# Patient Record
Sex: Male | Born: 1940 | Race: White | Hispanic: No | Marital: Married | Smoking: Former smoker
Health system: Southern US, Community
[De-identification: ages and names within clinical notes are randomized; demographics above are authoritative.]

## PROBLEM LIST (undated history)

## (undated) DIAGNOSIS — C4491 Basal cell carcinoma of skin, unspecified: Secondary | ICD-10-CM

## (undated) DIAGNOSIS — K279 Peptic ulcer, site unspecified, unspecified as acute or chronic, without hemorrhage or perforation: Secondary | ICD-10-CM

## (undated) DIAGNOSIS — F1910 Other psychoactive substance abuse, uncomplicated: Secondary | ICD-10-CM

## (undated) DIAGNOSIS — Z720 Tobacco use: Secondary | ICD-10-CM

## (undated) DIAGNOSIS — I4891 Unspecified atrial fibrillation: Secondary | ICD-10-CM

## (undated) DIAGNOSIS — F419 Anxiety disorder, unspecified: Secondary | ICD-10-CM

## (undated) DIAGNOSIS — I340 Nonrheumatic mitral (valve) insufficiency: Secondary | ICD-10-CM

## (undated) DIAGNOSIS — I10 Essential (primary) hypertension: Secondary | ICD-10-CM

## (undated) DIAGNOSIS — R0602 Shortness of breath: Secondary | ICD-10-CM

## (undated) DIAGNOSIS — I509 Heart failure, unspecified: Secondary | ICD-10-CM

## (undated) DIAGNOSIS — M1990 Unspecified osteoarthritis, unspecified site: Secondary | ICD-10-CM

## (undated) DIAGNOSIS — I428 Other cardiomyopathies: Secondary | ICD-10-CM

## (undated) DIAGNOSIS — K4090 Unilateral inguinal hernia, without obstruction or gangrene, not specified as recurrent: Secondary | ICD-10-CM

## (undated) DIAGNOSIS — I499 Cardiac arrhythmia, unspecified: Secondary | ICD-10-CM

## (undated) DIAGNOSIS — J189 Pneumonia, unspecified organism: Secondary | ICD-10-CM

## (undated) DIAGNOSIS — S42301A Unspecified fracture of shaft of humerus, right arm, initial encounter for closed fracture: Secondary | ICD-10-CM

## (undated) DIAGNOSIS — C8290 Follicular lymphoma, unspecified, unspecified site: Principal | ICD-10-CM

## (undated) DIAGNOSIS — J449 Chronic obstructive pulmonary disease, unspecified: Secondary | ICD-10-CM

## (undated) HISTORY — DX: Anxiety disorder, unspecified: F41.9

## (undated) HISTORY — PX: ROTATOR CUFF REPAIR: SHX139

## (undated) HISTORY — DX: Tobacco use: Z72.0

## (undated) HISTORY — DX: Other cardiomyopathies: I42.8

## (undated) HISTORY — DX: Unspecified atrial fibrillation: I48.91

## (undated) HISTORY — DX: Unspecified osteoarthritis, unspecified site: M19.90

## (undated) HISTORY — DX: Follicular lymphoma, unspecified, unspecified site: C82.90

## (undated) HISTORY — DX: Unspecified fracture of shaft of humerus, right arm, initial encounter for closed fracture: S42.301A

## (undated) HISTORY — DX: Basal cell carcinoma of skin, unspecified: C44.91

## (undated) HISTORY — DX: Essential (primary) hypertension: I10

## (undated) HISTORY — DX: Other psychoactive substance abuse, uncomplicated: F19.10

## (undated) HISTORY — PX: SKIN CANCER EXCISION: SHX779

## (undated) HISTORY — DX: Pneumonia, unspecified organism: J18.9

## (undated) HISTORY — PX: OTHER SURGICAL HISTORY: SHX169

## (undated) HISTORY — DX: Nonrheumatic mitral (valve) insufficiency: I34.0

## (undated) HISTORY — DX: Unilateral inguinal hernia, without obstruction or gangrene, not specified as recurrent: K40.90

## (undated) HISTORY — PX: HEMATOMA EVACUATION: SHX5118

## (undated) HISTORY — DX: Peptic ulcer, site unspecified, unspecified as acute or chronic, without hemorrhage or perforation: K27.9

---

## 1973-03-24 HISTORY — PX: PATELLA FRACTURE SURGERY: SHX735

## 2004-11-12 ENCOUNTER — Ambulatory Visit (HOSPITAL_COMMUNITY): Admission: RE | Admit: 2004-11-12 | Discharge: 2004-11-12 | Payer: Self-pay | Admitting: Family Medicine

## 2005-01-27 ENCOUNTER — Emergency Department (HOSPITAL_COMMUNITY): Admission: EM | Admit: 2005-01-27 | Discharge: 2005-01-27 | Payer: Self-pay | Admitting: Family Medicine

## 2005-02-02 ENCOUNTER — Inpatient Hospital Stay (HOSPITAL_COMMUNITY): Admission: EM | Admit: 2005-02-02 | Discharge: 2005-02-05 | Payer: Self-pay | Admitting: Emergency Medicine

## 2005-02-04 ENCOUNTER — Encounter (INDEPENDENT_AMBULATORY_CARE_PROVIDER_SITE_OTHER): Payer: Self-pay | Admitting: Internal Medicine

## 2005-02-04 ENCOUNTER — Ambulatory Visit: Payer: Self-pay | Admitting: Internal Medicine

## 2005-03-03 ENCOUNTER — Other Ambulatory Visit: Admission: RE | Admit: 2005-03-03 | Discharge: 2005-03-03 | Payer: Self-pay | Admitting: Dermatology

## 2005-03-06 ENCOUNTER — Ambulatory Visit: Payer: Self-pay | Admitting: Internal Medicine

## 2005-04-03 ENCOUNTER — Other Ambulatory Visit: Admission: RE | Admit: 2005-04-03 | Discharge: 2005-04-03 | Payer: Self-pay | Admitting: Dermatology

## 2005-04-09 ENCOUNTER — Ambulatory Visit (HOSPITAL_COMMUNITY): Admission: RE | Admit: 2005-04-09 | Discharge: 2005-04-09 | Payer: Self-pay | Admitting: Internal Medicine

## 2005-04-09 ENCOUNTER — Ambulatory Visit: Payer: Self-pay | Admitting: Internal Medicine

## 2005-06-18 ENCOUNTER — Ambulatory Visit (HOSPITAL_COMMUNITY): Admission: RE | Admit: 2005-06-18 | Discharge: 2005-06-18 | Payer: Self-pay | Admitting: Orthopaedic Surgery

## 2005-12-05 ENCOUNTER — Ambulatory Visit (HOSPITAL_COMMUNITY): Admission: RE | Admit: 2005-12-05 | Discharge: 2005-12-05 | Payer: Self-pay | Admitting: Orthopaedic Surgery

## 2007-03-25 HISTORY — PX: COLONOSCOPY: SHX174

## 2007-08-19 ENCOUNTER — Ambulatory Visit (HOSPITAL_COMMUNITY): Admission: RE | Admit: 2007-08-19 | Discharge: 2007-08-19 | Payer: Self-pay | Admitting: Family Medicine

## 2007-11-12 ENCOUNTER — Ambulatory Visit (HOSPITAL_COMMUNITY): Admission: RE | Admit: 2007-11-12 | Discharge: 2007-11-12 | Payer: Self-pay | Admitting: Orthopaedic Surgery

## 2008-01-18 ENCOUNTER — Ambulatory Visit (HOSPITAL_COMMUNITY): Admission: RE | Admit: 2008-01-18 | Discharge: 2008-01-18 | Payer: Self-pay | Admitting: Orthopaedic Surgery

## 2008-01-25 ENCOUNTER — Inpatient Hospital Stay (HOSPITAL_COMMUNITY): Admission: RE | Admit: 2008-01-25 | Discharge: 2008-01-27 | Payer: Self-pay | Admitting: Orthopedic Surgery

## 2008-03-24 DIAGNOSIS — J189 Pneumonia, unspecified organism: Secondary | ICD-10-CM

## 2008-03-24 HISTORY — DX: Pneumonia, unspecified organism: J18.9

## 2008-05-11 ENCOUNTER — Encounter (INDEPENDENT_AMBULATORY_CARE_PROVIDER_SITE_OTHER): Payer: Self-pay | Admitting: *Deleted

## 2008-05-11 LAB — CONVERTED CEMR LAB
AST: 12 units/L
Alkaline Phosphatase: 81 units/L
CO2: 24 meq/L
Cholesterol: 197 mg/dL
Creatinine, Ser: 0.97 mg/dL
Glucose, Bld: 101 mg/dL
LDL Cholesterol: 115 mg/dL
Triglycerides: 161 mg/dL

## 2008-07-26 ENCOUNTER — Other Ambulatory Visit: Payer: Self-pay | Admitting: Plastic Surgery

## 2008-07-31 ENCOUNTER — Encounter (INDEPENDENT_AMBULATORY_CARE_PROVIDER_SITE_OTHER): Payer: Self-pay | Admitting: Plastic Surgery

## 2008-07-31 ENCOUNTER — Other Ambulatory Visit: Payer: Self-pay | Admitting: Plastic Surgery

## 2008-08-01 ENCOUNTER — Other Ambulatory Visit: Payer: Self-pay | Admitting: Plastic Surgery

## 2008-08-01 ENCOUNTER — Inpatient Hospital Stay (HOSPITAL_COMMUNITY): Admission: AD | Admit: 2008-08-01 | Discharge: 2008-08-02 | Payer: Self-pay | Admitting: Plastic Surgery

## 2008-09-29 ENCOUNTER — Ambulatory Visit (HOSPITAL_COMMUNITY): Admission: RE | Admit: 2008-09-29 | Discharge: 2008-09-29 | Payer: Self-pay | Admitting: Orthopaedic Surgery

## 2008-10-06 ENCOUNTER — Emergency Department (HOSPITAL_COMMUNITY): Admission: EM | Admit: 2008-10-06 | Discharge: 2008-10-06 | Payer: Self-pay | Admitting: Emergency Medicine

## 2008-10-22 DIAGNOSIS — I4891 Unspecified atrial fibrillation: Secondary | ICD-10-CM

## 2008-10-22 HISTORY — DX: Unspecified atrial fibrillation: I48.91

## 2008-10-30 ENCOUNTER — Ambulatory Visit: Payer: Self-pay | Admitting: Cardiology

## 2008-10-30 ENCOUNTER — Inpatient Hospital Stay (HOSPITAL_COMMUNITY): Admission: AD | Admit: 2008-10-30 | Discharge: 2008-11-02 | Payer: Self-pay | Admitting: Family Medicine

## 2008-10-30 LAB — CONVERTED CEMR LAB
ALT: 13 units/L
AST: 27 units/L
Albumin: 3.9 g/dL
Alkaline Phosphatase: 72 units/L
BUN: 13 mg/dL
Bilirubin, Direct: 0.3 mg/dL
CK-MB: 2.8 ng/mL
CO2: 26 meq/L
Calcium: 9.5 mg/dL
Chloride: 105 meq/L
Creatinine, Ser: 0.87 mg/dL
GFR calc non Af Amer: >60 mL/min
Glomerular Filtration Rate, Af Am: >60 mL/min/{1.73_m2}
Glucose, Bld: 170 mg/dL
HCT: 37.5 %
Hemoglobin: 12.9 g/dL
MCV: 85.2 fL
Platelets: 227 10*3/uL
Potassium: 3.4 meq/L
Sodium: 137 meq/L
TSH: 0.311 microintl units/mL
Total CK: 201 units/L
Total Protein: 7.1 g/dL
Troponin I: 0.05 ng/mL
WBC: 7.1 10*3/uL

## 2008-10-31 ENCOUNTER — Encounter: Payer: Self-pay | Admitting: Cardiology

## 2008-10-31 ENCOUNTER — Encounter (INDEPENDENT_AMBULATORY_CARE_PROVIDER_SITE_OTHER): Payer: Self-pay | Admitting: Family Medicine

## 2008-11-01 ENCOUNTER — Encounter (INDEPENDENT_AMBULATORY_CARE_PROVIDER_SITE_OTHER): Payer: Self-pay | Admitting: *Deleted

## 2008-11-01 LAB — CONVERTED CEMR LAB
Free T4: 0.9 ng/dL
Free T4: 0.9 ng/dL
T3, Free: 2.9 pg/mL
TSH: 0.484 microintl units/mL

## 2008-11-02 ENCOUNTER — Encounter: Payer: Self-pay | Admitting: Cardiology

## 2008-11-02 LAB — CONVERTED CEMR LAB
INR: 1.1
Prothrombin Time: 14.3 s

## 2008-11-06 ENCOUNTER — Ambulatory Visit: Payer: Self-pay

## 2008-11-06 LAB — CONVERTED CEMR LAB: POC INR: 1.1

## 2008-11-08 DIAGNOSIS — J309 Allergic rhinitis, unspecified: Secondary | ICD-10-CM | POA: Insufficient documentation

## 2008-11-08 DIAGNOSIS — I10 Essential (primary) hypertension: Secondary | ICD-10-CM | POA: Insufficient documentation

## 2008-11-09 LAB — CONVERTED CEMR LAB: POC INR: 2.4

## 2008-11-10 ENCOUNTER — Ambulatory Visit: Payer: Self-pay

## 2008-11-13 ENCOUNTER — Ambulatory Visit: Payer: Self-pay

## 2008-11-13 LAB — CONVERTED CEMR LAB: POC INR: 3.5

## 2008-11-22 ENCOUNTER — Ambulatory Visit: Payer: Self-pay | Admitting: Cardiology

## 2008-11-22 ENCOUNTER — Encounter: Payer: Self-pay | Admitting: Cardiology

## 2008-11-22 DIAGNOSIS — Z8711 Personal history of peptic ulcer disease: Secondary | ICD-10-CM

## 2008-11-22 DIAGNOSIS — E079 Disorder of thyroid, unspecified: Secondary | ICD-10-CM | POA: Insufficient documentation

## 2008-11-22 DIAGNOSIS — G4700 Insomnia, unspecified: Secondary | ICD-10-CM

## 2008-11-22 DIAGNOSIS — F17200 Nicotine dependence, unspecified, uncomplicated: Secondary | ICD-10-CM | POA: Insufficient documentation

## 2008-11-22 LAB — CONVERTED CEMR LAB: POC INR: 2.7

## 2008-11-23 ENCOUNTER — Encounter: Payer: Self-pay | Admitting: Cardiology

## 2008-11-30 ENCOUNTER — Ambulatory Visit: Payer: Self-pay | Admitting: Cardiovascular Disease

## 2008-11-30 LAB — CONVERTED CEMR LAB: POC INR: 1.6

## 2008-12-02 ENCOUNTER — Emergency Department (HOSPITAL_COMMUNITY): Admission: EM | Admit: 2008-12-02 | Discharge: 2008-12-02 | Payer: Self-pay | Admitting: Emergency Medicine

## 2008-12-04 ENCOUNTER — Telehealth (INDEPENDENT_AMBULATORY_CARE_PROVIDER_SITE_OTHER): Payer: Self-pay | Admitting: *Deleted

## 2008-12-11 ENCOUNTER — Ambulatory Visit: Payer: Self-pay | Admitting: Cardiology

## 2008-12-11 LAB — CONVERTED CEMR LAB: POC INR: 2.1

## 2008-12-25 ENCOUNTER — Ambulatory Visit: Payer: Self-pay | Admitting: Cardiology

## 2008-12-25 LAB — CONVERTED CEMR LAB: POC INR: 2.2

## 2009-01-05 ENCOUNTER — Telehealth (INDEPENDENT_AMBULATORY_CARE_PROVIDER_SITE_OTHER): Payer: Self-pay | Admitting: *Deleted

## 2009-01-15 ENCOUNTER — Ambulatory Visit: Payer: Self-pay | Admitting: Cardiology

## 2009-01-15 LAB — CONVERTED CEMR LAB: POC INR: 2.3

## 2009-02-12 ENCOUNTER — Ambulatory Visit: Payer: Self-pay | Admitting: Cardiology

## 2009-02-12 LAB — CONVERTED CEMR LAB: POC INR: 2.6

## 2009-02-21 DIAGNOSIS — I428 Other cardiomyopathies: Secondary | ICD-10-CM

## 2009-02-21 HISTORY — DX: Other cardiomyopathies: I42.8

## 2009-03-12 ENCOUNTER — Ambulatory Visit: Payer: Self-pay | Admitting: Cardiology

## 2009-03-12 LAB — CONVERTED CEMR LAB: POC INR: 2.3

## 2009-03-15 ENCOUNTER — Inpatient Hospital Stay (HOSPITAL_COMMUNITY): Admission: RE | Admit: 2009-03-15 | Discharge: 2009-03-22 | Payer: Self-pay | Admitting: Family Medicine

## 2009-03-16 ENCOUNTER — Encounter (INDEPENDENT_AMBULATORY_CARE_PROVIDER_SITE_OTHER): Payer: Self-pay | Admitting: Family Medicine

## 2009-03-16 ENCOUNTER — Ambulatory Visit: Payer: Self-pay | Admitting: Cardiology

## 2009-03-16 ENCOUNTER — Other Ambulatory Visit: Payer: Self-pay | Admitting: Cardiology

## 2009-03-17 ENCOUNTER — Other Ambulatory Visit: Payer: Self-pay | Admitting: Cardiology

## 2009-03-18 ENCOUNTER — Other Ambulatory Visit: Payer: Self-pay | Admitting: Cardiology

## 2009-03-19 ENCOUNTER — Other Ambulatory Visit: Payer: Self-pay | Admitting: Cardiology

## 2009-03-19 ENCOUNTER — Encounter: Payer: Self-pay | Admitting: Cardiology

## 2009-03-20 ENCOUNTER — Other Ambulatory Visit: Payer: Self-pay | Admitting: Cardiology

## 2009-03-21 ENCOUNTER — Other Ambulatory Visit: Payer: Self-pay | Admitting: Cardiology

## 2009-03-22 ENCOUNTER — Other Ambulatory Visit: Payer: Self-pay | Admitting: Cardiology

## 2009-03-22 ENCOUNTER — Encounter (INDEPENDENT_AMBULATORY_CARE_PROVIDER_SITE_OTHER): Payer: Self-pay

## 2009-03-22 ENCOUNTER — Other Ambulatory Visit: Payer: Self-pay

## 2009-03-22 LAB — CONVERTED CEMR LAB
BUN: 21 mg/dL
CO2: 30 meq/L
Calcium: 9.4 mg/dL
Chloride: 101 meq/L
Creatinine, Ser: 1.02 mg/dL
GFR calc non Af Amer: >60 mL/min
Glomerular Filtration Rate, Af Am: >60 mL/min/{1.73_m2}
Glucose, Bld: 109 mg/dL
HCT: 38.8 %
Hemoglobin: 12.8 g/dL
INR: 1.33
MCV: 81.9 fL
Platelets: 291 10*3/uL
Potassium: 4.5 meq/L
Prothrombin Time: 16.4 s
Sodium: 137 meq/L
WBC: 8.4 10*3/uL

## 2009-03-28 ENCOUNTER — Encounter: Payer: Self-pay | Admitting: Adult Health

## 2009-03-28 ENCOUNTER — Ambulatory Visit: Payer: Self-pay | Admitting: Cardiology

## 2009-03-28 ENCOUNTER — Encounter (INDEPENDENT_AMBULATORY_CARE_PROVIDER_SITE_OTHER): Payer: Self-pay

## 2009-03-28 LAB — CONVERTED CEMR LAB: POC INR: 2.2

## 2009-04-04 ENCOUNTER — Encounter (INDEPENDENT_AMBULATORY_CARE_PROVIDER_SITE_OTHER): Payer: Self-pay | Admitting: *Deleted

## 2009-04-04 ENCOUNTER — Ambulatory Visit: Payer: Self-pay | Admitting: Cardiology

## 2009-04-09 ENCOUNTER — Telehealth (INDEPENDENT_AMBULATORY_CARE_PROVIDER_SITE_OTHER): Payer: Self-pay | Admitting: *Deleted

## 2009-04-11 ENCOUNTER — Ambulatory Visit: Payer: Self-pay | Admitting: Cardiology

## 2009-04-11 LAB — CONVERTED CEMR LAB: POC INR: 2.4

## 2009-04-26 ENCOUNTER — Encounter (INDEPENDENT_AMBULATORY_CARE_PROVIDER_SITE_OTHER): Payer: Self-pay | Admitting: *Deleted

## 2009-04-26 ENCOUNTER — Encounter: Payer: Self-pay | Admitting: Cardiology

## 2009-04-27 ENCOUNTER — Encounter (INDEPENDENT_AMBULATORY_CARE_PROVIDER_SITE_OTHER): Payer: Self-pay | Admitting: *Deleted

## 2009-04-27 LAB — CONVERTED CEMR LAB
Chloride: 106 meq/L (ref 96–112)
HDL: 34 mg/dL — ABNORMAL LOW (ref >39)
LDL Cholesterol: 113 mg/dL — ABNORMAL HIGH (ref 0–99)
Potassium: 4.5 meq/L (ref 3.5–5.3)
Total CHOL/HDL Ratio: 5.4
Triglycerides: 184 mg/dL — ABNORMAL HIGH (ref <150)
VLDL: 37 mg/dL (ref 0–40)

## 2009-05-08 ENCOUNTER — Ambulatory Visit: Payer: Self-pay | Admitting: Cardiology

## 2009-05-08 ENCOUNTER — Encounter (INDEPENDENT_AMBULATORY_CARE_PROVIDER_SITE_OTHER): Payer: Self-pay | Admitting: *Deleted

## 2009-05-14 ENCOUNTER — Ambulatory Visit: Payer: Self-pay | Admitting: Cardiology

## 2009-05-14 ENCOUNTER — Encounter: Payer: Self-pay | Admitting: *Deleted

## 2009-05-21 ENCOUNTER — Ambulatory Visit: Payer: Self-pay | Admitting: Cardiology

## 2009-05-24 ENCOUNTER — Ambulatory Visit: Payer: Self-pay | Admitting: Cardiology

## 2009-05-25 LAB — CONVERTED CEMR LAB
HDL: 32 mg/dL — ABNORMAL LOW (ref >39)
LDL Cholesterol: 41 mg/dL (ref 0–99)
Total CHOL/HDL Ratio: 3.6
Triglycerides: 216 mg/dL — ABNORMAL HIGH (ref <150)
VLDL: 43 mg/dL — ABNORMAL HIGH (ref 0–40)

## 2009-05-28 ENCOUNTER — Ambulatory Visit: Payer: Self-pay | Admitting: Cardiology

## 2009-05-28 LAB — CONVERTED CEMR LAB: POC INR: 2

## 2009-05-29 ENCOUNTER — Encounter (INDEPENDENT_AMBULATORY_CARE_PROVIDER_SITE_OTHER): Payer: Self-pay | Admitting: *Deleted

## 2009-06-04 ENCOUNTER — Ambulatory Visit: Payer: Self-pay | Admitting: Cardiology

## 2009-06-04 LAB — CONVERTED CEMR LAB: POC INR: 3.4

## 2009-06-07 ENCOUNTER — Ambulatory Visit: Payer: Self-pay | Admitting: Cardiology

## 2009-06-07 ENCOUNTER — Encounter (INDEPENDENT_AMBULATORY_CARE_PROVIDER_SITE_OTHER): Payer: Self-pay | Admitting: *Deleted

## 2009-06-07 LAB — CONVERTED CEMR LAB
ALT: 12 units/L (ref 0–53)
AST: 14 units/L
AST: 14 units/L (ref 0–37)
Albumin: 4.5 g/dL
Albumin: 4.5 g/dL (ref 3.5–5.2)
Alkaline Phosphatase: 80 units/L
Alkaline Phosphatase: 80 units/L (ref 39–117)
Basophils Absolute: 0.1 10*3/uL (ref 0.0–0.1)
Basophils Relative: 1 % (ref 0–1)
CO2: 25 meq/L
Creatinine, Ser: 1 mg/dL
HCT: 42 %
Hemoglobin: 13.1 g/dL
Hemoglobin: 13.1 g/dL (ref 13.0–17.0)
Lymphocytes Relative: 24 % (ref 12–46)
MCHC: 31.2 g/dL (ref 30.0–36.0)
Monocytes Absolute: 0.8 10*3/uL (ref 0.1–1.0)
Neutro Abs: 4.3 10*3/uL (ref 1.7–7.7)
Neutrophils Relative %: 61 % (ref 43–77)
POC INR: 4.4
Platelets: 275 10*3/uL
Platelets: 275 10*3/uL (ref 150–400)
Potassium: 4.9 meq/L (ref 3.5–5.3)
RDW: 18.6 % — ABNORMAL HIGH (ref 11.5–15.5)
Sodium: 140 meq/L
Sodium: 140 meq/L (ref 135–145)
Total Bilirubin: 0.4 mg/dL (ref 0.3–1.2)
Total Protein: 7.7 g/dL
Total Protein: 7.7 g/dL (ref 6.0–8.3)
WBC: 7.1 10*3/uL

## 2009-06-08 ENCOUNTER — Encounter: Payer: Self-pay | Admitting: Cardiology

## 2009-06-11 ENCOUNTER — Ambulatory Visit: Payer: Self-pay | Admitting: Cardiology

## 2009-06-12 ENCOUNTER — Observation Stay (HOSPITAL_COMMUNITY): Admission: AD | Admit: 2009-06-12 | Discharge: 2009-06-12 | Payer: Self-pay | Admitting: Cardiology

## 2009-06-13 ENCOUNTER — Encounter (INDEPENDENT_AMBULATORY_CARE_PROVIDER_SITE_OTHER): Payer: Self-pay | Admitting: *Deleted

## 2009-06-14 ENCOUNTER — Ambulatory Visit: Payer: Self-pay | Admitting: Cardiology

## 2009-06-14 LAB — CONVERTED CEMR LAB: POC INR: 2.5

## 2009-06-21 ENCOUNTER — Ambulatory Visit: Payer: Self-pay | Admitting: Cardiology

## 2009-06-28 ENCOUNTER — Ambulatory Visit: Payer: Self-pay | Admitting: Cardiology

## 2009-06-28 LAB — CONVERTED CEMR LAB: POC INR: 3.2

## 2009-07-04 ENCOUNTER — Encounter (INDEPENDENT_AMBULATORY_CARE_PROVIDER_SITE_OTHER): Payer: Self-pay | Admitting: *Deleted

## 2009-07-05 ENCOUNTER — Ambulatory Visit: Payer: Self-pay | Admitting: Cardiology

## 2009-07-19 ENCOUNTER — Ambulatory Visit: Payer: Self-pay | Admitting: Cardiology

## 2009-07-19 LAB — CONVERTED CEMR LAB: POC INR: 2.3

## 2009-07-20 ENCOUNTER — Encounter: Payer: Self-pay | Admitting: Cardiology

## 2009-07-30 ENCOUNTER — Ambulatory Visit (HOSPITAL_COMMUNITY): Admission: RE | Admit: 2009-07-30 | Discharge: 2009-07-30 | Payer: Self-pay | Admitting: Cardiology

## 2009-07-30 ENCOUNTER — Ambulatory Visit: Payer: Self-pay | Admitting: Cardiology

## 2009-07-30 ENCOUNTER — Encounter: Payer: Self-pay | Admitting: Cardiology

## 2009-08-01 ENCOUNTER — Encounter (INDEPENDENT_AMBULATORY_CARE_PROVIDER_SITE_OTHER): Payer: Self-pay | Admitting: *Deleted

## 2009-08-09 ENCOUNTER — Ambulatory Visit: Payer: Self-pay | Admitting: Cardiology

## 2009-08-09 LAB — CONVERTED CEMR LAB: POC INR: 2.2

## 2009-09-03 ENCOUNTER — Ambulatory Visit (HOSPITAL_COMMUNITY): Admission: RE | Admit: 2009-09-03 | Discharge: 2009-09-03 | Payer: Self-pay | Admitting: Orthopaedic Surgery

## 2009-09-06 ENCOUNTER — Ambulatory Visit: Payer: Self-pay | Admitting: Cardiology

## 2009-09-06 LAB — CONVERTED CEMR LAB: POC INR: 2.2

## 2009-09-27 ENCOUNTER — Encounter (INDEPENDENT_AMBULATORY_CARE_PROVIDER_SITE_OTHER): Payer: Self-pay | Admitting: *Deleted

## 2009-10-03 ENCOUNTER — Ambulatory Visit: Payer: Self-pay | Admitting: Cardiology

## 2009-10-04 ENCOUNTER — Ambulatory Visit: Payer: Self-pay | Admitting: Cardiology

## 2009-10-04 ENCOUNTER — Encounter (INDEPENDENT_AMBULATORY_CARE_PROVIDER_SITE_OTHER): Payer: Self-pay | Admitting: *Deleted

## 2009-10-04 LAB — CONVERTED CEMR LAB
CO2: 25 meq/L
Calcium: 9.4 mg/dL
Chloride: 107 meq/L
Creatinine, Ser: 0.95 mg/dL
Glucose, Bld: 92 mg/dL

## 2009-10-05 ENCOUNTER — Encounter: Payer: Self-pay | Admitting: Cardiology

## 2009-10-05 LAB — CONVERTED CEMR LAB
BUN: 17 mg/dL (ref 6–23)
Chloride: 107 meq/L (ref 96–112)
Potassium: 4.9 meq/L (ref 3.5–5.3)
Sodium: 143 meq/L (ref 135–145)

## 2009-10-24 ENCOUNTER — Ambulatory Visit: Payer: Self-pay | Admitting: Cardiology

## 2009-10-24 LAB — CONVERTED CEMR LAB: POC INR: 2

## 2009-11-02 ENCOUNTER — Ambulatory Visit: Payer: Self-pay | Admitting: Cardiology

## 2009-11-19 ENCOUNTER — Ambulatory Visit: Payer: Self-pay | Admitting: Cardiology

## 2009-11-19 LAB — CONVERTED CEMR LAB: POC INR: 2.4

## 2009-12-17 ENCOUNTER — Ambulatory Visit: Payer: Self-pay | Admitting: Cardiology

## 2009-12-17 LAB — CONVERTED CEMR LAB: POC INR: 2.2

## 2010-01-14 ENCOUNTER — Ambulatory Visit: Payer: Self-pay | Admitting: Cardiology

## 2010-01-14 LAB — CONVERTED CEMR LAB: POC INR: 2.5

## 2010-01-23 ENCOUNTER — Encounter (INDEPENDENT_AMBULATORY_CARE_PROVIDER_SITE_OTHER): Payer: Self-pay | Admitting: *Deleted

## 2010-01-23 ENCOUNTER — Ambulatory Visit: Payer: Self-pay | Admitting: Cardiology

## 2010-01-23 LAB — CONVERTED CEMR LAB: OCCULT 1: NEGATIVE

## 2010-01-25 ENCOUNTER — Encounter (INDEPENDENT_AMBULATORY_CARE_PROVIDER_SITE_OTHER): Payer: Self-pay | Admitting: *Deleted

## 2010-02-05 ENCOUNTER — Telehealth (INDEPENDENT_AMBULATORY_CARE_PROVIDER_SITE_OTHER): Payer: Self-pay | Admitting: *Deleted

## 2010-02-11 ENCOUNTER — Ambulatory Visit: Payer: Self-pay | Admitting: Cardiology

## 2010-03-11 ENCOUNTER — Encounter (INDEPENDENT_AMBULATORY_CARE_PROVIDER_SITE_OTHER): Payer: Self-pay | Admitting: *Deleted

## 2010-03-11 ENCOUNTER — Ambulatory Visit: Payer: Self-pay | Admitting: Cardiology

## 2010-03-12 ENCOUNTER — Encounter (INDEPENDENT_AMBULATORY_CARE_PROVIDER_SITE_OTHER): Payer: Self-pay | Admitting: *Deleted

## 2010-03-12 ENCOUNTER — Ambulatory Visit: Payer: Self-pay | Admitting: Cardiology

## 2010-03-14 ENCOUNTER — Encounter: Payer: Self-pay | Admitting: Cardiology

## 2010-04-01 ENCOUNTER — Ambulatory Visit: Admission: RE | Admit: 2010-04-01 | Discharge: 2010-04-01 | Payer: Self-pay | Source: Home / Self Care

## 2010-04-01 LAB — CONVERTED CEMR LAB: POC INR: 3.1

## 2010-04-02 ENCOUNTER — Telehealth (INDEPENDENT_AMBULATORY_CARE_PROVIDER_SITE_OTHER): Payer: Self-pay | Admitting: *Deleted

## 2010-04-13 ENCOUNTER — Encounter: Payer: Self-pay | Admitting: Family Medicine

## 2010-04-14 ENCOUNTER — Encounter: Payer: Self-pay | Admitting: Orthopaedic Surgery

## 2010-04-14 ENCOUNTER — Encounter: Payer: Self-pay | Admitting: Family Medicine

## 2010-04-15 ENCOUNTER — Encounter: Payer: Self-pay | Admitting: Orthopaedic Surgery

## 2010-04-23 NOTE — Assessment & Plan Note (Signed)
Summary: per matt coumadin visit also at appt/sn   Visit Type:  Follow-up Primary Provider:  Dr.Knowlton  CC:  Post discharge follow-up.  History of Present Illness: [name] is a pleasant 70 male with known history of of mixed CHF, nonischemic CM, with EF of 25%-30% and grade 2 diastolic dysfunction, CAD and chronic Afib.  He was recently hospitalized in Dec 2010 for Afib RVR and CHF.   He was diuresed with IV diuretics and transitioned to by mouth p.o.  His cardiazem was increased from 240mg  daily to 300mg  daily.  Since discharge the patient has gained approx 10lbs.  He has admitted to dietary noncompliance concerning salt intake.  Although not excessive, he has eaten sausage, crackers, [place] food.  He denies =SOB, CP.  Heart palpatations.  He is unaware of his irregular rhythum or when his HR is up.  He continues on coumadin and is followed in the coumadin clinic at the [place] office.   He does not weigh himself daily.  Problems Prior to Update: 1)  Atrial Fibrillation, Paroxysmal  (ICD-427.31) 2)  Hypertension  (ICD-401.9) 3)  Tobacco Abuse  (ICD-305.1) 4)  Allergic Rhinitis  (ICD-477.9) 5)  Insomnia, Chronic  (ICD-307.42) 6)  Depression & Anxiety  (ICD-311) 7)  Carcinoma, Basal Cell  (ICD-173.9) 8)  Thyroid Stimulating Hormone, Abnormal  (ICD-246.9) 9)  Gastric Ulcer, Hx of  (ICD-V12.71)  Current Medications (verified): 1)  Hydrocodone-Acetaminophen 7.5-650 Mg Tabs (Hydrocodone-Acetaminophen) .... Take 1 Tab Four Times A Day 2)  Warfarin Sodium 5 Mg Tabs (Warfarin Sodium) .... Take As Directed 3)  Digoxin 0.25 Mg Tabs (Digoxin) .... Take 1 Tab Daily 4)  Diltiazem Hcl Er Beads 360 Mg Xr24h-Cap (Diltiazem Hcl Er Beads) .... Take 1 Tablet By Mouth Once Daily 5)  Furosemide 40 Mg Tabs (Furosemide) .... Take 1 Tab Daily 6)  Klor-Con 20 Meq Pack (Potassium Chloride) .... Take 1 Tab Daily 7)  Alprazolam 1 Mg Tabs (Alprazolam) .... Take 1 Tab Daily 8)  Omeprazole 20 Mg Cpdr  (Omeprazole) .... Take 1 Tab Two Times A Day 9)  Tramadol Hcl 50 Mg Tabs (Tramadol Hcl) .... Take As Directed 10)  Warfarin Sodium 5 Mg Tabs (Warfarin Sodium) .... Take As Directed  Allergies (verified): 1)  ! Sulfa  Past History:  Past medical, surgical, family and social histories (including risk factors) reviewed, and no changes noted (except as noted below).  Past Medical History: Reviewed history from 11/22/2008 and no changes required. 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 Excision of large basal cell carcinoma-left chest Surgical intervention in right shoulder due to  tenosynovitis  Family History: Reviewed history from 11/08/2008 and no changes required. Father: Mother: Siblings:  Social History: Reviewed history from 11/08/2008 and no changes required. Full Time Married  Tobacco Use - Yes.  Alcohol Use - no Regular Exercise - no Drug Use - yes  Review of Systems       The patient complains of weight gain.         All other systems have been reviewed and are negative unless stated above.   Vital Signs:  Patient profile:   70 year old male Weight:      220 pounds BMI:     34.58 Pulse rate:  70 / minute BP sitting:   146 / 73  (right arm)  Vitals Entered By: Dreama Saa, CNA (March 28, 2009 1:38 PM)  Physical Exam  General:  Well developed, well nourished, in no acute distress. Neck:  Neck supple, no JVD. No masses, thyromegaly or abnormal cervical nodes. Lungs:  Clear bilaterally to auscultation and percussion. Heart:  Tachycardic and irregular.   Abdomen:   Bowel sounds positive; abdomen soft and non-tender without masses, organomegaly, or hernias noted. No hepatosplenomegaly. Msk:  Back normal, normal gait. Muscle strength and tone normal. Extremities:  1+ pretibial, 2+ ankle edema. Neurologic:  Alert and oriented x 3. Psych:  Normal affect.   EKG  Procedure date:  03/28/2009  Findings:      Atrial fibrillation with a uncontrolled ventricular response rate of: 101  Impression & Recommendations:  Problem # 1:  CAD, NATIVE VESSEL (ICD-414.01) Assessment Unchanged He remains pain free and without complaint of chest discomfort. The following medications were removed from the medication list:    Diltiazem Hcl Er Beads 240 Mg Xr24h-cap (Diltiazem hcl er beads) .Marland Kitchen... Take 1 cap daily His updated medication list for this problem includes:    Warfarin Sodium 5 Mg Tabs (Warfarin sodium) .Marland Kitchen... Take as directed    Diltiazem Hcl Er Beads 360 Mg Xr24h-cap (Diltiazem hcl er beads) .Marland Kitchen... Take 1 tablet by mouth once daily  Problem # 2:  ATRIAL FIBRILLATION, PAROXYSMAL (ICD-427.31) Rate is not controlled at present on Dig and Diltiazem 300mg .  I will increase his Dilt to 360mg  daily. for better HR control. His updated medication list for this problem includes:    Warfarin Sodium 5 Mg Tabs (Warfarin sodium) .Marland Kitchen... Take as directed    Digoxin 0.25 Mg Tabs (Digoxin) .Marland Kitchen... Take 1 tab daily  Problem # 3:  CHRONIC SYSTOLIC HEART FAILURE (ICD-428.22) [name] is advised on a low salt diet and provided the DASH diet as a reference.  He is advised to by a digital scale and weigh himself daily.  His last EF was 25%-30% per hospitalization.  I would like him to take one extra dose of Lasix and K-Dur today only.  May need to place him on Coreg 3.125 two times a day on next visit.  Will evaluate weight and compliance at that time. The following medications were removed from the medication list:    Diltiazem Hcl Er Beads 240 Mg Xr24h-cap (Diltiazem hcl er beads)  .Marland Kitchen... Take 1 cap daily His updated medication list for this problem includes:    Warfarin Sodium 5 Mg Tabs (Warfarin sodium) .Marland Kitchen... Take as directed    Digoxin 0.25 Mg Tabs (Digoxin) .Marland Kitchen... Take 1 tab daily    Diltiazem Hcl Er Beads 360 Mg Xr24h-cap (Diltiazem hcl er beads) .Marland Kitchen... Take 1 tablet by mouth once daily    Furosemide 40 Mg Tabs (Furosemide) .Marland Kitchen... Take 1 tab daily  Patient Instructions: 1)  Your physician recommends that you schedule a follow-up appointment in: 1 week 2)  Your physician has recommended you make the following change in your medication: Increase Diltiazem to 360mg  by mouth once daily. 3)  Remember to take extra dose of Lasix tonight with Potassium dose. 4)    Prescriptions: DILTIAZEM HCL ER BEADS 360 MG XR24H-CAP (DILTIAZEM HCL ER BEADS) TAKE 1 TABLET by mouth once daily  #30 x 6   Entered by:   Larita Fife Via LPN   Authorized by:   Joni Reining, NP   Signed by:   Larita Fife Via LPN on  03/28/2009   Method used:   Electronically to        [facility]* (retail)       [address]       [place], [place]  [address]       Ph: [phone] or [phone]       Fax: [phone]   RxID:   [id number]

## 2010-04-23 NOTE — Medication Information (Signed)
Summary: ccr-lr  Anticoagulant Therapy  Managed by: Vashti Hey, RN PCP: Dr.Knowlton Supervising MD: Dietrich Pates MD, Molly Maduro Indication 1: Atrial Fibrillation Lab Used: [facility] [place] Site: [place] INR POC 2.2  Dietary changes: no    Health status changes: no    Bleeding/hemorrhagic complications: no    Recent/future hospitalizations: no    Any changes in medication regimen? no    Recent/future dental: no  Any missed doses?: no       Is patient compliant with meds? yes       Allergies: No Known Drug Allergies  Anticoagulation Management History:      The patient is taking warfarin and comes in today for a routine follow up visit.  Positive risk factors for bleeding include an age of 71 years or older.  The bleeding index is 'intermediate risk'.  Positive CHADS2 values include History of HTN.  Negative CHADS2 values include Age > 49 years old.  His last INR was 1.33.  Anticoagulation responsible provider: Dietrich Pates MD, Molly Maduro.  INR POC: 2.2.  Cuvette Lot#: [id number].  Exp: 10/11.    Anticoagulation Management Assessment/Plan:      The patient's current anticoagulation dose is Warfarin sodium 5 mg tabs: take as directed.  The target INR is 2.0-3.0.  The next INR is due 09/06/2009.  Anticoagulation instructions were given to patient.  Results were reviewed/authorized by Vashti Hey, RN.  He was notified by Vashti Hey RN.         Prior Anticoagulation Instructions: INR 2.3 Continue coumadin 5mg  once daily except 2.5 on Mondays, Wednesdays and Fridays  Current Anticoagulation Instructions: INR 2.2 Continue coumadin 5mg  once daily except 2.5mg  on Mondays, Wednesdays and Fridays

## 2010-04-23 NOTE — Miscellaneous (Signed)
Summary: LABS CBCD,CMP,06/07/2009  Clinical Lists Changes  Observations: Added new observation of CALCIUM: 9.4 mg/dL (81/19/1478 29:56) Added new observation of ALBUMIN: 4.5 g/dL (21/30/8657 84:69) Added new observation of PROTEIN, TOT: 7.7 g/dL (62/95/2841 32:44) Added new observation of SGPT (ALT): 12 units/L (06/07/2009 17:09) Added new observation of SGOT (AST): 14 units/L (06/07/2009 17:09) Added new observation of ALK PHOS: 80 units/L (06/07/2009 17:09) Added new observation of CREATININE: 1.00 mg/dL (03/26/7251 66:44) Added new observation of BUN: 23 mg/dL (03/47/4259 56:38) Added new observation of BG RANDOM: 83 mg/dL (75/64/3329 51:88) Added new observation of CO2 PLSM/SER: 25 meq/L (06/07/2009 17:09) Added new observation of CL SERUM: 104 meq/L (06/07/2009 17:09) Added new observation of K SERUM: 4.9 meq/L (06/07/2009 17:09) Added new observation of NA: 140 meq/L (06/07/2009 17:09) Added new observation of PLATELETK/UL: 275 K/uL (06/07/2009 17:09) Added new observation of MCV: 85.2 fL (06/07/2009 17:09) Added new observation of HCT: 42.0 % (06/07/2009 17:09) Added new observation of HGB: 13.1 g/dL (41/66/0630 16:01) Added new observation of WBC COUNT: 7.1 10*3/microliter (06/07/2009 17:09)

## 2010-04-23 NOTE — Medication Information (Signed)
Summary: ccr-lr  Anticoagulant Therapy  Managed by: Vashti Hey, RN PCP: Dr.Knowlton Supervising MD: Daleen Squibb MD, Maisie Fus Indication 1: Atrial Fibrillation Lab Used: [facility] [place] Site: [place] INR POC 2.0  Dietary changes: no    Health status changes: no    Bleeding/hemorrhagic complications: no    Recent/future hospitalizations: yes       Details: pending DCCV  Any changes in medication regimen? no    Recent/future dental: no  Any missed doses?: no       Is patient compliant with meds? yes       Allergies: No Known Drug Allergies  Anticoagulation Management History:      The patient is taking warfarin and comes in today for a routine follow up visit.  Positive risk factors for bleeding include an age of 71 years or older.  The bleeding index is 'intermediate risk'.  Positive CHADS2 values include History of HTN.  Negative CHADS2 values include Age > 47 years old.  His last INR was 1.33.  Anticoagulation responsible provider: Daleen Squibb MD, Maisie Fus.  INR POC: 2.0.  Cuvette Lot#: [id number].  Exp: 10/11.    Anticoagulation Management Assessment/Plan:      The patient's current anticoagulation dose is Warfarin sodium 5 mg tabs: take as directed.  The target INR is 2.0-3.0.  The next INR is due 06/04/2009.  Anticoagulation instructions were given to patient.  Results were reviewed/authorized by Vashti Hey, RN.  He was notified by Vashti Hey RN.         Prior Anticoagulation Instructions: INR 1.9 Take coumadin 5mg  once daily except 2.5mg  on Sundays, Tuesdays and Fridays  Current Anticoagulation Instructions: INR 2.0 Increase coumadin to 5mg  once daily except 2.5mg  on Sundays and Fridays

## 2010-04-23 NOTE — Letter (Signed)
Summary: [place] Results Engineer, agricultural at [facility]  [address], [place] [address]   Phone: [phone]  Fax: [phone]      May 29, 2009 MRN: [id number]   [facility] [name], [place]  [address]   Dear Mr. Shelp,  Your test ordered by Selena Batten has been reviewed by your physician (or physician assistant) and was found to be normal or stable. Your physician (or physician assistant) felt no changes were needed at this time.  ____ Echocardiogram  ____ Cardiac Stress Test  __X__ Lab Work  ____ Peripheral vascular study of arms, legs or neck  ____ CT scan or X-ray  ____ Lung or Breathing test  ____ Other:  No change in medical treatment at this time, per Dr. Dietrich Pates.  Enclosed is a copy of your labwork for your records.  Thank you, Nayeliz Hipp Allyne Gee RN    [place] Bing, MD, Lenise Arena.C.Gaylord Shih, MD, F.A.C.C Lewayne Bunting, MD, F.A.C.C Nona Dell, MD, F.A.C.C Charlton Haws, MD, Lenise Arena.C.C

## 2010-04-23 NOTE — Assessment & Plan Note (Signed)
Summary: 1 MTH FU PER CHECKOUT ON 05/08/09/TG   Visit Type:  Follow-up Primary Provider:  Dr.Knowlton   History of Present Illness: Charles [name] returns to the office as scheduled for continued assessment and treatment of atrial fibrillation.  I had anticipated that cardioversion would be performed prior to this return visit, but that procedure has been delayed due to variable anticoagulation.  He has increased his activity, and continues to be asymptomatic from a cardiovascular standpoint.  He does experience fatigue after taking his a.m. dose of carvedilol.  Current Medications (verified): 1)  Hydrocodone-Acetaminophen 7.5-650 Mg Tabs (Hydrocodone-Acetaminophen) .... Take 1 Tab Four Times A Day 2)  Warfarin Sodium 5 Mg Tabs (Warfarin Sodium) .... Take As Directed 3)  Digoxin 0.25 Mg Tabs (Digoxin) .... Take 1 Tab Daily 4)  Furosemide 40 Mg Tabs (Furosemide) .... Take 1 Tab Daily 5)  Klor-Con 20 Meq Pack (Potassium Chloride) .... Take 1 Tab Daily 6)  Alprazolam 1 Mg Tabs (Alprazolam) .... Take 1 Tab Daily 7)  Omeprazole 20 Mg Cpdr (Omeprazole) .... Take 1 Tab Two Times A Day 8)  Tramadol Hcl 50 Mg Tabs (Tramadol Hcl) .... Take As Directed 9)  Diltiazem Hcl Er Beads 120 Mg Xr24h-Cap (Diltiazem Hcl Er Beads) .... Take One Capsule By Mouth Daily 10)  Carvedilol 12.5 Mg Tabs (Carvedilol) .... Take One Tablet By Mouth Twice A Day 11)  Lisinopril 10 Mg Tabs (Lisinopril) .... Take 1 Tablet By Mouth Once A Day 12)  Simvastatin 40 Mg Tabs (Simvastatin) .... Take One Tablet By Mouth Daily At Bedtime  Allergies (verified): No Known Drug Allergies  Past History:  PMH, FH, and Social History reviewed and updated.  Review of Systems  The patient denies weight loss, weight gain, hoarseness, chest pain, syncope, dyspnea on exertion, peripheral edema, prolonged cough, headaches, hemoptysis, abdominal pain, melena, and hematochezia.         see HPI.  Vital Signs:  Patient profile:   70  year old male Weight:      221 pounds Pulse rate:   56 / minute BP sitting:   126 / 73  (right arm)  Vitals Entered By: Dreama Saa, CNA (June 07, 2009 1:36 PM)  Physical Exam  General:    proportionate weight and height; no acute distress:   Neck-No JVD; no carotid bruits: Lungs-No tachypnea, no rales; no rhonchi; no wheezes: Cardiovascular-irregular rhythm; normal PMI; normal S1 and S2; modest systolic murmur Abdomen-BS normal; soft and non-tender without masses or organomegaly:  Musculoskeletal-No deformities, no cyanosis or clubbing: Neurologic-Normal cranial nerves; symmetric strength and tone:  Skin-Warm, no significant lesions: Extremities-Nl distal pulses; 1/2+ edema    Impression & Recommendations:  Problem # 1:  ATRIAL FIBRILLATION (ICD-427.31) Since heart rate is on the slow side, and he is experiencing potential adverse effects to beta blocker, his dose of carvedilol will be reduced to 12.5 mg b.i.d.  He probably does not require 3 drugs for control of heart rate.  Ultimately, we may be able to discontinue digoxin, diltiazem or both.  Anticoagulation has been entirely therapeutic for 2 weeks.  Prior to that, there was a single INR value of 1.9.   Cardioversion has been scheduled for one week hence.  INR is 4.4 today, and Coumadin dosage will be adjusted modestly downward.  A CBC and complete metabolic profile will be obtained prior to his procedure.  I will reevaluate him in the office 3 weeks thereafter.  Problem # 2:  HYPERTENSION (ICD-401.9) Blood pressure control is  excellent.  Current medications will be continued.  Problem # 3:  CARDIOMYOPATHY, NONISCHEMIC (ICD-425.4) Coreg will be decreased to 12.5 mg b.i.d. to determine if this improves the patient's fatigue.  Other appropriate medications have been adjusted.  My hope is that his cardiomyopathy is rate related and will resolve following return to normal sinus rhythm.  Other Orders: Cardioversion  (Cardioversion) T-Comprehensive Metabolic Panel [phone]) T-CBC w/Diff [id number])  Patient Instructions: 1)  Your physician recommends that you schedule a follow-up appointment in: 3 weeks after carversion 2)  Your physician recommends that you return for lab work in: today 3)  Your physician has recommended you make the following change in your medication: decrease coreg to 12.5 mg two times a day' 4)  cardioversion 06/12/2009

## 2010-04-23 NOTE — Miscellaneous (Signed)
Summary: simvastatin prescription  Clinical Lists Changes  Medications: Added new medication of SIMVASTATIN 40 MG TABS (SIMVASTATIN) Take one tablet by mouth daily at bedtime - Signed Rx of SIMVASTATIN 40 MG TABS (SIMVASTATIN) Take one tablet by mouth daily at bedtime;  #30 x 6;  Signed;  Entered by: Teressa Lower RN;  Authorized by: Kathlen Brunswick, MD, [facility];  Method used: Electronically to [facility] [address], [place], [place], [place]  [address], Ph: [phone] or [phone], Fax: [phone]    Prescriptions: SIMVASTATIN 40 MG TABS (SIMVASTATIN) Take one tablet by mouth daily at bedtime  #30 x 6   Entered by:   Teressa Lower RN   Authorized by:   Kathlen Brunswick, MD, [facility]   Signed by:   Teressa Lower RN on 04/27/2009   Method used:   Electronically to        [facility] (retail)       [address]       [place], [place]  [address]       Ph: [phone] or [phone]       Fax: [phone]   RxID:   [id number]   Appended Document: Orders Update    Clinical Lists Changes  Orders: Added new Test order of T-Lipid Profile [id number]) - Signed

## 2010-04-23 NOTE — Medication Information (Signed)
Summary: ccr-lr  Anticoagulant Therapy  Managed by: Vashti Hey, RN PCP: Dr.Knowlton Supervising MD: Dietrich Pates MD, Molly Maduro Indication 1: Atrial Fibrillation Lab Used: [facility] [place] Site: [place] INR POC 2.2  Dietary changes: no    Health status changes: no    Bleeding/hemorrhagic complications: no    Recent/future hospitalizations: no    Any changes in medication regimen? no    Recent/future dental: no  Any missed doses?: no       Is patient compliant with meds? yes       Allergies: No Known Drug Allergies  Anticoagulation Management History:      The patient is taking warfarin and comes in today for a routine follow up visit.  Positive risk factors for bleeding include an age of 71 years or older.  The bleeding index is 'intermediate risk'.  Positive CHADS2 values include History of HTN.  Negative CHADS2 values include Age > 10 years old.  His last INR was 1.33.  Anticoagulation responsible provider: Dietrich Pates MD, Molly Maduro.  INR POC: 2.2.  Cuvette Lot#: [id number].  Exp: 10/11.    Anticoagulation Management Assessment/Plan:      The patient's current anticoagulation dose is Warfarin sodium 5 mg tabs: take as directed.  The target INR is 2.0-3.0.  The next INR is due 01/14/2010.  Anticoagulation instructions were given to patient.  Results were reviewed/authorized by Vashti Hey, RN.  He was notified by Vashti Hey RN.         Prior Anticoagulation Instructions: INR 2.4 Continue coumadin 5mg  once daily except 2.5mg  on Mondays and Fridays  Current Anticoagulation Instructions: INR 2.2 Continue coumadin 5mg  once daily except 2.5mg  on Mondays and Fridays

## 2010-04-23 NOTE — Miscellaneous (Signed)
Summary: labs tsh,t3,t48/01/2009  Clinical Lists Changes  Observations: Added new observation of TSH: 0.484 microintl units/mL (11/01/2008 9:47) Added new observation of T4, FREE: 0.90 ng/dL (58/11/9831 8:25) Added new observation of T3 FREE: 2.9 pg/mL (11/01/2008 9:47)

## 2010-04-23 NOTE — Letter (Signed)
Summary: [place] [facility] Work Engineer, agricultural at [employer]  [address] [place] [address]   Phone: [phone]  Fax: [phone]     April 27, 2009 MRN: [id number]   [facility] [address] [place], [place]  [address]      YOUR LAB WORK IS DUE   ______MARCH 4, 2011___________________________________  Please go to [facility], located across the street from [facility] on the second floor.  Hours are Monday - Friday 7am until 7:30pm         Saturday 8am until 12noon    _X_  DO NOT EAT OR DRINK AFTER MIDNIGHT EVENING PRIOR TO LABWORK  __ YOUR LABWORK IS NOT FASTING --YOU MAY EAT PRIOR TO LABWORK

## 2010-04-23 NOTE — Medication Information (Signed)
Summary: ccr-lr  Anticoagulant Therapy  Managed by: Vashti Hey, RN PCP: Dr.Knowlton Supervising MD: Dietrich Pates MD, Molly Maduro Indication 1: Atrial Fibrillation Lab Used: [facility] [place] Site: [place] INR POC 3.4  Dietary changes: no    Health status changes: no    Bleeding/hemorrhagic complications: no    Recent/future hospitalizations: yes       Details: pending DCCV  Any changes in medication regimen? no    Recent/future dental: no  Any missed doses?: no       Is patient compliant with meds? yes       Allergies: No Known Drug Allergies  Anticoagulation Management History:      The patient is taking warfarin and comes in today for a routine follow up visit.  Positive risk factors for bleeding include an age of 71 years or older.  The bleeding index is 'intermediate risk'.  Positive CHADS2 values include History of HTN.  Negative CHADS2 values include Age > 33 years old.  His last INR was 1.33.  Anticoagulation responsible provider: Dietrich Pates MD, Molly Maduro.  INR POC: 3.4.  Cuvette Lot#: [id number].  Exp: 10/11.    Anticoagulation Management Assessment/Plan:      The patient's current anticoagulation dose is Warfarin sodium 5 mg tabs: take as directed.  The target INR is 2.0-3.0.  The next INR is due 06/07/2009.  Anticoagulation instructions were given to patient.  Results were reviewed/authorized by Vashti Hey, RN.  He was notified by Vashti Hey RN.         Prior Anticoagulation Instructions: INR 2.0 Increase coumadin to 5mg  once daily except 2.5mg  on Sundays and Fridays  Current Anticoagulation Instructions: INR 3.4  Take coumadin 1/2 tablet tonight then resume 1 tablet once daily except 1/2 tablet on Sundays and Fridays

## 2010-04-23 NOTE — Medication Information (Signed)
Summary: ccr-lr  Anticoagulant Therapy  Managed by: Vashti Hey, RN PCP: Dr.Knowlton Supervising MD: Dietrich Pates MD, Molly Maduro Indication 1: Atrial Fibrillation Lab Used: [facility] [place] Site: [place] INR POC 2.0  Dietary changes: no    Health status changes: no    Bleeding/hemorrhagic complications: no    Recent/future hospitalizations: yes       Details: scheduled for DCCV 06/12/09  Any changes in medication regimen? no    Recent/future dental: no  Any missed doses?: no       Is patient compliant with meds? yes       Allergies: No Known Drug Allergies  Anticoagulation Management History:      The patient is taking warfarin and comes in today for a routine follow up visit.  Positive risk factors for bleeding include an age of 71 years or older.  The bleeding index is 'intermediate risk'.  Positive CHADS2 values include History of HTN.  Negative CHADS2 values include Age > 62 years old.  His last INR was 1.33.  Anticoagulation responsible provider: Dietrich Pates MD, Molly Maduro.  INR POC: 2.0.  Cuvette Lot#: [id number].  Exp: 10/11.    Anticoagulation Management Assessment/Plan:      The patient's current anticoagulation dose is Warfarin sodium 5 mg tabs: take as directed.  The target INR is 2.0-3.0.  The next INR is due 06/14/2009.  Anticoagulation instructions were given to patient.  Results were reviewed/authorized by Vashti Hey, RN.  He was notified by Vashti Hey RN.        Coagulation management information includes: Pending DCCV 06/12/09.  Prior Anticoagulation Instructions: INR 4.4 Hold coumadin tonight then countinue 5mg  once daily except 2.5mg  on Fridays and Sundays  Current Anticoagulation Instructions: INR 2.0 Countinue coumadin 5mg  once daily except 2.5mg  on Sundays and Fridays

## 2010-04-23 NOTE — Letter (Signed)
Summary: Cardioversion Instructions  [facility] [place]  [address] [place] [address]   Phone: [phone]  Fax: [phone]    CARDIOVERSION INSTRUCTIONS  You are scheduled for a cardioversion on June 12, 2009 with Dr. Dietrich Pates.   Please arrive at the [facility] of [facility] at 8:30 a.m.  on the day of your procedure.  1)   DIET:  A)   Nothing to eat or drink after midnight except your medications with a sip of water.       3)   MAKE SURE YOU TAKE YOUR COUMADIN.  _                                                 B)   YOU MAY TAKE ALL of your remaining medications with a small amount of water.    C)   START NEW medications:   ________________________________________________________       _________________________________________________________  5)   Must have a responsible person to drive you home.  6)   Bring a current list of your medications and current insurance cards.  7)  Diabetics Only:_______________________________________________  * Special Note:  Every effort is made to have your procedure done on time. Occasionally there are emergencies that present themselves at the hospital that may cause delays. Please be patient if a delay does occur.

## 2010-04-23 NOTE — Miscellaneous (Signed)
Summary: Orders Update  Clinical Lists Changes  Orders: Added new Test order of T-Lipid Profile ([id number]) - Signed 

## 2010-04-23 NOTE — Medication Information (Signed)
Summary: 1 WK PROTIME PER CHECKUOT ON 2/15/TG  Anticoagulant Therapy  Managed by: Vashti Hey, RN PCP: Dr.Knowlton Supervising MD: Dietrich Pates MD, Molly Maduro Indication 1: Atrial Fibrillation Lab Used: [facility] [place] Site: [place] INR POC 3.2  Dietary changes: no    Health status changes: no    Bleeding/hemorrhagic complications: no    Recent/future hospitalizations: no    Any changes in medication regimen? yes       Details: was started on simvastatin 40mg  qd   Recent/future dental: no  Any missed doses?: no       Is patient compliant with meds? yes      Comments: INR on 05/08/09 was 5.8  Has been started on Simvastatin  Allergies: No Known Drug Allergies  Anticoagulation Management History:      The patient is taking warfarin and comes in today for a routine follow up visit.  Positive risk factors for bleeding include an age of 71 years or older.  The bleeding index is 'intermediate risk'.  Positive CHADS2 values include History of HTN.  Negative CHADS2 values include Age > 59 years old.  His last INR was 1.33.  Anticoagulation responsible provider: Dietrich Pates MD, Molly Maduro.  INR POC: 3.2.  Cuvette Lot#: [id number].  Exp: 10/11.    Anticoagulation Management Assessment/Plan:      The patient's current anticoagulation dose is Warfarin sodium 5 mg tabs: take as directed.  The target INR is 2.0-3.0.  The next INR is due 05/21/2009.  Anticoagulation instructions were given to patient.  Results were reviewed/authorized by Vashti Hey, RN.  He was notified by Vashti Hey RN.         Prior Anticoagulation Instructions: INR 2.4 Continue coumadin 5mg  once daily   Current Anticoagulation Instructions: INR 3.2 Decrease coumadin to 1 tablet once daily except 1/2 tablet on Mondays

## 2010-04-23 NOTE — Letter (Signed)
Summary: Cardioversion Instructions   [facility] [place]  [address], [place] [address]   Phone: [phone]  Fax: [phone]    CARDIOVERSION INSTRUCTIONS  You are scheduled for a cardioversion on ______ with Dr. _________________.   Please arrive at the [facility] of [facility] at _____________ a.m. / p.m. on the day of your procedure.  1)   DIET:  A)   Nothing to eat or drink after midnight except your medications with a sip of water.  B)   May have clear liquid breakfast, then nothing to eat or drink after____a.m. / p.m.     Clear liquids include:  water, broth, Sprite, Ginger Ale, black coffee, tea (no              sugar), cranberry / grape / apple juice, jello (not red), popsicle from clear                 juices (not  red).  3)   MAKE SURE YOU TAKE YOUR COUMADIN.  4)   A)   DO NOT TAKE these medications before your procedure:  _____________________________________________________________       _____________________________________________________________                                                 B)   YOU MAY TAKE ALL of your remaining medications with a small amount of water.    C)   START NEW medications:   ________________________________________________________       _________________________________________________________  5)   Must have a responsible person to drive you home.  6)   Bring a current list of your medications and current insurance cards.  7)  Diabetics Only:_______________________________________________  * Special Note:  Every effort is made to have your procedure done on time. Occasionally there are emergencies that present themselves at the hospital that may cause delays. Please be patient if a delay does occur.

## 2010-04-23 NOTE — Medication Information (Signed)
Summary: ccr-lr  Anticoagulant Therapy  Managed by: Vashti Hey, RN PCP: Dr.Knowlton Supervising MD: Dietrich Pates MD, Molly Maduro Indication 1: Atrial Fibrillation Lab Used: [facility] [place] Site: [place] INR POC 4.3  Dietary changes: no    Health status changes: no    Bleeding/hemorrhagic complications: no    Recent/future hospitalizations: yes       Details: S/P DCCV  Any changes in medication regimen? no    Recent/future dental: no  Any missed doses?: no       Is patient compliant with meds? yes       Allergies: No Known Drug Allergies  Anticoagulation Management History:      The patient is taking warfarin and comes in today for a routine follow up visit.  Positive risk factors for bleeding include an age of 71 years or older.  The bleeding index is 'intermediate risk'.  Positive CHADS2 values include History of HTN.  Negative CHADS2 values include Age > 3 years old.  His last INR was 1.33.  Anticoagulation responsible provider: Dietrich Pates MD, Molly Maduro.  INR POC: 4.3.  Cuvette Lot#: [id number].  Exp: 10/11.    Anticoagulation Management Assessment/Plan:      The patient's current anticoagulation dose is Warfarin sodium 5 mg tabs: take as directed.  The target INR is 2.0-3.0.  The next INR is due 06/28/2009.  Anticoagulation instructions were given to patient.  Results were reviewed/authorized by Vashti Hey, RN.  He was notified by Vashti Hey RN.        Coagulation management information includes:  DCCV 06/12/09 successful.  Prior Anticoagulation Instructions: INR 2.5 Continue coumadin 5mg  once daily except 2.5mg  on Mondays and Fridays  Current Anticoagulation Instructions: INR 4.3 Hold coumadin tonight then resume 5mg  once daily except 2.5mg  on Mondays and Fridays

## 2010-04-23 NOTE — Medication Information (Signed)
Summary: ccr-lr  Anticoagulant Therapy  Managed by: Vashti Hey, RN PCP: Dr.Knowlton Supervising MD: Diona Browner MD, Remi Deter Indication 1: Atrial Fibrillation Lab Used: [facility] [place] Site: [place] INR POC 2.5  Dietary changes: no    Health status changes: no    Bleeding/hemorrhagic complications: no    Recent/future hospitalizations: yes       Details: Had successful  DCCV 06/13/09  Any changes in medication regimen? no    Recent/future dental: no  Any missed doses?: no       Is patient compliant with meds? yes       Allergies: No Known Drug Allergies  Anticoagulation Management History:      The patient is taking warfarin and comes in today for a routine follow up visit.  Positive risk factors for bleeding include an age of 70 years or older.  The bleeding index is 'intermediate risk'.  Positive CHADS2 values include History of HTN.  Negative CHADS2 values include Age > 16 years old.  His last INR was 1.33.  Anticoagulation responsible provider: Diona Browner MD, Remi Deter.  INR POC: 2.5.  Cuvette Lot#: [id number].  Exp: 10/11.    Anticoagulation Management Assessment/Plan:      The patient's current anticoagulation dose is Warfarin sodium 5 mg tabs: take as directed.  The target INR is 2.0-3.0.  The next INR is due 06/21/2009.  Anticoagulation instructions were given to patient.  Results were reviewed/authorized by Vashti Hey, RN.  He was notified by Vashti Hey RN.         Prior Anticoagulation Instructions: INR 2.0 Countinue coumadin 5mg  once daily except 2.5mg  on Sundays and Fridays  Current Anticoagulation Instructions: INR 2.5 Continue coumadin 5mg  once daily except 2.5mg  on Mondays and Fridays

## 2010-04-23 NOTE — Letter (Signed)
Summary: [place] [employer], agricultural at [facility]  [address], [place] [address]   Phone: [phone]  Fax: [phone]      June 13, 2009 MRN: [id number]   [facility] [address] [place], [place]  [address]   Dear Mr. Muraski,  Your test ordered by Selena Batten has been reviewed by your physician (or physician assistant) and was found to be normal or stable. Your physician (or physician assistant) felt no changes were needed at this time.  ____ Echocardiogram  ____ Cardiac Stress Test  _x___ Lab Work  ____ Peripheral vascular study of arms, legs or neck  ____ CT scan or X-ray  ____ Lung or Breathing test  ____ Other:  No change in medical treatment at this time, per Dr. Dietrich Pates.  Enclosed is a copy of your labwork for your records.  Thank you, Cletus Mehlhoff Allyne Gee RN    [place] Bing, MD, Lenise Arena.C.Gaylord Shih, MD, F.A.C.C Lewayne Bunting, MD, F.A.C.C Nona Dell, MD, F.A.C.C Charlton Haws, MD, Lenise Arena.C.C

## 2010-04-23 NOTE — Letter (Signed)
Summary: Handout Printed  Printed Handout:  - Diet - Low-Fat, Low-Saturated-Fat, Low-Cholesterol Diets 

## 2010-04-23 NOTE — Assessment & Plan Note (Signed)
Summary: 3 MTH F/U PER CHECKOUT ON 07/05/09/TG   Visit Type:  3 month follow up Primary Provider:  Dr.Knowlton   History of Present Illness: [name] returns to the office as scheduled for continued assessment and treatment of atrial fibrillation and cardiomyopathy.  Performance status remains excellent; he has been able to work outside in the heat with no problems.  He perspires profusely and drinks large quantities of water.  He continues to smoke 3 cigarettes per day reporting anxiety if he attempts to discontinue tobacco use entirely.  Patient has a history of peptic ulcer disease, has not apparently had an upper GI bleed, has no GI symptoms at present, and continues to take omeprazole b.i.d. as prescribed by Dr. Karilyn Cota at the time of his diagnosis.   EKG  Procedure date:  10/04/2009  Findings:      Atrial fibrillation Ventricular rate of 95 bpm    Preventive Screening-Counseling & Management  Alcohol-Tobacco     Smoking Status: current     Smoking Cessation Counseling: yes     Smoke Cessation Stage: ready     Packs/Day: 3 cigs  a day  Current Medications (verified): 1)  Hydrocodone-Acetaminophen 7.5-650 Mg Tabs (Hydrocodone-Acetaminophen) .... Take 1 Tab Four Times A Day 2)  Warfarin Sodium 5 Mg Tabs (Warfarin Sodium) .... Take As Directed 3)  Furosemide 40 Mg Tabs (Furosemide) .... Take 1/2 Tablet Daily 4)  Klor-Con 20 Meq Pack (Potassium Chloride) .... Take 1 Tab Daily 5)  Alprazolam 1 Mg Tabs (Alprazolam) .... Take 1 Tab Daily 6)  Omeprazole 20 Mg Cpdr (Omeprazole) .... Take 1 Tablet By Mouth Once A Day 7)  Tramadol Hcl 50 Mg Tabs (Tramadol Hcl) .... Take As Directed 8)  Diltiazem Hcl Er Beads 180 Mg Xr24h-Cap (Diltiazem Hcl Er Beads) .... Take One Capsule By Mouth Daily 9)  Carvedilol 25 Mg Tabs (Carvedilol) .... Take 1 Tab Two Times A Day 10)  Lisinopril 10 Mg Tabs (Lisinopril) .... Take 1 Tablet By Mouth Once A Day 11)  Simvastatin 40 Mg Tabs  (Simvastatin) .... Take One Tablet By Mouth Daily At Bedtime 12)  Naprosyn 500 Mg Tabs (Naproxen) .... Take 1 Tab Two Times A Day  Allergies (verified): No Known Drug Allergies  Past History:  PMH, FH, and Social History reviewed and updated.  Past Medical History: Paroxysmal atrial fibrillation-onset in 10/2008 Nonischemic cardiomyopathy with hospitalization for congestive heart failure in 02/2009; Ejection fraction of             25-30%-previously normal. 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    Vital Signs:  Patient profile:   70 year old male Height:      67 inches Weight:      221 pounds O2 Sat:      99 % on Room air Temp:     97.0 degrees F oral Pulse rate:   54 / minute BP sitting:   138 / 81  (left arm)  Vitals Entered By: Dreama Saa, CNA (October 04, 2009 11:22 AM)  O2 Flow:  Room air  Physical Exam  General:  Proportionate weight and height; no acute distress:   Neck-No JVD; no carotid bruits: Lungs-No tachypnea, no rales; no rhonchi; no wheezes: Cardiovascular-fairly rapid and irregular rhythm; normal PMI; normal S1 and S2; modest systolic murmur Abdomen-BS normal; soft and non-tender without masses or organomegaly:  Skin-Warm, no significant lesions: Extremities-Nl distal pulses; no edema    Impression & Recommendations:  Problem # 1:  CARDIOMYOPATHY, NONISCHEMIC (ICD-425.4) Repeat echocardiogram reveals normalization of left  ventricular systolic function.  While this could be an effect of his medical therapy, I suspect that he had a tachycardia-induced process.  He requires most of his medications for control of hypertension, but can probably do without diuretic.  His dose of furosemide and potassium will be halved.  He will monitor weight and call for any increase of 5 pounds or any recurrent symptoms of congestive heart failure.  Problem # 2:  ATRIAL FIBRILLATION (ICD-427.31) Heart rate control is slightly suboptimal.  His dose of diltiazem will be increased 180 mg q.d. with his next prescription.  Problem # 3:  GASTRIC ULCER, HX OF (ICD-V12.71) He will decrease his omeprazole to 20 mg q.d. and reported GI symptoms if any develop.  I will plan to reassess this nice gentleman in 6 months.  He was once again advised to completely discontinue cigarette smoking.  I advised him that he probably has withdrawn from nicotine and should have an easier time completely stopping smoking.  He has benzodiazepines to treat anxiety if that develops.  Other Orders: T-Basic Metabolic Panel [phone]) Hemoccult Cards (Take Home) (Hemoccult Cards)  Patient Instructions: 1)  Your physician recommends that you schedule a follow-up appointment in: 6 MONTHS 2)  Your physician recommends that you return for lab work in: TODAY 3)  Your physician has recommended you make the following change in your medication: DECREASE OMEPRAZOLE TO 20MG  DAILY,DECREASE LASIX TO 20MG  DAILY, DECREASE POTASSIUM TO 10 mEq DAILY, INCREASE DILTIAZEM TO 180MG  DAILY WITH NEXT REFILL 4)  Your physician has asked that you test your stool for blood. It is necessary to test 3 different stool specimens for accuracy. You will be given 3 hemoccult cards for specimen collection. For each stool specimen, place a small portion of stool sample (from 2 different areas of the stool) into the 2 squares on the card. Close card. Repeat with 2 more stool specimens. Bring the cards  back to the office for testing. 5)  Your physician discussed the hazards of tobacco use.  Tobacco use cessation is recommended and techniques and options to help you quit were discussed. 6)  Your physician recommends that you weigh, daily, at the same time every day, and in the same amount of clothing.  Please record your daily weights on the handout provided and bring it to your next appointment. 7)  CALL FOR INCREASE WEIGHT GAIN OF 5 LBS , DYSPNEA OR EDEMA Prescriptions: DILTIAZEM HCL ER BEADS 180 MG XR24H-CAP (DILTIAZEM HCL ER BEADS) Take one capsule by mouth daily  #30 x 3   Entered by:   Teressa Lower RN   Authorized by:   Kathlen Brunswick, MD, [facility]   Signed by:   Teressa Lower RN on 10/04/2009   Method used:   Electronically to        [facility] (retail)       [address]       [place], [place]  [address]       Ph: [phone] or [phone]  Fax: [phone]   RxID:   [id number] OMEPRAZOLE 20 MG CPDR (OMEPRAZOLE) Take 1 tablet by mouth once a day  #30 x 3   Entered by:   Teressa Lower RN   Authorized by:   Kathlen Brunswick, MD, [facility]   Signed by:   Teressa Lower RN on 10/04/2009   Method used:   Electronically to        [facility] (retail)       [address]       [place], [place]  [address]       Ph: [phone] or [phone]       Fax: [phone]   RxID:   [id number] FUROSEMIDE 40 MG TABS (FUROSEMIDE) take 1/2 tablet daily  #15 x 3   Entered by:   Teressa Lower RN   Authorized by:   Kathlen Brunswick, MD, [facility]   Signed by:   Teressa Lower RN on 10/04/2009   Method used:   Electronically to        [facility] (retail)       [address]       [place], [place]  [address]       Ph: [phone] or [phone]       Fax: [phone]   RxID:   [id number]

## 2010-04-23 NOTE — Assessment & Plan Note (Signed)
Summary: 3 wk f/u per checkout on 06/07/09/tg   Visit Type:  Follow-up Primary Provider:  Dr.Knowlton   History of Present Illness: [name] returns to the office following successful DC cardioversion.  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  He notes no palpitations, no lightheadedness, no dyspnea, no chest pain and no syncope.  Current Medications (verified): 1)  Hydrocodone-Acetaminophen 7.5-650 Mg Tabs (Hydrocodone-Acetaminophen) .... Take 1 Tab Four Times A Day 2)  Warfarin Sodium 5 Mg Tabs (Warfarin Sodium) .... Take As Directed 3)  Furosemide 40 Mg Tabs (Furosemide) .... Take 1 Tab Daily 4)  Klor-Con 20 Meq Pack (Potassium Chloride) .... Take 1 Tab Daily 5)  Alprazolam 1 Mg Tabs (Alprazolam) .... Take 1 Tab Daily 6)  Omeprazole 20 Mg Cpdr (Omeprazole) .... Take 1 Tab Two Times A Day 7)  Tramadol Hcl 50 Mg Tabs (Tramadol Hcl) .... Take As Directed 8)  Diltiazem Hcl Er Beads 120 Mg Xr24h-Cap (Diltiazem Hcl Er Beads) .... Take One Capsule By Mouth Daily 9)  Carvedilol 12.5 Mg Tabs (Carvedilol) .... Take One Tablet By Mouth Twice A Day 10)  Lisinopril 10 Mg Tabs (Lisinopril) .... Take 1 Tablet By Mouth Once A Day 11)  Simvastatin 40 Mg Tabs (Simvastatin) .... Take One Tablet By Mouth Daily At Bedtime  Allergies (verified): No Known Drug Allergies  Past History:  PMH, FH, and Social History reviewed and updated.  Review of Systems       See history of present illness.  Vital Signs:  Patient profile:   70 year old male Weight:      225 pounds Pulse rate:   57 / minute BP sitting:   98 / 55  (right arm)  Vitals Entered By: Dreama Saa, CNA (July 05, 2009 1:19 PM)  Physical Exam  General:    Proportionate weight and height; no acute distress:   Neck-No JVD; no carotid bruits: Lungs-No tachypnea, no rales; no rhonchi; no wheezes: Cardiovascular-irregular rhythm; normal PMI; normal S1 and S2; modest  systolic murmur Abdomen-BS normal; soft and non-tender without masses or organomegaly:  Skin-Warm, no significant lesions: Extremities-Nl distal pulses; 1+ edema    Impression & Recommendations:  Problem # 1:  CARDIOMYOPATHY, NONISCHEMIC (ICD-425.4) He is doing very well symptomatically.  A repeat echocardiogram will be performed to assess whether any of his left ventricular dysfunction represented a rate related cardiomyopathy.  Current therapy appears to be effective.  Problem # 2:  ATRIAL FIBRILLATION (ICD-427.31) Atrial fibrillation has recurred rapidly following cardioversion.  It does not appear that patient is aware whether or not his arrhythmias is present.  Accordingly, a rate control strategy will be pursued.  Since heart rate is on the slow side, digoxin will be discontinued.  Heart rate will be reassessed at an anticoagulation clinic visit in 2 weeks.  Problem # 3:  HYPERTENSION (ICD-401.9) Blood pressure is on the low side today, if anything.  Patient reports typical values of 120/80.  Current antihypertensive drugs will be continued.  Problem # 4:  TOBACCO ABUSE (ICD-305.1) Patient has tapered to 3 cigarettes per day and intends to quit entirely in the near future.  A return office visit is anticipated in 3 months.  Other Orders: 2-D Echocardiogram (2D Echo)  Patient Instructions: 1)  Your physician recommends that you schedule a follow-up appointment in: 3 months 2)  Your physician has requested that you have an echocardiogram.  Echocardiography is a painless test that uses sound  waves to create images of your heart. It provides your doctor with information about the size and shape of your heart and how well your heart's chambers and valves are working.  This procedure takes approximately one hour. There are no restrictions for this procedure.  EKG  Procedure date:  07/05/2009  Findings:      Rhythm Strip  Atrial fibrillation with a slow ventricular response of 60  beats per minute.

## 2010-04-23 NOTE — Medication Information (Signed)
Summary: ccr-lr  Anticoagulant Therapy  Managed by: Vashti Hey, RN PCP: Dr.Knowlton Supervising MD: Dietrich Pates MD, Molly Maduro Indication 1: Atrial Fibrillation Lab Used: [facility] [place] Site: [place] INR POC 2.2  Dietary changes: no    Health status changes: no    Bleeding/hemorrhagic complications: no    Recent/future hospitalizations: no    Any changes in medication regimen? no    Recent/future dental: no  Any missed doses?: no       Is patient compliant with meds? yes       Allergies: No Known Drug Allergies  Anticoagulation Management History:      The patient is taking warfarin and comes in today for a routine follow up visit.  Positive risk factors for bleeding include an age of 71 years or older.  The bleeding index is 'intermediate risk'.  Positive CHADS2 values include History of HTN.  Negative CHADS2 values include Age > 72 years old.  His last INR was 1.33.  Anticoagulation responsible provider: Dietrich Pates MD, Molly Maduro.  INR POC: 2.2.  Cuvette Lot#: [id number].  Exp: 10/11.    Anticoagulation Management Assessment/Plan:      The patient's current anticoagulation dose is Warfarin sodium 5 mg tabs: take as directed.  The target INR is 2.0-3.0.  The next INR is due 10/03/2009.  Anticoagulation instructions were given to patient.  Results were reviewed/authorized by Vashti Hey, RN.  He was notified by Vashti Hey RN.         Prior Anticoagulation Instructions: INR 2.2 Continue coumadin 5mg  once daily except 2.5mg  on Mondays, Wednesdays and Fridays  Current Anticoagulation Instructions: Same as Prior Instructions.

## 2010-04-23 NOTE — Miscellaneous (Signed)
Summary: hemoccult cards 01/23/2010  Clinical Lists Changes  Observations: Added new observation of HEMOCCULT 3: neg (01/23/2010 10:35) Added new observation of HEMOCCULT 2: neg (01/23/2010 10:35) Added new observation of HEMOCCULT 1: neg (01/23/2010 10:35)

## 2010-04-23 NOTE — Letter (Signed)
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 [place] [address]   Phone: [phone]  Fax: [phone]      Aug 01, 2009 MRN: [id number]   [facility] [address] [place], [place]  [address]   Dear Mr. Flanary,  Your test ordered by Selena Batten has been reviewed by your physician (or physician assistant) and was found to be normal or stable. Your physician (or physician assistant) felt no changes were needed at this time.  __x__ Echocardiogram  ____ Cardiac Stress Test  ____ Lab Work  ____ Peripheral vascular study of arms, legs or neck  ____ CT scan or X-ray  ____ Lung or Breathing test  ____ Other: No change in medical treatment at this time, per Dr. Dietrich Pates.  Please keep your follow up appointment October 04, 2009   11:15pm.   Thank you, Teressa Lower RN    [place] Bing, MD, F.A.C.Gaylord Shih, MD, F.A.C.C Lewayne Bunting, MD, F.A.C.C Nona Dell, MD, F.A.C.C Charlton Haws, MD, Lenise Arena.C.C

## 2010-04-23 NOTE — Medication Information (Signed)
Summary: ccr-lr  Anticoagulant Therapy  Managed by: Vashti Hey, RN PCP: Dr.Knowlton Supervising MD: Dietrich Pates MD, Molly Maduro Indication 1: Atrial Fibrillation Lab Used: [facility] [place] Site: [place] INR POC 4.4  Dietary changes: no    Health status changes: no    Bleeding/hemorrhagic complications: no    Recent/future hospitalizations: yes       Details: pending DCCV  Any changes in medication regimen? no    Recent/future dental: no  Any missed doses?: no       Is patient compliant with meds? yes       Allergies: No Known Drug Allergies  Anticoagulation Management History:      The patient is taking warfarin and comes in today for a routine follow up visit.  Positive risk factors for bleeding include an age of 71 years or older.  The bleeding index is 'intermediate risk'.  Positive CHADS2 values include History of HTN.  Negative CHADS2 values include Age > 36 years old.  His last INR was 1.33.  Anticoagulation responsible provider: Dietrich Pates MD, Molly Maduro.  INR POC: 4.4.  Cuvette Lot#: [id number].  Exp: 10/11.    Anticoagulation Management Assessment/Plan:      The patient's current anticoagulation dose is Warfarin sodium 5 mg tabs: take as directed.  The target INR is 2.0-3.0.  The next INR is due 06/11/2009.  Anticoagulation instructions were given to patient.  Results were reviewed/authorized by Vashti Hey, RN.  He was notified by Vashti Hey RN.         Prior Anticoagulation Instructions: INR 3.4  Take coumadin 1/2 tablet tonight then resume 1 tablet once daily except 1/2 tablet on Sundays and Fridays  Current Anticoagulation Instructions: INR 4.4 Hold coumadin tonight then countinue 5mg  once daily except 2.5mg  on Fridays and Sundays

## 2010-04-23 NOTE — Medication Information (Signed)
Summary: ccr-lr  Anticoagulant Therapy  Managed by: Vashti Hey, RN PCP: Dr.Knowlton Supervising MD: Daleen Squibb MD, Maisie Fus Indication 1: Atrial Fibrillation Lab Used: [facility] [place] Site: [place] INR POC 2.5  Dietary changes: no    Health status changes: no    Bleeding/hemorrhagic complications: no    Recent/future hospitalizations: no    Any changes in medication regimen? no    Recent/future dental: no  Any missed doses?: no       Is patient compliant with meds? yes       Allergies: No Known Drug Allergies  Anticoagulation Management History:      The patient is taking warfarin and comes in today for a routine follow up visit.  Positive risk factors for bleeding include an age of 37 years or older.  The bleeding index is 'intermediate risk'.  Positive CHADS2 values include History of HTN.  Negative CHADS2 values include Age > 55 years old.  His last INR was 1.33.  Anticoagulation responsible provider: Daleen Squibb MD, Maisie Fus.  INR POC: 2.5.  Cuvette Lot#: [id number].  Exp: 10/11.    Anticoagulation Management Assessment/Plan:      The patient's current anticoagulation dose is Warfarin sodium 5 mg tabs: take as directed.  The target INR is 2.0-3.0.  The next INR is due 02/11/2010.  Anticoagulation instructions were given to patient.  Results were reviewed/authorized by Vashti Hey, RN.  He was notified by Vashti Hey RN.         Prior Anticoagulation Instructions: INR 2.2 Continue coumadin 5mg  once daily except 2.5mg  on Mondays and Fridays  Current Anticoagulation Instructions: INR 2.5 Continue coumadin 5mg  once daily except 2.5mg  on Mondays and Fridays

## 2010-04-23 NOTE — Medication Information (Signed)
Summary: ccr-lr  Anticoagulant Therapy  Managed by: Vashti Hey, RN PCP: Dr.Knowlton Supervising MD: Diona Browner MD, Remi Deter Indication 1: Atrial Fibrillation Lab Used: [facility] [place] Site: [place]  Dietary changes: no    Health status changes: no    Bleeding/hemorrhagic complications: no    Recent/future hospitalizations: no    Any changes in medication regimen? no    Recent/future dental: no  Any missed doses?: no       Is patient compliant with meds? yes       Allergies: No Known Drug Allergies  Anticoagulation Management History:      The patient is taking warfarin and comes in today for a routine follow up visit.  Positive risk factors for bleeding include an age of 70 years or older.  The bleeding index is 'intermediate risk'.  Positive CHADS2 values include History of HTN.  Negative CHADS2 values include Age > 71 years old.  His last INR was 1.33.  Anticoagulation responsible provider: Diona Browner MD, Remi Deter.  Cuvette Lot#: [id number].  Exp: 10/11.    Anticoagulation Management Assessment/Plan:      The patient's current anticoagulation dose is Warfarin sodium 5 mg tabs: take as directed.  The target INR is 2.0-3.0.  The next INR is due 10/24/2009.  Anticoagulation instructions were given to patient.  Results were reviewed/authorized by Vashti Hey, RN.  He was notified by Vashti Hey RN.         Prior Anticoagulation Instructions: INR 2.2 Continue coumadin 5mg  once daily except 2.5mg  on Mondays, Wednesdays and Fridays  Current Anticoagulation Instructions: INR 1.6 Take coumadin 1 1/2 tablets tonight and tomorrow night then resume 1 tablet once daily except 1/2 tablet on Mondays, Wednesdays and Fridays

## 2010-04-23 NOTE — Progress Notes (Signed)
Summary: side effect to meds has not taken today  Phone Note Call from Patient Call back at Home Phone [phone]   Caller: PT WIFE Charles [name] Reason for Call: Talk to Nurse Summary of Call: Pt had change in meds last week and has been having a hard time staying awake been sleeping almost all day Initial call taken by: Faythe Ghee,  April 09, 2009 10:00 AM  Follow-up for Phone Call        pt and spouse states: pt is sleeping in recliner from about 8:30 until 3:00 daily since change meds at last ov: med changes: decreased diltiazem to 240mg  once daily                    carvedilol 12.5mg  two times a day x2 weeks then increase not done yet                     lisinopril 10mg   1/2 tab once daily I asked pt to change med times around and check bp for a few days then call me back with readings Follow-up by: Teressa Lower RN,  April 09, 2009 11:04 AM

## 2010-04-23 NOTE — Medication Information (Signed)
Summary: ccr-lr  Anticoagulant Therapy  Managed by: Vashti Hey, RN PCP: Dr.Knowlton Supervising MD: Dietrich Pates MD, Molly Maduro Indication 1: Atrial Fibrillation Lab Used: [facility] [place] Site: [place] INR POC 2.3  Dietary changes: no    Health status changes: no    Bleeding/hemorrhagic complications: no    Recent/future hospitalizations: no    Any changes in medication regimen? no    Recent/future dental: no  Any missed doses?: no       Is patient compliant with meds? yes       Allergies: No Known Drug Allergies  Anticoagulation Management History:      The patient is taking warfarin and comes in today for a routine follow up visit.  Positive risk factors for bleeding include an age of 70 years or older.  The bleeding index is 'intermediate risk'.  Positive CHADS2 values include History of HTN.  Negative CHADS2 values include Age > 61 years old.  His last INR was 1.33.  Anticoagulation responsible provider: Dietrich Pates MD, Molly Maduro.  INR POC: 2.3.  Cuvette Lot#: [id number].  Exp: 10/11.    Anticoagulation Management Assessment/Plan:      The patient's current anticoagulation dose is Warfarin sodium 5 mg tabs: take as directed.  The target INR is 2.0-3.0.  The next INR is due 08/09/2009.  Anticoagulation instructions were given to patient.  Results were reviewed/authorized by Vashti Hey, RN.  He was notified by Vashti Hey RN.         Prior Anticoagulation Instructions: INR 3.4 Take coumadin 1/2 tablet tonight then decrease dose to 1 tablet once daily except 1/2 tablet on Mondays, Wednesdays and Fridays  Current Anticoagulation Instructions: INR 2.3 Continue coumadin 5mg  once daily except 2.5 on Mondays, Wednesdays and Fridays

## 2010-04-23 NOTE — Letter (Signed)
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 [place] [address]   Phone: [phone]  Fax: [phone]      April 27, 2009 MRN: [id number]   [facility] [address] [place], [place]  [address]   Dear Mr. Kegg,  Your test ordered by Selena Batten has been reviewed by your physician (or physician assistant) and was found to be normal or stable. Your physician (or physician assistant) felt no changes were needed at this time.  ____ Echocardiogram  ____ Cardiac Stress Test  _x___ Lab Work  ____ Peripheral vascular study of arms, legs or neck  ____ CT scan or X-ray  ____ Lung or Breathing test  ____ Other:  Please start simvastatin 40mg  once daily.  We will send labwork in the mail next month.  Enclosed is a copy of your labwork for your records.  Thank you, Salimata Christenson Allyne Gee RN    [place] Bing, MD, Lenise Arena.C.Gaylord Shih, MD, F.A.C.C Lewayne Bunting, MD, F.A.C.C Nona Dell, MD, F.A.C.C Charlton Haws, MD, Lenise Arena.C.C

## 2010-04-23 NOTE — Medication Information (Signed)
Summary: ccr-lr  Anticoagulant Therapy  Managed by: Vashti Hey, RN PCP: Dr.Knowlton Supervising MD: Diona Browner MD, Remi Deter Indication 1: Atrial Fibrillation Lab Used: [facility] [place] Site: [place] INR POC 2.4  Dietary changes: no    Health status changes: no    Bleeding/hemorrhagic complications: no    Recent/future hospitalizations: no    Any changes in medication regimen? no    Recent/future dental: no  Any missed doses?: no       Is patient compliant with meds? yes       Allergies: 1)  ! Sulfa  Anticoagulation Management History:      The patient is taking warfarin and comes in today for a routine follow up visit.  Positive risk factors for bleeding include an age of 70 years or older.  The bleeding index is 'intermediate risk'.  Positive CHADS2 values include History of HTN.  Negative CHADS2 values include Age > 64 years old.  His last INR was 1.33.  Anticoagulation responsible provider: Diona Browner MD, Remi Deter.  INR POC: 2.4.  Cuvette Lot#: [id number].  Exp: 10/11.    Anticoagulation Management Assessment/Plan:      The patient's current anticoagulation dose is Warfarin sodium 5 mg tabs: take as directed, Warfarin sodium 5 mg tabs: take as directed.  The target INR is 2.0-3.0.  The next INR is due 05/10/2009.  Anticoagulation instructions were given to patient.  Results were reviewed/authorized by Vashti Hey, RN.  He was notified by Vashti Hey RN.         Prior Anticoagulation Instructions: INR 2.2 Continue coumadin 5mg  once daily   Current Anticoagulation Instructions: INR 2.4 Continue coumadin 5mg  once daily

## 2010-04-23 NOTE — Medication Information (Signed)
Summary: ccr-lr  Anticoagulant Therapy  Managed by: Vashti Hey, RN PCP: Dr.Knowlton Supervising MD: Diona Browner MD, Remi Deter Indication 1: Atrial Fibrillation Lab Used: [facility] [place] Site: [place] INR POC 1.9  Dietary changes: no    Health status changes: no    Bleeding/hemorrhagic complications: no    Recent/future hospitalizations: no    Any changes in medication regimen? no    Recent/future dental: no  Any missed doses?: yes     Details: coumadin been on hold due to high INR  Is patient compliant with meds? yes       Allergies: No Known Drug Allergies  Anticoagulation Management History:      The patient is taking warfarin and comes in today for a routine follow up visit.  Positive risk factors for bleeding include an age of 17 years or older.  The bleeding index is 'intermediate risk'.  Positive CHADS2 values include History of HTN.  Negative CHADS2 values include Age > 37 years old.  His last INR was 1.33.  Anticoagulation responsible provider: Diona Browner MD, Remi Deter.  INR POC: 1.9.  Cuvette Lot#: [id number].  Exp: 10/11.    Anticoagulation Management Assessment/Plan:      The patient's current anticoagulation dose is Warfarin sodium 5 mg tabs: take as directed.  The target INR is 2.0-3.0.  The next INR is due 05/28/2009.  Anticoagulation instructions were given to patient.  Results were reviewed/authorized by Vashti Hey, RN.  He was notified by Vashti Hey RN.         Prior Anticoagulation Instructions: INR 6.1 Hold coumadin x 3 days and recheck INR on Thursday 05/24/09  Current Anticoagulation Instructions: INR 1.9 Take coumadin 5mg  once daily except 2.5mg  on Sundays, Tuesdays and Fridays

## 2010-04-23 NOTE — Letter (Signed)
Summary: Handout Printed  Printed Handout:  - Diet - Cholesterol Control (without Cholesterol Chart) 

## 2010-04-23 NOTE — Medication Information (Signed)
Summary: ccr-lr  Anticoagulant Therapy  Managed by: Vashti Hey, RN PCP: Dr.Knowlton Supervising MD: Dietrich Pates MD, Molly Maduro Indication 1: Atrial Fibrillation Lab Used: [facility] [place] Site: [place] INR POC 2.4  Dietary changes: no    Health status changes: no    Bleeding/hemorrhagic complications: no    Recent/future hospitalizations: no    Any changes in medication regimen? no    Recent/future dental: no  Any missed doses?: no       Is patient compliant with meds? yes       Allergies: No Known Drug Allergies  Anticoagulation Management History:      The patient is taking warfarin and comes in today for a routine follow up visit.  Positive risk factors for bleeding include an age of 70 years or older.  The bleeding index is 'intermediate risk'.  Positive CHADS2 values include History of HTN.  Negative CHADS2 values include Age > 59 years old.  His last INR was 1.33.  Anticoagulation responsible provider: Dietrich Pates MD, Molly Maduro.  INR POC: 2.4.  Cuvette Lot#: [id number].  Exp: 10/11.    Anticoagulation Management Assessment/Plan:      The patient's current anticoagulation dose is Warfarin sodium 5 mg tabs: take as directed.  The target INR is 2.0-3.0.  The next INR is due 12/17/2009.  Anticoagulation instructions were given to patient.  Results were reviewed/authorized by Vashti Hey, RN.  He was notified by Vashti Hey RN.         Prior Anticoagulation Instructions: INR 2.0 Increase coumadin to 5mg  once daily except 2.5mg  on Mondays and Fridays  Current Anticoagulation Instructions: INR 2.4 Continue coumadin 5mg  once daily except 2.5mg  on Mondays and Fridays

## 2010-04-23 NOTE — Progress Notes (Signed)
Summary: RX REFILL  Phone Note Call from Patient Call back at Home Phone [phone]   Caller: PT WALK IN Reason for Call: Refill Medication, Talk to Nurse Summary of Call: PT NEEDS CARTIS XT 180MG  #30 CALLED IN TO [facility] [phone] Initial call taken by: Faythe Ghee,  February 05, 2010 11:16 AM    Prescriptions: DILTIAZEM HCL ER BEADS 180 MG XR24H-CAP (DILTIAZEM HCL ER BEADS) Take one capsule by mouth daily  #30 x 3   Entered by:   Teressa Lower RN   Authorized by:   Kathlen Brunswick, MD, [facility]   Signed by:   Teressa Lower RN on 02/05/2010   Method used:   Electronically to        [facility] (retail)       [address]       [place], [place]  [address]       Ph: [phone] or [phone]       Fax: [phone]   RxID:   [id number]

## 2010-04-23 NOTE — Medication Information (Signed)
Summary: ccr-lr  Anticoagulant Therapy  Managed by: Vashti Hey, RN PCP: Dr.Knowlton Supervising MD: Dietrich Pates MD, Molly Maduro Indication 1: Atrial Fibrillation Lab Used: [facility] [place] Site: [place] INR POC 3.2  Dietary changes: no    Health status changes: no    Bleeding/hemorrhagic complications: no    Recent/future hospitalizations: no    Any changes in medication regimen? no    Recent/future dental: no  Any missed doses?: no       Is patient compliant with meds? yes       Allergies: No Known Drug Allergies  Anticoagulation Management History:      The patient is taking warfarin and comes in today for a routine follow up visit.  Positive risk factors for bleeding include an age of 70 years or older.  The bleeding index is 'intermediate risk'.  Positive CHADS2 values include History of HTN.  Negative CHADS2 values include Age > 77 years old.  His last INR was 1.33.  Anticoagulation responsible provider: Dietrich Pates MD, Molly Maduro.  INR POC: 3.2.  Cuvette Lot#: [id number].  Exp: 10/11.    Anticoagulation Management Assessment/Plan:      The patient's current anticoagulation dose is Warfarin sodium 5 mg tabs: take as directed.  The target INR is 2.0-3.0.  The next INR is due 07/05/2009.  Anticoagulation instructions were given to patient.  Results were reviewed/authorized by Vashti Hey, RN.  He was notified by Vashti Hey RN.         Prior Anticoagulation Instructions: INR 4.3 Hold coumadin tonight then resume 5mg  once daily except 2.5mg  on Mondays and Fridays  Current Anticoagulation Instructions: INR 3.2 Take coumadin 1/2 tablet tonight then resume 1 tablet once daily except 1/2 tablet on Mondays and FridaysThe patient is to hold four doses of coumadin.  The dosage to be resumed includes:

## 2010-04-23 NOTE — Miscellaneous (Signed)
Summary: ECHO 07/30/2009  Clinical Lists Changes  Observations: Added new observation of ECHOINTERP:   Study Conclusions    - Left ventricle: The cavity size was normal. Wall thickness was     increased in a pattern of mild LVH. Systolic function was normal.     The estimated ejection fraction was in the range of 50% to 55%.     Wall motion was normal; there were no regional wall motion     abnormalities.   - Aortic valve: Mildly calcified annulus. Trileaflet; mildly     calcified leaflets.   - Mitral valve: Calcified annulus.   - Left atrium: The atrium was moderately dilated.   - Right atrium: The atrium was mildly dilated.   Transthoracic echocardiography. M-mode, complete 2D, spectral   Doppler, and color Doppler. Height: Height: 170.2cm. Height: 67in.   Weight: Weight: 99.3kg. Weight: 218.5lb. Body mass index: BMI:   34.3kg/m^2. Body surface area: BSA: 2.4m^2. Blood pressure: 136/88.   Patient status: Outpatient. Location: Echo laboratory.    --------------------------------------------------------------------  (07/30/2009 16:31)      Echocardiogram  Procedure date:  07/30/2009  Findings:        Study Conclusions    - Left ventricle: The cavity size was normal. Wall thickness was     increased in a pattern of mild LVH. Systolic function was normal.     The estimated ejection fraction was in the range of 50% to 55%.     Wall motion was normal; there were no regional wall motion     abnormalities.   - Aortic valve: Mildly calcified annulus. Trileaflet; mildly     calcified leaflets.   - Mitral valve: Calcified annulus.   - Left atrium: The atrium was moderately dilated.   - Right atrium: The atrium was mildly dilated.   Transthoracic echocardiography. M-mode, complete 2D, spectral   Doppler, and color Doppler. Height: Height: 170.2cm. Height: 67in.   Weight: Weight: 99.3kg. Weight: 218.5lb. Body mass index: BMI:   34.3kg/m^2. Body surface area: BSA: 2.3m^2. Blood  pressure: 136/88.   Patient status: Outpatient. Location: Echo laboratory.    --------------------------------------------------------------------

## 2010-04-23 NOTE — Medication Information (Signed)
Summary: [facility]  Anticoagulant Therapy  Managed by: Vashti Hey, RN PCP: Dr.Knowlton Supervising MD: Diona Browner MD, Remi Deter Indication 1: Atrial Fibrillation Lab Used: [facility] [place] Site: [employer] INR POC 2.0  Dietary changes: no    Health status changes: no    Bleeding/hemorrhagic complications: no    Recent/future hospitalizations: no    Any changes in medication regimen? no    Recent/future dental: no  Any missed doses?: no       Is patient compliant with meds? yes       Allergies: No Known Drug Allergies  Anticoagulation Management History:      The patient is taking warfarin and comes in today for a routine follow up visit.  Positive risk factors for bleeding include an age of 71 years or older.  The bleeding index is 'intermediate risk'.  Positive CHADS2 values include History of HTN.  Negative CHADS2 values include Age > 66 years old.  His last INR was 1.33.  Anticoagulation responsible provider: Diona Browner MD, Remi Deter.  INR POC: 2.0.  Cuvette Lot#: [id number].  Exp: 10/11.    Anticoagulation Management Assessment/Plan:      The patient's current anticoagulation dose is Warfarin sodium 5 mg tabs: take as directed.  The target INR is 2.0-3.0.  The next INR is due 11/14/2009.  Anticoagulation instructions were given to patient.  Results were reviewed/authorized by Vashti Hey, RN.  He was notified by Vashti Hey RN.         Prior Anticoagulation Instructions: INR 1.6 Take coumadin 1 1/2 tablets tonight and tomorrow night then resume 1 tablet once daily except 1/2 tablet on Mondays, Wednesdays and Fridays  Current Anticoagulation Instructions: INR 2.0 Increase coumadin to 5mg  once daily except 2.5mg  on Mondays and Fridays

## 2010-04-23 NOTE — Assessment & Plan Note (Signed)
Summary: 4 mthf/u per checkout on 11/22/08/tg   Visit Type:  Follow-up Primary Provider:  Dr.Knowlton   History of Present Illness: Return visit for this very pleasant 70 year old gentleman with an apparent nonischemic cardiomyopathy and chronic atrial fibrillation.  Since his last visit, he has done well.  He reports no dyspnea, no lightheadedness, no syncope, no chest pain and no palpitations.  His lifestyle is sedentary, but he has had no problems with his usual activities.  [facility] records were obtained and reviewed, specifically the admission note, discharge summary and cardiac catheterization report.  Current Medications (verified): 1)  Hydrocodone-Acetaminophen 7.5-650 Mg Tabs (Hydrocodone-Acetaminophen) .... Take 1 Tab Four Times A Day 2)  Warfarin Sodium 5 Mg Tabs (Warfarin Sodium) .... Take As Directed 3)  Digoxin 0.25 Mg Tabs (Digoxin) .... Take 1 Tab Daily 4)  Furosemide 40 Mg Tabs (Furosemide) .... Take 1 Tab Daily 5)  Klor-Con 20 Meq Pack (Potassium Chloride) .... Take 1 Tab Daily 6)  Alprazolam 1 Mg Tabs (Alprazolam) .... Take 1 Tab Daily 7)  Omeprazole 20 Mg Cpdr (Omeprazole) .... Take 1 Tab Two Times A Day 8)  Tramadol Hcl 50 Mg Tabs (Tramadol Hcl) .... Take As Directed 9)  Warfarin Sodium 5 Mg Tabs (Warfarin Sodium) .... Take As Directed 10)  Diltiazem Hcl Er Beads 240 Mg Xr24h-Cap (Diltiazem Hcl Er Beads) .... Take One Capsule By Mouth Daily 11)  Carvedilol 12.5 Mg Tabs (Carvedilol) .... Take One Half  Tablet By Mouth Twice A Day in 2 Weeks Increase To 1 Tablet Two Times A Day 12)  Lisinopril 10 Mg Tabs (Lisinopril) .... Take One Half  Tablet By Mouth Daily  Allergies (verified): 1)  ! Sulfa  Past History:  PMH, FH, and Social History reviewed and updated.  Past Medical History: Paroxysmal atrial fibrillation-onset in 10/2008 Nonischemic cardiomyopathy with hospitalization for congestive heart failure in 02/2009; Ejection fraction of              25-30%-previously normal. 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  His father died of myocardial infarction at age 52.   Social History: Full Time Married  Tobacco Use - Yes; 50-pack-year history; recent consumption of 1/5 pack per day  Alcohol Use - no Regular Exercise - no Drug Use - yes  Review of Systems       The patient complains of peripheral edema.  The patient denies anorexia, weight loss, weight gain, hoarseness, chest pain, syncope, dyspnea on exertion, prolonged cough, headaches, and abdominal pain.    Vital Signs:  Patient profile:   70 year old male Weight:      218 pounds Pulse rate:   81 / minute BP sitting:   139 / 79  (right arm)  Vitals Entered By: Dreama Saa, CNA (April 04, 2009 2:44 PM)  Physical Exam  General:   General-Well developed; no acute distress:   Neck-No JVD; no carotid bruits: Lungs-No tachypnea, no rales; no rhonchi; no wheezes: Cardiovascular-irregular rhythm; normal PMI; normal S1 and S2; modest systolic murmur Abdomen-BS normal; soft and non-tender without masses or organomegaly:  Musculoskeletal-No deformities, no cyanosis or clubbing: Neurologic-Normal cranial nerves; symmetric strength and tone:  Skin-Warm, no significant lesions: Extremities-Nl distal pulses; 1/2+ edema     Impression & Recommendations:  Problem # 1:  CARDIOMYOPATHY, NONISCHEMIC (ICD-425.4) Congestive heart failure now appears  compensated.  The reason for deterioration in left ventricular systolic function is unclear, as he does not have flow-limiting coronary disease, there is no history for a recent acute illness, and heart rate control has been good.  We will optimize therapy for apparent dilated cardiomyopathy by adding an ACE inhibitor and carvedilol and tapering diltiazem.  He will monitor weight and symptoms at home.  Problem # 2:  ATHEROSCLEROTIC CARDIOVASCULAR DISEASE-NONOBSTRUCTIVE (ICD-429.2) He has minimal coronary disease.  Lipid levels will be assessed and appropriate therapy added as needed.  Problem # 3:  ATRIAL FIBRILLATION (ICD-427.31) Heart rate control is good in atrial fibrillation.  I doubt that cardioversion would be beneficial and will not plan for this procedure at the present time.  Anticoagulation has been stable and therapeutic and will continue in our Coumadin clinic.  Problem # 4:  HYPERTENSION (ICD-401.9) Blood pressure control is good and will probably no longer be a problem now that he has impaired left ventricular systolic function and requires treatment for congestive heart failure.  Problem # 5:  TOBACCO ABUSE (ICD-305.1) He is congratulated on substantially tapering use of tobacco, and is advised to quit cigarette smoking entirely.  I will continue to follow this nice gentleman closely at the present time and will plan to see him again in one month.  Other Orders: Future Orders: T-Basic Metabolic Panel [id number]) ... 04/25/2009 T-BNP  (B Natriuretic Peptide) [phone]) ... 04/25/2009  Patient Instructions: 1)  Your physician recommends that you schedule a follow-up appointment in: 1 month 2)  Your physician recommends that you return for lab work in: 3 weeks 3)  Your physician has recommended you make the following change in your medication: decrease diltiazem to 240mg  once daily 4)  start carvdilol 12.5mg  one half tablet by mouth two times a day 5)  in 2 weeks increase  to one whole tablet two times a day 6)  lisinopril 1/2 tablet by mouth daily Prescriptions: LISINOPRIL 10 MG TABS (LISINOPRIL) Take one HALF  tablet by mouth daily  #30 x 6   Entered by:   Teressa Lower RN   Authorized by:   Kathlen Brunswick, MD, [facility]   Signed by:   Teressa Lower RN on 04/04/2009   Method used:   Electronically to        [facility] (retail)       [address]       [place], [place]  [address]       Ph: [phone] or [phone]       Fax: [phone]   RxID:   [id number] CARVEDILOL 12.5 MG TABS (CARVEDILOL) Take one half  tablet by mouth twice a day in 2 weeks increase to 1 tablet two times a day  #60 x 6   Entered by:   Teressa Lower RN   Authorized by:   Kathlen Brunswick, MD, [facility]   Signed by:   Teressa Lower RN on 04/04/2009   Method used:   Electronically to        [facility] (retail)       [address]       [place], [place]  [address]       Ph: [phone] or [phone]       Fax: [phone]   RxID:   [id number] DILTIAZEM HCL ER BEADS 240 MG XR24H-CAP (DILTIAZEM HCL ER BEADS) Take one capsule by mouth daily  #30 x 6   Entered by:   Teressa Lower RN   Authorized by:   Kathlen Brunswick, MD, [facility]   Signed by:   Teressa Lower RN on 04/04/2009   Method used:   Electronically to        [facility] (retail)       [address]       [place], [place]  [address]       Ph: [phone] or [phone]       Fax: [phone]   RxID:   [id number]

## 2010-04-23 NOTE — Miscellaneous (Signed)
**Note De-Identified  Obfuscation** Summary: CBC, PT, INR and BMP  Clinical Lists Changes  Observations: Added new observation of CALCIUM: 9.4 mg/dL (21/30/8657 84:69) Added new observation of GFR AA: >60 mL/min/1.75m2 (03/22/2009 10:46) Added new observation of GFR: >60 mL/min (03/22/2009 10:46) Added new observation of CREATININE: 1.02 mg/dL (62/95/2841 32:44) Added new observation of BUN: 21 mg/dL (03/26/7251 66:44) Added new observation of BG RANDOM: 109 mg/dL (03/47/4259 56:38) Added new observation of CO2 PLSM/SER: 30 meq/L (03/22/2009 10:46) Added new observation of CL SERUM: 101 meq/L (03/22/2009 10:46) Added new observation of K SERUM: 4.5 meq/L (03/22/2009 10:46) Added new observation of NA: 137 meq/L (03/22/2009 10:46) Added new observation of PLATELETK/UL: 291 K/uL (03/22/2009 10:46) Added new observation of MCV: 81.9 fL (03/22/2009 10:46) Added new observation of HCT: 38.8 % (03/22/2009 10:46) Added new observation of HGB: 12.8 g/dL (75/64/3329 51:88) Added new observation of WBC COUNT: 8.4 10*3/microliter (03/22/2009 10:46) Added new observation of INR: 1.33  (03/22/2009 10:46) Added new observation of PT PATIENT: 16.4 s (03/22/2009 10:46)

## 2010-04-23 NOTE — Miscellaneous (Signed)
Summary: LABS CMP,LIPIDS 05/11/2008 TSH,T3,T4,11/01/2008  Clinical Lists Changes  Observations: Added new observation of TSH: 0.484 microintl units/mL (11/01/2008 10:04) Added new observation of T4, FREE: 0.90 ng/dL (71/08/2692 85:46) Added new observation of T3 FREE: 2.9 pg/mL (11/01/2008 10:04) Added new observation of CALCIUM: 9.7 mg/dL (27/05/5007 38:18) Added new observation of ALBUMIN: 4.4 g/dL (29/93/7169 67:89) Added new observation of PROTEIN, TOT: 7.8 g/dL (38/12/1749 02:58) Added new observation of SGPT (ALT): 9 units/L (05/11/2008 10:04) Added new observation of SGOT (AST): 12 units/L (05/11/2008 10:04) Added new observation of ALK PHOS: 81 units/L (05/11/2008 10:04) Added new observation of CREATININE: 0.97 mg/dL (52/77/8242 35:36) Added new observation of BUN: 20 mg/dL (14/43/1540 08:67) Added new observation of BG RANDOM: 101 mg/dL (61/95/0932 67:12) Added new observation of CO2 PLSM/SER: 24 meq/L (05/11/2008 10:04) Added new observation of CL SERUM: 106 meq/L (05/11/2008 10:04) Added new observation of K SERUM: 4.9 meq/L (05/11/2008 10:04) Added new observation of NA: 142 meq/L (05/11/2008 10:04) Added new observation of LDL: 115 mg/dL (45/80/9983 38:25) Added new observation of HDL: 50 mg/dL (05/39/7673 41:93) Added new observation of TRIGLYC TOT: 161 mg/dL (79/04/4095 35:32) Added new observation of CHOLESTEROL: 197 mg/dL (99/24/2683 41:96)

## 2010-04-23 NOTE — Medication Information (Signed)
Summary: Coumadin Clinic  Anticoagulant Therapy  Managed by: Teressa Lower, RN PCP: Dr.Knowlton Supervising MD: Dietrich Pates MD, Molly Maduro Indication 1: Atrial Fibrillation Lab Used: [facility] [place] Site: [place] INR POC 5.8  Dietary changes: no    Health status changes: no    Bleeding/hemorrhagic complications: no    Recent/future hospitalizations: no    Any changes in medication regimen? yes       Details: started on simvastatin and increased diltiazem  Recent/future dental: no  Any missed doses?: no       Is patient compliant with meds? yes       Current Medications (verified): 1)  Hydrocodone-Acetaminophen 7.5-650 Mg Tabs (Hydrocodone-Acetaminophen) .... Take 1 Tab Four Times A Day 2)  Warfarin Sodium 5 Mg Tabs (Warfarin Sodium) .... Take As Directed 3)  Digoxin 0.25 Mg Tabs (Digoxin) .... Take 1 Tab Daily 4)  Furosemide 40 Mg Tabs (Furosemide) .... Take 1 Tab Daily 5)  Klor-Con 20 Meq Pack (Potassium Chloride) .... Take 1 Tab Daily 6)  Alprazolam 1 Mg Tabs (Alprazolam) .... Take 1 Tab Daily 7)  Omeprazole 20 Mg Cpdr (Omeprazole) .... Take 1 Tab Two Times A Day 8)  Tramadol Hcl 50 Mg Tabs (Tramadol Hcl) .... Take As Directed 9)  Diltiazem Hcl Er Beads 120 Mg Xr24h-Cap (Diltiazem Hcl Er Beads) .... Take One Capsule By Mouth Daily 10)  Carvedilol 25 Mg Tabs (Carvedilol) .... Take One Tablet By Mouth Twice A Day 11)  Lisinopril 10 Mg Tabs (Lisinopril) .... Take 1 Tablet By Mouth Once A Day 12)  Simvastatin 40 Mg Tabs (Simvastatin) .... Take One Tablet By Mouth Daily At Bedtime  Allergies (verified): No Known Drug Allergies  Anticoagulation Management History:      The patient is taking warfarin and comes in today for a routine follow up visit.  Positive risk factors for bleeding include an age of 71 years or older.  The bleeding index is 'intermediate risk'.  Positive CHADS2 values include History of HTN.  Negative CHADS2 values include Age > 71 years  old.  His last INR was 1.33.  Anticoagulation responsible provider: Dietrich Pates MD, Molly Maduro.  INR POC: 5.8.  Exp: 10/11.    Anticoagulation Management Assessment/Plan:      The patient's current anticoagulation dose is Warfarin sodium 5 mg tabs: take as directed.  The target INR is 2.0-3.0.  The next INR is due 05/14/2009.  Anticoagulation instructions were given to patient.  Results were reviewed/authorized by Teressa Lower, RN.  He was notified by Teressa Lower RN.         Prior Anticoagulation Instructions: INR 3.2 Decrease coumadin to 1 tablet once daily except 1/2 tablet on Mondays  Current Anticoagulation Instructions: please to have cardioversion if inr between 2.5-3.5 next 2 weeks INR 5.8 TODAY HOLD TUESDAYA ND WEDNESDAY, RESUME 1 TABLET DAILY AND RECHECK ON mONDAY  Appended Document: Coumadin Clinic This INR was checked on 05/08/09

## 2010-04-23 NOTE — Medication Information (Signed)
Summary: ccr-lr  Anticoagulant Therapy  Managed by: Vashti Hey, RN PCP: Dr.Knowlton Supervising MD: Dietrich Pates MD, Molly Maduro Indication 1: Atrial Fibrillation Lab Used: [facility] [place] Site: [place] INR POC 6.1  Dietary changes: no    Health status changes: yes       Details: increased fatigue  Bleeding/hemorrhagic complications: no    Recent/future hospitalizations: yes       Details: pending DCCV  Any changes in medication regimen? no    Recent/future dental: no  Any missed doses?: no       Is patient compliant with meds? yes       Allergies: No Known Drug Allergies  Anticoagulation Management History:      The patient is taking warfarin and comes in today for a routine follow up visit.  Positive risk factors for bleeding include an age of 24 years or older.  The bleeding index is 'intermediate risk'.  Positive CHADS2 values include History of HTN.  Negative CHADS2 values include Age > 83 years old.  His last INR was 1.33.  Anticoagulation responsible provider: Dietrich Pates MD, Molly Maduro.  INR POC: 6.1.  Cuvette Lot#: [id number].  Exp: 10/11.    Anticoagulation Management Assessment/Plan:      The patient's current anticoagulation dose is Warfarin sodium 5 mg tabs: take as directed.  The target INR is 2.0-3.0.  The next INR is due 05/24/2009.  Anticoagulation instructions were given to patient.  Results were reviewed/authorized by Vashti Hey, RN.  He was notified by Vashti Hey RN.        Coagulation management information includes: Pending DCCV.  Prior Anticoagulation Instructions: please to have cardioversion if inr between 2.5-3.5 next 2 weeks INR 5.8 TODAY HOLD TUESDAYA ND WEDNESDAY, RESUME 1 TABLET DAILY AND RECHECK ON mONDAY  Current Anticoagulation Instructions: INR 6.1 Hold coumadin x 3 days and recheck INR on Thursday 05/24/09

## 2010-04-23 NOTE — Assessment & Plan Note (Signed)
Summary: 1 MTH F/U PER CHECKOUT ON 03/25/09/TG   Visit Type:  Follow-up Primary Provider:  Dr.Knowlton   History of Present Illness: [name] returns to the office as scheduled following cardiac catheterization for continuing assessment and treatment of newly diagnosed atrial fibrillation and an apparent new nonischemic cardiomyopathy.  Patient is doing well symptomatically with no exertional dyspnea and no chest discomfort.  He has had no PND nor orthopnea, but has noted some pedal edema.  Has been limiting his exercise under the mistaken impression that this is what we wanted him to do.  Cardiac catheterization in late December revealed minimal coronary artery disease, moderately impaired left ventricular systolic function and relatively normal intracardiac pressures.  Cardiac Cath  Procedure date:  03/21/2009  Findings:      Coronary angiography-nondominant RCA; worst lesion was a 30--40% narrowing in the proximal left anterior descending; other vessels irregular.  LV-Global hypokinesis with an EF of 35%.  Hemodynamics-pulmonary artery pressure of 38/21; PCW of 12; cardiac output/index of 4.8/2.3 L per minute /m2  EKG  Procedure date:  05/08/2009  Findings:      Rhythm Strip  Atrial fibrillation with controlled ventricular response Mean heart rate is 75 bpm   Current Medications (verified): 1)  Hydrocodone-Acetaminophen 7.5-650 Mg Tabs (Hydrocodone-Acetaminophen) .... Take 1 Tab Four Times A Day 2)  Warfarin Sodium 5 Mg Tabs (Warfarin Sodium) .... Take As Directed 3)  Digoxin 0.25 Mg Tabs (Digoxin) .... Take 1 Tab Daily 4)  Furosemide 40 Mg Tabs (Furosemide) .... Take 1 Tab Daily 5)  Klor-Con 20 Meq Pack (Potassium Chloride) .... Take 1 Tab Daily 6)  Alprazolam 1 Mg Tabs (Alprazolam) .... Take 1 Tab Daily 7)  Omeprazole 20 Mg Cpdr (Omeprazole) .... Take 1 Tab Two Times A Day 8)  Tramadol Hcl 50 Mg Tabs (Tramadol Hcl) .... Take As Directed 9)  Diltiazem Hcl Er  Beads 120 Mg Xr24h-Cap (Diltiazem Hcl Er Beads) .... Take One Capsule By Mouth Daily 10)  Carvedilol 25 Mg Tabs (Carvedilol) .... Take One Tablet By Mouth Twice A Day 11)  Lisinopril 10 Mg Tabs (Lisinopril) .... Take 1 Tablet By Mouth Once A Day 12)  Simvastatin 40 Mg Tabs (Simvastatin) .... Take One Tablet By Mouth Daily At Bedtime  Allergies (verified): No Known Drug Allergies  Past History:  PMH, FH, and Social History reviewed and updated.  Review of Systems       see history of present illness.  Vital Signs:  Patient profile:   70 year old male Weight:      227 pounds Pulse rate:   57 / minute BP sitting:   134 / 80  (right arm)  Vitals Entered By: Dreama Saa, CNA (May 08, 2009 1:17 PM)  Physical Exam  General:     -Well developed; no acute distress:   Neck-No JVD; no carotid bruits: Lungs-No tachypnea, no rales; no rhonchi; no wheezes: Cardiovascular-irregular rhythm; normal PMI; normal S1 and S2; modest systolic murmur Abdomen-BS normal; soft and non-tender without masses or organomegaly:  Musculoskeletal-No deformities, no cyanosis or clubbing: Neurologic-Normal cranial nerves; symmetric strength and tone:  Skin-Warm, no significant lesions: Extremities-Nl distal pulses; 1/2+ edema    New Orders:     1)  Cardioversion (Cardioversion)  Due: 05/08/2009   Impression & Recommendations:  Problem # 1:  CARDIOMYOPATHY, NONISCHEMIC (ICD-425.4) He is well compensated with current medical therapy.  His dose of carvedilol is not ideal and will be increased to 25 mg b.i.d.  In order to  prevent excessive slowing of his rate in atrial fibrillation, diltiazem will be reduced to 120 mg q.d.  His dose of lisinopril will be increased to 10 mg q.d. and subsequently to 20 mg q.d.  Problem # 2:  ATHEROSCLEROTIC CARDIOVASCULAR DISEASE-NONOBSTRUCTIVE (ICD-429.2) Coronary disease is fairly modest as his hyperlipidemia.  Treatment with a statin is reasonable, but not entirely  necessary.  For now, he will continue simvastatin 40 mg q.d.  Problem # 3:  ATRIAL FIBRILLATION (ICD-427.31) The onset of persistent atrial fibrillation was coincident with his development of congestive heart failure.  While I doubt that cardioversion will result in a durable return to sinus rhythm, we will try one such procedure.  This will be performed on an outpatient basis at the patient's earliest convenience.  Anticoagulation has been stable and therapeutic for a number of months.  I will plan to reassess this nice gentleman in one month.  Other Orders: Cardioversion (Cardioversion)  Patient Instructions: 1)  Your physician recommends that you schedule a follow-up appointment in: 1 month 2)  Your physician has recommended that you have a cardioversion (DCCV).  Electrical cardioversion uses a jolt of electricity to your heart either through paddles or wired patches attached to your chest. This is a controlled, usually prescheduled, procedure. Defibrillation is done under light anesthesia in the hospital, and you usually go home the day of the procedure. This is done to get your heart back into a normal rhythm. You are not awake for the procedure. Please see the instruction sheet given to you today. 3)  Your physician discussed the hazards of tobacco use.  Tobacco use cessation is recommended and techniques and options to help you quit were discussed. 4)  Your physician has recommended you make the following change in your medication: increase carvedilol to 25mg  two times a day 5)  increase lisinopril to 10mg  daily 6)  decrease diltiazem to 120mg  daily Prescriptions: DILTIAZEM HCL ER BEADS 120 MG XR24H-CAP (DILTIAZEM HCL ER BEADS) Take one capsule by mouth daily  #30 x 6   Entered by:   Teressa Lower RN   Authorized by:   Kathlen Brunswick, MD, [facility]   Signed by:   Teressa Lower RN on 05/08/2009   Method used:   Electronically to        [facility] (retail)       [address]       [place], [place]  [address]       Ph: [phone] or [phone]       Fax: [phone]   RxID:   [id number] LISINOPRIL 10 MG TABS (LISINOPRIL) Take 1 tablet by mouth once a day  #30 x 6   Entered by:   Teressa Lower RN   Authorized by:   Kathlen Brunswick, MD, [facility]   Signed by:   Teressa Lower RN on 05/08/2009   Method used:   Electronically to        [facility] (retail)       [address]       [place], [place]  [address]       Ph: [phone] or [phone]       Fax: [phone]   RxID:   [id number] CARVEDILOL 25 MG TABS (CARVEDILOL) Take one tablet by mouth twice a day  #60 x 3   Entered by:   Teressa Lower RN   Authorized by:  Kathlen Brunswick, MD, [facility]   Signed by:   Teressa Lower RN on 05/08/2009   Method used:   Electronically to        [facility] (retail)       [address]       [place], [place]  [address]       Ph: [phone] or [phone]       Fax: [phone]   RxID:   [id number]

## 2010-04-23 NOTE — Medication Information (Signed)
Summary: ccr-lr  Anticoagulant Therapy  Managed by: Vashti Hey, RN PCP: Dr.Knowlton Supervising MD: Daleen Squibb MD, Maisie Fus Indication 1: Atrial Fibrillation Lab Used: [facility] [place] Site: [place] INR POC 2.2  Dietary changes: no    Health status changes: no    Bleeding/hemorrhagic complications: no    Recent/future hospitalizations: yes       Details: Was in [facility] week of Christmas for CHF  Any changes in medication regimen? no    Recent/future dental: no  Any missed doses?: no       Is patient compliant with meds? yes       Allergies: 1)  ! Sulfa  Anticoagulation Management History:      The patient is taking warfarin and comes in today for a routine follow up visit.  Positive risk factors for bleeding include an age of 71 years or older.  The bleeding index is 'intermediate risk'.  Positive CHADS2 values include History of HTN.  Negative CHADS2 values include Age > 78 years old.  His last INR was 1.33.  Anticoagulation responsible provider: Daleen Squibb MD, Maisie Fus.  INR POC: 2.2.  Cuvette Lot#: [id number].  Exp: 10/11.    Anticoagulation Management Assessment/Plan:      The patient's current anticoagulation dose is Warfarin sodium 5 mg tabs: take as directed.  The target INR is 2.0-3.0.  The next INR is due 04/11/2009.  Anticoagulation instructions were given to patient.  Results were reviewed/authorized by Vashti Hey, RN.  He was notified by Vashti Hey RN.         Prior Anticoagulation Instructions: INR 2.3 Continue coumadin 5mg  once daily   Current Anticoagulation Instructions: INR 2.2 Continue coumadin 5mg  once daily

## 2010-04-23 NOTE — Medication Information (Signed)
Summary: ccr-lr  Anticoagulant Therapy  Managed by: Vashti Hey, RN PCP: Dr.Knowlton Supervising MD: Dietrich Pates MD, Molly Maduro Indication 1: Atrial Fibrillation Lab Used: [facility] [place] Site: [place] INR POC 3.4  Dietary changes: no    Health status changes: no    Bleeding/hemorrhagic complications: no    Recent/future hospitalizations: no    Any changes in medication regimen? no    Recent/future dental: no  Any missed doses?: no       Is patient compliant with meds? yes       Allergies: No Known Drug Allergies  Anticoagulation Management History:      The patient is taking warfarin and comes in today for a routine follow up visit.  Positive risk factors for bleeding include an age of 71 years or older.  The bleeding index is 'intermediate risk'.  Positive CHADS2 values include History of HTN.  Negative CHADS2 values include Age > 64 years old.  His last INR was 1.33.  Anticoagulation responsible provider: Dietrich Pates MD, Molly Maduro.  INR POC: 3.4.  Cuvette Lot#: [id number].  Exp: 10/11.    Anticoagulation Management Assessment/Plan:      The patient's current anticoagulation dose is Warfarin sodium 5 mg tabs: take as directed.  The target INR is 2.0-3.0.  The next INR is due 07/19/2009.  Anticoagulation instructions were given to patient.  Results were reviewed/authorized by Vashti Hey, RN.  He was notified by Vashti Hey RN.         Prior Anticoagulation Instructions: INR 3.2 Take coumadin 1/2 tablet tonight then resume 1 tablet once daily except 1/2 tablet on Mondays and FridaysThe patient is to hold four doses of coumadin.  The dosage to be resumed includes:   Current Anticoagulation Instructions: INR 3.4 Take coumadin 1/2 tablet tonight then decrease dose to 1 tablet once daily except 1/2 tablet on Mondays, Wednesdays and Fridays

## 2010-04-23 NOTE — Letter (Signed)
Summary: [place] [facility], agricultural at [employer]  [address] [place] [address]   Phone: [phone]  Fax: [phone]     April 04, 2009 MRN: [id number]   [facility] [address] [place], [place]  [address]      YOUR LAB WORK IS DUE   ___________FEBRUARY 2, 2011______________________________  Please go to [facility], located across the street from [facility] on the second floor.  Hours are Monday - Friday 7am until 7:30pm         Saturday 8am until 12noon    __  DO NOT EAT OR DRINK AFTER MIDNIGHT EVENING PRIOR TO LABWORK  _X_ YOUR LABWORK IS NOT FASTING --YOU MAY EAT PRIOR TO LABWORK

## 2010-04-23 NOTE — Medication Information (Signed)
Summary: ccr-lr  Anticoagulant Therapy  Managed by: Vashti Hey, RN PCP: Dr.Knowlton Supervising MD: Dietrich Pates MD, Molly Maduro Indication 1: Atrial Fibrillation Lab Used: [facility] [place] Site:  INR POC 2.8  Dietary changes: no    Health status changes: no    Bleeding/hemorrhagic complications: no    Recent/future hospitalizations: no    Any changes in medication regimen? no    Recent/future dental: no  Any missed doses?: no       Is patient compliant with meds? yes       Allergies: No Known Drug Allergies  Anticoagulation Management History:      The patient is taking warfarin and comes in today for a routine follow up visit.  Positive risk factors for bleeding include an age of 70 years or older.  The bleeding index is 'intermediate risk'.  Positive CHADS2 values include History of HTN.  Negative CHADS2 values include Age > 20 years old.  His last INR was 1.33.  Anticoagulation responsible provider: Dietrich Pates MD, Molly Maduro.  INR POC: 2.8.  Cuvette Lot#: [id number].  Exp: 10/11.    Anticoagulation Management Assessment/Plan:      The patient's current anticoagulation dose is Warfarin sodium 5 mg tabs: take as directed.  The target INR is 2.0-3.0.  The next INR is due 03/11/2010.  Anticoagulation instructions were given to patient.  Results were reviewed/authorized by Vashti Hey, RN.  He was notified by Vashti Hey RN.         Prior Anticoagulation Instructions: INR 2.5 Continue coumadin 5mg  once daily except 2.5mg  on Mondays and Fridays  Current Anticoagulation Instructions: INR 2.8 Continue coumadin 5mg  once daily except 2.5mg  on Mondays and Fridays

## 2010-04-25 NOTE — Medication Information (Signed)
Summary: ccr-lr  Anticoagulant Therapy  Managed by: Vashti Hey, RN PCP: Dr.Knowlton Supervising MD: Dietrich Pates MD, Molly Maduro Indication 1: Atrial Fibrillation Lab Used: [facility] [place] Site: [place] INR POC 1.5  Dietary changes: no    Health status changes: no    Bleeding/hemorrhagic complications: no    Recent/future hospitalizations: no    Any changes in medication regimen? no    Recent/future dental: no  Any missed doses?: no       Is patient compliant with meds? yes       Allergies: No Known Drug Allergies  Anticoagulation Management History:      The patient is taking warfarin and comes in today for a routine follow up visit.  Positive risk factors for bleeding include an age of 45 years or older.  The bleeding index is 'intermediate risk'.  Positive CHADS2 values include History of HTN.  Negative CHADS2 values include Age > 30 years old.  His last INR was 1.33.  Anticoagulation responsible provider: Dietrich Pates MD, Molly Maduro.  INR POC: 1.5.  Exp: 10/11.    Anticoagulation Management Assessment/Plan:      The patient's current anticoagulation dose is Warfarin sodium 5 mg tabs: take as directed.  The target INR is 2.0-3.0.  The next INR is due 04/01/2010.  Anticoagulation instructions were given to patient.  Results were reviewed/authorized by Vashti Hey, RN.  He was notified by Vashti Hey RN.         Prior Anticoagulation Instructions: INR 2.8 Continue coumadin 5mg  once daily except 2.5mg  on Mondays and Fridays  Current Anticoagulation Instructions: INR 1.5 Take coumadin 1 1/2 tablets tonight then resume 1 tablet once daily except 1/2 tablet on Mondays and Fridays

## 2010-04-25 NOTE — Letter (Signed)
Summary: [place] [facility], agricultural at [employer]  [address] [place] [address]   Phone: [phone]  Fax: [phone]     March 12, 2010 MRN: [id number]   [facility] [address] [place], [place]  [address]      YOUR LAB WORK IS DUE   September 11, 2010  Please go to [facility], located across the street from [facility] on the second floor.  Hours are Monday - Friday 7am until 7:30pm         Saturday 8am until 12noon      _X_ YOUR LABWORK IS NOT FASTING --YOU MAY EAT PRIOR TO LABWORK

## 2010-04-25 NOTE — Progress Notes (Signed)
Summary: Coumadin verification  Phone Note From Pharmacy Call back at [phone]   Caller: Mardelle Matte Summary of Call: Andy @ [address].  concerned that pt. taking coumadin improperly.  Please contact to advise. Initial call taken by: Tammi Romine CMA,  April 02, 2010 1:59 PM  Follow-up for Phone Call        verified dose with [facility] Follow-up by: Teressa Lower RN,  April 02, 2010 2:20 PM

## 2010-04-25 NOTE — Medication Information (Signed)
Summary: ccr-lr  Anticoagulant Therapy  Managed by: Vashti Hey, RN PCP: Dr.Knowlton Supervising MD: Dietrich Pates MD, Molly Maduro Indication 1: Atrial Fibrillation Lab Used: [facility] [place] Site: [place] INR POC 3.1  Dietary changes: no    Health status changes: no    Bleeding/hemorrhagic complications: no    Recent/future hospitalizations: no    Any changes in medication regimen? no    Recent/future dental: no  Any missed doses?: no       Is patient compliant with meds? yes       Allergies: No Known Drug Allergies  Anticoagulation Management History:      The patient is taking warfarin and comes in today for a routine follow up visit.  Positive risk factors for bleeding include an age of 71 years or older.  The bleeding index is 'intermediate risk'.  Positive CHADS2 values include History of HTN.  Negative CHADS2 values include Age > 69 years old.  His last INR was 1.33.  Anticoagulation responsible provider: Dietrich Pates MD, Molly Maduro.  INR POC: 3.1.  Cuvette Lot#: [id number].  Exp: 10/11.    Anticoagulation Management Assessment/Plan:      The patient's current anticoagulation dose is Warfarin sodium 5 mg tabs: take as directed.  The target INR is 2.0-3.0.  The next INR is due 04/29/2010.  Anticoagulation instructions were given to patient.  Results were reviewed/authorized by Vashti Hey, RN.  He was notified by Vashti Hey RN.         Prior Anticoagulation Instructions: INR 1.5 Take coumadin 1 1/2 tablets tonight then resume 1 tablet once daily except 1/2 tablet on Mondays and Fridays  Current Anticoagulation Instructions: INR 3.1 Continue coumadin 5mg  once daily except 2.5mg  on Mondays and Fridays

## 2010-04-25 NOTE — Miscellaneous (Signed)
Summary: labs bmp 10/05/2009  Clinical Lists Changes  Observations: Added new observation of CALCIUM: 9.4 mg/dL (14/78/2956 21:30) Added new observation of CREATININE: 0.95 mg/dL (86/57/8469 62:95) Added new observation of BUN: 17 mg/dL (28/41/3244 01:02) Added new observation of BG RANDOM: 92 mg/dL (72/53/6644 03:47) Added new observation of CO2 PLSM/SER: 25 meq/L (10/04/2009 16:32) Added new observation of CL SERUM: 107 meq/L (10/04/2009 16:32) Added new observation of K SERUM: 4.9 meq/L (10/04/2009 16:32) Added new observation of NA: 143 meq/L (10/04/2009 16:32)

## 2010-04-25 NOTE — Assessment & Plan Note (Signed)
Summary: 6 MTH FU/SN   Visit Type:  Follow-up Primary Provider:  Dr.Knowlton   History of Present Illness: [name] returns to the office for continued assessment and treatment of atrial fibrillation.  Since his last visit, he has done extremely well.  He performs some yard work and other household tasks without any cardiopulmonary symptoms.  He has had no palpitations, no lightheadedness and no syncope.  He notes occasional mild pedal edema.  He has not been seen in the emergency department nor required urgent medical care.  EKG  Procedure date:  03/12/2010  Findings:      Rhythm Strip and 6 Minute Walk  Rest-atrial fibrillation with controlled ventricular response Heart rate of 80 bpm Oxygen saturation of 98%  Post-exercise: atrial fibrillation with increase in heart rate to 100 bpm Walking distance of 1000 feet Oxygen saturation of 95%  Impression: Adequate exercise tolerance; excellent heart rate control; normal oxygen saturations.   Current Medications (verified): 1)  Hydrocodone-Acetaminophen 7.5-650 Mg Tabs (Hydrocodone-Acetaminophen) .... Take 1 Tab Four Times A Day 2)  Warfarin Sodium 5 Mg Tabs (Warfarin Sodium) .... Take As Directed 3)  Furosemide 40 Mg Tabs (Furosemide) .... Take 1/2 Tablet Daily 4)  Klor-Con 10 10 Meq Cr-Tabs (Potassium Chloride) .... Take 1 Tablet By Mouth Once Daily 5)  Alprazolam 1 Mg Tabs (Alprazolam) .... Take 1 Tab Daily 6)  Omeprazole 20 Mg Cpdr (Omeprazole) .... Take 1 Tablet By Mouth Once A Day 7)  Tramadol Hcl 50 Mg Tabs (Tramadol Hcl) .... Take As Directed 8)  Diltiazem Hcl Er Beads 180 Mg Xr24h-Cap (Diltiazem Hcl Er Beads) .... Take One Capsule By Mouth Daily 9)  Carvedilol 25 Mg Tabs (Carvedilol) .... Take 1 Tab Two Times A Day 10)  Lisinopril 10 Mg Tabs (Lisinopril) .... Take 1 Tablet By Mouth Once A Day 11)  Simvastatin 40 Mg Tabs (Simvastatin) .... Take One Tablet By Mouth Daily At Bedtime 12)  Naprosyn 500 Mg Tabs  (Naproxen) .... Take 1 Tab Two Times A Day 13)  Zolpidem Tartrate 10 Mg Tabs (Zolpidem Tartrate) .... Take 1 Tab At Bedtime  Allergies (verified): No Known Drug Allergies  Comments:  Nurse/Medical Assistant: patient brought meds and his pharmacy is [facility]  Past History:  PMH, FH, and Social History reviewed and updated.  Review of Systems       See history of present illness.  Vital Signs:  Patient profile:   70 year old male Weight:      222 pounds BMI:     34.90 O2 Sat:      95 % on Room air Pulse rate:   64 / minute BP sitting:   125 / 86  (right arm)  Vitals Entered By: Dreama Saa, CNA (March 12, 2010 2:39 PM)  O2 Flow:  Room air  Physical Exam  General:    Proportionate weight and height; no acute distress:   Neck-No JVD; no carotid bruits: Lungs-No tachypnea, no rales; no rhonchi; no wheezes: Cardiovascular: irregular rhythm; apical rate is 84 bpm; normal PMI; normal S1 and S2; minimal systolic murmur Abdomen-BS normal; soft and non-tender without masses or organomegaly:  Skin-Warm, no significant lesions: Extremities-Nl distal pulses; 1/2+ edema    Impression & Recommendations:  Problem # 1:  ATRIAL FIBRILLATION (ICD-427.31) Heart rate in atrial fibrillation is now well-controlled at rest.  A 6 minute walk test will be performed to verify good control with exercise.  Problem # 2:  ANTICOAGULATION (ICD-V58.61) Anticoagulation had been stable and  therapeutic, but most recent INR was 1.5.  We will continue to adjust warfarin dosage and monitor in [facility]madin Clinic.  Problem # 3:  HYPERTENSION (ICD-401.9) Blood pressure control has been good with current medications, which will be continued.  BP today: 125/86 Prior BP: 138/81 (10/04/2009)  Labs Reviewed: K+: 4.9 (10/05/2009) Creat: : 0.95 (10/05/2009)   Chol: 116 (05/25/2009)   HDL: 32 (05/25/2009)   LDL: 41 (05/25/2009)   TG: 216 (05/25/2009)  Problem # 4:  TOBACCO ABUSE  (ICD-305.1) Patient reports smoking 2 cigarettes per day, one with breakfast and one with dinner, and claims not to even finish these.  He retains hope that he will completely abstain from tobacco use in the near future.  Other Orders: Future Orders: T-CBC w/Diff ([id number]) ... 09/11/2010  Patient Instructions: 1)  Your physician recommends that you schedule a follow-up appointment in: 9 months 2)  Your physician recommends that you return for lab work in:6 months

## 2010-04-29 ENCOUNTER — Encounter: Payer: Self-pay | Admitting: Cardiology

## 2010-04-29 ENCOUNTER — Encounter (INDEPENDENT_AMBULATORY_CARE_PROVIDER_SITE_OTHER): Payer: Medicare Other

## 2010-04-29 DIAGNOSIS — I4891 Unspecified atrial fibrillation: Secondary | ICD-10-CM

## 2010-04-29 DIAGNOSIS — Z7901 Long term (current) use of anticoagulants: Secondary | ICD-10-CM

## 2010-05-09 NOTE — Medication Information (Signed)
Summary: ccr-lr [place]  Anticoagulant Therapy Managed by: Vashti Hey, RN Patient Assessment Part 2:  Have you MISSED ANY DOSES or CHANGED TABLETS?  0  Have you had any BRUISING or BLEEDING ( nose or gum bleeds,blood in urine or stool)?  Have you STARTED or STOPPED any MEDICATIONS, including OTC meds,herbals or supplements?  Have you CHANGED your DIET, especially green vegetables,or ALCOHOL intake?  Have you had any ILLNESSES or HOSPITALIZATIONS?  Have you had any signs of CLOTTING?(chest discomfort,dizziness,shortness of breath,arms tingling,slurred speech,swelling or redness in leg)       Regimen Out:    Total Weekly: 30 mg mg  Next INR Due: 05/29/2010      Allergies: No Known Drug Allergies  Anticoagulant Therapy  Managed by: Vashti Hey, RN PCP: Dr.Knowlton Supervising MD: Dietrich Pates MD, Molly Maduro Indication 1: Atrial Fibrillation Lab Used: [facility] [place] Site: [place] INR POC 2.0  Dietary changes: no    Health status changes: no    Bleeding/hemorrhagic complications: no    Recent/future hospitalizations: no    Any changes in medication regimen? no    Recent/future dental: no  Any missed doses?: no       Is patient compliant with meds? yes         Anticoagulation Management History:      The patient is taking warfarin and comes in today for a routine follow up visit.  Positive risk factors for bleeding include an age of 65 years or older.  The bleeding index is 'intermediate risk'.  Positive CHADS2 values include History of HTN.  Negative CHADS2 values include Age > 66 years old.  His last INR was 1.33.  Anticoagulation responsible provider: Dietrich Pates MD, Molly Maduro.  INR POC: 2.0.  Cuvette Lot#: [id number].  Exp: 10/11.    Anticoagulation Management Assessment/Plan:      The patient's current anticoagulation dose is Warfarin sodium 5 mg tabs: take as directed.  The target INR is 2.0-3.0.  The next INR is due 05/29/2010.  Anticoagulation  instructions were given to patient.  Results were reviewed/authorized by Vashti Hey, RN.  He was notified by Vashti Hey RN.         Prior Anticoagulation Instructions: INR 3.1 Continue coumadin 5mg  once daily except 2.5mg  on Mondays and Fridays  Current Anticoagulation Instructions: INR 2.0 Take coumadin 1 tablet tonight then resume 1 tablet once daily except 1/2 tablet on Mondays and Fridays

## 2010-05-29 ENCOUNTER — Encounter (INDEPENDENT_AMBULATORY_CARE_PROVIDER_SITE_OTHER): Payer: Medicare Other

## 2010-05-29 ENCOUNTER — Encounter: Payer: Self-pay | Admitting: Cardiovascular Disease

## 2010-05-29 DIAGNOSIS — I4891 Unspecified atrial fibrillation: Secondary | ICD-10-CM

## 2010-05-29 DIAGNOSIS — Z7901 Long term (current) use of anticoagulants: Secondary | ICD-10-CM

## 2010-06-04 ENCOUNTER — Other Ambulatory Visit (HOSPITAL_COMMUNITY): Payer: Self-pay | Admitting: Family Medicine

## 2010-06-04 DIAGNOSIS — R221 Localized swelling, mass and lump, neck: Secondary | ICD-10-CM

## 2010-06-04 NOTE — Medication Information (Signed)
Summary: ccr-lr  Anticoagulant Therapy  Managed by: Vashti Hey, RN PCP: Dr.Knowlton Supervising MD: Eden Emms MD, Theron Arista Indication 1: Atrial Fibrillation Lab Used: [facility] [place] Site: [place] INR POC 2.4  Dietary changes: no    Health status changes: no    Bleeding/hemorrhagic complications: no    Recent/future hospitalizations: no    Any changes in medication regimen? no    Recent/future dental: no  Any missed doses?: no       Is patient compliant with meds? yes       Allergies: No Known Drug Allergies  Anticoagulation Management History:      The patient is taking warfarin and comes in today for a routine follow up visit.  Positive risk factors for bleeding include an age of 71 years or older.  The bleeding index is 'intermediate risk'.  Positive CHADS2 values include History of HTN.  Negative CHADS2 values include Age > 71 years old.  His last INR was 1.33.  Anticoagulation responsible provider: Eden Emms MD, Theron Arista.  INR POC: 2.4.  Cuvette Lot#: [id number].  Exp: 10/11.    Anticoagulation Management Assessment/Plan:      The patient's current anticoagulation dose is Warfarin sodium 5 mg tabs: take as directed.  The target INR is 2.0-3.0.  The next INR is due 06/26/2010.  Anticoagulation instructions were given to patient.  Results were reviewed/authorized by Vashti Hey, RN.  He was notified by Vashti Hey RN.         Prior Anticoagulation Instructions: INR 2.0 Take coumadin 1 tablet tonight then resume 1 tablet once daily except 1/2 tablet on Mondays and Fridays  Current Anticoagulation Instructions: INR 2.4 Continue coumadin 5mg  once daily except 2.5mg  on Mondays and Fridays

## 2010-06-06 ENCOUNTER — Ambulatory Visit (HOSPITAL_COMMUNITY)
Admission: RE | Admit: 2010-06-06 | Discharge: 2010-06-06 | Disposition: A | Discharge status: Home / Self Care | Payer: Medicare Other | Source: Ambulatory Visit | Attending: Family Medicine | Admitting: Family Medicine

## 2010-06-06 ENCOUNTER — Encounter (HOSPITAL_COMMUNITY): Payer: Self-pay

## 2010-06-06 DIAGNOSIS — R221 Localized swelling, mass and lump, neck: Secondary | ICD-10-CM

## 2010-06-06 DIAGNOSIS — R599 Enlarged lymph nodes, unspecified: Secondary | ICD-10-CM | POA: Insufficient documentation

## 2010-06-06 DIAGNOSIS — R220 Localized swelling, mass and lump, head: Secondary | ICD-10-CM | POA: Insufficient documentation

## 2010-06-06 MED ORDER — IOHEXOL 300 MG/ML  SOLN
100.0000 mL | Freq: Once | INTRAMUSCULAR | Status: AC | PRN
  Administered 2010-06-06: 100 mL via INTRAVENOUS

## 2010-06-07 ENCOUNTER — Other Ambulatory Visit (HOSPITAL_COMMUNITY): Payer: Self-pay | Admitting: Family Medicine

## 2010-06-07 ENCOUNTER — Ambulatory Visit (HOSPITAL_COMMUNITY)
Admission: RE | Admit: 2010-06-07 | Discharge: 2010-06-07 | Disposition: A | Discharge status: Home / Self Care | Payer: Medicare Other | Source: Ambulatory Visit | Attending: Family Medicine | Admitting: Family Medicine

## 2010-06-07 DIAGNOSIS — F17200 Nicotine dependence, unspecified, uncomplicated: Secondary | ICD-10-CM

## 2010-06-07 DIAGNOSIS — R220 Localized swelling, mass and lump, head: Secondary | ICD-10-CM | POA: Insufficient documentation

## 2010-06-07 DIAGNOSIS — R599 Enlarged lymph nodes, unspecified: Secondary | ICD-10-CM | POA: Insufficient documentation

## 2010-06-07 DIAGNOSIS — R9389 Abnormal findings on diagnostic imaging of other specified body structures: Secondary | ICD-10-CM

## 2010-06-13 ENCOUNTER — Telehealth: Payer: Self-pay | Admitting: *Deleted

## 2010-06-13 NOTE — Telephone Encounter (Signed)
Pt wife calling to let us know Dr. Jodean Lima has took him off coumadin starting today until his surgery on 06/23/09.

## 2010-06-14 NOTE — Telephone Encounter (Signed)
Noted  

## 2010-06-21 ENCOUNTER — Encounter: Payer: Self-pay | Admitting: Cardiology

## 2010-06-21 ENCOUNTER — Telehealth: Payer: Self-pay | Admitting: Cardiology

## 2010-06-21 DIAGNOSIS — Z7901 Long term (current) use of anticoagulants: Secondary | ICD-10-CM

## 2010-06-21 DIAGNOSIS — I4891 Unspecified atrial fibrillation: Secondary | ICD-10-CM

## 2010-06-21 NOTE — Telephone Encounter (Signed)
Faxed Labs, LOV, Echo, cath, CXR, & EKG to Debbie at [facility]. [phone])

## 2010-06-24 HISTORY — PX: NECK LESION BIOPSY: SHX2078

## 2010-06-24 LAB — POCT I-STAT 3, ART BLOOD GAS (G3+)
Acid-Base Excess: 3 mmol/L — ABNORMAL HIGH (ref 0.0–2.0)
Bicarbonate: 28.2 mEq/L — ABNORMAL HIGH (ref 20.0–24.0)
pH, Arterial: 7.395 (ref 7.350–7.450)
pO2, Arterial: 60 mmHg — ABNORMAL LOW (ref 80.0–100.0)

## 2010-06-24 LAB — BASIC METABOLIC PANEL
BUN: 19 mg/dL (ref 6–23)
BUN: 20 mg/dL (ref 6–23)
BUN: 21 mg/dL (ref 6–23)
Chloride: 101 mEq/L (ref 96–112)
Chloride: 102 mEq/L (ref 96–112)
Creatinine, Ser: 1.01 mg/dL (ref 0.4–1.5)
GFR calc Af Amer: >60 mL/min (ref >60)
GFR calc non Af Amer: >60 mL/min (ref >60)
GFR calc non Af Amer: >60 mL/min (ref >60)
GFR calc non Af Amer: >60 mL/min (ref >60)
GFR calc non Af Amer: >60 mL/min (ref >60)
Glucose, Bld: 101 mg/dL — ABNORMAL HIGH (ref 70–99)
Glucose, Bld: 105 mg/dL — ABNORMAL HIGH (ref 70–99)
Potassium: 3.8 mEq/L (ref 3.5–5.1)
Potassium: 4.1 mEq/L (ref 3.5–5.1)
Potassium: 4.5 mEq/L (ref 3.5–5.1)
Sodium: 137 mEq/L (ref 135–145)
Sodium: 141 mEq/L (ref 135–145)

## 2010-06-24 LAB — DIFFERENTIAL
Basophils Relative: 1 % (ref 0–1)
Lymphocytes Relative: 14 % (ref 12–46)
Lymphs Abs: 1 10*3/uL (ref 0.7–4.0)
Monocytes Relative: 7 % (ref 3–12)
Neutro Abs: 5.3 10*3/uL (ref 1.7–7.7)
Neutrophils Relative %: 76 % (ref 43–77)

## 2010-06-24 LAB — CBC
HCT: 32.6 % — ABNORMAL LOW (ref 39.0–52.0)
HCT: 36.3 % — ABNORMAL LOW (ref 39.0–52.0)
HCT: 37.3 % — ABNORMAL LOW (ref 39.0–52.0)
HCT: 38.8 % — ABNORMAL LOW (ref 39.0–52.0)
Hemoglobin: 10.7 g/dL — ABNORMAL LOW (ref 13.0–17.0)
Hemoglobin: 12.2 g/dL — ABNORMAL LOW (ref 13.0–17.0)
Hemoglobin: 12.4 g/dL — ABNORMAL LOW (ref 13.0–17.0)
Hemoglobin: 12.8 g/dL — ABNORMAL LOW (ref 13.0–17.0)
MCHC: 32.9 g/dL (ref 30.0–36.0)
MCV: 80.8 fL (ref 78.0–100.0)
MCV: 81.9 fL (ref 78.0–100.0)
Platelets: 232 10*3/uL (ref 150–400)
RBC: 4.59 MIL/uL (ref 4.22–5.81)
RDW: 16.8 % — ABNORMAL HIGH (ref 11.5–15.5)
WBC: 6.3 10*3/uL (ref 4.0–10.5)
WBC: 6.5 10*3/uL (ref 4.0–10.5)
WBC: 8.4 10*3/uL (ref 4.0–10.5)

## 2010-06-24 LAB — COMPREHENSIVE METABOLIC PANEL
Alkaline Phosphatase: 75 U/L (ref 39–117)
Alkaline Phosphatase: 87 U/L (ref 39–117)
Alkaline Phosphatase: 99 U/L (ref 39–117)
BUN: 16 mg/dL (ref 6–23)
BUN: 17 mg/dL (ref 6–23)
BUN: 16 mg/dL (ref 6–23)
CO2: 28 mEq/L (ref 19–32)
Calcium: 8.7 mg/dL (ref 8.4–10.5)
Chloride: 108 mEq/L (ref 96–112)
Chloride: 101 mEq/L (ref 96–112)
Creatinine, Ser: 0.85 mg/dL (ref 0.4–1.5)
Glucose, Bld: 131 mg/dL — ABNORMAL HIGH (ref 70–99)
Glucose, Bld: 96 mg/dL (ref 70–99)
Glucose, Bld: 106 mg/dL — ABNORMAL HIGH (ref 70–99)
Potassium: 3.7 mEq/L (ref 3.5–5.1)
Potassium: 3.7 mEq/L (ref 3.5–5.1)
Total Bilirubin: 0.7 mg/dL (ref 0.3–1.2)
Total Bilirubin: 0.6 mg/dL (ref 0.3–1.2)
Total Protein: 6.7 g/dL (ref 6.0–8.3)
Total Protein: 6.9 g/dL (ref 6.0–8.3)

## 2010-06-24 LAB — HEPARIN LEVEL (UNFRACTIONATED)
Heparin Unfractionated: 0.27 IU/mL — ABNORMAL LOW (ref 0.30–0.70)
Heparin Unfractionated: 0.4 IU/mL (ref 0.30–0.70)

## 2010-06-24 LAB — POCT I-STAT 3, VENOUS BLOOD GAS (G3P V)
pCO2, Ven: 48.6 mmHg (ref 45.0–50.0)
pH, Ven: 7.36 — ABNORMAL HIGH (ref 7.250–7.300)
pO2, Ven: 31 mmHg (ref 30.0–45.0)

## 2010-06-24 LAB — PROTIME-INR
INR: 2.31 — ABNORMAL HIGH (ref 0.00–1.49)
INR: 2.32 — ABNORMAL HIGH (ref 0.00–1.49)
INR: 1.33 (ref 0.00–1.49)
INR: 1.41 (ref 0.00–1.49)
INR: 1.79 — ABNORMAL HIGH (ref 0.00–1.49)
INR: 1.98 — ABNORMAL HIGH (ref 0.00–1.49)
INR: 2.15 — ABNORMAL HIGH (ref 0.00–1.49)
Prothrombin Time: 25.3 seconds — ABNORMAL HIGH (ref 11.6–15.2)
Prothrombin Time: 17.1 seconds — ABNORMAL HIGH (ref 11.6–15.2)
Prothrombin Time: 23.8 seconds — ABNORMAL HIGH (ref 11.6–15.2)
Prothrombin Time: 24.4 seconds — ABNORMAL HIGH (ref 11.6–15.2)

## 2010-06-24 LAB — CARDIAC PANEL(CRET KIN+CKTOT+MB+TROPI)
Relative Index: 1.6 (ref 0.0–2.5)
Total CK: 116 U/L (ref 7–232)
Troponin I: 0.02 ng/mL (ref 0.00–0.06)

## 2010-06-24 LAB — APTT: aPTT: 42 seconds — ABNORMAL HIGH (ref 24–37)

## 2010-06-26 ENCOUNTER — Encounter: Payer: Medicare Other | Admitting: *Deleted

## 2010-06-28 LAB — DIFFERENTIAL
Basophils Relative: 1 % (ref 0–1)
Eosinophils Absolute: 0.1 10*3/uL (ref 0.0–0.7)
Eosinophils Relative: 1 % (ref 0–5)
Lymphs Abs: 1.4 10*3/uL (ref 0.7–4.0)
Monocytes Relative: 9 % (ref 3–12)
Neutrophils Relative %: 73 % (ref 43–77)

## 2010-06-28 LAB — CBC
HCT: 36.9 % — ABNORMAL LOW (ref 39.0–52.0)
Hemoglobin: 12.6 g/dL — ABNORMAL LOW (ref 13.0–17.0)
RBC: 4.41 MIL/uL (ref 4.22–5.81)
RDW: 17.4 % — ABNORMAL HIGH (ref 11.5–15.5)
WBC: 8.1 10*3/uL (ref 4.0–10.5)

## 2010-06-28 LAB — BASIC METABOLIC PANEL
Calcium: 9.2 mg/dL (ref 8.4–10.5)
GFR calc Af Amer: >60 mL/min (ref >60)
GFR calc non Af Amer: >60 mL/min (ref >60)
Potassium: 3.7 mEq/L (ref 3.5–5.1)
Sodium: 138 mEq/L (ref 135–145)

## 2010-06-28 LAB — PROTIME-INR: INR: 1.8 — ABNORMAL HIGH (ref 0.00–1.49)

## 2010-06-29 LAB — DIFFERENTIAL
Basophils Relative: 0 % (ref 0–1)
Eosinophils Absolute: 0 10*3/uL (ref 0.0–0.7)
Eosinophils Relative: 0 % (ref 0–5)
Lymphs Abs: 1.1 10*3/uL (ref 0.7–4.0)
Neutrophils Relative %: 77 % (ref 43–77)

## 2010-06-29 LAB — COMPREHENSIVE METABOLIC PANEL
ALT: 13 U/L (ref 0–53)
AST: 27 U/L (ref 0–37)
Alkaline Phosphatase: 72 U/L (ref 39–117)
CO2: 26 mEq/L (ref 19–32)
Chloride: 105 mEq/L (ref 96–112)
GFR calc Af Amer: >60 mL/min (ref >60)
GFR calc non Af Amer: >60 mL/min (ref >60)
Glucose, Bld: 170 mg/dL — ABNORMAL HIGH (ref 70–99)
Potassium: 3.4 mEq/L — ABNORMAL LOW (ref 3.5–5.1)
Sodium: 137 mEq/L (ref 135–145)
Total Bilirubin: 0.3 mg/dL (ref 0.3–1.2)

## 2010-06-29 LAB — CBC
Hemoglobin: 12.9 g/dL — ABNORMAL LOW (ref 13.0–17.0)
MCHC: 34.5 g/dL (ref 30.0–36.0)
RBC: 4.4 MIL/uL (ref 4.22–5.81)
WBC: 7.1 10*3/uL (ref 4.0–10.5)

## 2010-06-29 LAB — PROTIME-INR
INR: 0.9 (ref 0.00–1.49)
INR: 1.1 (ref 0.00–1.49)
Prothrombin Time: 12.3 seconds (ref 11.6–15.2)
Prothrombin Time: 12.8 seconds (ref 11.6–15.2)
Prothrombin Time: 14.3 seconds (ref 11.6–15.2)

## 2010-06-29 LAB — CARDIAC PANEL(CRET KIN+CKTOT+MB+TROPI)
CK, MB: 2.8 ng/mL (ref 0.3–4.0)
Relative Index: 1.4 (ref 0.0–2.5)

## 2010-06-30 LAB — BASIC METABOLIC PANEL
BUN: 14 mg/dL (ref 6–23)
Chloride: 102 mEq/L (ref 96–112)
Creatinine, Ser: 0.7 mg/dL (ref 0.4–1.5)
GFR calc Af Amer: >60 mL/min (ref >60)
GFR calc non Af Amer: >60 mL/min (ref >60)
Potassium: 4.2 mEq/L (ref 3.5–5.1)

## 2010-06-30 LAB — CBC
HCT: 36.9 % — ABNORMAL LOW (ref 39.0–52.0)
Platelets: 317 10*3/uL (ref 150–400)
RBC: 4.32 MIL/uL (ref 4.22–5.81)
WBC: 6.7 10*3/uL (ref 4.0–10.5)

## 2010-06-30 LAB — DIFFERENTIAL
Lymphocytes Relative: 19 % (ref 12–46)
Lymphs Abs: 1.3 10*3/uL (ref 0.7–4.0)
Monocytes Relative: 8 % (ref 3–12)
Neutrophils Relative %: 72 % (ref 43–77)

## 2010-07-01 ENCOUNTER — Ambulatory Visit (INDEPENDENT_AMBULATORY_CARE_PROVIDER_SITE_OTHER): Payer: Medicare Other | Admitting: *Deleted

## 2010-07-01 ENCOUNTER — Encounter (HOSPITAL_COMMUNITY): Payer: Medicare Other | Attending: Oncology | Admitting: Oncology

## 2010-07-01 ENCOUNTER — Other Ambulatory Visit (HOSPITAL_COMMUNITY): Payer: Self-pay | Admitting: Oncology

## 2010-07-01 DIAGNOSIS — R591 Generalized enlarged lymph nodes: Secondary | ICD-10-CM

## 2010-07-01 DIAGNOSIS — Z7901 Long term (current) use of anticoagulants: Secondary | ICD-10-CM

## 2010-07-01 DIAGNOSIS — C8589 Other specified types of non-Hodgkin lymphoma, extranodal and solid organ sites: Secondary | ICD-10-CM

## 2010-07-01 DIAGNOSIS — Z79899 Other long term (current) drug therapy: Secondary | ICD-10-CM | POA: Insufficient documentation

## 2010-07-01 DIAGNOSIS — I4891 Unspecified atrial fibrillation: Secondary | ICD-10-CM

## 2010-07-01 DIAGNOSIS — C8291 Follicular lymphoma, unspecified, lymph nodes of head, face, and neck: Secondary | ICD-10-CM | POA: Insufficient documentation

## 2010-07-01 LAB — POCT INR: INR: 1.8

## 2010-07-02 LAB — POCT HEMOGLOBIN-HEMACUE
Hemoglobin: 13.5 g/dL (ref 13.0–17.0)
Hemoglobin: 8.4 g/dL — ABNORMAL LOW (ref 13.0–17.0)
Hemoglobin: 9.3 g/dL — ABNORMAL LOW (ref 13.0–17.0)

## 2010-07-02 LAB — BASIC METABOLIC PANEL
CO2: 27 mEq/L (ref 19–32)
Chloride: 104 mEq/L (ref 96–112)
GFR calc Af Amer: >60 mL/min (ref >60)
Potassium: 4.1 mEq/L (ref 3.5–5.1)

## 2010-07-02 LAB — CBC
Hemoglobin: 9.1 g/dL — ABNORMAL LOW (ref 13.0–17.0)
MCHC: 35 g/dL (ref 30.0–36.0)
Platelets: 186 10*3/uL (ref 150–400)
RDW: 15.8 % — ABNORMAL HIGH (ref 11.5–15.5)

## 2010-07-02 LAB — CROSSMATCH
ABO/RH(D): O POS
Antibody Screen: NEGATIVE

## 2010-07-03 ENCOUNTER — Ambulatory Visit (HOSPITAL_COMMUNITY)
Admission: RE | Admit: 2010-07-03 | Discharge: 2010-07-03 | Disposition: A | Discharge status: Home / Self Care | Payer: Medicare Other | Source: Ambulatory Visit | Attending: Oncology | Admitting: Oncology

## 2010-07-03 DIAGNOSIS — Z9221 Personal history of antineoplastic chemotherapy: Secondary | ICD-10-CM | POA: Insufficient documentation

## 2010-07-03 DIAGNOSIS — I4891 Unspecified atrial fibrillation: Secondary | ICD-10-CM | POA: Insufficient documentation

## 2010-07-03 DIAGNOSIS — I059 Rheumatic mitral valve disease, unspecified: Secondary | ICD-10-CM

## 2010-07-04 ENCOUNTER — Ambulatory Visit (INDEPENDENT_AMBULATORY_CARE_PROVIDER_SITE_OTHER): Payer: Medicare Other | Admitting: Otolaryngology

## 2010-07-06 ENCOUNTER — Telehealth: Payer: Self-pay | Admitting: *Deleted

## 2010-07-06 NOTE — Telephone Encounter (Signed)
Discontinue warfarin 4 days prior to planned procedure. Check INR immediately prior to surgery. Resume warfarin on the evening of catheter insertion.  Parcoal Bing

## 2010-07-08 ENCOUNTER — Encounter: Payer: Self-pay | Admitting: *Deleted

## 2010-07-08 ENCOUNTER — Encounter (HOSPITAL_COMMUNITY)
Admission: RE | Admit: 2010-07-08 | Discharge: 2010-07-08 | Disposition: A | Discharge status: Home / Self Care | Payer: Medicare Other | Source: Ambulatory Visit | Attending: Oncology | Admitting: Oncology

## 2010-07-08 DIAGNOSIS — C8589 Other specified types of non-Hodgkin lymphoma, extranodal and solid organ sites: Secondary | ICD-10-CM | POA: Insufficient documentation

## 2010-07-08 DIAGNOSIS — R591 Generalized enlarged lymph nodes: Secondary | ICD-10-CM

## 2010-07-08 LAB — GLUCOSE, CAPILLARY: Glucose-Capillary: 93 mg/dL (ref 70–99)

## 2010-07-08 NOTE — Telephone Encounter (Signed)
Clearance letter generated and faxed to Dr. Malvin Johns

## 2010-07-09 ENCOUNTER — Other Ambulatory Visit (HOSPITAL_COMMUNITY): Payer: Self-pay | Admitting: Oncology

## 2010-07-09 ENCOUNTER — Other Ambulatory Visit (HOSPITAL_COMMUNITY): Payer: Medicare Other

## 2010-07-09 ENCOUNTER — Encounter (HOSPITAL_COMMUNITY): Payer: Medicare Other | Admitting: Oncology

## 2010-07-09 ENCOUNTER — Other Ambulatory Visit (HOSPITAL_COMMUNITY)
Admission: RE | Admit: 2010-07-09 | Discharge: 2010-07-09 | Disposition: A | Discharge status: Home / Self Care | Payer: Medicare Other | Source: Ambulatory Visit | Attending: Oncology | Admitting: Oncology

## 2010-07-09 DIAGNOSIS — C8589 Other specified types of non-Hodgkin lymphoma, extranodal and solid organ sites: Secondary | ICD-10-CM

## 2010-07-11 ENCOUNTER — Encounter (HOSPITAL_COMMUNITY): Payer: Self-pay

## 2010-07-11 ENCOUNTER — Encounter: Payer: Medicare Other | Admitting: *Deleted

## 2010-07-11 ENCOUNTER — Inpatient Hospital Stay (HOSPITAL_COMMUNITY): Payer: Medicare Other

## 2010-07-11 ENCOUNTER — Encounter (HOSPITAL_COMMUNITY)
Admission: RE | Admit: 2010-07-11 | Discharge: 2010-07-11 | Disposition: A | Discharge status: Home / Self Care | Payer: Medicare Other | Source: Ambulatory Visit | Attending: Oncology | Admitting: Oncology

## 2010-07-11 DIAGNOSIS — M899 Disorder of bone, unspecified: Secondary | ICD-10-CM | POA: Insufficient documentation

## 2010-07-11 DIAGNOSIS — C8589 Other specified types of non-Hodgkin lymphoma, extranodal and solid organ sites: Secondary | ICD-10-CM | POA: Insufficient documentation

## 2010-07-11 LAB — PROTEIN ELECTROPH W RFLX QUANT IMMUNOGLOBULINS
Alpha-2-Globulin: 11.2 % (ref 7.1–11.8)
Gamma Globulin: 15.7 % (ref 11.1–18.8)
M-Spike, %: NOT DETECTED g/dL
Total Protein ELP: 6.8 g/dL (ref 6.0–8.3)

## 2010-07-11 LAB — GLUCOSE, CAPILLARY: Glucose-Capillary: 105 mg/dL — ABNORMAL HIGH (ref 70–99)

## 2010-07-11 MED ORDER — FLUDEOXYGLUCOSE F - 18 (FDG) INJECTION
15.0000 | Freq: Once | INTRAVENOUS | Status: AC | PRN
  Administered 2010-07-11: 15 via INTRAVENOUS

## 2010-07-12 ENCOUNTER — Ambulatory Visit (HOSPITAL_COMMUNITY): Payer: Medicare Other

## 2010-07-12 ENCOUNTER — Ambulatory Visit (HOSPITAL_COMMUNITY)
Admission: RE | Admit: 2010-07-12 | Discharge: 2010-07-12 | Disposition: A | Discharge status: Home / Self Care | Payer: Medicare Other | Source: Ambulatory Visit | Attending: General Surgery | Admitting: General Surgery

## 2010-07-12 DIAGNOSIS — I10 Essential (primary) hypertension: Secondary | ICD-10-CM | POA: Insufficient documentation

## 2010-07-12 DIAGNOSIS — J449 Chronic obstructive pulmonary disease, unspecified: Secondary | ICD-10-CM | POA: Insufficient documentation

## 2010-07-12 DIAGNOSIS — C8589 Other specified types of non-Hodgkin lymphoma, extranodal and solid organ sites: Secondary | ICD-10-CM | POA: Insufficient documentation

## 2010-07-12 DIAGNOSIS — J4489 Other specified chronic obstructive pulmonary disease: Secondary | ICD-10-CM | POA: Insufficient documentation

## 2010-07-12 DIAGNOSIS — Z79899 Other long term (current) drug therapy: Secondary | ICD-10-CM | POA: Insufficient documentation

## 2010-07-12 HISTORY — PX: PORTACATH PLACEMENT: SHX2246

## 2010-07-12 LAB — PROTIME-INR
INR: 1.01 (ref 0.00–1.49)
Prothrombin Time: 13.5 seconds (ref 11.6–15.2)

## 2010-07-12 LAB — SURGICAL PCR SCREEN: MRSA, PCR: NEGATIVE

## 2010-07-15 ENCOUNTER — Ambulatory Visit (INDEPENDENT_AMBULATORY_CARE_PROVIDER_SITE_OTHER): Payer: Medicare Other | Admitting: *Deleted

## 2010-07-15 DIAGNOSIS — Z7901 Long term (current) use of anticoagulants: Secondary | ICD-10-CM

## 2010-07-15 DIAGNOSIS — I4891 Unspecified atrial fibrillation: Secondary | ICD-10-CM

## 2010-07-17 ENCOUNTER — Encounter (HOSPITAL_COMMUNITY): Payer: Medicare Other

## 2010-07-17 ENCOUNTER — Ambulatory Visit (HOSPITAL_COMMUNITY): Payer: Medicare Other | Admitting: Oncology

## 2010-07-17 DIAGNOSIS — C8589 Other specified types of non-Hodgkin lymphoma, extranodal and solid organ sites: Secondary | ICD-10-CM

## 2010-07-17 NOTE — Op Note (Addendum)
NAME:  Charles [name], Charles [name] NO.:  [id number]  MEDICAL RECORD NO.:  [id number]           PATIENT TYPE:  O  LOCATION:  XRAY                         FACILITY[facility]  [facility]  PHYSICIAN:  Barbaraann Barthel, M.D. DATE OF BIRTH:  02-18-1941  DATE OF PROCEDURE:  07/12/2010 DATE OF DISCHARGE:                              OPERATIVE REPORT   SURGEON:  Barbaraann Barthel, MD  PREOPERATIVE DIAGNOSIS:  Non-Hodgkin lymphoma.  POSTOPERATIVE DIAGNOSIS:  Non-Hodgkin lymphoma.  PROCEDURE:  Placement of Port-A-Cath via left subclavian vein.  WOUND CLASSIFICATION:  Clean.  SPECIMEN:  None.  Note, this is a 70 year old white male who was diagnosed with non- Hodgkin lymphoma.  We discussed the placement of a Port-A-Cath for which he was referred from the Oncology Department.  We discussed complications not limited to but including bleeding, infection, pneumothorax, and catheter embolization.  Informed consent was obtained.  Preoperatively, since this patient has atrial fibrillation, he was seen by Dr. Dietrich Pates at the [facility] and perioperative orders were issued by them regarding Coumadin control.  On the day of the surgery, his INR was acceptable at 1.01 with a PT of 13.5.  TECHNIQUE:  The patient was placed in Trendelenburg position and after prepping the left hemithorax with Betadine solution and draping in the usual manner, I anesthetized an area below the left clavicle with 1% Xylocaine without epinephrine and then used an 18 gauge needle and cannulated the subclavian vein.  A guidewire was placed through this under direct fluoroscopy.  Then, we used the vein dilator and then over the guidewire placed the Silastic catheter under fluoroscopic control into the area of the superior vena cava.  We then created a subcutaneous pocket after anesthetizing this with 1% Xylocaine without epinephrine and then connected the infusion device which had been previously flushed with  heparinized saline.  We then flushed this further after aspirating with heparinized saline and then connected the infusion device with the Silastic catheter and placed this in the subcutaneous pocket and then we checked for hemostasis.  There was minimal oozing.  This was controlled with cautery device and then we closed the subcutaneous layer with 3-0 Vicryl and then closed the incision with a subcuticular 5-0 Vicryl suture with Steri-Strips, 2 x 2 with Neosporin ointment and an OpSite dressing applied.  Prior to closure, all sponge, needle, and instrument counts were found to be correct.  Estimated blood loss was minimal.  The patient tolerated the procedure well and there were no complications.  Postoperatively, in the recovery room, we obtained a chest x-ray which showed that the catheter tip was in the superior vena cava and there was no pneumothorax.  We discussed discharge with the patient and his family and we have made followup arrangements to see this patient on Monday.  He is doing very well after the procedure and he has been instructed to resume his Coumadin as instructed by Dr. Dietrich Pates in the [facility] and if he have any questions regarding this he is to follow up with them.  I have made perioperative arrangements for following up his wound.     Barbaraann Barthel, M.D.  WB/MEDQ  D:  07/12/2010  T:  07/12/2010  Job:  [id number]  cc:   Ladona Horns. Mariel Sleet, MD Fax: [phone]  Gerrit Friends. Dietrich Pates, MD, [facility] [address] [place], [place] [address]  Electronically Signed by Barbaraann Barthel M.D. on 08/14/2010 02:59:28 PM

## 2010-07-22 ENCOUNTER — Inpatient Hospital Stay (HOSPITAL_COMMUNITY): Payer: Medicare Other

## 2010-07-23 ENCOUNTER — Inpatient Hospital Stay (HOSPITAL_COMMUNITY): Payer: Medicare Other

## 2010-07-24 ENCOUNTER — Encounter (HOSPITAL_COMMUNITY): Payer: Medicare Other | Attending: Oncology

## 2010-07-24 ENCOUNTER — Other Ambulatory Visit (HOSPITAL_COMMUNITY): Payer: Self-pay | Admitting: Oncology

## 2010-07-24 DIAGNOSIS — C8589 Other specified types of non-Hodgkin lymphoma, extranodal and solid organ sites: Secondary | ICD-10-CM

## 2010-07-24 DIAGNOSIS — Z5112 Encounter for antineoplastic immunotherapy: Secondary | ICD-10-CM

## 2010-07-24 DIAGNOSIS — C8291 Follicular lymphoma, unspecified, lymph nodes of head, face, and neck: Secondary | ICD-10-CM | POA: Insufficient documentation

## 2010-07-24 DIAGNOSIS — Z5111 Encounter for antineoplastic chemotherapy: Secondary | ICD-10-CM

## 2010-07-24 DIAGNOSIS — Z79899 Other long term (current) drug therapy: Secondary | ICD-10-CM | POA: Insufficient documentation

## 2010-07-25 ENCOUNTER — Encounter: Payer: Medicare Other | Admitting: *Deleted

## 2010-07-25 ENCOUNTER — Encounter: Payer: Self-pay | Admitting: *Deleted

## 2010-07-25 ENCOUNTER — Encounter (HOSPITAL_COMMUNITY): Payer: Medicare Other

## 2010-07-25 DIAGNOSIS — Z5111 Encounter for antineoplastic chemotherapy: Secondary | ICD-10-CM

## 2010-07-25 DIAGNOSIS — C8589 Other specified types of non-Hodgkin lymphoma, extranodal and solid organ sites: Secondary | ICD-10-CM

## 2010-07-31 ENCOUNTER — Encounter (HOSPITAL_COMMUNITY): Payer: Medicare Other | Admitting: Oncology

## 2010-07-31 ENCOUNTER — Ambulatory Visit (INDEPENDENT_AMBULATORY_CARE_PROVIDER_SITE_OTHER): Payer: Medicare Other | Admitting: *Deleted

## 2010-07-31 DIAGNOSIS — I4891 Unspecified atrial fibrillation: Secondary | ICD-10-CM

## 2010-07-31 DIAGNOSIS — Z7901 Long term (current) use of anticoagulants: Secondary | ICD-10-CM

## 2010-07-31 DIAGNOSIS — C8299 Follicular lymphoma, unspecified, extranodal and solid organ sites: Secondary | ICD-10-CM

## 2010-07-31 LAB — POCT INR: INR: 4.6

## 2010-08-06 NOTE — Op Note (Signed)
NAME:  Charles [name], Charles [name]             ACCOUNT NO.:  [id number]   MEDICAL RECORD NO.:  [id number]          PATIENT TYPE:  AMB   LOCATION:  DSC                          FACILITY:  [facility]   PHYSICIAN:  Loreta Ave, MD DATE OF BIRTH:  [birth year]   DATE OF PROCEDURE:  07/31/2008  DATE OF DISCHARGE:  07/31/2008                               OPERATIVE REPORT   PREOPERATIVE DIAGNOSIS:  Basal cell carcinoma, left chest.   POSTOPERATIVE DIAGNOSIS:  Basal cell carcinoma, left chest.   SURGEON:  Loreta Ave, MD   PROCEDURE PERFORMED:  Excision of left chest basal cell carcinoma and  local tissue rearrangement.   DRAINS:  Jackson-Pratt x1 on the left chest.   ESTIMATED BLOOD LOSS:  100 mL.   COMPLICATIONS:  None.   IV FLUIDS:  Per Anesthesia.   URINE OUTPUT:  Not recorded.   SPECIMENS:  Left chest lesion.   CLINICAL INDICATIONS:  Charles [name] is a 70 year old Caucasian male  with a recurrent left chest basal cell carcinoma.  He has had 2-3 basal  cell cancers excised from his left chest.  He has also had ongoing  destructive treatment to skin cancers in the same topographical area of  his left chest.  This has left him with a recurrent basal cell carcinoma  and it is located in a field of chronic skin damage from his previous  treatments.  He presents at this time with a recurrent basal cell  carcinoma and excision of the entire area of concern.   After discussion of the risks of surgery, which include but are not  limited to bleeding, infection, stiffness, scarring, positive margins,  failure to cure his recurring cancer, wound healing problems, and the  need for more surgery.  Charles [name] understands these risks and desires to  proceed.   DESCRIPTION OF THE OPERATION:  The patient was brought to the operating  room, placed in the supine position on the operating room table.  After  smooth and routine induction of general anesthesia with pharyngeal mask  airway, the  patient's chest was prepped with ChloraPrep and draped into  a sterile field.  The area of concern superior and medial to his left  nipple was outlined with the surgical marker and a 5-mm rim of normal  tissue was taken and drawn with a surgical pen.  Next, a rhomboid  excision was planned in order to close with a rhomboid flap.  The  rhomboid was 16 x 12 cm with 9 cm limbs.  20 mL of 0.5% Marcaine with  epinephrine were injected under and around the lesion.  After waiting  for the hemostatic effects of epinephrine to take place, the skin was  incised and the lesion was undermined in the superficial fat.  This was  done with Bovie electrocautery.  As the skin lesion was being removed  from the chest, it was marked with a long silk suture lateral and a  short silk suture superiorly and passed off the table labeled left chest  lesion.  Next, the possible rhomboids were outlined and the inferior and  lateral rhomboid to the defect  was chosen.  This included the nipple as  the patient was preoperatively warned.  It was felt that the inferior  and lateral rhomboid gave the best rotation from the relatively relaxed  chest sidewall skin.  The rhomboid was incised with a 10 blade and  elevated with electrocautery in the superficial fat.  Next, the entire  defect and along the margins of the rhomboid flap were dissected with  electrocautery.  This was for approximately 10 cm laterally, 15 cm  inferiorly, 15 cm medially, and 10 cm superiorly.  This was all done  with electrocautery.  The wound was irrigated with normal saline and  hemostasis verified.  A 19-French round Harrison Mons drain was placed via  separate stab incision at the most lateral inferior portion of the  subcutaneous wound.  This was secured to the skin with a silk suture.  Next, the rhomboid was transposed and found to lie across the defect  with minimal tension.  It was inset with interrupted buried 2-0 PDS  sutures in the dermis and  running 3-0 subcuticular Monocryl stitch.  Dermabond and Steri-Strips were applied.  The sponge and needle counts  were reported as correct x2.  The patient was extubated and transferred  to the recovery room in stable condition.      Loreta Ave, MD  Electronically Signed     CF/MEDQ  D:  07/31/2008  T:  08/01/2008  Job:  [id number]

## 2010-08-06 NOTE — Op Note (Signed)
NAME:  [name], [name] NO.:  [id number]   MEDICAL RECORD NO.:  [id number]          PATIENT TYPE:  INP   LOCATION:  5118                         FACILITY:  [facility]   PHYSICIAN:  Loreta Ave, MD DATE OF BIRTH:  [birth year]   DATE OF PROCEDURE:  08/01/2008  DATE OF DISCHARGE:                               OPERATIVE REPORT   PREOPERATIVE DIAGNOSIS:  Left chest hematoma status post skin cancer  excision and local tissue rearrangement yesterday.   POSTOPERATIVE DIAGNOSIS:  Left chest hematoma status post skin cancer  excision and local tissue rearrangement yesterday.   SURGEON:  Loreta Ave, MD   ASSISTANT:  None.   ANESTHESIA:  General endotracheal anesthesia.   ESTIMATED BLOOD LOSS:  100 mL of active bleeding, 900 mL of old hematoma  clot.   DRAINS:  Jackson-Pratt x1.   CLINICAL INDICATIONS:  Charles [name] is a 70 year old Caucasian male  who underwent excision of a large area of his left anterior chest  yesterday secondary to skin cancer.  He tolerated that procedure well  and was discharged home uneventfully the same day of surgery.  Over his  first postoperative night, he had increasing pain in the left chest and  his Jackson-Pratt drain was putting out 40-50 mL of bright red blood per  hour overnight.  His wife called the office concerned about the drain  output and his increasing pain.  She brought him to clinic on  postoperative day #1 where I saw him and felt that he had a  postoperative hematoma that would benefit from exploration, evacuation,  and control of bleeding.   After discussion of the risks of surgery which include but are not  limited to bleeding, infection, damage to the nearby structures, wound  healing problems, and the need for more, emergent surgery, [name] and  his wife understood the risks and desired to proceed.   DESCRIPTION OF OPERATION:  The patient was brought to the operating room  and placed in the supine  position on the operating room table.  After a  smooth and routine induction of general anesthesia, the left chest was  prepped with ChloraPrep after his old Jackson-Pratt drain was removed.  Before opening the skin, it was obvious that he had a large hematoma  under his tissue rearrangement.  The left chest was draped into a  sterile field and the incision was opened.  There was approximately 900  mL of old clot in the wound.  This was evacuated and the wound was  irrigated with normal saline.  Next, there were no discrete bleeding  blood vessels.  However, there was a generalized ooze from most raw  surfaces of the wound.  The wound was irrigated copiously multiple times  with normal saline and all bleeding surfaces were cauterized with Bovie  electrocautery.  Next, the wound was again irrigated once the entire  bleeding surfaces of the wound had been cauterized and found to be  relatively hemostatic.  Thrombin spray, one bottle, was sprayed onto the  surfaces of the wound.  Next, another 19 round Blake drain was placed  through the  old stab incision and secured to the skin with a silk suture.  The wound  was again closed with 2-0 buried PDS sutures in dermis and a running 3-0  subcuticular Monocryl stitch.  Sponge and needle counts were reported as  correct x2.  The patient was extubated and transported to recovery room  in stable condition.      Loreta Ave, MD  Electronically Signed     CF/MEDQ  D:  08/01/2008  T:  08/02/2008  Job:  [id number]

## 2010-08-06 NOTE — Discharge Summary (Signed)
NAME:  Charles [name], Charles [name]             ACCOUNT NO.:  [id number]   MEDICAL RECORD NO.:  [id number]          PATIENT TYPE:  INP   LOCATION:  A303                          FACILITY:  [facility]   PHYSICIAN:  Mila Homer. [name], M.D.DATE OF BIRTH:  1941/02/25   DATE OF ADMISSION:  10/30/2008  DATE OF DISCHARGE:  08/12/2010LH                               DISCHARGE SUMMARY   A 70 year old was admitted to the hospital with palpitations, which  turned out to be new-onset atrial fibrillation.  He had a benign 4-day  hospitalization extending from October 30, 2008 to November 02, 2008.  His  vital signs remained stable.   Admission white cell count 7100, hemoglobin 12.9, and platelet count  227,000.  CMP showed potassium 3.4, glucose 170, BUN 13, creatinine  0.87, and albumin was 3.9.  Cardiac panel was normal.  TSH 0.311,  recheck TSH was 0.484.  Free T4 at 0.90 and T3 was 2.9.  He had 3 INRs  done after starting warfarin and these went from 0.9 of 1.1.   Admission chest x-ray was normal.   He was admitted to the hospital and started on IV normal saline KVO, put  on NicoDerm 21 mg patch daily, and continue losartan 100 mg daily,  alprazolam 1 mg q.i.d. p.r.n. anxiety.   I asked with our cardiologists to see him in consult.  Later that, he  did require Cardizem drip to maintain his heart below 100.  He was then  put on alprazolam 1 mg q.i.d. p.r.n. anxiety and 2 mg nightly p.r.n.  Put on Lorcet Plus daily around-the-clock.   His second day IV diltiazem was discontinued and he was put on diltiazem  60 mg q.i.d. and started on warfarin 5 mg daily.  Palpitations resolved  on this.  IV was discontinued the following days, put on Hep-Lock and  ambulated, and was doing well enough his fourth day to be discharged  home.   He was discharged home on:  1. Hydrocodone APAP 7.5/650 q.i.d.  2. Alprazolam 2 mg nightly.  3. Lexapro 20 mg daily.  4. Nexium 40 mg daily.  5. He is to discontinue his tramadol and  his losartan 100 mg daily (he      is using this after being on the Benicar HCT).  6. He is to start on diltiazem ER 240 mg daily (30 with 11 refills)      and warfarin 5 mg daily (30 with 1 refill).   FINAL DISCHARGE DIAGNOSES:  1. New-onset atrial fibrillation causing palpitations.  2. Chronic pain involving in the right shoulder and the left chest      wall.  3. Chronic use of narcotics.  4. Reactive depression.  5. Tobacco use disorder.  6. Benign essential hypertension.  7. History of duodenal ulcer secondary to nonsteroidal      antiinflammatory drug.  8. Chronic anxiety.  9. Allergic rhinitis.  10.Extensive basal cell carcinoma of the left chest wall.   He will get follow up in our office in 5 days for another INR.  I will  see him at that time.  He is  to keep on his on narcotic medicines q.i.d.  religiously, but again the tramadol is to be discontinued.  I talked to  the patient's wife at length about this before his admission and she  will make sure his meds are being used this way or call me if there is  any problem.  Eventually this year, he will need to be off the narcotics  now that his chest wall is healing.      Mila Homer. [name], M.D.  Electronically Signed     SDK/MEDQ  D:  11/02/2008  T:  11/02/2008  Job:  [id number]

## 2010-08-06 NOTE — Op Note (Signed)
NAME:  Charles [name], Charles [name]             ACCOUNT NO.:  [id number]   MEDICAL RECORD NO.:  [id number]          PATIENT TYPE:  INP   LOCATION:  5013                         FACILITY:  [facility]   PHYSICIAN:  Katy Fitch. Sypher, M.D. 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   POSTOPERATIVE DIAGNOSES:  1. 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  2. Identification of extensive degenerative tendinopathy of the      subscapularis and complete rupture of the supraspinatus,      infraspinatus rotator cuff tendons, and an intact teres minor      tendon.   OPERATIONS:  1. Glenohumeral implant arthroplasty, right shoulder.  2. Extensive debridement of right glenohumeral joint removing      osteophytes, calcified labrum, and degenerative subscapularis      tendon.   OPERATING SURGEON:  Katy Fitch. Sypher, MD   ASSISTANT:  Marveen Reeks. Dasnoit, PA-C.   ANESTHESIA:  General endotracheal supplemented by a right interscalene  block.   SUPERVISING ANESTHESIOLOGIST:  Judie Petit, MD   INDICATIONS:  Charles [name] is a 70 year old gentleman referred  through the courtesy of Dr. Teola Bradley, Orthopedic Surgeon from  [place], [place] for evaluation of a chronically painful  rotator cuff deficient right shoulder.   [name] has had impairment of his rotator cuff for probably 70 years  and progressive pain and weakness of abduction and external rotation.  He has seen Dr. Hilda Lias and has had steroid injections.  He saw Dr.  Rennis Chris in 2007, for an orthopaedic consult and he was advised to have an  implant arthroplasty.  He was able to get by for 2 more years and  ultimately due to other family members and neighbors who are  familiar  with out office, sought an upper extremity orthopaedic consult at  [facility] on December 13, 2007.   At that time, he was noted to have a high-riding humeral head end-stage  glenohumeral arthritis and AC arthropathy.  An MRI of the shoulder was  reviewed documenting rupture of the rotator cuff, extensive glenohumeral  degenerative change and a medial subluxation of the long head biceps,  which was still intact.  There was extensive tendinopathy and  calcification of the subscapularis tendon.   Mr. [name] had reached a point, where he was unable to sleep  comfortably and could not use his arm in a functional manner.  He had  decided that he was ready to proceed with implant arthroplasty.   We had a lengthy informed consent on November 23, 2007, in which we  discussed the potential risks of surgery, which included infection,  anesthetic complication, failure to relieve all his pain, and possible  need to revise components as well as the benefits, which include pain  relief, improved sleep, improved function, and improvement in his  quality of life, and ability to function  in his activities of daily  living.   After a lengthy period of questions and answers, he elects to proceed  with surgery at this time.   Preoperatively, he was interviewed by Dr. Judie Petit, who  recommended a right interscalene block.  This was placed in the holding  area at [facility] without complication on the morning of surgery.   Questions were invited and answered in detail.  History and physical was  completed and a time-out was performed in a standard manner.   PROCEDURE:  [name] was brought to room one of the [facility]  operating room and placed in supine position upon the operating table.   Following an interscalene block in the holding area, anesthesia of the  right upper extremity and forequarter was satisfactory.   Under Dr. Randa Evens direct  supervision, general endotracheal anesthesia  was induced followed by careful positioning in the beach-chair position  with the aid of a torso and head holder designed specifically for  shoulder arthroplasty.   Impervious plastic drapes were applied to protect his head, chest, and  back followed by prep of the right forequarter and upper extremity with  DuraPrep.  Impervious arthroscopy drapes were applied with the arm  draped free with waterproof stockinette and Coban.   The procedure commenced with a 15 cm deltopectoral incision exposing the  interval between the deltoid and pectoralis major.  This was bluntly  developed followed by suture ligation of perforating vessels to the  pectoralis major.  The clavipectoral fascia was released followed by  mobilization of the cephalic vein in the lateral position, protecting  with a Radiographer, therapeutic.  The bursa was quite  hypertrophic and debrided with a rongeur and scissors dissection.  The  short head of the biceps was tenotomized with a cutting cautery and  tagged followed by development of plane deep to the coracoacromial  ligament and acromion with a Key elevator and a Cobb elevator.  After  retractors were carefully placed, the neck of the humerus was identified  and the inferior circumflex humeral vessels identified and suture  ligated with 2-0 silk.  The inferior capsule was released, taking care  to identify and protect the axillary nerve.   The cutting cautery was used to release the subscapularis.  The long  head of the biceps was degenerative and subluxed medially approximately  1 cm.  Care was taken to protect the long head of the biceps throughout  dissection.  After release of the inferior capsule, the arm was  externally rotated to 90 degrees and the head delivered.  Osteophytes  removed followed by resection of an appropriate amount of the humeral  head using a cutting guide.  After the joint was debrided, a  Fukuda  retractor was placed posterior to the glenoid and a footed glenoid  retractor anteriorly.   The glenoid was meticulously prepared for 4-mm inset polyethylene keeled  glenoid component that was ultimately placed with a no-touch technique  and cement compression.  Redundant cement was removed and the glenoid  was held in position for approximately 12 minutes as the cement cured.  Excess cement was removed followed by irrigation of the joint.   The proximal humerus was then carefully delivered and sequentially  reamed to a canal diameter of 12 mm and broached to 12 mm.  With a  broach in place, trial heads were used ultimately choosing a 54 x 24 mm  head.  Due to a slight prominence of  the posterior humeral head and the  cuff deficient status, we are unable to use the CTA head and rather used  a 54 x 24 offset head with the head longus axis placed at 1 o'clock when  viewing the humeral head with the humerus in external rotation.  A  congruous fit was achieved.   The trial component was removed followed by irrigation of the  intermedullary canal of the humerus with sterile saline.  The proper  stem was placed and impacted in position followed by placement of the  offset 54 x 24 mm head.  Both version and head position were corrected  to be anatomic followed by reduction of the joint.   The subscapularis was then repaired through drill holes through bone  with a superior and slightly lateral transfer to augment the suspension  bridge effect with the teres minor.   Range of motion was examined with elevation to 160 degrees, external  rotation 65, and internal rotation across the chest without difficulty.   The wound was then irrigated thoroughly followed by placement of 15-  French Hemovac drain brought through a stab wound laterally.  The  subcutaneous tissues were repaired with simple suture of 0-Vicryl  followed by subcutaneous repair with 2-0 Vicryl and dermal through   Prolene with Steri-Strips.  Our estimated blood loss was 350 mL.  No  replacement was provided.  There were no apparent complications.   Passive compression devices were used throughout the procedure for deep  vein thrombosis prophylaxis.  Mr. Scheer will be placed on his oral  aspirin therapy in the immediate postoperative period.   We intend to mobilize him immediately.      Katy Fitch Sypher, M.D.  Electronically Signed     RVS/MEDQ  D:  01/25/2008  T:  01/26/2008  Job:  [id number]   cc:   Mila Homer. [name], M.D.  Teola Bradley, M.D.

## 2010-08-06 NOTE — Group Therapy Note (Signed)
NAME:  Charles [name], Charles [name]             ACCOUNT NO.:  [id number]   MEDICAL RECORD NO.:  [id number]          PATIENT TYPE:  INP   LOCATION:  A303                          FACILITY:  [facility]   PHYSICIAN:  Mila Homer. [name], M.D.DATE OF BIRTH:  [birth year]   DATE OF PROCEDURE:  DATE OF DISCHARGE:                                 PROGRESS NOTE   SUBJECTIVE:  He feels somewhat better today.  Diltiazem he received last  night slowed his heart rate and he immediately did not note the effect.   OBJECTIVE:  Temperature is 98.2, pulse 86, respiratory rate 16, blood  pressure 135/81.  O2 sats 95% on room air.  He is sitting up in bed, no  acute distress, well developed, well nourished.  His wife is in  attendance.  His lungs are clear throughout, but decreased breath  sounds.  The heart has an irregular irregularity rate of about 80.  The  abdomen is benign, and there is no edema of the ankles.   His TSH was 0.311, which is slightly low.  CBC was normal.  CMP showed a  potassium of 3.4.  Cardiac panel was negative.  Chest x-ray negative.   ASSESSMENT:  1. Atrial fibrillation.  2. Possible hyperthyroidism.   PLAN:  Thyroid panel pending.  Continue the diltiazem by drip.  Cardiology to see him today and a cardiac echo was pending.      Mila Homer. [name], M.D.  Electronically Signed     SDK/MEDQ  D:  10/31/2008  T:  10/31/2008  Job:  [id number]

## 2010-08-06 NOTE — H&P (Signed)
NAME:  Charles [name], Charles [name]             ACCOUNT NO.:  [id number]   MEDICAL RECORD NO.:  [id number]          PATIENT TYPE:  INP   LOCATION:  A303                          FACILITY:  [facility]   PHYSICIAN:  Mila Homer. [name], M.D.DATE OF BIRTH:  12/03/1940   DATE OF ADMISSION:  10/30/2008  DATE OF DISCHARGE:  LH                              HISTORY & PHYSICAL   A 70 year old came to the office today due to generalized jitteriness.  This has been going on for several days.  His wife noted that in 3 days  he had used 40 Lorcet 7.5/650 tablets and also used about #12 Lexapro 20  mg tablets.  He just did not feel well.  Also it was felt he was  somewhat feverish.   He recently had pneumonia and seemed to be recovering from that.   He has had chronic right shoulder pain.  He had surgery on his right  shoulder in November 2009 to take care of a tendinitis, but is kept on  with pain medicine since then because he said he is kept on hurting.  Apparently he may be at times using more medication than he needs.  He  has also been on alprazolam 2 mg nightly and recently he was on Benicar  HCT 40/12.5 daily, but was switched instead the Cozaar 100 mg daily  within the last month.  He has used Ultram 50 mg up to 2 q.i.d., but  says he usually uses 6 a day.  He has been smoking off and on, but most  recently started smoking heavily again up to a pack a day.   He has also had basal cell carcinoma of the left chest.  This was quite  extensive, required a large excision with skin grafting.  Other  diagnoses include benign essential hypertension, tobacco use disorder,  chronic right shoulder pain as above, reactive depression, allergic  rhinitis, and a duodenal ulcer which he had around 2006 probably  secondary to NSAIDs.   ALLERGIES:  SULFA causes vomiting.  There is no other known allergies.   PHYSICAL EXAMINATION:  VITAL SIGNS:  Today the weight is 214, the BP was  144/62, the pulse of 80, but on  auscultation I could hear he had an  irregularity of the heart rate around 110.  CARDIAC:  No murmurs.  LUNGS:  Clear throughout, but decreased breath sounds throughout.  SKIN:  Skin turgor was normal.  Mucous membranes are moist.  NEUROLOGIC:  Speech was normal.  ABDOMEN:  Soft without organomegaly or mass.  CHEST:  He did have a large defect of scar tissue in the left chest from  his basal cell epithelioma removal.   EKG in the office showed atrial flutter with a rate around 150 at most  generally 110.   ADMISSION DIAGNOSES:  1. New onset atrial fibrillation.  2. Chronic pain involving the right shoulder.  3. Chronic use of narcotics with increased use just recently.  4. Reactive depression with increased use of Lexapro.  5. Tobacco use disorder.  6. Benign essential hypertension.  7. History of duodenal ulcer probably secondary to  nonsteroidal anti-      inflammatory drugs.  8. Allergic rhinitis.  9. Anxiety.   PLAN:  Admit him to the hospital on the monitor.  He will be on normal  s[facility] at [facility] and alprazolam 1 mg p.o. q.i.d. p.r.n. anxiety and I am  going to keep him on Lorcet Plus a tablet q.i.d. with a NicoDerm 21 mg  patch daily, losartan 100 mg daily.  Have Cardiology see him in consult.  Cardiac echo and chest x-ray are pending.  CBC, SMA-12, MET-7, and TSH  as well as cardiac enzymes are likewise pending.  Discussed all with the  patient and his wife.      Mila Homer. [name], M.D.  Electronically Signed     SDK/MEDQ  D:  10/30/2008  T:  10/31/2008  Job:  [id number]

## 2010-08-06 NOTE — Group Therapy Note (Signed)
NAME:  Charles [name], ROZA             ACCOUNT NO.:  [id number]   MEDICAL RECORD NO.:  [id number]          PATIENT TYPE:  INP   LOCATION:  A303                          FACILITY:  [facility]   PHYSICIAN:  Mila Homer. [name], M.D.DATE OF BIRTH:  07-08-1940   DATE OF PROCEDURE:  11/01/2008  DATE OF DISCHARGE:                                 PROGRESS NOTE   He feels better, in fact, slept all night last night after 2 mg of  alprazolam.  He is not complaining of palpitations.   OBJECTIVE:  VITAL SIGNS:  Temperature is 97.6, pulse 83, respiratory  rate 20, and blood pressure 128/71.  O2 sats 97% on room air.  GENERAL:  He is supine in bed.  No acute distress, well developed and  well nourished.  LUNGS:  Decreased breath sounds throughout, but are clear.  HEART:  An irregular irregularity, rate of around 80.  ABDOMEN:  Soft without tenderness.  EXTREMITIES:  There is no edema of the ankles.   ASSESSMENT:  1. New-onset atrial fibrillation.  2. Insomnia.  3. Chronic narcotic use.  4. Reactive depression.  5. Tobacco use disorder.  6. Benign essential hypertension.  7. Possible hyperthyroidism.  8. Anticoagulation.   PLAN:  T4, T3 are pending.  His TSH was 0.311, which is just slightly  above the lower limits of normal for TSH, so this actually may be normal  for him.  He is now off the Cardizem drip, on diltiazem 60 mg p.o. q.6  h. and he is now on Coumadin 5 mg daily.  INRs daily.  If his heart rate  is stable, he may be discharged tomorrow for followup with Dr. Dietrich Pates,  his cardiologist, and in the office by me for his pain medication.  Discussed his case with Dr. Dietrich Pates yesterday.  The patient will need  to be off pain meds totally in the near future and particularly since  his right shoulder is repaired now 9 months ago and the chest wall  surgery for extensive basal cell carcinoma that is now months ago.      Mila Homer. [name], M.D.  Electronically Signed     SDK/MEDQ  D:   11/01/2008  T:  11/01/2008  Job:  [id number]

## 2010-08-06 NOTE — Consult Note (Signed)
NAME:  Charles [name], Charles [name]             ACCOUNT NO.:  [id number]   MEDICAL RECORD NO.:  [id number]          PATIENT TYPE:  INP   LOCATION:  A303                          FACILITY:  [facility]   PHYSICIAN:  Gerrit Friends. Dietrich Pates, MD, FACCDATE OF BIRTH:  05-25-1940   DATE OF CONSULTATION:  10/31/2008  DATE OF DISCHARGE:                                 CONSULTATION   PRIMARY CARE PHYSICIAN:  Mila Homer. [name], MD   PRIMARY CARDIOLOGIST:  Should be new.   REASON FOR CONSULTATION:  New-onset atrial fibrillation.   HISTORY OF PRESENT ILLNESS:  A 70 year old, Caucasian male without any  prior history of cardiac disease.  He has been having panic attacks  over the last few months, last weekend very jittery, unable to eat,  sleep, sit still, complaining of flushing and sweating, and with change.   The patient has been on lot of medications at home since having had  basal cell carcinoma removed from chest was supple with postoperative  complications with skin grafts and infection.  The patient's panic  attacks have been worse since the surgery.  He is also narcotic  dependent on Vicodin for pain in his shoulder and over the skin graft  area.  The patient prior to admission took 39 Lorcet over 30 days and 12  Lexapro over 24 hours with symptoms increasing of anxiety and agitation.  The patient has also had recent steroid shot to his knees within the  last couple of weeks.  As a result of this, the patient was brought to  the emergency room where Dr. Juanetta Gosling was on consult and the patient was  admitted and was found to be in atrial fibrillation with RVR fascicular  of 115 beats per minute.  The patient was admitted, placed on a Cardizem  drip, low-molecular-weight heparin, and continued on his home  medications and Vicodin.  The patient currently is sleeping having not  slept for 2 days.  The patient's wife is giving most of the history.   REVIEW OF SYSTEMS:  Positive flushing, shortness of breath,  dyspnea on  exertion, complaints of overall generalized pain, cold intolerance,  jitteriness, anxiety, mood disturbance.  All other systems have been  reviewed and found to be negative other than those listed above.   PAST MEDICAL HISTORY:  1. Recent treatment for pneumonia.  2. Status post shoulder surgery November 2009 secondary to tendonitis      and has become pain medication dependent.  3. Hypertension.  4. Reactive depression.  5. Abdominal ulcers secondary to NSAIDs.  6. Ongoing tobacco abuse.  7. Recent basal cell carcinoma removal in May 2010.   PAST SURGICAL HISTORY:  Right shoulder surgery in November 2009, skin  cancer removed during Jul 24, 2008 with complications of infection and  bleeding postoperatively.   SOCIAL HISTORY:  He lives in [place] with his wife.  He mowes the  yard sometimes.  He is retired.  He is a 50-pack year smoker.  Negative  for EtOH or drug use.   FAMILY HISTORY:  Mother deceased from COPD and emphysema.  Father  deceased from MI.  He has 1 brother, who has a lung cancer and laryngeal  cancer.   CURRENT MEDICATIONS AT HOME:  1. Hydrocodone 7.5 mg q.6 h.  2. Benicar 40/12.5 daily.  3. Alprazolam 2 mg at bedtime.  4. Tramadol 100 mg q.6 h.  5. Lexapro 20 mg daily.  6. Nexium 40 mg daily.   HOSPITAL MEDS:  1. Low-molecular-weight heparin.  2. Vicodin.  3. Losartan 100 mg daily.  4. Nicotine patch 21 mg daily.  5. Cardizem drip at 5.   ALLERGIES:  No known drug allergies.   CURRENT LABORATORY:  Sodium 135, potassium 4.2, chloride 102, CO2 28,  BUN 14, creatinine 0.70, glucose 104, hemoglobin 12.8, hematocrit 36.9,  white blood cell 60.7, platelets 315.  TSH 0.311, troponin 0.05.  Chest  x-ray revealing no acute cardiomegaly and pulmonary process.  EKG  revealing AFib RVR with a rate of 150 beats per minute.   PHYSICAL EXAMINATION:  VITAL SIGNS:  Blood pressure 135/81, pulse 86,  respirations 60, temperature 98.2, O2 sat 95% on room  air.  GENERAL:  He is awake, alert, oriented.  Now that he is awake.  HEENT:  Head is normocephalic and atraumatic.  Eyes, PERRLA.  Mucous  membranes of mouth are pink and moist.  Tongue is midline.  NECK:  Supple.  There is no JVD.  No carotid bruits appreciated.  CARDIOVASCULAR:  Irregular rhythm, tachycardiac without murmurs, rubs,  or gallops.  Pulses are 2+ and equal without bruits.  LUNGS:  Clear to  auscultation with mild inspiratory wheezes noted.  ABDOMEN:  Soft, nontender with 2+ bowel sounds.  EXTREMITIES:  Without clubbing, cyanosis, or edema.  NEURO:  Cranial nerves II through XII are grossly intact.   IMPRESSION:  1. New onset atrial fibrillation.  2. Newly diagnosed hyperthyroidism.  3. Hypertension.  4. Tobacco abuse.  5. Anxiety and panic attacks.   PLAN:  This is a 70 year old Caucasian male with no prior cardiac  history.  He was admitted with jitteriness drug overdose and found to be  in atrial fibrillation.  The patient is currently on a Cardizem drip and  low-molecular-weight heparin.  We will continue Cardizem drip and we  will increase it to 10 mg an hour.  The patient was found to be  hypothyroid with a slight TSH of 0.311.  We will order a T3-T4, also  check echocardiogram for LV function, and continue current medication  regimen, may need beta-blocker if Cardizem does not assist in rate  control.  The patient's hyperthyroidism may be attributing to the atrial  fibrillation.  We will follow making further recommendations through our  hospital.   On behalf of the physicians and providers of [facility], we  would like to thank Dr. [name] for allowing [place] to participate in the  care of this patient.      Bettey Mare. Lyman Bishop, NP      Gerrit Friends. Dietrich Pates, MD, [facility]  Electronically Signed    KML/MEDQ  D:  10/31/2008  T:  11/01/2008  Job:  [id number]   cc:   Mila Homer. [name], M.D.  Fax: [phone]

## 2010-08-09 NOTE — H&P (Signed)
NAME:  PETROS, AHART             ACCOUNT NO.:  [id number]   MEDICAL RECORD NO.:  [id number]          PATIENT TYPE:  INP   LOCATION:  A303                          FACILITY:  [facility]   PHYSICIAN:  Mila Homer. [name], M.D.DATE OF BIRTH:  09-01-1940   DATE OF ADMISSION:  02/02/2005  DATE OF DISCHARGE:  [facility]                                HISTORY & PHYSICAL   This 70 year old man had been seen in the office this week for acute urinary  obstruction and rectal pain.  He had diarrhea and then just had irritation  of his rectum.  He was started on Levaquin 750 mg p.o. daily and had a Foley  catheter placed in the ER.  He had followup in the office a day or two prior  to admission.   CURRENT MEDICATIONS:  1.  Ultram 50 mg 4 times a day for chronic shoulder pain.  2.  Lexapro 10 mg daily for depression.  3.  Hydrocodone/APAP 7.5/650, as many as 3 times a day or 4 times a day for      the shoulder pain.  4.  Levaquin 750 mg daily.   He lives in [place] with his wife.  He has generally been healthy prior  to this.   ADMISSION PHYSICAL EXAMINATION:  GENERAL:  A man in a good deal of rectal  pain.  In fact, it has gotten so bad he could not stand therapies at home  anymore.  VITAL SIGNS: Temperature 98.1, pulse 93, respiratory rate 24, blood pressure  134/90.  At the time of my exam, he is oriented, alert.  His rectum is much better.  He is in no acute distress.  He is well-developed, well-nourished.  LUNGS:  Clear throughout.  HEART:  Regular rhythm without murmur.  ABDOMEN: Soft without hepatosplenomegaly or mass.  ADENOPATHY:  No axillary or supraclavicular adenopathy.  SKIN:  Color was pretty good.  There was no edema in the ankles.  Height was 68 inches, weight 204 pounds.   The pH was 7.4, PCO2 40, bicarb 30.  White cell count 9600 with 73  neutrophils, 8 lymphs, hemoglobin 12, hematocrit 37.  MET-7 shows potassium  of 3.4.   He did have CT of the abdomen which report was not  finally read by radiology  at the time of my exam.   The patient has history of rectal exam.  At this time showed tenderness and  some swelling for which viscous lidocaine helped a lot.   ASSESSMENT:  1.  Possible early perirectal abscess.  2.  Acute urinary obstruction.  3.  Chronic shoulder problems.   PLAN:  1.  He is currently on Cipro 400 mg IV twice daily, Flagyl 5 mg IV twice      daily, Dilaudid 1 mg q.3-4 h. for pain, and lidocaine jelly 2%, 10 mL      for rectal pain q.6 h.  He is on Lexapro 10 mg daily.  2.  Will check the official radiology report on the abdomen in the morning      since he is markedly improved today.  Mila Homer. [name], M.D.  Electronically Signed     SDK/MEDQ  D:  02/02/2005  T:  02/02/2005  Job:  [id number]

## 2010-08-09 NOTE — Consult Note (Signed)
NAME:  Charles [name], Charles [name]             ACCOUNT NO.:  [id number]   MEDICAL RECORD NO.:  [id number]          PATIENT TYPE:  INP   LOCATION:  A303                          FACILITY:  [facility]   PHYSICIAN:  Lionel December, M.D.    DATE OF BIRTH:  1941/01/11   DATE OF CONSULTATION:  02/03/2005  DATE OF DISCHARGE:                                   CONSULTATION   REFERRING PHYSICIAN:  Dr. Renard Matter.   REASON FOR CONSULTATION:  Severe proctalgia.   HISTORY OF PRESENT ILLNESS:  Charles [name] is a 70 year old Caucasian male who  about 10 days ago developed diarrhea. He began to take Imodium for this. He  then became constipated. He complained of irritation to his rectum and  severe proctalgia. He felt like he was having spasms. He did not have a  bowel movement in seven or eight days. He also developed urinary obstruction  and was seen in the emergency room where he had a Foley catheter placed. He  was started on Levaquin 750 milligrams daily; prior to this, he had been  given Septra which caused nausea and vomiting. He had also had one episode  of vomiting prior to this. He denies any hematemesis. He continued to  complain of severe rectal pain where he could not stand or sit. He describes  a stabbing sensation to his anus. Last week, he noticed two episodes of  bright red rectal bleeding in small amounts on a toilet tissue. He has never  had a colonoscopy. He has complaint of some crampy bilateral lower quadrant  abdominal pain as well as significant abdominal bloating and distension. He  takes one Lorcet three to four times a day and one to two Ultram up to four  times a day for chronic shoulder pain. Prior to this, he had been having  daily bowel movements and never had a problem with significant constipation.   His CT from yesterday revealed significant constipation of small and large  bowel with mild intrahepatic ductal dilatation and fecal impaction as well  as presacral soft tissue density. He  has been started on Cipro 400  milligrams b.i.d. as well as Flagyl 500 milligrams b.i.d.   [name] complains of other GI symptoms. Towards the end of the August,  he had developed severe GERD-type symptoms. He was having acid reflux and  heartburn. He had tried Tums as well as Prilosec with minimal relief. He had  an upper GI series on November 12, 2004 which revealed severe duodenitis. He  had several intermittent episodes of nausea and vomiting. He does take Goody  powders on a regular basis. He denies any dysphagia or odynophagia.   PAST MEDICAL HISTORY:  1.  Chronic shoulder pain.  2.  Depression.  3.  Hypertension.  4.  Urinary retention with Foley placement for enlarged prostate.  5.  Left knee surgery.  6.  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   MEDICATIONS:  1.  Ultram 50 milligrams 1-2 q.i.d.  2.  Lexapro 10 milligrams daily.  3.  Lorcet 7.5/650 milligrams t.i.d. or q.i.d.  4.  Levaquin 750 milligrams daily.  5.  Xanax p.r.n.  6.  Saw palmetto once daily.  7.  Benicar/HCTZ 40/12.5 milligrams daily.   ALLERGIES:  SULFA which causes nausea and vomiting.   FAMILY HISTORY:  History of colorectal carcinoma and other chronic GI  problems. Mother deceased at age 84 secondary to COPD. Father deceased at  age 63 secondary to coronary artery disease and MI. He has four siblings who  are relatively healthy. One daughter with Hodgkin's disease, currently in  remission.   SOCIAL HISTORY:  Mr. Lodge lives with his wife of 47 years. He has three  grown healthy children. He is retired Art gallery manager. He reports  a 50 year history of tobacco use, currently smoking about a quarter of a  pack a day. Denies any alcohol or drug use.   REVIEW OF SYSTEMS:  CONSTITUTIONAL:  He did have low grade fever around 100  with some chills. See HPI.  CARDIOVASCULAR:  Denies any chest pain or  palpitations. PULMONARY:  Denies history of cough or hemoptysis. GI:   See  HPI. GU:  He was having some dysuria as well as decreased urinary frequency.  See HPI. He denies any hematuria.   PHYSICAL EXAMINATION:  VITAL SIGNS:  Temperature 98.9, pulse 93,  respirations 25, blood pressure 154/94.  GENERAL:  Charles [name] is a 70 year old Caucasian male who is pleasant,  cooperative and in no acute distress.  HEENT:  Sclerae are clear. Conjunctivae pink. Oropharynx. Moist mucus  membranes.  NECK:  Supple without any masses or thyromegaly.  HEART:  Regular rate and rhythm. Normal S1 and S2 without murmurs, gallops,  or rubs.  LUNGS:  Clear to auscultation bilaterally.  ABDOMEN:  Slightly distended. Positive bowel sounds x4. No bruits  auscultated. Soft, nontender, and nondistended without palpable masses or  hepatosplenomegaly. No rebound tenderness or guarding.  RECTAL:  No external lesions were visualized. Good sphincter tone  anteriorly. He has extremely 2 to 3 cm raised soft tissue lesion easily  palpable. No other lesions identified.  EXTREMITIES:  Without edema bilaterally.  SKIN:  Pink, warm and dry without rashes or jaundice.   LABORATORY DATA:  WBC 9.6, hemoglobin 12.6, hematocrit 37, platelets  392,000. Sodium 136, potassium 3.4, chloride 101, BUN 33, creatinine 1.3,  and glucose 118.   IMPRESSION:  [name] is a 70 year old Caucasian male with a history of  diarrhea followed by constipation and severe proctalgia. He had went about  seven or eight days without a bowel movement. I suspect he had fecal  impaction with spurious diarrhea. CT confirmed fecal impaction and also  small soft tissue density in the presacral space. Ms. Dessert was started on  Cipro b.i.d. and Flagyl b.i.d. He has never had a colonoscopy. He is on  significant narcotic therapy for chronic shoulder pain which I suspect may  be the culprit of his constipation/impaction with subsequent thrombosed hemorrhoid palpated on exam. He has since had resolution of his impaction   and significant decrease in his pain.   Secondly, he is had an abnormal duodenum on recent upper GI and CT. He has  had symptoms of nausea, vomiting, acid reflux, heartburn, anorexia. He takes  multiple Goody powders each week for chronic pain. He is going to need  esophagogastroduodenoscopy plus/minus duodenoscopy to rule out ulcer and  duodenitis.   PLAN:  1.  Add Protonix 40 milligrams daily.  2.  Sitz baths b.i.d.  3.  Peri-Colace b.i.d.  4.  EGD by  Dr. Karilyn Cota tomorrow which has been already been discussed. The      patient has understands the risks and benefits which include but are not      limited to bleeding, infection, perforation, and drug reaction. He      agrees and informed consent will be obtained.  5.  He is going to need colonoscopy in one to two weeks once his proctalgia      has resolved.  6.  Clear liquid diet for breakfast and then n.p.o. for procedure.   We would like to thank Dr. Renard Matter for allowing [place] to participate in the  care of Mr. Murley.      Nicholas Lose, N.P.      Lionel December, M.D.  Electronically Signed    KC/MEDQ  D:  02/03/2005  T:  02/04/2005  Job:  [id number]   cc:   Angus G. Renard Matter, MD  Fax: [phone]

## 2010-08-09 NOTE — Op Note (Signed)
NAME:  LOVELLE, LEMA             ACCOUNT NO.:  [id number]   MEDICAL RECORD NO.:  [id number]          PATIENT TYPE:  INP   LOCATION:  A303                          FACILITY:  [facility]   PHYSICIAN:  Lionel December, M.D.    DATE OF BIRTH:  08-31-1940   DATE OF PROCEDURE:  02/04/2005  DATE OF DISCHARGE:                                 OPERATIVE REPORT   PROCEDURE:  Esophagogastroduodenoscopy.   INDICATIONS:  Johnwilliam is a 70 year old Caucasian male who was admitted with  excruciating anorectal pain felt to secondary to thrombosed internal  hemorrhoids. He also had fecal impaction that has been treated. He had  abdominopelvic CT yesterday which showed dilated segment of duodenum and  loss of tissue planes between duodenum and head of pancreas. Therefore, he  is undergoing this study to evaluate this area. He has been using a lot of  Goody's powders, but he denies epigastric pain, nausea, vomiting, or melena.  Procedure risks were reviewed with the patient and informed consent was  obtained.   PREOPERATIVE MEDICATIONS:  Cetacaine spray for pharyngeal topical  anesthesia. Demerol 50 mg IV, Versed 6 mg IV in divided dose.   FINDINGS:  Procedure performed in endoscopy suite. The patient's vital signs  and O2 saturation were monitored during the procedure and remained stable.  The patient was placed in left lateral position, and Olympus videoscope was  passed via oropharynx without any difficulty into the esophagus.   Esophagus. Mucosa of the esophagus was normal. There was a coating of the  distal esophageal mucosa of some food debris. GE junction was at 40 cm from  the incisors and was wavy.   Stomach. It was empty and distended very well with insufflation. Folds of  proximal stomach were normal. Examination of mucosa revealed multiple antral  erosions. Angularis, fundus and cardia was examined by retroflexing the  scope and were normal.   On entering the bulb, there was bulbar erythema,  edema, and large deep  excavated ulcer along the medial wall distally, measured about 2 x 3 cm.  There is some coffee-ground material, but there was no active bleeding.  Scope was passed to the post bulbar duodenum  where mucosa was erythematous  with a few linear erosions, but no stricture or mass was noted. Biopsies  were taken from ulcer margin. Endoscope was withdrawn. The patient tolerated  the procedure well.   FINAL DIAGNOSES:  1.  Large deep postbulbar ulcer with surrounding mucosal edema and erythema.      The ulcer margin was biopsied for histology. 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  2.  Erosive antral gastritis.   RECOMMENDATIONS:  1.  We will increase her Protonix to 40 mg p.o. b.i.d.  2.  Helicobacter pylori serology. Will also have LFTs today.  3.  The patient advised not to take Goody's Powder or other OTC NSAIDs.  4.  The patient will need followup EGD in 10 weeks or so to document healing      of this rather very large ulcer. He will also undergo colonoscopy at  that time.      Lionel December, M.D.  Electronically Signed     NR/MEDQ  D:  02/04/2005  T:  02/04/2005  Job:  [id number]

## 2010-08-09 NOTE — Op Note (Signed)
NAME:  Charles [name], Charles [name]             ACCOUNT NO.:  [id number]   MEDICAL RECORD NO.:  [id number]          PATIENT TYPE:  AMB   LOCATION:  DAY                           FACILITY:  [facility]   PHYSICIAN:  Lionel December, M.D.    DATE OF BIRTH:  11-Oct-1940   DATE OF PROCEDURE:  04/09/2005  DATE OF DISCHARGE:                                 OPERATIVE REPORT   PROCEDURE:  Esophagogastroduodenoscopy, followed by colonoscopy.   INDICATIONS:  Homero Fellers is a 70 year old Caucasian male who was hospitalized in  November 2006 and found to have a very, large deep bulbar and postbulbar  ulcer.  Histology was negative.  He has been treated with a PPI and he also  was given 10 days of H. pylori therapy.  He is now asymptomatic.  He is  undergoing this exam to document complete healing of this large ulcer.  He  will also undergo screening colonoscopy.  The procedure risks were reviewed  the patient, informed consent was obtained.   MEDICINES FOR CONSCIOUS SEDATION:  Cetacaine spray, pharyngeal topical  anesthesia, Demerol 50 mg IV, Versed 8 mg IV in divided dose.   FINDINGS:  Procedure performed in endoscopy suite.  The patient's vital  signs and O2 saturation were monitored during the procedure and remained  stable.   PROCEDURE #1:  Esophagogastroduodenoscopy.   The patient was placed in the left lateral recumbent position and Olympus  video scope was passed via oropharynx without any difficulty into esophagus.   Esophagus:  Mucosa of the esophagus was normal throughout.  GE junction was  at 43 cm from the incisors.   Stomach:  It was empty and distended very well with insufflation.  Folds of  the proximal stomach were normal.  Examination of mucosa at body, antrum,  pyloric channel as well as angularis, fundus and cardia was normal.   Duodenum:  Examination of bulb revealed some thick scars distally along the  anterior wall.  One folds was edematous but no masses noted.  There was  noncritical  narrowing of this area.  The scope was passed into the  postbulbar duodenum.  There was another small scar but the rest of the  mucosa and folds were normal.  The endoscope was withdrawn and the patient  prepared for procedure #2.   PROCEDURE #2:  Colonoscopy.   A rectal examination performed.  No abnormality noted external or digital  exam.  The Olympus video scope was placed in the rectum and advanced under  vision into sigmoid colon and beyond.  Preparation was excellent.  The scope  was passed to the cecum, which was identified by appendiceal orifice and  ileocecal valve.  As the scope was withdrawn, colonic mucosa was carefully  examined.  There were no polyps and/or masses.  There were a few tiny  diverticula at sigmoid colon.  Rectal mucosa was normal.  The scope was  retroflexed to examine the anorectal junction, which was unremarkable.  The  endoscope was straightened and withdrawn.  The patient tolerated the  procedure well.   FINAL DIAGNOSES:  1.  Bulbar/postbulbar ulcer has completely healed.  Residual duodenitis.  2.  Few small diverticula at sigmoid colon, otherwise normal colonoscopy.   RECOMMENDATIONS:  1.  The patient will drop his Protonix 40 mg p.o. q.a.m.  2.  If he needs to be low-dose ASA I do not see any contraindications, but      he should stay away from other NSAIDs.  3.  High-fiber diet.      Lionel December, M.D.  Electronically Signed     NR/MEDQ  D:  04/09/2005  T:  04/09/2005  Job:  [id number]

## 2010-08-09 NOTE — Discharge Summary (Signed)
NAME:  Charles [name], Charles [name]             ACCOUNT NO.:  [id number]   MEDICAL RECORD NO.:  [id number]          PATIENT TYPE:  INP   LOCATION:  A303                          FACILITY:  [facility]   PHYSICIAN:  Mila Homer. [name], M.D.DATE OF BIRTH:  December 24, 1940   DATE OF ADMISSION:  02/02/2005  DATE OF DISCHARGE:  11/15/2006LH                                 DISCHARGE SUMMARY   This 70 year old man was admitted to the hospital with severe rectal pain.   He had a benign four-day hospitalization. Vital signs remained stable.   His admission CBC showed a white cell count 9,600, H and H 12.0 and 35.8,  MCV of 84. Hepatic function showed albumin of 2.7, creatinine of 1.3. ABG  showed pH of 7.48, pCO2 of 40.   He had a CT of the abdomen and pelvis and was felt to have mild intrahepatic  ductal dilatation with obvious constipation and some thickening in the  duodenal area. Soft tissue density was also noted in the presacral space,  but no abscesses were noted.   He was admitted to the hospital with a question of perirectal infection, put  on a regular diet, vital signs q.i.d., IV normal saline at 100 cc an hour,  and Cipro 400 mg IV b.i.d., Flagyl 500 mg IV b.i.d. with Dilaudid 1 mg every  3 to 4 hours for pain, lidocaine hydrochloride jelly 2% 10 mL p.r.n. rectal  pain q.6h, Lexapro 10 mg daily. He was put on KCL 20 mEq daily due to low  potassium. GI was consulted the second day due to severe and persistent pain  and cramping in the rectum. Dilaudid was increased to 2 mg IV q.3-4h., and  he was given the viscous lidocaine per rectum as before.   Arrangements were made for an EGD his third day after he had his rectal  clean out. A large duodenal ulcer was found at this time. He was put on  Protonix 40 mg p.o. daily, Peri-Colace b.i.d., Citrucel daily, clear liquid  diet and after __________ increase to b.i.d.   Later that afternoon, he was doing much better. His Foley was discontinued.  He was up  in a chair and ambulating, and IV was discontinued, and it was  heplocked.   He was able to urinate adequately after this and passed his bowels, and the  following day, he was walking well, color was good, his energy looked good,  and he was ready for discharge home.   FINAL DIAGNOSES:  1.  Duodenal ulcer.  2.  Severe constipation.  3.  Rectal pain due to passive venous congestion from constipated stool      effects on the mucosa.  4.  Chronic right shoulder pain.  5.  Chronic use of aspirin.  6.  Depression.   I talked to both the patient and his wife. We will discharge him home on  Lorcet Plus q.i.d. for his right shoulder pain (100 with 2 refills). He is  follow up with Dr. Karilyn Cota and Dr. Jena Gauss in about 10 weeks for repeat EGD to  assess healing of his duodenal ulcer and  also getting a TCS at that time. He  is to continue with Lexapro 10 mg daily, Colace 100 mg b.i.d., and start on  Protonix 40 mg b.i.d. from office. Followup will be in my office within a  week.      Mila Homer. [name], M.D.  Electronically Signed     SDK/MEDQ  D:  02/05/2005  T:  02/05/2005  Job:  [id number]

## 2010-08-09 NOTE — Group Therapy Note (Signed)
NAME:  Charles [name], Charles [name]             ACCOUNT NO.:  [id number]   MEDICAL RECORD NO.:  [id number]          PATIENT TYPE:  INP   LOCATION:  A303                          FACILITY:  [facility]   PHYSICIAN:  Mila Homer. [name], M.D.DATE OF BIRTH:  12-Aug-1940   DATE OF PROCEDURE:  02/04/2005  DATE OF DISCHARGE:                                   PROGRESS NOTE   SUBJECTIVE:  The patient is feeling a good deal better and his rectal spasms  have cleared.  Yesterday, he passed an enormous volume of feces.  He did  have his EGD this morning through Dr. Karilyn Cota and based on my personal  communication with him, he had a large duodenal bulbar ulcer felt to be  secondary to aspirin use (biopsy of this was done at any rate).   OBJECTIVE:  VITAL SIGNS:  Temperature 98.7, pulse 97, respirations 16, blood  pressure 122/80.  GENERAL:  He is supine in bed in no acute distress.  Well-developed, well-  nourished, oriented and alert.  LUNGS:  Clear throughout.  HEART:  Regular rate and rhythm with rate of 80.  ABDOMEN:  Soft without hepatosplenomegaly or mass.  EXTREMITIES:  There is no edema of the ankles.   His LFTs were okay, but albumin slightly low at 2.7.   ASSESSMENT:  I believe the patient has been taking aspirin due to his  chronic right shoulder pain and this caused a duodenal bulbar ulcer which in  turn may have caused an ileus causing the constipation that caused  congestion of the rectal mucosa and my have somehow influenced his acute  urinary obstruction as well.   PLAN:  Talked to the patient and his wife at length about staying away from  aspirin.  He will need a TCS in 6-8 weeks.  Long-term, his right shoulder  needs to be repaired and we will plan for this in the next several months to  decrease his need for pain medications.  Meanwhile today, we will  discontinue his Foley catheter and get him up and around.  Also, discontinue  his IV and switch to Hep-lock.  Hopefully, will be discharged  tomorrow.      Mila Homer. [name], M.D.  Electronically Signed     SDK/MEDQ  D:  02/04/2005  T:  02/04/2005  Job:  [id number]

## 2010-08-09 NOTE — H&P (Signed)
NAME:  MORRISON, MCBRYAR             ACCOUNT NO.:  [id number]   MEDICAL RECORD NO.:  [id number]          PATIENT TYPE:  AMB   LOCATION:                                FACILITY:  [facility]   PHYSICIAN:  Lionel December, M.D.    DATE OF BIRTH:  13-Jan-1941   DATE OF ADMISSION:  DATE OF DISCHARGE:  LH                                HISTORY & PHYSICAL   CHIEF COMPLAINT:  Follow up of hospitalization.   HISTORY OF PRESENT ILLNESS:  Mr. Goodpasture is a 70 year old Caucasian  gentleman who was seen during hospitalization in November 2006. At that  time, he presented with diarrhea followed by constipation and severe  proctalgia. He was felt to have fecal impaction with spurious diarrhea. He  also had a thrombosed hemorrhoid. A CT revealed abnormal duodenum. He  subsequently had an EGD on February 04, 2005. He had a large deep postbulbar  ulcer with surrounding mucosal edema and erythema. Biopsies were negative  for malignancy. He had erosive antral gastritis. Helicobacter pylori  serologies were positive. He has not been treated. He has been doing very  well. Bowel movements are now very regular. He is having a bowel movement  every day. Denies any heartburn, nausea, vomiting, abdominal pain, melena or  rectal bleeding.   CURRENT MEDICATIONS:  1.  Ultram 50 mg 1 to 2 four times a day.  2.  Lexapro 10 mg daily.  3.  Lorcet 7.5/650 mg q.i.d.  4.  Xanax 0.5 mg p.r.n.  5.  Saw palmetto 1 daily.  6.  Benicar/hydrochlorothiazide 40/12.5 mg daily.  7.  Protonix 40 mg b.i.d.   ALLERGIES:  LEVAQUIN caused nausea and vomiting. Previously, he reported  SEPTRA caused nausea and vomiting as well.   PAST MEDICAL HISTORY:  1.  Chronic shoulder pain.  2.  Depression.  3.  Hypertension.  4.  Urinary retention due to enlarged prostate requiring Foley placement      previously.  5.  Left knee surgery.  6.  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   FAMILY HISTORY:  Mother deceased at age  40 due to COPD. Father died at age  63 secondary to coronary artery disease and MI. One daughter had Hodgkin's  disease, currently in remission.   SOCIAL HISTORY:  Lives with his wife of 47 years. They have three grown  healthy children. He is a retired Art gallery manager. Reports a 50-  year history of tobacco use. Currently smoking about a fourth of a pack of  cigarettes daily. Denies any alcohol or drug use.   REVIEW OF SYSTEMS:  GASTROINTESTINAL:  See HPI for GI. CONSTITUTIONAL:  No  weight loss. CARDIOPULMONARY:  No chest pain or shortness of breath.   PHYSICAL EXAMINATION:  VITAL SIGNS:  Weight 211 pounds, height 5 foot 8,  temperature 97.7, blood pressure 142/80, pulse 96.  GENERAL:  Pleasant, well-developed, well-nourished, Caucasian male in no  acute distress.  SKIN:  Warm and dry. No jaundice.  HEENT:  Conjunctivae are pink. Sclerae are nonicteric. Oropharyngeal mucosa  moist and pink. No lesions, erythema  or exudate. No lymphadenopathy or  thyromegaly.  CHEST:  Lungs are clear to auscultation.  CARDIAC:  Reveals regular rate and rhythm. Normal S1 and S2. No murmurs,  rubs, or gallops.  ABDOMEN:  Positive bowel sounds, protuberant but symmetric, soft, nontender.  No organomegaly or masses. No rebound tenderness or guarding.  EXTREMITIES:  No edema.   IMPRESSION:  Mr. Redmon is a 70 year old Caucasian gentleman who recently  was found to have large post bulbar ulcer, antral gastritis, positive  Helicobacter pylori during recent hospitalization. He has also had some  constipation with hematochezia and has never had a colonoscopy. He will be  due for a followup EGD to verify healing of this very large, deep, post  bulbar ulcer at the end of January. He is interested in proceeding with  colonoscopy at that time as well. He has not been treated for Helicobacter  pylori. He will undergo treatment now.   PLAN:  1.  EGD and colonoscopy end of January 2007.  2.   Continue Protonix 40 mg b.i.d. #60 with 3 refills given.  3.  Begin Biaxin 500 mg p.o. b.i.d. for 10 days and amoxicillin 1 g p.o.      b.i.d. for 10 days, prescriptions provided with no refills.  4.  Continue to avoid NSAIDs and aspirin.      Tana Coast, P.A.      Lionel December, M.D.  Electronically Signed    LL/MEDQ  D:  03/06/2005  T:  03/06/2005  Job:  [id number]   cc:   Mila Homer. [name], M.D.  Fax: [phone]

## 2010-08-09 NOTE — Group Therapy Note (Signed)
NAME:  Charles [name], Charles [name]             ACCOUNT NO.:  [id number]   MEDICAL RECORD NO.:  [id number]          PATIENT TYPE:  INP   LOCATION:  A303                          FACILITY:  [facility]   PHYSICIAN:  Mila Homer. [name], M.D.DATE OF BIRTH:  11-27-1940   DATE OF PROCEDURE:  02/03/2005  DATE OF DISCHARGE:                                   PROGRESS NOTE   SUBJECTIVE:  The patient is still having a lot of rectal pain and spasm.   OBJECTIVE:  VITAL SIGNS:  Temperature 98.9, pulse 93, respirations 35, but  was 20, BP 154/94.  GENERAL:  Color is good.  He is well-developed, well-nourished in severe  distress due to rectal pain.  LUNGS:  Clear throughout.  HEART:  Regular rate and rhythm of 90.  ABDOMEN:  Soft without hepatosplenomegaly or mass.  He does have lower  abdominal tenderness.  EXTREMITIES:  No edema of the ankles.   ASSESSMENT:  Perirectal pain, question from abscess.   PLAN:  Will review CT with radiologist this morning.  Presently, it has not  been available in the electronic record.  Continue with pain medications and  viscus Xylocaine if helpful.  Discuss briefly with GI in case we need their  help.      Mila Homer. [name], M.D.  Electronically Signed     SDK/MEDQ  D:  02/03/2005  T:  02/03/2005  Job:  [id number]

## 2010-08-12 ENCOUNTER — Ambulatory Visit (INDEPENDENT_AMBULATORY_CARE_PROVIDER_SITE_OTHER): Payer: Medicare Other | Admitting: *Deleted

## 2010-08-12 ENCOUNTER — Other Ambulatory Visit (HOSPITAL_COMMUNITY): Payer: Medicare Other

## 2010-08-12 DIAGNOSIS — Z7901 Long term (current) use of anticoagulants: Secondary | ICD-10-CM

## 2010-08-12 DIAGNOSIS — I4891 Unspecified atrial fibrillation: Secondary | ICD-10-CM

## 2010-08-12 LAB — POCT INR: INR: 2.9

## 2010-08-13 ENCOUNTER — Other Ambulatory Visit (HOSPITAL_COMMUNITY): Payer: Self-pay | Admitting: Oncology

## 2010-08-13 ENCOUNTER — Encounter (HOSPITAL_COMMUNITY): Payer: Medicare Other

## 2010-08-13 DIAGNOSIS — C8589 Other specified types of non-Hodgkin lymphoma, extranodal and solid organ sites: Secondary | ICD-10-CM

## 2010-08-13 DIAGNOSIS — Z5112 Encounter for antineoplastic immunotherapy: Secondary | ICD-10-CM

## 2010-08-13 DIAGNOSIS — Z5111 Encounter for antineoplastic chemotherapy: Secondary | ICD-10-CM

## 2010-08-13 LAB — CBC
HCT: 36.2 % — ABNORMAL LOW (ref 39.0–52.0)
Hemoglobin: 11.9 g/dL — ABNORMAL LOW (ref 13.0–17.0)
MCV: 89.2 fL (ref 78.0–100.0)
RBC: 4.06 MIL/uL — ABNORMAL LOW (ref 4.22–5.81)
WBC: 3.5 10*3/uL — ABNORMAL LOW (ref 4.0–10.5)

## 2010-08-13 LAB — COMPREHENSIVE METABOLIC PANEL
ALT: 15 U/L (ref 0–53)
Alkaline Phosphatase: 104 U/L (ref 39–117)
BUN: 18 mg/dL (ref 6–23)
CO2: 25 mEq/L (ref 19–32)
Chloride: 105 mEq/L (ref 96–112)
Glucose, Bld: 93 mg/dL (ref 70–99)
Potassium: 4.3 mEq/L (ref 3.5–5.1)
Sodium: 139 mEq/L (ref 135–145)
Total Bilirubin: 0.2 mg/dL — ABNORMAL LOW (ref 0.3–1.2)
Total Protein: 6.6 g/dL (ref 6.0–8.3)

## 2010-08-13 LAB — DIFFERENTIAL
Basophils Absolute: 0.1 10*3/uL (ref 0.0–0.1)
Eosinophils Relative: 7 % — ABNORMAL HIGH (ref 0–5)
Lymphocytes Relative: 11 % — ABNORMAL LOW (ref 12–46)
Lymphs Abs: 0.4 10*3/uL — ABNORMAL LOW (ref 0.7–4.0)
Neutro Abs: 2.2 10*3/uL (ref 1.7–7.7)
Neutrophils Relative %: 63 % (ref 43–77)

## 2010-08-13 LAB — LACTATE DEHYDROGENASE: LDH: 181 U/L (ref 94–250)

## 2010-08-14 ENCOUNTER — Encounter (HOSPITAL_COMMUNITY): Payer: Medicare Other

## 2010-08-14 DIAGNOSIS — C8589 Other specified types of non-Hodgkin lymphoma, extranodal and solid organ sites: Secondary | ICD-10-CM

## 2010-08-14 DIAGNOSIS — Z5111 Encounter for antineoplastic chemotherapy: Secondary | ICD-10-CM

## 2010-08-16 ENCOUNTER — Ambulatory Visit (HOSPITAL_COMMUNITY): Payer: Medicare Other | Admitting: Oncology

## 2010-08-16 ENCOUNTER — Encounter (HOSPITAL_COMMUNITY): Payer: Medicare Other | Admitting: Oncology

## 2010-08-16 ENCOUNTER — Other Ambulatory Visit (HOSPITAL_COMMUNITY): Payer: Self-pay | Admitting: Oncology

## 2010-08-16 DIAGNOSIS — C8290 Follicular lymphoma, unspecified, unspecified site: Secondary | ICD-10-CM

## 2010-08-16 DIAGNOSIS — C8589 Other specified types of non-Hodgkin lymphoma, extranodal and solid organ sites: Secondary | ICD-10-CM

## 2010-08-21 ENCOUNTER — Ambulatory Visit (INDEPENDENT_AMBULATORY_CARE_PROVIDER_SITE_OTHER): Payer: Medicare Other | Admitting: *Deleted

## 2010-08-21 DIAGNOSIS — I4891 Unspecified atrial fibrillation: Secondary | ICD-10-CM

## 2010-08-21 DIAGNOSIS — Z7901 Long term (current) use of anticoagulants: Secondary | ICD-10-CM

## 2010-08-21 LAB — POCT INR: INR: 2.7

## 2010-09-02 ENCOUNTER — Other Ambulatory Visit (HOSPITAL_COMMUNITY): Payer: Self-pay | Admitting: Oncology

## 2010-09-02 ENCOUNTER — Ambulatory Visit (INDEPENDENT_AMBULATORY_CARE_PROVIDER_SITE_OTHER): Payer: Medicare Other | Admitting: *Deleted

## 2010-09-02 ENCOUNTER — Encounter (HOSPITAL_COMMUNITY): Payer: Medicare Other | Attending: Oncology

## 2010-09-02 DIAGNOSIS — Z79899 Other long term (current) drug therapy: Secondary | ICD-10-CM | POA: Insufficient documentation

## 2010-09-02 DIAGNOSIS — C8589 Other specified types of non-Hodgkin lymphoma, extranodal and solid organ sites: Secondary | ICD-10-CM

## 2010-09-02 DIAGNOSIS — Z7901 Long term (current) use of anticoagulants: Secondary | ICD-10-CM

## 2010-09-02 DIAGNOSIS — C8291 Follicular lymphoma, unspecified, lymph nodes of head, face, and neck: Secondary | ICD-10-CM | POA: Insufficient documentation

## 2010-09-02 DIAGNOSIS — I4891 Unspecified atrial fibrillation: Secondary | ICD-10-CM

## 2010-09-02 LAB — COMPREHENSIVE METABOLIC PANEL
Albumin: 3.5 g/dL (ref 3.5–5.2)
BUN: 13 mg/dL (ref 6–23)
Calcium: 9.6 mg/dL (ref 8.4–10.5)
GFR calc Af Amer: >60 mL/min (ref >60)
Glucose, Bld: 176 mg/dL — ABNORMAL HIGH (ref 70–99)
Total Protein: 6.7 g/dL (ref 6.0–8.3)

## 2010-09-02 LAB — CBC
MCH: 30.3 pg (ref 26.0–34.0)
MCHC: 34 g/dL (ref 30.0–36.0)
MCV: 89.1 fL (ref 78.0–100.0)
Platelets: 143 10*3/uL — ABNORMAL LOW (ref 150–400)
RBC: 4.32 MIL/uL (ref 4.22–5.81)

## 2010-09-02 LAB — DIFFERENTIAL
Basophils Relative: 2 % — ABNORMAL HIGH (ref 0–1)
Eosinophils Absolute: 0.4 10*3/uL (ref 0.0–0.7)
Eosinophils Relative: 9 % — ABNORMAL HIGH (ref 0–5)
Lymphs Abs: 0.5 10*3/uL — ABNORMAL LOW (ref 0.7–4.0)
Monocytes Relative: 8 % (ref 3–12)
Neutrophils Relative %: 71 % (ref 43–77)

## 2010-09-03 ENCOUNTER — Encounter (HOSPITAL_COMMUNITY): Payer: Medicare Other

## 2010-09-03 DIAGNOSIS — C969 Malignant neoplasm of lymphoid, hematopoietic and related tissue, unspecified: Secondary | ICD-10-CM

## 2010-09-03 DIAGNOSIS — Z5111 Encounter for antineoplastic chemotherapy: Secondary | ICD-10-CM

## 2010-09-04 ENCOUNTER — Encounter (HOSPITAL_COMMUNITY): Payer: Medicare Other

## 2010-09-04 DIAGNOSIS — Z5111 Encounter for antineoplastic chemotherapy: Secondary | ICD-10-CM

## 2010-09-04 DIAGNOSIS — C8589 Other specified types of non-Hodgkin lymphoma, extranodal and solid organ sites: Secondary | ICD-10-CM

## 2010-09-09 ENCOUNTER — Encounter (HOSPITAL_COMMUNITY): Payer: Medicare Other | Admitting: Oncology

## 2010-09-09 DIAGNOSIS — C8589 Other specified types of non-Hodgkin lymphoma, extranodal and solid organ sites: Secondary | ICD-10-CM

## 2010-09-11 ENCOUNTER — Ambulatory Visit (INDEPENDENT_AMBULATORY_CARE_PROVIDER_SITE_OTHER): Payer: Medicare Other | Admitting: *Deleted

## 2010-09-11 DIAGNOSIS — I4891 Unspecified atrial fibrillation: Secondary | ICD-10-CM

## 2010-09-11 DIAGNOSIS — Z7901 Long term (current) use of anticoagulants: Secondary | ICD-10-CM

## 2010-09-12 ENCOUNTER — Encounter (HOSPITAL_COMMUNITY): Payer: Self-pay

## 2010-09-12 ENCOUNTER — Encounter (HOSPITAL_COMMUNITY)
Admission: RE | Admit: 2010-09-12 | Discharge: 2010-09-12 | Disposition: A | Discharge status: Home / Self Care | Payer: Medicare Other | Source: Ambulatory Visit | Attending: Oncology | Admitting: Oncology

## 2010-09-12 DIAGNOSIS — C8290 Follicular lymphoma, unspecified, unspecified site: Secondary | ICD-10-CM

## 2010-09-12 DIAGNOSIS — C8299 Follicular lymphoma, unspecified, extranodal and solid organ sites: Secondary | ICD-10-CM | POA: Insufficient documentation

## 2010-09-12 MED ORDER — FLUDEOXYGLUCOSE F - 18 (FDG) INJECTION
16.8000 | Freq: Once | INTRAVENOUS | Status: AC | PRN
  Administered 2010-09-12: 16.8 via INTRAVENOUS

## 2010-09-23 ENCOUNTER — Other Ambulatory Visit (HOSPITAL_COMMUNITY): Payer: Self-pay | Admitting: Oncology

## 2010-09-23 ENCOUNTER — Encounter (HOSPITAL_COMMUNITY): Payer: Medicare Other | Attending: Oncology

## 2010-09-23 DIAGNOSIS — C969 Malignant neoplasm of lymphoid, hematopoietic and related tissue, unspecified: Secondary | ICD-10-CM

## 2010-09-23 DIAGNOSIS — Z5111 Encounter for antineoplastic chemotherapy: Secondary | ICD-10-CM

## 2010-09-23 DIAGNOSIS — C8299 Follicular lymphoma, unspecified, extranodal and solid organ sites: Secondary | ICD-10-CM | POA: Insufficient documentation

## 2010-09-23 LAB — COMPREHENSIVE METABOLIC PANEL
ALT: 12 U/L (ref 0–53)
Alkaline Phosphatase: 111 U/L (ref 39–117)
BUN: 19 mg/dL (ref 6–23)
CO2: 26 mEq/L (ref 19–32)
Chloride: 105 mEq/L (ref 96–112)
GFR calc Af Amer: >60 mL/min (ref >60)
Glucose, Bld: 105 mg/dL — ABNORMAL HIGH (ref 70–99)
Potassium: 4.1 mEq/L (ref 3.5–5.1)
Sodium: 139 mEq/L (ref 135–145)
Total Bilirubin: 0.3 mg/dL (ref 0.3–1.2)
Total Protein: 6.9 g/dL (ref 6.0–8.3)

## 2010-09-23 LAB — DIFFERENTIAL
Eosinophils Absolute: 0.3 10*3/uL (ref 0.0–0.7)
Lymphocytes Relative: 10 % — ABNORMAL LOW (ref 12–46)
Lymphs Abs: 0.5 10*3/uL — ABNORMAL LOW (ref 0.7–4.0)
Monocytes Relative: 10 % (ref 3–12)
Neutro Abs: 3.2 10*3/uL (ref 1.7–7.7)
Neutrophils Relative %: 70 % (ref 43–77)

## 2010-09-23 LAB — CBC
HCT: 33.2 % — ABNORMAL LOW (ref 39.0–52.0)
MCH: 30.4 pg (ref 26.0–34.0)
MCHC: 33.7 g/dL (ref 30.0–36.0)
MCV: 90.2 fL (ref 78.0–100.0)
RDW: 15.9 % — ABNORMAL HIGH (ref 11.5–15.5)

## 2010-09-24 ENCOUNTER — Inpatient Hospital Stay (HOSPITAL_COMMUNITY): Payer: Medicare Other

## 2010-09-24 ENCOUNTER — Encounter (HOSPITAL_COMMUNITY): Payer: Medicare Other

## 2010-09-24 DIAGNOSIS — Z5111 Encounter for antineoplastic chemotherapy: Secondary | ICD-10-CM

## 2010-09-24 DIAGNOSIS — R112 Nausea with vomiting, unspecified: Secondary | ICD-10-CM

## 2010-09-24 DIAGNOSIS — R066 Hiccough: Secondary | ICD-10-CM

## 2010-09-24 DIAGNOSIS — C8589 Other specified types of non-Hodgkin lymphoma, extranodal and solid organ sites: Secondary | ICD-10-CM

## 2010-09-26 ENCOUNTER — Encounter: Payer: Medicare Other | Admitting: *Deleted

## 2010-09-28 ENCOUNTER — Encounter (HOSPITAL_COMMUNITY): Payer: Self-pay | Admitting: *Deleted

## 2010-09-28 ENCOUNTER — Other Ambulatory Visit (HOSPITAL_COMMUNITY): Payer: Self-pay | Admitting: Oncology

## 2010-09-28 ENCOUNTER — Encounter (HOSPITAL_COMMUNITY): Payer: Self-pay | Admitting: Oncology

## 2010-09-28 DIAGNOSIS — C8290 Follicular lymphoma, unspecified, unspecified site: Secondary | ICD-10-CM

## 2010-09-28 HISTORY — DX: Follicular lymphoma, unspecified, unspecified site: C82.90

## 2010-09-30 ENCOUNTER — Ambulatory Visit (INDEPENDENT_AMBULATORY_CARE_PROVIDER_SITE_OTHER): Payer: Medicare Other | Admitting: *Deleted

## 2010-09-30 ENCOUNTER — Encounter (HOSPITAL_BASED_OUTPATIENT_CLINIC_OR_DEPARTMENT_OTHER): Payer: Medicare Other | Admitting: Oncology

## 2010-09-30 VITALS — BP 104/67 | Temp 97.2°F | Wt 225.0 lb

## 2010-09-30 DIAGNOSIS — I4891 Unspecified atrial fibrillation: Secondary | ICD-10-CM

## 2010-09-30 DIAGNOSIS — C8299 Follicular lymphoma, unspecified, extranodal and solid organ sites: Secondary | ICD-10-CM

## 2010-09-30 DIAGNOSIS — Z7901 Long term (current) use of anticoagulants: Secondary | ICD-10-CM

## 2010-09-30 DIAGNOSIS — C8290 Follicular lymphoma, unspecified, unspecified site: Secondary | ICD-10-CM

## 2010-09-30 LAB — POCT INR: INR: 3.4

## 2010-09-30 NOTE — Progress Notes (Signed)
This office note has been dictated.

## 2010-09-30 NOTE — Patient Instructions (Signed)
[facility]  Discharge Instructions  RECOMMENDATIONS MADE BY THE CONSULTANT AND ANY TEST RESULTS WILL BE SENT TO YOUR REFERRING DOCTOR.   EXAM FINDINGS BY MD TODAY AND SIGNS AND SYMPTOMS TO REPORT TO CLINIC OR PRIMARY MD: Pet scan reviewed-good results. We will do 2 more cycles then give you a break. Will do another PET after last cycle    MEDICATIONS PRESCRIBED: none Follow label directions  INSTRUCTIONS GIVEN AND DISCUSSED: Other   SPECIAL INSTRUCTIONS/FOLLOW-UP: Other (Referral/Appointments)   I acknowledge that I have been informed and understand all the instructions given to me and received a copy. I do not have any more questions at this time, but understand that I may call the Specialty Clinic at [facility] at [phone] during business hours should I have any further questions or need assistance in obtaining follow-up care.    __________________________________________  _____________  __________ Signature of Patient or Authorized Representative            Date                   Time    __________________________________________ Nurse's Signature

## 2010-10-08 ENCOUNTER — Other Ambulatory Visit (HOSPITAL_COMMUNITY): Payer: Medicare Other

## 2010-10-14 ENCOUNTER — Encounter: Payer: Medicare Other | Admitting: *Deleted

## 2010-10-14 ENCOUNTER — Encounter (HOSPITAL_BASED_OUTPATIENT_CLINIC_OR_DEPARTMENT_OTHER): Payer: Medicare Other

## 2010-10-14 ENCOUNTER — Telehealth (HOSPITAL_COMMUNITY): Payer: Self-pay

## 2010-10-14 VITALS — BP 109/69 | HR 93 | Temp 97.0°F | Ht 67.0 in | Wt 218.4 lb

## 2010-10-14 DIAGNOSIS — Z5111 Encounter for antineoplastic chemotherapy: Secondary | ICD-10-CM

## 2010-10-14 DIAGNOSIS — C8299 Follicular lymphoma, unspecified, extranodal and solid organ sites: Secondary | ICD-10-CM

## 2010-10-14 DIAGNOSIS — C8290 Follicular lymphoma, unspecified, unspecified site: Secondary | ICD-10-CM

## 2010-10-14 LAB — DIFFERENTIAL
Basophils Absolute: 0 10*3/uL (ref 0.0–0.1)
Eosinophils Relative: 4 % (ref 0–5)
Lymphocytes Relative: 23 % (ref 12–46)
Lymphs Abs: 0.8 10*3/uL (ref 0.7–4.0)
Monocytes Absolute: 0.4 10*3/uL (ref 0.1–1.0)
Neutro Abs: 2.1 10*3/uL (ref 1.7–7.7)

## 2010-10-14 LAB — COMPREHENSIVE METABOLIC PANEL
ALT: 11 U/L (ref 0–53)
AST: 15 U/L (ref 0–37)
Calcium: 8.9 mg/dL (ref 8.4–10.5)
Creatinine, Ser: 0.75 mg/dL (ref 0.50–1.35)
GFR calc Af Amer: >60 mL/min (ref >60)
Sodium: 141 mEq/L (ref 135–145)
Total Protein: 6.3 g/dL (ref 6.0–8.3)

## 2010-10-14 LAB — CBC
MCH: 31.6 pg (ref 26.0–34.0)
MCHC: 34.3 g/dL (ref 30.0–36.0)
MCV: 92.2 fL (ref 78.0–100.0)
Platelets: 159 10*3/uL (ref 150–400)

## 2010-10-14 LAB — LACTATE DEHYDROGENASE: LDH: 242 U/L (ref 94–250)

## 2010-10-14 MED ORDER — HEPARIN SOD (PORK) LOCK FLUSH 100 UNIT/ML IV SOLN
500.0000 [IU] | Freq: Once | INTRAVENOUS | Status: AC | PRN
  Administered 2010-10-14: 500 [IU]

## 2010-10-14 MED ORDER — ONDANSETRON 8 MG/50ML IVPB (CHCC): 24.0000 mg | Freq: Once | INTRAVENOUS | Status: DC

## 2010-10-14 MED ORDER — SODIUM CHLORIDE 0.9 % IV SOLN
Freq: Once | INTRAVENOUS | Status: AC
  Administered 2010-10-14: 10:00:00 via INTRAVENOUS

## 2010-10-14 MED ORDER — SODIUM CHLORIDE 0.9 % IJ SOLN
10.0000 mL | INTRAMUSCULAR | Status: DC | PRN
  Administered 2010-10-14: 10 mL

## 2010-10-14 MED ORDER — HEPARIN SOD (PORK) LOCK FLUSH 100 UNIT/ML IV SOLN
INTRAVENOUS | Status: AC
  Administered 2010-10-14: 500 [IU]
  Filled 2010-10-14: qty 5

## 2010-10-14 MED ORDER — SODIUM CHLORIDE 0.9 % IV SOLN
120.0000 mg/m2 | Freq: Once | INTRAVENOUS | Status: AC
  Administered 2010-10-14: 260 mg via INTRAVENOUS
  Filled 2010-10-14: qty 52

## 2010-10-14 MED ORDER — SODIUM CHLORIDE 0.9 % IV SOLN
375.0000 mg/m2 | Freq: Once | INTRAVENOUS | Status: AC
  Administered 2010-10-14: 800 mg via INTRAVENOUS
  Filled 2010-10-14: qty 80

## 2010-10-14 MED ORDER — SODIUM CHLORIDE 0.9 % IV SOLN
Freq: Once | INTRAVENOUS | Status: AC
  Administered 2010-10-14: 13:00:00 via INTRAVENOUS
  Filled 2010-10-14: qty 12

## 2010-10-14 MED ORDER — LORAZEPAM 2 MG/ML IJ SOLN
1.0000 mg | Freq: Once | INTRAMUSCULAR | Status: AC
  Administered 2010-10-14: 1 mg via INTRAVENOUS

## 2010-10-14 MED ORDER — LORAZEPAM 2 MG/ML IJ SOLN
INTRAMUSCULAR | Status: AC
  Filled 2010-10-14: qty 1

## 2010-10-14 MED ORDER — DEXAMETHASONE SODIUM PHOSPHATE 10 MG/ML IJ SOLN: 20.0000 mg | Freq: Once | INTRAMUSCULAR | Status: DC

## 2010-10-14 NOTE — Progress Notes (Signed)
Charles [name] presented for labwork. Labs per MD order drawn via Peripheral Line 23 gauge needle inserted in rt ac   Procedure without incident.   Patient tolerated procedure well.  1455 rituxan started at 40cc./hr x tolerated without side effects 1520 rituxan increased to 80cc/hr x 30 min tolerated without side effects 1555 rituxan increased to 120cc/hr x39min tolerated without side effects 1620 rituxan increased to 160cc/hr until completed @ 1800.  Tolerated infusion without side effects. 1915 IV converted to heparin lock d/c home with neighbor without any complaints of discomfort.  Tolerated Rx without any complaints

## 2010-10-15 ENCOUNTER — Encounter (HOSPITAL_BASED_OUTPATIENT_CLINIC_OR_DEPARTMENT_OTHER): Payer: Medicare Other

## 2010-10-15 VITALS — BP 107/67 | HR 91 | Temp 98.3°F | Wt 222.0 lb

## 2010-10-15 DIAGNOSIS — C8299 Follicular lymphoma, unspecified, extranodal and solid organ sites: Secondary | ICD-10-CM

## 2010-10-15 DIAGNOSIS — C8290 Follicular lymphoma, unspecified, unspecified site: Secondary | ICD-10-CM

## 2010-10-15 DIAGNOSIS — Z5111 Encounter for antineoplastic chemotherapy: Secondary | ICD-10-CM

## 2010-10-15 MED ORDER — ONDANSETRON 8 MG/50ML IVPB (CHCC): 24.0000 mg | Freq: Once | INTRAVENOUS | Status: DC

## 2010-10-15 MED ORDER — HEPARIN SOD (PORK) LOCK FLUSH 100 UNIT/ML IV SOLN
INTRAVENOUS | Status: AC
  Administered 2010-10-15: 500 [IU]
  Filled 2010-10-15: qty 5

## 2010-10-15 MED ORDER — SODIUM CHLORIDE 0.9 % IV SOLN
120.0000 mg/m2 | Freq: Once | INTRAVENOUS | Status: AC
  Administered 2010-10-15: 260 mg via INTRAVENOUS
  Filled 2010-10-15: qty 52

## 2010-10-15 MED ORDER — SODIUM CHLORIDE 0.9 % IV SOLN
Freq: Once | INTRAVENOUS | Status: AC
  Administered 2010-10-15: 24 mg via INTRAVENOUS
  Filled 2010-10-15: qty 12

## 2010-10-15 MED ORDER — LORAZEPAM 2 MG/ML IJ SOLN
1.0000 mg | Freq: Once | INTRAMUSCULAR | Status: AC
  Administered 2010-10-15: 1 mg via INTRAVENOUS

## 2010-10-15 MED ORDER — DEXAMETHASONE SODIUM PHOSPHATE 10 MG/ML IJ SOLN: 20.0000 mg | Freq: Once | INTRAMUSCULAR | Status: DC

## 2010-10-15 MED ORDER — SODIUM CHLORIDE 0.9 % IJ SOLN
INTRAMUSCULAR | Status: AC
  Administered 2010-10-15: 10 mL
  Filled 2010-10-15: qty 10

## 2010-10-15 MED ORDER — SODIUM CHLORIDE 0.9 % IV SOLN
Freq: Once | INTRAVENOUS | Status: AC
  Administered 2010-10-15: 10:00:00 via INTRAVENOUS

## 2010-10-15 MED ORDER — HEPARIN SOD (PORK) LOCK FLUSH 100 UNIT/ML IV SOLN
500.0000 [IU] | Freq: Once | INTRAVENOUS | Status: AC | PRN
  Administered 2010-10-15: 500 [IU]

## 2010-10-15 MED ORDER — SODIUM CHLORIDE 0.9 % IJ SOLN
10.0000 mL | INTRAMUSCULAR | Status: DC | PRN
  Administered 2010-10-15 (×2): 10 mL

## 2010-10-15 MED ORDER — LORAZEPAM 2 MG/ML IJ SOLN
INTRAMUSCULAR | Status: AC
  Administered 2010-10-15: 1 mg via INTRAVENOUS
  Filled 2010-10-15: qty 1

## 2010-10-15 NOTE — Progress Notes (Deleted)
[facility] Discharge Instructions for Patients Receiving Chemotherapy  Today you received the following chemotherapy agents bendamustine  To help prevent nausea and vomiting after your treatment, we encourage you to take your nausea medication Ativan 1mg  every 3 to 4 hours as needed for nausea or vomiting Begin taking it at when needed and take it as often as prescribed for the next 48 hours.   If you develop nausea and vomiting that is not controlled by your nausea medication, call the clinic. If it is after clinic hours your family physician or the after hours number for the clinic or go to the Emergency Department.   BELOW ARE SYMPTOMS THAT SHOULD BE REPORTED IMMEDIATELY:  *FEVER GREATER THAN 101.0 F  *CHILLS WITH OR WITHOUT FEVER  NAUSEA AND VOMITING THAT IS NOT CONTROLLED WITH YOUR NAUSEA MEDICATION  *UNUSUAL SHORTNESS OF BREATH  *UNUSUAL BRUISING OR BLEEDING  TENDERNESS IN MOUTH AND THROAT WITH OR WITHOUT PRESENCE OF ULCERS  *URINARY PROBLEMS  *BOWEL PROBLEMS  UNUSUAL RASH Items with * indicate a potential emergency and should be followed up as soon as possible.  One of the nurses will contact you 24 hours after your treatment. Please let the nurse know about any problems that you may have experienced. Feel free to call the clinic you have any questions or concerns. The clinic phone number is [phone].   I have been informed and understand all the instructions given to me. I know to contact the clinic, my physician, or go to the Emergency Department if any problems should occur. I do not have any questions at this time, but understand that I may call the clinic during office hours or the Patient Navigator at [phone] should I have any questions or need assistance in obtaining follow up care.    __________________________________________  _____________  __________ Signature of Patient or Authorized Representative            Date                    Time    __________________________________________ Nurse's Signature

## 2010-10-15 NOTE — Patient Instructions (Signed)
[facility] Discharge Instructions for Patients Receiving Chemotherapy  Today you received the following chemotherapy agents bendamustine  To help prevent nausea and vomiting after your treatment, we encourage you to take your nausea medication Ativan 1mg  every 3 to 4 hours as needed for nausea or vomiting Begin taking it at as needed and take it as often as prescribed for the next 48 hours.   If you develop nausea and vomiting that is not controlled by your nausea medication, call the clinic. If it is after clinic hours your family physician or the after hours number for the clinic or go to the Emergency Department.   BELOW ARE SYMPTOMS THAT SHOULD BE REPORTED IMMEDIATELY:  *FEVER GREATER THAN 101.0 F  *CHILLS WITH OR WITHOUT FEVER  NAUSEA AND VOMITING THAT IS NOT CONTROLLED WITH YOUR NAUSEA MEDICATION  *UNUSUAL SHORTNESS OF BREATH  *UNUSUAL BRUISING OR BLEEDING  TENDERNESS IN MOUTH AND THROAT WITH OR WITHOUT PRESENCE OF ULCERS  *URINARY PROBLEMS  *BOWEL PROBLEMS  UNUSUAL RASH Items with * indicate a potential emergency and should be followed up as soon as possible.  One of the nurses will contact you 24 hours after your treatment. Please let the nurse know about any problems that you may have experienced. Feel free to call the clinic you have any questions or concerns. The clinic phone number is [phone].   I have been informed and understand all the instructions given to me. I know to contact the clinic, my physician, or go to the Emergency Department if any problems should occur. I do not have any questions at this time, but understand that I may call the clinic during office hours or the Patient Navigator at [phone] should I have any questions or need assistance in obtaining follow up care.    __________________________________________  _____________  __________ Signature of Patient or Authorized Representative            Date                    Time    __________________________________________ Nurse's Signature

## 2010-10-16 ENCOUNTER — Ambulatory Visit (INDEPENDENT_AMBULATORY_CARE_PROVIDER_SITE_OTHER): Payer: Medicare Other | Admitting: *Deleted

## 2010-10-16 DIAGNOSIS — I4891 Unspecified atrial fibrillation: Secondary | ICD-10-CM

## 2010-10-16 DIAGNOSIS — Z7901 Long term (current) use of anticoagulants: Secondary | ICD-10-CM

## 2010-10-16 NOTE — Telephone Encounter (Signed)
ENCOUNTER IS AN ERROR

## 2010-10-28 ENCOUNTER — Encounter (HOSPITAL_COMMUNITY): Payer: Medicare Other | Attending: Oncology | Admitting: Oncology

## 2010-10-28 VITALS — BP 125/81 | HR 99 | Temp 98.3°F | Wt 216.0 lb

## 2010-10-28 DIAGNOSIS — G4700 Insomnia, unspecified: Secondary | ICD-10-CM

## 2010-10-28 DIAGNOSIS — C8299 Follicular lymphoma, unspecified, extranodal and solid organ sites: Secondary | ICD-10-CM | POA: Insufficient documentation

## 2010-10-28 DIAGNOSIS — C8290 Follicular lymphoma, unspecified, unspecified site: Secondary | ICD-10-CM

## 2010-10-28 MED ORDER — TEMAZEPAM 15 MG PO CAPS: 15.0000 mg | ORAL_CAPSULE | Freq: Every evening | ORAL | Status: AC | PRN

## 2010-10-28 NOTE — Patient Instructions (Signed)
[facility]  Discharge Instructions  RECOMMENDATIONS MADE BY THE CONSULTANT AND ANY TEST RESULTS WILL BE SENT TO YOUR REFERRING DOCTOR.   EXAM FINDINGS BY MD TODAY AND SIGNS AND SYMPTOMS TO REPORT TO CLINIC OR PRIMARY MD: Elijah Birk says you are looking good.  MEDICATIONS PRESCRIBED: Restoril 15mg  at bedtime and may increase to 30mg  at bedtime if needed. Do not take the Ambien with the Restoril. You may continue to take the Xanax at bedtime with the Restoril.    INSTRUCTIONS GIVEN AND DISCUSSED: You will need a PET SCAN 2 weeks following your last chemo treatment. Then Dr. Mariel Sleet will see you after the PET scan to review the results.    I acknowledge that I have been informed and understand all the instructions given to me and received a copy. I do not have any more questions at this time, but understand that I may call the Specialty Clinic at [facility] at [phone] during business hours should I have any further questions or need assistance in obtaining follow-up care.    __________________________________________  _____________  __________ Signature of Patient or Authorized Representative            Date                   Time    __________________________________________ Nurse's Signature

## 2010-10-28 NOTE — Progress Notes (Signed)
Charles Obey, MD [address] [place] [address]  1. Follicular lymphoma  CBC, Differential, Comprehensive metabolic panel, Lactate dehydrogenase, NM PET Image Initial (PI) Skull Base To Thigh    CURRENT THERAPY: S/P 5 cycles of Bendamustine and Rituxan.  He is due for cycle 6 next week.  This therapy began on 07/17/10.  INTERVAL HISTORY: Charles [name] 70 y.o. 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  He denies any complaints today.  He reports that he was having B/L knee pain.  As a result, he went to Dr. Sanjuan Dame office and received B/L cortisone knee injections.  He reports that his knees feel much better.  He denies any fevers, chills night sweats.    He biggest problem is sleeping.  He utilizes a Xanax to help calm his mind, and then utilizes 10 mg of Ambien.  He reports that he falls asleep but then is awake at 1:30 am.  He would like to try another medication to help him sleep.  As a result, I have asked him to hold the Ambien and we will try Resoril.    The patient asks about "sex".  He denies having any erections that he is aware of.  He denies any morning erections.  He asks about Viagra which I told him he may take.  He reports that he does have some at home.  He will take that.  I spent some time with the patient going over patient education regarding this.  I explained that there is no guarantee that this medication will solve the issue.  I explained there may be some stress and anxiety associated with this issue.  I hope that this resolves at the completion of chemotherapy.  Past Medical History  Diagnosis Date  . Adenopathy, cervical   . Cancer   . Follicular lymphoma 09/28/2010    has CARCINOMA, BASAL CELL; THYROID STIMULATING HORMONE, ABNORMAL; TOBACCO ABUSE; INSOMNIA, CHRONIC; HYPERTENSION; ALLERGIC RHINITIS; GASTRIC ULCER, HX OF; Atrial fibrillation; Encounter for long-term  (current) use of anticoagulants; and Follicular lymphoma on his problem list.      has no known allergies.  [name] had no medications administered during this visit.  No past surgical history on file.  Denies any headaches, dizziness, double vision, fevers, chills, night sweats, nausea, vomiting, diarrhea, constipation, chest pain, heart palpitations, shortness of breath, blood in stool, black tarry stool, urinary pain, urinary burning, urinary frequency, hematuria.   PHYSICAL EXAMINATION  ECOG PERFORMANCE STATUS: 1 - Symptomatic but completely ambulatory  Filed Vitals:   10/28/10 0930  BP: 125/81  Pulse: 99  Temp: 98.3 F (36.8 C)    GENERAL:no distress, well nourished, well developed, comfortable, cooperative and smiling SKIN: skin color, texture, turgor are normal, no rashes or significant lesions HEAD: Normocephalic, No masses, lesions, tenderness or abnormalities EYES: normal, PERRLA, EOMI EARS: External ears normal OROPHARYNX:mucous membranes are moist  NECK: supple, no adenopathy, no bruits, no JVD, thyroid normal size, non-tender, without nodularity, no stridor, non-tender, trachea midline LYMPH:  no palpable lymphadenopathy, no hepatosplenomegaly BREAST:not examined LUNGS: clear to auscultation and percussion HEART: no murmurs, no gallops, irregularly irregular, S1 normal and S2 normal ABDOMEN:abdomen soft, non-tender, obese and normal bowel sounds BACK: Back symmetric, no curvature. EXTREMITIES:less then 2 second capillary refill, no joint deformities, effusion, or inflammation, no skin discoloration, no clubbing, no cyanosis, positive findings:  edema trace LE edema B/L L>R.  NEURO:  alert & oriented x 3 with fluent speech, no focal motor/sensory deficits, gait normal    LABORATORY DATA: Lab Results  Component Value Date   WBC 3.5* 10/14/2010   HGB 10.9* 10/14/2010   HCT 31.8* 10/14/2010   MCV 92.2 10/14/2010   PLT 159 10/14/2010     Chemistry        Component Value Date/Time   NA 141 10/14/2010 1008   K 3.9 10/14/2010 1008   CL 109 10/14/2010 1008   CO2 23 10/14/2010 1008   BUN 12 10/14/2010 1008   CREATININE 0.75 10/14/2010 1008      Component Value Date/Time   CALCIUM 8.9 10/14/2010 1008   ALKPHOS 93 10/14/2010 1008   AST 15 10/14/2010 1008   ALT 11 10/14/2010 1008   BILITOT 0.3 10/14/2010 1008      ASSESSMENT:  1. Insomnia 2. Follicular lymphoma, S/P 5 cycles. Stage IVa 3. B/L knee discomfort, resolved, s/p cortisone injections by Dr. Hilda Lias.   PLAN:  1. Rx for Restoril 15 mg given #45.  He will take one capsule tonight.  If this does not work for him, he may take 2 capsules tomorrow night.  He may continue taking his Xanax for anxiety.  He will place the Ambien on hold. 2. Continue with chemotherapy as anticipated next week for cycle 6. 3. PET scan 10-14 days following cycle 6 of chemotherapy 4. Return 2-3 days following PET scan for follow-up. 5. I spent some time with the patient going over patient education regarding erectile dysfunction.   6. He make take Viagra PRN.   All questions were answered. The patient knows to call the clinic with any problems, questions or concerns. We can certainly see the patient much sooner if necessary.  I spent 25 minutes counseling the patient face to face. The total time spent in the appointment was 40 minutes.  Charles [name]

## 2010-11-01 ENCOUNTER — Encounter (HOSPITAL_BASED_OUTPATIENT_CLINIC_OR_DEPARTMENT_OTHER): Payer: Medicare Other

## 2010-11-01 DIAGNOSIS — C8299 Follicular lymphoma, unspecified, extranodal and solid organ sites: Secondary | ICD-10-CM

## 2010-11-01 DIAGNOSIS — C8290 Follicular lymphoma, unspecified, unspecified site: Secondary | ICD-10-CM

## 2010-11-01 LAB — CBC
HCT: 33.9 % — ABNORMAL LOW (ref 39.0–52.0)
Hemoglobin: 11.5 g/dL — ABNORMAL LOW (ref 13.0–17.0)
MCH: 32.2 pg (ref 26.0–34.0)
MCHC: 33.9 g/dL (ref 30.0–36.0)
RDW: 16.8 % — ABNORMAL HIGH (ref 11.5–15.5)

## 2010-11-01 LAB — DIFFERENTIAL
Lymphocytes Relative: 24 % (ref 12–46)
Lymphs Abs: 1 10*3/uL (ref 0.7–4.0)
Neutro Abs: 2.5 10*3/uL (ref 1.7–7.7)
Neutrophils Relative %: 58 % (ref 43–77)

## 2010-11-01 LAB — LACTATE DEHYDROGENASE: LDH: 209 U/L (ref 94–250)

## 2010-11-01 LAB — COMPREHENSIVE METABOLIC PANEL
BUN: 19 mg/dL (ref 6–23)
Calcium: 9.6 mg/dL (ref 8.4–10.5)
GFR calc Af Amer: >60 mL/min (ref >60)
Glucose, Bld: 96 mg/dL (ref 70–99)
Total Protein: 6.7 g/dL (ref 6.0–8.3)

## 2010-11-01 NOTE — Progress Notes (Signed)
Labs drawn today for cbc/diff,cmp,ldh 

## 2010-11-04 ENCOUNTER — Other Ambulatory Visit: Payer: Self-pay | Admitting: Cardiology

## 2010-11-04 ENCOUNTER — Encounter (HOSPITAL_BASED_OUTPATIENT_CLINIC_OR_DEPARTMENT_OTHER): Payer: Medicare Other

## 2010-11-04 VITALS — BP 116/78 | HR 74 | Temp 97.6°F | Wt 211.4 lb

## 2010-11-04 DIAGNOSIS — C8290 Follicular lymphoma, unspecified, unspecified site: Secondary | ICD-10-CM

## 2010-11-04 DIAGNOSIS — Z5111 Encounter for antineoplastic chemotherapy: Secondary | ICD-10-CM

## 2010-11-04 DIAGNOSIS — Z5112 Encounter for antineoplastic immunotherapy: Secondary | ICD-10-CM

## 2010-11-04 DIAGNOSIS — C8299 Follicular lymphoma, unspecified, extranodal and solid organ sites: Secondary | ICD-10-CM

## 2010-11-04 MED ORDER — EPINEPHRINE HCL 0.1 MG/ML IJ SOLN: 0.2500 mg | Freq: Once | INTRAMUSCULAR | Status: DC | PRN

## 2010-11-04 MED ORDER — SODIUM CHLORIDE 0.9 % IJ SOLN: 10.0000 mL | INTRAMUSCULAR | Status: DC | PRN

## 2010-11-04 MED ORDER — METHYLPREDNISOLONE SODIUM SUCC 125 MG IJ SOLR: 125.0000 mg | Freq: Once | INTRAMUSCULAR | Status: DC | PRN

## 2010-11-04 MED ORDER — SODIUM CHLORIDE 0.9 % IV SOLN
Freq: Once | INTRAVENOUS | Status: AC
  Administered 2010-11-04: 09:00:00 via INTRAVENOUS
  Filled 2010-11-04: qty 12

## 2010-11-04 MED ORDER — DIPHENHYDRAMINE HCL 50 MG/ML IJ SOLN: 25.0000 mg | Freq: Once | INTRAMUSCULAR | Status: DC | PRN

## 2010-11-04 MED ORDER — SODIUM CHLORIDE 0.9 % IV SOLN
120.0000 mg/m2 | Freq: Once | INTRAVENOUS | Status: AC
  Administered 2010-11-04: 260 mg via INTRAVENOUS
  Filled 2010-11-04: qty 52

## 2010-11-04 MED ORDER — ONDANSETRON 8 MG/50ML IVPB (CHCC): 24.0000 mg | Freq: Once | INTRAVENOUS | Status: DC

## 2010-11-04 MED ORDER — SODIUM CHLORIDE 0.9 % IV SOLN: Freq: Once | INTRAVENOUS | Status: DC | PRN

## 2010-11-04 MED ORDER — DIPHENHYDRAMINE HCL 50 MG/ML IJ SOLN: 50.0000 mg | Freq: Once | INTRAMUSCULAR | Status: DC | PRN

## 2010-11-04 MED ORDER — HEPARIN SOD (PORK) LOCK FLUSH 100 UNIT/ML IV SOLN
500.0000 [IU] | Freq: Once | INTRAVENOUS | Status: AC | PRN
  Administered 2010-11-04: 500 [IU]

## 2010-11-04 MED ORDER — SODIUM CHLORIDE 0.9 % IV SOLN
Freq: Once | INTRAVENOUS | Status: AC
  Administered 2010-11-04: 09:00:00 via INTRAVENOUS

## 2010-11-04 MED ORDER — SODIUM CHLORIDE 0.9 % IJ SOLN: 3.0000 mL | INTRAMUSCULAR | Status: DC | PRN

## 2010-11-04 MED ORDER — ALTEPLASE 2 MG IJ SOLR: 2.0000 mg | Freq: Once | INTRAMUSCULAR | Status: DC | PRN

## 2010-11-04 MED ORDER — SODIUM CHLORIDE 0.9 % IV SOLN
375.0000 mg/m2 | Freq: Once | INTRAVENOUS | Status: AC
  Administered 2010-11-04: 800 mg via INTRAVENOUS
  Filled 2010-11-04: qty 80

## 2010-11-04 MED ORDER — LORAZEPAM 2 MG/ML IJ SOLN
INTRAMUSCULAR | Status: AC
  Filled 2010-11-04: qty 1

## 2010-11-04 MED ORDER — HEPARIN SOD (PORK) LOCK FLUSH 100 UNIT/ML IV SOLN: 250.0000 [IU] | Freq: Once | INTRAVENOUS | Status: DC | PRN

## 2010-11-04 MED ORDER — LORAZEPAM 2 MG/ML IJ SOLN
1.0000 mg | Freq: Once | INTRAMUSCULAR | Status: AC
  Administered 2010-11-04: 1 mg via INTRAVENOUS

## 2010-11-04 MED ORDER — DEXAMETHASONE SODIUM PHOSPHATE 10 MG/ML IJ SOLN: 20.0000 mg | Freq: Once | INTRAMUSCULAR | Status: DC

## 2010-11-05 ENCOUNTER — Encounter (HOSPITAL_BASED_OUTPATIENT_CLINIC_OR_DEPARTMENT_OTHER): Payer: Medicare Other

## 2010-11-05 VITALS — BP 112/69 | HR 81 | Temp 97.7°F | Wt 216.2 lb

## 2010-11-05 DIAGNOSIS — C8290 Follicular lymphoma, unspecified, unspecified site: Secondary | ICD-10-CM

## 2010-11-05 DIAGNOSIS — C8299 Follicular lymphoma, unspecified, extranodal and solid organ sites: Secondary | ICD-10-CM

## 2010-11-05 DIAGNOSIS — Z5111 Encounter for antineoplastic chemotherapy: Secondary | ICD-10-CM

## 2010-11-05 MED ORDER — LORAZEPAM 2 MG/ML IJ SOLN
1.0000 mg | Freq: Once | INTRAMUSCULAR | Status: AC
  Administered 2010-11-05: 1 mg via INTRAVENOUS

## 2010-11-05 MED ORDER — BENDAMUSTINE HCL (LYOPHILIZED PWD) CHEMO INJECTION 100MG
120.0000 mg/m2 | Freq: Once | INTRAVENOUS | Status: AC
  Administered 2010-11-05: 260 mg via INTRAVENOUS
  Filled 2010-11-05: qty 52

## 2010-11-05 MED ORDER — HEPARIN SOD (PORK) LOCK FLUSH 100 UNIT/ML IV SOLN
INTRAVENOUS | Status: AC
  Filled 2010-11-05: qty 5

## 2010-11-05 MED ORDER — LORAZEPAM 2 MG/ML IJ SOLN
INTRAMUSCULAR | Status: AC
  Filled 2010-11-05: qty 1

## 2010-11-05 MED ORDER — DEXAMETHASONE SODIUM PHOSPHATE 10 MG/ML IJ SOLN: 20.0000 mg | Freq: Once | INTRAMUSCULAR | Status: DC

## 2010-11-05 MED ORDER — SODIUM CHLORIDE 0.9 % IJ SOLN: 3.0000 mL | INTRAMUSCULAR | Status: DC | PRN

## 2010-11-05 MED ORDER — SODIUM CHLORIDE 0.9 % IV SOLN
Freq: Once | INTRAVENOUS | Status: AC
  Administered 2010-11-05: 09:00:00 via INTRAVENOUS

## 2010-11-05 MED ORDER — SODIUM CHLORIDE 0.9 % IJ SOLN: 10.0000 mL | INTRAMUSCULAR | Status: DC | PRN

## 2010-11-05 MED ORDER — SODIUM CHLORIDE 0.9 % IV SOLN
Freq: Once | INTRAVENOUS | Status: AC
  Administered 2010-11-05: 10:00:00 via INTRAVENOUS
  Filled 2010-11-05: qty 2

## 2010-11-05 MED ORDER — HEPARIN SOD (PORK) LOCK FLUSH 100 UNIT/ML IV SOLN: 250.0000 [IU] | Freq: Once | INTRAVENOUS | Status: DC | PRN

## 2010-11-05 MED ORDER — ONDANSETRON 8 MG/50ML IVPB (CHCC): 24.0000 mg | Freq: Once | INTRAVENOUS | Status: DC

## 2010-11-05 MED ORDER — HEPARIN SOD (PORK) LOCK FLUSH 100 UNIT/ML IV SOLN
500.0000 [IU] | Freq: Once | INTRAVENOUS | Status: AC | PRN
  Administered 2010-11-05: 500 [IU]

## 2010-11-05 MED ORDER — ALTEPLASE 2 MG IJ SOLR: 2.0000 mg | Freq: Once | INTRAMUSCULAR | Status: DC | PRN

## 2010-11-06 ENCOUNTER — Ambulatory Visit (INDEPENDENT_AMBULATORY_CARE_PROVIDER_SITE_OTHER): Payer: Medicare Other | Admitting: *Deleted

## 2010-11-06 ENCOUNTER — Telehealth (HOSPITAL_COMMUNITY): Payer: Self-pay

## 2010-11-06 DIAGNOSIS — Z7901 Long term (current) use of anticoagulants: Secondary | ICD-10-CM

## 2010-11-06 DIAGNOSIS — I4891 Unspecified atrial fibrillation: Secondary | ICD-10-CM

## 2010-11-06 LAB — POCT INR: INR: 2.5

## 2010-11-06 NOTE — Telephone Encounter (Signed)
Did great after chemo. Slept a lot.

## 2010-11-13 ENCOUNTER — Other Ambulatory Visit (HOSPITAL_COMMUNITY): Payer: Medicare Other

## 2010-11-19 ENCOUNTER — Encounter (HOSPITAL_COMMUNITY)
Admission: RE | Admit: 2010-11-19 | Discharge: 2010-11-19 | Disposition: A | Discharge status: Home / Self Care | Payer: Medicare Other | Source: Ambulatory Visit | Attending: Oncology | Admitting: Oncology

## 2010-11-19 DIAGNOSIS — N281 Cyst of kidney, acquired: Secondary | ICD-10-CM | POA: Insufficient documentation

## 2010-11-19 DIAGNOSIS — Z79899 Other long term (current) drug therapy: Secondary | ICD-10-CM | POA: Insufficient documentation

## 2010-11-19 DIAGNOSIS — I517 Cardiomegaly: Secondary | ICD-10-CM | POA: Insufficient documentation

## 2010-11-19 DIAGNOSIS — C8299 Follicular lymphoma, unspecified, extranodal and solid organ sites: Secondary | ICD-10-CM | POA: Insufficient documentation

## 2010-11-19 DIAGNOSIS — I2510 Atherosclerotic heart disease of native coronary artery without angina pectoris: Secondary | ICD-10-CM | POA: Insufficient documentation

## 2010-11-19 DIAGNOSIS — K4020 Bilateral inguinal hernia, without obstruction or gangrene, not specified as recurrent: Secondary | ICD-10-CM | POA: Insufficient documentation

## 2010-11-19 DIAGNOSIS — C8290 Follicular lymphoma, unspecified, unspecified site: Secondary | ICD-10-CM

## 2010-11-19 MED ORDER — FLUDEOXYGLUCOSE F - 18 (FDG) INJECTION
16.4000 | Freq: Once | INTRAVENOUS | Status: AC | PRN
  Administered 2010-11-19: 16.4 via INTRAVENOUS

## 2010-11-20 ENCOUNTER — Telehealth (HOSPITAL_COMMUNITY): Payer: Self-pay | Admitting: *Deleted

## 2010-11-20 NOTE — Telephone Encounter (Signed)
Message copied by Dennie Maizes on Wed Nov 20, 2010  4:30 PM ------      Message from: Mariel Sleet, ERIC S      Created: Wed Nov 20, 2010  1:15 PM       Call him-PET negative!

## 2010-11-20 NOTE — Telephone Encounter (Signed)
Spoke with Annia Belt pleased with PET results.

## 2010-11-21 ENCOUNTER — Telehealth: Payer: Self-pay | Admitting: Cardiology

## 2010-11-21 NOTE — Telephone Encounter (Signed)
LINSINOPRIL NEEDS CALLED/TMJ

## 2010-11-22 ENCOUNTER — Other Ambulatory Visit: Payer: Self-pay

## 2010-11-22 MED ORDER — LISINOPRIL 10 MG PO TABS: 10.0000 mg | ORAL_TABLET | Freq: Every day | ORAL | Status: DC

## 2010-11-26 ENCOUNTER — Ambulatory Visit (HOSPITAL_COMMUNITY): Payer: Medicare Other | Admitting: Oncology

## 2010-11-27 ENCOUNTER — Ambulatory Visit (INDEPENDENT_AMBULATORY_CARE_PROVIDER_SITE_OTHER): Payer: Medicare Other | Admitting: *Deleted

## 2010-11-27 DIAGNOSIS — I4891 Unspecified atrial fibrillation: Secondary | ICD-10-CM

## 2010-11-27 DIAGNOSIS — Z7901 Long term (current) use of anticoagulants: Secondary | ICD-10-CM

## 2010-12-11 ENCOUNTER — Encounter (HOSPITAL_COMMUNITY): Payer: Self-pay | Admitting: Oncology

## 2010-12-11 ENCOUNTER — Encounter (HOSPITAL_COMMUNITY): Payer: Medicare Other | Attending: Oncology | Admitting: Oncology

## 2010-12-11 VITALS — BP 99/66 | HR 83 | Temp 97.4°F | Ht 67.0 in | Wt 213.6 lb

## 2010-12-11 DIAGNOSIS — C8291 Follicular lymphoma, unspecified, lymph nodes of head, face, and neck: Secondary | ICD-10-CM

## 2010-12-11 DIAGNOSIS — C8299 Follicular lymphoma, unspecified, extranodal and solid organ sites: Secondary | ICD-10-CM | POA: Insufficient documentation

## 2010-12-11 DIAGNOSIS — C8290 Follicular lymphoma, unspecified, unspecified site: Secondary | ICD-10-CM

## 2010-12-11 NOTE — Patient Instructions (Signed)
[facility]  Discharge Instructions  RECOMMENDATIONS MADE BY THE CONSULTANT AND ANY TEST RESULTS WILL BE SENT TO YOUR REFERRING DOCTOR.   EXAM FINDINGS BY MD TODAY AND SIGNS AND SYMPTOMS TO REPORT TO CLINIC OR PRIMARY MD:  Doing well  MEDICATIONS PRESCRIBED: Stop allopurinol   INSTRUCTIONS GIVEN AND DISCUSSED: Other  We will do rituxan in November.  SPECIAL INSTRUCTIONS/FOLLOW-UP: Other (Referral/Appointments)  Appt with Dr Kathrynn Running in next 2 weeks   I acknowledge that I have been informed and understand all the instructions given to me and received a copy. I do not have any more questions at this time, but understand that I may call the Specialty Clinic at [facility] at [phone] during business hours should I have any further questions or need assistance in obtaining follow-up care.    __________________________________________  _____________  __________ Signature of Patient or Authorized Representative            Date                   Time    __________________________________________ Nurse's Signature

## 2010-12-11 NOTE — Progress Notes (Signed)
This office note has been dictated.

## 2010-12-16 ENCOUNTER — Ambulatory Visit (INDEPENDENT_AMBULATORY_CARE_PROVIDER_SITE_OTHER): Payer: Medicare Other | Admitting: *Deleted

## 2010-12-16 DIAGNOSIS — I4891 Unspecified atrial fibrillation: Secondary | ICD-10-CM

## 2010-12-16 DIAGNOSIS — Z7901 Long term (current) use of anticoagulants: Secondary | ICD-10-CM

## 2010-12-20 ENCOUNTER — Other Ambulatory Visit (HOSPITAL_COMMUNITY): Payer: Self-pay | Admitting: Oncology

## 2010-12-20 NOTE — Progress Notes (Signed)
This office note has been dictated.

## 2010-12-23 ENCOUNTER — Encounter: Payer: Self-pay | Admitting: Cardiology

## 2010-12-24 LAB — CBC
HCT: 29.5 — ABNORMAL LOW
HCT: 32.4 — ABNORMAL LOW
MCHC: 34.2
MCV: 91
Platelets: 270
Platelets: 209
Platelets: 242
RDW: 13.2
RDW: 13.3
RDW: 13.4

## 2010-12-24 LAB — BASIC METABOLIC PANEL
BUN: 8
Calcium: 9.3
Chloride: 104
Creatinine, Ser: 0.76
GFR calc Af Amer: >60
GFR calc non Af Amer: >60
Glucose, Bld: 130 — ABNORMAL HIGH
Potassium: 3.4 — ABNORMAL LOW
Sodium: 139

## 2010-12-26 ENCOUNTER — Ambulatory Visit (INDEPENDENT_AMBULATORY_CARE_PROVIDER_SITE_OTHER): Payer: Medicare Other | Admitting: Cardiology

## 2010-12-26 ENCOUNTER — Encounter: Payer: Self-pay | Admitting: Cardiology

## 2010-12-26 ENCOUNTER — Other Ambulatory Visit: Payer: Self-pay

## 2010-12-26 VITALS — BP 90/58 | HR 82 | Ht 67.0 in | Wt 218.0 lb

## 2010-12-26 DIAGNOSIS — I10 Essential (primary) hypertension: Secondary | ICD-10-CM

## 2010-12-26 DIAGNOSIS — C8290 Follicular lymphoma, unspecified, unspecified site: Secondary | ICD-10-CM

## 2010-12-26 DIAGNOSIS — E782 Mixed hyperlipidemia: Secondary | ICD-10-CM

## 2010-12-26 DIAGNOSIS — Z7901 Long term (current) use of anticoagulants: Secondary | ICD-10-CM

## 2010-12-26 DIAGNOSIS — I4891 Unspecified atrial fibrillation: Secondary | ICD-10-CM

## 2010-12-26 DIAGNOSIS — F17200 Nicotine dependence, unspecified, uncomplicated: Secondary | ICD-10-CM

## 2010-12-26 DIAGNOSIS — C8299 Follicular lymphoma, unspecified, extranodal and solid organ sites: Secondary | ICD-10-CM

## 2010-12-26 MED ORDER — DILTIAZEM HCL ER COATED BEADS 240 MG PO CP24: 240.0000 mg | ORAL_CAPSULE | Freq: Every day | ORAL | Status: DC

## 2010-12-26 MED ORDER — PRAVASTATIN SODIUM 80 MG PO TABS: 80.0000 mg | ORAL_TABLET | Freq: Every evening | ORAL | Status: DC

## 2010-12-26 NOTE — Assessment & Plan Note (Signed)
Blood pressure is on the low side, although not causing symptoms.  We will cut back on patient's use of diltiazem as described above.

## 2010-12-26 NOTE — Assessment & Plan Note (Signed)
He has done very well during treatment with warfarin.  Colonoscopy 3 years ago was reportedly negative.  We will continue to collect annual stool Hemoccults and CBCs.

## 2010-12-26 NOTE — Assessment & Plan Note (Signed)
Patient appears to be doing well with therapy directed by Dr. Mariel Sleet.

## 2010-12-26 NOTE — Assessment & Plan Note (Addendum)
Control of heart rate is good despite the patient taking twice as much diltiazem as was prescribed.  He'll complete his current prescription as originally written at a dose of 180 mg per day.  Following that, his dose will be increased to 240 mg per day.  He will return in 2 months for a rhythm strip and blood pressure check.

## 2010-12-26 NOTE — Progress Notes (Signed)
HPI : Mr. Corning returns to the office as scheduled for continued assessment and treatment of atrial fibrillation.  Since his last visit, he has done quite well from a cardiac standpoint.  Unfortunately, he was diagnosed with follicular lymphoma presenting as a right neck mass and has required chemotherapy, which apparently has been quite successful.  He has tolerated therapy well and remains active.  He has noted no palpitations, lightheadedness, syncope, dyspnea or chest discomfort.  He has been told that blood pressures have been on the low side when assessed by health professionals.  Reconciliation of his medications revealed both Tiazac and Cardizem on his list with a notation that both were forms of diltiazem.  As a result, he was taking diltiazem 180 mg twice a day instead of once a day as prescribed.  Unfortunately, he continues to smoke cigarettes reporting consumption of one pack every 4 days.  Current Outpatient Prescriptions on File Prior to Visit  Medication Sig Dispense Refill  . ALPRAZolam (XANAX) 1 MG tablet Take 0.5 mg by mouth 4 (four) times daily.       . carvedilol (COREG) 25 MG tablet Take 25 mg by mouth 2 (two) times daily with a meal.        . furosemide (LASIX) 40 MG tablet Take 20 mg by mouth every other day.        . Hydrocodone-APAP-Dietary Prod 7.5-750 MG MISC Take 7.5 mg by mouth 4 (four) times daily as needed. 1/2 tablet qid      . lisinopril (PRINIVIL,ZESTRIL) 10 MG tablet Take 1 tablet (10 mg total) by mouth daily.  30 tablet  3  . LORazepam (ATIVAN) 1 MG tablet Take 1 mg by mouth as needed.        Marland Kitchen losartan (COZAAR) 100 MG tablet Take 100 mg by mouth daily.        . Nutritional Supplements (ENSURE PO) Take by mouth.        Marland Kitchen omeprazole (PRILOSEC) 20 MG capsule TAKE ONE (1) CAPSULE EACH DAY  30 capsule  6  . potassium chloride (KLOR-CON) 10 MEQ CR tablet Take 10 mEq by mouth daily.        . temazepam (RESTORIL) 15 MG capsule Take 15 mg by mouth at bedtime as needed.         . traMADol (ULTRAM) 50 MG tablet Take 100 mg by mouth 4 (four) times daily.        Marland Kitchen warfarin (COUMADIN) 5 MG tablet TAKE ONE TABLET ONCE DAILY EXCEPT ON    MONDAY AND FRIDAYS TAKE 1/2 TABLET  60 tablet  6  . DISCONTD: diltiazem (CARDIZEM CD) 180 MG 24 hr capsule Take 180 mg by mouth daily.        . pravastatin (PRAVACHOL) 80 MG tablet Take 1 tablet (80 mg total) by mouth every evening.  30 tablet  11   Current Facility-Administered Medications on File Prior to Visit  Medication Dose Route Frequency Provider Last Rate Last Dose  . sodium chloride 0.9 % injection 10 mL  10 mL Intracatheter PRN Randall An, MD   10 mL at 10/15/10 1230     No Known Allergies    Past medical history, social history, and family history reviewed and updated.  ROS: See history of present illness.  PHYSICAL EXAM: BP 90/58  Pulse 82  Ht 5\' 7"  (1.702 m)  Wt 98.884 kg (218 lb)  BMI 34.14 kg/m2  SpO2 97%  General-Well developed; no acute distress Body habitus-proportionate weight  and height Neck-No JVD; no carotid bruits; no significant adenopathy; barely perceptible surgical scar at the base of the right neck Lungs-clear lung fields; resonant to percussion Cardiovascular-normal PMI; normal S1 and S2; irregular rhythm Abdomen-normal bowel sounds; soft and non-tender without masses or organomegaly; aortic pulsation not palpable Musculoskeletal-No deformities, no cyanosis or clubbing Neurologic-Normal cranial nerves; symmetric strength and tone Skin-Warm, no significant lesions; very mild stasis changes in the skin around the ankles. Extremities-distal pulses intact; 1-2+ ankle edema  ASSESSMENT AND PLAN:

## 2010-12-26 NOTE — Patient Instructions (Signed)
**Note De-Identified  Obfuscation** Your physician has recommended you make the following change in your medication: when you finish current bottle of Diltiazem 180 mg increase to 240 mg daily (your pharmacy is aware of increase) and stop taking simvastatin and start taking Pravastatin 80 mg at bedtime  Your physician recommends that you complete 3 hemoccult cards, please follow instructions included in envelope. When cards are complete please return them to office.   Your physician recommends that you return for lab work in: 1 month  Your physician recommends that you schedule a follow-up appointment in: 2 months for a blood pressure check and in 1 year with Dr. Dietrich Pates

## 2010-12-26 NOTE — Assessment & Plan Note (Signed)
Cigarette consumption is reduced, but he continues to use what is probably a harmful quantity of tobacco.  He does not appear motivated to completely discontinue smoking at the present time.

## 2011-01-03 ENCOUNTER — Encounter (INDEPENDENT_AMBULATORY_CARE_PROVIDER_SITE_OTHER): Payer: Medicare Other

## 2011-01-03 DIAGNOSIS — Z7901 Long term (current) use of anticoagulants: Secondary | ICD-10-CM

## 2011-01-03 NOTE — Progress Notes (Signed)
CC:   Dr. Jeannett Senior __________ Charles [name], M.D. Dr. Kathrynn Running, Charles [name]  DIAGNOSES: 1. Stage IVA, grade 3 follicular lymphoma, CD20 positive with a huge     mass in the right neck at presentation but multiple bones involved     on PET scan.  He is now status post 6 cycles of bendamustine and     Rituxan and he is in a complete remission with a PET scan that was     totally negative as of 11/19/2010. 2. Left-sided basal cell carcinoma, surgery on the left chest wall     complicated by bleeding requiring 2 units of blood in the past. 3. History of congestive heart failure in December 2010 when he     presented with atrial fibrillation. 4. Atrial fibrillation. 5. Left kneecap removal in 1976. 6. Chronic anxiety on low-dose Xanax 4 times a day. 7. Right shoulder replacement surgery 2010 by Dr. Josephine Igo.  Charles [name] is here with his wife today to go over the results of his labs, of course, which are excellent and his PET scan which is excellent.  He is therefore in a CR but I do want to get Dr. Broadus John input about post chemotherapy radiation therapy for consolidation where this huge mass was in his right neck.  We will set that consultation up shortly.  We also discussed with him and his wife maintenance Rituxan.  He, I think, could benefit from that from a quality of life standpoint and disease-free survival standpoint.  He understands as does his wife that it may not at all impact on overall survival, but right now his quality of life is excellent.  He is doing everything he wants.  He is asymptomatic.  He has no B symptoms.  His appetite is excellent.  Bowel function is fine.  No shortness of breath. No chest pain, etc.  He was treated about 10 days ago for cellulitis of the right leg from trauma where he ran into a nail.  Urgent Care treated him here in [place] and this area on the leg looks totally normal now.  PHYSICAL EXAMINATION:  Vital signs:  Today  he has vital signs which show a weight of 213 pounds, 5 feet 7 inches tall, body surface area 2.14 sq m, BMI 33.5, blood pressure 99/66 today, pulse 80 and regular, respirations 16-18 and unlabored at rest.  He is afebrile.  Skin:  Warm and dry to the touch.  Lymph nodes:  Negative throughout.  I cannot feel a node in the cervical, supraclavicular, infraclavicular, axillary or inguinal areas.  He has no peripheral edema.  Arms are negative.  Ports intact.  Lungs:  Clear.  Heart:  Shows a regular rhythm and rate without murmur, rub or gallop.  The left chest wall changes from surgery with mild disfiguration are unchanged.  He has mild gynecomastia on the right.  Abdomen:  His abdomen is soft and nontender without organomegaly.  Bowel sounds are very active.  So he looks great.  We spent quite a bit of time talking about the future therapy plan.  They are willing to proceed with that.  We went over, of course, potential for side effects which there are very few. He will take 2 Tylenol PM before Rituxan starting in November.  We will get a followup appointment, of course, with Dr. Kathrynn Running in the very near future.  He is just out basically about 4 weeks since completion of chemotherapy with bendamustine and Rituxan.  We will see him in 3 months either way.  We will monitor his labs, of course, every 3 months.  We will decide on a PET scan in the future, perhaps in a year from now.  I think right now we will just follow him with the physical exam, lab work, etc.    ______________________________ Ladona Horns. Mariel Sleet, MD ESN/MEDQ  D:  12/11/2010  T:  12/11/2010  Job:  [id number]

## 2011-01-06 ENCOUNTER — Ambulatory Visit (INDEPENDENT_AMBULATORY_CARE_PROVIDER_SITE_OTHER): Payer: Medicare Other | Admitting: *Deleted

## 2011-01-06 DIAGNOSIS — I4891 Unspecified atrial fibrillation: Secondary | ICD-10-CM

## 2011-01-06 DIAGNOSIS — Z7901 Long term (current) use of anticoagulants: Secondary | ICD-10-CM

## 2011-01-07 ENCOUNTER — Encounter: Payer: Self-pay | Admitting: *Deleted

## 2011-01-07 ENCOUNTER — Telehealth: Payer: Self-pay | Admitting: Cardiology

## 2011-01-07 NOTE — Telephone Encounter (Signed)
Transferred call to rn

## 2011-01-10 ENCOUNTER — Other Ambulatory Visit: Payer: Self-pay | Admitting: Cardiology

## 2011-01-27 ENCOUNTER — Other Ambulatory Visit: Payer: Self-pay | Admitting: Cardiology

## 2011-01-28 LAB — CBC WITH DIFFERENTIAL/PLATELET
Basophils Absolute: 0 10*3/uL (ref 0.0–0.1)
Basophils Relative: 1 % (ref 0–1)
Eosinophils Absolute: 0.2 10*3/uL (ref 0.0–0.7)
Lymphs Abs: 0.3 10*3/uL — ABNORMAL LOW (ref 0.7–4.0)
MCH: 30.4 pg (ref 26.0–34.0)
Neutrophils Relative %: 35 % — ABNORMAL LOW (ref 43–77)
Platelets: 116 10*3/uL — ABNORMAL LOW (ref 150–400)
RBC: 3.68 MIL/uL — ABNORMAL LOW (ref 4.22–5.81)
RDW: 14.7 % (ref 11.5–15.5)

## 2011-01-28 LAB — LIPID PANEL: HDL: 43 mg/dL (ref >39)

## 2011-01-28 LAB — COMPREHENSIVE METABOLIC PANEL
ALT: 11 U/L (ref 0–53)
Albumin: 4 g/dL (ref 3.5–5.2)
CO2: 25 mEq/L (ref 19–32)
Calcium: 9.1 mg/dL (ref 8.4–10.5)
Chloride: 105 mEq/L (ref 96–112)
Glucose, Bld: 96 mg/dL (ref 70–99)
Potassium: 5.4 mEq/L — ABNORMAL HIGH (ref 3.5–5.3)
Sodium: 139 mEq/L (ref 135–145)
Total Protein: 6.5 g/dL (ref 6.0–8.3)

## 2011-01-28 LAB — PATHOLOGIST SMEAR REVIEW

## 2011-01-29 ENCOUNTER — Other Ambulatory Visit: Payer: Self-pay | Admitting: *Deleted

## 2011-01-29 ENCOUNTER — Other Ambulatory Visit (INDEPENDENT_AMBULATORY_CARE_PROVIDER_SITE_OTHER): Payer: Medicare Other | Admitting: *Deleted

## 2011-01-29 ENCOUNTER — Encounter: Payer: Self-pay | Admitting: *Deleted

## 2011-01-29 DIAGNOSIS — N289 Disorder of kidney and ureter, unspecified: Secondary | ICD-10-CM

## 2011-01-29 DIAGNOSIS — Z7901 Long term (current) use of anticoagulants: Secondary | ICD-10-CM

## 2011-01-29 DIAGNOSIS — I4891 Unspecified atrial fibrillation: Secondary | ICD-10-CM

## 2011-02-03 ENCOUNTER — Other Ambulatory Visit (HOSPITAL_COMMUNITY): Payer: Self-pay

## 2011-02-03 ENCOUNTER — Ambulatory Visit (INDEPENDENT_AMBULATORY_CARE_PROVIDER_SITE_OTHER): Payer: Medicare Other | Admitting: *Deleted

## 2011-02-03 ENCOUNTER — Encounter (HOSPITAL_COMMUNITY): Payer: Medicare Other | Attending: Oncology

## 2011-02-03 DIAGNOSIS — C8290 Follicular lymphoma, unspecified, unspecified site: Secondary | ICD-10-CM

## 2011-02-03 DIAGNOSIS — C8291 Follicular lymphoma, unspecified, lymph nodes of head, face, and neck: Secondary | ICD-10-CM

## 2011-02-03 DIAGNOSIS — I4891 Unspecified atrial fibrillation: Secondary | ICD-10-CM

## 2011-02-03 DIAGNOSIS — C8299 Follicular lymphoma, unspecified, extranodal and solid organ sites: Secondary | ICD-10-CM | POA: Insufficient documentation

## 2011-02-03 DIAGNOSIS — Z7901 Long term (current) use of anticoagulants: Secondary | ICD-10-CM

## 2011-02-03 LAB — CBC
MCHC: 33.8 g/dL (ref 30.0–36.0)
Platelets: 137 10*3/uL — ABNORMAL LOW (ref 150–400)
RDW: 14.6 % (ref 11.5–15.5)
WBC: 1.4 10*3/uL — CL (ref 4.0–10.5)

## 2011-02-03 LAB — COMPREHENSIVE METABOLIC PANEL
ALT: 14 U/L (ref 0–53)
AST: 16 U/L (ref 0–37)
Albumin: 3.4 g/dL — ABNORMAL LOW (ref 3.5–5.2)
CO2: 29 mEq/L (ref 19–32)
Calcium: 9.4 mg/dL (ref 8.4–10.5)
Chloride: 107 mEq/L (ref 96–112)
Creatinine, Ser: 1 mg/dL (ref 0.50–1.35)
GFR calc non Af Amer: 74 mL/min — ABNORMAL LOW (ref >90)
Sodium: 142 mEq/L (ref 135–145)
Total Bilirubin: 0.2 mg/dL — ABNORMAL LOW (ref 0.3–1.2)

## 2011-02-03 LAB — DIFFERENTIAL
Basophils Relative: 1 % (ref 0–1)
Eosinophils Relative: 23 % — ABNORMAL HIGH (ref 0–5)
Lymphs Abs: 0.3 10*3/uL — ABNORMAL LOW (ref 0.7–4.0)
Monocytes Absolute: 0.3 10*3/uL (ref 0.1–1.0)

## 2011-02-03 LAB — POCT INR: INR: 5.8

## 2011-02-03 NOTE — Progress Notes (Signed)
Labs drawn today for cbc/diff,cmp,ldh 

## 2011-02-04 ENCOUNTER — Encounter (HOSPITAL_BASED_OUTPATIENT_CLINIC_OR_DEPARTMENT_OTHER): Payer: Medicare Other

## 2011-02-04 ENCOUNTER — Inpatient Hospital Stay (HOSPITAL_COMMUNITY): Payer: Medicare Other

## 2011-02-04 DIAGNOSIS — C8291 Follicular lymphoma, unspecified, lymph nodes of head, face, and neck: Secondary | ICD-10-CM

## 2011-02-04 DIAGNOSIS — C8290 Follicular lymphoma, unspecified, unspecified site: Secondary | ICD-10-CM

## 2011-02-04 LAB — FOLATE: Folate: >20 ng/mL

## 2011-02-04 LAB — VITAMIN B12: Vitamin B-12: 296 pg/mL (ref 211–911)

## 2011-02-04 NOTE — Progress Notes (Signed)
Labs drawn today for b12,folate

## 2011-02-06 ENCOUNTER — Ambulatory Visit (INDEPENDENT_AMBULATORY_CARE_PROVIDER_SITE_OTHER): Payer: Medicare Other | Admitting: *Deleted

## 2011-02-06 DIAGNOSIS — I4891 Unspecified atrial fibrillation: Secondary | ICD-10-CM

## 2011-02-06 DIAGNOSIS — Z7901 Long term (current) use of anticoagulants: Secondary | ICD-10-CM

## 2011-02-17 ENCOUNTER — Encounter (HOSPITAL_BASED_OUTPATIENT_CLINIC_OR_DEPARTMENT_OTHER): Payer: Medicare Other

## 2011-02-17 ENCOUNTER — Ambulatory Visit (INDEPENDENT_AMBULATORY_CARE_PROVIDER_SITE_OTHER): Payer: Medicare Other | Admitting: *Deleted

## 2011-02-17 ENCOUNTER — Other Ambulatory Visit (HOSPITAL_COMMUNITY): Payer: Self-pay | Admitting: *Deleted

## 2011-02-17 VITALS — BP 103/66 | HR 80 | Temp 97.5°F | Wt 210.4 lb

## 2011-02-17 DIAGNOSIS — Z7901 Long term (current) use of anticoagulants: Secondary | ICD-10-CM

## 2011-02-17 DIAGNOSIS — Z5112 Encounter for antineoplastic immunotherapy: Secondary | ICD-10-CM

## 2011-02-17 DIAGNOSIS — C8290 Follicular lymphoma, unspecified, unspecified site: Secondary | ICD-10-CM

## 2011-02-17 DIAGNOSIS — I4891 Unspecified atrial fibrillation: Secondary | ICD-10-CM

## 2011-02-17 DIAGNOSIS — C8291 Follicular lymphoma, unspecified, lymph nodes of head, face, and neck: Secondary | ICD-10-CM

## 2011-02-17 LAB — DIFFERENTIAL
Lymphocytes Relative: 30 % (ref 12–46)
Lymphs Abs: 0.5 10*3/uL — ABNORMAL LOW (ref 0.7–4.0)
Monocytes Relative: 27 % — ABNORMAL HIGH (ref 3–12)
Neutro Abs: 0.5 10*3/uL — ABNORMAL LOW (ref 1.7–7.7)
Neutrophils Relative %: 29 % — ABNORMAL LOW (ref 43–77)

## 2011-02-17 LAB — CBC
Hemoglobin: 10.9 g/dL — ABNORMAL LOW (ref 13.0–17.0)
MCH: 31.2 pg (ref 26.0–34.0)
Platelets: 149 10*3/uL — ABNORMAL LOW (ref 150–400)
RBC: 3.49 MIL/uL — ABNORMAL LOW (ref 4.22–5.81)
WBC: 1.7 10*3/uL — ABNORMAL LOW (ref 4.0–10.5)

## 2011-02-17 LAB — POCT INR: INR: 2.5

## 2011-02-17 MED ORDER — SODIUM CHLORIDE 0.9 % IV SOLN
Freq: Once | INTRAVENOUS | Status: AC
  Administered 2011-02-17: 11:00:00 via INTRAVENOUS

## 2011-02-17 MED ORDER — HEPARIN SOD (PORK) LOCK FLUSH 100 UNIT/ML IV SOLN
INTRAVENOUS | Status: AC
  Filled 2011-02-17: qty 5

## 2011-02-17 MED ORDER — SODIUM CHLORIDE 0.9 % IV SOLN
375.0000 mg/m2 | Freq: Once | INTRAVENOUS | Status: AC
  Administered 2011-02-17: 800 mg via INTRAVENOUS
  Filled 2011-02-17: qty 80

## 2011-02-17 MED ORDER — HEPARIN SOD (PORK) LOCK FLUSH 100 UNIT/ML IV SOLN
500.0000 [IU] | Freq: Once | INTRAVENOUS | Status: AC | PRN
  Administered 2011-02-17: 500 [IU]
  Filled 2011-02-17: qty 5

## 2011-02-17 NOTE — Progress Notes (Signed)
Rituxan 800mg  subsequent infusion per protocol. Infusion started at 50cc/hr x 30 mins, increased to 100cc/hr x 30 mins, increased to 150cc/hr x 30 mins then 200cc/hr until infusion completed.

## 2011-02-17 NOTE — Progress Notes (Signed)
Labs drawn today for cbc/difff

## 2011-02-21 ENCOUNTER — Other Ambulatory Visit: Payer: Self-pay | Admitting: Cardiology

## 2011-02-25 ENCOUNTER — Ambulatory Visit: Payer: Medicare Other

## 2011-02-25 VITALS — BP 103/66 | HR 56 | Ht 67.0 in | Wt 215.0 lb

## 2011-02-25 DIAGNOSIS — I10 Essential (primary) hypertension: Secondary | ICD-10-CM

## 2011-02-25 NOTE — Progress Notes (Signed)
S: Pt. arrives in office for a 2 month BP f/u. B: On last OV with Dr. Dietrich Pates on 12-26-10 pt. was advised to decreased Diltiazem to 240 mg daily due to low BP, stop taking Simvastatin and start taking Pravastatin 80 mg QHS. A: Pt. has no complaints at this time. His BP this morning is 103/66 and on last OV BP was 90/58. R: Pt. advised to continue on current medical treatment and that we will contact him with Dr. Marvel Plan recommendations. ___________________________  Patient had been prescribed 180 mg per day of diltiazem, but was inadvertently taking 360 mg resulting in relative hypotension.  With a decrease to 240 mg, blood pressure is increased as has his sense of well-being.  Continue current medication with followup as previously scheduled.  Rockwell Bing, M.D.

## 2011-02-27 ENCOUNTER — Other Ambulatory Visit: Payer: Self-pay | Admitting: Cardiology

## 2011-02-28 LAB — BASIC METABOLIC PANEL
CO2: 29 mEq/L (ref 19–32)
Chloride: 105 mEq/L (ref 96–112)
Creat: 0.95 mg/dL (ref 0.50–1.35)
Glucose, Bld: 104 mg/dL — ABNORMAL HIGH (ref 70–99)

## 2011-03-03 ENCOUNTER — Ambulatory Visit (INDEPENDENT_AMBULATORY_CARE_PROVIDER_SITE_OTHER): Payer: Medicare Other | Admitting: *Deleted

## 2011-03-03 ENCOUNTER — Encounter: Payer: Medicare Other | Admitting: *Deleted

## 2011-03-03 DIAGNOSIS — Z7901 Long term (current) use of anticoagulants: Secondary | ICD-10-CM

## 2011-03-03 DIAGNOSIS — I4891 Unspecified atrial fibrillation: Secondary | ICD-10-CM

## 2011-03-12 ENCOUNTER — Encounter (HOSPITAL_COMMUNITY): Payer: Medicare Other | Attending: Oncology | Admitting: Oncology

## 2011-03-12 VITALS — BP 98/60 | HR 93 | Temp 97.4°F | Wt 214.4 lb

## 2011-03-12 DIAGNOSIS — C8291 Follicular lymphoma, unspecified, lymph nodes of head, face, and neck: Secondary | ICD-10-CM

## 2011-03-12 DIAGNOSIS — F411 Generalized anxiety disorder: Secondary | ICD-10-CM

## 2011-03-12 DIAGNOSIS — C8299 Follicular lymphoma, unspecified, extranodal and solid organ sites: Secondary | ICD-10-CM | POA: Insufficient documentation

## 2011-03-12 DIAGNOSIS — C8290 Follicular lymphoma, unspecified, unspecified site: Secondary | ICD-10-CM

## 2011-03-12 DIAGNOSIS — C44519 Basal cell carcinoma of skin of other part of trunk: Secondary | ICD-10-CM

## 2011-03-12 NOTE — Progress Notes (Signed)
This office note has been dictated.

## 2011-03-12 NOTE — Patient Instructions (Signed)
[facility]  Discharge Instructions  RECOMMENDATIONS MADE BY THE CONSULTANT AND ANY TEST RESULTS WILL BE SENT TO YOUR REFERRING DOCTOR.   EXAM FINDINGS BY MD TODAY AND SIGNS AND SYMPTOMS TO REPORT TO CLINIC OR PRIMARY MD:   Exam good.   Return to Dr. Mariel Sleet on May 26, 2011 @ 11:00.    I acknowledge that I have been informed and understand all the instructions given to me and received a copy. I do not have any more questions at this time, but understand that I may call the Specialty Clinic at [facility] at [phone] during business hours should I have any further questions or need assistance in obtaining follow-up care.    __________________________________________  _____________  __________ Signature of Patient or Authorized Representative            Date                   Time    __________________________________________ Nurse's Signature

## 2011-03-12 NOTE — Progress Notes (Signed)
CC:   Charles [name]. Charles [name], M.D. Charles [name], M.D.  DIAGNOSES: 1. Non-Hodgkin lymphoma, B-cell type, CD20 positive, stage IVA with     grade 3 follicular lymphoma with a huge mass in the right neck at     presentation.  Multiple bones were involved on PET scan and he took     6 cycles of bendamustine and Rituxan and established to complete     remission by PET scan criteria as of 11/11/2010. 2. Left-sided basal cell carcinoma surgery on left chest wall     complicated by bleeding, requiring 2 units of blood in the past. 3. History of congestive heart failure in December 2010 when he     presented with atrial fibrillation. 4. Left kneecap removal in 1976. 5. Chronic anxiety on low-dose Xanax 4 times a day. 6. Right shoulder replacement surgery in 2010 by Dr. Laroy [name]. Charles [name] is here today with his wife and I did not examine him because I have just gotten over the flu myself and may still be contagious, but he is doing well, asymptomatic.  He tolerated the 1st dose of the maintenance Rituxan extremely well without side effects.  He is not aware of anything new or different since then, but they did talk to me about their daughter, who at age 17 (this was 20+ years ago) had Hodgkin disease and now she has possibly metastatic breast cancer and they just wanted to talk about her and try to be reassured if possible.  So we spent a lot of time just in counseling, but he is doing well.  His vital signs are all great and he is not due for another PET scan until probably late August or September of next year, so we are going to continue the maintenance Rituxan.  He had labs that were excellent in November before the Rituxan on the 30th.  White count was still slightly low at 1700 total white cells.  Differential was not that remarkable.  We will just have to see what happens with his white count, but he has been infection free, feeling quite good.  The next problem that he just told  me about, however, was statin-induced aching in his joints from head to toe.  He has been off it for 2 days and feels already tremendously better.  He states that Dr. Sudie [name] diagnosed him with that on Monday, so that is really nice that he is feeling better.  Otherwise we will see him in a few months, sooner if need be, but the plan is to continue therapy with maintenance Rituxan.    ______________________________ Charles [name]. Charles Sleet, MD Charles [name]/MEDQ  D:  03/12/2011  T:  03/12/2011  Job:  [id number]

## 2011-03-16 ENCOUNTER — Encounter (HOSPITAL_COMMUNITY): Payer: Self-pay | Admitting: *Deleted

## 2011-03-16 ENCOUNTER — Emergency Department (HOSPITAL_COMMUNITY)
Admission: EM | Admit: 2011-03-16 | Discharge: 2011-03-16 | Disposition: A | Discharge status: Home / Self Care | Payer: Medicare Other | Attending: Emergency Medicine | Admitting: Emergency Medicine

## 2011-03-16 ENCOUNTER — Emergency Department (HOSPITAL_COMMUNITY): Payer: Medicare Other

## 2011-03-16 DIAGNOSIS — G319 Degenerative disease of nervous system, unspecified: Secondary | ICD-10-CM | POA: Insufficient documentation

## 2011-03-16 DIAGNOSIS — F17200 Nicotine dependence, unspecified, uncomplicated: Secondary | ICD-10-CM | POA: Insufficient documentation

## 2011-03-16 DIAGNOSIS — Y92009 Unspecified place in unspecified non-institutional (private) residence as the place of occurrence of the external cause: Secondary | ICD-10-CM | POA: Insufficient documentation

## 2011-03-16 DIAGNOSIS — K279 Peptic ulcer, site unspecified, unspecified as acute or chronic, without hemorrhage or perforation: Secondary | ICD-10-CM | POA: Insufficient documentation

## 2011-03-16 DIAGNOSIS — Z7901 Long term (current) use of anticoagulants: Secondary | ICD-10-CM | POA: Insufficient documentation

## 2011-03-16 DIAGNOSIS — IMO0002 Reserved for concepts with insufficient information to code with codable children: Secondary | ICD-10-CM

## 2011-03-16 DIAGNOSIS — M159 Polyosteoarthritis, unspecified: Secondary | ICD-10-CM | POA: Insufficient documentation

## 2011-03-16 DIAGNOSIS — Z87898 Personal history of other specified conditions: Secondary | ICD-10-CM | POA: Insufficient documentation

## 2011-03-16 DIAGNOSIS — I428 Other cardiomyopathies: Secondary | ICD-10-CM | POA: Insufficient documentation

## 2011-03-16 DIAGNOSIS — W19XXXA Unspecified fall, initial encounter: Secondary | ICD-10-CM

## 2011-03-16 DIAGNOSIS — Z8701 Personal history of pneumonia (recurrent): Secondary | ICD-10-CM | POA: Insufficient documentation

## 2011-03-16 DIAGNOSIS — I10 Essential (primary) hypertension: Secondary | ICD-10-CM | POA: Insufficient documentation

## 2011-03-16 DIAGNOSIS — S1093XA Contusion of unspecified part of neck, initial encounter: Secondary | ICD-10-CM | POA: Insufficient documentation

## 2011-03-16 DIAGNOSIS — I4891 Unspecified atrial fibrillation: Secondary | ICD-10-CM | POA: Insufficient documentation

## 2011-03-16 DIAGNOSIS — S0003XA Contusion of scalp, initial encounter: Secondary | ICD-10-CM | POA: Insufficient documentation

## 2011-03-16 DIAGNOSIS — Z85828 Personal history of other malignant neoplasm of skin: Secondary | ICD-10-CM | POA: Insufficient documentation

## 2011-03-16 DIAGNOSIS — I499 Cardiac arrhythmia, unspecified: Secondary | ICD-10-CM | POA: Insufficient documentation

## 2011-03-16 DIAGNOSIS — S0180XA Unspecified open wound of other part of head, initial encounter: Secondary | ICD-10-CM | POA: Insufficient documentation

## 2011-03-16 DIAGNOSIS — W010XXA Fall on same level from slipping, tripping and stumbling without subsequent striking against object, initial encounter: Secondary | ICD-10-CM | POA: Insufficient documentation

## 2011-03-16 LAB — PROTIME-INR: INR: 2.11 — ABNORMAL HIGH (ref 0.00–1.49)

## 2011-03-16 NOTE — ED Notes (Signed)
Pt returned from CT. NAD.

## 2011-03-16 NOTE — ED Notes (Signed)
Pt to CT

## 2011-03-16 NOTE — ED Notes (Signed)
Pt tripped over rug in house and fell and hit his head on the hardwood floor. Pt has lac over right eye. Pt c/o pain above eye but no other pain. Pt denies LOC.

## 2011-03-16 NOTE — ED Provider Notes (Signed)
History     CSN: [id number]  Arrival date & time 03/16/11  1191   First MD Initiated Contact with Patient 03/16/11 0547      Chief Complaint  Patient presents with  . Fall  . Facial Laceration    (Consider location/radiation/quality/duration/timing/severity/associated sxs/prior treatment) HPI Comments: Charles [name] is a 70 y.o. male who presents to the Emergency Department complaining of fall at home last night resulting in a laceration to his right eye brow. He tripped over a rug hitting his head/face on the floor. No LOC. Denies vision changes, difficulty swallowing, difficulty or pain with opening his mouth, nasal discomfort, neck pain,    Patient is a 70 y.o. male presenting with fall.  Fall    Past Medical History  Diagnosis Date  . 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 Hypertension   . Atrial fibrillation 10/2008  . Nonischemic cardiomyopathy 02/2009    Presented with CHF; EF of 25-30%; Nonobstructive ASCVD-worst lesion 40% on coronary angiography  . Peptic ulcer disease   . Basal cell carcinoma   . Degenerative joint disease     Knees and shoulders  . Pneumonia 2010  . Low TSH level     Borderline  . Allergic rhinitis   . Anxiety   . Drug abuse   . Tobacco abuse     50 pack years    Past Surgical History  Procedure Date  . Neck lesion biopsy June 24, 2010    Follicular lymphoma  . Skin cancer excision     under left breast; variously described as melanoma and basal cell  . Rotator cuff repair     right  . Patella fracture surgery 1975  . Hematoma evacuation     after melanoma removal  . Portacath placement 07/12/2010  . Colonoscopy 2009    Also underwent an EGD; patient reports no significant findings    Family History  Problem Relation Age of Onset  . Heart attack Father   . Coronary artery disease Other     History  Substance Use Topics  . Smoking status: Current Everyday Smoker -- 1.0 packs/day for 50 years    Types: Cigarettes  .  Smokeless tobacco: Not on file   Comment: Recent consumption of 1/5 pack a day  . Alcohol Use: No      Review of Systems  Allergies  Review of patient's allergies indicates no known allergies.  Home Medications   Current Outpatient Rx  Name Route Sig Dispense Refill  . ALPRAZOLAM 1 MG PO TABS Oral Take 0.5 mg by mouth 4 (four) times daily.     Marland Kitchen CARVEDILOL 25 MG PO TABS  TAKE ONE TABLET TWICE DAILY 60 tablet 10  . DILTIAZEM HCL ER COATED BEADS 240 MG PO CP24 Oral Take 1 capsule (240 mg total) by mouth daily. 30 capsule 11    Dose increase  . FUROSEMIDE 40 MG PO TABS Oral Take 20 mg by mouth every other day.      Marland Kitchen HYDROCODONE-APAP-DIETARY PROD 7.5-750 MG PO MISC Oral Take 7.5 mg by mouth 4 (four) times daily as needed. 1/2 tablet qid    . LISINOPRIL 10 MG PO TABS Oral Take 1 tablet (10 mg total) by mouth daily. 30 tablet 3  . LORAZEPAM 1 MG PO TABS Oral Take 1 mg by mouth as needed.      . ENSURE PO Oral Take by mouth.      . OMEPRAZOLE 20 MG PO CPDR  TAKE ONE (1) CAPSULE  EACH DAY 30 capsule 6  . POTASSIUM CHLORIDE CR 10 MEQ PO TBCR  TAKE ONE (1) TABLET EACH DAY 30 tablet 12  . TRAMADOL HCL 50 MG PO TABS Oral Take 100 mg by mouth 4 (four) times daily.      . WARFARIN SODIUM 5 MG PO TABS  TAKE ONE TABLET ONCE DAILY EXCEPT ON    MONDAY AND FRIDAYS TAKE 1/2 TABLET 60 tablet 6  . ZOLPIDEM TARTRATE 10 MG PO TABS Oral Take 10 mg by mouth at bedtime as needed.        BP 124/75  Pulse 80  Resp 20  Ht 5\' 11"  (1.803 m)  Wt 209 lb (94.802 kg)  BMI 29.15 kg/m2  SpO2 99%  Physical Exam  Nursing note and vitals reviewed. Constitutional: He is oriented to person, place, and time. He appears well-developed and well-nourished.  HENT:  Head: Normocephalic.       Small laceration 1 cm to right eyebrow. Bruising to right forehead and right cheek bone.  Eyes: Conjunctivae and EOM are normal. Pupils are equal, round, and reactive to light.  Neck: Normal range of motion. Neck supple.    Cardiovascular: Normal heart sounds and intact distal pulses.        Irregular heart beat  Pulmonary/Chest: Effort normal and breath sounds normal.  Abdominal: Soft.  Musculoskeletal: Normal range of motion.  Neurological: He is alert and oriented to person, place, and time. He has normal reflexes.  Skin: Skin is warm and dry.    ED Course  Procedures (including critical care time) Results for orders placed during the hospital encounter of 03/16/11  PROTIME-INR      Component Value Range   Prothrombin Time 24.0 (*) 11.6 - 15.2 (seconds)   INR 2.11 (*) 0.00 - 1.49       MDM  Patient fell at home sustaining a laceration to the right eyebrow. On chronic coumadin for atrial fibrillation. Coumadin is therapeutic. CT negative for acute injury.Pt stable in ED with no significant deterioration in condition.The patient appears reasonably screened and/or stabilized for discharge and I doubt any other medical condition or other [facility] requiring further screening, evaluation, or treatment in the ED at this time prior to discharge.  MDM Reviewed: nursing note and vitals Interpretation: labs and CT scan             Nicoletta Dress. Colon Branch, MD 03/16/11 [phone]

## 2011-03-16 NOTE — ED Notes (Signed)
Face cleaned prior to d/c.

## 2011-03-19 ENCOUNTER — Ambulatory Visit (INDEPENDENT_AMBULATORY_CARE_PROVIDER_SITE_OTHER): Payer: Medicare Other | Admitting: *Deleted

## 2011-03-19 DIAGNOSIS — Z7901 Long term (current) use of anticoagulants: Secondary | ICD-10-CM

## 2011-03-19 DIAGNOSIS — I4891 Unspecified atrial fibrillation: Secondary | ICD-10-CM

## 2011-03-19 LAB — POCT INR: INR: 2.2

## 2011-03-28 ENCOUNTER — Encounter (HOSPITAL_COMMUNITY): Payer: Medicare Other | Attending: Oncology

## 2011-03-28 DIAGNOSIS — C8299 Follicular lymphoma, unspecified, extranodal and solid organ sites: Secondary | ICD-10-CM | POA: Insufficient documentation

## 2011-03-28 DIAGNOSIS — C8291 Follicular lymphoma, unspecified, lymph nodes of head, face, and neck: Secondary | ICD-10-CM

## 2011-03-28 DIAGNOSIS — C8290 Follicular lymphoma, unspecified, unspecified site: Secondary | ICD-10-CM

## 2011-03-28 LAB — CBC
MCH: 30.5 pg (ref 26.0–34.0)
MCHC: 33.8 g/dL (ref 30.0–36.0)
MCV: 90.3 fL (ref 78.0–100.0)
Platelets: 174 10*3/uL (ref 150–400)
RDW: 14 % (ref 11.5–15.5)

## 2011-03-28 LAB — DIFFERENTIAL
Eosinophils Relative: 20 % — ABNORMAL HIGH (ref 0–5)
Neutro Abs: 0.5 10*3/uL — ABNORMAL LOW (ref 1.7–7.7)

## 2011-03-28 MED ORDER — SODIUM CHLORIDE 0.9 % IJ SOLN
10.0000 mL | INTRAMUSCULAR | Status: DC | PRN
  Administered 2011-03-28: 10 mL via INTRAVENOUS
  Filled 2011-03-28: qty 10

## 2011-03-28 MED ORDER — SODIUM CHLORIDE 0.9 % IJ SOLN
INTRAMUSCULAR | Status: AC
  Administered 2011-03-28: 10 mL via INTRAVENOUS
  Filled 2011-03-28: qty 10

## 2011-03-28 MED ORDER — HEPARIN SOD (PORK) LOCK FLUSH 100 UNIT/ML IV SOLN
500.0000 [IU] | Freq: Once | INTRAVENOUS | Status: AC
  Administered 2011-03-28: 500 [IU] via INTRAVENOUS
  Filled 2011-03-28: qty 5

## 2011-03-28 MED ORDER — HEPARIN SOD (PORK) LOCK FLUSH 100 UNIT/ML IV SOLN
INTRAVENOUS | Status: AC
  Administered 2011-03-28: 500 [IU] via INTRAVENOUS
  Filled 2011-03-28: qty 5

## 2011-03-28 NOTE — Progress Notes (Signed)
Tolerated flush well. 

## 2011-04-07 ENCOUNTER — Ambulatory Visit (INDEPENDENT_AMBULATORY_CARE_PROVIDER_SITE_OTHER): Payer: Medicare Other | Admitting: *Deleted

## 2011-04-07 DIAGNOSIS — I4891 Unspecified atrial fibrillation: Secondary | ICD-10-CM

## 2011-04-07 DIAGNOSIS — Z7901 Long term (current) use of anticoagulants: Secondary | ICD-10-CM

## 2011-04-07 LAB — POCT INR: INR: 1.9

## 2011-04-17 ENCOUNTER — Ambulatory Visit (INDEPENDENT_AMBULATORY_CARE_PROVIDER_SITE_OTHER): Payer: Medicare Other | Admitting: Adult Health

## 2011-04-17 ENCOUNTER — Encounter: Payer: Self-pay | Admitting: Adult Health

## 2011-04-17 ENCOUNTER — Ambulatory Visit: Payer: Medicare Other | Admitting: Cardiology

## 2011-04-17 DIAGNOSIS — F17200 Nicotine dependence, unspecified, uncomplicated: Secondary | ICD-10-CM

## 2011-04-17 DIAGNOSIS — E782 Mixed hyperlipidemia: Secondary | ICD-10-CM

## 2011-04-17 DIAGNOSIS — E785 Hyperlipidemia, unspecified: Secondary | ICD-10-CM | POA: Insufficient documentation

## 2011-04-17 DIAGNOSIS — I10 Essential (primary) hypertension: Secondary | ICD-10-CM

## 2011-04-17 DIAGNOSIS — I4891 Unspecified atrial fibrillation: Secondary | ICD-10-CM

## 2011-04-17 MED ORDER — NIACIN ER (ANTIHYPERLIPIDEMIC) 500 MG PO TBCR: 500.0000 mg | EXTENDED_RELEASE_TABLET | Freq: Every day | ORAL | Status: DC

## 2011-04-17 NOTE — Patient Instructions (Signed)
Your physician has recommended you make the following change in your medication: start taking Fish oil 1500 mg daily and Niaspan 500 mg at bedtime.  Your physician recommends that you return for lab work in: 3 months  Your physician recommends that you schedule a follow-up appointment in: 6 months

## 2011-04-17 NOTE — Assessment & Plan Note (Signed)
He is intolerant to statin therapy secondary to severe myalgias. Will start him on Fish Oil 1500 mg daily and Niaspan 500 mg at HS. He is given warning of possible flushing with niaspan. He will have follow-up lipid study in 3 months. He is advised to watch his cholesterol intake with reduced fat diet.

## 2011-04-17 NOTE — Assessment & Plan Note (Signed)
He has cut down for 1 1/2 packs to 3 cigarettes a day. He is encouraged to quit completely.

## 2011-04-17 NOTE — Progress Notes (Signed)
HPI: Charles [name] is a 71 y/o patient of Dr.Rothbart we are seeing or ongoing assessment and treatment of permanent atrial fib, hypertension,nonobstructive CAD, hypercholesterolemia, with history of follicular lymphoma and right neck mass, s/p chemo. He has had complaints of severe myalgia's and was taken off of pravastatin by Dr. [name] since being seen last. He feels "100% better" off of the statin. He has cut down to 3 cigarettes a day. He denies chest pain, DOE, or palpitations.   No Known Allergies  Current Outpatient Prescriptions  Medication Sig Dispense Refill  . ALPRAZolam (XANAX) 1 MG tablet Take 0.5 mg by mouth 4 (four) times daily.       . carvedilol (COREG) 25 MG tablet TAKE ONE TABLET TWICE DAILY  60 tablet  10  . diltiazem (CARDIZEM CD) 240 MG 24 hr capsule Take 1 capsule (240 mg total) by mouth daily.  30 capsule  11  . furosemide (LASIX) 40 MG tablet Take 20 mg by mouth every other day.        . Hydrocodone-APAP-Dietary Prod 7.5-750 MG MISC Take 7.5 mg by mouth 4 (four) times daily as needed. 1/2 tablet qid      . lisinopril (PRINIVIL,ZESTRIL) 10 MG tablet Take 1 tablet (10 mg total) by mouth daily.  30 tablet  3  . LORazepam (ATIVAN) 1 MG tablet Take 1 mg by mouth as needed.        . Nutritional Supplements (ENSURE PO) Take by mouth.        Marland Kitchen omeprazole (PRILOSEC) 20 MG capsule TAKE ONE (1) CAPSULE EACH DAY  30 capsule  6  . traMADol (ULTRAM) 50 MG tablet Take 100 mg by mouth 4 (four) times daily.        Marland Kitchen warfarin (COUMADIN) 5 MG tablet TAKE ONE TABLET ONCE DAILY EXCEPT ON    MONDAY AND FRIDAYS TAKE 1/2 TABLET  60 tablet  6  . zolpidem (AMBIEN) 10 MG tablet Take 10 mg by mouth at bedtime as needed.         No current facility-administered medications for this visit.   Facility-Administered Medications Ordered in Other Visits  Medication Dose Route Frequency Provider Last Rate Last Dose  . sodium chloride 0.9 % injection 10 mL  10 mL Intracatheter PRN Randall An,  MD   10 mL at 10/15/10 1230    Past Medical History  Diagnosis Date  . 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 Hypertension   . Atrial fibrillation 10/2008  . Nonischemic cardiomyopathy 02/2009    Presented with CHF; EF of 25-30%; Nonobstructive ASCVD-worst lesion 40% on coronary angiography  . Peptic ulcer disease   . Basal cell carcinoma   . Degenerative joint disease     Knees and shoulders  . Pneumonia 2010  . Low TSH level     Borderline  . Allergic rhinitis   . Anxiety   . Drug abuse   . Tobacco abuse     50 pack years    Past Surgical History  Procedure Date  . Neck lesion biopsy June 24, 2010    Follicular lymphoma  . Skin cancer excision     under left breast; variously described as melanoma and basal cell  . Rotator cuff repair     right  . Patella fracture surgery 1975  . Hematoma evacuation     after melanoma removal  . Portacath placement 07/12/2010  . Colonoscopy 2009    Also underwent an EGD; patient reports no significant findings  OZH:YQMVHQ of systems complete and found to be negative unless listed above PHYSICAL EXAM BP 122/77  Pulse 63  Resp 16  Ht 5\' 7"  (1.702 m)  Wt 214 lb (97.07 kg)  BMI 33.52 kg/m2  General: Well developed, well nourished, in no acute distress Head: Eyes PERRLA, No xanthomas.   Normal cephalic and atramatic  Lungs: Clear bilaterally to auscultation and percussion with some basilar crackles and prolonged expiratory phase.Marland Kitchen Heart: HRIR without MRG.  Pulses are 2+ & equal.            No carotid bruit. No JVD.  No abdominal bruits. No femoral bruits. Abdomen: Bowel sounds are positive, abdomen soft and non-tender without masses or                  Hernia's noted. Msk:  Back normal, normal gait. Normal strength and tone for age. Extremities: No clubbing, cyanosis or edema.  DP +1 Neuro: Alert and oriented X 3. Psych:  Good affect, responds appropriately  EKG: Atrial fibrillation/coarse rate of 75 bpm.  ASSESSMENT AND PLAN

## 2011-04-17 NOTE — Assessment & Plan Note (Signed)
He is doing well and is without complaint. Tolerating coumadin without bleeding issues. He is complaint with his medications. No changes at this time. Heart rate is well controlled.

## 2011-04-21 ENCOUNTER — Ambulatory Visit (INDEPENDENT_AMBULATORY_CARE_PROVIDER_SITE_OTHER): Payer: Medicare Other | Admitting: *Deleted

## 2011-04-21 DIAGNOSIS — Z7901 Long term (current) use of anticoagulants: Secondary | ICD-10-CM

## 2011-04-21 DIAGNOSIS — I4891 Unspecified atrial fibrillation: Secondary | ICD-10-CM

## 2011-05-12 ENCOUNTER — Encounter: Payer: Medicare Other | Admitting: *Deleted

## 2011-05-14 ENCOUNTER — Ambulatory Visit (INDEPENDENT_AMBULATORY_CARE_PROVIDER_SITE_OTHER): Payer: Medicare Other | Admitting: *Deleted

## 2011-05-14 DIAGNOSIS — Z7901 Long term (current) use of anticoagulants: Secondary | ICD-10-CM

## 2011-05-14 DIAGNOSIS — I4891 Unspecified atrial fibrillation: Secondary | ICD-10-CM

## 2011-05-19 ENCOUNTER — Ambulatory Visit (HOSPITAL_COMMUNITY)
Admission: RE | Admit: 2011-05-19 | Discharge: 2011-05-19 | Disposition: A | Discharge status: Home / Self Care | Payer: Medicare Other | Source: Ambulatory Visit | Attending: Oncology | Admitting: Oncology

## 2011-05-19 ENCOUNTER — Encounter (HOSPITAL_COMMUNITY): Payer: Medicare Other | Attending: Oncology

## 2011-05-19 ENCOUNTER — Encounter (HOSPITAL_COMMUNITY): Payer: Medicare Other

## 2011-05-19 VITALS — BP 90/53 | HR 97 | Temp 99.1°F

## 2011-05-19 DIAGNOSIS — C8291 Follicular lymphoma, unspecified, lymph nodes of head, face, and neck: Secondary | ICD-10-CM

## 2011-05-19 DIAGNOSIS — R05 Cough: Secondary | ICD-10-CM | POA: Insufficient documentation

## 2011-05-19 DIAGNOSIS — R059 Cough, unspecified: Secondary | ICD-10-CM | POA: Insufficient documentation

## 2011-05-19 DIAGNOSIS — C8290 Follicular lymphoma, unspecified, unspecified site: Secondary | ICD-10-CM

## 2011-05-19 DIAGNOSIS — C8589 Other specified types of non-Hodgkin lymphoma, extranodal and solid organ sites: Secondary | ICD-10-CM | POA: Insufficient documentation

## 2011-05-19 DIAGNOSIS — C8299 Follicular lymphoma, unspecified, extranodal and solid organ sites: Secondary | ICD-10-CM | POA: Insufficient documentation

## 2011-05-19 LAB — CBC
HCT: 29.3 % — ABNORMAL LOW (ref 39.0–52.0)
Hemoglobin: 10.1 g/dL — ABNORMAL LOW (ref 13.0–17.0)
MCH: 30.2 pg (ref 26.0–34.0)
MCHC: 34.5 g/dL (ref 30.0–36.0)
MCV: 87.7 fL (ref 78.0–100.0)
RBC: 3.34 MIL/uL — ABNORMAL LOW (ref 4.22–5.81)

## 2011-05-19 LAB — COMPREHENSIVE METABOLIC PANEL
ALT: 10 U/L (ref 0–53)
AST: 13 U/L (ref 0–37)
Alkaline Phosphatase: 94 U/L (ref 39–117)
GFR calc Af Amer: 84 mL/min — ABNORMAL LOW (ref >90)
Glucose, Bld: 130 mg/dL — ABNORMAL HIGH (ref 70–99)
Potassium: 3.9 mEq/L (ref 3.5–5.1)
Sodium: 135 mEq/L (ref 135–145)
Total Protein: 6.5 g/dL (ref 6.0–8.3)

## 2011-05-19 LAB — DIFFERENTIAL
Basophils Relative: 2 % — ABNORMAL HIGH (ref 0–1)
Eosinophils Absolute: 0.3 10*3/uL (ref 0.0–0.7)
Lymphs Abs: 0.4 10*3/uL — ABNORMAL LOW (ref 0.7–4.0)
Monocytes Absolute: 0.5 10*3/uL (ref 0.1–1.0)
Monocytes Relative: 18 % — ABNORMAL HIGH (ref 3–12)

## 2011-05-19 NOTE — Progress Notes (Deleted)
Labs drawn today for cbc/diff 

## 2011-05-19 NOTE — Progress Notes (Signed)
Pt c/o cold and congestion. Coughing up "brownish" phlegm and temp 99.1. Talked with Dr. Mariel Sleet and  Orders received to hold rituxan one week and get chest x-ray and extra labs.

## 2011-05-19 NOTE — Progress Notes (Signed)
Labs drawn today for cbc/diff 

## 2011-05-20 ENCOUNTER — Other Ambulatory Visit: Payer: Self-pay

## 2011-05-20 ENCOUNTER — Telehealth (HOSPITAL_COMMUNITY): Payer: Self-pay | Admitting: *Deleted

## 2011-05-20 LAB — IGG, IGA, IGM: IgG (Immunoglobin G), Serum: 837 mg/dL (ref 650–1600)

## 2011-05-20 MED ORDER — WARFARIN SODIUM 5 MG PO TABS: 5.0000 mg | ORAL_TABLET | ORAL | Status: DC

## 2011-05-20 NOTE — Telephone Encounter (Signed)
Pt's wife called to report that Charles [name] is no better. Been in bed all day today. Feels warm but she has not taken his temp. She feels he needs antibiotic. He had chest xray and labs yesterday.. Please advise.

## 2011-05-26 ENCOUNTER — Encounter (HOSPITAL_COMMUNITY): Payer: Medicare Other | Attending: Oncology | Admitting: Oncology

## 2011-05-26 VITALS — BP 113/76 | HR 114 | Temp 96.6°F | Wt 212.3 lb

## 2011-05-26 DIAGNOSIS — N400 Enlarged prostate without lower urinary tract symptoms: Secondary | ICD-10-CM | POA: Insufficient documentation

## 2011-05-26 DIAGNOSIS — F411 Generalized anxiety disorder: Secondary | ICD-10-CM

## 2011-05-26 DIAGNOSIS — C8291 Follicular lymphoma, unspecified, lymph nodes of head, face, and neck: Secondary | ICD-10-CM

## 2011-05-26 DIAGNOSIS — C8290 Follicular lymphoma, unspecified, unspecified site: Secondary | ICD-10-CM

## 2011-05-26 DIAGNOSIS — C8299 Follicular lymphoma, unspecified, extranodal and solid organ sites: Secondary | ICD-10-CM | POA: Insufficient documentation

## 2011-05-26 DIAGNOSIS — D649 Anemia, unspecified: Secondary | ICD-10-CM

## 2011-05-26 LAB — BASIC METABOLIC PANEL
BUN: 16 mg/dL (ref 6–23)
CO2: 27 mEq/L (ref 19–32)
Chloride: 104 mEq/L (ref 96–112)
Creatinine, Ser: 1.07 mg/dL (ref 0.50–1.35)

## 2011-05-26 LAB — DIFFERENTIAL
Eosinophils Relative: 11 % — ABNORMAL HIGH (ref 0–5)
Lymphocytes Relative: 11 % — ABNORMAL LOW (ref 12–46)
Monocytes Absolute: 0.4 10*3/uL (ref 0.1–1.0)
Monocytes Relative: 17 % — ABNORMAL HIGH (ref 3–12)
Neutro Abs: 1.4 10*3/uL — ABNORMAL LOW (ref 1.7–7.7)

## 2011-05-26 LAB — CBC
HCT: 29 % — ABNORMAL LOW (ref 39.0–52.0)
Hemoglobin: 9.8 g/dL — ABNORMAL LOW (ref 13.0–17.0)
MCHC: 33.8 g/dL (ref 30.0–36.0)
MCV: 87.9 fL (ref 78.0–100.0)
WBC: 2.4 10*3/uL — ABNORMAL LOW (ref 4.0–10.5)

## 2011-05-26 MED ORDER — SODIUM CHLORIDE 0.9 % IV SOLN
Freq: Once | INTRAVENOUS | Status: AC
  Administered 2011-05-26: 1000 mL via INTRAVENOUS

## 2011-05-26 MED ORDER — TAMSULOSIN HCL 0.4 MG PO CAPS
0.4000 mg | ORAL_CAPSULE | Freq: Two times a day (BID) | ORAL | Status: DC
  Filled 2011-05-26 (×2): qty 1

## 2011-05-26 MED ORDER — HEPARIN SOD (PORK) LOCK FLUSH 100 UNIT/ML IV SOLN
INTRAVENOUS | Status: AC
  Filled 2011-05-26: qty 5

## 2011-05-26 NOTE — Progress Notes (Signed)
This office note has been dictated.

## 2011-05-26 NOTE — Patient Instructions (Addendum)
[facility]  Discharge Instructions  RECOMMENDATIONS MADE BY THE CONSULTANT AND ANY TEST RESULTS WILL BE SENT TO YOUR REFERRING DOCTOR.   EXAM FINDINGS BY MD TODAY AND SIGNS AND SYMPTOMS TO REPORT TO CLINIC OR PRIMARY MD: Dr. Mariel Sleet thinks you are dehydrated and that may be most of the problem. We will also get a pet scan to make sure there is nothing else going on.  We will hold rituxan until after scans.  MEDICATIONS PRESCRIBED: tamsulosin 0.4 mg one daily called to [facility].   INSTRUCTIONS GIVEN AND DISCUSSED: 1000 cc nsaline IV over 3 hrs.  SPECIAL INSTRUCTIONS/FOLLOW-UP: Xray Studies Needed --Pet scan then back to see the Dr.   I acknowledge that I have been informed and understand all the instructions given to me and received a copy. I do not have any more questions at this time, but understand that I may call the Specialty Clinic at [facility] at [phone] during business hours should I have any further questions or need assistance in obtaining follow-up care.    __________________________________________  _____________  __________ Signature of Patient or Authorized Representative            Date                   Time    __________________________________________ Nurse's Signature

## 2011-05-26 NOTE — Progress Notes (Signed)
CC:   Charles [name]. [name], M.D.  DIAGNOSES: 1. Non-Hodgkin lymphoma, B-cell type, CD20 positive, stage IVA, grade     3 follicular lymphoma that presented with a huge mass in the right     neck at presentation.  Multiple bones were involved on his PET     scan.  Status post 6 cycles of bendamustine and Rituxan with a     complete remission by PET scan criteria as of 11/11/2010. 2. Possible bronchitis, now on Levaquin since last Thursday. 3. Probable dehydration today with weakness and fatigue. 4. Arthritis of the knees. 5. History of congestive heart failure in December 2010 when he     presented with atrial fibrillation. 6. Chronic anxiety, on low-dose Xanax 4 times a day. 7. Left kneecap removal in 1976. 8. Right shoulder replacement surgery in 2010 by Dr. Cleone Slim Cypher. Verdis called [place] last week.  He had been sick for about a week with what sounded like a virus but we got a chest x-ray, which suggested either atelectasis or possible bronchitis, but when I called him on Wednesday he stated he was much improved.  I had also checked his immunoglobulins and they were intact.  He was still mildly anemic. White count was still mildly low.  He had some other blood work on the 25th of February showing a sugar of 130 but normal liver enzymes, slightly low albumin, though, of 3 g.  His BUN and creatinine, etc., were normal.  We checked him again today thinking he was dehydrated because his mouth was extremely dry.  His BUN/creatinine ratio is only 16, but he certainly was a very dry mouth. He was very weak, was not disoriented, but his wife states that he had been talking a little bit "funny at times."  He is not having headaches, does not have any trouble with bowels or bladder function.  He does not have any trouble walking except for in his knees, he states.  I could not find any adenopathy on exam.  He was afebrile, in no acute distress, just very weak and chronically ill-appearing.   Lungs:  Clear to auscultation except for few rales at the right base which basically cleared with coughing.  Heart:  Showed a regular rhythm without S3 gallop.  Abdomen:  Obese, nontender, without organomegaly.  So we will see him back in a few weeks, but I do want to do a PET scan on him especially since these labs were nonspecific and not very helpful and he has not been feeling well.  I do not think this is just a recent virus potentially.    ______________________________ Ladona Horns. Mariel Sleet, MD ESN/MEDQ  D:  05/26/2011  T:  05/26/2011  Job:  [id number]

## 2011-05-27 ENCOUNTER — Inpatient Hospital Stay (HOSPITAL_COMMUNITY): Payer: Medicare Other

## 2011-06-06 ENCOUNTER — Telehealth (HOSPITAL_COMMUNITY): Payer: Self-pay | Admitting: *Deleted

## 2011-06-06 ENCOUNTER — Encounter (HOSPITAL_COMMUNITY): Payer: Self-pay

## 2011-06-06 ENCOUNTER — Encounter (HOSPITAL_COMMUNITY)
Admission: RE | Admit: 2011-06-06 | Discharge: 2011-06-06 | Disposition: A | Discharge status: Home / Self Care | Payer: Medicare Other | Source: Ambulatory Visit | Attending: Oncology | Admitting: Oncology

## 2011-06-06 DIAGNOSIS — J984 Other disorders of lung: Secondary | ICD-10-CM | POA: Insufficient documentation

## 2011-06-06 DIAGNOSIS — N281 Cyst of kidney, acquired: Secondary | ICD-10-CM | POA: Insufficient documentation

## 2011-06-06 DIAGNOSIS — C8589 Other specified types of non-Hodgkin lymphoma, extranodal and solid organ sites: Secondary | ICD-10-CM | POA: Insufficient documentation

## 2011-06-06 DIAGNOSIS — C8290 Follicular lymphoma, unspecified, unspecified site: Secondary | ICD-10-CM

## 2011-06-06 LAB — GLUCOSE, CAPILLARY: Glucose-Capillary: 98 mg/dL (ref 70–99)

## 2011-06-06 MED ORDER — FLUDEOXYGLUCOSE F - 18 (FDG) INJECTION
17.1000 | Freq: Once | INTRAVENOUS | Status: AC | PRN
  Administered 2011-06-06: 17.1 via INTRAVENOUS

## 2011-06-06 NOTE — Telephone Encounter (Signed)
Message copied by Dennie Maizes on Fri Jun 06, 2011  2:43 PM ------      Message from: Mariel Sleet, ERIC S      Created: Fri Jun 06, 2011  1:49 PM       Call his wife-no evidence for tumor.

## 2011-06-06 NOTE — Telephone Encounter (Signed)
Spoke with pt's wife Carney Bern as below. Verbalized understanding.

## 2011-06-10 ENCOUNTER — Ambulatory Visit (HOSPITAL_COMMUNITY): Payer: Medicare Other | Admitting: Oncology

## 2011-06-11 ENCOUNTER — Ambulatory Visit (INDEPENDENT_AMBULATORY_CARE_PROVIDER_SITE_OTHER): Payer: Medicare Other | Admitting: *Deleted

## 2011-06-11 DIAGNOSIS — Z7901 Long term (current) use of anticoagulants: Secondary | ICD-10-CM

## 2011-06-11 DIAGNOSIS — I4891 Unspecified atrial fibrillation: Secondary | ICD-10-CM

## 2011-06-11 LAB — POCT INR: INR: 2.6

## 2011-06-13 ENCOUNTER — Encounter (HOSPITAL_BASED_OUTPATIENT_CLINIC_OR_DEPARTMENT_OTHER): Payer: Medicare Other | Admitting: Oncology

## 2011-06-13 VITALS — BP 119/78 | HR 89 | Temp 97.6°F | Wt 210.7 lb

## 2011-06-13 DIAGNOSIS — R911 Solitary pulmonary nodule: Secondary | ICD-10-CM

## 2011-06-13 DIAGNOSIS — C8291 Follicular lymphoma, unspecified, lymph nodes of head, face, and neck: Secondary | ICD-10-CM

## 2011-06-13 DIAGNOSIS — J449 Chronic obstructive pulmonary disease, unspecified: Secondary | ICD-10-CM

## 2011-06-13 DIAGNOSIS — C8290 Follicular lymphoma, unspecified, unspecified site: Secondary | ICD-10-CM

## 2011-06-13 NOTE — Progress Notes (Signed)
This office note has been dictated.

## 2011-06-13 NOTE — Patient Instructions (Signed)
Charles [name]  [id number] [birth year]   [facility]  Discharge Instructions  RECOMMENDATIONS MADE BY THE CONSULTANT AND ANY TEST RESULTS WILL BE SENT TO YOUR REFERRING DOCTOR.   EXAM FINDINGS BY MD TODAY AND SIGNS AND SYMPTOMS TO REPORT TO CLINIC OR PRIMARY MD: you are doing ok.  Need to get you back on schedule for your rituxan.    MEDICATIONS PRESCRIBED: none   INSTRUCTIONS GIVEN AND DISCUSSED: Other :  Report any new lumps, shortness of breath, night sweats, etc.  SPECIAL INSTRUCTIONS/FOLLOW-UP: Return to Clinic:  See schedule.   I acknowledge that I have been informed and understand all the instructions given to me and received a copy. I do not have any more questions at this time, but understand that I may call the Specialty Clinic at [facility] at [phone] during business hours should I have any further questions or need assistance in obtaining follow-up care.    __________________________________________  _____________  __________ Signature of Patient or Authorized Representative            Date                   Time    __________________________________________ Nurse's Signature

## 2011-06-13 NOTE — Progress Notes (Signed)
CC:   Mila Homer. [name], M.D.  DIAGNOSIS: 1. Non-Hodgkin's lymphoma, B-cell type, CD20 positive, stage IVA, grade     3, follicular lymphoma.  Presented with a huge mass in the right     neck at presentation and multiple bones involved on his PET scan.     Status post 6 cycles of bendamustine and Rituxan with a CR by PET     scan criteria, once again confirmed both on 11/11/2010 and most     recently on 06/06/2011. 2. Chronic obstructive pulmonary disease, still smoking approximately     4-5 cigarettes a day, though he smoked 2 packs of cigarettes a day     in the past. 3. Lung nodularity of the right lung base, possibly from infection,     though we need to repeat his CT scan in 3 months to rule out     cancer. 4. Recent bronchitis treated with Levaquin and hydration. 5. Recent dehydration. 6. Degenerative joint disease of the knees. 7. History of congestive heart failure in December 2010, presenting     with atrial fibrillation at that time. 8. Chronic anxiety on low-dose Xanax 4 times a day. 9. Right shoulder replacement surgery in 2010 by Dr. Laroy Apple. 10.Left kneecap removal in 1976.  Eunice's lab work the other day and PET scan really are very reassuring.  He is still in remission, but we did find these nodularity lesions at the right lung base, so we will have to repeat a CT scan in June.  He is feeling great.  We will resume the Rituxan next week.  He will continue followup.  We will get him back here in June, sooner if need be, but I have asked him to quit smoking.  I doubt that he will do that, but we will see what happens.    ______________________________ Ladona Horns. Mariel Sleet, MD ESN/MEDQ  D:  06/13/2011  T:  06/13/2011  Job:  [id number]

## 2011-06-16 ENCOUNTER — Encounter (HOSPITAL_BASED_OUTPATIENT_CLINIC_OR_DEPARTMENT_OTHER): Payer: Medicare Other

## 2011-06-16 ENCOUNTER — Other Ambulatory Visit (HOSPITAL_COMMUNITY): Payer: Self-pay | Admitting: Oncology

## 2011-06-16 VITALS — BP 108/70 | HR 98 | Temp 97.2°F | Wt 207.6 lb

## 2011-06-16 DIAGNOSIS — Z5112 Encounter for antineoplastic immunotherapy: Secondary | ICD-10-CM

## 2011-06-16 DIAGNOSIS — C8290 Follicular lymphoma, unspecified, unspecified site: Secondary | ICD-10-CM

## 2011-06-16 DIAGNOSIS — C8291 Follicular lymphoma, unspecified, lymph nodes of head, face, and neck: Secondary | ICD-10-CM

## 2011-06-16 LAB — COMPREHENSIVE METABOLIC PANEL
ALT: 9 U/L (ref 0–53)
AST: 14 U/L (ref 0–37)
Albumin: 3.5 g/dL (ref 3.5–5.2)
Alkaline Phosphatase: 89 U/L (ref 39–117)
BUN: 13 mg/dL (ref 6–23)
Potassium: 3.8 mEq/L (ref 3.5–5.1)
Sodium: 139 mEq/L (ref 135–145)
Total Protein: 6.8 g/dL (ref 6.0–8.3)

## 2011-06-16 LAB — DIFFERENTIAL
Basophils Absolute: 0 10*3/uL (ref 0.0–0.1)
Basophils Relative: 1 % (ref 0–1)
Eosinophils Absolute: 0.3 10*3/uL (ref 0.0–0.7)
Neutrophils Relative %: 24 % — ABNORMAL LOW (ref 43–77)

## 2011-06-16 LAB — CBC
MCH: 30.2 pg (ref 26.0–34.0)
MCHC: 34.1 g/dL (ref 30.0–36.0)
Platelets: 137 10*3/uL — ABNORMAL LOW (ref 150–400)

## 2011-06-16 LAB — LACTATE DEHYDROGENASE: LDH: 162 U/L (ref 94–250)

## 2011-06-16 MED ORDER — DIPHENHYDRAMINE HCL 25 MG PO CAPS: 50.0000 mg | ORAL_CAPSULE | Freq: Once | ORAL | Status: DC

## 2011-06-16 MED ORDER — HEPARIN SOD (PORK) LOCK FLUSH 100 UNIT/ML IV SOLN
INTRAVENOUS | Status: AC
  Filled 2011-06-16: qty 5

## 2011-06-16 MED ORDER — SODIUM CHLORIDE 0.9 % IJ SOLN
INTRAMUSCULAR | Status: AC
  Filled 2011-06-16: qty 10

## 2011-06-16 MED ORDER — SODIUM CHLORIDE 0.9 % IV SOLN
Freq: Once | INTRAVENOUS | Status: AC
  Administered 2011-06-16: 12:00:00 via INTRAVENOUS

## 2011-06-16 MED ORDER — HEPARIN SOD (PORK) LOCK FLUSH 100 UNIT/ML IV SOLN
500.0000 [IU] | Freq: Once | INTRAVENOUS | Status: AC | PRN
  Administered 2011-06-16: 500 [IU]
  Filled 2011-06-16: qty 5

## 2011-06-16 MED ORDER — ACETAMINOPHEN 325 MG PO TABS: 650.0000 mg | ORAL_TABLET | Freq: Once | ORAL | Status: DC

## 2011-06-16 MED ORDER — SODIUM CHLORIDE 0.9 % IJ SOLN
10.0000 mL | INTRAMUSCULAR | Status: DC | PRN
  Filled 2011-06-16: qty 10

## 2011-06-16 MED ORDER — RITUXIMAB CHEMO INJECTION 10 MG/ML
375.0000 mg/m2 | Freq: Once | INTRAVENOUS | Status: AC
  Administered 2011-06-16: 800 mg via INTRAVENOUS
  Filled 2011-06-16: qty 80

## 2011-06-16 NOTE — Progress Notes (Signed)
C/o sensitivity to cold. Unable to leave home over weekend.. C/o nausea and vomiting after eating at a [employer].

## 2011-07-07 ENCOUNTER — Ambulatory Visit (HOSPITAL_COMMUNITY)
Admission: RE | Admit: 2011-07-07 | Discharge: 2011-07-07 | Disposition: A | Discharge status: Home / Self Care | Payer: Medicare Other | Source: Ambulatory Visit | Attending: Family Medicine | Admitting: Family Medicine

## 2011-07-07 DIAGNOSIS — Z9181 History of falling: Secondary | ICD-10-CM | POA: Insufficient documentation

## 2011-07-07 DIAGNOSIS — R269 Unspecified abnormalities of gait and mobility: Secondary | ICD-10-CM | POA: Insufficient documentation

## 2011-07-07 DIAGNOSIS — IMO0001 Reserved for inherently not codable concepts without codable children: Secondary | ICD-10-CM | POA: Insufficient documentation

## 2011-07-07 DIAGNOSIS — M6281 Muscle weakness (generalized): Secondary | ICD-10-CM | POA: Insufficient documentation

## 2011-07-07 NOTE — Evaluation (Signed)
Physical Therapy Evaluation  Patient Details  Name: Charles [name] MRN: [id number] Date of Birth: 1940/09/03  Today's Date: 07/07/2011 Time: 4098-1191 Time Calculation (min): 40 min  Visit#: 1  of 1   Re-eval:   Assessment Diagnosis: personal history of falling Surgical Date: 07/04/10 Prior Therapy: none  Past Medical History:  Past Medical History  Diagnosis Date  . 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 Hypertension   . Atrial fibrillation 10/2008  . Nonischemic cardiomyopathy 02/2009    Presented with CHF; EF of 25-30%; Nonobstructive ASCVD-worst lesion 40% on coronary angiography  . Peptic ulcer disease   . Basal cell carcinoma   . Degenerative joint disease     Knees and shoulders  . Pneumonia 2010  . Low TSH level     Borderline  . Allergic rhinitis   . Anxiety   . Drug abuse   . Tobacco abuse     50 pack years   Past Surgical History:  Past Surgical History  Procedure Date  . Neck lesion biopsy June 24, 2010    Follicular lymphoma  . Skin cancer excision     under left breast; variously described as melanoma and basal cell  . Rotator cuff repair     right  . Patella fracture surgery 1975  . Hematoma evacuation     after melanoma removal  . Portacath placement 07/12/2010  . Colonoscopy 2009    Also underwent an EGD; patient reports no significant findings    Subjective Symptoms/Limitations Symptoms: Mr. Lamp states that he has fallen at home after his Chemo.  He has fallen outside but he feels this had a lot to do with the chemo and being inactive during the winter.   How long can you sit comfortably?: no problem How long can you walk comfortably?: The patient is able to walk for 15-20 minutes. Pain Assessment Currently in Pain?: No/denies  Precautions/Restrictions  Precautions Precautions: Fall  Prior Functioning  Prior Function Able to Take Stairs?: Yes Driving: Yes Vocation: Retired Gaffer: mow yard Leisure: Hobbies-yes  (Comment) Comments: yard work   Assessment RLE Strength Right Hip Flexion: 5/5 Right Hip Extension: 4/5 Right Hip ABduction: 5/5 Right Hip ADduction: 5/5 Right Knee Flexion: 5/5 Right Knee Extension: 5/5 Right Ankle Dorsiflexion: 4/5 Right Ankle Plantar Flexion: 5/5 LLE Strength Left Hip Flexion: 5/5 Left Hip Extension: 3/5 Left Hip ABduction: 5/5 Left Hip ADduction: 5/5 Left Knee Flexion: 5/5 Left Knee Extension: 3+/5 (Pt L knee cap removed 25 years ago so has residual weakness) Left Ankle Dorsiflexion: 4/5 Left Ankle Plantar Flexion: 5/5  Exercise/Treatments Mobility/Balance  Berg Balance Test Sit to Stand: Able to stand without using hands and stabilize independently Standing Unsupported: Able to stand safely 2 minutes Sitting with Back Unsupported but Feet Supported on Floor or Stool: Able to sit safely and securely 2 minutes Stand to Sit: Sits safely with minimal use of hands Transfers: Able to transfer safely, definite need of hands Standing Unsupported with Eyes Closed: Able to stand 10 seconds safely Standing Ubsupported with Feet Together: Able to place feet together independently and stand 1 minute safely From Standing, Reach Forward with Outstretched Arm: Can reach confidently >25 cm (10") From Standing Position, Pick up Object from Floor: Able to pick up shoe safely and easily From Standing Position, Turn to Look Behind Over each Shoulder: Looks behind one side only/other side shows less weight shift Turn 360 Degrees: Able to turn 360 degrees safely in 4 seconds or less Standing Unsupported, Alternately Place  Feet on Step/Stool: Able to stand independently and safely and complete 8 steps in 20 seconds Standing Unsupported, One Foot in Front: Able to take small step independently and hold 30 seconds Standing on One Leg: Able to lift leg independently and hold 5-10 seconds Total Score: 51  of 54    Standing Other Standing Knee Exercises: SLS x3    Supine Bridges: 10 reps Sidelying   Prone  Hip Extension: Both;10 reps   Physical Therapy Assessment and Plan    HEP  Goals Home Exercise Program Pt will Perform Home Exercise Program: Independently  Problem List Patient Active Problem List  Diagnoses  . THYROID STIMULATING HORMONE, ABNORMAL  . TOBACCO ABUSE  . INSOMNIA, CHRONIC  . HYPERTENSION  . GASTRIC ULCER, HX OF  . Atrial fibrillation  . Chronic anticoagulation  . Follicular lymphoma  . Mixed hyperlipidemia    PT - End of Session Activity Tolerance: Patient tolerated treatment well General Behavior During Session: [facility] for tasks performed Cognition: [facility] for tasks performed  GP  Functional Reporting Modifier  Current Status  G8978 - Mobility: Walking & Moving Around [facility] - 0 percent impaired, limited or restricted  Goal Status  G8979 - Mobility: [place] & Moving Around [facility] - 0 percent impaired, limited or restricted   Margeret Stachnik,CINDY 07/07/2011, 3:25 PM  Physician Documentation Your signature is required to indicate approval of the treatment plan as stated above.  Please sign and either send electronically or make a copy of this report for your files and return this physician signed original.   Please mark one 1.__approve of plan  2. ___approve of plan with the following conditions.   ______________________________                                                          _____________________ Physician Signature                                                                                                             Date

## 2011-07-09 ENCOUNTER — Ambulatory Visit (INDEPENDENT_AMBULATORY_CARE_PROVIDER_SITE_OTHER): Payer: Medicare Other | Admitting: *Deleted

## 2011-07-09 DIAGNOSIS — I4891 Unspecified atrial fibrillation: Secondary | ICD-10-CM

## 2011-07-09 DIAGNOSIS — Z7901 Long term (current) use of anticoagulants: Secondary | ICD-10-CM

## 2011-07-09 LAB — POCT INR: INR: 3.6

## 2011-07-15 ENCOUNTER — Other Ambulatory Visit: Payer: Self-pay | Admitting: *Deleted

## 2011-07-15 DIAGNOSIS — E782 Mixed hyperlipidemia: Secondary | ICD-10-CM

## 2011-07-16 LAB — LIPID PANEL
HDL: 41 mg/dL (ref >39)
LDL Cholesterol: 91 mg/dL (ref 0–99)
Total CHOL/HDL Ratio: 3.7 Ratio
Triglycerides: 102 mg/dL (ref <150)

## 2011-07-28 ENCOUNTER — Encounter (HOSPITAL_COMMUNITY): Payer: Medicare Other | Attending: Oncology

## 2011-07-28 DIAGNOSIS — C8290 Follicular lymphoma, unspecified, unspecified site: Secondary | ICD-10-CM

## 2011-07-28 DIAGNOSIS — C8299 Follicular lymphoma, unspecified, extranodal and solid organ sites: Secondary | ICD-10-CM | POA: Insufficient documentation

## 2011-07-28 DIAGNOSIS — Z452 Encounter for adjustment and management of vascular access device: Secondary | ICD-10-CM

## 2011-07-28 MED ORDER — SODIUM CHLORIDE 0.9 % IJ SOLN
INTRAMUSCULAR | Status: AC
  Filled 2011-07-28: qty 10

## 2011-07-28 MED ORDER — HEPARIN SOD (PORK) LOCK FLUSH 100 UNIT/ML IV SOLN
500.0000 [IU] | Freq: Once | INTRAVENOUS | Status: AC
  Administered 2011-07-28: 500 [IU] via INTRAVENOUS
  Filled 2011-07-28: qty 5

## 2011-07-28 MED ORDER — HEPARIN SOD (PORK) LOCK FLUSH 100 UNIT/ML IV SOLN
INTRAVENOUS | Status: AC
  Filled 2011-07-28: qty 5

## 2011-07-28 MED ORDER — SODIUM CHLORIDE 0.9 % IJ SOLN
10.0000 mL | INTRAMUSCULAR | Status: DC | PRN
  Administered 2011-07-28: 10 mL via INTRAVENOUS
  Filled 2011-07-28: qty 10

## 2011-07-28 NOTE — Progress Notes (Signed)
[name] presented for Portacath access and flush. Proper placement of portacath confirmed by CXR. Portacath located left chest wall accessed with  H 20 needle. Good blood return present. Portacath flushed with 20ml NS and 500U/64ml Heparin and needle removed intact. Procedure without incident. Patient tolerated procedure well.

## 2011-07-30 ENCOUNTER — Ambulatory Visit (INDEPENDENT_AMBULATORY_CARE_PROVIDER_SITE_OTHER): Payer: Medicare Other | Admitting: *Deleted

## 2011-07-30 DIAGNOSIS — Z7901 Long term (current) use of anticoagulants: Secondary | ICD-10-CM

## 2011-07-30 DIAGNOSIS — I4891 Unspecified atrial fibrillation: Secondary | ICD-10-CM

## 2011-08-13 ENCOUNTER — Ambulatory Visit (INDEPENDENT_AMBULATORY_CARE_PROVIDER_SITE_OTHER): Payer: Medicare Other | Admitting: *Deleted

## 2011-08-13 DIAGNOSIS — Z7901 Long term (current) use of anticoagulants: Secondary | ICD-10-CM

## 2011-08-13 DIAGNOSIS — I4891 Unspecified atrial fibrillation: Secondary | ICD-10-CM

## 2011-09-01 ENCOUNTER — Encounter (HOSPITAL_COMMUNITY): Payer: Medicare Other | Attending: Oncology

## 2011-09-01 DIAGNOSIS — C8290 Follicular lymphoma, unspecified, unspecified site: Secondary | ICD-10-CM

## 2011-09-01 DIAGNOSIS — C8299 Follicular lymphoma, unspecified, extranodal and solid organ sites: Secondary | ICD-10-CM | POA: Insufficient documentation

## 2011-09-01 LAB — DIFFERENTIAL
Eosinophils Absolute: 0.4 10*3/uL (ref 0.0–0.7)
Eosinophils Relative: 24 % — ABNORMAL HIGH (ref 0–5)
Lymphs Abs: 0.5 10*3/uL — ABNORMAL LOW (ref 0.7–4.0)
Monocytes Relative: 23 % — ABNORMAL HIGH (ref 3–12)

## 2011-09-01 LAB — CBC
MCH: 30 pg (ref 26.0–34.0)
MCV: 90.8 fL (ref 78.0–100.0)
Platelets: 162 10*3/uL (ref 150–400)
RBC: 3.9 MIL/uL — ABNORMAL LOW (ref 4.22–5.81)

## 2011-09-01 LAB — COMPREHENSIVE METABOLIC PANEL
BUN: 15 mg/dL (ref 6–23)
Calcium: 9.6 mg/dL (ref 8.4–10.5)
GFR calc Af Amer: >90 mL/min (ref >90)
Glucose, Bld: 100 mg/dL — ABNORMAL HIGH (ref 70–99)
Sodium: 143 mEq/L (ref 135–145)
Total Protein: 6.9 g/dL (ref 6.0–8.3)

## 2011-09-01 LAB — LACTATE DEHYDROGENASE: LDH: 148 U/L (ref 94–250)

## 2011-09-03 ENCOUNTER — Ambulatory Visit (INDEPENDENT_AMBULATORY_CARE_PROVIDER_SITE_OTHER): Payer: Medicare Other | Admitting: *Deleted

## 2011-09-03 DIAGNOSIS — Z7901 Long term (current) use of anticoagulants: Secondary | ICD-10-CM

## 2011-09-03 DIAGNOSIS — I4891 Unspecified atrial fibrillation: Secondary | ICD-10-CM

## 2011-09-03 LAB — POCT INR: INR: 1.4

## 2011-09-08 ENCOUNTER — Other Ambulatory Visit (HOSPITAL_COMMUNITY): Payer: Medicare Other

## 2011-09-08 ENCOUNTER — Ambulatory Visit (HOSPITAL_COMMUNITY)
Admission: RE | Admit: 2011-09-08 | Discharge: 2011-09-08 | Disposition: A | Discharge status: Home / Self Care | Payer: Medicare Other | Source: Ambulatory Visit | Attending: Oncology | Admitting: Oncology

## 2011-09-08 ENCOUNTER — Ambulatory Visit (HOSPITAL_COMMUNITY): Payer: Medicare Other

## 2011-09-08 DIAGNOSIS — R911 Solitary pulmonary nodule: Secondary | ICD-10-CM | POA: Insufficient documentation

## 2011-09-08 DIAGNOSIS — Z87898 Personal history of other specified conditions: Secondary | ICD-10-CM | POA: Insufficient documentation

## 2011-09-08 DIAGNOSIS — C8290 Follicular lymphoma, unspecified, unspecified site: Secondary | ICD-10-CM

## 2011-09-08 DIAGNOSIS — F17200 Nicotine dependence, unspecified, uncomplicated: Secondary | ICD-10-CM | POA: Insufficient documentation

## 2011-09-09 ENCOUNTER — Encounter (HOSPITAL_BASED_OUTPATIENT_CLINIC_OR_DEPARTMENT_OTHER): Payer: Medicare Other

## 2011-09-09 ENCOUNTER — Encounter (HOSPITAL_BASED_OUTPATIENT_CLINIC_OR_DEPARTMENT_OTHER): Payer: Medicare Other | Admitting: Oncology

## 2011-09-09 VITALS — BP 109/56 | HR 74 | Temp 97.7°F | Wt 211.8 lb

## 2011-09-09 DIAGNOSIS — C8299 Follicular lymphoma, unspecified, extranodal and solid organ sites: Secondary | ICD-10-CM

## 2011-09-09 DIAGNOSIS — C8290 Follicular lymphoma, unspecified, unspecified site: Secondary | ICD-10-CM

## 2011-09-09 DIAGNOSIS — J449 Chronic obstructive pulmonary disease, unspecified: Secondary | ICD-10-CM

## 2011-09-09 LAB — COMPREHENSIVE METABOLIC PANEL
ALT: 11 U/L (ref 0–53)
AST: 17 U/L (ref 0–37)
Alkaline Phosphatase: 95 U/L (ref 39–117)
CO2: 25 mEq/L (ref 19–32)
GFR calc Af Amer: >90 mL/min (ref >90)
Glucose, Bld: 96 mg/dL (ref 70–99)
Potassium: 4.3 mEq/L (ref 3.5–5.1)
Sodium: 140 mEq/L (ref 135–145)
Total Protein: 6.5 g/dL (ref 6.0–8.3)

## 2011-09-09 LAB — DIFFERENTIAL
Eosinophils Absolute: 0.4 10*3/uL (ref 0.0–0.7)
Lymphocytes Relative: 29 % (ref 12–46)
Lymphs Abs: 0.4 10*3/uL — ABNORMAL LOW (ref 0.7–4.0)
Neutrophils Relative %: 20 % — ABNORMAL LOW (ref 43–77)

## 2011-09-09 LAB — CBC
Platelets: 133 10*3/uL — ABNORMAL LOW (ref 150–400)
RBC: 3.58 MIL/uL — ABNORMAL LOW (ref 4.22–5.81)
WBC: 1.4 10*3/uL — CL (ref 4.0–10.5)

## 2011-09-09 MED ORDER — SODIUM CHLORIDE 0.9 % IV SOLN
375.0000 mg/m2 | Freq: Once | INTRAVENOUS | Status: AC
  Administered 2011-09-09: 800 mg via INTRAVENOUS
  Filled 2011-09-09: qty 80

## 2011-09-09 MED ORDER — HEPARIN SOD (PORK) LOCK FLUSH 100 UNIT/ML IV SOLN
INTRAVENOUS | Status: AC
  Filled 2011-09-09: qty 5

## 2011-09-09 MED ORDER — SODIUM CHLORIDE 0.9 % IV SOLN
Freq: Once | INTRAVENOUS | Status: AC
  Administered 2011-09-09: 10:00:00 via INTRAVENOUS

## 2011-09-09 MED ORDER — HEPARIN SOD (PORK) LOCK FLUSH 100 UNIT/ML IV SOLN
500.0000 [IU] | Freq: Once | INTRAVENOUS | Status: AC | PRN
  Administered 2011-09-09: 500 [IU]
  Filled 2011-09-09: qty 5

## 2011-09-09 NOTE — Progress Notes (Signed)
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 He is status post 6 cycles of bendamustine and Rituxan with a PET scan showing a CR after 3 cycles of chemotherapy.  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 His CT scan also did not show any enlargement of lymph nodes at the base of the neck axilla or in the chest.  Problem #3 history of CHF in December 2010 presented with atrial fibrillation at that time.  Problem #4 chronic anxiety and low-dose Xanax 4 times a day  Charles [name] is here today for maintenance Rituxan and he is doing very very well at this time. He does not have any B. symptoms and again his CT scan of his chest showed resolution of these inflammatory nodules but also no adenopathy or enlargement of lymph nodes in the multiple areas that it would also scan. He has labs which show mild anemia but also leukopenia most likely secondary to his chemotherapy. He has had no severe infection so we will continue with this maintenance regimen in the hopes we can keep him in remission. I will see him in August. His wife is with him today and we went over a number of questions that they had. He is going to the beach this coming weekend and should do well there.

## 2011-09-09 NOTE — Progress Notes (Signed)
Pt took 2 tylenol pm this am at 0730 Tolerated well

## 2011-09-10 ENCOUNTER — Ambulatory Visit (HOSPITAL_COMMUNITY): Payer: Medicare Other | Admitting: Oncology

## 2011-09-24 ENCOUNTER — Ambulatory Visit (INDEPENDENT_AMBULATORY_CARE_PROVIDER_SITE_OTHER): Payer: Medicare Other | Admitting: *Deleted

## 2011-09-24 DIAGNOSIS — Z7901 Long term (current) use of anticoagulants: Secondary | ICD-10-CM

## 2011-09-24 DIAGNOSIS — I4891 Unspecified atrial fibrillation: Secondary | ICD-10-CM

## 2011-09-24 LAB — POCT INR: INR: 2.9

## 2011-10-14 ENCOUNTER — Ambulatory Visit: Payer: Medicare Other | Admitting: Cardiology

## 2011-10-15 ENCOUNTER — Ambulatory Visit (INDEPENDENT_AMBULATORY_CARE_PROVIDER_SITE_OTHER): Payer: Medicare Other | Admitting: Adult Health

## 2011-10-15 ENCOUNTER — Encounter: Payer: Self-pay | Admitting: Adult Health

## 2011-10-15 ENCOUNTER — Ambulatory Visit (INDEPENDENT_AMBULATORY_CARE_PROVIDER_SITE_OTHER): Payer: Medicare Other | Admitting: *Deleted

## 2011-10-15 VITALS — BP 120/70 | HR 68 | Ht 67.0 in | Wt 211.0 lb

## 2011-10-15 DIAGNOSIS — I4891 Unspecified atrial fibrillation: Secondary | ICD-10-CM

## 2011-10-15 DIAGNOSIS — I10 Essential (primary) hypertension: Secondary | ICD-10-CM

## 2011-10-15 DIAGNOSIS — Z7901 Long term (current) use of anticoagulants: Secondary | ICD-10-CM

## 2011-10-15 LAB — POCT INR: INR: 2.9

## 2011-10-15 NOTE — Patient Instructions (Addendum)
Your physician recommends that you continue on your current medications as directed. Please refer to the Current Medication list given to you today.  Your physician wants you to follow-up in: 6 months. You will receive a reminder letter in the mail two months in advance. If you don't receive a letter, please call our office to schedule the follow-up appointment.  

## 2011-10-15 NOTE — Assessment & Plan Note (Signed)
Rate is well controlled. He is medically complaint. INR was checked today while he was in the exam room. He will continue current medications and follow-up coumadin clinic appointments. He is encouraged to continue swimming and exercising as this is making him feel better and will help with weight loss.

## 2011-10-15 NOTE — Progress Notes (Signed)
HPI: Charles [name] is a pleasant and talkative 71 y/o patient of Dr. Dietrich Pates we are seeing for ongoing assessment and treatment of permanent atrial fib on coumadin (followed in our [place] office), hypertension, nonobstructive CAD, hypercholesterolemia, with history of follicular lymphoma and right neck mass s/p chemo. He comes today without any complaints. In fact is states that he is feeling better than he has in months. He has started swimming daily and now has a membership at [employer] do continue this. He has also recently adopted a black cat which he states "changed my life!" He states that the cat has calmed him and given him something to take care of and spend time with.   Allergies  Allergen Reactions  . Statins     Intolerant secondary to severe myalgias    Current Outpatient Prescriptions  Medication Sig Dispense Refill  . ALPRAZolam (XANAX) 1 MG tablet Take 0.5 mg by mouth 4 (four) times daily.       . carvedilol (COREG) 25 MG tablet TAKE ONE TABLET TWICE DAILY  60 tablet  10  . diltiazem (CARDIZEM CD) 240 MG 24 hr capsule Take 1 capsule (240 mg total) by mouth daily.  30 capsule  11  . fish oil-omega-3 fatty acids 1000 MG capsule Take 1,500 mg by mouth daily.      . furosemide (LASIX) 40 MG tablet Take 20 mg by mouth every other day.        . Hydrocodone-APAP-Dietary Prod 7.5-750 MG MISC Take 7.5 mg by mouth 4 (four) times daily as needed. 1/2 tablet qid      . HYDROcodone-homatropine (HYCODAN) 5-1.5 MG/5ML syrup Take 5 mLs by mouth every 4 (four) hours as needed.      Marland Kitchen lisinopril (PRINIVIL,ZESTRIL) 10 MG tablet Take 1 tablet (10 mg total) by mouth daily.  30 tablet  3  . lovastatin (MEVACOR) 10 MG tablet Take 10 mg by mouth at bedtime.      . niacin (NIASPAN) 500 MG CR tablet Take 1 tablet (500 mg total) by mouth at bedtime.  30 tablet  6  . Nutritional Supplements (ENSURE PO) Take by mouth.        Marland Kitchen omeprazole (PRILOSEC) 20 MG capsule TAKE ONE (1) CAPSULE EACH DAY  30  capsule  6  . traMADol (ULTRAM) 50 MG tablet Take 100 mg by mouth 4 (four) times daily.        Marland Kitchen warfarin (COUMADIN) 5 MG tablet Take 1 tablet (5 mg total) by mouth as directed.  60 tablet  2  . zolpidem (AMBIEN) 10 MG tablet Take 10 mg by mouth at bedtime as needed.        Marland Kitchen levofloxacin (LEVAQUIN) 500 MG tablet Take 500 mg by mouth daily.      Marland Kitchen LORazepam (ATIVAN) 1 MG tablet Take 1 mg by mouth as needed.         No current facility-administered medications for this visit.   Facility-Administered Medications Ordered in Other Visits  Medication Dose Route Frequency Provider Last Rate Last Dose  . sodium chloride 0.9 % injection 10 mL  10 mL Intracatheter PRN Randall An, MD   10 mL at 10/15/10 1230    Past Medical History  Diagnosis Date  . 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 Hypertension   . Atrial fibrillation 10/2008  . Nonischemic cardiomyopathy 02/2009    Presented with CHF; EF of 25-30%; Nonobstructive ASCVD-worst lesion 40% on coronary angiography  . Peptic ulcer disease   .  Basal cell carcinoma   . Degenerative joint disease     Knees and shoulders  . Pneumonia 2010  . Low TSH level     Borderline  . Allergic rhinitis   . Anxiety   . Drug abuse   . Tobacco abuse     50 pack years    Past Surgical History  Procedure Date  . Neck lesion biopsy June 24, 2010    Follicular lymphoma  . Skin cancer excision     under left breast; variously described as melanoma and basal cell  . Rotator cuff repair     right  . Patella fracture surgery 1975  . Hematoma evacuation     after melanoma removal  . Portacath placement 07/12/2010  . Colonoscopy 2009    Also underwent an EGD; patient reports no significant findings    BMW:UXLKGM of systems complete and found to be negative unless listed above  PHYSICAL EXAM BP 120/70  Pulse 68  Ht 5\' 7"  (1.702 m)  Wt 211 lb (95.709 kg)  BMI 33.05 kg/m2  General: Well developed, well nourished, in no acute distress Head: Eyes  PERRLA, No xanthomas.   Normal cephalic and atramatic  Lungs: Clear bilaterally to auscultation and percussion. Heart: HRIR , without MRG.  Pulses are 2+ & equal.            No carotid bruit. No JVD.  No abdominal bruits. No femoral bruits. Abdomen: Bowel sounds are positive, abdomen soft and non-tender without masses or                  Hernia's noted. Msk:  Back normal, normal gait. Normal strength and tone for age. Extremities: No clubbing, cyanosis or edema.  DP +1 Neuro: Alert and oriented X 3. Psych:  Good affect, responds appropriately  EKG: Atrial fibrillation rate of 68 bpm.  ASSESSMENT AND PLAN

## 2011-10-15 NOTE — Assessment & Plan Note (Signed)
Excellent control of BP. No changes at this time.  

## 2011-10-21 ENCOUNTER — Encounter (HOSPITAL_COMMUNITY): Payer: Medicare Other | Attending: Oncology

## 2011-10-21 DIAGNOSIS — C8299 Follicular lymphoma, unspecified, extranodal and solid organ sites: Secondary | ICD-10-CM

## 2011-10-21 DIAGNOSIS — Z95828 Presence of other vascular implants and grafts: Secondary | ICD-10-CM

## 2011-10-21 DIAGNOSIS — Z452 Encounter for adjustment and management of vascular access device: Secondary | ICD-10-CM

## 2011-10-21 DIAGNOSIS — Z9889 Other specified postprocedural states: Secondary | ICD-10-CM | POA: Insufficient documentation

## 2011-10-21 MED ORDER — HEPARIN SOD (PORK) LOCK FLUSH 100 UNIT/ML IV SOLN
500.0000 [IU] | Freq: Once | INTRAVENOUS | Status: AC
  Administered 2011-10-21: 500 [IU] via INTRAVENOUS
  Filled 2011-10-21: qty 5

## 2011-10-21 MED ORDER — HEPARIN SOD (PORK) LOCK FLUSH 100 UNIT/ML IV SOLN
INTRAVENOUS | Status: AC
  Filled 2011-10-21: qty 5

## 2011-10-21 MED ORDER — SODIUM CHLORIDE 0.9 % IJ SOLN
10.0000 mL | INTRAMUSCULAR | Status: DC | PRN
  Administered 2011-10-21: 10 mL via INTRAVENOUS
  Filled 2011-10-21: qty 10

## 2011-10-21 NOTE — Progress Notes (Signed)
[name] presented for Portacath access and flush. Proper placement of portacath confirmed by CXR. Portacath located lt chest wall accessed with  H 20 needle. Portacath flushed with 20ml NS and 500U/99ml Heparin and needle removed intact. Procedure without incident. Patient tolerated procedure well.

## 2011-11-12 ENCOUNTER — Ambulatory Visit (INDEPENDENT_AMBULATORY_CARE_PROVIDER_SITE_OTHER): Payer: Medicare Other | Admitting: *Deleted

## 2011-11-12 DIAGNOSIS — Z7901 Long term (current) use of anticoagulants: Secondary | ICD-10-CM

## 2011-11-12 DIAGNOSIS — I4891 Unspecified atrial fibrillation: Secondary | ICD-10-CM

## 2011-11-12 LAB — POCT INR: INR: 2.5

## 2011-11-13 ENCOUNTER — Encounter (HOSPITAL_COMMUNITY): Payer: Medicare Other | Attending: Oncology

## 2011-11-13 DIAGNOSIS — C8290 Follicular lymphoma, unspecified, unspecified site: Secondary | ICD-10-CM

## 2011-11-13 DIAGNOSIS — C8299 Follicular lymphoma, unspecified, extranodal and solid organ sites: Secondary | ICD-10-CM

## 2011-11-13 LAB — DIFFERENTIAL
Basophils Relative: 2 % — ABNORMAL HIGH (ref 0–1)
Lymphocytes Relative: 33 % (ref 12–46)
Lymphs Abs: 0.6 10*3/uL — ABNORMAL LOW (ref 0.7–4.0)
Monocytes Absolute: 0.4 10*3/uL (ref 0.1–1.0)
Monocytes Relative: 22 % — ABNORMAL HIGH (ref 3–12)
Neutro Abs: 0.2 10*3/uL — ABNORMAL LOW (ref 1.7–7.7)
Neutrophils Relative %: 14 % — ABNORMAL LOW (ref 43–77)

## 2011-11-13 LAB — COMPREHENSIVE METABOLIC PANEL
Albumin: 3.9 g/dL (ref 3.5–5.2)
Alkaline Phosphatase: 107 U/L (ref 39–117)
BUN: 18 mg/dL (ref 6–23)
CO2: 28 mEq/L (ref 19–32)
Chloride: 103 mEq/L (ref 96–112)
GFR calc non Af Amer: 70 mL/min — ABNORMAL LOW (ref >90)
Glucose, Bld: 113 mg/dL — ABNORMAL HIGH (ref 70–99)
Potassium: 4.1 mEq/L (ref 3.5–5.1)
Total Bilirubin: 0.3 mg/dL (ref 0.3–1.2)

## 2011-11-13 LAB — CBC
HCT: 36.4 % — ABNORMAL LOW (ref 39.0–52.0)
Hemoglobin: 12.2 g/dL — ABNORMAL LOW (ref 13.0–17.0)
RBC: 4.05 MIL/uL — ABNORMAL LOW (ref 4.22–5.81)
WBC: 1.7 10*3/uL — ABNORMAL LOW (ref 4.0–10.5)

## 2011-11-13 LAB — LACTATE DEHYDROGENASE: LDH: 164 U/L (ref 94–250)

## 2011-11-13 NOTE — Progress Notes (Signed)
Labs drawn today for cbc/diff,cmp,ldh 

## 2011-11-14 ENCOUNTER — Encounter (HOSPITAL_BASED_OUTPATIENT_CLINIC_OR_DEPARTMENT_OTHER): Payer: Medicare Other | Admitting: Oncology

## 2011-11-14 VITALS — BP 123/79 | HR 97 | Temp 97.8°F | Resp 18 | Wt 211.0 lb

## 2011-11-14 DIAGNOSIS — J449 Chronic obstructive pulmonary disease, unspecified: Secondary | ICD-10-CM

## 2011-11-14 DIAGNOSIS — C8291 Follicular lymphoma, unspecified, lymph nodes of head, face, and neck: Secondary | ICD-10-CM

## 2011-11-14 DIAGNOSIS — F411 Generalized anxiety disorder: Secondary | ICD-10-CM

## 2011-11-14 DIAGNOSIS — C8290 Follicular lymphoma, unspecified, unspecified site: Secondary | ICD-10-CM

## 2011-11-14 DIAGNOSIS — I509 Heart failure, unspecified: Secondary | ICD-10-CM

## 2011-11-14 NOTE — Patient Instructions (Addendum)
MARQUETTE BLODGETT  DOB Mar 05, 1941 CSN [id number]  MRN [id number] Dr. Glenford Peers   [facility]  Discharge Instructions  RECOMMENDATIONS MADE BY THE CONSULTANT AND ANY TEST RESULTS WILL BE SENT TO YOUR REFERRING DOCTOR.   EXAM FINDINGS BY MD TODAY AND SIGNS AND SYMPTOMS TO REPORT TO CLINIC OR PRIMARY MD: Exam and discussion per MD.  You are doing well.  No evidence of recurrence by exam.  MEDICATIONS PRESCRIBED: none  INSTRUCTIONS GIVEN AND DISCUSSED: Other :  Report any recurring infections, fevers, night sweats, any new lumps or shortness of breath.  SPECIAL INSTRUCTIONS/FOLLOW-UP: Lab work Needed every 3 months with chemotherapy and Return to Clinic in 3 months to see PA.   I acknowledge that I have been informed and understand all the instructions given to me and received a copy. I do not have any more questions at this time, but understand that I may call the Specialty Clinic at [facility] at [phone] during business hours should I have any further questions or need assistance in obtaining follow-up care.    __________________________________________  _____________  __________ Signature of Patient or Authorized Representative            Date                   Time    __________________________________________ Nurse's Signature

## 2011-11-14 NOTE — Progress Notes (Signed)
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 He is without B. symptomatology. Presently he feels very well. He was treated with 6 cycles of bendamustine and Rituxan with a CR on PET scan after 3 cycles. Unfortunately he still smokes 3-4 cigarettes a day due to his high anxiety level. He still uses the Xanax 4 times a day for this.  He is on maintenance Rituxan with no complications of infections or need for antibiotics. I do not have him on acyclovir  or Bactrim and I think we will continue to just watch him.  Problem #2 COPD still smoking Problem #3 CHF in December 2010 presented with atrial fibrillation at that time Problem #4 chronic anxiety on Xanax 4 times a day  He looks great vital signs are stable he is in no acute distress he does have a small red area about 4-5 mm across the top of his head where he hit his head but I think it is just that a traumatic lesion. He has a seborrheic keratosis left temporal area as well which occasionally itches he states. His abdomen is soft liver is palpable 5-7 cm below the right costal margin but there is no splenomegaly and he has no lymphadenopathy in any location including cervical, supraclavicular, infraclavicular, axillary, epitrochlear areas or inguinal areas. He has no arm or leg edema. Port-A-Cath is intact in left upper chest wall. Heart right now shows a slightly irregular rhythm but regular rate no distinct S3 gallop is felt.  We will finish maintenance Rituxan. He has had 3 cycles of a planned 8. His next cycle is due in September. His labs are stable.

## 2011-11-30 ENCOUNTER — Encounter (HOSPITAL_COMMUNITY): Payer: Self-pay

## 2011-11-30 ENCOUNTER — Emergency Department (HOSPITAL_COMMUNITY)
Admission: EM | Admit: 2011-11-30 | Discharge: 2011-11-30 | Disposition: A | Discharge status: Home / Self Care | Payer: Medicare Other | Attending: Emergency Medicine | Admitting: Emergency Medicine

## 2011-11-30 DIAGNOSIS — L299 Pruritus, unspecified: Secondary | ICD-10-CM | POA: Insufficient documentation

## 2011-11-30 DIAGNOSIS — F17200 Nicotine dependence, unspecified, uncomplicated: Secondary | ICD-10-CM | POA: Insufficient documentation

## 2011-11-30 DIAGNOSIS — Z7901 Long term (current) use of anticoagulants: Secondary | ICD-10-CM | POA: Insufficient documentation

## 2011-11-30 DIAGNOSIS — Z79899 Other long term (current) drug therapy: Secondary | ICD-10-CM | POA: Insufficient documentation

## 2011-11-30 DIAGNOSIS — M1990 Unspecified osteoarthritis, unspecified site: Secondary | ICD-10-CM | POA: Insufficient documentation

## 2011-11-30 DIAGNOSIS — I4891 Unspecified atrial fibrillation: Secondary | ICD-10-CM | POA: Insufficient documentation

## 2011-11-30 DIAGNOSIS — I10 Essential (primary) hypertension: Secondary | ICD-10-CM | POA: Insufficient documentation

## 2011-11-30 MED ORDER — FENTANYL CITRATE 0.05 MG/ML IJ SOLN
75.0000 ug | Freq: Once | INTRAMUSCULAR | Status: AC
  Administered 2011-11-30: 75 ug via INTRAMUSCULAR
  Filled 2011-11-30: qty 2

## 2011-11-30 MED ORDER — OXYCODONE-ACETAMINOPHEN 5-325 MG PO TABS: 1.0000 | ORAL_TABLET | Freq: Four times a day (QID) | ORAL | Status: AC | PRN

## 2011-11-30 MED ORDER — HYDROXYZINE HCL 25 MG PO TABS: 25.0000 mg | ORAL_TABLET | Freq: Four times a day (QID) | ORAL | Status: AC | PRN

## 2011-11-30 MED ORDER — DIPHENHYDRAMINE HCL 25 MG PO CAPS
50.0000 mg | ORAL_CAPSULE | Freq: Once | ORAL | Status: AC
  Administered 2011-11-30: 50 mg via ORAL
  Filled 2011-11-30: qty 2

## 2011-11-30 NOTE — ED Notes (Signed)
Pt reports that his pain medication was changed a month ago.  Pt reports itching all over his body when he took the new medication.  Pt now reports rash and itching all over his body.

## 2011-11-30 NOTE — ED Provider Notes (Signed)
History    71 year old male with pruritus. Onset about a month ago. Only new exposure is changed the preparation of his pain medication. Itching is relatively constant. Patient also reports a rash with the last several days. No nausea or vomiting. No wheezing. No diarrhea. No chest pain or shortness of breath. No contacts with similar symptoms. CSN: [id number]  Arrival date & time 11/30/11  1526   First MD Initiated Contact with Patient 11/30/11 1555      Chief Complaint  Patient presents with  . Rash  . Pruritis    (Consider location/radiation/quality/duration/timing/severity/associated sxs/prior treatment) HPI  Past Medical History  Diagnosis Date  . 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 Hypertension   . Atrial fibrillation 10/2008  . Nonischemic cardiomyopathy 02/2009    Presented with CHF; EF of 25-30%; Nonobstructive ASCVD-worst lesion 40% on coronary angiography  . Peptic ulcer disease   . Basal cell carcinoma   . Degenerative joint disease     Knees and shoulders  . Pneumonia 2010  . Low TSH level     Borderline  . Allergic rhinitis   . Anxiety   . Drug abuse   . Tobacco abuse     50 pack years    Past Surgical History  Procedure Date  . Neck lesion biopsy June 24, 2010    Follicular lymphoma  . Skin cancer excision     under left breast; variously described as melanoma and basal cell  . Rotator cuff repair     right  . Patella fracture surgery 1975  . Hematoma evacuation     after melanoma removal  . Portacath placement 07/12/2010  . Colonoscopy 2009    Also underwent an EGD; patient reports no significant findings    Family History  Problem Relation Age of Onset  . Heart attack Father   . Coronary artery disease Other     History  Substance Use Topics  . Smoking status: Current Everyday Smoker -- 1.0 packs/day for 50 years    Types: Cigarettes  . Smokeless tobacco: Not on file   Comment: Recent consumption of 1/5 pack a day  . Alcohol Use: No       Review of Systems   Review of symptoms negative unless otherwise noted in HPI.   Allergies  Statins  Home Medications   Current Outpatient Rx  Name Route Sig Dispense Refill  . ALPRAZOLAM 1 MG PO TABS Oral Take 0.5 mg by mouth 4 (four) times daily.     Marland Kitchen CARVEDILOL 25 MG PO TABS  TAKE ONE TABLET TWICE DAILY 60 tablet 10  . DILTIAZEM HCL ER COATED BEADS 240 MG PO CP24 Oral Take 1 capsule (240 mg total) by mouth daily. 30 capsule 11    Dose increase  . OMEGA-3 FATTY ACIDS 1000 MG PO CAPS Oral Take 1,500 mg by mouth daily.    . FUROSEMIDE 40 MG PO TABS Oral Take 20 mg by mouth every other day.      Marland Kitchen HYDROCODONE-ACETAMINOPHEN 7.5-650 MG PO TABS Oral Take 0.5 tablets by mouth every 8 (eight) hours as needed.     Marland Kitchen LISINOPRIL 10 MG PO TABS Oral Take 1 tablet (10 mg total) by mouth daily. 30 tablet 3  . LORAZEPAM 1 MG PO TABS Oral Take 1 mg by mouth as needed.      Marland Kitchen LOVASTATIN 10 MG PO TABS Oral Take 10 mg by mouth at bedtime.    Marland Kitchen NIACIN ER (ANTIHYPERLIPIDEMIC) 500 MG PO TBCR Oral Take  1 tablet (500 mg total) by mouth at bedtime. 30 tablet 6  . ENSURE PO Oral Take by mouth.      . OMEPRAZOLE 20 MG PO CPDR  TAKE ONE (1) CAPSULE EACH DAY 30 capsule 6  . TRAMADOL HCL 50 MG PO TABS Oral Take 100 mg by mouth 4 (four) times daily.      . WARFARIN SODIUM 5 MG PO TABS Oral Take 5 mg by mouth as directed. 5mg  daily except 2.5 mg 1 day out of 7    . ZOLPIDEM TARTRATE 10 MG PO TABS Oral Take 10 mg by mouth at bedtime as needed.        BP 121/73  Pulse 91  Temp 97.7 F (36.5 C) (Oral)  Resp 18  Ht 5\' 7"  (1.702 m)  Wt 208 lb (94.348 kg)  BMI 32.58 kg/m2  SpO2 100%  Physical Exam  Nursing note and vitals reviewed. Constitutional: He appears well-developed and well-nourished. No distress.  HENT:  Head: Normocephalic and atraumatic.  Eyes: Conjunctivae normal are normal. Right eye exhibits no discharge. Left eye exhibits no discharge.  Neck: Neck supple.  Cardiovascular: Normal  rate, regular rhythm and normal heart sounds.  Exam reveals no gallop and no friction rub.   No murmur heard. Pulmonary/Chest: Effort normal and breath sounds normal. No stridor. No respiratory distress. He has no wheezes.  Abdominal: Soft. He exhibits no distension. There is no tenderness.  Musculoskeletal: He exhibits no edema and no tenderness.  Neurological: He is alert.  Skin: Skin is warm and dry. No rash noted.       2 small areas of excoriation to anterior chest but no rash appreciated  Psychiatric: He has a normal mood and affect. His behavior is normal. Thought content normal.    ED Course  Procedures (including critical care time)  Labs Reviewed - No data to display No results found.   1. Pruritus       MDM  71yM with generalized pruritis. Only new exposure pt is aware of is changing preparations of hydrocodone. Possibly getting larger histamine release with this preparation? Pt says he cannot go without taking so prescription for alternative prescribed. Taking niacin as well but says has been taking for awhile with no prior issues. No evidence of respiratory distress. Atarax for symptomatic tx. Return precautions discussed. Outpt fu otherwise.        Raeford Razor, MD 12/04/11 [phone]

## 2011-12-09 ENCOUNTER — Encounter (HOSPITAL_COMMUNITY): Payer: Medicare Other | Attending: Oncology

## 2011-12-09 ENCOUNTER — Telehealth (HOSPITAL_COMMUNITY): Payer: Self-pay

## 2011-12-09 VITALS — BP 114/73 | HR 91 | Temp 97.5°F | Resp 18 | Wt 212.0 lb

## 2011-12-09 DIAGNOSIS — Z5111 Encounter for antineoplastic chemotherapy: Secondary | ICD-10-CM

## 2011-12-09 DIAGNOSIS — C8299 Follicular lymphoma, unspecified, extranodal and solid organ sites: Secondary | ICD-10-CM | POA: Insufficient documentation

## 2011-12-09 DIAGNOSIS — C8290 Follicular lymphoma, unspecified, unspecified site: Secondary | ICD-10-CM

## 2011-12-09 LAB — CBC WITH DIFFERENTIAL/PLATELET
Basophils Absolute: 0 10*3/uL (ref 0.0–0.1)
Basophils Relative: 3 % — ABNORMAL HIGH (ref 0–1)
Eosinophils Absolute: 0.4 10*3/uL (ref 0.0–0.7)
Hemoglobin: 12.1 g/dL — ABNORMAL LOW (ref 13.0–17.0)
MCH: 30.8 pg (ref 26.0–34.0)
MCHC: 34.5 g/dL (ref 30.0–36.0)
Neutro Abs: 0.3 10*3/uL — ABNORMAL LOW (ref 1.7–7.7)
Neutrophils Relative %: 21 % — ABNORMAL LOW (ref 43–77)
Platelets: 139 10*3/uL — ABNORMAL LOW (ref 150–400)
RBC: 3.93 MIL/uL — ABNORMAL LOW (ref 4.22–5.81)

## 2011-12-09 LAB — COMPREHENSIVE METABOLIC PANEL
ALT: 14 U/L (ref 0–53)
AST: 17 U/L (ref 0–37)
Albumin: 3.4 g/dL — ABNORMAL LOW (ref 3.5–5.2)
Alkaline Phosphatase: 110 U/L (ref 39–117)
Chloride: 106 mEq/L (ref 96–112)
Potassium: 3.9 mEq/L (ref 3.5–5.1)
Sodium: 138 mEq/L (ref 135–145)
Total Bilirubin: 0.3 mg/dL (ref 0.3–1.2)
Total Protein: 6.4 g/dL (ref 6.0–8.3)

## 2011-12-09 LAB — LACTATE DEHYDROGENASE: LDH: 170 U/L (ref 94–250)

## 2011-12-09 MED ORDER — SODIUM CHLORIDE 0.9 % IV SOLN
Freq: Once | INTRAVENOUS | Status: AC
  Administered 2011-12-09: 10:00:00 via INTRAVENOUS

## 2011-12-09 MED ORDER — HEPARIN SOD (PORK) LOCK FLUSH 100 UNIT/ML IV SOLN
INTRAVENOUS | Status: AC
  Filled 2011-12-09: qty 5

## 2011-12-09 MED ORDER — DIPHENHYDRAMINE HCL 25 MG PO CAPS
50.0000 mg | ORAL_CAPSULE | Freq: Once | ORAL | Status: AC
  Administered 2011-12-09: 50 mg via ORAL

## 2011-12-09 MED ORDER — DIPHENHYDRAMINE HCL 25 MG PO CAPS
ORAL_CAPSULE | ORAL | Status: AC
  Filled 2011-12-09: qty 2

## 2011-12-09 MED ORDER — HEPARIN SOD (PORK) LOCK FLUSH 100 UNIT/ML IV SOLN
500.0000 [IU] | Freq: Once | INTRAVENOUS | Status: AC | PRN
  Administered 2011-12-09: 500 [IU]
  Filled 2011-12-09: qty 5

## 2011-12-09 MED ORDER — RITUXIMAB CHEMO INJECTION 10 MG/ML
375.0000 mg/m2 | Freq: Once | INTRAVENOUS | Status: AC
  Administered 2011-12-09: 800 mg via INTRAVENOUS
  Filled 2011-12-09: qty 80

## 2011-12-09 MED ORDER — ACETAMINOPHEN 325 MG PO TABS
ORAL_TABLET | ORAL | Status: AC
  Filled 2011-12-09: qty 2

## 2011-12-09 MED ORDER — ACETAMINOPHEN 325 MG PO TABS
650.0000 mg | ORAL_TABLET | Freq: Once | ORAL | Status: AC
  Administered 2011-12-09: 650 mg via ORAL

## 2011-12-09 NOTE — Telephone Encounter (Signed)
CRITICAL VALUE ALERT Critical value received:  WBC 1.4 Date of notification:  12/09/11 Time of notification: 11:05 Critical value read back:  yes Nurse who received alert:  Tobie Lords, RN MD notified (1st page):  Dr. Mariel Sleet @ 11:15am

## 2011-12-10 ENCOUNTER — Ambulatory Visit (INDEPENDENT_AMBULATORY_CARE_PROVIDER_SITE_OTHER): Payer: Medicare Other | Admitting: *Deleted

## 2011-12-10 DIAGNOSIS — Z7901 Long term (current) use of anticoagulants: Secondary | ICD-10-CM

## 2011-12-10 DIAGNOSIS — I4891 Unspecified atrial fibrillation: Secondary | ICD-10-CM

## 2011-12-31 ENCOUNTER — Other Ambulatory Visit: Payer: Self-pay | Admitting: Cardiology

## 2012-01-07 ENCOUNTER — Ambulatory Visit (INDEPENDENT_AMBULATORY_CARE_PROVIDER_SITE_OTHER): Payer: Medicare Other | Admitting: *Deleted

## 2012-01-07 DIAGNOSIS — I4891 Unspecified atrial fibrillation: Secondary | ICD-10-CM

## 2012-01-07 DIAGNOSIS — Z7901 Long term (current) use of anticoagulants: Secondary | ICD-10-CM

## 2012-01-07 LAB — POCT INR: INR: 1.9

## 2012-01-21 ENCOUNTER — Encounter (HOSPITAL_COMMUNITY): Payer: Medicare Other | Attending: Oncology

## 2012-01-21 DIAGNOSIS — C8299 Follicular lymphoma, unspecified, extranodal and solid organ sites: Secondary | ICD-10-CM | POA: Insufficient documentation

## 2012-01-21 DIAGNOSIS — Z452 Encounter for adjustment and management of vascular access device: Secondary | ICD-10-CM

## 2012-01-21 MED ORDER — HEPARIN SOD (PORK) LOCK FLUSH 100 UNIT/ML IV SOLN
500.0000 [IU] | Freq: Once | INTRAVENOUS | Status: AC
  Administered 2012-01-21: 500 [IU] via INTRAVENOUS
  Filled 2012-01-21: qty 5

## 2012-01-21 MED ORDER — HEPARIN SOD (PORK) LOCK FLUSH 100 UNIT/ML IV SOLN
INTRAVENOUS | Status: AC
  Filled 2012-01-21: qty 5

## 2012-01-21 MED ORDER — SODIUM CHLORIDE 0.9 % IJ SOLN
10.0000 mL | INTRAMUSCULAR | Status: DC | PRN
  Administered 2012-01-21: 10 mL via INTRAVENOUS
  Filled 2012-01-21: qty 10

## 2012-01-21 NOTE — Progress Notes (Signed)
Charles [name] presented for Portacath access and flush.  Proper placement of portacath confirmed by CXR.  Portacath located left chest wall accessed with  H 20 needle.  Portacath flushed with 20ml NS and 500U/42ml Heparin and needle removed intact.  Procedure without incident.  Patient tolerated procedure well.

## 2012-01-28 ENCOUNTER — Ambulatory Visit (INDEPENDENT_AMBULATORY_CARE_PROVIDER_SITE_OTHER): Payer: Medicare Other | Admitting: *Deleted

## 2012-01-28 DIAGNOSIS — I4891 Unspecified atrial fibrillation: Secondary | ICD-10-CM

## 2012-01-28 DIAGNOSIS — Z7901 Long term (current) use of anticoagulants: Secondary | ICD-10-CM

## 2012-01-28 LAB — POCT INR: INR: 4.9

## 2012-01-30 ENCOUNTER — Emergency Department (HOSPITAL_COMMUNITY): Payer: Medicare Other

## 2012-01-30 ENCOUNTER — Telehealth: Payer: Self-pay | Admitting: Cardiology

## 2012-01-30 ENCOUNTER — Inpatient Hospital Stay (HOSPITAL_COMMUNITY)
Admission: EM | Admit: 2012-01-30 | Discharge: 2012-02-04 | DRG: 871 | Disposition: A | Discharge status: Home Health | Payer: Medicare Other | Attending: Internal Medicine | Admitting: Internal Medicine

## 2012-01-30 ENCOUNTER — Encounter (HOSPITAL_COMMUNITY): Payer: Self-pay | Admitting: *Deleted

## 2012-01-30 DIAGNOSIS — Z79899 Other long term (current) drug therapy: Secondary | ICD-10-CM

## 2012-01-30 DIAGNOSIS — J189 Pneumonia, unspecified organism: Secondary | ICD-10-CM

## 2012-01-30 DIAGNOSIS — I509 Heart failure, unspecified: Secondary | ICD-10-CM | POA: Diagnosis present

## 2012-01-30 DIAGNOSIS — A4152 Sepsis due to Pseudomonas: Principal | ICD-10-CM | POA: Diagnosis present

## 2012-01-30 DIAGNOSIS — Z7901 Long term (current) use of anticoagulants: Secondary | ICD-10-CM

## 2012-01-30 DIAGNOSIS — E782 Mixed hyperlipidemia: Secondary | ICD-10-CM

## 2012-01-30 DIAGNOSIS — A419 Sepsis, unspecified organism: Secondary | ICD-10-CM

## 2012-01-30 DIAGNOSIS — C8589 Other specified types of non-Hodgkin lymphoma, extranodal and solid organ sites: Secondary | ICD-10-CM | POA: Diagnosis present

## 2012-01-30 DIAGNOSIS — I4891 Unspecified atrial fibrillation: Secondary | ICD-10-CM

## 2012-01-30 DIAGNOSIS — G4700 Insomnia, unspecified: Secondary | ICD-10-CM

## 2012-01-30 DIAGNOSIS — Z8711 Personal history of peptic ulcer disease: Secondary | ICD-10-CM

## 2012-01-30 DIAGNOSIS — E872 Acidosis, unspecified: Secondary | ICD-10-CM

## 2012-01-30 DIAGNOSIS — R7881 Bacteremia: Secondary | ICD-10-CM

## 2012-01-30 DIAGNOSIS — F411 Generalized anxiety disorder: Secondary | ICD-10-CM | POA: Diagnosis present

## 2012-01-30 DIAGNOSIS — J151 Pneumonia due to Pseudomonas: Secondary | ICD-10-CM | POA: Diagnosis present

## 2012-01-30 DIAGNOSIS — F17200 Nicotine dependence, unspecified, uncomplicated: Secondary | ICD-10-CM

## 2012-01-30 DIAGNOSIS — D630 Anemia in neoplastic disease: Secondary | ICD-10-CM | POA: Diagnosis present

## 2012-01-30 DIAGNOSIS — I428 Other cardiomyopathies: Secondary | ICD-10-CM | POA: Diagnosis present

## 2012-01-30 DIAGNOSIS — N179 Acute kidney failure, unspecified: Secondary | ICD-10-CM

## 2012-01-30 DIAGNOSIS — R0902 Hypoxemia: Secondary | ICD-10-CM | POA: Diagnosis present

## 2012-01-30 DIAGNOSIS — B965 Pseudomonas (aeruginosa) (mallei) (pseudomallei) as the cause of diseases classified elsewhere: Secondary | ICD-10-CM

## 2012-01-30 DIAGNOSIS — M19019 Primary osteoarthritis, unspecified shoulder: Secondary | ICD-10-CM | POA: Diagnosis present

## 2012-01-30 DIAGNOSIS — D72819 Decreased white blood cell count, unspecified: Secondary | ICD-10-CM

## 2012-01-30 DIAGNOSIS — C8290 Follicular lymphoma, unspecified, unspecified site: Secondary | ICD-10-CM

## 2012-01-30 DIAGNOSIS — M1710 Unilateral primary osteoarthritis, unspecified knee: Secondary | ICD-10-CM | POA: Diagnosis present

## 2012-01-30 DIAGNOSIS — E079 Disorder of thyroid, unspecified: Secondary | ICD-10-CM

## 2012-01-30 DIAGNOSIS — I2510 Atherosclerotic heart disease of native coronary artery without angina pectoris: Secondary | ICD-10-CM | POA: Diagnosis present

## 2012-01-30 DIAGNOSIS — IMO0002 Reserved for concepts with insufficient information to code with codable children: Secondary | ICD-10-CM

## 2012-01-30 DIAGNOSIS — I10 Essential (primary) hypertension: Secondary | ICD-10-CM

## 2012-01-30 DIAGNOSIS — Z888 Allergy status to other drugs, medicaments and biological substances status: Secondary | ICD-10-CM

## 2012-01-30 DIAGNOSIS — R6521 Severe sepsis with septic shock: Secondary | ICD-10-CM

## 2012-01-30 DIAGNOSIS — Z8249 Family history of ischemic heart disease and other diseases of the circulatory system: Secondary | ICD-10-CM

## 2012-01-30 LAB — COMPREHENSIVE METABOLIC PANEL
ALT: 12 U/L (ref 0–53)
Alkaline Phosphatase: 96 U/L (ref 39–117)
CO2: 20 mEq/L (ref 19–32)
Calcium: 9.6 mg/dL (ref 8.4–10.5)
GFR calc Af Amer: 26 mL/min — ABNORMAL LOW (ref >90)
GFR calc non Af Amer: 22 mL/min — ABNORMAL LOW (ref >90)
Glucose, Bld: 112 mg/dL — ABNORMAL HIGH (ref 70–99)
Sodium: 133 mEq/L — ABNORMAL LOW (ref 135–145)

## 2012-01-30 LAB — CBC WITH DIFFERENTIAL/PLATELET
Basophils Relative: 1 % (ref 0–1)
Eosinophils Relative: 0 % (ref 0–5)
HCT: 33.3 % — ABNORMAL LOW (ref 39.0–52.0)
Hemoglobin: 11.3 g/dL — ABNORMAL LOW (ref 13.0–17.0)
Lymphs Abs: 0.1 10*3/uL — ABNORMAL LOW (ref 0.7–4.0)
MCH: 29.8 pg (ref 26.0–34.0)
MCV: 87.9 fL (ref 78.0–100.0)
Monocytes Absolute: 0.2 10*3/uL (ref 0.1–1.0)
Neutrophils Relative %: 73 % (ref 43–77)
RBC: 3.79 MIL/uL — ABNORMAL LOW (ref 4.22–5.81)

## 2012-01-30 LAB — URINALYSIS, ROUTINE W REFLEX MICROSCOPIC
Hgb urine dipstick: NEGATIVE
Protein, ur: NEGATIVE mg/dL
Urobilinogen, UA: 0.2 mg/dL (ref 0.0–1.0)

## 2012-01-30 LAB — APTT: aPTT: 54 seconds — ABNORMAL HIGH (ref 24–37)

## 2012-01-30 LAB — PROTIME-INR
INR: 1.85 — ABNORMAL HIGH (ref 0.00–1.49)
Prothrombin Time: 20.7 seconds — ABNORMAL HIGH (ref 11.6–15.2)

## 2012-01-30 MED ORDER — ONDANSETRON HCL 4 MG/2ML IJ SOLN
INTRAMUSCULAR | Status: AC
  Administered 2012-01-30: 4 mg
  Filled 2012-01-30: qty 2

## 2012-01-30 MED ORDER — NOREPINEPHRINE BITARTRATE 1 MG/ML IJ SOLN
2.0000 ug/min | INTRAVENOUS | Status: DC
  Administered 2012-01-30: 2 ug/min via INTRAVENOUS
  Filled 2012-01-30 (×2): qty 4

## 2012-01-30 MED ORDER — NOREPINEPHRINE BITARTRATE 1 MG/ML IJ SOLN
2.0000 ug/min | INTRAMUSCULAR | Status: DC
  Administered 2012-01-31: 34.987 ug/min via INTRAVENOUS
  Administered 2012-02-01: 9 ug/min via INTRAVENOUS
  Filled 2012-01-30 (×4): qty 16

## 2012-01-30 MED ORDER — SODIUM CHLORIDE 0.9 % IV SOLN
1000.0000 mL | Freq: Once | INTRAVENOUS | Status: AC
  Administered 2012-01-30: 1000 mL via INTRAVENOUS

## 2012-01-30 MED ORDER — PIPERACILLIN-TAZOBACTAM 3.375 G IVPB
3.3750 g | Freq: Once | INTRAVENOUS | Status: AC
  Administered 2012-01-30: 3.375 g via INTRAVENOUS
  Filled 2012-01-30: qty 50

## 2012-01-30 MED ORDER — SODIUM CHLORIDE 0.9 % IV SOLN
1000.0000 mL | INTRAVENOUS | Status: DC
  Administered 2012-01-30: 1000 mL via INTRAVENOUS

## 2012-01-30 MED ORDER — NOREPINEPHRINE BITARTRATE 1 MG/ML IJ SOLN
INTRAMUSCULAR | Status: AC
  Filled 2012-01-30: qty 4

## 2012-01-30 MED ORDER — VANCOMYCIN HCL IN DEXTROSE 1-5 GM/200ML-% IV SOLN
1000.0000 mg | Freq: Once | INTRAVENOUS | Status: AC
  Administered 2012-01-30: 1000 mg via INTRAVENOUS
  Filled 2012-01-30: qty 200

## 2012-01-30 NOTE — ED Notes (Signed)
CRITICAL VALUE ALERT  Critical value received:  WBC 1.3  Date of notification:  01/30/2012  Time of notification:  1810  Critical value read back:yes  Nurse who received alert:  Wilkie Aye Zeah Germano,rn  MD notified (1st page):  Dr. Bernette Mayers  Time of first page:  1810  MD notified (2nd page):  Time of second page:  Responding MD:  Dr Bernette Mayers  Time MD responded:  [id number]

## 2012-01-30 NOTE — Telephone Encounter (Signed)
Spoke with wife.  She has discussed symptoms with Dr Michelle Nasuti office and they recommended she take pt to [facility] ED for evaluation.

## 2012-01-30 NOTE — Telephone Encounter (Signed)
Patient's wife states that patient is having chills and "can't get warm".  Wants to know if this has anything to do with his "blood being thin". / tg

## 2012-01-30 NOTE — ED Notes (Signed)
Pt on blood thinners, holding med due to blood too thin, last night with chills, then diaphoresis while bed last night, was instructed to come here for blood work and evaluation

## 2012-01-30 NOTE — ED Notes (Signed)
Pt vomited large amount bilious green/brown emesis. EDP notified.

## 2012-01-30 NOTE — ED Notes (Signed)
AC called for levophed. To be mixed and delivered.

## 2012-01-30 NOTE — ED Provider Notes (Signed)
History   This chart was scribed for Charles [name] B. Bernette Mayers, MD by Toya Smothers, ED Scribe. The patient was seen in room APA17/APA17. Patient's care was started at 1638.  CSN: [id number]  Arrival date & time 01/30/12  1638   First MD Initiated Contact with Patient 01/30/12 1650      Chief Complaint  Patient presents with  . Chills   HPI  DAUNDRE BIEL is a 71 y.o. male who presents to the Emergency Department complaining of 24 hours of new, sudden onset, constant, moderate chills with moderate generalized weakness. Typically healthy at baseline, Pt reports sudden chills last night, with fever (Tmax unknown) and appx 40 min of associated tremors. Despite the use of heated blankets, pt reports no relief. This morning Pt woke in a diaphoretic sweat, reporting continuous generalized weakness and difficulty urinating. Symptoms have not been treated PTA. No blood in stool, blackened stool, abdominal pain, congestion, rhinorrhea, chest pain, SOB, or n/v/d. He denotes receiving Dx of Asthmatic Bronchitis 1 week ago, with Rx of Levaquin which has provided moderate relief. Medical Hx includes Basal cell carcinoma, Lymphoma Pneumonia (2010), HTN, and atrial fibulation (2010). Pt is currently undergoing chemotherapy, last treated 2 months ago. Recently had elevated INR and has held his coumadin dose the last few days.     Past Medical History  Diagnosis Date  . 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 Hypertension   . Atrial fibrillation 10/2008  . Nonischemic cardiomyopathy 02/2009    Presented with CHF; EF of 25-30%; Nonobstructive ASCVD-worst lesion 40% on coronary angiography  . Peptic ulcer disease   . Basal cell carcinoma   . Degenerative joint disease     Knees and shoulders  . Pneumonia 2010  . Low TSH level     Borderline  . Allergic rhinitis   . Anxiety   . Drug abuse   . Tobacco abuse     50 pack years    Past Surgical History  Procedure Date  . Neck lesion biopsy June 24, 2010   Follicular lymphoma  . Skin cancer excision     under left breast; variously described as melanoma and basal cell  . Rotator cuff repair     right  . Patella fracture surgery 1975  . Hematoma evacuation     after melanoma removal  . Portacath placement 07/12/2010  . Colonoscopy 2009    Also underwent an EGD; patient reports no significant findings    Family History  Problem Relation Age of Onset  . Heart attack Father   . Coronary artery disease Other     History  Substance Use Topics  . Smoking status: Current Every Day Smoker -- 1.0 packs/day for 50 years    Types: Cigarettes  . Smokeless tobacco: Not on file     Comment: Recent consumption of 1/5 pack a day  . Alcohol Use: No   Review of Systems  Constitutional: Positive for fever, chills and diaphoresis.  HENT: Negative for congestion and rhinorrhea.   Respiratory: Positive for cough. Negative for chest tightness and shortness of breath.   Cardiovascular: Negative for leg swelling.  Gastrointestinal: Positive for nausea. Negative for abdominal pain, diarrhea, blood in stool and anal bleeding.  Skin: Negative for pallor.  Neurological: Positive for tremors. Negative for seizures, syncope and light-headedness.  Psychiatric/Behavioral: Negative for confusion.  All other systems reviewed and are negative.   Allergies  Statins  Home Medications   Current Outpatient Rx  Name  Route  Sig  Dispense  Refill  . ALPRAZOLAM 1 MG PO TABS   Oral   Take 0.5 mg by mouth 4 (four) times daily.          Marland Kitchen CARVEDILOL 25 MG PO TABS      TAKE ONE TABLET TWICE DAILY   60 tablet   10   . DILTIAZEM HCL ER COATED BEADS 240 MG PO CP24      TAKE ONE CAPSULE BY MOUTH DAILY   30 capsule   3   . OMEGA-3 FATTY ACIDS 1000 MG PO CAPS   Oral   Take 1,500 mg by mouth daily.         . FUROSEMIDE 40 MG PO TABS   Oral   Take 20 mg by mouth every other day.           Marland Kitchen LISINOPRIL 10 MG PO TABS   Oral   Take 1 tablet (10 mg  total) by mouth daily.   30 tablet   3   . LORAZEPAM 1 MG PO TABS   Oral   Take 1 mg by mouth daily as needed. nerves         . LOVASTATIN 10 MG PO TABS   Oral   Take 10 mg by mouth at bedtime.         Marland Kitchen NIACIN ER (ANTIHYPERLIPIDEMIC) 500 MG PO TBCR   Oral   Take 1 tablet (500 mg total) by mouth at bedtime.   30 tablet   6   . ENSURE PO   Oral   Take 1 Bottle by mouth daily.          Marland Kitchen OMEPRAZOLE 20 MG PO CPDR      TAKE ONE (1) CAPSULE EACH DAY   30 capsule   6   . TRAMADOL HCL 50 MG PO TABS   Oral   Take 100 mg by mouth 4 (four) times daily.           . WARFARIN SODIUM 5 MG PO TABS   Oral   Take 2.5-5 mg by mouth as directed. Patient takes 1 tablet(5mg ) every day except on Friday patient takes 1/2 tablet(2.5mg )           BP 73/43  Pulse 82  Temp 100.8 F (38.2 C) (Rectal)  Resp 18  Ht 5\' 7"  (1.702 m)  Wt 208 lb (94.348 kg)  BMI 32.58 kg/m2  SpO2 97%  Physical Exam  Nursing note and vitals reviewed. Constitutional: He is oriented to person, place, and time. He appears well-developed and well-nourished.  HENT:  Head: Normocephalic and atraumatic.  Eyes: EOM are normal. Pupils are equal, round, and reactive to light.  Neck: Normal range of motion. Neck supple.  Cardiovascular: Normal rate, normal heart sounds and intact distal pulses.   Pulmonary/Chest: Effort normal and breath sounds normal. No respiratory distress. He has no wheezes. He has no rales.  Abdominal: Soft. Bowel sounds are normal. He exhibits no distension. There is no tenderness.  Musculoskeletal: Normal range of motion. He exhibits no edema and no tenderness.  Neurological: He is alert and oriented to person, place, and time. He has normal strength. No cranial nerve deficit or sensory deficit.  Skin: Skin is warm and dry. No rash noted. There is pallor.  Psychiatric: He has a normal mood and affect.    ED Course  Procedures DIAGNOSTIC STUDIES: Oxygen Saturation is 96% on room  air, adequate by my interpretation.    COORDINATION OF CARE: 16:52- Evaluated Pt. Pt  is awake, alert, and without distress. He is hypotensive and febrile, will evaluate for source of possible sepsis.  16:59- Ordered CBC WITH DIFFERENTIAL, Comprehensive metabolic panel, Urinalysis, Routine w reflex microscopic, Urine culture, and ATTP. 17:00- Ordered DG Chest Port 1 View 1 time imaging. 17:06- Ordered ED EKG. 18:10- Rechecked Pt.    Labs Reviewed  CBC WITH DIFFERENTIAL - Abnormal; Notable for the following:    WBC 1.3 (*)     RBC 3.79 (*)     Hemoglobin 11.3 (*)     HCT 33.3 (*)     Lymphocytes Relative 10 (*)     Monocytes Relative 16 (*)     Neutro Abs 1.0 (*)     Lymphs Abs 0.1 (*)     All other components within normal limits  COMPREHENSIVE METABOLIC PANEL - Abnormal; Notable for the following:    Sodium 133 (*)     Glucose, Bld 112 (*)     BUN 36 (*)     Creatinine, Ser 2.69 (*)     GFR calc non Af Amer 22 (*)     GFR calc Af Amer 26 (*)     All other components within normal limits  LACTIC ACID, PLASMA - Abnormal; Notable for the following:    Lactic Acid, Venous 3.6 (*)     All other components within normal limits  APTT - Abnormal; Notable for the following:    aPTT 54 (*)     All other components within normal limits  PROTIME-INR - Abnormal; Notable for the following:    Prothrombin Time 20.7 (*)     INR 1.85 (*)     All other components within normal limits  TYPE AND SCREEN  TROPONIN I  OCCULT BLOOD, POC DEVICE  CULTURE, BLOOD (ROUTINE X 2)  CULTURE, BLOOD (ROUTINE X 2)  URINALYSIS, ROUTINE W REFLEX MICROSCOPIC  URINE CULTURE   Dg Chest Port 1 View  01/30/2012  *RADIOLOGY REPORT*  Clinical Data: Fever, cough.  Bronchitis.  History of asthma. History of lymphoma and melanoma.  Weakness, chills.  PORTABLE CHEST - 1 VIEW  Comparison: 01/12/2012  Findings: The patient has a left-sided power port, tip to the superior vena cava.  Heart size is accentuated by the  portable position. There is minimal density in the medial right lung base, raising the question of early infiltrate.  No evidence for edema. The patient has had prior right shoulder arthroplasty.  IMPRESSION: Question of early right lower lobe infiltrate.   Original Report Authenticated By: Norva Pavlov, M.D.      1. Septic shock   2. Leukopenia   3. HCAP (healthcare-associated pneumonia)   4. Acute renal failure   5. Lactic acidosis       MDM   Date: 01/30/2012  Rate: 69  Rhythm: atrial fibrillation  QRS Axis: normal  Intervals: normal  ST/T Wave abnormalities: normal  Conduction Disutrbances:none  Narrative Interpretation:   Old EKG Reviewed: changes noted, afib has returned   Pt with minimal improvement in BP with 2500cc NS, will start levophed in [place]. Given Vanc/Zosyn for neutropenia and septic shock, unclear source. CXR concerning for early PNA but doubt this would cause his degree of symptoms. Discussed with PCCM at [facility] who will accept for transfer.   CRITICAL CARE Performed by: Pollyann Savoy   Total critical care time: 60  Critical care time was exclusive of separately billable procedures and treating other patients.  Critical care was necessary to treat or prevent imminent or  life-threatening deterioration.  Critical care was time spent personally by me on the following activities: development of treatment plan with patient and/or surrogate as well as nursing, discussions with consultants, evaluation of patient's response to treatment, examination of patient, obtaining history from patient or surrogate, ordering and performing treatments and interventions, ordering and review of laboratory studies, ordering and review of radiographic studies, pulse oximetry and re-evaluation of patient's condition.    I personally performed the services described in this documentation, which was scribed in my presence. The recorded information has been reviewed and is  accurate.       Relena Ivancic B. Bernette Mayers, MD 01/30/12 2255

## 2012-01-30 NOTE — ED Notes (Signed)
Pt presents with c/o weakness and fatigue worsening over past week. Pt is bone cancer pt receiving chemotherapy. Skin color is pallor, skin warm and dry to touch. Pt is resting at this time, arouses easily. Side rails up x 2 per safety.  Pt's SAO2 91 while at rest. [place] placed on pt at 2 LPM 100%.

## 2012-01-30 NOTE — H&P (Signed)
Name: Charles [name] MRN: [id number] DOB: 04-Dec-1940    LOS: 0   PULMONARY / CRITICAL CARE MEDICINE H&P HPI: 71 y.o. male with lymphoma treated last 2 months ago with maintenance Rituximab. Transferred for hypotension and fevers.  He presented to [facility] ED with 24 hours of fevers chills and rigors.  Contacted MD and advised to come to the ED. He has generalized weakness.  He has had one episode of nausea and emesis today.  1 week ago he was diagnosed with bronchitis with cough and sputum production.  He was treated with a course of levaquin which he completed a few days ago.  He still has a cough but less sputum production.  In ED he was given 3500 cc NS with persistent hypotension.  He was started on norepinephrine.  He was given vancomycin and zosyn.   Past Medical History  Diagnosis Date  . 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 Hypertension   . Atrial fibrillation 10/2008  . Nonischemic cardiomyopathy 02/2009    Presented with CHF; EF of 25-30%; Nonobstructive ASCVD-worst lesion 40% on coronary angiography  . Peptic ulcer disease   . Basal cell carcinoma   . Degenerative joint disease     Knees and shoulders  . Pneumonia 2010  . Low TSH level     Borderline  . Allergic rhinitis   . Anxiety   . Drug abuse   . Tobacco abuse     50 pack years   Past Surgical History  Procedure Date  . Neck lesion biopsy June 24, 2010    Follicular lymphoma  . Skin cancer excision     under left breast; variously described as melanoma and basal cell  . Rotator cuff repair     right  . Patella fracture surgery 1975  . Hematoma evacuation     after melanoma removal  . Portacath placement 07/12/2010  . Colonoscopy 2009    Also underwent an EGD; patient reports no significant findings   Prior to Admission medications   Medication Sig Start Date End Date Taking? Authorizing Provider  ALPRAZolam Prudy Feeler) 1 MG tablet Take 0.5 mg by mouth 4 (four) times daily.    Yes Historical Provider, MD    carvedilol (COREG) 25 MG tablet Take 25 mg by mouth 2 (two) times daily.   Yes Historical Provider, MD  diltiazem (CARDIZEM CD) 240 MG 24 hr capsule Take 240 mg by mouth daily.   Yes Historical Provider, MD  furosemide (LASIX) 40 MG tablet Take 20 mg by mouth every other day.     Yes Historical Provider, MD  HYDROcodone-homatropine (HYCODAN) 5-1.5 MG/5ML syrup Take 5 mLs by mouth every 4 (four) hours as needed. For cough   Yes Historical Provider, MD  lisinopril (PRINIVIL,ZESTRIL) 10 MG tablet Take 1 tablet (10 mg total) by mouth daily. 11/22/10  Yes Jodelle Gross, NP  LORazepam (ATIVAN) 1 MG tablet Take 1 mg by mouth at bedtime as needed. nerves   Yes Historical Provider, MD  lovastatin (MEVACOR) 10 MG tablet Take 10 mg by mouth every morning.    Yes Historical Provider, MD  Nutritional Supplements (ENSURE PO) Take 1 Bottle by mouth daily.    Yes Historical Provider, MD  omeprazole (PRILOSEC) 20 MG capsule Take 20 mg by mouth daily.   Yes Historical Provider, MD  oxyCODONE-acetaminophen (PERCOCET/ROXICET) 5-325 MG per tablet Take 0.5-1 tablets by mouth 4 (four) times daily as needed. For pain 01/23/12  Yes Historical Provider, MD  potassium chloride (K-DUR) 10  MEQ tablet Take 10 mEq by mouth daily.   Yes Historical Provider, MD  PROAIR HFA 108 (90 BASE) MCG/ACT inhaler Inhale 1 puff into the lungs every 6 (six) hours as needed. For shortness of breath 01/15/12  Yes Historical Provider, MD  traMADol (ULTRAM) 50 MG tablet Take 50-100 mg by mouth 4 (four) times daily as needed. For pain   Yes Historical Provider, MD  warfarin (COUMADIN) 5 MG tablet Take 2.5-5 mg by mouth as directed. Patient takes 1 tablet(5mg ) every day except on Friday patient takes 1/2 tablet(2.5mg ) 05/20/11  Yes Jodelle Gross, NP   Allergies Allergies  Allergen Reactions  . Statins     Intolerant secondary to severe myalgias    Family History Family History  Problem Relation Age of Onset  . Heart attack Father    . Coronary artery disease Other    Social History  reports that he has been smoking Cigarettes.  He has a 50 pack-year smoking history. He does not have any smokeless tobacco history on file. He reports that he does not drink alcohol or use illicit drugs.  Review Of Systems:  Review of 12 systems negative except as listed in HPI  Brief patient description:  71 yo M with NHL, leukopenia and sepsis.  Events Since Admission:   Current Status:  Vital Signs: Temp:  [100.8 F (38.2 C)] 100.8 F (38.2 C) (11/08 1707) Pulse Rate:  [66-82] 80  (11/08 2230) Resp:  [18-28] 28  (11/08 2230) BP: (66-90)/(36-54) 72/36 mmHg (11/08 2230) SpO2:  [92 %-98 %] 95 % (11/08 2230) FiO2 (%):  [100 %] 100 % (11/08 2230) Weight:  [94.348 kg (208 lb)] 94.348 kg (208 lb) (11/08 1641)  Physical Examination: General:  Lying comfortably in bed Neuro:  Alert, oriented x 3. Moves all 4 extremities HEENT: PERRL, EOMI, sclera clear, MMM Neck:  Supple, no cervical or supraclav adenopathy, no thyromegaly Cardiovascular:  Irreg, no m/r/g Lungs:  Nl WOB, R>L crackles at the bases Abdomen:  Soft, NT, ND, nl BS Musculoskeletal:  Joints wnl, no spine tenderness Skin:  No rash  Active Problems:  * No active hospital problems. *    ASSESSMENT AND PLAN  PULMONARY No results found for this basename: PHART:5,PCO2:5,PCO2ART:5,PO2ART:5,HCO3:5,O2SAT:5 in the last 168 hours Ventilator Settings: Vent Mode:  [-]  FiO2 (%):  [100 %] 100 % CXR:  Possible early RLL PNA  A:  Hypoxia, Possible PNA as etiology of sepsis P:   - Cover with antibiotics for PNA as below - O2 as needed - Watch for worsening hypoxia with fluid resescutation - Repeat CXR to see if RLL infiltrate evolving with IVF  CARDIOVASCULAR  Lab 01/30/12 1728  TROPONINI <0.30  LATICACIDVEN 3.6*  PROBNP --   ECG:  Afib, rate 69 Lines:  Left port from home RIJ 01/31/12>>>  A: Hypotension from sepsis possible 2/2 PNA, h/o HTN, NICM, and  afib P:  - RIJ placed for CVP, EGDT - Start on sepsis protocol - Continue NE, assess CVP for continued fluid resuscitation - Hold home rate control and antihypertensive medications  RENAL  Lab 01/30/12 1728  NA 133*  K 4.4  CL 97  CO2 20  BUN 36*  CREATININE 2.69*  CALCIUM 9.6  MG --  PHOS --   Intake/Output    None    Foley:  none  A:  AKI, likely 2/2 hypovolemia and/or sepsis P:   - Monitor UOP - Repeat electrolytes with fluid resuscitation, further w/u of AKI if not  improving - Monitor UOP  GASTROINTESTINAL  Lab 01/30/12 1728  AST 14  ALT 12  ALKPHOS 96  BILITOT 0.8  PROT 7.3  ALBUMIN 3.5    A:  No issues P:   N/A  HEMATOLOGIC  Lab 01/30/12 1728 01/28/12 1537  HGB 11.3* --  HCT 33.3* --  PLT 191 --  INR 1.85* 4.9  APTT 54* --   A:  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, Anticoagulation for afib P:  - INR sub therapeutic, Hold coumadin for now  INFECTIOUS  Lab 01/30/12 1728  WBC 1.3*  PROCALCITON --   Cultures: Blood X2 11/8 >>> Blood x2 11/9>>> Urine 11/8>>> Antibiotics: Cefepime 11/9 >>> Vancomycin 11/8>>> Levaquin 11/9>>>  A:  Sepsis, probable PNA P:   - Cover with Cefepime, vancomycin and Levaquin given immunosuppression - F/u culture - Collect sputum if able - legionella and strep pneumo urine Ag  NEUROLOGIC  A:  Anxiety at home P:   -Continue home Xanax prn  BEST PRACTICE / DISPOSITION Level of Care:  ICU Primary Service: PCCM Consultants:  None Code Status:  Full Diet:  Regular DVT Px:  Heparin SQ GI Px:  Protonix for home PPI Skin Integrity:  No issues Social / Family:  Present at bedside  Critical care time 65 min  Mareon Robinette, M.D. Pulmonary and Critical Care Medicine [facility] Pager: [phone]  01/30/2012, 11:30 PM

## 2012-01-30 NOTE — ED Notes (Signed)
EDP notified of pt status. No new orders received.

## 2012-01-31 ENCOUNTER — Inpatient Hospital Stay (HOSPITAL_COMMUNITY): Payer: Medicare Other

## 2012-01-31 DIAGNOSIS — A419 Sepsis, unspecified organism: Secondary | ICD-10-CM | POA: Diagnosis present

## 2012-01-31 DIAGNOSIS — J189 Pneumonia, unspecified organism: Secondary | ICD-10-CM | POA: Diagnosis present

## 2012-01-31 LAB — CBC WITH DIFFERENTIAL/PLATELET
Band Neutrophils: 0 % (ref 0–10)
Basophils Relative: 0 % (ref 0–1)
Blasts: 0 %
Eosinophils Relative: 0 % (ref 0–5)
HCT: 29.9 % — ABNORMAL LOW (ref 39.0–52.0)
Hemoglobin: 10.1 g/dL — ABNORMAL LOW (ref 13.0–17.0)
Lymphocytes Relative: 18 % (ref 12–46)
Lymphs Abs: 0.4 10*3/uL — ABNORMAL LOW (ref 0.7–4.0)
Monocytes Absolute: 0.2 10*3/uL (ref 0.1–1.0)
Monocytes Relative: 10 % (ref 3–12)
RBC: 3.49 MIL/uL — ABNORMAL LOW (ref 4.22–5.81)
RDW: 14.3 % (ref 11.5–15.5)
WBC: 2.4 10*3/uL — ABNORMAL LOW (ref 4.0–10.5)

## 2012-01-31 LAB — COMPREHENSIVE METABOLIC PANEL
ALT: 9 U/L (ref 0–53)
AST: 14 U/L (ref 0–37)
Albumin: 2.7 g/dL — ABNORMAL LOW (ref 3.5–5.2)
Alkaline Phosphatase: 75 U/L (ref 39–117)
CO2: 20 mEq/L (ref 19–32)
Chloride: 101 mEq/L (ref 96–112)
Potassium: 3.9 mEq/L (ref 3.5–5.1)
Total Bilirubin: 0.6 mg/dL (ref 0.3–1.2)

## 2012-01-31 LAB — CARBOXYHEMOGLOBIN
Carboxyhemoglobin: 1.1 % (ref 0.5–1.5)
Methemoglobin: 1.3 % (ref 0.0–1.5)
O2 Saturation: 71 %

## 2012-01-31 LAB — TYPE AND SCREEN
ABO/RH(D): O POS
Antibody Screen: NEGATIVE

## 2012-01-31 LAB — LACTIC ACID, PLASMA: Lactic Acid, Venous: 1.8 mmol/L (ref 0.5–2.2)

## 2012-01-31 LAB — CREATININE, URINE, RANDOM: Creatinine, Urine: 145.72 mg/dL

## 2012-01-31 LAB — SODIUM, URINE, RANDOM: Sodium, Ur: 16 mEq/L

## 2012-01-31 MED ORDER — ASPIRIN 81 MG PO CHEW
324.0000 mg | CHEWABLE_TABLET | ORAL | Status: AC
  Administered 2012-01-31: 324 mg via ORAL
  Filled 2012-01-31: qty 4

## 2012-01-31 MED ORDER — HYDROCORTISONE SOD SUCCINATE 100 MG IJ SOLR
50.0000 mg | Freq: Four times a day (QID) | INTRAMUSCULAR | Status: DC
  Administered 2012-01-31 – 2012-02-02 (×8): 50 mg via INTRAVENOUS
  Filled 2012-01-31 (×12): qty 1

## 2012-01-31 MED ORDER — ALPRAZOLAM 0.5 MG PO TABS
0.5000 mg | ORAL_TABLET | Freq: Three times a day (TID) | ORAL | Status: DC | PRN
  Administered 2012-01-31 – 2012-02-04 (×6): 0.5 mg via ORAL
  Filled 2012-01-31 (×6): qty 1

## 2012-01-31 MED ORDER — TRAMADOL HCL 50 MG PO TABS
100.0000 mg | ORAL_TABLET | Freq: Two times a day (BID) | ORAL | Status: DC | PRN
  Administered 2012-02-02: 100 mg via ORAL
  Filled 2012-01-31 (×2): qty 2

## 2012-01-31 MED ORDER — SODIUM CHLORIDE 0.9 % IV BOLUS (SEPSIS)
1000.0000 mL | Freq: Once | INTRAVENOUS | Status: AC
  Administered 2012-01-31: 1000 mL via INTRAVENOUS

## 2012-01-31 MED ORDER — VANCOMYCIN HCL IN DEXTROSE 1-5 GM/200ML-% IV SOLN
1000.0000 mg | INTRAVENOUS | Status: DC
  Administered 2012-01-31: 1000 mg via INTRAVENOUS
  Filled 2012-01-31 (×2): qty 200

## 2012-01-31 MED ORDER — VASOPRESSIN 20 UNIT/ML IJ SOLN
0.0300 [IU]/min | INTRAVENOUS | Status: DC
  Administered 2012-01-31: 0.03 [IU]/min via INTRAVENOUS
  Filled 2012-01-31 (×3): qty 2.5

## 2012-01-31 MED ORDER — LEVOFLOXACIN IN D5W 500 MG/100ML IV SOLN: 500.0000 mg | INTRAVENOUS | Status: DC

## 2012-01-31 MED ORDER — ASPIRIN 300 MG RE SUPP
300.0000 mg | RECTAL | Status: AC
  Filled 2012-01-31: qty 1

## 2012-01-31 MED ORDER — DEXTROSE 5 % IV SOLN
1.0000 g | INTRAVENOUS | Status: DC
  Administered 2012-01-31 – 2012-02-01 (×2): 1 g via INTRAVENOUS
  Filled 2012-01-31 (×2): qty 1

## 2012-01-31 MED ORDER — DEXTROSE 5 % IV SOLN
2.0000 ug/min | INTRAVENOUS | Status: DC | PRN
  Filled 2012-01-31: qty 4

## 2012-01-31 MED ORDER — PANTOPRAZOLE SODIUM 40 MG PO TBEC
40.0000 mg | DELAYED_RELEASE_TABLET | Freq: Every day | ORAL | Status: DC
  Administered 2012-01-31 – 2012-02-04 (×5): 40 mg via ORAL
  Filled 2012-01-31 (×5): qty 1

## 2012-01-31 MED ORDER — ALBUTEROL SULFATE HFA 108 (90 BASE) MCG/ACT IN AERS
2.0000 | INHALATION_SPRAY | RESPIRATORY_TRACT | Status: DC | PRN
  Filled 2012-01-31: qty 6.7

## 2012-01-31 MED ORDER — LEVOFLOXACIN IN D5W 750 MG/150ML IV SOLN
750.0000 mg | Freq: Once | INTRAVENOUS | Status: AC
  Administered 2012-01-31: 750 mg via INTRAVENOUS
  Filled 2012-01-31: qty 150

## 2012-01-31 MED ORDER — HEPARIN SODIUM (PORCINE) 5000 UNIT/ML IJ SOLN
5000.0000 [IU] | Freq: Three times a day (TID) | INTRAMUSCULAR | Status: DC
  Administered 2012-01-31 – 2012-02-02 (×7): 5000 [IU] via SUBCUTANEOUS
  Filled 2012-01-31 (×10): qty 1

## 2012-01-31 MED ORDER — SODIUM CHLORIDE 0.9 % IV BOLUS (SEPSIS): 500.0000 mL | Freq: Once | INTRAVENOUS | Status: DC

## 2012-01-31 MED ORDER — TRAMADOL HCL 50 MG PO TABS: 100.0000 mg | ORAL_TABLET | Freq: Four times a day (QID) | ORAL | Status: DC | PRN

## 2012-01-31 MED ORDER — SODIUM CHLORIDE 0.9 % IV BOLUS (SEPSIS): 500.0000 mL | INTRAVENOUS | Status: DC | PRN

## 2012-01-31 MED ORDER — HYDROCODONE-ACETAMINOPHEN 5-325 MG PO TABS
1.0000 | ORAL_TABLET | Freq: Four times a day (QID) | ORAL | Status: DC | PRN
  Administered 2012-01-31 – 2012-02-02 (×8): 1 via ORAL
  Filled 2012-01-31 (×9): qty 1

## 2012-01-31 MED ORDER — DOBUTAMINE IN D5W 4-5 MG/ML-% IV SOLN
2.5000 ug/kg/min | INTRAVENOUS | Status: DC | PRN
  Filled 2012-01-31: qty 250

## 2012-01-31 MED ORDER — SODIUM CHLORIDE 0.9 % IV SOLN: 250.0000 mL | INTRAVENOUS | Status: DC | PRN

## 2012-01-31 NOTE — Progress Notes (Signed)
ANTIBIOTIC CONSULT NOTE - INITIAL  Pharmacy Consult for Vancocin/Maxipime/Levaquin Indication: rule out pneumonia and rule out sepsis  Allergies  Allergen Reactions  . Statins     Intolerant secondary to severe myalgias    Patient Measurements: Height: 5\' 7"  (170.2 cm) Weight: 206 lb 2.1 oz (93.5 kg) IBW/kg (Calculated) : 66.1   Vital Signs: Temp: 99.9 F (37.7 C) (11/08 2345) Temp src: Oral (11/08 2345) BP: 83/55 mmHg (11/08 2345) Pulse Rate: 89  (11/08 2345)  Labs:  Basename 01/30/12 1728  WBC 1.3*  HGB 11.3*  PLT 191  LABCREA --  CREATININE 2.69*   Estimated Creatinine Clearance: 27.1 ml/min (by C-G formula based on Cr of 2.69).  Microbiology: No results found for this or any previous visit (from the past 720 hour(s)).  Medical History: Past Medical History  Diagnosis Date  . 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 Hypertension   . Atrial fibrillation 10/2008  . Nonischemic cardiomyopathy 02/2009    Presented with CHF; EF of 25-30%; Nonobstructive ASCVD-worst lesion 40% on coronary angiography  . Peptic ulcer disease   . Basal cell carcinoma   . Degenerative joint disease     Knees and shoulders  . Pneumonia 2010  . Low TSH level     Borderline  . Allergic rhinitis   . Anxiety   . Drug abuse   . Tobacco abuse     50 pack years    Medications:  Prescriptions prior to admission  Medication Sig Dispense Refill  . ALPRAZolam (XANAX) 1 MG tablet Take 0.5 mg by mouth 4 (four) times daily.       . carvedilol (COREG) 25 MG tablet Take 25 mg by mouth 2 (two) times daily.      Marland Kitchen diltiazem (CARDIZEM CD) 240 MG 24 hr capsule Take 240 mg by mouth daily.      . furosemide (LASIX) 40 MG tablet Take 20 mg by mouth every other day.        Marland Kitchen HYDROcodone-homatropine (HYCODAN) 5-1.5 MG/5ML syrup Take 5 mLs by mouth every 4 (four) hours as needed. For cough      . lisinopril (PRINIVIL,ZESTRIL) 10 MG tablet Take 1 tablet (10 mg total) by mouth daily.  30 tablet  3  . LORazepam  (ATIVAN) 1 MG tablet Take 1 mg by mouth at bedtime as needed. nerves      . lovastatin (MEVACOR) 10 MG tablet Take 10 mg by mouth every morning.       . Nutritional Supplements (ENSURE PO) Take 1 Bottle by mouth daily.       Marland Kitchen omeprazole (PRILOSEC) 20 MG capsule Take 20 mg by mouth daily.      Marland Kitchen oxyCODONE-acetaminophen (PERCOCET/ROXICET) 5-325 MG per tablet Take 0.5-1 tablets by mouth 4 (four) times daily as needed. For pain      . potassium chloride (K-DUR) 10 MEQ tablet Take 10 mEq by mouth daily.      Marland Kitchen PROAIR HFA 108 (90 BASE) MCG/ACT inhaler Inhale 1 puff into the lungs every 6 (six) hours as needed. For shortness of breath      . traMADol (ULTRAM) 50 MG tablet Take 50-100 mg by mouth 4 (four) times daily as needed. For pain      . warfarin (COUMADIN) 5 MG tablet Take 2.5-5 mg by mouth as directed. Patient takes 1 tablet(5mg ) every day except on Friday patient takes 1/2 tablet(2.5mg )       Scheduled:    . [COMPLETED] sodium chloride  1,000 mL Intravenous Once  Followed by  . [COMPLETED] sodium chloride  1,000 mL Intravenous Once  . aspirin  324 mg Oral NOW   Or  . aspirin  300 mg Rectal NOW  . ceFEPime (MAXIPIME) IV  1 g Intravenous Q24H  . heparin  5,000 Units Subcutaneous Q8H  . levofloxacin (LEVAQUIN) IV  500 mg Intravenous Q48H  . levofloxacin (LEVAQUIN) IV  750 mg Intravenous Once  . [COMPLETED] ondansetron      . pantoprazole  40 mg Oral Daily  . [COMPLETED] piperacillin-tazobactam (ZOSYN)  IV  3.375 g Intravenous Once  . sodium chloride  500 mL Intravenous Once  . [COMPLETED] vancomycin  1,000 mg Intravenous Once  . vancomycin  1,000 mg Intravenous Q24H   Infusions:    . norepinephrine (LEVOPHED) Adult infusion 20 mcg/min (01/31/12 0003)  . [DISCONTINUED] sodium chloride 1,000 mL (01/30/12 2139)  . [DISCONTINUED] norepinephrine (LEVOPHED) Adult infusion 24 mcg/min (01/30/12 2234)    Assessment: 71yo male c/o sudden chills, fever, and weakness, found at [facility] to be  hypotensive, CXR concerning for possible early PNA, tx'd to [facility] for management of sepsis, to begin IV ABX.  Goal of Therapy:  Vancomycin trough level 15-20 mcg/ml  Plan:  Rec'd vanc 1g and Zosyn 3.375g IV in ED; will continue with vancomycin 1000mg  IV Q24H and add Levaquin 750mg  IV x1 followed by 500mg  IV Q48H as well as Maxipime 1g IV Q24H and monitor CBC, Cx, levels prn.  Colleen Can PharmD BCPS 01/31/2012,12:53 AM

## 2012-01-31 NOTE — Progress Notes (Signed)
Attempted to place art line per md order, under sterile technique, unsuccessfully.  Good blood return, but unable to thread catheter.  Patient tolerated well, no adverse effects, minimal bleeding.  Current BP 92/49.  Dr. Detterding informed, OK to hold on ABG.  RN at bedside.

## 2012-01-31 NOTE — Progress Notes (Signed)
Pt has not voided since tx to 2100. Bladder scanned the pt (max ). Pt does not feel the urge to urinate. Gave pt fluids and will continue to monitor.

## 2012-01-31 NOTE — Progress Notes (Signed)
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 Transferred for hypotension and fevers.  He presented to [facility] ED 11/8 with 24 hours of fevers chills and rigors.  Contacted MD and advised to come to the ED. He has generalized weakness.  He has had one episode of nausea and emesis today.  1 week ago he was diagnosed with bronchitis with cough and sputum production.  He was treated with a course of levaquin which he completed a few days ago.  He still has a cough but less sputum production.  In ED he was given 3500 cc NS with persistent hypotension.  He was started on norepinephrine.  He was given vancomycin and zosyn.  PMH- CHF, EF 35%, AFibn,  tobacco abuse x 50y    Brief patient description:  71 yo M with NHL, leukopenia and sepsis.  Events Since Admission: 11/9 on 35 mcg levo gtt   Current Status: Lethargic, did not get sleep last night  Vital Signs: Temp:  [99.2 F (37.3 C)-100.8 F (38.2 C)] 99.2 F (37.3 C) (11/09 0752) Pulse Rate:  [66-133] 107  (11/09 0800) Resp:  [18-30] 29  (11/09 0800) BP: (66-118)/(36-72) 93/56 mmHg (11/09 0800) SpO2:  [92 %-100 %] 94 % (11/09 0800) FiO2 (%):  [100 %] 100 % (11/08 2230) Weight:  [93.5 kg (206 lb 2.1 oz)-94.348 kg (208 lb)] 93.5 kg (206 lb 2.1 oz) (11/08 2345)  Physical Examination: General:  Lying comfortably in bed Neuro:  Alert, oriented x 3. Moves all 4 extremities HEENT: PERRL, EOMI, sclera clear, MMM Neck:  Supple, no cervical or supraclav adenopathy, no thyromegaly Cardiovascular:  Irreg, no m/r/g Lungs:  Nl WOB, R>L crackles at the bases Abdomen:  Soft, NT, ND, nl BS Musculoskeletal:  Joints wnl, no spine tenderness Skin:  No rash  Active Problems:  * No active hospital problems. *    ASSESSMENT AND PLAN  PULMONARY  Lab 01/31/12 0345  PHART --  PCO2ART --  PO2ART --    HCO3 --  O2SAT 71.0   Ventilator Settings: Vent Mode:  [-]  FiO2 (%):  [100 %] 100 % CXR:  Better defined RLL PNA  A:  Hypoxia, Possible PNA as etiology of sepsis P:   - Cover with antibiotics for PNA as below - O2 as needed - Watch for worsening hypoxia with fluid resescutation   CARDIOVASCULAR  Lab 01/31/12 0100 01/30/12 1728  TROPONINI -- <0.30  LATICACIDVEN 1.8 3.6*  PROBNP -- --   ECG:  Afib, rate 69 Lines:  Left port from home RIJ 01/31/12>>>  A: Septic shock possible 2/2 PNA, h/o HTN, NICM, and afib P:  - Continue NE, add vaso -CVP 9- NS bolus to lower pressors - Hold home rate control and antihypertensive medications  RENAL  Lab 01/31/12 0100 01/30/12 1728  NA 133* 133*  K 3.9 4.4  CL 101 97  CO2 20 20  BUN 37* 36*  CREATININE 2.28* 2.69*  CALCIUM 8.1* 9.6  MG 1.7 --  PHOS 3.0 --   Intake/Output      11/08 0701 - 11/09 0700 11/09 0701 - 11/10 0700   I.V. 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, Anticoagulation for afib P:  - INR sub therapeutic, Hold coumadin for now  INFECTIOUS  Lab 01/31/12 0100 01/30/12 1728  WBC 2.4* 1.3*  PROCALCITON -- --   Cultures: Blood X2 11/8 >>> Blood x2 11/9>>> Urine 11/8>>> Antibiotics: Cefepime 11/9 >>> Vancomycin 11/8>>> Levaquin 11/9>>>  A:  Sepsis, probable PNA P:   - Cover with  Cefepime, vancomycin and Levaquin given immunosuppression - F/u culture - Collect sputum if able - legionella and strep pneumo urine Ag  NEUROLOGIC  A:  Anxiety at home P:   -Continue home Xanax prn  BEST PRACTICE / DISPOSITION Level of Care:  ICU Primary Service: PCCM Consultants:  None Code Status:  Full DVT Px:  Heparin SQ GI Px:  Protonix for home PPI Skin Integrity:  No issues Social / Family:  Present at bedside  Critical care time 35 min  Oretha Milch., M.D. Pulmonary and Critical Care Medicine [facility] Pager: [phone]  01/31/2012, 9:26 AM

## 2012-01-31 NOTE — Procedures (Signed)
Central Venous Catheter Insertion Procedure Note RANELL ALYEA [id number] 1941/02/11  Procedure: Insertion of Central Venous Catheter Indications: Assessment of intravascular volume, Drug and/or fluid administration and Frequent blood sampling  Procedure Details Consent: Risks of procedure as well as the alternatives and risks of each were explained to the (patient/caregiver).  Consent for procedure obtained. Time Out: Verified patient identification, verified procedure, site/side was marked, verified correct patient position, special equipment/implants available, medications/allergies/relevent history reviewed, required imaging and test results available.  Performed  Maximum sterile technique was used including antiseptics, cap, gloves, gown, hand hygiene, mask and sheet. Skin prep: Chlorhexidine; local anesthetic administered A antimicrobial bonded/coated triple lumen catheter was placed in the right internal jugular vein using the Seldinger technique.  Evaluation Blood flow good Complications: No apparent complications Patient did tolerate procedure well. Chest X-ray ordered to verify placement.  CXR: pending.  Johnathan Heskett 01/31/2012, 1:08 AM

## 2012-01-31 NOTE — Progress Notes (Signed)
UR completed. Soma Lizak RN BSN  

## 2012-02-01 ENCOUNTER — Inpatient Hospital Stay (HOSPITAL_COMMUNITY): Payer: Medicare Other

## 2012-02-01 LAB — BASIC METABOLIC PANEL
BUN: 30 mg/dL — ABNORMAL HIGH (ref 6–23)
Creatinine, Ser: 1.02 mg/dL (ref 0.50–1.35)
GFR calc Af Amer: 83 mL/min — ABNORMAL LOW (ref >90)
GFR calc non Af Amer: 71 mL/min — ABNORMAL LOW (ref >90)

## 2012-02-01 LAB — URINE CULTURE: Culture: NO GROWTH

## 2012-02-01 LAB — LEGIONELLA ANTIGEN, URINE: Legionella Antigen, Urine: NEGATIVE

## 2012-02-01 LAB — CBC
MCHC: 33.8 g/dL (ref 30.0–36.0)
RDW: 14.2 % (ref 11.5–15.5)

## 2012-02-01 LAB — EXPECTORATED SPUTUM ASSESSMENT W GRAM STAIN, RFLX TO RESP C

## 2012-02-01 MED ORDER — VANCOMYCIN HCL 1000 MG IV SOLR
1250.0000 mg | Freq: Two times a day (BID) | INTRAVENOUS | Status: DC
  Administered 2012-02-01 – 2012-02-02 (×2): 1250 mg via INTRAVENOUS
  Filled 2012-02-01 (×2): qty 1250

## 2012-02-01 MED ORDER — ONDANSETRON HCL 4 MG/2ML IJ SOLN
4.0000 mg | Freq: Four times a day (QID) | INTRAMUSCULAR | Status: DC | PRN
  Administered 2012-02-01: 4 mg via INTRAVENOUS
  Filled 2012-02-01: qty 2

## 2012-02-01 MED ORDER — LEVOFLOXACIN IN D5W 750 MG/150ML IV SOLN
750.0000 mg | INTRAVENOUS | Status: DC
  Administered 2012-02-01 – 2012-02-03 (×3): 750 mg via INTRAVENOUS
  Filled 2012-02-01 (×4): qty 150

## 2012-02-01 MED ORDER — DEXTROSE 5 % IV SOLN
2.0000 g | Freq: Three times a day (TID) | INTRAVENOUS | Status: DC
  Administered 2012-02-01 – 2012-02-03 (×6): 2 g via INTRAVENOUS
  Filled 2012-02-01 (×9): qty 2

## 2012-02-01 NOTE — Progress Notes (Signed)
ANTIBIOTIC CONSULT NOTE - FOLLOW UP  Pharmacy Consult for vancomcyin/cefepime/levaquin Indication: septic shock with pseudomonal bacteremia  Patient Measurements: Height: 5\' 7"  (170.2 cm) Weight: 206 lb 2.1 oz (93.5 kg) IBW/kg (Calculated) : 66.1   Vital Signs: Temp: 98.2 F (36.8 C) (11/10 0424) Temp src: Oral (11/10 0424) BP: 97/68 mmHg (11/10 1100) Pulse Rate: 96  (11/10 1100) Intake/Output from previous day: 11/09 0701 - 11/10 0700 In: 2078.3 [P.O.:780; I.V.:1048.3; IV Piggyback:250] Out: 1805 [Urine:1805] Intake/Output from this shift: Total I/O In: 76 [I.V.:76] Out: 375 [Urine:375]  Labs:  [facility] 02/01/12 0500 01/31/12 1020 01/31/12 0100 01/30/12 1728  WBC 3.2* -- 2.4* 1.3*  HGB 9.9* -- 10.1* 11.3*  PLT 131* -- 146* 191  LABCREA -- 145.72 -- --  CREATININE 1.02 -- 2.28* 2.69*   Estimated Creatinine Clearance: 71.4 ml/min (by C-G formula based on Cr of 1.02). No results found for this basename: VANCOTROUGH:2,VANCOPEAK:2,VANCORANDOM:2,GENTTROUGH:2,GENTPEAK:2,GENTRANDOM:2,TOBRATROUGH:2,TOBRAPEAK:2,TOBRARND:2,AMIKACINPEAK:2,AMIKACINTROU:2,AMIKACIN:2, in the last 72 hours   Microbiology: Recent Results (from the past 720 hour(s))  CULTURE, BLOOD (ROUTINE X 2)     Status: Normal (Preliminary result)   Collection Time   01/30/12  5:27 PM      Component Value Range Status Comment   Specimen Description BLOOD RIGHT ANTECUBITAL   Final    Special Requests BOTTLES DRAWN AEROBIC AND ANAEROBIC 14CC   Final    Culture  Setup Time 01/31/2012 21:46   Final    Culture     Final    Value: PSEUDOMONAS AERUGINOSA     Note: CRITICAL RESULT CALLED TO, READ BACK BY AND VERIFIED WITH: JAMES DUNLOP 9:35AM 02/01/12 BY RUSCA.   Report Status PENDING   Incomplete   CULTURE, BLOOD (ROUTINE X 2)     Status: Normal (Preliminary result)   Collection Time   01/30/12  6:05 PM      Component Value Range Status Comment   Specimen Description BLOOD LEFT WRIST   Final    Special Requests  BOTTLES DRAWN AEROBIC AND ANAEROBIC [facility]   Final    Culture  Setup Time 01/31/2012 21:46   Final    Culture     Final    Value: PSEUDOMONAS AERUGINOSA     Note: CRITICAL RESULT CALLED TO, READ BACK BY AND VERIFIED WITH: JAMES DUNLOP 02/01/12 @ 9:35AM BY RUSCA.   Report Status PENDING   Incomplete   MRSA PCR SCREENING     Status: Normal   Collection Time   01/30/12 11:41 PM      Component Value Range Status Comment   MRSA by PCR NEGATIVE  NEGATIVE Final   CULTURE, EXPECTORATED SPUTUM-ASSESSMENT     Status: Normal   Collection Time   02/01/12  6:02 AM      Component Value Range Status Comment   Specimen Description SPUTUM   Final    Special Requests NONE   Final    Sputum evaluation     Final    Value: MICROSCOPIC FINDINGS SUGGEST THAT THIS SPECIMEN IS NOT REPRESENTATIVE OF LOWER RESPIRATORY SECRETIONS. PLEASE RECOLLECT.     CALLED TO Marcene Brawn RN AT [phone] 02/01/12 MITCHELL,L   Report Status 02/01/2012 FINAL   Final    Assessment: 71 year old male being treated for lymphoma with rituximab, now admitted with septic shock and cx positive pseudomonal bacteremia. Patient was started on broad triple antibiotic therapy with vancomycin/levaquin/cefepime. He presented with AKI which has now resolved, will aggressively titrate his abx given immunocompromised state.  Goal of Therapy:  Vancomycin trough level 15-20  mcg/ml  Plan:  Increase vancomycin to 1250mg  IV q12h Increase Levaquin to 750mg  q24h Increase cefepime to 2g q8 hours Measure antibiotic drug levels at steady state Follow up culture results  Charles [name] 02/01/2012,11:40 AM

## 2012-02-01 NOTE — Progress Notes (Addendum)
Name: Charles [name] MRN: [id number] DOB: January 21, 1941    LOS: 2   PULMONARY / CRITICAL CARE MEDICINE  HPI: 71 y.o. male with lymphoma presented to [facility] ED 11/8 with 24 hours of fevers chills and rigors.  Admitted with leukopenia and sepsis r/t HCAP.   Events Since Admission: 11/9 on 35 mcg levo gtt  Current Status:  Feels "like a new man" this am.  Weaning pressors to off.  Pseudomonas in 2/2 blood cultures.   Vital Signs: Temp:  [97.4 F (36.3 C)-98.9 F (37.2 C)] 98.2 F (36.8 C) (11/10 0424) Pulse Rate:  [46-102] 95  (11/10 0800) Resp:  [19-33] 25  (11/10 0800) BP: (105-138)/(60-101) 134/77 mmHg (11/10 0800) SpO2:  [85 %-98 %] 98 % (11/10 0800)  Physical Examination: General:  NAD sitting OOB in chair  Neuro:  Alert, oriented x 3. Moves all 4 extremities HEENT: PERRL, EOMI, sclera clear, MMM Neck:  Supple, no cervical or supraclav adenopathy, no thyromegaly Cardiovascular:  Irreg, no m/r/g Lungs:  resps even non labored on [place],  R>L crackles at the bases Abdomen:  Soft, NT, ND, nl BS Musculoskeletal:  Joints wnl, no spine tenderness Skin:  No rash  Active Problems:  Septic shock(785.52)  Pneumonia   ASSESSMENT AND PLAN  PULMONARY  Lab 01/31/12 0345  PHART --  PCO2ART --  PO2ART --  HCO3 --  O2SAT 71.0   Ventilator Settings:   CXR:  11/10>> RLL PNA   A:  Hypoxia, Possible PNA as etiology of sepsis P:   - Cover with antibiotics for PNA as below - O2 as needed - pulm hygiene   CARDIOVASCULAR  Lab 01/31/12 0100 01/30/12 1728  TROPONINI -- <0.30  LATICACIDVEN 1.8 3.6*  PROBNP -- --   ECG:  Afib, rate 69 Lines:  Left port from home RIJ 01/31/12>>>  A: Septic shock possible 2/2 PNA, h/o HTN, NICM, and afib P:  - Continue NE/ vasopressin, wean as tol  - Hold home rate control and antihypertensive medications -taper stress dose steroids once off pressors, cortisol 35  RENAL  Lab 02/01/12 0500 01/31/12 0100 01/30/12 1728  NA 133* 133*  133*  K 4.0 3.9 --  CL 102 101 97  CO2 21 20 20   BUN 30* 37* 36*  CREATININE 1.02 2.28* 2.69*  CALCIUM 8.5 8.1* 9.6  MG -- 1.7 --  PHOS -- 3.0 --   Intake/Output      11/09 0701 - 11/10 0700 11/10 0701 - 11/11 0700   P.O. 780    I.V. (mL/kg) 1048.3 (11.2)    IV Piggyback 250    Total Intake(mL/kg) 2078.3 (22.2)    Urine (mL/kg/hr) 1805 (0.8)    Total Output 1805    Net +273.3          Foley:  none  A:  AKI, likely 2/2 hypovolemia and/or sepsis - much improved 11/10 P:   - Monitor UOP,, can dc foley - f/u chem   GASTROINTESTINAL  Lab 01/31/12 0100 01/30/12 1728  AST 14 14  ALT 9 12  ALKPHOS 75 96  BILITOT 0.6 0.8  PROT 5.9* 7.3  ALBUMIN 2.7* 3.5    A:  No issues P:   Advance diet  HEMATOLOGIC  Lab 02/01/12 0500 01/31/12 0100 01/30/12 1728 01/28/12 1537  HGB 9.9* 10.1* 11.3* --  HCT 29.3* 29.9* 33.3* --  PLT 131* 146* 191 --  INR -- 1.93* 1.85* 4.9  APTT -- 50* 54* --   A:  Chronic Leukopenia  2/2 lymphoma and chemotherapy,  Anticoagulation for afib P:  - INR sub therapeutic, Hold coumadin for now - f/u coags in am, consider restart coumadin  - SQ heparin   INFECTIOUS  Lab 02/01/12 0500 01/31/12 0100 01/30/12 1728  WBC 3.2* 2.4* 1.3*  PROCALCITON -- -- --   Cultures: Blood X2 11/8 >>>pseudomonas>>> Blood x2 11/9>>>GNR>>> Urine 11/8>>>  Antibiotics: Cefepime 11/9 >>> Vancomycin 11/8>>> Levaquin 11/9>>>  A:  Sepsis, probable PNA Pseudomonas bacteremia  P:   - Cont Cefepime, and Levaquin given immunosuppression - consider d/c vanc once final cx back - F/u repeat blood culture - await sensitivities , do we have to worry about bowel translocation here?  NEUROLOGIC  A:  Anxiety at home P:   -Continue home Xanax prn  BEST PRACTICE / DISPOSITION Level of Care:  ICU Primary Service: PCCM Consultants:  None Code Status:  Full DVT Px:  Heparin SQ GI Px:  Protonix for home PPI Skin Integrity:  No issues Social / Family:  Present at  bedside  [facility], NP 02/01/2012  9:32 AM Pager: [phone] or [phone]  *Care during the described time interval was provided by me and/or other providers on the critical care team. I have reviewed this patient's available data, including medical history, events of note, physical examination and test results as part of my evaluation. cct ime x 31 m  ALVA,RAKESH V.

## 2012-02-02 DIAGNOSIS — R6521 Severe sepsis with septic shock: Secondary | ICD-10-CM

## 2012-02-02 DIAGNOSIS — A419 Sepsis, unspecified organism: Secondary | ICD-10-CM

## 2012-02-02 DIAGNOSIS — J189 Pneumonia, unspecified organism: Secondary | ICD-10-CM

## 2012-02-02 DIAGNOSIS — N179 Acute kidney failure, unspecified: Secondary | ICD-10-CM

## 2012-02-02 DIAGNOSIS — C8299 Follicular lymphoma, unspecified, extranodal and solid organ sites: Secondary | ICD-10-CM

## 2012-02-02 DIAGNOSIS — I4891 Unspecified atrial fibrillation: Secondary | ICD-10-CM

## 2012-02-02 DIAGNOSIS — Z7901 Long term (current) use of anticoagulants: Secondary | ICD-10-CM

## 2012-02-02 DIAGNOSIS — R6520 Severe sepsis without septic shock: Secondary | ICD-10-CM

## 2012-02-02 LAB — CBC
Hemoglobin: 10.3 g/dL — ABNORMAL LOW (ref 13.0–17.0)
MCH: 29.1 pg (ref 26.0–34.0)
MCHC: 35.2 g/dL (ref 30.0–36.0)
MCV: 82.8 fL (ref 78.0–100.0)
RBC: 3.54 MIL/uL — ABNORMAL LOW (ref 4.22–5.81)

## 2012-02-02 LAB — PROTIME-INR: Prothrombin Time: 15.1 seconds (ref 11.6–15.2)

## 2012-02-02 LAB — BASIC METABOLIC PANEL
CO2: 23 mEq/L (ref 19–32)
Calcium: 8.8 mg/dL (ref 8.4–10.5)
Creatinine, Ser: 0.89 mg/dL (ref 0.50–1.35)
GFR calc non Af Amer: 83 mL/min — ABNORMAL LOW (ref >90)
Glucose, Bld: 105 mg/dL — ABNORMAL HIGH (ref 70–99)
Sodium: 137 mEq/L (ref 135–145)

## 2012-02-02 LAB — CULTURE, BLOOD (ROUTINE X 2)

## 2012-02-02 MED ORDER — WARFARIN SODIUM 7.5 MG PO TABS
7.5000 mg | ORAL_TABLET | Freq: Once | ORAL | Status: AC
  Administered 2012-02-02: 7.5 mg via ORAL
  Filled 2012-02-02: qty 1

## 2012-02-02 MED ORDER — METOPROLOL TARTRATE 25 MG PO TABS
25.0000 mg | ORAL_TABLET | Freq: Two times a day (BID) | ORAL | Status: DC
  Administered 2012-02-02 – 2012-02-04 (×5): 25 mg via ORAL
  Filled 2012-02-02 (×7): qty 1

## 2012-02-02 MED ORDER — HYDROCORTISONE SOD SUCCINATE 100 MG IJ SOLR
25.0000 mg | Freq: Four times a day (QID) | INTRAMUSCULAR | Status: DC
  Administered 2012-02-02 – 2012-02-03 (×4): 25 mg via INTRAVENOUS
  Filled 2012-02-02 (×8): qty 0.5

## 2012-02-02 MED ORDER — HEPARIN (PORCINE) IN NACL 100-0.45 UNIT/ML-% IJ SOLN
1250.0000 [IU]/h | INTRAMUSCULAR | Status: DC
  Administered 2012-02-02 – 2012-02-03 (×2): 1250 [IU]/h via INTRAVENOUS
  Filled 2012-02-02 (×3): qty 250

## 2012-02-02 MED ORDER — WARFARIN - PHARMACIST DOSING INPATIENT: Freq: Every day | Status: DC

## 2012-02-02 NOTE — Progress Notes (Signed)
Name: Charles [name] MRN: [id number] DOB: [birth year]    LOS: 3   PULMONARY / CRITICAL CARE MEDICINE  HPI: 71 y.o. male with lymphoma presented to [facility] ED 11/8 with 24 hours of fevers chills and rigors.  Admitted with leukopenia and sepsis r/t HCAP.   Events Since Admission: 11/9 on 35 mcg levo gtt 11/11- no pressors  Current Status:  remsins off pressors, in chair this am , no distress  Vital Signs: Temp:  [97.8 F (36.6 C)-98.3 F (36.8 C)] 98 F (36.7 C) (11/11 0454) Pulse Rate:  [68-115] 109  (11/11 0600) Resp:  [22-31] 26  (11/11 0600) BP: (97-134)/(59-85) 127/83 mmHg (11/11 0600) SpO2:  [90 %-100 %] 99 % (11/11 0600)  Physical Examination: General:  NAD sitting OOB in chair no distress Neuro:  Alert, oriented x 3. Moves all 4 extremities HEENT: PERRL 3mm Neck:  Supple Cardiovascular:  Irreg, no m/r/g (known fib) Lungs:  Coughing, clear anterior Abdomen:  Soft, NT, ND, nl BS Musculoskeletal:  Joints wnl, no spine tenderness Skin:  No rash  Active Problems:  Septic shock(785.52)  Pneumonia   ASSESSMENT AND PLAN  PULMONARY  Lab 01/31/12 0345  PHART --  PCO2ART --  PO2ART --  HCO3 --  O2SAT 71.0   Ventilator Settings:   CXR:  11/10>> RLL PNA more alvelar  A:  PNA rt base P:   - rt base more delinated but clinically resolving this -pcxr follow up in am  -no distress -O2 limited and low, reduce if able then to off then ambulating  CARDIOVASCULAR  Lab 01/31/12 0100 01/30/12 1728  TROPONINI -- <0.30  LATICACIDVEN 1.8 3.6*  PROBNP -- --   ECG:  Afib, rate 69 Lines:  Left port from home RIJ 01/31/12>>>goal out 11/11  A: Septic shock possible 2/2 PNA, h/o HTN, NICM, and afib, controlled overall P:  - dc pressors from [facility] -taper stress dose steroids this am , slow Add metoprolol, short acting vs his home coreg for improved rate control -dc line  RENAL  Lab 02/02/12 0500 02/01/12 0500 01/31/12 0100 01/30/12 1728  NA 137 133* 133*  133*  K 3.7 4.0 -- --  CL 105 102 101 97  CO2 23 21 20 20   BUN 23 30* 37* 36*  CREATININE 0.89 1.02 2.28* 2.69*  CALCIUM 8.8 8.5 8.1* 9.6  MG -- -- 1.7 --  PHOS -- -- 3.0 --   Intake/Output      11/10 0701 - 11/11 0700   P.O. 960   I.V. (mL/kg) 674 (7.2)   IV Piggyback 450   Total Intake(mL/kg) 2084 (22.3)   Urine (mL/kg/hr) 2875 (1.3)   Total Output 2875   Net -791        Foley:  none  A:  AKI, resolving 11/11 P:   - kvo -dc foley - f/u chem   GASTROINTESTINAL  Lab 01/31/12 0100 01/30/12 1728  AST 14 14  ALT 9 12  ALKPHOS 75 96  BILITOT 0.6 0.8  PROT 5.9* 7.3  ALBUMIN 2.7* 3.5    A:  Reg diet P:   Advance diet tolerated Ppi, may be able to dc  HEMATOLOGIC  Lab 02/02/12 0500 02/01/12 0500 01/31/12 0100 01/30/12 1728 01/28/12 1537  HGB 10.3* 9.9* 10.1* 11.3* --  HCT 29.3* 29.3* 29.9* 33.3* --  PLT 128* 131* 146* 191 --  INR 1.21 -- 1.93* 1.85* 4.9  APTT -- -- 50* 54* --   A:  Chronic Leukopenia 2/2 lymphoma and  chemotherapy,  Anticoagulation for afib P:  - add heparin drip to coumadin, per pharmacy - SQ heparin  dc  INFECTIOUS  Lab 02/02/12 0500 02/01/12 0500 01/31/12 0100 01/30/12 1728  WBC 3.6* 3.2* 2.4* 1.3*  PROCALCITON -- -- -- --   Cultures: Blood X2 11/8 >>>pseudomonas>>> Blood x2 11/9>>>GNR>>> Urine 11/8>>>  Antibiotics: Cefepime 11/9 >>> Vancomycin 11/8>>>11/11 Levaquin 11/9>>>  A:  Sepsis, probable PNA Pseudomonas bacteremia  P:   - Cont Cefepime, and Levaquin given immunosuppression until sens known - consider d/c vanc today - F/u repeat blood culture sent, but has made clinical improveemnt  NEUROLOGIC  A:  Anxiety at home P:   -Continue home Xanax prn PT  BEST PRACTICE / DISPOSITION Level of Care:  ICU to floor Primary Service: PCCM to traid Consultants:  None Code Status:  Full DVT Px:  Heparin SQ to IV  And cou 11/11 GI Px:  Protonix for home PPI Skin Integrity:  No issues Social / Family:  Present at  bedside  Mcarthur Rossetti. Tyson Alias, MD, FACP Pgr: [phone] [facility]

## 2012-02-02 NOTE — Progress Notes (Signed)
ANTICOAGULATION CONSULT NOTE - Initial Consult  Pharmacy Consult for heparin and Coumadin Indication: atrial fibrillation  Allergies  Allergen Reactions  . Statins     Intolerant secondary to severe myalgias    Patient Measurements: Height: 5\' 7"  (170.2 cm) Weight: 206 lb 2.1 oz (93.5 kg) IBW/kg (Calculated) : 66.1  Heparin Dosing Weight: 87kg  Vital Signs: Temp: 98 F (36.7 C) (11/11 0454) Temp src: Oral (11/11 0454) BP: 127/83 mmHg (11/11 0600) Pulse Rate: 109  (11/11 0600)  Labs:  Basename 02/02/12 0500 02/01/12 0500 01/31/12 0100 01/30/12 1728  HGB 10.3* 9.9* -- --  HCT 29.3* 29.3* 29.9* --  PLT 128* 131* 146* --  APTT -- -- 50* 54*  LABPROT 15.1 -- 21.3* 20.7*  INR 1.21 -- 1.93* 1.85*  HEPARINUNFRC -- -- -- --  CREATININE 0.89 1.02 2.28* --  CKTOTAL -- -- -- --  CKMB -- -- -- --  TROPONINI -- -- -- <0.30    Estimated Creatinine Clearance: 81.8 ml/min (by C-G formula based on Cr of 0.89).   Medical History: Past Medical History  Diagnosis Date  . 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 Hypertension   . Atrial fibrillation 10/2008  . Nonischemic cardiomyopathy 02/2009    Presented with CHF; EF of 25-30%; Nonobstructive ASCVD-worst lesion 40% on coronary angiography  . Peptic ulcer disease   . Basal cell carcinoma   . Degenerative joint disease     Knees and shoulders  . Pneumonia 2010  . Low TSH level     Borderline  . Allergic rhinitis   . Anxiety   . Drug abuse   . Tobacco abuse     50 pack years    Medications:  Prescriptions prior to admission  Medication Sig Dispense Refill  . ALPRAZolam (XANAX) 1 MG tablet Take 0.5 mg by mouth 4 (four) times daily.       . carvedilol (COREG) 25 MG tablet Take 25 mg by mouth 2 (two) times daily.      Marland Kitchen diltiazem (CARDIZEM CD) 240 MG 24 hr capsule Take 240 mg by mouth daily.      . furosemide (LASIX) 40 MG tablet Take 20 mg by mouth every other day.        Marland Kitchen HYDROcodone-homatropine (HYCODAN) 5-1.5 MG/5ML syrup  Take 5 mLs by mouth every 4 (four) hours as needed. For cough      . lisinopril (PRINIVIL,ZESTRIL) 10 MG tablet Take 1 tablet (10 mg total) by mouth daily.  30 tablet  3  . LORazepam (ATIVAN) 1 MG tablet Take 1 mg by mouth at bedtime as needed. nerves      . lovastatin (MEVACOR) 10 MG tablet Take 10 mg by mouth every morning.       . Nutritional Supplements (ENSURE PO) Take 1 Bottle by mouth daily.       Marland Kitchen omeprazole (PRILOSEC) 20 MG capsule Take 20 mg by mouth daily.      Marland Kitchen oxyCODONE-acetaminophen (PERCOCET/ROXICET) 5-325 MG per tablet Take 0.5-1 tablets by mouth 4 (four) times daily as needed. For pain      . potassium chloride (K-DUR) 10 MEQ tablet Take 10 mEq by mouth daily.      Marland Kitchen PROAIR HFA 108 (90 BASE) MCG/ACT inhaler Inhale 1 puff into the lungs every 6 (six) hours as needed. For shortness of breath      . traMADol (ULTRAM) 50 MG tablet Take 50-100 mg by mouth 4 (four) times daily as needed. For pain      .  warfarin (COUMADIN) 5 MG tablet Take 2.5-5 mg by mouth as directed. Patient takes 1 tablet(5mg ) every day except on Friday patient takes 1/2 tablet(2.5mg )       Scheduled:    . ceFEPime (MAXIPIME) IV  2 g Intravenous Q8H  . heparin  5,000 Units Subcutaneous Q8H  . hydrocortisone sod succinate (SOLU-CORTEF) injection  25 mg Intravenous Q6H  . levofloxacin (LEVAQUIN) IV  750 mg Intravenous Q24H  . metoprolol tartrate  25 mg Oral BID  . pantoprazole  40 mg Oral Daily  . [DISCONTINUED] ceFEPime (MAXIPIME) IV  1 g Intravenous Q24H  . [DISCONTINUED] hydrocortisone sod succinate (SOLU-CORTEF) injection  50 mg Intravenous Q6H  . [DISCONTINUED] levofloxacin (LEVAQUIN) IV  500 mg Intravenous Q48H  . [DISCONTINUED] vancomycin  1,250 mg Intravenous Q12H  . [DISCONTINUED] vancomycin  1,000 mg Intravenous Q24H   Infusions:    . [DISCONTINUED] norepinephrine (LEVOPHED) Adult infusion 5.013 mcg/min (02/01/12 1100)  . [DISCONTINUED] vasopressin (PITRESSIN) infusion - *FOR SHOCK* 0.03  Units/min (01/31/12 1025)    Assessment: 71yo male admitted for leukopenia and sepsis/HCAP, now off pressors and ABX narrowed, to resume Coumadin for Afib with heparin bridge.  Goal of Therapy:  INR 2-3 Heparin level 0.3-0.7 units/ml Monitor platelets by anticoagulation protocol: Yes   Plan:  Rec'd heparin SQ ~1.5hr ago; will begin heparin gtt at 1250 units/hr and monitor heparin levels and CBC; will give boosted dose of Coumadin this evening of 7.5mg  and monitor INR for adjustments.  Colleen Can PharmD BCPS 02/02/2012,6:55 AM

## 2012-02-02 NOTE — Progress Notes (Signed)
ANTICOAGULATION CONSULT NOTE - Follow Up Consult  Pharmacy Consult for heparin and Coumadin Indication: atrial fibrillation  Patient Measurements: Height: 5\' 7"  (170.2 cm) Weight: 206 lb 2.1 oz (93.5 kg) IBW/kg (Calculated) : 66.1  Heparin Dosing Weight: 87kg  Vital Signs: Temp: 98 F (36.7 C) (11/11 1222) Temp src: Oral (11/11 1222) BP: 120/80 mmHg (11/11 1222) Pulse Rate: 83  (11/11 1222)  Labs:  Basename 02/02/12 1516 02/02/12 0500 02/01/12 0500 01/31/12 0100 01/30/12 1728  HGB -- 10.3* 9.9* -- --  HCT -- 29.3* 29.3* 29.9* --  PLT -- 128* 131* 146* --  APTT -- -- -- 50* 54*  LABPROT -- 15.1 -- 21.3* 20.7*  INR -- 1.21 -- 1.93* 1.85*  HEPARINUNFRC 0.35 -- -- -- --  CREATININE -- 0.89 1.02 2.28* --  CKTOTAL -- -- -- -- --  CKMB -- -- -- -- --  TROPONINI -- -- -- -- <0.30    Estimated Creatinine Clearance: 81.8 ml/min (by C-G formula based on Cr of 0.89). Assessment: 71yo male admitted for leukopenia and sepsis/HCAP, now off pressors and ABX narrowed, to resume Coumadin for Afib with heparin bridge. Initial heparin level at goal. No bleeding issues noted. CBC has been stable.  Goal of Therapy:  INR 2-3 Heparin level 0.3-0.7 units/ml Monitor platelets by anticoagulation protocol: Yes   Plan:  Continue heparin gtt at 1250 units/hr Monitor heparin levels and CBC and INR daily Coumadin 7.5 this evening    [name] PharmD BCPS 02/02/2012,5:13 PM

## 2012-02-03 ENCOUNTER — Inpatient Hospital Stay (HOSPITAL_COMMUNITY): Payer: Medicare Other

## 2012-02-03 DIAGNOSIS — R7881 Bacteremia: Secondary | ICD-10-CM | POA: Diagnosis not present

## 2012-02-03 LAB — CBC WITH DIFFERENTIAL/PLATELET
Basophils Absolute: 0 10*3/uL (ref 0.0–0.1)
Eosinophils Absolute: 0 10*3/uL (ref 0.0–0.7)
HCT: 28 % — ABNORMAL LOW (ref 39.0–52.0)
Lymphs Abs: 0.3 10*3/uL — ABNORMAL LOW (ref 0.7–4.0)
MCH: 28.6 pg (ref 26.0–34.0)
MCHC: 34.6 g/dL (ref 30.0–36.0)
MCV: 82.6 fL (ref 78.0–100.0)
Monocytes Absolute: 1 10*3/uL (ref 0.1–1.0)
Neutro Abs: 4.3 10*3/uL (ref 1.7–7.7)
Platelets: 118 10*3/uL — ABNORMAL LOW (ref 150–400)
RDW: 14.7 % (ref 11.5–15.5)
WBC: 5.6 10*3/uL (ref 4.0–10.5)

## 2012-02-03 LAB — COMPREHENSIVE METABOLIC PANEL
AST: 25 U/L (ref 0–37)
Albumin: 2.3 g/dL — ABNORMAL LOW (ref 3.5–5.2)
BUN: 19 mg/dL (ref 6–23)
Calcium: 8.6 mg/dL (ref 8.4–10.5)
Chloride: 106 mEq/L (ref 96–112)
Creatinine, Ser: 0.86 mg/dL (ref 0.50–1.35)
Total Bilirubin: 0.4 mg/dL (ref 0.3–1.2)

## 2012-02-03 LAB — HEPARIN LEVEL (UNFRACTIONATED): Heparin Unfractionated: 0.37 IU/mL (ref 0.30–0.70)

## 2012-02-03 LAB — PROTIME-INR
INR: 1.52 — ABNORMAL HIGH (ref 0.00–1.49)
Prothrombin Time: 17.9 seconds — ABNORMAL HIGH (ref 11.6–15.2)

## 2012-02-03 MED ORDER — PREDNISONE 20 MG PO TABS
40.0000 mg | ORAL_TABLET | Freq: Every day | ORAL | Status: AC
  Administered 2012-02-03: 40 mg via ORAL
  Filled 2012-02-03: qty 2

## 2012-02-03 MED ORDER — WARFARIN SODIUM 7.5 MG PO TABS
7.5000 mg | ORAL_TABLET | Freq: Once | ORAL | Status: AC
  Administered 2012-02-03: 7.5 mg via ORAL
  Filled 2012-02-03: qty 1

## 2012-02-03 MED ORDER — PREDNISONE 20 MG PO TABS: 40.0000 mg | ORAL_TABLET | Freq: Every day | ORAL | Status: DC

## 2012-02-03 MED ORDER — HYDROCODONE-ACETAMINOPHEN 5-325 MG PO TABS
0.5000 | ORAL_TABLET | ORAL | Status: DC | PRN
  Administered 2012-02-03 – 2012-02-04 (×8): 0.5 via ORAL
  Filled 2012-02-03 (×5): qty 1

## 2012-02-03 MED ORDER — PREDNISONE 20 MG PO TABS
30.0000 mg | ORAL_TABLET | Freq: Every day | ORAL | Status: AC
  Administered 2012-02-04: 30 mg via ORAL
  Filled 2012-02-03: qty 1

## 2012-02-03 MED ORDER — PREDNISONE 10 MG PO TABS: 10.0000 mg | ORAL_TABLET | Freq: Every day | ORAL | Status: DC

## 2012-02-03 MED ORDER — PREDNISONE 20 MG PO TABS: 20.0000 mg | ORAL_TABLET | Freq: Every day | ORAL | Status: DC

## 2012-02-03 MED ORDER — DILTIAZEM HCL ER COATED BEADS 240 MG PO CP24
240.0000 mg | ORAL_CAPSULE | Freq: Every day | ORAL | Status: DC
  Administered 2012-02-03 – 2012-02-04 (×2): 240 mg via ORAL
  Filled 2012-02-03 (×2): qty 1

## 2012-02-03 MED ORDER — ENOXAPARIN SODIUM 40 MG/0.4ML ~~LOC~~ SOLN
40.0000 mg | Freq: Every day | SUBCUTANEOUS | Status: DC
  Administered 2012-02-03: 40 mg via SUBCUTANEOUS
  Filled 2012-02-03 (×2): qty 0.4

## 2012-02-03 NOTE — Progress Notes (Signed)
Occupational Therapy Evaluation Patient Details Name: Charles [name] MRN: [id number] DOB: February 16, 1941 Today's Date: 02/03/2012 Time: 9629-5284 OT Time Calculation (min): 29 min  OT Assessment / Plan / Recommendation Clinical Impression  71 yo with septic shock/pneumonia with hx of non hogkins lynphoma. PTA, pt independent with all ADL and functional moiblity for ADL, including driving. Pt will benefit from skilled OT serivces to max independence with ADL and fucntional mobility for ADL to facilitate D/C home with intermittent S of family/friends.     OT Assessment  Patient needs continued OT Services    Follow Up Recommendations  Home health OT;Other (comment) (pending progress)    Barriers to Discharge None    Equipment Recommendations  Tub/shower bench;Rolling walker with 5" wheels;3 in 1 bedside comode;Other (comment) (RW pending ambulation tomorrow)    Recommendations for Other Services  none  Frequency  Min 2X/week    Precautions / Restrictions Precautions Precautions: None   Pertinent Vitals/Pain Pain in R shoulder from previous surgery O2 sats 97 RA. HR 98    ADL  Eating/Feeding: Independent Grooming: Modified independent Where Assessed - Grooming: Unsupported standing Upper Body Bathing: Supervision/safety;Set up Where Assessed - Upper Body Bathing: Unsupported sitting Lower Body Bathing: Supervision/safety;Set up Where Assessed - Lower Body Bathing: Unsupported sit to stand Upper Body Dressing: Supervision/safety;Set up Where Assessed - Upper Body Dressing: Unsupported sitting Lower Body Dressing: Supervision/safety;Set up Where Assessed - Lower Body Dressing: Unsupported sit to stand Toilet Transfer: Minimal assistance Toilet Transfer Method: Sit to stand Toilet Transfer Equipment: Regular height toilet Toileting - Clothing Manipulation and Hygiene: Modified independent Where Assessed - Toileting Clothing Manipulation and Hygiene: Sit to stand from 3-in-1 or  toilet Tub/Shower Transfer: Moderate assistance;Other (comment) (pt "climbs out of shower at home) Equipment Used: Gait belt Transfers/Ambulation Related to ADLs: minguard ADL Comments: did not ambulate this pm due to fatigue    OT Diagnosis: Generalized weakness  OT Problem List: Decreased strength;Decreased activity tolerance;Decreased knowledge of use of DME or AE;Cardiopulmonary status limiting activity OT Treatment Interventions: Self-care/ADL training;Energy conservation;DME and/or AE instruction;Therapeutic activities;Patient/family education   OT Goals Acute Rehab OT Goals OT Goal Formulation: With patient Time For Goal Achievement: 02/10/12 Potential to Achieve Goals: Good ADL Goals Pt Will Perform Lower Body Bathing: with modified independence;Unsupported;Sit to stand from chair ADL Goal: Lower Body Bathing - Progress: Goal set today Pt Will Perform Lower Body Dressing: with modified independence;Sit to stand from chair;Unsupported ADL Goal: Lower Body Dressing - Progress: Goal set today Pt Will Transfer to Toilet: with modified independence;with DME;Ambulation;3-in-1 ADL Goal: Toilet Transfer - Progress: Goal set today Pt Will Perform Tub/Shower Transfer: with supervision;Ambulation;Transfer tub bench ADL Goal: Tub/Shower Transfer - Progress: Goal set today Additional ADL Goal #1: Pt will verbalize 2 E consevation techniques to use during ADL. ADL Goal: Additional Goal #1 - Progress: Goal set today  Visit Information  Last OT Received On: 02/03/12 Assistance Needed: +1    Subjective Data      Prior Functioning     Home Living Lives With: Spouse Available Help at Discharge: Friend(s);Available 24 hours/day Type of Home: House Home Access: Stairs to enter [employer] of Steps: 3 Entrance Stairs-Rails: Left Home Layout: One level Bathroom Shower/Tub: Forensic scientist: Standard Bathroom Accessibility: Yes How Accessible:  Accessible via walker (sideways) Home Adaptive Equipment: Straight cane;Walker - standard Prior Function Level of Independence: Independent;Independent with assistive device(s) (used Faxon if going out) Able to Take Stairs?: Yes Driving: Yes Vocation: Retired Musician: No difficulties  Dominant Hand: Right         Vision/Perception  WFL   Cognition  Overall Cognitive Status: Appears within functional limits for tasks assessed/performed Arousal/Alertness: Awake/alert Orientation Level: Appears intact for tasks assessed Behavior During Session: [facility] for tasks performed    Extremity/Trunk Assessment Right Upper Extremity Assessment RUE ROM/Strength/Tone: Deficits;Due to pain (due to previous rotator cuff surgery) RUE ROM/Strength/Tone Deficits: RTC weakness RUE Sensation: WFL - Light Touch;WFL - Proprioception RUE Coordination: WFL - gross/fine motor Left Upper Extremity Assessment LUE ROM/Strength/Tone: WFL for tasks assessed LUE Sensation: WFL - Light Touch;WFL - Proprioception LUE Coordination: WFL - gross/fine motor Trunk Assessment Trunk Assessment: Normal     Mobility Transfers Transfers: Sit to Stand;Stand to Sit Sit to Stand: 5: Supervision;With upper extremity assist;From chair/3-in-1 Stand to Sit: 5: Supervision;With upper extremity assist;To chair/3-in-1 Details for Transfer Assistance: good safety awareness               Balance  WFL   End of Session OT - End of Session Equipment Utilized During Treatment: Gait belt Activity Tolerance: Patient tolerated treatment well Patient left: in chair;with call bell/phone within reach Nurse Communication: Mobility status  GO     Joelle [place],HILLARY 02/03/2012, 5:29 PM [facility], OTR/L  [phone] 02/03/2012

## 2012-02-03 NOTE — Progress Notes (Signed)
PT Cancellation Note  Patient Details Name: Charles [name] MRN: [id number] DOB: 06-26-1940   Cancelled Treatment:    Reason Eval/Treat Not Completed: Fatigue/lethargy limiting ability to participate. Pt reports he is worn out and was up earlier.   Nalin Mazzocco 02/03/2012, 2:51 PM

## 2012-02-03 NOTE — Progress Notes (Signed)
ANTICOAGULATION CONSULT NOTE - Follow Up Consult  Pharmacy Consult  Coumadin Indication: atrial fibrillation  Patient Measurements: Height: 5\' 7"  (170.2 cm) Weight: 206 lb 2.1 oz (93.5 kg) IBW/kg (Calculated) : 66.1  Heparin Dosing Weight: 87kg  Vital Signs: Temp: 97.7 F (36.5 C) (11/12 0935) Temp src: Oral (11/12 0935) BP: 134/86 mmHg (11/12 0935) Pulse Rate: 110  (11/12 0935)  Labs:  Basename 02/03/12 0540 02/02/12 1516 02/02/12 0500 02/01/12 0500  HGB 9.7* -- 10.3* --  HCT 28.0* -- 29.3* 29.3*  PLT 118* -- 128* 131*  APTT -- -- -- --  LABPROT 17.9* -- 15.1 --  INR 1.52* -- 1.21 --  HEPARINUNFRC 0.37 0.35 -- --  CREATININE 0.86 -- 0.89 1.02  CKTOTAL -- -- -- --  CKMB -- -- -- --  TROPONINI -- -- -- --    Estimated Creatinine Clearance: 84.7 ml/min (by C-G formula based on Cr of 0.86). Assessment: 71yo male admitted for leukopenia and sepsis/HCAP. Coumadin resumed with heparin bridge for Afib on 11/11. Today heparin was stopped. To start LWMH 40 qday until INR > = 2. INR = 1.52 after 1 dose of 7.5 mg yesterday. Home coumadin dose is 5mg  daily x 2.5 mg on Fridays. He goes to the [facility] coumadin clinic. His INR was in the 4s last week and dose was held x 2 days.  Goal of Therapy:  INR 2-3   Plan:  1. Heparin dc'd by MD  2. LMWH 40 qday until INR > = 2 3. Repeat coumadin 7.5 mg x1 4. Daily INR Herby Abraham, Pharm.D. [phone] 02/03/2012 9:52 AM

## 2012-02-03 NOTE — Progress Notes (Addendum)
[facility]             Progress Note PCCM Transfer 11/12 Brief narrative Mr.Schutter is a 71/M with lymphoma presented to [facility] ED 11/8 with septic shock, RLL pneumonia, pseudomonas bacteremia required pressor support and improved then transferred [facility] 11/12   Assessment/Plan: 1. Septic shock/ Pseudomonas pneumonia Required pressor support for 48hours Pseudomonas bacteremia Improved, was on Vanc/Cefepime/Levaquin Day4  I discussed with ID Dr.Hatcher who recommended 10days of IV levaquin OR Cefepime monotherapy,  followed by 1 week of PO levaquin Transition to IV levaquin monotherapy today Repeat blood Cx drawn 11/10 negative so far Will need surveillance blood cultures after completing abx course Will cut down stress dose steroids and start Prednisone taper Will need PICC line prior to DC  2. Pseudomonas pneumonia/bacteremia, most likely aspiration pneumonia As noted above  3. P.afib: rate Up, resume home Diltiazem , keep on Tele, continue couamdin DC IV heparin, does not need full briding for P.Afib lovenox 40mg  SQ daily till INR >2  4. Acute renal failure: resolved  5. 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 Non hodgkins lymphoma: on maintenance rituxan, Fu with Dr.Neijstrom  DVT proph: on coumadin/lovenox FULL CODE DIspo: inpatient  Pt/Ot ambulate   Time spent coordinating care:   LOS: 4 days   Yaniah Thiemann [facility] Pager: [phone] 02/03/2012, 10:33 AM    Subjective: Doing well, breathing ok, c/o chronic arthirtis pain in his knees  Objective: Vital signs in last 24 hours: Temp:  [97.7 F (36.5 C)-98.2 F (36.8 C)] 97.7 F (36.5 C) (11/12 0935) Pulse Rate:  [83-110] 110  (11/12 0935) Resp:  [20-22] 20  (11/12 0935) BP: (120-142)/(67-86) 134/86 mmHg (11/12 0935) SpO2:  [96 %-100 %] 96 % (11/12 0935) Weight change:  Last BM Date: 02/02/12  Intake/Output from previous day: 11/11  0701 - 11/12 0700 In: 300 [P.O.:240; I.V.:60] Out: 1150 [Urine:1150] Total I/O In: 240 [P.O.:240] Out: 650 [Urine:650]   Physical Exam: General: Alert, awake, oriented x3, in no acute distress. HEENT: No bruits, no goiter. Heart: Regular rate and rhythm, without murmurs, rubs, gallops. Lungs: ronchi at R base Abdomen: Soft, nontender, nondistended, positive bowel sounds. Extremities: No clubbing cyanosis or edema with positive pedal pulses. Neuro: Grossly intact, nonfocal.    Lab Results: Basic Metabolic Panel:  Basename 02/03/12 0540 02/02/12 0500  NA 139 137  K 3.4* 3.7  CL 106 105  CO2 24 23  GLUCOSE 113* 105*  BUN 19 23  CREATININE 0.86 0.89  CALCIUM 8.6 8.8  MG -- --  PHOS -- --   Liver Function Tests:  Basename 02/03/12 0540  AST 25  ALT 18  ALKPHOS 80  BILITOT 0.4  PROT 5.7*  ALBUMIN 2.3*   No results found for this basename: LIPASE:2,AMYLASE:2 in the last 72 hours No results found for this basename: AMMONIA:2 in the last 72 hours CBC:  Basename 02/03/12 0540 02/02/12 0500  WBC 5.6 3.6*  NEUTROABS 4.3 --  HGB 9.7* 10.3*  HCT 28.0* 29.3*  MCV 82.6 82.8  PLT 118* 128*   Cardiac Enzymes: No results found for this basename: CKTOTAL:3,CKMB:3,CKMBINDEX:3,TROPONINI:3 in the last 72 hours BNP: No results found for this basename: PROBNP:3 in the last 72 hours D-Dimer: No results found for this basename: DDIMER:2 in the last 72 hours CBG:  Basename 02/02/12 2049  GLUCAP 96   Hemoglobin A1C: No results found for this basename: HGBA1C in the last 72 hours Fasting Lipid Panel: No results found  for this basename: CHOL,HDL,LDLCALC,TRIG,CHOLHDL,LDLDIRECT in the last 72 hours Thyroid Function Tests: No results found for this basename: TSH,T4TOTAL,FREET4,T3FREE,THYROIDAB in the last 72 hours Anemia Panel: No results found for this basename: VITAMINB12,FOLATE,FERRITIN,TIBC,IRON,RETICCTPCT in the last 72 hours Coagulation:  Basename 02/03/12 0540  02/02/12 0500  LABPROT 17.9* 15.1  INR 1.52* 1.21   Urine Drug Screen: Drugs of Abuse  No results found for this basename: labopia, cocainscrnur, labbenz, amphetmu, thcu, labbarb    Alcohol Level: No results found for this basename: ETH:2 in the last 72 hours Urinalysis: No results found for this basename: COLORURINE:2,APPERANCEUR:2,LABSPEC:2,PHURINE:2,GLUCOSEU:2,HGBUR:2,BILIRUBINUR:2,KETONESUR:2,PROTEINUR:2,UROBILINOGEN:2,NITRITE:2,LEUKOCYTESUR:2 in the last 72 hours  Recent Results (from the past 240 hour(s))  CULTURE, BLOOD (ROUTINE X 2)     Status: Normal   Collection Time   01/30/12  5:27 PM      Component Value Range Status Comment   Specimen Description BLOOD RIGHT ANTECUBITAL   Final    Special Requests BOTTLES DRAWN AEROBIC AND ANAEROBIC 14CC   Final    Culture  Setup Time 01/31/2012 21:46   Final    Culture     Final    Value: PSEUDOMONAS AERUGINOSA     Note: CRITICAL RESULT CALLED TO, READ BACK BY AND VERIFIED WITH: JAMES DUNLOP 9:35AM 02/01/12 BY RUSCA.   Report Status 02/02/2012 FINAL   Final    Organism ID, Bacteria PSEUDOMONAS AERUGINOSA   Final   CULTURE, BLOOD (ROUTINE X 2)     Status: Normal   Collection Time   01/30/12  6:05 PM      Component Value Range Status Comment   Specimen Description BLOOD LEFT WRIST   Final    Special Requests BOTTLES DRAWN AEROBIC AND ANAEROBIC [facility]   Final    Culture  Setup Time 01/31/2012 21:46   Final    Culture     Final    Value: PSEUDOMONAS AERUGINOSA     Note: SUSCEPTIBILITIES PERFORMED ON PREVIOUS CULTURE WITHIN THE LAST 5 DAYS.     Note: CRITICAL RESULT CALLED TO, READ BACK BY AND VERIFIED WITH: JAMES DUNLOP 02/01/12 @ 9:35AM BY RUSCA.   Report Status 02/02/2012 FINAL   Final   URINE CULTURE     Status: Normal   Collection Time   01/30/12  7:29 PM      Component Value Range Status Comment   Specimen Description URINE, CATHETERIZED   Final    Special Requests NONE   Final    Culture  Setup Time 01/31/2012 21:57   Final     Colony Count NO GROWTH   Final    Culture NO GROWTH   Final    Report Status 02/01/2012 FINAL   Final   MRSA PCR SCREENING     Status: Normal   Collection Time   01/30/12 11:41 PM      Component Value Range Status Comment   MRSA by PCR NEGATIVE  NEGATIVE Final   CULTURE, BLOOD (ROUTINE X 2)     Status: Normal (Preliminary result)   Collection Time   01/31/12  1:00 AM      Component Value Range Status Comment   Specimen Description BLOOD   Final    Special Requests     Final    Value: RIGHT IJ BOTTLES DRAWN AEROBIC AND ANAEROBIC 10CC EACH   Culture  Setup Time 01/31/2012 04:02   Final    Culture     Final    Value:        BLOOD CULTURE RECEIVED NO GROWTH TO DATE  CULTURE WILL BE HELD FOR 5 DAYS BEFORE ISSUING A FINAL NEGATIVE REPORT   Report Status PENDING   Incomplete   CULTURE, BLOOD (ROUTINE X 2)     Status: Normal (Preliminary result)   Collection Time   01/31/12  1:00 AM      Component Value Range Status Comment   Specimen Description BLOOD LEFT ARM   Final    Special Requests BOTTLES DRAWN AEROBIC ONLY 10CC   Final    Culture  Setup Time 01/31/2012 04:02   Final    Culture     Final    Value:        BLOOD CULTURE RECEIVED NO GROWTH TO DATE CULTURE WILL BE HELD FOR 5 DAYS BEFORE ISSUING A FINAL NEGATIVE REPORT   Report Status PENDING   Incomplete   CULTURE, EXPECTORATED SPUTUM-ASSESSMENT     Status: Normal   Collection Time   02/01/12  6:02 AM      Component Value Range Status Comment   Specimen Description SPUTUM   Final    Special Requests NONE   Final    Sputum evaluation     Final    Value: MICROSCOPIC FINDINGS SUGGEST THAT THIS SPECIMEN IS NOT REPRESENTATIVE OF LOWER RESPIRATORY SECRETIONS. PLEASE RECOLLECT.     CALLED TO Marcene Brawn RN AT [phone] 02/01/12 MITCHELL,L   Report Status 02/01/2012 FINAL   Final     Studies/Results: Dg Chest Port 1 View  02/03/2012  *RADIOLOGY REPORT*  Clinical Data: Evaluate the right base infiltrate.  PORTABLE CHEST - 1 VIEW  Comparison:  02/01/2012.  Findings: Right central line has been removed.  Left Mediport catheter remains in place with the tip projecting at the level of the distal left bracheocephalic vein.  No gross pneumothorax.  Persistent consolidation of right mid to lower lung zone may represent an infectious infiltrate in the proper clinical setting. Atelectasis may contribute to this appearance.  Recommend follow-up until clearance.  Appearance unchanged.  Cardiomegaly.  Pulmonary vascular congestion.  Slightly tortuous aorta.  IMPRESSION: Right central line has been removed.  Persistent consolidation right mid to lower lung.  Cardiomegaly and pulmonary vascular congestion.   Original Report Authenticated By: Lacy Duverney, M.D.     Medications: Scheduled Meds:   . ceFEPime (MAXIPIME) IV  2 g Intravenous Q8H  . diltiazem  240 mg Oral Daily  . enoxaparin (LOVENOX) injection  40 mg Subcutaneous Daily  . levofloxacin (LEVAQUIN) IV  750 mg Intravenous Q24H  . metoprolol tartrate  25 mg Oral BID  . pantoprazole  40 mg Oral Daily  . predniSONE  40 mg Oral Q lunch   Followed by  . predniSONE  30 mg Oral Q lunch   Followed by  . predniSONE  20 mg Oral Q lunch   Followed by  . predniSONE  10 mg Oral Q lunch  . [COMPLETED] warfarin  7.5 mg Oral ONCE-1800  . warfarin  7.5 mg Oral ONCE-1800  . Warfarin - Pharmacist Dosing Inpatient   Does not apply q1800  . [DISCONTINUED] hydrocortisone sod succinate (SOLU-CORTEF) injection  25 mg Intravenous Q6H  . [DISCONTINUED] predniSONE  40 mg Oral Daily   Continuous Infusions:   . [DISCONTINUED] heparin Stopped (02/03/12 0902)   PRN Meds:.sodium chloride, albuterol, ALPRAZolam, HYDROcodone-acetaminophen, ondansetron (ZOFRAN) IV, traMADol, [DISCONTINUED] HYDROcodone-acetaminophen

## 2012-02-04 DIAGNOSIS — B965 Pseudomonas (aeruginosa) (mallei) (pseudomallei) as the cause of diseases classified elsewhere: Secondary | ICD-10-CM

## 2012-02-04 DIAGNOSIS — R7881 Bacteremia: Secondary | ICD-10-CM

## 2012-02-04 LAB — CBC
HCT: 27.4 % — ABNORMAL LOW (ref 39.0–52.0)
Hemoglobin: 9.6 g/dL — ABNORMAL LOW (ref 13.0–17.0)
MCV: 82.5 fL (ref 78.0–100.0)
RBC: 3.32 MIL/uL — ABNORMAL LOW (ref 4.22–5.81)
RDW: 14.8 % (ref 11.5–15.5)
WBC: 6.7 10*3/uL (ref 4.0–10.5)

## 2012-02-04 LAB — BASIC METABOLIC PANEL
BUN: 15 mg/dL (ref 6–23)
CO2: 27 mEq/L (ref 19–32)
Chloride: 107 mEq/L (ref 96–112)
Creatinine, Ser: 0.75 mg/dL (ref 0.50–1.35)
Glucose, Bld: 116 mg/dL — ABNORMAL HIGH (ref 70–99)

## 2012-02-04 MED ORDER — POTASSIUM CHLORIDE 20 MEQ/15ML (10%) PO LIQD
40.0000 meq | Freq: Once | ORAL | Status: AC
  Administered 2012-02-04: 40 meq via ORAL
  Filled 2012-02-04: qty 30

## 2012-02-04 MED ORDER — GUAIFENESIN-DM 100-10 MG/5ML PO SYRP
10.0000 mL | ORAL_SOLUTION | ORAL | Status: DC | PRN
  Administered 2012-02-04: 10 mL via ORAL
  Filled 2012-02-04: qty 10

## 2012-02-04 MED ORDER — PREDNISONE 10 MG PO TABS: ORAL_TABLET | ORAL | Status: DC

## 2012-02-04 MED ORDER — CIPROFLOXACIN HCL 750 MG PO TABS: 750.0000 mg | ORAL_TABLET | Freq: Two times a day (BID) | ORAL | Status: DC

## 2012-02-04 MED ORDER — LEVOFLOXACIN 750 MG PO TABS
750.0000 mg | ORAL_TABLET | Freq: Every day | ORAL | Status: DC
  Administered 2012-02-04: 750 mg via ORAL
  Filled 2012-02-04: qty 1

## 2012-02-04 MED ORDER — WARFARIN SODIUM 5 MG PO TABS
5.0000 mg | ORAL_TABLET | Freq: Once | ORAL | Status: AC
  Administered 2012-02-04: 5 mg via ORAL
  Filled 2012-02-04: qty 1

## 2012-02-04 MED ORDER — GUAIFENESIN-DM 100-10 MG/5ML PO SYRP: ORAL_SOLUTION | ORAL | Status: DC

## 2012-02-04 NOTE — Progress Notes (Signed)
Physical Therapy Evaluation Patient Details Name: Charles [name] MRN: [id number] DOB: 10-11-1940 Today's Date: 02/04/2012 Time: 7829-5621 PT Time Calculation (min): 24 min  PT Assessment / Plan / Recommendation Clinical Impression  Pt is 71 yo male with lymphoma and sepsis who is mobilizing safely but becomes very fatigued with exertion, especially stairs with HR up to 199 bpm at which point, pt very fatigued and no longer safe with mobility.  Recommend acute PT to help increase activity tolerance and independent mobility as well as HHPT at d/c.    PT Assessment  Patient needs continued PT services    Follow Up Recommendations  Home health PT    Does the patient have the potential to tolerate intense rehabilitation      Barriers to Discharge None      Equipment Recommendations  Tub/shower bench;Rolling walker with 5" wheels;3 in 1 bedside comode;Other (comment)    Recommendations for Other Services     Frequency Min 3X/week    Precautions / Restrictions Precautions Precautions: Other (comment) (watch HR) Restrictions Weight Bearing Restrictions: No   Pertinent Vitals/Pain HR up to 199 bpm with stairs, decreased with rest to 157 and continuing to descend      Mobility  Bed Mobility Bed Mobility: Not assessed (pt up in the bathroom, able to get there on his own) Transfers Transfers: Sit to Stand;Stand to Sit Sit to Stand: From toilet;5: Supervision Stand to Sit: 5: Supervision;To chair/3-in-1 Details for Transfer Assistance: pt transfers safely to and from toilet and chair Ambulation/Gait Ambulation/Gait Assistance: 5: Supervision Ambulation Distance (Feet): 200 Feet Assistive device: Other (Comment) (used IV pole since he did not have his cane) Ambulation/Gait Assistance Details: antalgic pattern noted and pt reported fatigue in legs with increased distance ambulating Gait Pattern: Step-through pattern;Antalgic Stairs: Yes Stairs Assistance: 4: Min guard Stair  Management Technique: One rail Left;Forwards Number of Stairs: 3  Wheelchair Mobility Wheelchair Mobility: No    Shoulder Instructions     Exercises     PT Diagnosis: Generalized weakness;Difficulty walking  PT Problem List: Decreased activity tolerance;Decreased mobility;Decreased strength PT Treatment Interventions: DME instruction;Gait training;Stair training;Functional mobility training;Therapeutic activities;Therapeutic exercise;Patient/family education   PT Goals Acute Rehab PT Goals PT Goal Formulation: With patient Time For Goal Achievement: 02/18/12 Potential to Achieve Goals: Good Pt will Ambulate: >150 feet;with modified independence;with least restrictive assistive device;Other (comment) (with HR <130) PT Goal: Ambulate - Progress: Goal set today Pt will Go Up / Down Stairs: 3-5 stairs;with rail(s);with modified independence PT Goal: Up/Down Stairs - Progress: Goal set today Pt will Perform Home Exercise Program: Independently PT Goal: Perform Home Exercise Program - Progress: Goal set today  Visit Information  Last PT Received On: 02/04/12 Assistance Needed: +1    Subjective Data  Subjective: I need to go home today Patient Stated Goal: return home ASAP   Prior Functioning  Home Living Lives With: Spouse Available Help at Discharge: Family;Friend(s);Available 24 hours/day Type of Home: House Home Access: Stairs to enter [employer] of Steps: 3 Entrance Stairs-Rails: Left Home Layout: One level Bathroom Shower/Tub: Forensic scientist: Standard Bathroom Accessibility: Yes How Accessible: Accessible via walker Home Adaptive Equipment: Straight cane;Walker - standard Additional Comments: pt uses his cane as needed due to LE arthritis Prior Function Level of Independence: Independent;Independent with assistive device(s) Able to Take Stairs?: Yes Driving: Yes Vocation: Retired Musician: No  difficulties Dominant Hand: Right    Cognition  Overall Cognitive Status: Appears within functional limits for tasks assessed/performed Arousal/Alertness: Awake/alert  Orientation Level: Appears intact for tasks assessed Behavior During Session: [facility] for tasks performed    Extremity/Trunk Assessment Right Lower Extremity Assessment RLE ROM/Strength/Tone: Deficits RLE ROM/Strength/Tone Deficits: 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 with 4/5 strength throughout and antalgia noted with gait RLE Sensation: WFL - Light Touch;WFL - Proprioception RLE Coordination: WFL - gross motor Left Lower Extremity Assessment LLE ROM/Strength/Tone: Deficits LLE ROM/Strength/Tone Deficits: see RLE note LLE Sensation: WFL - Light Touch;WFL - Proprioception LLE Coordination: WFL - gross motor Trunk Assessment Trunk Assessment: Normal   Balance Balance Balance Assessed: Yes Dynamic Standing Balance Dynamic Standing - Balance Support: No upper extremity supported;During functional activity Dynamic Standing - Level of Assistance: 6: Modified independent (Device/Increase time)  End of Session PT - End of Session Equipment Utilized During Treatment: Gait belt Activity Tolerance: Patient limited by fatigue;Other (comment) (limited by increased HR) Patient left: in chair;with call bell/phone within reach Nurse Communication: Mobility status;Other (comment) (discussed HR elevation)  GP   Lyanne Co, PT  Acute Rehab Services  [phone]   Lyanne Co 02/04/2012, 12:58 PM

## 2012-02-04 NOTE — Progress Notes (Signed)
Occupational Therapy Treatment Patient Details Name: Charles [name] MRN: [id number] DOB: 07-23-1940 Today's Date: 02/04/2012 Time: 3086-5784 OT Time Calculation (min): 10 min  OT Assessment / Plan / Recommendation Comments on Treatment Session Pt made excellent progress. Rec HHOT as pt will be home at times during the day while wife works. Pt/wife verbalize understanding regarding DME recommendations.    Follow Up Recommendations  Home health OT;Other (comment)    Barriers to Discharge       Equipment Recommendations  Tub/shower bench;Rolling walker with 5" wheels;3 in 1 bedside comode;Other (comment)    Recommendations for Other Services    Frequency Min 2X/week   Plan Discharge plan remains appropriate    Precautions / Restrictions Precautions Precautions: None Restrictions Weight Bearing Restrictions: No   Pertinent Vitals/Pain No c/o pain   ADL  Transfers/Ambulation Related to ADLs: 's ADL Comments: Educated pt/wife on recommendations for tub bench and grab bars in shower to increase safety and independence. Educated on E conservation  tehniques    OT Diagnosis:    OT Problem List:   OT Treatment Interventions:     OT Goals Acute Rehab OT Goals OT Goal Formulation: With patient Time For Goal Achievement: 02/10/12 Potential to Achieve Goals: Good ADL Goals Pt Will Perform Lower Body Bathing: with modified independence;Unsupported;Sit to stand from chair ADL Goal: Lower Body Bathing - Progress: Progressing toward goals Pt Will Perform Lower Body Dressing: with modified independence;Sit to stand from chair;Unsupported ADL Goal: Lower Body Dressing - Progress: Progressing toward goals Pt Will Transfer to Toilet: with modified independence;with DME;Ambulation;3-in-1 ADL Goal: Toilet Transfer - Progress: Progressing toward goals Pt Will Perform Tub/Shower Transfer: with supervision;Ambulation;Transfer tub bench ADL Goal: Tub/Shower Transfer - Progress: Progressing  toward goals Additional ADL Goal #1: Pt will verbalize 2 E consevation techniques to use during ADL. ADL Goal: Additional Goal #1 - Progress: Progressing toward goals  Visit Information  Last OT Received On: 02/04/12 Assistance Needed: +1    Subjective Data      Prior Functioning  Home Living Lives With: Spouse Available Help at Discharge: Family;Friend(s);Available 24 hours/day Type of Home: House Home Access: Stairs to enter [employer] of Steps: 3 Entrance Stairs-Rails: Left Home Layout: One level Bathroom Shower/Tub: Forensic scientist: Standard Bathroom Accessibility: Yes How Accessible: Accessible via walker Home Adaptive Equipment: Straight cane;Walker - standard Additional Comments: pt uses his cane as needed due to LE arthritis Prior Function Level of Independence: Independent;Independent with assistive device(s) Able to Take Stairs?: Yes Driving: Yes Vocation: Retired Musician: No difficulties Dominant Hand: Right    Cognition  Overall Cognitive Status: Appears within functional limits for tasks assessed/performed Arousal/Alertness: Awake/alert Orientation Level: Appears intact for tasks assessed Behavior During Session: [facility] for tasks performed    Mobility  Shoulder Instructions Bed Mobility Bed Mobility: Supine to Sit Supine to Sit: 6: Modified independent (Device/Increase time) Transfers Transfers: Sit to Stand;Stand to Sit Sit to Stand: 5: Supervision Stand to Sit: 5: Supervision Details for Transfer Assistance: pt transfers safely to and from toilet and chair       Exercises      Balance Balance Balance Assessed: Yes Dynamic Standing Balance Dynamic Standing - Balance Support: No upper extremity supported;During functional activity Dynamic Standing - Level of Assistance: 6: Modified independent (Device/Increase time)   End of Session OT - End of Session Activity Tolerance: Patient  tolerated treatment well Patient left: in bed;with call bell/phone within reach;with family/visitor present;with nursing in room Nurse Communication: Mobility status  GO     Juanjose Mojica,HILLARY 02/04/2012, 4:24 PM [facility], OTR/L  [phone] 02/04/2012

## 2012-02-04 NOTE — Progress Notes (Addendum)
ANTICOAGULATION CONSULT NOTE - Follow Up Consult  Pharmacy Consult  Coumadin/levaquin Indication: atrial fibrillation/bacteremia  Patient Measurements: Height: 5\' 7"  (170.2 cm) Weight: 214 lb 8 oz (97.297 kg) IBW/kg (Calculated) : 66.1  Heparin Dosing Weight: 87kg  Vital Signs: Temp: 97.9 F (36.6 C) (11/13 0514) Temp src: Oral (11/13 0514) BP: 124/69 mmHg (11/13 0514) Pulse Rate: 101  (11/13 0514)  Labs:  Basename 02/04/12 0605 02/03/12 0540 02/02/12 1516 02/02/12 0500  HGB 9.6* 9.7* -- --  HCT 27.4* 28.0* -- 29.3*  PLT 119* 118* -- 128*  APTT -- -- -- --  LABPROT 22.1* 17.9* -- 15.1  INR 2.03* 1.52* -- 1.21  HEPARINUNFRC -- 0.37 0.35 --  CREATININE 0.75 0.86 -- 0.89  CKTOTAL -- -- -- --  CKMB -- -- -- --  TROPONINI -- -- -- --    Estimated Creatinine Clearance: 92.8 ml/min (by C-G formula based on Cr of 0.75). Assessment: 71yo male admitted for leukopenia and sepsis/HCAP. Coumadin resumed with heparin bridge for Afib on 11/11.  INR = 2 today after 2 doses of coumadin 7.5mg . On D5 abx for pseudomonas bacteremia.   Goal of Therapy:  INR 2-3   Plan:  1. Coumadin 5mg  PO x1 2. Levaquin 750mg  IV q24 3. Dc lovenox (If home today use coumadin 5mg  qday except 2.5mg  TTS while on abx)

## 2012-02-04 NOTE — Progress Notes (Signed)
Pt's heart rate increased to 178 sustaining and 199 non sustaining while ambulating up the stairs with the physical therapist. Pt c/o some sob but no decrease in o2 sats. Pt sat down to rest and heart rate returned to normal rate. No severe distress noted. MD notified no new orders received. Will cont to monitor.

## 2012-02-04 NOTE — Progress Notes (Signed)
Pt discharged to home after visit summary reviewed and pt capable of re verbalizing medications and follow up appointments. Pt remains stable. No signs and symptoms of distress. Educated to return to ER in the event of SOB, dizziness, chest pain, or fainting. Kona Yusuf, RN   

## 2012-02-04 NOTE — Progress Notes (Signed)
   CARE MANAGEMENT NOTE 02/04/2012  Patient:  Charles [name], Charles [name]   Account Number:  [id number]  Date Initiated:  02/04/2012  Documentation initiated by:  Darlyne Russian  Subjective/Objective Assessment:   Patient admitted with septic shock, HCAP     Action/Plan:   Progression of care and discharge planning   Anticipated DC Date:  02/04/2012   Anticipated DC Plan:  HOME W HOME HEALTH SERVICES      DC Planning Services  CM consult      [facility] Choice  HOME HEALTH   Choice offered to / List presented to:  C-1 Patient        HH arranged  HH-3 OT  HH-2 PT      HH agency  [facility]   Status of service:  Completed, signed off Medicare Important Message given?   (If response is "NO", the following Medicare IM given date fields will be blank) Date Medicare IM given:   Date Additional Medicare IM given:    Discharge Disposition:  HOME W HOME HEALTH SERVICES  Per UR Regulation:    If discussed at Long Length of Stay Meetings, dates discussed:    Comments:  02/04/2012  1200  Darlyne Russian RN, [place]  [phone] CM referral:  PT, OT.  RW, tub transfer bench, 3:1  Met with patient to discuss discharge planning and home health referral for PT/OT and DME.  He selected [facility] for home health PT/OT and states he has at home RW, tub transfer bench and 3:1, does not need.  [facility] Corrie Dandy called with referral for home PT/OT.

## 2012-02-04 NOTE — Discharge Summary (Signed)
Physician Discharge Summary  Charles [name] HYQ:[id number] DOB: 09-07-1940 DOA: 01/30/2012  PCP: [name], MD  Admit date: 01/30/2012 Discharge date: 02/04/2012  Recommendations for Outpatient Follow-up:  1. Pt will need to follow up with PCP in 1 week post discharge 2. Go to [facility] for INR check on 02/06/12  Discharge Diagnoses:  1. Septic shock/ Pseudomonas pneumonia and bacteremia Required pressor support for 48hours  Pseudomonas bacteremia  Improved, was on Vanc/Cefepime/Levaquin Day4  -Continue ciprofloxacin 750 mg twice a day x12 days; this has 100% bioavailability and the patient is able to take oral medications without difficulty Repeat blood Cx drawn 11/09 negative so far  Will need surveillance blood cultures after completing abx course  Will cut down stress dose steroids and start Prednisone taper  2. Pseudomonas pneumonia/bacteremia  As noted above  3. A.afib:  -Restart Cardizem home dose as well as carvedilol. - continue Coumadin. Followup Coumadin clinic at [facility] 02/06/2012 4. Acute renal failure: resolved  5. 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 Non hodgkins lymphoma: on maintenance rituxan, Fu with Dr.Neijstrom   Discharge Condition: stable  Disposition: d/c home  Diet:cardiac Wt Readings from Last 3 Encounters:  02/03/12 97.297 kg (214 lb 8 oz)  12/09/11 96.163 kg (212 lb)  11/30/11 94.348 kg (208 lb)    History of present illness:   71 y.o. male with lymphoma treated last 2 months ago with maintenance Rituximab. Transferred for hypotension and fevers. He presented to [facility] ED with 24 hours of fevers chills and rigors. Contacted MD and advised to come to the ED. He has generalized weakness. He has had one episode of nausea and emesis on the day of admission. 1 week ago he was diagnosed with bronchitis with cough and sputum production. He was treated with a course of levaquin which he  completed a few days ago. He still has a cough but less sputum production.  Hospital Course:  The patient was found to be hypotensive in the emergency department. 3.5 L of normal saline was given. Ultimately, the patient was started on norepinephrine and transferred to the intensive care unit. The patient was started on empiric vancomycin and Zosyn. The vancomycin was discontinued on 02/02/2012. The patient was continued on Levaquin and cefepime. The patient was found to have Pseudomonas bacteria. In addition, he had a right lower lobe infiltrate on his chest x-ray suggestive of pneumonia. Repeat blood cultures on 01/31/2012 are negative on the date of discharge. Unfortunately, sputum cultures were not obtained due to the poor specimen. The patient will be started on ciprofloxacin 750 mg twice a day at home for 12 additional days. This will mark 15 days of antibiotic therapy. Ciprofloxacin has 100% bioavailability, and the patient is able to take oral medications without any problems. In the intensive care unit, a central line was placed. His home antihypertensive medications were held. The patient was continued on intravenous fluids. The patient was found to be in acute renal failure. His serum creatinine peaked at 2.69. On the day of discharge, his creatinine was 0.75. Once the patient was stabilized and his blood pressure improved, the patient was moved to the regular medical floor on 02/02/2012. Central line was discontinued. The patient's atrial fibrillation was rate controlled. The patient was started on short-acting metoprolol. The patient's blood pressure continued to improve. Cardizem CD 240 mg was restarted. The patient was instructed to restart his carvedilol at home. The patient was started on intravenous stress steroids.  This was weaned to oral steroids. He will be discharged home with prednisone 20 mg on November 14, 10 mg on November 15. The patient participated with physical therapy and  occupational therapy. These will be set up for home. The patient was restarted on his warfarin. On the day of discharge, his INR was 2.03. The patient was instructed to followup at the Coumadin clinic at any time on Friday, 02/06/2012. In addition, the patient was instructed to not restart his lisinopril, Lasix, and potassium until he follows up with his primary care doctor.  Consultants: Critical care  Discharge Exam: Filed Vitals:   02/04/12 1032  BP: 150/83  Pulse: 117  Temp: 97.7 F (36.5 C)  Resp: 20   Filed Vitals:   02/03/12 1400 02/03/12 2119 02/04/12 0514 02/04/12 1032  BP: 126/78 131/85 124/69 150/83  Pulse: 106 98 101 117  Temp: 98 F (36.7 C) 98.3 F (36.8 C) 97.9 F (36.6 C) 97.7 F (36.5 C)  TempSrc: Oral Oral Oral Oral  Resp: 20 20 18 20   Height:      Weight:  97.297 kg (214 lb 8 oz)    SpO2: 96% 97% 95% 98%   General: A&O, NAD, pleasant, cooperative Cardiovascular: RRR, no rub, no gallop, no S3 Respiratory: Basilar crackles, right greater than left. no wheezes or rhonchi. Good air movement Abdomen:soft, nontender, nondistended, positive bowel sounds;  Extremities: trace edema, No lymphangitis, no petechiae  Discharge Instructions  Discharge Orders    Future Appointments: Provider: Department: Dept Phone: Center:   02/11/2012 10:10 AM [facility] [phone] None   02/13/2012 10:00 AM Ellouise Newer, PA [facility] [phone] None   03/02/2012 8:45 AM [facility] [phone] None   03/03/2012 11:30 AM [facility] [phone] None     Future Orders Please Complete By Expires   Diet - low sodium heart healthy      Increase activity slowly      Discharge instructions      Comments:   Take same coumadin dose prior to admission.  Go to [facility] to get INR/Coumadin checked on Friday 02/06/12.  They will advise on further coumadin  dosing. Take Ciprofloxacin 750mg  twice a day x 12 more days Do NOT take lisinopril, potassium or lasix until you follow up with your primary care doctor. Prednisone 2 tablets (20mg ) on 02/05/12, prednisone 1 tablet (10mg ) on 02/06/12.       Medication List     As of 02/04/2012 12:23 PM    STOP taking these medications         furosemide 40 MG tablet   Commonly known as: LASIX      lisinopril 10 MG tablet   Commonly known as: PRINIVIL,ZESTRIL      potassium chloride 10 MEQ tablet   Commonly known as: K-DUR      TAKE these medications         ALPRAZolam 1 MG tablet   Commonly known as: XANAX   Take 0.5 mg by mouth 4 (four) times daily.      carvedilol 25 MG tablet   Commonly known as: COREG   Take 25 mg by mouth 2 (two) times daily.      ciprofloxacin 750 MG tablet   Commonly known as: CIPRO   Take 1 tablet (750 mg total) by mouth 2 (two) times daily.      diltiazem 240 MG 24  hr capsule   Commonly known as: CARDIZEM CD   Take 240 mg by mouth daily.      ENSURE PO   Take 1 Bottle by mouth daily.      HYDROcodone-homatropine 5-1.5 MG/5ML syrup   Commonly known as: HYCODAN   Take 5 mLs by mouth every 4 (four) hours as needed. For cough      LORazepam 1 MG tablet   Commonly known as: ATIVAN   Take 1 mg by mouth at bedtime as needed. nerves      lovastatin 10 MG tablet   Commonly known as: MEVACOR   Take 10 mg by mouth every morning.      omeprazole 20 MG capsule   Commonly known as: PRILOSEC   Take 20 mg by mouth daily.      oxyCODONE-acetaminophen 5-325 MG per tablet   Commonly known as: PERCOCET/ROXICET   Take 0.5-1 tablets by mouth 4 (four) times daily as needed. For pain      predniSONE 10 MG tablet   Commonly known as: DELTASONE   Take 2 tablets (20mg ) on 02/05/12.  Take one tablet (10mg ) on 02/06/12.      PROAIR HFA 108 (90 BASE) MCG/ACT inhaler   Generic drug: albuterol   Inhale 1 puff into the lungs every 6 (six) hours as needed. For shortness  of breath      traMADol 50 MG tablet   Commonly known as: ULTRAM   Take 50-100 mg by mouth 4 (four) times daily as needed. For pain      warfarin 5 MG tablet   Commonly known as: COUMADIN   Take 2.5-5 mg by mouth as directed. Patient takes 1 tablet(5mg ) every day except on Friday patient takes 1/2 tablet(2.5mg )         The results of significant diagnostics from this hospitalization (including imaging, microbiology, ancillary and laboratory) are listed below for reference.    Significant Diagnostic Studies: Dg Chest Port 1 View  02/03/2012  *RADIOLOGY REPORT*  Clinical Data: Evaluate the right base infiltrate.  PORTABLE CHEST - 1 VIEW  Comparison: 02/01/2012.  Findings: Right central line has been removed.  Left Mediport catheter remains in place with the tip projecting at the level of the distal left bracheocephalic vein.  No gross pneumothorax.  Persistent consolidation of right mid to lower lung zone may represent an infectious infiltrate in the proper clinical setting. Atelectasis may contribute to this appearance.  Recommend follow-up until clearance.  Appearance unchanged.  Cardiomegaly.  Pulmonary vascular congestion.  Slightly tortuous aorta.  IMPRESSION: Right central line has been removed.  Persistent consolidation right mid to lower lung.  Cardiomegaly and pulmonary vascular congestion.   Original Report Authenticated By: Lacy Duverney, M.D.    Portable Chest Xray In Am  02/01/2012  *RADIOLOGY REPORT*  Clinical Data: Hypoxia and fever.  PORTABLE CHEST - 1 VIEW  Comparison: 01/31/2012  Findings: Right IJ central line tip overlies the level of the superior vena cava.  Left power port tip overlies the superior vena cava.  Heart is enlarged.  There is patchy infiltrate at the right lung base, more confluent and dense since prior studies.  No evidence for pulmonary edema. The patient has had a previous right shoulder arthroplasty.  IMPRESSION: Persistent right lower lobe infiltrate.    Original Report Authenticated By: Norva Pavlov, M.D.    Dg Chest Port 1 View  01/31/2012  *RADIOLOGY REPORT*  Clinical Data: Central line placement.  PORTABLE CHEST - 1 VIEW  Comparison:  Chest radiograph performed 01/30/2012  Findings: The right IJ line is noted ending about the mid to distal SVC.  There is new prominent patchy airspace opacity at the right lung base, compatible with pneumonia.  Mild left basilar opacity may reflect atelectasis or pneumonia.  No definite pleural effusion or pneumothorax is seen.  The cardiomediastinal silhouette is borderline normal in size.  No acute osseous abnormalities are identified.  A right shoulder hemiarthroplasty is noted.  IMPRESSION:  1.  Right IJ line noted ending about the mid to distal SVC. 2.  Significantly worsened patchy airspace opacity at the right lung base, compatible with pneumonia.  Possible additional pneumonia at the left lung base.  These results were called by telephone on 01/31/2012 at 01:33 a.m. to [facility] on [id number], who verbally acknowledged these results.   Original Report Authenticated By: Tonia Ghent, M.D.    Dg Chest Port 1 View  01/30/2012  *RADIOLOGY REPORT*  Clinical Data: Fever, cough.  Bronchitis.  History of asthma. History of lymphoma and melanoma.  Weakness, chills.  PORTABLE CHEST - 1 VIEW  Comparison: 01/12/2012  Findings: The patient has a left-sided power port, tip to the superior vena cava.  Heart size is accentuated by the portable position. There is minimal density in the medial right lung base, raising the question of early infiltrate.  No evidence for edema. The patient has had prior right shoulder arthroplasty.  IMPRESSION: Question of early right lower lobe infiltrate.   Original Report Authenticated By: Norva Pavlov, M.D.      Microbiology: Recent Results (from the past 240 hour(s))  CULTURE, BLOOD (ROUTINE X 2)     Status: Normal   Collection Time   01/30/12  5:27 PM      Component Value Range Status  Comment   Specimen Description BLOOD RIGHT ANTECUBITAL   Final    Special Requests BOTTLES DRAWN AEROBIC AND ANAEROBIC 14CC   Final    Culture  Setup Time 01/31/2012 21:46   Final    Culture     Final    Value: PSEUDOMONAS AERUGINOSA     Note: CRITICAL RESULT CALLED TO, READ BACK BY AND VERIFIED WITH: JAMES DUNLOP 9:35AM 02/01/12 BY RUSCA.   Report Status 02/02/2012 FINAL   Final    Organism ID, Bacteria PSEUDOMONAS AERUGINOSA   Final   CULTURE, BLOOD (ROUTINE X 2)     Status: Normal   Collection Time   01/30/12  6:05 PM      Component Value Range Status Comment   Specimen Description BLOOD LEFT WRIST   Final    Special Requests BOTTLES DRAWN AEROBIC AND ANAEROBIC [facility]   Final    Culture  Setup Time 01/31/2012 21:46   Final    Culture     Final    Value: PSEUDOMONAS AERUGINOSA     Note: SUSCEPTIBILITIES PERFORMED ON PREVIOUS CULTURE WITHIN THE LAST 5 DAYS.     Note: CRITICAL RESULT CALLED TO, READ BACK BY AND VERIFIED WITH: JAMES DUNLOP 02/01/12 @ 9:35AM BY RUSCA.   Report Status 02/02/2012 FINAL   Final   URINE CULTURE     Status: Normal   Collection Time   01/30/12  7:29 PM      Component Value Range Status Comment   Specimen Description URINE, CATHETERIZED   Final    Special Requests NONE   Final    Culture  Setup Time 01/31/2012 21:57   Final    Colony Count NO GROWTH   Final  Culture NO GROWTH   Final    Report Status 02/01/2012 FINAL   Final   MRSA PCR SCREENING     Status: Normal   Collection Time   01/30/12 11:41 PM      Component Value Range Status Comment   MRSA by PCR NEGATIVE  NEGATIVE Final   CULTURE, BLOOD (ROUTINE X 2)     Status: Normal (Preliminary result)   Collection Time   01/31/12  1:00 AM      Component Value Range Status Comment   Specimen Description BLOOD   Final    Special Requests     Final    Value: RIGHT IJ BOTTLES DRAWN AEROBIC AND ANAEROBIC 10CC EACH   Culture  Setup Time 01/31/2012 04:02   Final    Culture     Final    Value:        BLOOD  CULTURE RECEIVED NO GROWTH TO DATE CULTURE WILL BE HELD FOR 5 DAYS BEFORE ISSUING A FINAL NEGATIVE REPORT   Report Status PENDING   Incomplete   CULTURE, BLOOD (ROUTINE X 2)     Status: Normal (Preliminary result)   Collection Time   01/31/12  1:00 AM      Component Value Range Status Comment   Specimen Description BLOOD LEFT ARM   Final    Special Requests BOTTLES DRAWN AEROBIC ONLY 10CC   Final    Culture  Setup Time 01/31/2012 04:02   Final    Culture     Final    Value:        BLOOD CULTURE RECEIVED NO GROWTH TO DATE CULTURE WILL BE HELD FOR 5 DAYS BEFORE ISSUING A FINAL NEGATIVE REPORT   Report Status PENDING   Incomplete   CULTURE, EXPECTORATED SPUTUM-ASSESSMENT     Status: Normal   Collection Time   02/01/12  6:02 AM      Component Value Range Status Comment   Specimen Description SPUTUM   Final    Special Requests NONE   Final    Sputum evaluation     Final    Value: MICROSCOPIC FINDINGS SUGGEST THAT THIS SPECIMEN IS NOT REPRESENTATIVE OF LOWER RESPIRATORY SECRETIONS. PLEASE RECOLLECT.     CALLED TO Marcene Brawn RN AT 1610 02/01/12 MITCHELL,L   Report Status 02/01/2012 FINAL   Final      Labs: Basic Metabolic Panel:  Lab 02/04/12 9604 02/03/12 0540 02/02/12 0500 02/01/12 0500 01/31/12 0100  NA 143 139 137 133* 133*  K 3.2* 3.4* -- -- --  CL 107 106 105 102 101  CO2 27 24 23 21 20   GLUCOSE 116* 113* 105* 139* 127*  BUN 15 19 23  30* 37*  CREATININE 0.75 0.86 0.89 1.02 2.28*  CALCIUM 8.6 8.6 8.8 8.5 8.1*  MG -- -- -- -- 1.7  PHOS -- -- -- -- 3.0   Liver Function Tests:  Lab 02/03/12 0540 01/31/12 0100 01/30/12 1728  AST 25 14 14   ALT 18 9 12   ALKPHOS 80 75 96  BILITOT 0.4 0.6 0.8  PROT 5.7* 5.9* 7.3  ALBUMIN 2.3* 2.7* 3.5    Lab 01/31/12 0100  LIPASE 7*  AMYLASE 31   No results found for this basename: AMMONIA:5 in the last 168 hours CBC:  Lab 02/04/12 0605 02/03/12 0540 02/02/12 0500 02/01/12 0500 01/31/12 0100 01/30/12 1728  WBC 6.7 5.6 3.6* 3.2* 2.4* --   NEUTROABS -- 4.3 -- -- 1.8 1.0*  HGB 9.6* 9.7* 10.3* 9.9* 10.1* --  HCT  27.4* 28.0* 29.3* 29.3* 29.9* --  MCV 82.5 82.6 82.8 84.0 85.7 --  PLT 119* 118* 128* 131* 146* --   Cardiac Enzymes:  Lab 01/30/12 1728  CKTOTAL --  CKMB --  CKMBINDEX --  TROPONINI <0.30   BNP: No components found with this basename: POCBNP:5 CBG:  Lab 02/02/12 2049 01/30/12 2338  GLUCAP 96 136*    Time coordinating discharge:  Greater than 30 minutes  Signed:  Chaney Maclaren, DO [facility] Pager: [phone] 02/04/2012, 12:23 PM

## 2012-02-05 ENCOUNTER — Ambulatory Visit (INDEPENDENT_AMBULATORY_CARE_PROVIDER_SITE_OTHER): Payer: Medicare Other | Admitting: *Deleted

## 2012-02-05 DIAGNOSIS — Z7901 Long term (current) use of anticoagulants: Secondary | ICD-10-CM

## 2012-02-05 DIAGNOSIS — I4891 Unspecified atrial fibrillation: Secondary | ICD-10-CM

## 2012-02-06 LAB — CULTURE, BLOOD (ROUTINE X 2): Culture: NO GROWTH

## 2012-02-09 ENCOUNTER — Ambulatory Visit (INDEPENDENT_AMBULATORY_CARE_PROVIDER_SITE_OTHER): Payer: Medicare Other | Admitting: *Deleted

## 2012-02-09 DIAGNOSIS — I4891 Unspecified atrial fibrillation: Secondary | ICD-10-CM

## 2012-02-09 DIAGNOSIS — Z7901 Long term (current) use of anticoagulants: Secondary | ICD-10-CM

## 2012-02-11 ENCOUNTER — Other Ambulatory Visit (HOSPITAL_COMMUNITY): Payer: Medicare Other

## 2012-02-13 ENCOUNTER — Other Ambulatory Visit (HOSPITAL_COMMUNITY): Payer: Medicare Other

## 2012-02-13 ENCOUNTER — Encounter (HOSPITAL_COMMUNITY): Payer: Self-pay | Admitting: Oncology

## 2012-02-13 ENCOUNTER — Encounter (HOSPITAL_COMMUNITY): Payer: Medicare Other | Attending: Oncology | Admitting: Oncology

## 2012-02-13 VITALS — BP 104/85 | HR 139 | Temp 99.6°F | Resp 22 | Wt 218.0 lb

## 2012-02-13 DIAGNOSIS — D649 Anemia, unspecified: Secondary | ICD-10-CM

## 2012-02-13 DIAGNOSIS — C8290 Follicular lymphoma, unspecified, unspecified site: Secondary | ICD-10-CM

## 2012-02-13 DIAGNOSIS — C8291 Follicular lymphoma, unspecified, lymph nodes of head, face, and neck: Secondary | ICD-10-CM

## 2012-02-13 DIAGNOSIS — R609 Edema, unspecified: Secondary | ICD-10-CM

## 2012-02-13 DIAGNOSIS — C8299 Follicular lymphoma, unspecified, extranodal and solid organ sites: Secondary | ICD-10-CM | POA: Insufficient documentation

## 2012-02-13 LAB — BASIC METABOLIC PANEL
BUN: 14 mg/dL (ref 6–23)
CO2: 26 mEq/L (ref 19–32)
Calcium: 8.1 mg/dL — ABNORMAL LOW (ref 8.4–10.5)
Chloride: 97 mEq/L (ref 96–112)
Creatinine, Ser: 0.95 mg/dL (ref 0.50–1.35)
GFR calc Af Amer: >90 mL/min (ref >90)
GFR calc non Af Amer: 81 mL/min — ABNORMAL LOW (ref >90)
Glucose, Bld: 105 mg/dL — ABNORMAL HIGH (ref 70–99)
Potassium: 3.6 mEq/L (ref 3.5–5.1)
Sodium: 133 mEq/L — ABNORMAL LOW (ref 135–145)

## 2012-02-13 LAB — LACTATE DEHYDROGENASE: LDH: 218 U/L (ref 94–250)

## 2012-02-13 LAB — CBC
HCT: 24.4 % — ABNORMAL LOW (ref 39.0–52.0)
Hemoglobin: 8.4 g/dL — ABNORMAL LOW (ref 13.0–17.0)
MCH: 29.4 pg (ref 26.0–34.0)
MCHC: 34.4 g/dL (ref 30.0–36.0)
MCV: 85.3 fL (ref 78.0–100.0)
Platelets: 192 10*3/uL (ref 150–400)
RBC: 2.86 MIL/uL — ABNORMAL LOW (ref 4.22–5.81)
RDW: 14.8 % (ref 11.5–15.5)
WBC: 1.6 10*3/uL — ABNORMAL LOW (ref 4.0–10.5)

## 2012-02-13 LAB — SEDIMENTATION RATE: Sed Rate: 60 mm/hr — ABNORMAL HIGH (ref 0–16)

## 2012-02-13 MED ORDER — HYDROCODONE-ACETAMINOPHEN 5-325 MG PO TABS: 1.0000 | ORAL_TABLET | Freq: Four times a day (QID) | ORAL | Status: DC | PRN

## 2012-02-13 MED ORDER — POTASSIUM CHLORIDE CRYS ER 20 MEQ PO TBCR: 20.0000 meq | EXTENDED_RELEASE_TABLET | Freq: Every day | ORAL | Status: DC

## 2012-02-13 NOTE — Patient Instructions (Addendum)
[facility]  Discharge Instructions  RECOMMENDATIONS MADE BY THE CONSULTANT AND ANY TEST RESULTS WILL BE SENT TO YOUR REFERRING DOCTOR.   EXAM FINDINGS BY MD TODAY AND SIGNS AND SYMPTOMS TO REPORT TO CLINIC OR PRIMARY MD: Exam and discussion by PA.  We will stop the Rituxan.   MEDICATIONS PRESCRIBED:  You can restart your Lasix at 1/2 tablet daily.  Potassium was e-scribed and you need to take 1 daily.   Hydrocodone-acetaminophen 5-325 mg take 1 - 2 tablets every 6 hours as needed for pain.  Follow label directions  INSTRUCTIONS GIVEN AND DISCUSSED: Other :  Follow-up with Dr. [name] after Thanksgiving about your blood pressure medications  SPECIAL INSTRUCTIONS/FOLLOW-UP: Return to Clinic for port flushes every 6 weeks and to see Dr. Mariel Sleet in 2 months.   I acknowledge that I have been informed and understand all the instructions given to me and received a copy. I do not have any more questions at this time, but understand that I may call the Specialty Clinic at [facility] at [phone] during business hours should I have any further questions or need assistance in obtaining follow-up care.    __________________________________________  _____________  __________ Signature of Patient or Authorized Representative            Date                   Time    __________________________________________ Nurse's Signature

## 2012-02-13 NOTE — Progress Notes (Signed)
[name], MD [address] [place] [address]  1. Follicular lymphoma     CURRENT THERAPY: Observation  INTERVAL HISTORY: SCHUYLER MCLAWHORN 71 y.o. 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 He is without B. symptomatology. Presently he feels very well. He was treated with 6 cycles of bendamustine and Rituxan with a CR on PET scan after 3 cycles.  Unfortunately Mr. Scipio was admitted at [facility] for Septic Shock/Pseudomonas pneumonia and bacteremia requiring pressor support for 48 hours.  He was admitted to ICU. He was noted to be in atrial fibrillation and was started on Coumadin.  He was also noted to have acute renal failure which improved and resolved over his hospital stay.  He was admitted from 01/30/2012 through 02/04/2012.  He is presently finishing up his antibiotic regimen.  Mr. Dawdy had quite the scare mentioned above.  He has not yet seen his PCP since discharge from the hospital as recommended at time of discharge.  I recommended he follow-up with his PCP after Thanksgiving.   The patient has significant 3+ edema of his LE.  His skin is beginning to crack and they are tender.  His Lasix has been on hold since his hospitalization discharge.  He has 40 mg tablets at home.  I have asked him to take 1/2 tablet (20 mg) daily.  Since he will be on Lasix, I will also start him on K-dur 20 mEq daily.    His wife would like to transition his pain medication from oxycodone to hydrocodone which is reasonable at this time.  I will provide him an Rx for Hydrocodone 5/325 mg take 1-2 tablets every 6 hours for pain.    I have requested the patient get subsequent Rx refills for the aforementioned medications from his PCP.  I have encouraged a moisturizing lotion to his LE and particularly to his ankles and  feet where he has significantly dry skin.   Homero Fellers is not 100% back to his baseline.  He is accompanied by his wife.  He is seen in a wheelchair.  He is also not 100% clear minded and says incorrect comments with regards to his hospitalization which his wife corrects.   We had a discussion today with regards to his Rituxan chemotherapy maintenance program.  We will discontinue this plan since he had a life-threatening illness since Rituxan can decrease blood counts.  The patient's antibody plan in [facility] has therefore been discontinued.   Past Medical History  Diagnosis Date  . 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 Hypertension   . Atrial fibrillation 10/2008  . Nonischemic cardiomyopathy 02/2009    Presented with CHF; EF of 25-30%; Nonobstructive ASCVD-worst lesion 40% on coronary angiography  . Peptic ulcer disease   . Basal cell carcinoma   . Degenerative joint disease     Knees and shoulders  . Pneumonia 2010  . Low TSH level     Borderline  . Allergic rhinitis   . Anxiety   . Drug abuse   . Tobacco abuse     50 pack years    has THYROID STIMULATING HORMONE, ABNORMAL; TOBACCO ABUSE; INSOMNIA, CHRONIC; HYPERTENSION; GASTRIC ULCER, HX OF; Atrial fibrillation; Chronic anticoagulation; Follicular lymphoma; Mixed hyperlipidemia; Septic shock(785.52); Pneumonia; and Bacteremia due to Pseudomonas on his problem list.  is allergic to statins.  Mr. Bannan had no medications administered during this visit.  Past Surgical History  Procedure Date  . Neck lesion biopsy June 24, 2010    Follicular lymphoma  . Skin cancer excision     under left breast; variously described as melanoma and basal cell  . Rotator cuff repair     right  . Patella fracture surgery 1975  . Hematoma evacuation     after melanoma removal  . Portacath placement 07/12/2010  . Colonoscopy 2009    Also underwent an EGD; patient reports no significant findings    Denies any headaches, dizziness, double vision,  fevers, chills, night sweats, nausea, vomiting, diarrhea, constipation, chest pain, heart palpitations, shortness of breath, blood in stool, black tarry stool, urinary pain, urinary burning, urinary frequency, hematuria.   PHYSICAL EXAMINATION  ECOG PERFORMANCE STATUS: 3 - Symptomatic, >50% confined to bed  Filed Vitals:   02/13/12 1034  BP:   Pulse: 139  Temp:   Resp:     GENERAL:alert, no distress, well nourished, well developed, comfortable, cooperative and smiling, sallow SKIN: skin color, texture, turgor are normal, no rashes or significant lesions HEAD: Normocephalic, No masses, lesions, tenderness or abnormalities EYES: normal, Conjunctiva are pink and non-injected EARS: External ears normal OROPHARYNX:mucous membranes are moist  NECK: supple, no adenopathy, thyroid normal size, non-tender, without nodularity, no stridor, non-tender, trachea midline LYMPH:  no palpable lymphadenopathy BREAST:not examined LUNGS: crackles in the right middle lobe, otherwise clear to auscultation. HEART: no murmurs, no gallops, irregularly irregular, S1 normal and S2 normal ABDOMEN:abdomen soft, non-tender and normal bowel sounds BACK: Back symmetric, no curvature. EXTREMITIES:less then 2 second capillary refill, no joint deformities, effusion, or inflammation, no skin discoloration, no clubbing, no cyanosis, positive findings:  edema 3+ B/L LE edema  NEURO: alert & oriented x 3 with fluent speech, no focal motor/sensory deficits, in a wheelchair    LABORATORY DATA: CBC    Component Value Date/Time   WBC 6.7 02/04/2012 0605   RBC 3.32* 02/04/2012 0605   HGB 9.6* 02/04/2012 0605   HCT 27.4* 02/04/2012 0605   PLT 119* 02/04/2012 0605   MCV 82.5 02/04/2012 0605   MCH 28.9 02/04/2012 0605   MCHC 35.0 02/04/2012 0605   RDW 14.8 02/04/2012 0605   LYMPHSABS 0.3* 02/03/2012 0540   MONOABS 1.0 02/03/2012 0540   EOSABS 0.0 02/03/2012 0540   BASOSABS 0.0 02/03/2012 0540      Chemistry       Component Value Date/Time   NA 143 02/04/2012 0605   K 3.2* 02/04/2012 0605   CL 107 02/04/2012 0605   CO2 27 02/04/2012 0605   BUN 15 02/04/2012 0605   CREATININE 0.75 02/04/2012 0605   CREATININE 0.95 02/27/2011 0850      Component Value Date/Time   CALCIUM 8.6 02/04/2012 0605   ALKPHOS 80 02/03/2012 0540   AST 25 02/03/2012 0540   ALT 18 02/03/2012 0540   BILITOT 0.4 02/03/2012 0540        ASSESSMENT:  1. 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 He is without B. symptomatology. Presently he feels very well. He was treated with 6 cycles of bendamustine and Rituxan with a CR on PET scan after 3 cycles. Unfortunately he still smokes 3-4 cigarettes a day due to his high anxiety level. He still uses the Xanax 4 times a day for this.  He is on maintenance Rituxan with no complications of infections or need for antibiotics.  He is not on acyclovir or Bactrim. 2. COPD still smoking  3. CHF in December 2010 presented with atrial fibrillation at that time  4. Chronic anxiety on Xanax 4 times a day 5. Septic Shock/Pseudomonas pneumonia and bacteremia requiring ICU admission at [facility]   PLAN:  1. I personally reviewed and went over laboratory results with the patient. 2. I personally reviewed and went over radiographic studies with the patient. 3. Discussion regarding Rituxan maintenance.  We will discontinue 4. Antibody plan discontinued 5. Patient encouraged to take 20 mg Lasix daily for LE edema.  Subsequent refills should be from PCP 6. 20 mEq Kdur escribed to pharmacy 7. D/C Oxycodone 8. Rx for Hydrocodone 5/325 mg #60.  Take 1-2 tablets every 6 hours PRN pain.  Subsequent refills should be from PCP. 9. Labs today: CBC diff, BMET, LDH, ESR 10. Recommend following up with PCP after Thanksgiving for follow-up after discharge from hospital.  11. After reviewing lab work  from today, will transfuse 2 units irradiated PRBC for Hgb of 8.4 g/dL. 12. Return for follow-up in mid-January.    All questions were answered. The patient knows to call the clinic with any problems, questions or concerns. We can certainly see the patient much sooner if necessary.  The patient and plan discussed with Glenford Peers, MD and he is in agreement with the aforementioned.  KEFALAS,THOMAS

## 2012-02-16 ENCOUNTER — Encounter (HOSPITAL_BASED_OUTPATIENT_CLINIC_OR_DEPARTMENT_OTHER): Payer: Medicare Other

## 2012-02-16 ENCOUNTER — Ambulatory Visit (INDEPENDENT_AMBULATORY_CARE_PROVIDER_SITE_OTHER): Payer: Medicare Other | Admitting: *Deleted

## 2012-02-16 DIAGNOSIS — D649 Anemia, unspecified: Secondary | ICD-10-CM

## 2012-02-16 DIAGNOSIS — I4891 Unspecified atrial fibrillation: Secondary | ICD-10-CM

## 2012-02-16 DIAGNOSIS — Z7901 Long term (current) use of anticoagulants: Secondary | ICD-10-CM

## 2012-02-16 DIAGNOSIS — C8291 Follicular lymphoma, unspecified, lymph nodes of head, face, and neck: Secondary | ICD-10-CM

## 2012-02-16 LAB — PREPARE RBC (CROSSMATCH)

## 2012-02-16 NOTE — Progress Notes (Signed)
Labs drawn today for type and screen 

## 2012-02-17 ENCOUNTER — Encounter (HOSPITAL_BASED_OUTPATIENT_CLINIC_OR_DEPARTMENT_OTHER): Payer: Medicare Other

## 2012-02-17 VITALS — BP 102/59 | HR 70 | Temp 97.8°F | Resp 16

## 2012-02-17 DIAGNOSIS — D649 Anemia, unspecified: Secondary | ICD-10-CM

## 2012-02-17 MED ORDER — FUROSEMIDE 10 MG/ML IJ SOLN
20.0000 mg | Freq: Once | INTRAMUSCULAR | Status: AC
  Administered 2012-02-17: 20 mg via INTRAVENOUS

## 2012-02-17 MED ORDER — ACETAMINOPHEN 325 MG PO TABS
ORAL_TABLET | ORAL | Status: AC
Start: 2012-02-17 — End: 2012-02-17
  Filled 2012-02-17: qty 2

## 2012-02-17 MED ORDER — FUROSEMIDE 10 MG/ML IJ SOLN
INTRAMUSCULAR | Status: AC
  Filled 2012-02-17: qty 2

## 2012-02-17 MED ORDER — HEPARIN SOD (PORK) LOCK FLUSH 100 UNIT/ML IV SOLN
INTRAVENOUS | Status: AC
  Filled 2012-02-17: qty 5

## 2012-02-17 MED ORDER — DIPHENHYDRAMINE HCL 25 MG PO CAPS
ORAL_CAPSULE | ORAL | Status: AC
  Filled 2012-02-17: qty 1

## 2012-02-17 MED ORDER — HEPARIN SOD (PORK) LOCK FLUSH 100 UNIT/ML IV SOLN
500.0000 [IU] | Freq: Every day | INTRAVENOUS | Status: AC | PRN
  Administered 2012-02-17: 500 [IU]
  Filled 2012-02-17: qty 5

## 2012-02-17 MED ORDER — ACETAMINOPHEN 325 MG PO TABS
650.0000 mg | ORAL_TABLET | Freq: Once | ORAL | Status: AC
  Administered 2012-02-17: 650 mg via ORAL

## 2012-02-17 MED ORDER — DIPHENHYDRAMINE HCL 25 MG PO CAPS
25.0000 mg | ORAL_CAPSULE | Freq: Once | ORAL | Status: AC
  Administered 2012-02-17: 25 mg via ORAL

## 2012-02-17 MED ORDER — SODIUM CHLORIDE 0.9 % IV SOLN
250.0000 mL | Freq: Once | INTRAVENOUS | Status: AC
  Administered 2012-02-17: 250 mL via INTRAVENOUS

## 2012-02-18 ENCOUNTER — Ambulatory Visit (INDEPENDENT_AMBULATORY_CARE_PROVIDER_SITE_OTHER): Payer: Medicare Other | Admitting: *Deleted

## 2012-02-18 DIAGNOSIS — Z7901 Long term (current) use of anticoagulants: Secondary | ICD-10-CM

## 2012-02-18 DIAGNOSIS — I4891 Unspecified atrial fibrillation: Secondary | ICD-10-CM

## 2012-02-18 LAB — TYPE AND SCREEN
ABO/RH(D): O POS
Antibody Screen: NEGATIVE
Unit division: 0

## 2012-02-18 LAB — POCT INR: INR: 3.4

## 2012-02-23 ENCOUNTER — Ambulatory Visit (INDEPENDENT_AMBULATORY_CARE_PROVIDER_SITE_OTHER): Payer: Medicare Other | Admitting: *Deleted

## 2012-02-23 DIAGNOSIS — I4891 Unspecified atrial fibrillation: Secondary | ICD-10-CM

## 2012-02-23 DIAGNOSIS — Z7901 Long term (current) use of anticoagulants: Secondary | ICD-10-CM

## 2012-02-23 LAB — POCT INR: INR: 4.5

## 2012-02-26 ENCOUNTER — Ambulatory Visit (INDEPENDENT_AMBULATORY_CARE_PROVIDER_SITE_OTHER): Payer: Medicare Other | Admitting: *Deleted

## 2012-02-26 DIAGNOSIS — I4891 Unspecified atrial fibrillation: Secondary | ICD-10-CM

## 2012-02-26 DIAGNOSIS — Z7901 Long term (current) use of anticoagulants: Secondary | ICD-10-CM

## 2012-03-01 ENCOUNTER — Ambulatory Visit (INDEPENDENT_AMBULATORY_CARE_PROVIDER_SITE_OTHER): Payer: Medicare Other | Admitting: *Deleted

## 2012-03-01 DIAGNOSIS — I4891 Unspecified atrial fibrillation: Secondary | ICD-10-CM

## 2012-03-01 DIAGNOSIS — Z7901 Long term (current) use of anticoagulants: Secondary | ICD-10-CM

## 2012-03-01 LAB — POCT INR: INR: 2.1

## 2012-03-02 ENCOUNTER — Inpatient Hospital Stay (HOSPITAL_COMMUNITY): Payer: Medicare Other

## 2012-03-03 ENCOUNTER — Encounter (HOSPITAL_COMMUNITY): Payer: Medicare Other

## 2012-03-08 ENCOUNTER — Ambulatory Visit (INDEPENDENT_AMBULATORY_CARE_PROVIDER_SITE_OTHER): Payer: Medicare Other | Admitting: *Deleted

## 2012-03-08 DIAGNOSIS — Z7901 Long term (current) use of anticoagulants: Secondary | ICD-10-CM

## 2012-03-08 DIAGNOSIS — I4891 Unspecified atrial fibrillation: Secondary | ICD-10-CM

## 2012-03-08 LAB — POCT INR: INR: 2.5

## 2012-03-12 ENCOUNTER — Other Ambulatory Visit: Payer: Self-pay | Admitting: *Deleted

## 2012-03-12 MED ORDER — CARVEDILOL 25 MG PO TABS: 25.0000 mg | ORAL_TABLET | Freq: Two times a day (BID) | ORAL | Status: DC

## 2012-03-15 ENCOUNTER — Ambulatory Visit (INDEPENDENT_AMBULATORY_CARE_PROVIDER_SITE_OTHER): Payer: Medicare Other | Admitting: *Deleted

## 2012-03-15 ENCOUNTER — Encounter (HOSPITAL_COMMUNITY): Payer: Medicare Other | Attending: Oncology

## 2012-03-15 DIAGNOSIS — C8299 Follicular lymphoma, unspecified, extranodal and solid organ sites: Secondary | ICD-10-CM

## 2012-03-15 DIAGNOSIS — Z7901 Long term (current) use of anticoagulants: Secondary | ICD-10-CM

## 2012-03-15 DIAGNOSIS — D649 Anemia, unspecified: Secondary | ICD-10-CM | POA: Insufficient documentation

## 2012-03-15 DIAGNOSIS — Z452 Encounter for adjustment and management of vascular access device: Secondary | ICD-10-CM

## 2012-03-15 DIAGNOSIS — I4891 Unspecified atrial fibrillation: Secondary | ICD-10-CM

## 2012-03-15 MED ORDER — HEPARIN SOD (PORK) LOCK FLUSH 100 UNIT/ML IV SOLN
500.0000 [IU] | Freq: Once | INTRAVENOUS | Status: AC
  Administered 2012-03-15: 500 [IU] via INTRAVENOUS
  Filled 2012-03-15: qty 5

## 2012-03-15 MED ORDER — HEPARIN SOD (PORK) LOCK FLUSH 100 UNIT/ML IV SOLN
INTRAVENOUS | Status: AC
  Filled 2012-03-15: qty 5

## 2012-03-15 MED ORDER — SODIUM CHLORIDE 0.9 % IJ SOLN
10.0000 mL | INTRAMUSCULAR | Status: DC | PRN
  Administered 2012-03-15: 10 mL via INTRAVENOUS
  Filled 2012-03-15: qty 10

## 2012-03-15 NOTE — Progress Notes (Signed)
Charles [name] presented for Portacath access and flush.  Proper placement of portacath confirmed by CXR.  Portacath located left chest wall accessed with  H 20 needle.  No blood return and Portacath flushed with 10ml NS and 500U/43ml Heparin and needle removed intact.  Procedure without incident.  Patient tolerated procedure well.

## 2012-03-19 ENCOUNTER — Encounter (HOSPITAL_COMMUNITY): Payer: Medicare Other

## 2012-03-29 ENCOUNTER — Ambulatory Visit (INDEPENDENT_AMBULATORY_CARE_PROVIDER_SITE_OTHER): Payer: Medicare Other | Admitting: *Deleted

## 2012-03-29 DIAGNOSIS — Z7901 Long term (current) use of anticoagulants: Secondary | ICD-10-CM

## 2012-03-29 DIAGNOSIS — I4891 Unspecified atrial fibrillation: Secondary | ICD-10-CM

## 2012-04-05 ENCOUNTER — Ambulatory Visit (INDEPENDENT_AMBULATORY_CARE_PROVIDER_SITE_OTHER): Payer: Medicare Other | Admitting: *Deleted

## 2012-04-05 DIAGNOSIS — Z7901 Long term (current) use of anticoagulants: Secondary | ICD-10-CM

## 2012-04-05 DIAGNOSIS — I4891 Unspecified atrial fibrillation: Secondary | ICD-10-CM

## 2012-04-12 ENCOUNTER — Encounter (HOSPITAL_COMMUNITY): Payer: Medicare Other | Attending: Oncology | Admitting: Oncology

## 2012-04-12 VITALS — BP 84/49 | HR 54 | Temp 97.8°F | Resp 18 | Wt 194.8 lb

## 2012-04-12 DIAGNOSIS — C8290 Follicular lymphoma, unspecified, unspecified site: Secondary | ICD-10-CM

## 2012-04-12 DIAGNOSIS — D649 Anemia, unspecified: Secondary | ICD-10-CM | POA: Insufficient documentation

## 2012-04-12 DIAGNOSIS — C8299 Follicular lymphoma, unspecified, extranodal and solid organ sites: Secondary | ICD-10-CM

## 2012-04-12 DIAGNOSIS — J189 Pneumonia, unspecified organism: Secondary | ICD-10-CM | POA: Insufficient documentation

## 2012-04-12 LAB — CBC WITH DIFFERENTIAL/PLATELET
Eosinophils Relative: 18 % — ABNORMAL HIGH (ref 0–5)
HCT: 30.4 % — ABNORMAL LOW (ref 39.0–52.0)
Hemoglobin: 9.8 g/dL — ABNORMAL LOW (ref 13.0–17.0)
Lymphocytes Relative: 29 % (ref 12–46)
Lymphs Abs: 0.7 10*3/uL (ref 0.7–4.0)
MCH: 27.5 pg (ref 26.0–34.0)
MCV: 85.4 fL (ref 78.0–100.0)
Monocytes Absolute: 0.7 10*3/uL (ref 0.1–1.0)
Monocytes Relative: 32 % — ABNORMAL HIGH (ref 3–12)
RBC: 3.56 MIL/uL — ABNORMAL LOW (ref 4.22–5.81)
WBC: 2.2 10*3/uL — ABNORMAL LOW (ref 4.0–10.5)

## 2012-04-12 LAB — COMPREHENSIVE METABOLIC PANEL
AST: 11 U/L (ref 0–37)
CO2: 27 mEq/L (ref 19–32)
Calcium: 9.5 mg/dL (ref 8.4–10.5)
Creatinine, Ser: 1.02 mg/dL (ref 0.50–1.35)
GFR calc Af Amer: 83 mL/min — ABNORMAL LOW (ref >90)
GFR calc non Af Amer: 71 mL/min — ABNORMAL LOW (ref >90)
Glucose, Bld: 105 mg/dL — ABNORMAL HIGH (ref 70–99)

## 2012-04-12 NOTE — Progress Notes (Signed)
Brave O Weatherford presented for labwork. Labs per MD order drawn via Peripheral Line 23 gauge needle inserted in right AC  Good blood return present. Procedure without incident.  Needle removed intact. Patient tolerated procedure well.   

## 2012-04-12 NOTE — Progress Notes (Signed)
Problem number 1 stage IV a, grade 3, CD20 positive follicular lymphoma presenting with asymptomatic large right neck mass and PET scan showing multiple bones involved. Presently he has no B. symptoms. He was treated with 6 cycles of bendamustine and Rituxan with a CR by PET scan criteria after 3 cycles. Problem #2 COPD Problem #3 pneumonia November 2013 [facility]. He has been on 2 antibiotics since then presently he is on doxycycline. Problem #4 degenerative joint disease of the shoulders and knees Problem #5 nonischemic cardiomyopathy with a reduced ejection fraction Problem #6 peptic ulcer disease Problem #7 atrial fibrillation He is here today with his wife. He tells me that he has had coughing some shortness of breath, lots of postnasal drip with coughing episodes at night. Coughing up discolored phlegm and is now on a new antibiotic namely doxycycline.  Sides the pain in both shoulders which is quite severe he states he has pain in both knees which have crepitations that are significant on both sides. His left kneecap Is gone on the left side. His rotator cuff was damaged severely on the right in the past. He appears in no acute distress but he does look chronically ill. Vital signs show no fever but his blood pressures only 84/49. Pulses run around 60. He has an irregularly irregular rhythm. Heart is not reveal an S3 gallop. There is a questionable grade 1/6 systolic ejection murmur. Lungs show rales half way up the chest on the left and just at the base of the right knee do not clear with coughing. Abdomen is soft and nontender without hepatosplenomegaly but I can't feel his liver at 1-2 cm below the costal margin on deep inspiration. I cannot feel a spleen. He has no lymphadenopathy in any location. I would like to do a CAT scan to make sure he does not have some obstructive of adenopathy there. I think he needs to absent quit smoking as 4-6 cigarettes per day. He needs to  continue his antibiotic. I need to make sure his immunoglobulins are not low after his diagnosis of non-Hodgkin's lymphoma. Tentatively we will see him back in 3 months

## 2012-04-12 NOTE — Patient Instructions (Addendum)
[facility] Discharge Instructions  RECOMMENDATIONS MADE BY THE CONSULTANT AND ANY TEST RESULTS WILL BE SENT TO YOUR REFERRING PHYSICIAN.  EXAM FINDINGS BY THE PHYSICIAN TODAY AND SIGNS OR SYMPTOMS TO REPORT TO CLINIC OR PRIMARY PHYSICIAN: exam and discussion by MD.  Need to do CT of your chest and check some blood work.    MEDICATIONS PRESCRIBED:  Take a whole pill for pain  INSTRUCTIONS GIVEN AND DISCUSSED: Report uncontrolled pain, fevers  SPECIAL INSTRUCTIONS/FOLLOW-UP: Follow-up in 3 months.  Thank you for choosing [facility] to provide your oncology and hematology care.  To afford each patient quality time with our providers, please arrive at least 15 minutes before your scheduled appointment time.  With your help, our goal is to use those 15 minutes to complete the necessary work-up to ensure our physicians have the information they need to help with your evaluation and healthcare recommendations.    Effective January 1st, 2014, we ask that you re-schedule your appointment with our physicians should you arrive 10 or more minutes late for your appointment.  We strive to give you quality time with our providers, and arriving late affects you and other patients whose appointments are after yours.    Again, thank you for choosing [facility]  Our hope is that these requests will decrease the amount of time that you wait before being seen by our physicians.       _____________________________________________________________  Should you have questions after your visit to [facility], please contact our office at [phone] between the hours of 8:30 a.m. and 5:00 p.m.  Voicemails left after 4:30 p.m. will not be returned until the following business day.  For prescription refill requests, have your pharmacy contact our office with your prescription refill request.

## 2012-04-13 ENCOUNTER — Encounter (HOSPITAL_COMMUNITY): Payer: Self-pay

## 2012-04-13 ENCOUNTER — Telehealth (HOSPITAL_COMMUNITY): Payer: Self-pay

## 2012-04-13 ENCOUNTER — Ambulatory Visit (HOSPITAL_COMMUNITY)
Admission: RE | Admit: 2012-04-13 | Discharge: 2012-04-13 | Disposition: A | Discharge status: Home / Self Care | Payer: Medicare Other | Source: Ambulatory Visit | Attending: Oncology | Admitting: Oncology

## 2012-04-13 ENCOUNTER — Other Ambulatory Visit (HOSPITAL_COMMUNITY): Payer: Self-pay | Admitting: Oncology

## 2012-04-13 DIAGNOSIS — Z87898 Personal history of other specified conditions: Secondary | ICD-10-CM | POA: Insufficient documentation

## 2012-04-13 DIAGNOSIS — C8290 Follicular lymphoma, unspecified, unspecified site: Secondary | ICD-10-CM

## 2012-04-13 DIAGNOSIS — R918 Other nonspecific abnormal finding of lung field: Secondary | ICD-10-CM | POA: Insufficient documentation

## 2012-04-13 LAB — IGG, IGA, IGM
IgA: 344 mg/dL (ref 68–379)
IgG (Immunoglobin G), Serum: 1060 mg/dL (ref 650–1600)

## 2012-04-13 MED ORDER — IOHEXOL 300 MG/ML  SOLN
80.0000 mL | Freq: Once | INTRAMUSCULAR | Status: AC | PRN
  Administered 2012-04-13: 80 mL via INTRAVENOUS

## 2012-04-13 NOTE — Telephone Encounter (Signed)
Patient notified per request of Dr. Mariel Sleet that he has pneumonia and wants him to get Neupogen daily for 7 days and to return to see Dr. Mariel Sleet in 2 weeks. Verbalized understanding of instructions.

## 2012-04-14 ENCOUNTER — Encounter (HOSPITAL_BASED_OUTPATIENT_CLINIC_OR_DEPARTMENT_OTHER): Payer: Medicare Other

## 2012-04-14 VITALS — BP 80/53 | HR 66

## 2012-04-14 DIAGNOSIS — C8299 Follicular lymphoma, unspecified, extranodal and solid organ sites: Secondary | ICD-10-CM

## 2012-04-14 DIAGNOSIS — J189 Pneumonia, unspecified organism: Secondary | ICD-10-CM

## 2012-04-14 DIAGNOSIS — D702 Other drug-induced agranulocytosis: Secondary | ICD-10-CM

## 2012-04-14 MED ORDER — FILGRASTIM 480 MCG/1.6ML IJ SOLN
480.0000 ug | Freq: Once | INTRAMUSCULAR | Status: AC
  Administered 2012-04-14: 480 ug via SUBCUTANEOUS
  Filled 2012-04-14: qty 1.6

## 2012-04-14 MED ORDER — FILGRASTIM 300 MCG/0.5ML IJ SOLN: 480.0000 ug | Freq: Once | INTRAMUSCULAR | Status: DC

## 2012-04-14 NOTE — Progress Notes (Signed)
Neupogen 480 mcg given sub-q to lower right abd.  Tolerated well.

## 2012-04-15 ENCOUNTER — Other Ambulatory Visit: Payer: Self-pay | Admitting: *Deleted

## 2012-04-15 ENCOUNTER — Encounter (HOSPITAL_BASED_OUTPATIENT_CLINIC_OR_DEPARTMENT_OTHER): Payer: Medicare Other

## 2012-04-15 ENCOUNTER — Other Ambulatory Visit: Payer: Self-pay | Admitting: Adult Health

## 2012-04-15 VITALS — BP 95/58 | HR 43 | Temp 98.3°F | Resp 20

## 2012-04-15 DIAGNOSIS — D702 Other drug-induced agranulocytosis: Secondary | ICD-10-CM

## 2012-04-15 DIAGNOSIS — J189 Pneumonia, unspecified organism: Secondary | ICD-10-CM

## 2012-04-15 DIAGNOSIS — C8299 Follicular lymphoma, unspecified, extranodal and solid organ sites: Secondary | ICD-10-CM

## 2012-04-15 MED ORDER — FILGRASTIM 300 MCG/0.5ML IJ SOLN
480.0000 ug | Freq: Once | INTRAMUSCULAR | Status: DC
  Filled 2012-04-15: qty 0.8

## 2012-04-15 MED ORDER — FILGRASTIM 480 MCG/1.6ML IJ SOLN
480.0000 ug | Freq: Once | INTRAMUSCULAR | Status: AC
  Administered 2012-04-15: 480 ug via SUBCUTANEOUS

## 2012-04-15 MED ORDER — FILGRASTIM 480 MCG/1.6ML IJ SOLN
480.0000 ug | Freq: Once | INTRAMUSCULAR | Status: DC
  Filled 2012-04-15 (×8): qty 1.6

## 2012-04-15 NOTE — Progress Notes (Signed)
Charles [name] presents today for injection per MD orders. Neupogen 480 mcg administered SQ in left Abdomen. Administration without incident. Patient tolerated well.

## 2012-04-16 ENCOUNTER — Encounter (HOSPITAL_BASED_OUTPATIENT_CLINIC_OR_DEPARTMENT_OTHER): Payer: Medicare Other

## 2012-04-16 VITALS — BP 114/75 | HR 78 | Temp 97.5°F | Resp 22

## 2012-04-16 DIAGNOSIS — C8299 Follicular lymphoma, unspecified, extranodal and solid organ sites: Secondary | ICD-10-CM

## 2012-04-16 DIAGNOSIS — J189 Pneumonia, unspecified organism: Secondary | ICD-10-CM

## 2012-04-16 DIAGNOSIS — D702 Other drug-induced agranulocytosis: Secondary | ICD-10-CM

## 2012-04-16 MED ORDER — FILGRASTIM 480 MCG/1.6ML IJ SOLN
480.0000 ug | Freq: Once | INTRAMUSCULAR | Status: AC
  Administered 2012-04-16: 480 ug via SUBCUTANEOUS
  Filled 2012-04-16: qty 1.6

## 2012-04-16 NOTE — Progress Notes (Signed)
Charles [name] presents today for injection per MD orders. Neupogen 480 mcg administered SQ in left Abdomen. Administration without incident. Patient tolerated well.  V.O. Tussionex 120 cc (no refills)  / 5ml q 12 hrs called to [facility] per Dr.Neijstrom.

## 2012-04-17 ENCOUNTER — Encounter (HOSPITAL_BASED_OUTPATIENT_CLINIC_OR_DEPARTMENT_OTHER): Payer: Medicare Other

## 2012-04-17 DIAGNOSIS — C8299 Follicular lymphoma, unspecified, extranodal and solid organ sites: Secondary | ICD-10-CM

## 2012-04-17 DIAGNOSIS — D702 Other drug-induced agranulocytosis: Secondary | ICD-10-CM

## 2012-04-17 MED ORDER — FILGRASTIM 480 MCG/1.6ML IJ SOLN
480.0000 ug | Freq: Once | INTRAMUSCULAR | Status: AC
  Administered 2012-04-17: 480 ug via SUBCUTANEOUS
  Filled 2012-04-17: qty 1.6

## 2012-04-17 NOTE — Progress Notes (Signed)
Charles [name] presents today for injection per MD orders. Neupogen 480 mcg administered SQ in left Abdomen. Administration without incident. Patient tolerated well.  

## 2012-04-18 ENCOUNTER — Encounter (HOSPITAL_BASED_OUTPATIENT_CLINIC_OR_DEPARTMENT_OTHER): Payer: Medicare Other

## 2012-04-18 DIAGNOSIS — C8299 Follicular lymphoma, unspecified, extranodal and solid organ sites: Secondary | ICD-10-CM

## 2012-04-18 DIAGNOSIS — D702 Other drug-induced agranulocytosis: Secondary | ICD-10-CM

## 2012-04-18 MED ORDER — FILGRASTIM 480 MCG/1.6ML IJ SOLN
480.0000 ug | Freq: Once | INTRAMUSCULAR | Status: AC
  Administered 2012-04-18: 480 ug via SUBCUTANEOUS
  Filled 2012-04-18: qty 1.6

## 2012-04-18 NOTE — Progress Notes (Signed)
Charles [name] presents today for injection per MD orders. Neupogen 480 mcg administered SQ in right Abdomen. Administration without incident. Patient tolerated well.

## 2012-04-19 ENCOUNTER — Ambulatory Visit (INDEPENDENT_AMBULATORY_CARE_PROVIDER_SITE_OTHER): Payer: Medicare Other | Admitting: Cardiology

## 2012-04-19 ENCOUNTER — Ambulatory Visit (INDEPENDENT_AMBULATORY_CARE_PROVIDER_SITE_OTHER): Payer: Medicare Other | Admitting: *Deleted

## 2012-04-19 ENCOUNTER — Encounter: Payer: Self-pay | Admitting: Cardiology

## 2012-04-19 ENCOUNTER — Encounter (HOSPITAL_BASED_OUTPATIENT_CLINIC_OR_DEPARTMENT_OTHER): Payer: Medicare Other

## 2012-04-19 VITALS — BP 95/60 | HR 85 | Ht 67.0 in | Wt 194.0 lb

## 2012-04-19 VITALS — BP 95/57 | HR 94 | Temp 97.5°F | Resp 20

## 2012-04-19 DIAGNOSIS — C8290 Follicular lymphoma, unspecified, unspecified site: Secondary | ICD-10-CM

## 2012-04-19 DIAGNOSIS — J189 Pneumonia, unspecified organism: Secondary | ICD-10-CM

## 2012-04-19 DIAGNOSIS — D649 Anemia, unspecified: Secondary | ICD-10-CM | POA: Insufficient documentation

## 2012-04-19 DIAGNOSIS — G4700 Insomnia, unspecified: Secondary | ICD-10-CM

## 2012-04-19 DIAGNOSIS — Z8711 Personal history of peptic ulcer disease: Secondary | ICD-10-CM

## 2012-04-19 DIAGNOSIS — I4891 Unspecified atrial fibrillation: Secondary | ICD-10-CM

## 2012-04-19 DIAGNOSIS — Z7901 Long term (current) use of anticoagulants: Secondary | ICD-10-CM

## 2012-04-19 DIAGNOSIS — E785 Hyperlipidemia, unspecified: Secondary | ICD-10-CM

## 2012-04-19 DIAGNOSIS — I10 Essential (primary) hypertension: Secondary | ICD-10-CM

## 2012-04-19 DIAGNOSIS — D702 Other drug-induced agranulocytosis: Secondary | ICD-10-CM

## 2012-04-19 DIAGNOSIS — C8589 Other specified types of non-Hodgkin lymphoma, extranodal and solid organ sites: Secondary | ICD-10-CM

## 2012-04-19 DIAGNOSIS — E079 Disorder of thyroid, unspecified: Secondary | ICD-10-CM

## 2012-04-19 DIAGNOSIS — F17200 Nicotine dependence, unspecified, uncomplicated: Secondary | ICD-10-CM

## 2012-04-19 DIAGNOSIS — C8299 Follicular lymphoma, unspecified, extranodal and solid organ sites: Secondary | ICD-10-CM

## 2012-04-19 MED ORDER — FILGRASTIM 480 MCG/1.6ML IJ SOLN
480.0000 ug | Freq: Once | INTRAMUSCULAR | Status: AC
  Administered 2012-04-19: 480 ug via SUBCUTANEOUS
  Filled 2012-04-19: qty 1.6

## 2012-04-19 MED ORDER — DILTIAZEM HCL ER COATED BEADS 300 MG PO CP24: 300.0000 mg | ORAL_CAPSULE | Freq: Every day | ORAL | Status: DC

## 2012-04-19 NOTE — Assessment & Plan Note (Signed)
Ventricular rate in atrial fibrillation a slightly elevated; diltiazem dosage will be increased to 300 mg per day to further increase AV nodal block

## 2012-04-19 NOTE — Assessment & Plan Note (Signed)
Patient is anemic, but has multiple potential causes other than blood loss. Dr. Mariel Sleet is monitoring and treating.

## 2012-04-19 NOTE — Progress Notes (Deleted)
Name: Charles [name]    DOB: Jun 26, 1940  Age: 72 y.o.  MR#: [id number]       PCP:  [name], MD      Insurance: @PAYORNAME @   Medication List  CC:    Chief Complaint  Patient presents with  . Follow-up    Patient states that he has been feeling fatigue alot.    VS BP 95/60  Pulse 85  Ht 5\' 7"  (1.702 m)  Wt 194 lb 0.6 oz (88.016 kg)  BMI 30.39 kg/m2  SpO2 94%  Weights Current Weight  04/19/12 194 lb 0.6 oz (88.016 kg)  04/12/12 194 lb 12.8 oz (88.361 kg)  02/13/12 218 lb (98.884 kg)    Blood Pressure  BP Readings from Last 3 Encounters:  04/19/12 95/60  04/18/12 104/67  04/17/12 82/43     Admit date:  (Not on file) Last encounter with RMR:  01/30/2012   Allergy Allergies  Allergen Reactions  . Statins     Intolerant secondary to severe myalgias    Current Outpatient Prescriptions  Medication Sig Dispense Refill  . ALPRAZolam (XANAX) 1 MG tablet Take 0.5 mg by mouth 4 (four) times daily.       . carvedilol (COREG) 25 MG tablet Take 25 mg by mouth 2 (two) times daily. 1/2 tablet if BP is below 10/70       . diltiazem (CARDIZEM CD) 240 MG 24 hr capsule TAKE ONE CAPSULE BY MOUTH DAILY  30 capsule  6  . doxycycline (DORYX) 100 MG EC tablet Take 100 mg by mouth 2 (two) times daily.      . furosemide (LASIX) 40 MG tablet Take 40 mg by mouth as needed. 1/2 tablet as needed      . HYDROcodone-acetaminophen (NORCO/VICODIN) 5-325 MG per tablet Take 1 tablet by mouth every 6 (six) hours as needed.      Marland Kitchen lisinopril (PRINIVIL,ZESTRIL) 10 MG tablet Take 10 mg by mouth daily.      Marland Kitchen lovastatin (MEVACOR) 10 MG tablet Take 10 mg by mouth daily.       Marland Kitchen omeprazole (PRILOSEC) 20 MG capsule Take 20 mg by mouth daily.      . potassium chloride SA (K-DUR,KLOR-CON) 20 MEQ tablet Take 20 mEq by mouth as needed.      Marland Kitchen PROAIR HFA 108 (90 BASE) MCG/ACT inhaler Inhale 1 puff into the lungs every 6 (six) hours as needed. For shortness of breath      . traMADol (ULTRAM) 50 MG  tablet Take 50-100 mg by mouth 4 (four) times daily as needed. For pain      . warfarin (COUMADIN) 5 MG tablet Take 2.5-5 mg by mouth as directed. patient takes 1/2 tablet(2.5mg )       No current facility-administered medications for this visit.   Facility-Administered Medications Ordered in Other Visits  Medication Dose Route Frequency Provider Last Rate Last Dose  . filgrastim (NEUPOGEN) injection 480 mcg  480 mcg Subcutaneous Once Randall An, MD      . sodium chloride 0.9 % injection 10 mL  10 mL Intracatheter PRN Randall An, MD   10 mL at 10/15/10 1230    Discontinued Meds:    Medications Discontinued During This Encounter  Medication Reason  . HYDROcodone-homatropine (HYCODAN) 5-1.5 MG/5ML syrup Error  . LORazepam (ATIVAN) 1 MG tablet Error  . Nutritional Supplements (ENSURE PO) Error  . carvedilol (COREG) 25 MG tablet   . potassium chloride SA (K-DUR,KLOR-CON) 20 MEQ tablet   .  HYDROcodone-acetaminophen (NORCO/VICODIN) 5-325 MG per tablet     Patient Active Problem List  Diagnosis  . THYROID STIMULATING HORMONE, ABNORMAL  . TOBACCO ABUSE  . INSOMNIA, CHRONIC  . HYPERTENSION  . GASTRIC ULCER, HX OF  . Atrial fibrillation  . Chronic anticoagulation  . Follicular lymphoma  . Mixed hyperlipidemia  . Septic shock(785.52)  . Pneumonia  . Bacteremia due to Pseudomonas    LABS Office Visit on 04/12/2012  Component Date Value  . WBC 04/12/2012 2.2*  . RBC 04/12/2012 3.56*  . Hemoglobin 04/12/2012 9.8*  . HCT 04/12/2012 30.4*  . MCV 04/12/2012 85.4   . [facility] 04/12/2012 27.5   . MCHC 04/12/2012 32.2   . RDW 04/12/2012 18.2*  . Platelets 04/12/2012 254   . Neutrophils Relative 04/12/2012 18*  . Neutro Abs 04/12/2012 0.4*  . Lymphocytes Relative 04/12/2012 29   . Lymphs Abs 04/12/2012 0.7   . Monocytes Relative 04/12/2012 32*  . Monocytes Absolute 04/12/2012 0.7   . Eosinophils Relative 04/12/2012 18*  . Eosinophils Absolute 04/12/2012 0.4   . Basophils  Relative 04/12/2012 3*  . Basophils Absolute 04/12/2012 0.1   . Sodium 04/12/2012 137   . Potassium 04/12/2012 4.5   . Chloride 04/12/2012 101   . CO2 04/12/2012 27   . Glucose, Bld 04/12/2012 105*  . BUN 04/12/2012 21   . Creatinine, Ser 04/12/2012 1.02   . Calcium 04/12/2012 9.5   . Total Protein 04/12/2012 7.1   . Albumin 04/12/2012 3.3*  . AST 04/12/2012 11   . ALT 04/12/2012 8   . Alkaline Phosphatase 04/12/2012 104   . Total Bilirubin 04/12/2012 0.4   . GFR calc non Af Amer 04/12/2012 71*  . GFR calc Af Amer 04/12/2012 83*  . LDH 04/12/2012 126   . IgG (Immunoglobin G), Se* 04/12/2012 1060   . IgA 04/12/2012 344   . IgM, Serum 04/12/2012 76   Anti-coag visit on 03/29/2012  Component Date Value  . INR 03/29/2012 2.5   . INR 04/05/2012 2.0   Anti-coag visit on 03/15/2012  Component Date Value  . INR 03/15/2012 3.5   Anti-coag visit on 03/08/2012  Component Date Value  . INR 03/08/2012 2.5   Anti-coag visit on 03/01/2012  Component Date Value  . INR 03/01/2012 2.1   Anti-coag visit on 02/26/2012  Component Date Value  . INR 02/26/2012 2.7   Anti-coag visit on 02/23/2012  Component Date Value  . INR 02/23/2012 4.5   Anti-coag visit on 02/18/2012  Component Date Value  . INR 02/18/2012 3.4   Anti-coag visit on 02/16/2012  Component Date Value  . INR 02/16/2012 6.2   Infusion on 02/16/2012  Component Date Value  . ABO/RH(D) 02/16/2012 O POS   . Antibody Screen 02/16/2012 NEG   . Sample Expiration 02/16/2012 02/19/2012   . Unit Number 02/16/2012 [id number]   . Blood Component Type 02/16/2012 RED CELLS,LR   . Unit division 02/16/2012 00   . Status of Unit 02/16/2012 ISSUED,FINAL   . Transfusion Status 02/16/2012 OK TO TRANSFUSE   . Crossmatch Result 02/16/2012 Compatible   . Unit Number 02/16/2012 [id number]   . Blood Component Type 02/16/2012 RED CELLS,LR   . Unit division 02/16/2012 00   . Status of Unit 02/16/2012 ISSUED,FINAL   . Transfusion  Status 02/16/2012 OK TO TRANSFUSE   . Crossmatch Result 02/16/2012 Compatible   . Order Confirmation 02/16/2012 ORDER PROCESSED BY BLOOD BANK   There may be more visits with results that are not included.  Results for this Opt Visit:     Results for orders placed in visit on 04/12/12  CBC WITH DIFFERENTIAL      Component Value Range   WBC 2.2 (*) 4.0 - 10.5 K/uL   RBC 3.56 (*) 4.22 - 5.81 MIL/uL   Hemoglobin 9.8 (*) 13.0 - 17.0 g/dL   HCT 16.1 (*) 09.6 - 04.5 %   MCV 85.4  78.0 - 100.0 fL   MCH 27.5  26.0 - 34.0 pg   MCHC 32.2  30.0 - 36.0 g/dL   RDW 40.9 (*) 81.1 - 91.4 %   Platelets 254  150 - 400 K/uL   Neutrophils Relative 18 (*) 43 - 77 %   Neutro Abs 0.4 (*) 1.7 - 7.7 K/uL   Lymphocytes Relative 29  12 - 46 %   Lymphs Abs 0.7  0.7 - 4.0 K/uL   Monocytes Relative 32 (*) 3 - 12 %   Monocytes Absolute 0.7  0.1 - 1.0 K/uL   Eosinophils Relative 18 (*) 0 - 5 %   Eosinophils Absolute 0.4  0.0 - 0.7 K/uL   Basophils Relative 3 (*) 0 - 1 %   Basophils Absolute 0.1  0.0 - 0.1 K/uL  COMPREHENSIVE METABOLIC PANEL      Component Value Range   Sodium 137  135 - 145 mEq/L   Potassium 4.5  3.5 - 5.1 mEq/L   Chloride 101  96 - 112 mEq/L   CO2 27  19 - 32 mEq/L   Glucose, Bld 105 (*) 70 - 99 mg/dL   BUN 21  6 - 23 mg/dL   Creatinine, Ser 7.82  0.50 - 1.35 mg/dL   Calcium 9.5  8.4 - 95.6 mg/dL   Total Protein 7.1  6.0 - 8.3 g/dL   Albumin 3.3 (*) 3.5 - 5.2 g/dL   AST 11  0 - 37 U/L   ALT 8  0 - 53 U/L   Alkaline Phosphatase 104  39 - 117 U/L   Total Bilirubin 0.4  0.3 - 1.2 mg/dL   GFR calc non Af Amer 71 (*) >90 mL/min   GFR calc Af Amer 83 (*) >90 mL/min  LACTATE DEHYDROGENASE      Component Value Range   LDH 126  94 - 250 U/L  IGG, IGA, IGM      Component Value Range   IgG (Immunoglobin G), Serum 1060  650 - 1600 mg/dL   IgA 213  68 - 086 mg/dL   IgM, Serum 76  41 - 578 mg/dL    EKG Orders placed during the hospital encounter of 01/30/12  . ED EKG  . ED EKG  . EKG  12-LEAD  . EKG 12-LEAD  . EKG     Prior Assessment and Plan Problem List as of 04/19/2012            Cardiology Problems   Mixed hyperlipidemia   Last Assessment & Plan Note   04/17/2011 Office Visit Signed 04/17/2011  3:26 PM by Jodelle Gross, NP    He is intolerant to statin therapy secondary to severe myalgias. Will start him on Fish Oil 1500 mg daily and Niaspan 500 mg at HS. He is given warning of possible flushing with niaspan. He will have follow-up lipid study in 3 months. He is advised to watch his cholesterol intake with reduced fat diet.    HYPERTENSION   Last Assessment & Plan Note   10/15/2011 Office Visit Signed 10/15/2011  5:57 PM  by Jodelle Gross, NP    Excellent control of BP.  No changes at this time.    Atrial fibrillation   Last Assessment & Plan Note   10/15/2011 Office Visit Signed 10/15/2011  5:56 PM by Jodelle Gross, NP    Rate is well controlled. He is medically complaint. INR was checked today while he was in the exam room. He will continue current medications and follow-up coumadin clinic appointments. He is encouraged to continue swimming and exercising as this is making him feel better and will help with weight loss.    Septic shock(785.52)     Other   THYROID STIMULATING HORMONE, ABNORMAL   TOBACCO ABUSE   Last Assessment & Plan Note   04/17/2011 Office Visit Signed 04/17/2011  3:27 PM by Jodelle Gross, NP    He has cut down for 1 1/2 packs to 3 cigarettes a day. He is encouraged to quit completely.    INSOMNIA, CHRONIC   GASTRIC ULCER, HX OF   Chronic anticoagulation   Last Assessment & Plan Note   12/26/2010 Office Visit Signed 12/26/2010 12:17 PM by Kathlen Brunswick, MD    He has done very well during treatment with warfarin.  Colonoscopy 3 years ago was reportedly negative.  We will continue to collect annual stool Hemoccults and CBCs.    Follicular lymphoma   Last Assessment & Plan Note   12/26/2010 Office Visit Signed 12/26/2010  12:17 PM by Kathlen Brunswick, MD    Patient appears to be doing well with therapy directed by Dr. Mariel Sleet.    Pneumonia   Bacteremia due to Pseudomonas       Imaging: Ct Chest W Contrast  04/13/2012  *RADIOLOGY REPORT*  Clinical Data: Persistent lung infection and history of lymphoma. Question obstruction versus lymphoma.  History of "bone cancer."  CT CHEST WITH CONTRAST  Technique:  Multidetector CT imaging of the chest was performed following the standard protocol during bolus administration of intravenous contrast.  Contrast: 80mL OMNIPAQUE IOHEXOL 300 MG/ML  SOLN  Comparison: Plain film 02/03/2012.  Most recent CT 09/08/2011.  P E T of 06/06/2011.  Findings: Lungs/pleura: Beam hardening artifact superiorly from right proximal humerus fixation.  Patent endobronchial tree, including to the lung bases.  There is mild nonspecific bronchial wall thickening at the lung bases.  Mild right upper lobe reticular nodular opacities on image 26/series 3. Minimal right middle lobe volume loss posteriorly, favored to represent atelectasis.  Image 36/series 3. Minimal nonspecific interstitial prominence at the right lung base.  Confluent airspace and reticulonodular opacity at the left lung base.  Trace right-sided pleural fluid or thickening which is new since 09/08/2011.  Heart/Mediastinum: A left-sided Port-A-Cath which terminates at the left brachiocephalic l vein, similar.  Tortuous descending thoracic aorta.  Mild cardiomegaly with left atrial enlargement. Multivessel coronary artery atherosclerosis.  No mediastinal or hilar adenopathy.  Upper abdomen: Probable cyst adjacent the falciform ligament within the left lobe of the liver.  Tiny perfusion anomaly in the left hepatic dome.  Mild right adrenal nodularity which is chronic. Left renal cysts.  Bones/Musculoskeletal:  No acute osseous abnormality.  IMPRESSION:  1.  Left greater than right airspace and reticulonodular opacities. Most consistent with  infection or less likely aspiration.  No evidence of endobronchial obstruction, adenopathy, or other findings to suggest recurrent lymphoma within the chest. 2.  Trace right-sided pleural fluid or thickening. 3. Multivessel coronary artery atherosclerosis.   Original Report Authenticated By: Jeronimo Greaves, M.D.  FRS Calculation: Score not calculated. Missing: Total Cholesterol

## 2012-04-19 NOTE — Assessment & Plan Note (Addendum)
Adequate control of hyperlipidemia when last assessed 8 months ago; although current dose of lovastatin is low, pharmacologic response is adequate, and the patient has had myalgias with higher dose statins in the past.  Current therapy will be continued.Marland Kitchen

## 2012-04-19 NOTE — Assessment & Plan Note (Signed)
Blood pressure control is good; in fact, systolic blood pressures on the low side. To permit an increase in diltiazem for better control of heart rate, lisinopril will be discontinued.

## 2012-04-19 NOTE — Patient Instructions (Addendum)
Your physician recommends that you schedule a follow-up appointment in: 4 months  Check on insurance coverage regarding Xarelto or Pradaxa  Your physician recommends that you schedule a follow-up appointment in:  1 - STOP Lisinopril 2 - INCREASE Diltiazem to 300 mg daily

## 2012-04-19 NOTE — Assessment & Plan Note (Signed)
Case discussed with Dr. Mariel Sleet. Patient is doing well with no evidence for active disease and a negative PET scan in the recent past

## 2012-04-19 NOTE — Assessment & Plan Note (Addendum)
Patient congratulated on decreasing tobacco consumption, but advised of the importance of quitting entirely.  He reports that he is in the process of doing so.

## 2012-04-19 NOTE — Progress Notes (Signed)
Charles [name] presents today for injection per MD orders. Neupogen 480 mcg administered SQ in right Abdomen. Administration without incident. Patient tolerated well.  

## 2012-04-19 NOTE — Progress Notes (Signed)
Patient ID: Charles [name], male   DOB: 10-07-1940, 72 y.o.   MRN: [id number]  HPI: Scheduled return visit for this very nice gentleman with chronic atrial fibrillation requiring anticoagulation, hypertension and hyperlipidemia. Since his last visit, he has suffered 2 episodes of pneumonia, has been treated with multiple courses of antibiotics and is currently receiving leukocyte stimulating factor. The latter therapy has resulted in decreased cough and sputum production and an increased sense of well-being. He denies fever or rigors.  He reports being unaware of his chronic hematologic abnormalities.  Prior to Admission medications   Medication Sig Start Date End Date Taking? Authorizing Provider  ALPRAZolam Prudy Feeler) 1 MG tablet Take 0.5 mg by mouth 4 (four) times daily.    Yes Historical Provider, MD  carvedilol (COREG) 25 MG tablet Take 25 mg by mouth 2 (two) times daily. 1/2 tablet if BP is below 10/70  03/12/12  Yes Kathlen Brunswick, MD  diltiazem (CARDIZEM CD) 300 MG 24 hr capsule Take 1 capsule (300 mg total) by mouth daily. 04/19/12  Yes Kathlen Brunswick, MD  doxycycline (DORYX) 100 MG EC tablet Take 100 mg by mouth 2 (two) times daily.   Yes Historical Provider, MD  furosemide (LASIX) 40 MG tablet Take 40 mg by mouth as needed. 1/2 tablet as needed   Yes Historical Provider, MD  HYDROcodone-acetaminophen (NORCO/VICODIN) 5-325 MG per tablet Take 1 tablet by mouth every 6 (six) hours as needed. 02/13/12  Yes Ellouise Newer, PA  lovastatin (MEVACOR) 10 MG tablet Take 10 mg by mouth daily.    Yes Historical Provider, MD  omeprazole (PRILOSEC) 20 MG capsule Take 20 mg by mouth daily.   Yes Historical Provider, MD  potassium chloride SA (K-DUR,KLOR-CON) 20 MEQ tablet Take 20 mEq by mouth as needed. 02/13/12  Yes Thomas S Kefalas, PA  PROAIR HFA 108 (90 BASE) MCG/ACT inhaler Inhale 1 puff into the lungs every 6 (six) hours as needed. For shortness of breath 01/15/12  Yes Historical Provider, MD    traMADol (ULTRAM) 50 MG tablet Take 50-100 mg by mouth 4 (four) times daily as needed. For pain   Yes Historical Provider, MD  warfarin (COUMADIN) 5 MG tablet Take 2.5-5 mg by mouth as directed. patient takes 1/2 tablet(2.5mg ) 05/20/11  Yes Jodelle Gross, NP   Allergies  Allergen Reactions  . Statins     Intolerant secondary to severe myalgias  Past medical history, social history, and family history reviewed and updated.  ROS: Denies chest pain, orthopnea, PND, palpitations, lightheadedness or syncope. He notes pain in his knees and feet but no symptoms suggestive of claudication.  He has had intermittent pedal edema that is currently improved.  PHYSICAL EXAM: BP 95/60  Pulse 85  Ht 5\' 7"  (1.702 m)  Wt 88.016 kg (194 lb 0.6 oz)  BMI 30.39 kg/m2  SpO2 94%  General-Well developed; no acute distress; pallor; appears chronically ill Body habitus-proportionate weight and height Neck-No JVD; no carotid bruits Lungs-clear lung fields; resonant to percussion Cardiovascular-normal PMI; normal S1 and S2; irregular; mild tachycardia Abdomen-normal bowel sounds; soft and non-tender without masses or organomegaly Musculoskeletal-No deformities, no cyanosis or clubbing Neurologic-Normal cranial nerves; symmetric strength and tone Skin-Warm, no significant lesions Extremities-distal pulses intact; 1+ ankle edema bilaterally  Rhythm Strip: atrial fibrillation with a slightly rapid ventricular response of 103 bpm; PVCs versus aberrancy  ASSESSMENT AND PLAN:  [place] Bing, MD 04/19/2012 12:29 PM

## 2012-04-20 ENCOUNTER — Encounter (HOSPITAL_BASED_OUTPATIENT_CLINIC_OR_DEPARTMENT_OTHER): Payer: Medicare Other

## 2012-04-20 VITALS — BP 118/66 | HR 102

## 2012-04-20 DIAGNOSIS — D702 Other drug-induced agranulocytosis: Secondary | ICD-10-CM

## 2012-04-20 DIAGNOSIS — J189 Pneumonia, unspecified organism: Secondary | ICD-10-CM

## 2012-04-20 DIAGNOSIS — D649 Anemia, unspecified: Secondary | ICD-10-CM

## 2012-04-20 DIAGNOSIS — C8589 Other specified types of non-Hodgkin lymphoma, extranodal and solid organ sites: Secondary | ICD-10-CM

## 2012-04-20 MED ORDER — FILGRASTIM 480 MCG/1.6ML IJ SOLN
480.0000 ug | Freq: Once | INTRAMUSCULAR | Status: AC
  Administered 2012-04-20: 480 ug via SUBCUTANEOUS
  Filled 2012-04-20: qty 1.6

## 2012-04-20 NOTE — Progress Notes (Signed)
Charles [name] presents today for injection per MD orders. Neupogen 480 mcg administered SQ in left Abdomen. Administration without incident. Patient tolerated well.  

## 2012-04-27 ENCOUNTER — Encounter (HOSPITAL_COMMUNITY): Payer: Medicare Other | Attending: Oncology | Admitting: Oncology

## 2012-04-27 ENCOUNTER — Encounter (HOSPITAL_COMMUNITY): Payer: Self-pay | Admitting: Oncology

## 2012-04-27 VITALS — BP 112/69 | HR 98 | Temp 98.2°F | Resp 20 | Wt 198.8 lb

## 2012-04-27 DIAGNOSIS — J189 Pneumonia, unspecified organism: Secondary | ICD-10-CM | POA: Insufficient documentation

## 2012-04-27 DIAGNOSIS — C8299 Follicular lymphoma, unspecified, extranodal and solid organ sites: Secondary | ICD-10-CM | POA: Insufficient documentation

## 2012-04-27 DIAGNOSIS — D709 Neutropenia, unspecified: Secondary | ICD-10-CM | POA: Insufficient documentation

## 2012-04-27 NOTE — Patient Instructions (Addendum)
[facility] Discharge Instructions  RECOMMENDATIONS MADE BY THE CONSULTANT AND ANY TEST RESULTS WILL BE SENT TO YOUR REFERRING PHYSICIAN.  EXAM FINDINGS BY THE PHYSICIAN TODAY AND SIGNS OR SYMPTOMS TO REPORT TO CLINIC OR PRIMARY PHYSICIAN: Exam and discussion by MD.  Bonita Quin can take the iron pill on Mondays, Wednesdays and Fridays.  Call us after you have the chest xray.  MEDICATIONS PRESCRIBED:  none  INSTRUCTIONS GIVEN AND DISCUSSED: Chest xray in 2 weeks - 05/11/12  SPECIAL INSTRUCTIONS/FOLLOW-UP: Keep your scheduled appointments.  Thank you for choosing [facility] to provide your oncology and hematology care.  To afford each patient quality time with our providers, please arrive at least 15 minutes before your scheduled appointment time.  With your help, our goal is to use those 15 minutes to complete the necessary work-up to ensure our physicians have the information they need to help with your evaluation and healthcare recommendations.    Effective January 1st, 2014, we ask that you re-schedule your appointment with our physicians should you arrive 10 or more minutes late for your appointment.  We strive to give you quality time with our providers, and arriving late affects you and other patients whose appointments are after yours.    Again, thank you for choosing [facility].  Our hope is that these requests will decrease the amount of time that you wait before being seen by our physicians.       _____________________________________________________________  Should you have questions after your visit to [facility], please contact our office at [phone] between the hours of 8:30 a.m. and 5:00 p.m.  Voicemails left after 4:30 p.m. will not be returned until the following business day.  For prescription refill requests, have your pharmacy contact our office with your prescription refill request.

## 2012-04-27 NOTE — Progress Notes (Signed)
Problem number 1 recent pneumonia. He initially had pneumonia on the right in November but now has disease of the left. He was on antibiotics which he finished. His sputum is much clearer he states. It is Still slightly discolored occasionally. He has no fever or chills and his breathing clearly much better both he and his wife state.  We will try to substantiate his improvement via chest x-ray in 2 weeks. He has quit smoking altogether which he needs to continue. His wife will check with [place] after the chest x-ray in 2 weeks. His lungs today still show rhonchi bilaterally with a few rales at the left base. He is not febrile and clearly looks better and did not cough the entire time during the exam. I believe the Neupogen clearly helped his white count by office infection. We had checked his immunoglobulins which were not low and therefore we could not justify IVIG. He clearly however had leukopenia/neutropenia.

## 2012-04-29 ENCOUNTER — Ambulatory Visit (INDEPENDENT_AMBULATORY_CARE_PROVIDER_SITE_OTHER): Payer: Medicare Other | Admitting: *Deleted

## 2012-04-29 DIAGNOSIS — Z7901 Long term (current) use of anticoagulants: Secondary | ICD-10-CM

## 2012-04-29 DIAGNOSIS — I4891 Unspecified atrial fibrillation: Secondary | ICD-10-CM

## 2012-05-03 ENCOUNTER — Ambulatory Visit (INDEPENDENT_AMBULATORY_CARE_PROVIDER_SITE_OTHER): Payer: Medicare Other | Admitting: *Deleted

## 2012-05-03 DIAGNOSIS — I4891 Unspecified atrial fibrillation: Secondary | ICD-10-CM

## 2012-05-03 DIAGNOSIS — Z7901 Long term (current) use of anticoagulants: Secondary | ICD-10-CM

## 2012-05-07 ENCOUNTER — Encounter (HOSPITAL_COMMUNITY): Payer: Self-pay

## 2012-05-07 ENCOUNTER — Emergency Department (HOSPITAL_COMMUNITY): Payer: Medicare Other

## 2012-05-07 ENCOUNTER — Inpatient Hospital Stay (HOSPITAL_COMMUNITY)
Admission: EM | Admit: 2012-05-07 | Discharge: 2012-05-11 | DRG: 194 | Disposition: A | Discharge status: Home / Self Care | Payer: Medicare Other | Attending: Internal Medicine | Admitting: Internal Medicine

## 2012-05-07 DIAGNOSIS — I10 Essential (primary) hypertension: Secondary | ICD-10-CM | POA: Diagnosis present

## 2012-05-07 DIAGNOSIS — Z87891 Personal history of nicotine dependence: Secondary | ICD-10-CM

## 2012-05-07 DIAGNOSIS — M19019 Primary osteoarthritis, unspecified shoulder: Secondary | ICD-10-CM | POA: Diagnosis present

## 2012-05-07 DIAGNOSIS — Z888 Allergy status to other drugs, medicaments and biological substances status: Secondary | ICD-10-CM

## 2012-05-07 DIAGNOSIS — Z8249 Family history of ischemic heart disease and other diseases of the circulatory system: Secondary | ICD-10-CM

## 2012-05-07 DIAGNOSIS — G4700 Insomnia, unspecified: Secondary | ICD-10-CM

## 2012-05-07 DIAGNOSIS — F17200 Nicotine dependence, unspecified, uncomplicated: Secondary | ICD-10-CM | POA: Diagnosis present

## 2012-05-07 DIAGNOSIS — E079 Disorder of thyroid, unspecified: Secondary | ICD-10-CM

## 2012-05-07 DIAGNOSIS — I428 Other cardiomyopathies: Secondary | ICD-10-CM | POA: Diagnosis present

## 2012-05-07 DIAGNOSIS — D649 Anemia, unspecified: Secondary | ICD-10-CM | POA: Diagnosis present

## 2012-05-07 DIAGNOSIS — I4891 Unspecified atrial fibrillation: Secondary | ICD-10-CM

## 2012-05-07 DIAGNOSIS — C8290 Follicular lymphoma, unspecified, unspecified site: Secondary | ICD-10-CM

## 2012-05-07 DIAGNOSIS — Z8711 Personal history of peptic ulcer disease: Secondary | ICD-10-CM

## 2012-05-07 DIAGNOSIS — Z79899 Other long term (current) drug therapy: Secondary | ICD-10-CM

## 2012-05-07 DIAGNOSIS — F411 Generalized anxiety disorder: Secondary | ICD-10-CM | POA: Diagnosis present

## 2012-05-07 DIAGNOSIS — C8299 Follicular lymphoma, unspecified, extranodal and solid organ sites: Secondary | ICD-10-CM | POA: Diagnosis present

## 2012-05-07 DIAGNOSIS — M1710 Unilateral primary osteoarthritis, unspecified knee: Secondary | ICD-10-CM | POA: Diagnosis present

## 2012-05-07 DIAGNOSIS — J189 Pneumonia, unspecified organism: Principal | ICD-10-CM

## 2012-05-07 DIAGNOSIS — Z9221 Personal history of antineoplastic chemotherapy: Secondary | ICD-10-CM

## 2012-05-07 DIAGNOSIS — Z7901 Long term (current) use of anticoagulants: Secondary | ICD-10-CM

## 2012-05-07 DIAGNOSIS — D709 Neutropenia, unspecified: Secondary | ICD-10-CM

## 2012-05-07 DIAGNOSIS — E785 Hyperlipidemia, unspecified: Secondary | ICD-10-CM

## 2012-05-07 LAB — URINALYSIS, ROUTINE W REFLEX MICROSCOPIC
Bilirubin Urine: NEGATIVE
Glucose, UA: NEGATIVE mg/dL
Ketones, ur: NEGATIVE mg/dL
Leukocytes, UA: NEGATIVE
Nitrite: NEGATIVE
Protein, ur: NEGATIVE mg/dL
Specific Gravity, Urine: 1.015 (ref 1.005–1.030)
Urobilinogen, UA: 0.2 mg/dL (ref 0.0–1.0)
pH: 6.5 (ref 5.0–8.0)

## 2012-05-07 LAB — CBC WITH DIFFERENTIAL/PLATELET
Band Neutrophils: 0 % (ref 0–10)
Basophils Absolute: 0 K/uL (ref 0.0–0.1)
Basophils Relative: 0 % (ref 0–1)
Blasts: 0 %
Eosinophils Absolute: 0.2 K/uL (ref 0.0–0.7)
Eosinophils Relative: 18 % — ABNORMAL HIGH (ref 0–5)
HCT: 35.1 % — ABNORMAL LOW (ref 39.0–52.0)
Hemoglobin: 11.4 g/dL — ABNORMAL LOW (ref 13.0–17.0)
Lymphocytes Relative: 32 % (ref 12–46)
Lymphs Abs: 0.3 10*3/uL — ABNORMAL LOW (ref 0.7–4.0)
MCH: 27.9 pg (ref 26.0–34.0)
MCHC: 32.5 g/dL (ref 30.0–36.0)
MCV: 86 fL (ref 78.0–100.0)
Metamyelocytes Relative: 0 %
Monocytes Absolute: 0 K/uL — ABNORMAL LOW (ref 0.1–1.0)
Monocytes Relative: 4 % (ref 3–12)
Myelocytes: 0 %
Neutro Abs: 0.4 10*3/uL — ABNORMAL LOW (ref 1.7–7.7)
Neutrophils Relative %: 46 % (ref 43–77)
Platelets: 210 10*3/uL (ref 150–400)
Promyelocytes Absolute: 0 %
RBC: 4.08 MIL/uL — ABNORMAL LOW (ref 4.22–5.81)
RDW: 18.1 % — ABNORMAL HIGH (ref 11.5–15.5)
WBC: 0.9 K/uL — CL (ref 4.0–10.5)
nRBC: 0 /100 WBC

## 2012-05-07 LAB — BASIC METABOLIC PANEL WITH GFR
BUN: 13 mg/dL (ref 6–23)
Chloride: 101 meq/L (ref 96–112)
GFR calc Af Amer: >90 mL/min (ref >90)
GFR calc non Af Amer: 84 mL/min — ABNORMAL LOW (ref >90)
Potassium: 4.2 meq/L (ref 3.5–5.1)
Sodium: 138 meq/L (ref 135–145)

## 2012-05-07 LAB — URINE MICROSCOPIC-ADD ON

## 2012-05-07 LAB — BASIC METABOLIC PANEL
CO2: 26 mEq/L (ref 19–32)
Calcium: 9.2 mg/dL (ref 8.4–10.5)
Creatinine, Ser: 0.88 mg/dL (ref 0.50–1.35)
Glucose, Bld: 127 mg/dL — ABNORMAL HIGH (ref 70–99)

## 2012-05-07 LAB — CG4 I-STAT (LACTIC ACID): Lactic Acid, Venous: 2.52 mmol/L — ABNORMAL HIGH (ref 0.5–2.2)

## 2012-05-07 MED ORDER — DILTIAZEM HCL 100 MG IV SOLR
5.0000 mg/h | Freq: Once | INTRAVENOUS | Status: AC
  Administered 2012-05-08: 5 mg/h via INTRAVENOUS
  Filled 2012-05-07: qty 100

## 2012-05-07 MED ORDER — VANCOMYCIN HCL 10 G IV SOLR
1250.0000 mg | Freq: Two times a day (BID) | INTRAVENOUS | Status: AC
  Administered 2012-05-08 – 2012-05-10 (×5): 1250 mg via INTRAVENOUS
  Filled 2012-05-07 (×7): qty 1250

## 2012-05-07 MED ORDER — DEXTROSE 5 % IV SOLN
1.0000 g | Freq: Three times a day (TID) | INTRAVENOUS | Status: DC
  Filled 2012-05-07 (×3): qty 1

## 2012-05-07 MED ORDER — LEVOFLOXACIN IN D5W 750 MG/150ML IV SOLN
750.0000 mg | INTRAVENOUS | Status: DC
  Administered 2012-05-08: 750 mg via INTRAVENOUS
  Filled 2012-05-07: qty 150

## 2012-05-07 MED ORDER — CEFEPIME HCL 1 G IJ SOLR: 1.0000 g | Freq: Three times a day (TID) | INTRAMUSCULAR | Status: DC

## 2012-05-07 MED ORDER — MORPHINE SULFATE 4 MG/ML IJ SOLN
4.0000 mg | Freq: Once | INTRAMUSCULAR | Status: AC
  Administered 2012-05-07: 4 mg via INTRAVENOUS
  Filled 2012-05-07: qty 1

## 2012-05-07 MED ORDER — SODIUM CHLORIDE 0.9 % IV BOLUS (SEPSIS)
1000.0000 mL | Freq: Once | INTRAVENOUS | Status: AC
  Administered 2012-05-07: 1000 mL via INTRAVENOUS

## 2012-05-07 MED ORDER — ACETAMINOPHEN 325 MG PO TABS
650.0000 mg | ORAL_TABLET | Freq: Once | ORAL | Status: AC
  Administered 2012-05-07: 650 mg via ORAL
  Filled 2012-05-07: qty 2

## 2012-05-07 MED ORDER — VANCOMYCIN HCL 10 G IV SOLR
1250.0000 mg | Freq: Once | INTRAVENOUS | Status: AC
  Administered 2012-05-08: 1250 mg via INTRAVENOUS
  Filled 2012-05-07 (×2): qty 1250

## 2012-05-07 NOTE — ED Provider Notes (Addendum)
History     CSN: [id number]  Arrival date & time 05/07/12  2118   First MD Initiated Contact with Patient 05/07/12 2218      Chief Complaint  Patient presents with  . Chest Pain  . Atrial Fibrillation    (Consider location/radiation/quality/duration/timing/severity/associated sxs/prior treatment) HPI Comments: Patient with a history of chronic atrial fibrillation, leukemia currently receiving treatment, mild COPD and is on chronic Coumadin reports generalized fatigue, malaise and overall weakness for 1 day. He has had problems with pneumonia a few month ago. Patient reports that he has had a intermittent cough that recently has become more productive it is a brown in coloration. Patient reports she is feeling run down yesterday but this morning his appetite and energy level seems fine. He did go out and do some errands with his family. However he had a sudden decrease in his energy level and began having chills later this afternoon and presented to the emergency department. Patient denies vomiting or diarrhea. Recently his appetite has been good. He denies headache, sinus pressure, stiff neck, skin rash.  Patient is a 72 y.o. male presenting with chest pain and atrial fibrillation. The history is provided by the patient, the spouse, a relative and medical records.  Chest Pain Associated symptoms: cough, fatigue, fever, shortness of breath and weakness   Associated symptoms: no nausea and not vomiting   Atrial Fibrillation Associated symptoms include chest pain and shortness of breath.    Past Medical History  Diagnosis Date  . Hypertension   . Atrial fibrillation 10/2008  . Nonischemic cardiomyopathy 02/2009    Presented with CHF; EF of 25-30%; Nonobstructive ASCVD-worst lesion 40% on coronary angiography  . Peptic ulcer disease   . Degenerative joint disease     Knees and shoulders  . Pneumonia 2010  . Low TSH level     Borderline  . Allergic rhinitis   . Anxiety   . Drug  abuse   . Tobacco abuse     50 pack years  . 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 Basal cell carcinoma     Past Surgical History  Procedure Laterality Date  . Neck lesion biopsy  June 24, 2010    Follicular lymphoma  . Skin cancer excision      under left breast; variously described as melanoma and basal cell  . Rotator cuff repair      right  . Patella fracture surgery  1975  . Hematoma evacuation      after melanoma removal  . Portacath placement  07/12/2010  . Colonoscopy  2009    Also underwent an EGD; patient reports no significant findings    Family History  Problem Relation Age of Onset  . Heart attack Father   . Coronary artery disease Other     History  Substance Use Topics  . Smoking status: Former Smoker -- 1.00 packs/day for 50 years    Types: Cigarettes    Quit date: 04/12/2012  . Smokeless tobacco: Not on file  . Alcohol Use: No      Review of Systems  Constitutional: Positive for fever, chills and fatigue.  HENT: Negative for neck stiffness.   Respiratory: Positive for cough and shortness of breath.   Cardiovascular: Positive for chest pain.  Gastrointestinal: Negative for nausea and vomiting.  Skin: Negative for rash.  Neurological: Positive for weakness.  All other systems reviewed and are negative.    Allergies  Statins  Home Medications   Current Outpatient Rx  Name  Route  Sig  Dispense  Refill  . ALPRAZolam (XANAX) 1 MG tablet   Oral   Take 0.5 mg by mouth 4 (four) times daily.          . carvedilol (COREG) 25 MG tablet   Oral   Take 25 mg by mouth 2 (two) times daily. 1/2 tablet if BP is below 10/70          . diltiazem (CARDIZEM CD) 300 MG 24 hr capsule   Oral   Take 1 capsule (300 mg total) by mouth daily.   30 capsule   6   . doxycycline (DORYX) 100 MG EC tablet   Oral   Take 100 mg by mouth 2 (two) times daily.         . furosemide (LASIX) 40 MG tablet   Oral   Take 20-40 mg by mouth daily as needed (for fluid  retention). Pt can take a half to 1 tab as needed for fluid retention         . HYDROcodone-acetaminophen (NORCO/VICODIN) 5-325 MG per tablet   Oral   Take 1 tablet by mouth every 6 (six) hours as needed for pain.          Marland Kitchen lovastatin (MEVACOR) 10 MG tablet   Oral   Take 10 mg by mouth daily.          . Multiple Vitamins-Iron (ONE-TABLET-DAILY/IRON PO)   Oral   Take 1 tablet by mouth daily.         Marland Kitchen omeprazole (PRILOSEC) 20 MG capsule   Oral   Take 20 mg by mouth daily.         . potassium chloride SA (K-DUR,KLOR-CON) 20 MEQ tablet   Oral   Take 20 mEq by mouth daily as needed (takes along with lasix).          Marland Kitchen PROAIR HFA 108 (90 BASE) MCG/ACT inhaler   Inhalation   Inhale 1 puff into the lungs every 6 (six) hours as needed. For shortness of breath         . traMADol (ULTRAM) 50 MG tablet   Oral   Take 50-100 mg by mouth 4 (four) times daily as needed. For pain         . warfarin (COUMADIN) 5 MG tablet   Oral   Take 2.5-5 mg by mouth daily. Pt takes 2.5mg  on  sun, tue, thu , sat and 5 mg all other days           BP 130/68  Pulse 71  Temp(Src) 101.9 F (38.8 C) (Oral)  Resp 23  Ht 5\' 7"  (1.702 m)  Wt 198 lb 13.7 oz (90.2 kg)  BMI 31.14 kg/m2  SpO2 99%  Physical Exam  Vitals reviewed. Constitutional: He is oriented to person, place, and time. He appears well-developed and well-nourished.  HENT:  Head: Normocephalic and atraumatic.  Eyes: No scleral icterus.  Neck: Normal range of motion. Neck supple. No JVD present.  Cardiovascular: Normal pulses.  An irregularly irregular rhythm present. Tachycardia present.   No murmur heard. Pulmonary/Chest: Effort normal. No respiratory distress.  Abdominal: Soft.  Musculoskeletal: He exhibits no edema.  Neurological: He is alert and oriented to person, place, and time. No cranial nerve deficit. He exhibits normal muscle tone. Coordination normal.  Skin: Skin is warm. No rash noted.    ED Course   Procedures (including critical care time)   CRITICAL CARE Performed by: Lear Ng.   Total critical care  time: 40 min  Critical care time was exclusive of separately billable procedures and treating other patients.  Critical care was necessary to treat or prevent imminent or life-threatening deterioration.  Critical care was time spent personally by me on the following activities: development of treatment plan with patient and/or surrogate as well as nursing, discussions with consultants, evaluation of patient's response to treatment, examination of patient, obtaining history from patient or surrogate, ordering and performing treatments and interventions, ordering and review of laboratory studies, ordering and review of radiographic studies, pulse oximetry and re-evaluation of patient's condition.   Labs Reviewed  BASIC METABOLIC PANEL - Abnormal; Notable for the following:    Glucose, Bld 127 (*)    GFR calc non Af Amer 84 (*)    All other components within normal limits  URINALYSIS, ROUTINE W REFLEX MICROSCOPIC - Abnormal; Notable for the following:    Hgb urine dipstick TRACE (*)    All other components within normal limits  CBC WITH DIFFERENTIAL - Abnormal; Notable for the following:    WBC 0.9 (*)    RBC 4.08 (*)    Hemoglobin 11.4 (*)    HCT 35.1 (*)    RDW 18.1 (*)    Eosinophils Relative 18 (*)    Neutro Abs 0.4 (*)    Lymphs Abs 0.3 (*)    Monocytes Absolute 0.0 (*)    All other components within normal limits  PROTIME-INR - Abnormal; Notable for the following:    Prothrombin Time 28.3 (*)    INR 2.83 (*)    All other components within normal limits  CG4 I-STAT (LACTIC ACID) - Abnormal; Notable for the following:    Lactic Acid, Venous 2.52 (*)    All other components within normal limits  URINE CULTURE  CULTURE, BLOOD (ROUTINE X 2)  CULTURE, BLOOD (ROUTINE X 2)  URINE MICROSCOPIC-ADD ON   Dg Chest Port 1 View  05/07/2012  *RADIOLOGY REPORT*  Clinical  Data: Fever and cough.  History of smoking.  PORTABLE CHEST - 1 VIEW  Comparison: Chest radiograph performed 02/03/2012, and CT of the chest performed 04/13/2012  Findings: The lungs are well-aerated.  Mild bibasilar airspace opacities raise concern for pneumonia, given the patient's symptoms.  There is no evidence of pleural effusion or pneumothorax.  The patient's left-sided chest port is noted ending about the proximal SVC.  The cardiomediastinal silhouette is borderline normal in size.  No acute osseous abnormalities are seen.  A right humeral head prosthesis is grossly unremarkable in appearance. Mild underlying degenerative change is seen.  IMPRESSION: Mild bibasilar airspace opacities raise concern for pneumonia, given clinical concern.   Original Report Authenticated By: Tonia Ghent, M.D.    I reviewed the above portable chest x-ray myself.  1. Healthcare-associated pneumonia   2. Atrial fibrillation with RVR   3. Neutropenia, febrile     Oxygenation on nasal cannula oxygen is 98% which I interpret to be adequate.  12:35 AM Due to atrial fib with RVR, BP is adequate, diltiazem drip ordered.  Will need more titration as HR has decreased minimally into the 120's.  Spoke to [employer], Dr. Lovell Sheehan who agrees on admission.    EKG performed at time 21:37, shows a 2 fibrillation with rapid ventricular response at a rate of 141 with multiple PVCs. Right bundle branch block is present. Compared to EKG from 01/30/2012, right bundle branch block is new. Rate is significantly increased. Impression is abnormal ECG with serial change.  MDM   Patient  here is febrile and tachycardic with atrial fibrillation with RVR. Patient has a chronic history return for relation. Patient is not hypotensive, normal mentation. Lactic acid is mildly elevated at 2.5 the does not meet overt sepsis criteria. Patient does have a history of leukemia currently is neutropenic and will need IV antibiotics and admission.  Blood cultures and urine cultures have been drawn. Antibiotics have been ordered and plan is to consult with [facility] hospitalist for admission.        Gavin Pound. Oletta Lamas, MD 05/08/12 1610  Gavin Pound. Oletta Lamas, MD 05/08/12 [id number]

## 2012-05-07 NOTE — ED Notes (Signed)
Laboratory staff member Elayne Guerin called for critical value WBC=0.9 K/uL; will call back to verify value

## 2012-05-07 NOTE — ED Notes (Signed)
Pt has been on Levaquin back in November by family doctor/placed on Levaquin in hospital; placed on Levaquin again in January but didn't finish prescribed treatment

## 2012-05-07 NOTE — Progress Notes (Signed)
ANTIBIOTIC CONSULT NOTE - INITIAL  Pharmacy Consult for vancomycin Indication: rule out pneumonia  Allergies  Allergen Reactions  . Statins     Intolerant secondary to severe myalgias    Patient Measurements: Height: 5\' 7"  (170.2 cm) Weight: 198 lb 13.7 oz (90.2 kg) IBW/kg (Calculated) : 66.1  Vital Signs: Temp: 101.9 F (38.8 C) (02/14 2254) Temp src: Oral (02/14 2254) BP: 134/72 mmHg (02/14 2300) Pulse Rate: 127 (02/14 2300)  Labs:  Recent Labs  05/07/12 2222 05/07/12 2230  WBC  --  0.9*  HGB  --  11.4*  PLT  --  210  CREATININE 0.88  --    Estimated Creatinine Clearance: 81.2 ml/min (by C-G formula based on Cr of 0.88).  Microbiology: No results found for this or any previous visit (from the past 720 hour(s)).  Medical History: Past Medical History  Diagnosis Date  . Hypertension   . Atrial fibrillation 10/2008  . Nonischemic cardiomyopathy 02/2009    Presented with CHF; EF of 25-30%; Nonobstructive ASCVD-worst lesion 40% on coronary angiography  . Peptic ulcer disease   . Degenerative joint disease     Knees and shoulders  . Pneumonia 2010  . Low TSH level     Borderline  . Allergic rhinitis   . Anxiety   . Drug abuse   . Tobacco abuse     50 pack years  . 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 Basal cell carcinoma     Assessment: 71yo male c/o CP, has recently been on two courses of Levaquin for CAP though pt did not complete last course, CXR concerning for PNA, to begin IV ABX.  Goal of Therapy:  Vancomycin trough level 15-20 mcg/ml  Plan:  Will begin vancomycin 1250mg  IV Q12H for 8 days and monitor CBC, Cx, levels prn.  Colleen Can PharmD BCPS 05/07/2012,11:56 PM

## 2012-05-07 NOTE — ED Notes (Signed)
Per ems- pt c/o chest pain this evening, generalized pressure. Currently denies pain, but states "I know where my heart is." Pt noted to be in afib on monitor hr-103-137. Frequent pvcs noted. Hx of afib. Pt took 324 asa, given 2 nitro, no change. 20g IV right ac. BP-162/84 O2-91% on RA, hx of pneumonia last week.

## 2012-05-08 ENCOUNTER — Encounter (HOSPITAL_COMMUNITY): Payer: Self-pay

## 2012-05-08 DIAGNOSIS — I4891 Unspecified atrial fibrillation: Secondary | ICD-10-CM | POA: Diagnosis present

## 2012-05-08 DIAGNOSIS — D649 Anemia, unspecified: Secondary | ICD-10-CM

## 2012-05-08 DIAGNOSIS — Z7901 Long term (current) use of anticoagulants: Secondary | ICD-10-CM

## 2012-05-08 DIAGNOSIS — J189 Pneumonia, unspecified organism: Secondary | ICD-10-CM

## 2012-05-08 DIAGNOSIS — R5081 Fever presenting with conditions classified elsewhere: Secondary | ICD-10-CM

## 2012-05-08 DIAGNOSIS — D709 Neutropenia, unspecified: Secondary | ICD-10-CM | POA: Diagnosis present

## 2012-05-08 DIAGNOSIS — I10 Essential (primary) hypertension: Secondary | ICD-10-CM

## 2012-05-08 DIAGNOSIS — F17200 Nicotine dependence, unspecified, uncomplicated: Secondary | ICD-10-CM

## 2012-05-08 LAB — CBC
Hemoglobin: 10.3 g/dL — ABNORMAL LOW (ref 13.0–17.0)
MCH: 27.6 pg (ref 26.0–34.0)
RBC: 3.73 MIL/uL — ABNORMAL LOW (ref 4.22–5.81)

## 2012-05-08 LAB — PROTIME-INR
INR: 2.83 — ABNORMAL HIGH (ref 0.00–1.49)
Prothrombin Time: 28.3 s — ABNORMAL HIGH (ref 11.6–15.2)

## 2012-05-08 LAB — BASIC METABOLIC PANEL
CO2: 22 mEq/L (ref 19–32)
GFR calc non Af Amer: 83 mL/min — ABNORMAL LOW (ref >90)
Glucose, Bld: 103 mg/dL — ABNORMAL HIGH (ref 70–99)
Potassium: 3.8 mEq/L (ref 3.5–5.1)
Sodium: 134 mEq/L — ABNORMAL LOW (ref 135–145)

## 2012-05-08 MED ORDER — WARFARIN - PHARMACIST DOSING INPATIENT
Freq: Every day | Status: DC
Start: 2012-05-08 — End: 2012-05-11

## 2012-05-08 MED ORDER — HYDROCODONE-ACETAMINOPHEN 5-325 MG PO TABS
1.0000 | ORAL_TABLET | ORAL | Status: DC | PRN
  Administered 2012-05-08 – 2012-05-11 (×9): 1 via ORAL
  Filled 2012-05-08 (×9): qty 1

## 2012-05-08 MED ORDER — SODIUM CHLORIDE 0.9 % IV SOLN
INTRAVENOUS | Status: DC
  Administered 2012-05-09: 06:00:00 via INTRAVENOUS

## 2012-05-08 MED ORDER — WARFARIN SODIUM 2.5 MG PO TABS
2.5000 mg | ORAL_TABLET | ORAL | Status: DC
  Administered 2012-05-09: 2.5 mg via ORAL
  Filled 2012-05-08 (×3): qty 1

## 2012-05-08 MED ORDER — HYDROMORPHONE HCL PF 1 MG/ML IJ SOLN: 0.5000 mg | INTRAMUSCULAR | Status: DC | PRN

## 2012-05-08 MED ORDER — LEVOFLOXACIN IN D5W 750 MG/150ML IV SOLN
750.0000 mg | INTRAVENOUS | Status: DC
  Administered 2012-05-08 – 2012-05-10 (×3): 750 mg via INTRAVENOUS
  Filled 2012-05-08 (×5): qty 150

## 2012-05-08 MED ORDER — DEXTROSE 5 % IV SOLN
1.0000 g | Freq: Two times a day (BID) | INTRAVENOUS | Status: AC
  Administered 2012-05-08 – 2012-05-10 (×6): 1 g via INTRAVENOUS
  Filled 2012-05-08 (×7): qty 1

## 2012-05-08 MED ORDER — OXYCODONE HCL 5 MG PO TABS
5.0000 mg | ORAL_TABLET | ORAL | Status: DC | PRN
  Administered 2012-05-08 – 2012-05-10 (×3): 5 mg via ORAL
  Filled 2012-05-08 (×3): qty 1

## 2012-05-08 MED ORDER — WARFARIN SODIUM 5 MG PO TABS: 5.0000 mg | ORAL_TABLET | ORAL | Status: DC

## 2012-05-08 MED ORDER — SODIUM CHLORIDE 0.9 % IV SOLN
Freq: Once | INTRAVENOUS | Status: AC
  Administered 2012-05-08: 01:00:00 via INTRAVENOUS

## 2012-05-08 MED ORDER — ALPRAZOLAM 0.5 MG PO TABS
0.5000 mg | ORAL_TABLET | Freq: Four times a day (QID) | ORAL | Status: DC
  Administered 2012-05-08 – 2012-05-11 (×10): 0.5 mg via ORAL
  Filled 2012-05-08 (×11): qty 1

## 2012-05-08 MED ORDER — PANTOPRAZOLE SODIUM 40 MG PO TBEC
40.0000 mg | DELAYED_RELEASE_TABLET | Freq: Every day | ORAL | Status: DC
  Administered 2012-05-08 – 2012-05-11 (×4): 40 mg via ORAL
  Filled 2012-05-08 (×5): qty 1

## 2012-05-08 MED ORDER — WARFARIN SODIUM 5 MG PO TABS
5.0000 mg | ORAL_TABLET | ORAL | Status: DC
  Administered 2012-05-10: 5 mg via ORAL
  Filled 2012-05-08: qty 1

## 2012-05-08 MED ORDER — SODIUM CHLORIDE 0.9 % IV BOLUS (SEPSIS)
500.0000 mL | Freq: Once | INTRAVENOUS | Status: AC
  Administered 2012-05-08: 22:00:00 via INTRAVENOUS

## 2012-05-08 MED ORDER — HYDROCODONE-ACETAMINOPHEN 5-325 MG PO TABS
1.0000 | ORAL_TABLET | Freq: Once | ORAL | Status: AC
Start: 2012-05-08 — End: 2012-05-08
  Administered 2012-05-08: 1 via ORAL
  Filled 2012-05-08: qty 1

## 2012-05-08 MED ORDER — ONDANSETRON HCL 4 MG PO TABS: 4.0000 mg | ORAL_TABLET | Freq: Four times a day (QID) | ORAL | Status: DC | PRN

## 2012-05-08 MED ORDER — POTASSIUM CHLORIDE CRYS ER 20 MEQ PO TBCR
20.0000 meq | EXTENDED_RELEASE_TABLET | Freq: Every day | ORAL | Status: DC | PRN
  Filled 2012-05-08: qty 1
  Filled 2012-05-08: qty 2

## 2012-05-08 MED ORDER — ZOLPIDEM TARTRATE 5 MG PO TABS
5.0000 mg | ORAL_TABLET | Freq: Every evening | ORAL | Status: DC | PRN
  Administered 2012-05-09: 5 mg via ORAL
  Filled 2012-05-08: qty 1

## 2012-05-08 MED ORDER — ACETAMINOPHEN 650 MG RE SUPP: 650.0000 mg | Freq: Four times a day (QID) | RECTAL | Status: DC | PRN

## 2012-05-08 MED ORDER — ACETAMINOPHEN 325 MG PO TABS
650.0000 mg | ORAL_TABLET | Freq: Four times a day (QID) | ORAL | Status: DC | PRN
  Administered 2012-05-08: 325 mg via ORAL
  Filled 2012-05-08: qty 1

## 2012-05-08 MED ORDER — DILTIAZEM HCL 100 MG IV SOLR
5.0000 mg/h | INTRAVENOUS | Status: DC
  Filled 2012-05-08: qty 100

## 2012-05-08 MED ORDER — CARVEDILOL 25 MG PO TABS
25.0000 mg | ORAL_TABLET | Freq: Two times a day (BID) | ORAL | Status: DC
  Administered 2012-05-08: 25 mg via ORAL
  Filled 2012-05-08 (×3): qty 1

## 2012-05-08 MED ORDER — ALUM & MAG HYDROXIDE-SIMETH 200-200-20 MG/5ML PO SUSP: 30.0000 mL | Freq: Four times a day (QID) | ORAL | Status: DC | PRN

## 2012-05-08 MED ORDER — ALBUTEROL SULFATE (5 MG/ML) 0.5% IN NEBU
2.5000 mg | INHALATION_SOLUTION | Freq: Four times a day (QID) | RESPIRATORY_TRACT | Status: DC
  Administered 2012-05-08 – 2012-05-10 (×10): 2.5 mg via RESPIRATORY_TRACT
  Filled 2012-05-08 (×10): qty 0.5

## 2012-05-08 MED ORDER — DILTIAZEM HCL ER COATED BEADS 300 MG PO CP24
300.0000 mg | ORAL_CAPSULE | Freq: Every day | ORAL | Status: DC
  Administered 2012-05-08 – 2012-05-11 (×4): 300 mg via ORAL
  Filled 2012-05-08 (×5): qty 1

## 2012-05-08 MED ORDER — ALBUTEROL SULFATE (5 MG/ML) 0.5% IN NEBU: 2.5000 mg | INHALATION_SOLUTION | RESPIRATORY_TRACT | Status: DC | PRN

## 2012-05-08 MED ORDER — TRAMADOL HCL 50 MG PO TABS
50.0000 mg | ORAL_TABLET | Freq: Four times a day (QID) | ORAL | Status: DC | PRN
  Administered 2012-05-08 – 2012-05-09 (×2): 50 mg via ORAL
  Administered 2012-05-10 – 2012-05-11 (×5): 100 mg via ORAL
  Filled 2012-05-08 (×7): qty 2

## 2012-05-08 MED ORDER — SODIUM CHLORIDE 0.9 % IV BOLUS (SEPSIS): 500.0000 mL | Freq: Once | INTRAVENOUS | Status: DC

## 2012-05-08 MED ORDER — ONDANSETRON HCL 4 MG/2ML IJ SOLN: 4.0000 mg | Freq: Four times a day (QID) | INTRAMUSCULAR | Status: DC | PRN

## 2012-05-08 MED ORDER — WARFARIN SODIUM 2.5 MG PO TABS
2.5000 mg | ORAL_TABLET | ORAL | Status: DC
  Administered 2012-05-08: 2.5 mg via ORAL
  Filled 2012-05-08 (×2): qty 1

## 2012-05-08 NOTE — Progress Notes (Signed)
UR completed 

## 2012-05-08 NOTE — Progress Notes (Signed)
[facility] Progress Note [place] TEAM 1 - Stepdown/ICU TEAM   Charles [name] WUJ:[id number] DOB: 04/19/1940 DOA: 05/07/2012 PCP: [name], MD  Brief narrative: Charles [name] is a 72 y.o. male with a history of follicular Lymphoma diagnosed in 06/2010 whose last chemotherapy was in 10/2011-  He presents with complaints of sudden chills and shakes in the afternoon, along with worsening cough. His cough has been productive of yellow sputum. He also reports having fatigue and weakness and decreased appetite. He was found to have a fever in the ED to 101.9. He has had recurring bouts of pneumonia since 01/2012. His oncologist placed him on a series of injections to boost his immunity and he reports completing the therapy 1 week ago. In the ED, he was evaluated and found to have bibasilar pneumonia and was placed on antibiotic therapy to cover HCAP with IV Vancomycin, Cefepime, and Levaquin. His neutrophil count was found to be 0.9 and 0.4%.   Assessment/Plan: Principal Problem:   CAP in in Neutropenic patient Continue vancomycin, cefepime and Levaquin Obtain influenza swab  Active Problems:  Hypotension Blood pressure noted to drop acutely- have called the nurse and have had them take repeat blood pressures which continue to be in the low 80s and high 70s systolic this is likely due to by mouth Cardizem that was given this morning-patient is awake and alert and sitting up in bed-have asked RN to give a 500 cc bolus  Atrial fibrillation Rate controlled with by mouth Cardizem on Coumadin-INR therapeutic  Neutropenia If no improvement by tomorrow, will discuss with hematology Apparently received a series of 7 injections, one daily, at oncology office last week    Follicular lymphoma Last chemotherapy on 8/13   Code Status: Full code  Family Communication: None  Disposition Plan: Follow in step  down  Consultants: None  Procedures: None  Antibiotics: Vancomycin 2/14 Zosyn 2/14  DVT prophylaxis: Therapeutic on Coumadin  HPI/Subjective: Patient alert, sitting up in bed. Complaints of fevers mild cough and shortness of breath. No complaints of dysuria, no new rash.  He is unable to tell me the names of the injections that he received at the oncology office.    Objective: Blood pressure 80/40, pulse 90, temperature 98.9 F (37.2 C), temperature source Oral, resp. rate 16, height 5\' 7"  (1.702 m), weight 88.1 kg (194 lb 3.6 oz), SpO2 97.00%.  Intake/Output Summary (Last 24 hours) at 05/08/12 1753 Last data filed at 05/08/12 1700  Gross per 24 hour  Intake 3067.33 ml  Output   1150 ml  Net 1917.33 ml     Exam: General: No acute respiratory distress Lungs: Clear to auscultation bilaterally without wheezes or crackles Cardiovascular: Regular rate and rhythm without murmur gallop or rub normal S1 and S2 Abdomen: Nontender, nondistended, soft, bowel sounds positive, no rebound, no ascites, no appreciable mass Extremities: No significant cyanosis, clubbing, or edema bilateral lower extremities  Data Reviewed: Basic Metabolic Panel:  Recent Labs Lab 05/07/12 2222 05/08/12 0640  NA 138 134*  K 4.2 3.8  CL 101 101  CO2 26 22  GLUCOSE 127* 103*  BUN 13 13  CREATININE 0.88 0.90  CALCIUM 9.2 8.8   Liver Function Tests: No results found for this basename: AST, ALT, ALKPHOS, BILITOT, PROT, ALBUMIN,  in the last 168 hours No results found for this basename: LIPASE, AMYLASE,  in the last 168 hours No results found for this basename: AMMONIA,  in the last 168 hours CBC:  Recent  Labs Lab 05/07/12 2230 05/08/12 0640  WBC 0.9* 1.5*  NEUTROABS 0.4*  --   HGB 11.4* 10.3*  HCT 35.1* 31.4*  MCV 86.0 84.2  PLT 210 190   Cardiac Enzymes: No results found for this basename: CKTOTAL, CKMB, CKMBINDEX, TROPONINI,  in the last 168 hours BNP (last 3 results) No results  found for this basename: PROBNP,  in the last 8760 hours CBG: No results found for this basename: GLUCAP,  in the last 168 hours  Recent Results (from the past 240 hour(s))  MRSA PCR SCREENING     Status: None   Collection Time    05/08/12  3:01 AM      Result Value Range Status   MRSA by PCR NEGATIVE  NEGATIVE Final   Comment:            The GeneXpert MRSA Assay (FDA     approved for NASAL specimens     only), is one component of a     comprehensive MRSA colonization     surveillance program. It is not     intended to diagnose MRSA     infection nor to guide or     monitor treatment for     MRSA infections.     Studies:  Recent x-ray studies have been reviewed in detail by the Attending Physician  Scheduled Meds:  Reviewed in detail by the Attending Physician   [facility]  If 7PM-7AM, please contact night-coverage [email or web address] Password [facility] 05/08/2012, 5:53 PM   LOS: 1 day

## 2012-05-08 NOTE — Progress Notes (Signed)
Spoke with Benedetto Coons about HR 110-120.  Will address fever before we change meds.  New order for Vicodin received due to family and patient request for specific med.

## 2012-05-08 NOTE — Progress Notes (Signed)
Spoke with Benedetto Coons about admission orders. Asked to re-page Dr Lovell Sheehan for orders.

## 2012-05-08 NOTE — H&P (Signed)
[facility] History and Physical  Charles [name] WUJ:[id number] DOB: [birth year] DOA: 05/07/2012  Referring physician:  PCP: [name], MD  Specialists:   Chief Complaint: Chills and Worsening Cough  HPI: Charles [name] is a 72 y.o. male with a history of follicular Lymphoma diagnosed in 06/2010 whose last chemotherapy was in 10/2011 who presents with complaints of sudden chills and shakes in the afternoon, along with worsening cough.  His cough has been productive of yellow sputum.   He also reports having fatigue and weakness and decreased appetite.  He was found to have a fever in the ED to 101.9.  He has had recurring bouts of pneumonia since 01/2012.  His oncologist placed him on a series of injections to boost his immunity and he reports completing the therapy 1 week ago.   In the ED, he was evaluated and found to have bibasilar pneumonia and was placed on antibiotic therapy to cover HCAP with IV Vancomycin, Cefepime, and Levaquin.  His neutrophil count was found to be 0.9 and 0.4%.     Review of Systems: The patient denies anorexia, weight loss, vision loss, decreased hearing, hoarseness, chest pain, syncope, dyspnea on exertion, peripheral edema, balance deficits, hemoptysis, abdominal pain,  Nausea, vomiting, diarrhea, constipation, hematemesis, melena, hematochezia, severe indigestion/heartburn, hematuria, incontinence, suspicious skin lesions, transient blindness, difficulty walking, depression, unusual weight change, abnormal bleeding, enlarged lymph nodes, angioedema, and breast masses.    Past Medical History  Diagnosis Date  . Hypertension   . Atrial fibrillation 10/2008  . Nonischemic cardiomyopathy 02/2009    Presented with CHF; EF of 25-30%; Nonobstructive ASCVD-worst lesion 40% on coronary angiography  . Peptic ulcer disease   . Degenerative joint disease     Knees and shoulders  . Pneumonia 2010  . Low TSH level     Borderline  . Allergic rhinitis   .  Anxiety   . Drug abuse   . Tobacco abuse     50 pack years  . 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 Basal cell carcinoma    Past Surgical History  Procedure Laterality Date  . Neck lesion biopsy  June 24, 2010    Follicular lymphoma  . Skin cancer excision      under left breast; variously described as melanoma and basal cell  . Rotator cuff repair      right  . Patella fracture surgery  1975  . Hematoma evacuation      after melanoma removal  . Portacath placement  07/12/2010  . Colonoscopy  2009    Also underwent an EGD; patient reports no significant findings     Medications:  HOME MEDS: Prior to Admission medications   Medication Sig Start Date End Date Taking? Authorizing Provider  ALPRAZolam Prudy Feeler) 1 MG tablet Take 0.5 mg by mouth 4 (four) times daily.    Yes Historical Provider, MD  carvedilol (COREG) 25 MG tablet Take 25 mg by mouth 2 (two) times daily. 1/2 tablet if BP is below 10/70  03/12/12  Yes Kathlen Brunswick, MD  diltiazem (CARDIZEM CD) 300 MG 24 hr capsule Take 1 capsule (300 mg total) by mouth daily. 04/19/12  Yes Kathlen Brunswick, MD  doxycycline (DORYX) 100 MG EC tablet Take 100 mg by mouth 2 (two) times daily.   Yes Historical Provider, MD  furosemide (LASIX) 40 MG tablet Take 20-40 mg by mouth daily as needed (for fluid retention). Pt can take a half to 1 tab as needed for fluid retention  Yes Historical Provider, MD  HYDROcodone-acetaminophen (NORCO/VICODIN) 5-325 MG per tablet Take 1 tablet by mouth every 6 (six) hours as needed for pain.  02/13/12  Yes Ellouise Newer, PA  lovastatin (MEVACOR) 10 MG tablet Take 10 mg by mouth daily.    Yes Historical Provider, MD  Multiple Vitamins-Iron (ONE-TABLET-DAILY/IRON PO) Take 1 tablet by mouth daily.   Yes Historical Provider, MD  omeprazole (PRILOSEC) 20 MG capsule Take 20 mg by mouth daily.   Yes Historical Provider, MD  potassium chloride SA (K-DUR,KLOR-CON) 20 MEQ tablet Take 20 mEq by mouth daily as needed  (takes along with lasix).  02/13/12  Yes Thomas S Kefalas, PA  PROAIR HFA 108 (90 BASE) MCG/ACT inhaler Inhale 1 puff into the lungs every 6 (six) hours as needed. For shortness of breath 01/15/12  Yes Historical Provider, MD  traMADol (ULTRAM) 50 MG tablet Take 50-100 mg by mouth 4 (four) times daily as needed. For pain   Yes Historical Provider, MD  warfarin (COUMADIN) 5 MG tablet Take 2.5-5 mg by mouth daily. Pt takes 2.5mg  on  sun, tue, thu , sat and 5 mg all other days 05/20/11  Yes Jodelle Gross, NP    Allergies:  Allergies  Allergen Reactions  . Statins     Intolerant secondary to severe myalgias    Social History:   reports that he quit smoking about 3 weeks ago. His smoking use included Cigarettes. He has a 50 pack-year smoking history. He does not have any smokeless tobacco history on file. He reports that he does not drink alcohol or use illicit drugs.  Family History: Family History  Problem Relation Age of Onset  . Heart attack Father   . Coronary artery disease Other   Physical Exam:  GEN:  Pleasant examined  and in no acute distress; cooperative with exam Filed Vitals:   05/08/12 0000 05/08/12 0015 05/08/12 0030 05/08/12 0105  BP: 130/68 131/68 116/67 126/81  Pulse: 71 68 130 135  Temp:      TempSrc:      Resp: 23 25 24 16   Height:      Weight:      SpO2: 99% 99% 98% 98%   Blood pressure 126/81, pulse 135, temperature 101.9 F (38.8 C), temperature source Oral, resp. rate 16, height 5\' 7"  (1.702 m), weight 90.2 kg (198 lb 13.7 oz), SpO2 98.00%. PSYCH: He is alert and oriented x4; does not appear anxious does not appear depressed; affect is normal HEENT: Normocephalic and Atraumatic, Mucous membranes pink; PERRLA; EOM intact; Fundi:  Benign;  No scleral icterus, Nares: Patent, Oropharynx: Clear, Edentulous or Fair Dentition, Neck:  FROM, no cervical lymphadenopathy nor thyromegaly or carotid bruit; no JVD; Breasts:: Not examined CHEST WALL: No  tenderness CHEST: Normal respiration, clear to auscultation bilaterally HEART: Regular rate and rhythm; no murmurs rubs or gallops BACK: No kyphosis or scoliosis; no CVA tenderness ABDOMEN: Positive Bowel Sounds, Scaphoid, Obese, soft non-tender; no masses, no organomegaly, no pannus; no intertriginous candida. Rectal Exam: Not done EXTREMITIES: No bone or joint deformity; age-appropriate arthropathy of the hands and knees; no cyanosis, clubbing or edema; no ulcerations. Genitalia: not examined PULSES: 2+ and symmetric SKIN: Normal hydration no rash or ulceration CNS: Cranial nerves 2-12 grossly intact no focal neurologic deficit   Labs on Admission:  Basic Metabolic Panel:  Recent Labs Lab 05/07/12 2222  NA 138  K 4.2  CL 101  CO2 26  GLUCOSE 127*  BUN 13  CREATININE 0.88  CALCIUM 9.2   Liver Function Tests: No results found for this basename: AST, ALT, ALKPHOS, BILITOT, PROT, ALBUMIN,  in the last 168 hours No results found for this basename: LIPASE, AMYLASE,  in the last 168 hours No results found for this basename: AMMONIA,  in the last 168 hours CBC:  Recent Labs Lab 05/07/12 2230  WBC 0.9*  NEUTROABS 0.4*  HGB 11.4*  HCT 35.1*  MCV 86.0  PLT 210   Cardiac Enzymes: No results found for this basename: CKTOTAL, CKMB, CKMBINDEX, TROPONINI,  in the last 168 hours  BNP (last 3 results) No results found for this basename: PROBNP,  in the last 8760 hours CBG: No results found for this basename: GLUCAP,  in the last 168 hours  Radiological Exams on Admission: Dg Chest Port 1 View  05/07/2012  *RADIOLOGY REPORT*  Clinical Data: Fever and cough.  History of smoking.  PORTABLE CHEST - 1 VIEW  Comparison: Chest radiograph performed 02/03/2012, and CT of the chest performed 04/13/2012  Findings: The lungs are well-aerated.  Mild bibasilar airspace opacities raise concern for pneumonia, given the patient's symptoms.  There is no evidence of pleural effusion or  pneumothorax.  The patient's left-sided chest port is noted ending about the proximal SVC.  The cardiomediastinal silhouette is borderline normal in size.  No acute osseous abnormalities are seen.  A right humeral head prosthesis is grossly unremarkable in appearance. Mild underlying degenerative change is seen.  IMPRESSION: Mild bibasilar airspace opacities raise concern for pneumonia, given clinical concern.   Original Report Authenticated By: Tonia Ghent, M.D.     EKG:  Atrial fibrillation with RVR at 141.    Assessment/Plan Principal Problem:   HCAP (healthcare-associated pneumonia) Active Problems:   Atrial fibrillation with rapid ventricular response   Neutropenia   TOBACCO ABUSE   HYPERTENSION   Chronic anticoagulation   Follicular lymphoma   Anemia, normocytic normochromic   1.   HCAP-placed on IV Vancomycin, Cefepime,and Levaquin.  Nebs, O2 PRN.  Placed in Isolation due to Pneumonia and his Neutropenia.    2.   Atrial Fibrillation with RVR-  IV diltiazem drip to control rate,  Gentle Hydration due to fever and pneumonia.     3.   Neutropenia-  In Isolation, on antibiotics, monitor WBC count.    4.   HTN-   Stable on meds.    5.   Chronic Anticoagulation-   Continue coumadin, adjust PRN.  PT/INR q day.    6.    Anemia- Stable at 11.1.  Monitor.     7.    Follicular Lymphoma-  His Oncologist is Dr Laurie Panda in [place], [place].    8.  Tobacco Abuse-  Nicotine patch while inpatient.    9.  Other- Home medications reconciled.       Code Status:     FULL CODE Family Communication:    Daughter at Bedside Disposition Plan:    Return to Home on discharge     Time spent:  26 Minutes  Ron Parker [facility] Pager [phone]  If 7PM-7AM, please contact night-coverage [email or web address] Password [facility] 05/08/2012, 1:39 AM

## 2012-05-08 NOTE — Progress Notes (Signed)
ANTICOAGULATION CONSULT NOTE - Initial Consult  Pharmacy Consult for Coumadin Indication: atrial fibrillation  Allergies  Allergen Reactions  . Statins     Intolerant secondary to severe myalgias    Patient Measurements: Height: 5\' 7"  (170.2 cm) Weight: 198 lb 13.7 oz (90.2 kg) IBW/kg (Calculated) : 66.1  Vital Signs: Temp: 101.9 F (38.8 C) (02/14 2254) Temp src: Oral (02/14 2254) BP: 126/81 mmHg (02/15 0105) Pulse Rate: 135 (02/15 0105)  Labs:  Recent Labs  05/07/12 2222 05/07/12 2230 05/08/12 0001  HGB  --  11.4*  --   HCT  --  35.1*  --   PLT  --  210  --   LABPROT  --   --  28.3*  INR  --   --  2.83*  CREATININE 0.88  --   --     Estimated Creatinine Clearance: 81.2 ml/min (by C-G formula based on Cr of 0.88).   Medical History: Past Medical History  Diagnosis Date  . Hypertension   . Atrial fibrillation 10/2008  . Nonischemic cardiomyopathy 02/2009    Presented with CHF; EF of 25-30%; Nonobstructive ASCVD-worst lesion 40% on coronary angiography  . Peptic ulcer disease   . Degenerative joint disease     Knees and shoulders  . Pneumonia 2010  . Low TSH level     Borderline  . Allergic rhinitis   . Anxiety   . Drug abuse   . Tobacco abuse     50 pack years  . 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 Basal cell carcinoma      Assessment: 72yo male to continue Coumadin for Afib during admission for possible PNA; admitted with therapeutic INR.  Goal of Therapy:  INR 2-3   Plan:  Will continue home Coumadin dose of 2.5mg  TTSS and 5mg  MWF and monitor INR for dose adjustments.  Colleen Can PharmD BCPS 05/08/2012,1:42 AM

## 2012-05-09 LAB — URINE CULTURE
Colony Count: NO GROWTH
Culture: NO GROWTH

## 2012-05-09 LAB — BASIC METABOLIC PANEL
GFR calc Af Amer: >90 mL/min (ref >90)
GFR calc non Af Amer: 85 mL/min — ABNORMAL LOW (ref >90)
Potassium: 4.3 mEq/L (ref 3.5–5.1)
Sodium: 135 mEq/L (ref 135–145)

## 2012-05-09 LAB — PROTIME-INR
INR: 2.59 — ABNORMAL HIGH (ref 0.00–1.49)
Prothrombin Time: 26.5 seconds — ABNORMAL HIGH (ref 11.6–15.2)

## 2012-05-09 LAB — CBC
MCHC: 33.2 g/dL (ref 30.0–36.0)
Platelets: 170 10*3/uL (ref 150–400)
RDW: 18.1 % — ABNORMAL HIGH (ref 11.5–15.5)

## 2012-05-09 LAB — INFLUENZA PANEL BY PCR (TYPE A & B)
Influenza A By PCR: NEGATIVE
Influenza B By PCR: NEGATIVE

## 2012-05-09 MED ORDER — ALTEPLASE 2 MG IJ SOLR
2.0000 mg | Freq: Once | INTRAMUSCULAR | Status: AC
  Administered 2012-05-09: 2 mg
  Filled 2012-05-09: qty 2

## 2012-05-09 MED ORDER — SODIUM CHLORIDE 0.9 % IJ SOLN
10.0000 mL | INTRAMUSCULAR | Status: DC | PRN
  Administered 2012-05-11: 10 mL

## 2012-05-09 NOTE — Progress Notes (Signed)
ANTICOAGULATION CONSULT NOTE - Follow Up Consult  Pharmacy Consult for Coumadin Indication: atrial fibrillation  Allergies  Allergen Reactions  . Statins     Intolerant secondary to severe myalgias    Patient Measurements: Height: 5\' 7"  (170.2 cm) Weight: 194 lb 3.6 oz (88.1 kg) IBW/kg (Calculated) : 66.1  Vital Signs: Temp: 99 F (37.2 C) (02/16 0743) Temp src: Oral (02/16 0743) BP: 107/61 mmHg (02/16 0600) Pulse Rate: 99 (02/16 0600)  Labs:  Recent Labs  05/07/12 2222  05/07/12 2230 05/08/12 0001 05/08/12 0640 05/09/12 0600  HGB  --   < > 11.4*  --  10.3* 8.9*  HCT  --   --  35.1*  --  31.4* 26.8*  PLT  --   --  210  --  190 170  LABPROT  --   --   --  28.3* 27.3* 26.5*  INR  --   --   --  2.83* 2.69* 2.59*  CREATININE 0.88  --   --   --  0.90 0.85  < > = values in this interval not displayed.  Estimated Creatinine Clearance: 83.2 ml/min (by C-G formula based on Cr of 0.85).   Medications:  Scheduled:  . albuterol  2.5 mg Nebulization Q6H  . ALPRAZolam  0.5 mg Oral QID  . ceFEPime (MAXIPIME) IV  1 g Intravenous Q12H  . diltiazem  300 mg Oral Daily  . levofloxacin (LEVAQUIN) IV  750 mg Intravenous Q24H  . pantoprazole  40 mg Oral Daily  . sodium chloride  500 mL Intravenous Once  . [COMPLETED] sodium chloride  500 mL Intravenous Once  . vancomycin  1,250 mg Intravenous Q12H  . warfarin  2.5 mg Oral Q T,Th,S,Su-1800  . [START ON 05/10/2012] warfarin  5 mg Oral Q M,W,F-1800  . Warfarin - Pharmacist Dosing Inpatient   Does not apply q1800  . [DISCONTINUED] carvedilol  25 mg Oral BID  . [DISCONTINUED] warfarin  2.5 mg Oral Custom  . [DISCONTINUED] warfarin  5 mg Oral Custom    Assessment: 71yo male to continue Coumadin for Afib during admission for possible PNA; admitted with therapeutic INR. INR today remains therapeutic at 2.59.  Goal of Therapy:  INR 2-3 Monitor platelets by anticoagulation protocol: Yes   Plan:  Will continue home Coumadin dose of  2.5mg  TTSS and 5mg  MWF and monitor INR for dose adjustments.  Lillia Pauls, PharmD Clinical Pharmacist Pager: [phone] Phone: [phone] 05/09/2012 9:25 AM

## 2012-05-09 NOTE — Progress Notes (Addendum)
[facility] Progress Note [place] TEAM 1 - Stepdown/ICU TEAM   [name]:[id number] DOB: [birth year] DOA: 05/07/2012 PCP: [name], MD  Brief narrative: Charles [name] is a 72 y.o. male with a history of follicular Lymphoma diagnosed in 06/2010 whose last chemotherapy was in 10/2011-  He presents with complaints of sudden chills and shakes in the afternoon, along with worsening cough. His cough has been productive of yellow sputum. He also reports having fatigue and weakness and decreased appetite. He was found to have a fever in the ED to 101.9. He has had recurring bouts of pneumonia since 01/2012. His oncologist placed him on a series of injections to boost his immunity and he reports completing the therapy 1 week ago. In the ED, he was evaluated and found to have bibasilar pneumonia and was placed on antibiotic therapy to cover HCAP with IV Vancomycin, Cefepime, and Levaquin. His neutrophil count was found to be 0.9 and 0.4%.   Assessment/Plan: Principal Problem:   CAP in in Neutropenic patient -Improving on current antibiotic -Continue vancomycin, cefepime and Levaquin  Active Problems:  Hypotension -Blood pressure noted to drop acutely yesterday late afternoon suspected to be secondary to by mouth Cardizem and Coreg that was given in the morning -Fluid boluses did not improve blood pressure however even with these low pressures, patient remained awake and alert even when sitting up.  Atrial fibrillation -on Coumadin-INR therapeutic -We did hold his Coreg yesterday due to hypotension with mean blood pressure in the low 50s -has BP continues to be in the low 100s I will continue to hold his Coreg -Heart rate remains controlled in the 80s on current dose of Cardizem  Neutropenia -Improvement is being noted despite the fact that his blood is quite diluted to the boluses giving yesterday -Apparently received a series of 7 injections, one daily, at  oncology office last week  Anemia Baseline hemoglobin appears to be 10-11 however counts are down to 8.9 today due to IV fluids    Follicular lymphoma -Last chemotherapy on 8/13   Code Status: Full code  Family Communication: None  Disposition Plan: Follow in step down due to periodically uncontrolled A. fib   Consultants: None  Procedures: None  Antibiotics: Vancomycin 2/14 Zosyn 2/14  DVT prophylaxis: Therapeutic on Coumadin  HPI/Subjective: Patient alert- States he is doing much better today. Noted to be washing up at the bedside without any dyspnea.    Objective: Blood pressure 117/63, pulse 114, temperature 97.9 F (36.6 C), temperature source Oral, resp. rate 24, height 5\' 7"  (1.702 m), weight 88.1 kg (194 lb 3.6 oz), SpO2 96.00%.  Intake/Output Summary (Last 24 hours) at 05/09/12 1435 Last data filed at 05/09/12 1100  Gross per 24 hour  Intake   2470 ml  Output   1900 ml  Net    570 ml     Exam: General: No acute respiratory distress Lungs: Breath sounds at bilateral bases Cardiovascular: Regular rate and rhythm without murmur gallop or rub normal S1 and S2 Abdomen: Nontender, nondistended, soft, bowel sounds positive, no rebound, no ascites, no appreciable mass Extremities: No significant cyanosis, clubbing, or edema bilateral lower extremities  Data Reviewed: Basic Metabolic Panel:  Recent Labs Lab 05/07/12 2222 05/08/12 0640 05/09/12 0600  NA 138 134* 135  K 4.2 3.8 4.3  CL 101 101 106  CO2 26 22 18*  GLUCOSE 127* 103* 86  BUN 13 13 15   CREATININE 0.88 0.90 0.85  CALCIUM 9.2 8.8 8.7  Liver Function Tests: No results found for this basename: AST, ALT, ALKPHOS, BILITOT, PROT, ALBUMIN,  in the last 168 hours No results found for this basename: LIPASE, AMYLASE,  in the last 168 hours No results found for this basename: AMMONIA,  in the last 168 hours CBC:  Recent Labs Lab 05/07/12 2230 05/08/12 0640 05/09/12 0600  WBC 0.9* 1.5* 1.6*   NEUTROABS 0.4*  --   --   HGB 11.4* 10.3* 8.9*  HCT 35.1* 31.4* 26.8*  MCV 86.0 84.2 85.4  PLT 210 190 170   Cardiac Enzymes: No results found for this basename: CKTOTAL, CKMB, CKMBINDEX, TROPONINI,  in the last 168 hours BNP (last 3 results) No results found for this basename: PROBNP,  in the last 8760 hours CBG: No results found for this basename: GLUCAP,  in the last 168 hours  Recent Results (from the past 240 hour(s))  URINE CULTURE     Status: None   Collection Time    05/07/12 10:06 PM      Result Value Range Status   Specimen Description URINE, CLEAN CATCH   Final   Special Requests Immunocompromised   Final   Culture  Setup Time 05/08/2012 05:38   Final   Colony Count NO GROWTH   Final   Culture NO GROWTH   Final   Report Status 05/09/2012 FINAL   Final  CULTURE, BLOOD (ROUTINE X 2)     Status: None   Collection Time    05/07/12 10:15 PM      Result Value Range Status   Specimen Description BLOOD LEFT ARM   Final   Special Requests BOTTLES DRAWN AEROBIC AND ANAEROBIC 10CC EA   Final   Culture  Setup Time 05/08/2012 04:47   Final   Culture     Final   Value:        BLOOD CULTURE RECEIVED NO GROWTH TO DATE CULTURE WILL BE HELD FOR 5 DAYS BEFORE ISSUING A FINAL NEGATIVE REPORT   Report Status PENDING   Incomplete  CULTURE, BLOOD (ROUTINE X 2)     Status: None   Collection Time    05/07/12 10:30 PM      Result Value Range Status   Specimen Description BLOOD LEFT HAND   Final   Special Requests BOTTLES DRAWN AEROBIC ONLY 10CC   Final   Culture  Setup Time 05/08/2012 04:47   Final   Culture     Final   Value:        BLOOD CULTURE RECEIVED NO GROWTH TO DATE CULTURE WILL BE HELD FOR 5 DAYS BEFORE ISSUING A FINAL NEGATIVE REPORT   Report Status PENDING   Incomplete  MRSA PCR SCREENING     Status: None   Collection Time    05/08/12  3:01 AM      Result Value Range Status   MRSA by PCR NEGATIVE  NEGATIVE Final   Comment:            The GeneXpert MRSA Assay (FDA      approved for NASAL specimens     only), is one component of a     comprehensive MRSA colonization     surveillance program. It is not     intended to diagnose MRSA     infection nor to guide or     monitor treatment for     MRSA infections.     Studies:  Recent x-ray studies have been reviewed in detail by the Attending Physician  Scheduled Meds:  Reviewed in detail by the Attending Physician   [facility]  If 7PM-7AM, please contact night-coverage [email or web address] Password [email or web address] 05/09/2012, 2:35 PM   LOS: 2 days

## 2012-05-10 ENCOUNTER — Encounter (HOSPITAL_COMMUNITY): Payer: Self-pay | Admitting: Oncology

## 2012-05-10 ENCOUNTER — Other Ambulatory Visit (HOSPITAL_COMMUNITY): Payer: Self-pay | Admitting: Oncology

## 2012-05-10 DIAGNOSIS — I4891 Unspecified atrial fibrillation: Secondary | ICD-10-CM

## 2012-05-10 DIAGNOSIS — J189 Pneumonia, unspecified organism: Principal | ICD-10-CM

## 2012-05-10 DIAGNOSIS — D709 Neutropenia, unspecified: Secondary | ICD-10-CM

## 2012-05-10 DIAGNOSIS — C8299 Follicular lymphoma, unspecified, extranodal and solid organ sites: Secondary | ICD-10-CM

## 2012-05-10 LAB — BASIC METABOLIC PANEL
Chloride: 108 mEq/L (ref 96–112)
Creatinine, Ser: 0.7 mg/dL (ref 0.50–1.35)
GFR calc Af Amer: >90 mL/min (ref >90)

## 2012-05-10 LAB — PROTIME-INR: INR: 2.08 — ABNORMAL HIGH (ref 0.00–1.49)

## 2012-05-10 LAB — CBC WITH DIFFERENTIAL/PLATELET
Basophils Relative: 4 % — ABNORMAL HIGH (ref 0–1)
Eosinophils Relative: 17 % — ABNORMAL HIGH (ref 0–5)
HCT: 30.6 % — ABNORMAL LOW (ref 39.0–52.0)
Lymphs Abs: 0.5 10*3/uL — ABNORMAL LOW (ref 0.7–4.0)
MCV: 85.2 fL (ref 78.0–100.0)
Monocytes Relative: 21 % — ABNORMAL HIGH (ref 3–12)
Neutro Abs: 0.5 10*3/uL — ABNORMAL LOW (ref 1.7–7.7)
RBC: 3.59 MIL/uL — ABNORMAL LOW (ref 4.22–5.81)
WBC: 1.8 10*3/uL — ABNORMAL LOW (ref 4.0–10.5)

## 2012-05-10 LAB — CBC
MCV: 84.1 fL (ref 78.0–100.0)
Platelets: 192 10*3/uL (ref 150–400)
RDW: 17.6 % — ABNORMAL HIGH (ref 11.5–15.5)
WBC: 1.5 10*3/uL — ABNORMAL LOW (ref 4.0–10.5)

## 2012-05-10 LAB — PATHOLOGIST SMEAR REVIEW

## 2012-05-10 MED ORDER — POTASSIUM CHLORIDE CRYS ER 20 MEQ PO TBCR
40.0000 meq | EXTENDED_RELEASE_TABLET | Freq: Every day | ORAL | Status: DC
  Administered 2012-05-10 – 2012-05-11 (×2): 40 meq via ORAL
  Filled 2012-05-10 (×2): qty 2

## 2012-05-10 MED ORDER — FILGRASTIM 480 MCG/1.6ML IJ SOLN
480.0000 ug | Freq: Every day | INTRAMUSCULAR | Status: DC
  Administered 2012-05-10 – 2012-05-11 (×2): 480 ug via SUBCUTANEOUS
  Filled 2012-05-10 (×2): qty 1.6

## 2012-05-10 NOTE — Progress Notes (Signed)
ANTICOAGULATION  CONSULT NOTE - Follow Up Consult  Pharmacy Consult for Coumadin Indication: atrial fibrillation  Allergies  Allergen Reactions  . Statins     Intolerant secondary to severe myalgias   Patient Measurements: Height: 5\' 7"  (170.2 cm) Weight: 194 lb 3.6 oz (88.1 kg) IBW/kg (Calculated) : 66.1 Vital Signs: Temp: 97.3 F (36.3 C) (02/17 1350) Temp src: Oral (02/17 1350) BP: 139/77 mmHg (02/17 1350) Pulse Rate: 106 (02/17 1350) Labs:  Recent Labs  05/08/12 0640 05/09/12 0600 05/10/12 0604 05/10/12 0919  HGB 10.3* 8.9* 9.7* 10.0*  HCT 31.4* 26.8* 29.2* 30.6*  PLT 190 170 192 187  LABPROT 27.3* 26.5* 22.5*  --   INR 2.69* 2.59* 2.08*  --   CREATININE 0.90 0.85 0.70  --    Estimated Creatinine Clearance: 88.4 ml/min (by C-G formula based on Cr of 0.7).  Medications:  Scheduled:  . ALPRAZolam  0.5 mg Oral QID  . [COMPLETED] alteplase  2 mg Intracatheter Once  . [COMPLETED] ceFEPime (MAXIPIME) IV  1 g Intravenous Q12H  . diltiazem  300 mg Oral Daily  . filgrastim (NEUPOGEN)  SQ  480 mcg Subcutaneous Q1200  . levofloxacin (LEVAQUIN) IV  750 mg Intravenous Q24H  . pantoprazole  40 mg Oral Daily  . potassium chloride  40 mEq Oral Daily  . sodium chloride  500 mL Intravenous Once  . vancomycin  1,250 mg Intravenous Q12H  . warfarin  2.5 mg Oral Q T,Th,S,Su-1800  . warfarin  5 mg Oral Q M,W,F-1800  . Warfarin - Pharmacist Dosing Inpatient   Does not apply q1800  . [DISCONTINUED] albuterol  2.5 mg Nebulization Q6H    Assessment: 71yo male to continue Coumadin for Afib per pharmacy dosing during admission for possible PNA.  INR therapeutic on admit and remains therapeutic today. H/H low but stable. Plts ok. No bleeding reported. Note patient on Levaquin which may increase INR.   Goal of Therapy:  INR 2-3 Monitor platelets by anticoagulation protocol: Yes   Plan:  1. Continue home Coumadin dose of 2.5mg  TTSS and 5mg  MWF and monitor INR for dose  adjustments. 2. Monitor for interaction with Levaquin.   Link Snuffer, PharmD, BCPS Clinical Pharmacist [phone] 05/10/2012 2:17 PM

## 2012-05-10 NOTE — Progress Notes (Signed)
Per Dr. Mariel Sleet, patient is to receive Neupogen SQ daily x 12 days once released from the hospital and starting on 05/11/12.  Schedule made; Supportive Therapy Plan built; called patient's home and left message, since he is currently at [facility].  Will re-attempt contact Wednesday.  Appointment to see T. Kefalas, PA-C in 1 week as well.

## 2012-05-10 NOTE — Progress Notes (Signed)
[facility] PROGRESS NOTE  Charles [name] ZOX:[id number] DOB: 1940-04-23 DOA: 05/07/2012 PCP: [name], MD  Assessment/Plan: Principal Problem:   HCAP (healthcare-associated pneumonia) Active Problems:   TOBACCO ABUSE   HYPERTENSION   Chronic anticoagulation   Follicular lymphoma   Anemia, normocytic normochromic   Neutropenia   Atrial fibrillation with rapid ventricular response   1. CAP in in Neutropenic patient: Patient with known history of follicular lymphoma, who completed chemotherapy in 10/2011, and has been plagued with recurrent pneumonias presenting with a cough, productive of yellow phlegm, a temperature of 101.9. CXR revealed mild bibasilar airspace opacities raising concern for pneumonia. Managing with iv Vancomycin, Cefepime and Levaquin, now day# 4, and patient feels considerably better. He has defervesced, and plegm is now clear. He has no chest pan or SOB. Blood cultures have remained negative so far. Influenza PCR was negative. Will transition to Levaquin monotherapy from 05/11/12. 2. HTN: Patient has known history of HTN, and was continued on pre-admission anti-hypertensives at presentation. On 05/08/12, he became acutely hypotensive, with SBP in the low 80s and high 70s systolic, although he did not appear to be in acute distress. He was bolused 500 ml of NS and antihypertensives discontinued, with resolution of hypotension. He has since remained normotensive.  3. Atrial fibrillation: Patient has chronic atrial fibrillation. He has remained rate-controlled, and is on Coumadin-INR therapeutic.  4. Follicular lymphoma/Neutropenia: At presentation, patient was found to have a wcc of 0.9, with ANC of 0.4. He has a known history of Follicular Lymphoma, diagnosed in 06/2010, and has been under the care of Dr Glenford Peers. Chemotherapy was completed in 10/2011. Per EMR, he has been chronically leukopenic, and wcc was 2.2 on 04/12/12. Currently on neutropenic  precautions and ANC is only 0.5 today. Will consult hematologist today. 5. Cardiomyopathy: Patient has a known history of non-ischemic cardiomyopathy, EF 25-30%. Clinically, he appears well compensated from this view point.    Code Status: Full Code.  Family Communication:  Disposition Plan: To be determined.    Brief narrative: 72 y.o. male with a history of HTN, Atrial fibrillation on chronic anticoagulation, non-ischemic cardiomyopathy, EF 25-30%, PUD, DJD, allergic rhinitis, anxiety, follicular Lymphoma diagnosed in 06/2010. Last chemotherapy was in 10/2011. He presents with complaints of sudden chills and shakes in the afternoon, along with worsening cough. His cough has been productive of yellow sputum. He also reported having fatigue and weakness and decreased appetite. He was found to have a fever in the ED to 101.9. He has had recurring bouts of pneumonia since 01/2012. His oncologist had placed him on a series of injections to boost his immunity and he reports completing the therapy 1 week ago. In the ED, he was evaluated and found to have bibasilar pneumonia and was placed on antibiotic therapy to cover HCAP with IV Vancomycin, Cefepime, and Levaquin. His neutrophil count was found to be 0.9 and 0.4%. Admitted for further management.    Consultants:  N/A.   Procedures:  CXR  Antibiotics:  Cefepime 05/07/12>>>  Levaquin 05/07/12>>>  Vancomycin 05/07/12>>>  HPI/Subjective: Feels much better. Ambulant. Only mild cough with clear phlegm.   Objective: Vital signs in last 24 hours: Temp:  [97.9 F (36.6 C)-98.2 F (36.8 C)] 98 F (36.7 C) (02/17 0442) Pulse Rate:  [77-117] 102 (02/17 0442) Resp:  [18-24] 18 (02/17 0442) BP: (106-134)/(51-95) 134/64 mmHg (02/17 0442) SpO2:  [92 %-97 %] 97 % (02/17 0442) Weight change:  Last BM Date: 05/09/12  Intake/Output from previous day: 02/16  0701 - 02/17 0700 In: 250 [I.V.:200; IV Piggyback:50] Out: 300 [Urine:300]      Physical Exam: General: Comfortable, alert, communicative, fully oriented, not short of breath at rest.  HEENT:  Mild clinical pallor, no jaundice, no conjunctival injection or discharge. NECK:  Supple, JVP not seen, no carotid bruits, no palpable lymphadenopathy, no palpable goiter. CHEST:  Left basal crackles. HEART:  Sounds 1 and 2 heard, normal, irregular, no murmurs. ABDOMEN:  Full, non-tender, no palpable organomegaly, no palpable masses, normal bowel sounds. GENITALIA:  Not examined. LOWER EXTREMITIES:  No pitting edema, palpable peripheral pulses. MUSCULOSKELETAL SYSTEM:  Generalized osteoarthritic changes, otherwise, normal. CENTRAL NERVOUS SYSTEM:  No focal neurologic deficit on gross examination.  Lab Results:  Recent Labs  05/09/12 0600 05/10/12 0604  WBC 1.6* 1.5*  HGB 8.9* 9.7*  HCT 26.8* 29.2*  PLT 170 192    Recent Labs  05/09/12 0600 05/10/12 0604  NA 135 140  K 4.3 3.0*  CL 106 108  CO2 18* 21  GLUCOSE 86 95  BUN 15 9  CREATININE 0.85 0.70  CALCIUM 8.7 9.0   Recent Results (from the past 240 hour(s))  URINE CULTURE     Status: None   Collection Time    05/07/12 10:06 PM      Result Value Range Status   Specimen Description URINE, CLEAN CATCH   Final   Special Requests Immunocompromised   Final   Culture  Setup Time 05/08/2012 05:38   Final   Colony Count NO GROWTH   Final   Culture NO GROWTH   Final   Report Status 05/09/2012 FINAL   Final  CULTURE, BLOOD (ROUTINE X 2)     Status: None   Collection Time    05/07/12 10:15 PM      Result Value Range Status   Specimen Description BLOOD LEFT ARM   Final   Special Requests BOTTLES DRAWN AEROBIC AND ANAEROBIC 10CC EA   Final   Culture  Setup Time 05/08/2012 04:47   Final   Culture     Final   Value:        BLOOD CULTURE RECEIVED NO GROWTH TO DATE CULTURE WILL BE HELD FOR 5 DAYS BEFORE ISSUING A FINAL NEGATIVE REPORT   Report Status PENDING   Incomplete  CULTURE, BLOOD (ROUTINE X 2)      Status: None   Collection Time    05/07/12 10:30 PM      Result Value Range Status   Specimen Description BLOOD LEFT HAND   Final   Special Requests BOTTLES DRAWN AEROBIC ONLY 10CC   Final   Culture  Setup Time 05/08/2012 04:47   Final   Culture     Final   Value:        BLOOD CULTURE RECEIVED NO GROWTH TO DATE CULTURE WILL BE HELD FOR 5 DAYS BEFORE ISSUING A FINAL NEGATIVE REPORT   Report Status PENDING   Incomplete  MRSA PCR SCREENING     Status: None   Collection Time    05/08/12  3:01 AM      Result Value Range Status   MRSA by PCR NEGATIVE  NEGATIVE Final   Comment:            The GeneXpert MRSA Assay (FDA     approved for NASAL specimens     only), is one component of a     comprehensive MRSA colonization     surveillance program. It is not  intended to diagnose MRSA     infection nor to guide or     monitor treatment for     MRSA infections.     Studies/Results: No results found.  Medications: Scheduled Meds: . albuterol  2.5 mg Nebulization Q6H  . ALPRAZolam  0.5 mg Oral QID  . ceFEPime (MAXIPIME) IV  1 g Intravenous Q12H  . diltiazem  300 mg Oral Daily  . levofloxacin (LEVAQUIN) IV  750 mg Intravenous Q24H  . pantoprazole  40 mg Oral Daily  . sodium chloride  500 mL Intravenous Once  . vancomycin  1,250 mg Intravenous Q12H  . warfarin  2.5 mg Oral Q T,Th,S,Su-1800  . warfarin  5 mg Oral Q M,W,F-1800  . Warfarin - Pharmacist Dosing Inpatient   Does not apply q1800   Continuous Infusions: . sodium chloride 100 mL/hr at 05/09/12 0900   PRN Meds:.acetaminophen, acetaminophen, albuterol, alum & mag hydroxide-simeth, HYDROcodone-acetaminophen, HYDROmorphone (DILAUDID) injection, ondansetron (ZOFRAN) IV, ondansetron, oxyCODONE, potassium chloride SA, sodium chloride, traMADol, zolpidem    LOS: 3 days   Hayli Milligan,CHRISTOPHER  [facility] Pager [phone]. If 8PM-8AM, please contact night-coverage at [email or web address], password [facility] 05/10/2012, 8:46 AM  LOS: 3 days

## 2012-05-10 NOTE — Discharge Summary (Signed)
Physician Discharge Summary  BURRIS MATHERNE ZOX:[id number] DOB: 06-12-1940 DOA: 05/07/2012  PCP: [name], MD  Admit date: 05/07/2012 Discharge date: 05/11/2012  Time spent: 40 minutes  Recommendations for Outpatient Follow-up:  1. Follow up with PMD. 2. Follow up with Dr Glenford Peers, hematologist-oncologist. Appointment scheduled for 05/12/12.   Discharge Diagnoses:  Principal Problem:   HCAP (healthcare-associated pneumonia) Active Problems:   TOBACCO ABUSE   HYPERTENSION   Chronic anticoagulation   Follicular lymphoma   Anemia, normocytic normochromic   Neutropenia   Atrial fibrillation with rapid ventricular response   Discharge Condition: Satisfactory.   Diet recommendation: Heart-Healthy.   Filed Weights   05/07/12 2324 05/08/12 0334  Weight: 90.2 kg (198 lb 13.7 oz) 88.1 kg (194 lb 3.6 oz)    History of present illness:  72 y.o. male with a history of HTN, Atrial fibrillation on chronic anticoagulation, non-ischemic cardiomyopathy, EF 25-30%, PUD, DJD, allergic rhinitis, anxiety, follicular Lymphoma diagnosed in 06/2010. Last chemotherapy was in 10/2011. He presents with complaints of sudden chills and shakes in the afternoon, along with worsening cough. His cough has been productive of yellow sputum. He also reported having fatigue and weakness and decreased appetite. He was found to have a fever in the ED to 101.9. He has had recurring bouts of pneumonia since 01/2012. His oncologist had placed him on a series of injections to boost his immunity and he reports completing the therapy 1 week ago. In the ED, he was evaluated and found to have bibasilar pneumonia and was placed on antibiotic therapy to cover HCAP with IV Vancomycin, Cefepime, and Levaquin. His neutrophil count was found to be 0.9 and 0.4%. Admitted for further management.    Hospital Course:  1. CAP in in Neutropenic patient: Patient with known history of follicular lymphoma, who completed  chemotherapy in 10/2011, and has been plagued with recurrent pneumonias, presenting with a cough, productive of yellow phlegm, a temperature of 101.9. CXR revealed mild bibasilar airspace ,raising concern for pneumonia. Managed with iv Vancomycin, Cefepime and Levaquin, with satisfactory clinical improvement. As of 05/10/12, patient felt considerably better, had defervesced, and plegm is now clear. He has no chest pain or SOB during his hospitalization and blood cultures have remained negative. Influenza PCR was negative. Transitioned to oral Levaquin monotherapy from 05/11/12, to complete a further 7 days of antibiotics on 05/17/12.  2. HTN: Patient has known history of HTN, and was continued on pre-admission anti-hypertensives at presentation. On 05/08/12, he became acutely hypotensive, with SBP in the low 80s and high 70s, although he did not appear to be in acute distress. He was bolused 500 ml of NS and antihypertensives discontinued, with resolution of hypotension. He has since remained normotensive. Anti-hypertensives have since been recommenced and tolerated. 3. Atrial fibrillation: Patient has chronic atrial fibrillation. He remained rate-controlled, during this hospitalization and on Coumadin. INR was therapeutic.  4. Follicular lymphoma/Neutropenia: At presentation, patient was found to have a wcc of 0.9, with ANC of 0.4. He has a known history of Follicular Lymphoma, diagnosed in 06/2010, and has been under the care of Dr Glenford Peers. Chemotherapy was completed in 10/2011. Per EMR, he has been chronically leukopenic, and wcc was 2.2 on 04/12/12. He was placed on neutropenic precautions, and CBC followed. As of 05/10/12, ANC was only 0.5. I did have a telephone discussion with Dr Arbutus Ped, hematologist-oncologist on-call, and also with Dr Glenford Peers, patient's primary hematologist-oncologist on that date, and consensus was to commence daily Neupogen injections. Patient will  follow up with Dr Mariel Sleet  from 05/12/12, to continue therapy. Arrangements are already in place. On 05/11/12, ANC was 0.6 5. Cardiomyopathy: Patient has a known history of non-ischemic cardiomyopathy, EF 25-30%. Clinically, he appears well compensated from this view point.    Procedures:  See Below.   Consultations:  N/A telephone discussion with dr Arizona Constable. Mohammed, oncologist, on 05/10/12.  Telephone discussion with Dr Glenford Peers, oncologist, on 05/10/12.   Discharge Exam: Filed Vitals:   05/10/12 0903 05/10/12 1350 05/10/12 2140 05/11/12 0500  BP:  139/77 140/89 132/86  Pulse:  106 97 109  Temp:  97.3 F (36.3 C) 97.5 F (36.4 C) 98.3 F (36.8 C)  TempSrc:  Oral Oral Oral  Resp:  18 18 18   Height:      Weight:      SpO2: 97% 99% 100% 99%    General: Comfortable, alert, communicative, fully oriented, not short of breath at rest.  HEENT: Mild clinical pallor, no jaundice, no conjunctival injection or discharge.  NECK: Supple, JVP not seen, no carotid bruits, no palpable lymphadenopathy, no palpable goiter.  CHEST: Left basal crackles.  HEART: Sounds 1 and 2 heard, normal, irregular, no murmurs.  ABDOMEN: Full, non-tender, no palpable organomegaly, no palpable masses, normal bowel sounds.  GENITALIA: Not examined.  LOWER EXTREMITIES: No pitting edema, palpable peripheral pulses.  MUSCULOSKELETAL SYSTEM: Generalized osteoarthritic changes, otherwise, normal.  CENTRAL NERVOUS SYSTEM: No focal neurologic deficit on gross examination.  Discharge Instructions      Discharge Orders   Future Appointments Provider Department Dept Phone   05/12/2012 4:30 PM [facility] Chair 7 [facility] [phone]   05/13/2012 2:30 PM [facility] Chair 7 [facility] [phone]   05/14/2012 4:30 PM [facility] Chair 7 [facility] [phone]   05/15/2012 10:30 AM [facility] Chair 7 [facility] [phone]   05/16/2012 10:30 AM [facility] Chair 7 [facility]  [phone]   05/17/2012 2:30 PM [facility] Chair 7 [facility] [phone]   05/17/2012 3:10 PM [facility] [place] [phone]   05/18/2012 10:00 AM Ellouise Newer, PA [facility] [phone]   05/18/2012 10:30 AM [facility] Chair 7 [facility] [phone]   05/19/2012 2:30 PM [facility] Chair 7 [facility] [phone]   05/20/2012 2:00 PM [facility] Chair 7 [facility] [phone]   05/21/2012 3:30 PM [facility] Chair 7 [facility] [phone]   05/22/2012 11:00 AM [facility] Chair 7 [facility] [phone]   05/23/2012 11:00 AM [facility] Chair 7 [facility] [phone]   07/12/2012 12:00 PM Ellouise Newer, [facility] [phone]   08/17/2012 11:00 AM Kathlen Brunswick, MD [facility] [place] [phone]   Future Orders Complete By Expires     Diet - low sodium heart healthy  As directed     Increase activity slowly  As directed         Medication List    STOP taking these medications       doxycycline 100 MG EC tablet  Commonly known as:  DORYX      TAKE these medications       ALPRAZolam 1 MG tablet  Commonly known as:  XANAX  Take 0.5 mg by mouth 4 (four) times daily.     carvedilol 25 MG tablet  Commonly known as:  COREG  Take 25 mg by mouth 2 (two) times  daily. 1/2 tablet if BP is below 10/70     diltiazem 300 MG 24 hr capsule  Commonly known as:  CARDIZEM CD  Take 1 capsule (300 mg total) by mouth daily.     filgrastim 480 MCG/1.6ML injection  Commonly known as:  NEUPOGEN  Inject 1.6 mLs (480 mcg total) into the skin daily at 12 noon.     furosemide 40 MG tablet  Commonly known as:  LASIX  Take 20-40 mg by mouth daily as needed (for fluid retention). Pt can take a half to 1 tab as needed for fluid retention     HYDROcodone-acetaminophen 5-325 MG per tablet  Commonly known as:  NORCO/VICODIN  Take 1  tablet by mouth every 6 (six) hours as needed for pain.     levofloxacin 500 MG tablet  Commonly known as:  LEVAQUIN  Take 1 tablet (500 mg total) by mouth daily.     lovastatin 10 MG tablet  Commonly known as:  MEVACOR  Take 10 mg by mouth daily.     omeprazole 20 MG capsule  Commonly known as:  PRILOSEC  Take 20 mg by mouth daily.     ONE-TABLET-DAILY/IRON PO  Take 1 tablet by mouth daily.     potassium chloride SA 20 MEQ tablet  Commonly known as:  K-DUR,KLOR-CON  Take 20 mEq by mouth daily as needed (takes along with lasix).     PROAIR HFA 108 (90 BASE) MCG/ACT inhaler  Generic drug:  albuterol  Inhale 1 puff into the lungs every 6 (six) hours as needed. For shortness of breath     traMADol 50 MG tablet  Commonly known as:  ULTRAM  Take 50-100 mg by mouth 4 (four) times daily as needed. For pain     warfarin 5 MG tablet  Commonly known as:  COUMADIN  Take 2.5-5 mg by mouth daily. Pt takes 2.5mg  on  sun, tue, thu , sat and 5 mg all other days       Follow-up Information   Schedule an appointment as soon as possible for a visit with [name], MD.   Contact information:   [address] [place] [address] [phone]       Schedule an appointment as soon as possible for a visit with Randall An, MD.   Contact information:   [address] [place] [address] [phone]        The results of significant diagnostics from this hospitalization (including imaging, microbiology, ancillary and laboratory) are listed below for reference.    Significant Diagnostic Studies: Ct Chest W Contrast  04/13/2012  *RADIOLOGY REPORT*  Clinical Data: Persistent lung infection and history of lymphoma. Question obstruction versus lymphoma.  History of "bone cancer."  CT CHEST WITH CONTRAST  Technique:  Multidetector CT imaging of the chest was performed following the standard protocol during bolus administration of intravenous contrast.   Contrast: 80mL OMNIPAQUE IOHEXOL 300 MG/ML  SOLN  Comparison: Plain film 02/03/2012.  Most recent CT 09/08/2011.  P E T of 06/06/2011.  Findings: Lungs/pleura: Beam hardening artifact superiorly from right proximal humerus fixation.  Patent endobronchial tree, including to the lung bases.  There is mild nonspecific bronchial wall thickening at the lung bases.  Mild right upper lobe reticular nodular opacities on image 26/series 3. Minimal right middle lobe volume loss posteriorly, favored to represent atelectasis.  Image 36/series 3. Minimal nonspecific interstitial prominence at the right lung base.  Confluent airspace and reticulonodular opacity  at the left lung base.  Trace right-sided pleural fluid or thickening which is new since 09/08/2011.  Heart/Mediastinum: A left-sided Port-A-Cath which terminates at the left brachiocephalic l vein, similar.  Tortuous descending thoracic aorta.  Mild cardiomegaly with left atrial enlargement. Multivessel coronary artery atherosclerosis.  No mediastinal or hilar adenopathy.  Upper abdomen: Probable cyst adjacent the falciform ligament within the left lobe of the liver.  Tiny perfusion anomaly in the left hepatic dome.  Mild right adrenal nodularity which is chronic. Left renal cysts.  Bones/Musculoskeletal:  No acute osseous abnormality.  IMPRESSION:  1.  Left greater than right airspace and reticulonodular opacities. Most consistent with infection or less likely aspiration.  No evidence of endobronchial obstruction, adenopathy, or other findings to suggest recurrent lymphoma within the chest. 2.  Trace right-sided pleural fluid or thickening. 3. Multivessel coronary artery atherosclerosis.   Original Report Authenticated By: Jeronimo Greaves, M.D.    Dg Chest Port 1 View  05/07/2012  *RADIOLOGY REPORT*  Clinical Data: Fever and cough.  History of smoking.  PORTABLE CHEST - 1 VIEW  Comparison: Chest radiograph performed 02/03/2012, and CT of the chest performed 04/13/2012   Findings: The lungs are well-aerated.  Mild bibasilar airspace opacities raise concern for pneumonia, given the patient's symptoms.  There is no evidence of pleural effusion or pneumothorax.  The patient's left-sided chest port is noted ending about the proximal SVC.  The cardiomediastinal silhouette is borderline normal in size.  No acute osseous abnormalities are seen.  A right humeral head prosthesis is grossly unremarkable in appearance. Mild underlying degenerative change is seen.  IMPRESSION: Mild bibasilar airspace opacities raise concern for pneumonia, given clinical concern.   Original Report Authenticated By: Tonia Ghent, M.D.     Microbiology: Recent Results (from the past 240 hour(s))  URINE CULTURE     Status: None   Collection Time    05/07/12 10:06 PM      Result Value Range Status   Specimen Description URINE, CLEAN CATCH   Final   Special Requests Immunocompromised   Final   Culture  Setup Time 05/08/2012 05:38   Final   Colony Count NO GROWTH   Final   Culture NO GROWTH   Final   Report Status 05/09/2012 FINAL   Final  CULTURE, BLOOD (ROUTINE X 2)     Status: None   Collection Time    05/07/12 10:15 PM      Result Value Range Status   Specimen Description BLOOD LEFT ARM   Final   Special Requests BOTTLES DRAWN AEROBIC AND ANAEROBIC 10CC EA   Final   Culture  Setup Time 05/08/2012 04:47   Final   Culture     Final   Value:        BLOOD CULTURE RECEIVED NO GROWTH TO DATE CULTURE WILL BE HELD FOR 5 DAYS BEFORE ISSUING A FINAL NEGATIVE REPORT   Report Status PENDING   Incomplete  CULTURE, BLOOD (ROUTINE X 2)     Status: None   Collection Time    05/07/12 10:30 PM      Result Value Range Status   Specimen Description BLOOD LEFT HAND   Final   Special Requests BOTTLES DRAWN AEROBIC ONLY 10CC   Final   Culture  Setup Time 05/08/2012 04:47   Final   Culture     Final   Value:        BLOOD CULTURE RECEIVED NO GROWTH TO DATE CULTURE WILL BE HELD FOR 5 DAYS BEFORE  ISSUING A  FINAL NEGATIVE REPORT   Report Status PENDING   Incomplete  MRSA PCR SCREENING     Status: None   Collection Time    05/08/12  3:01 AM      Result Value Range Status   MRSA by PCR NEGATIVE  NEGATIVE Final   Comment:            The GeneXpert MRSA Assay (FDA     approved for NASAL specimens     only), is one component of a     comprehensive MRSA colonization     surveillance program. It is not     intended to diagnose MRSA     infection nor to guide or     monitor treatment for     MRSA infections.     Labs: Basic Metabolic Panel:  Recent Labs Lab 05/07/12 2222 05/08/12 0640 05/09/12 0600 05/10/12 0604 05/11/12 0545  NA 138 134* 135 140 143  K 4.2 3.8 4.3 3.0* 3.2*  CL 101 101 106 108 107  CO2 26 22 18* 21 24  GLUCOSE 127* 103* 86 95 84  BUN 13 13 15 9 8   CREATININE 0.88 0.90 0.85 0.70 0.68  CALCIUM 9.2 8.8 8.7 9.0 9.1   Liver Function Tests: No results found for this basename: AST, ALT, ALKPHOS, BILITOT, PROT, ALBUMIN,  in the last 168 hours No results found for this basename: LIPASE, AMYLASE,  in the last 168 hours No results found for this basename: AMMONIA,  in the last 168 hours CBC:  Recent Labs Lab 05/07/12 2230 05/08/12 0640 05/09/12 0600 05/10/12 0604 05/10/12 0919 05/11/12 0545  WBC 0.9* 1.5* 1.6* 1.5* 1.8* 2.2*  NEUTROABS 0.4*  --   --   --  0.5* 0.6*  HGB 11.4* 10.3* 8.9* 9.7* 10.0* 10.5*  HCT 35.1* 31.4* 26.8* 29.2* 30.6* 31.4*  MCV 86.0 84.2 85.4 84.1 85.2 83.5  PLT 210 190 170 192 187 227   Cardiac Enzymes: No results found for this basename: CKTOTAL, CKMB, CKMBINDEX, TROPONINI,  in the last 168 hours BNP: BNP (last 3 results) No results found for this basename: PROBNP,  in the last 8760 hours CBG: No results found for this basename: GLUCAP,  in the last 168 hours     Signed:  Anterrio Mccleery,CHRISTOPHER  [facility] 05/11/2012, 9:10 AM

## 2012-05-11 ENCOUNTER — Telehealth (HOSPITAL_COMMUNITY): Payer: Self-pay | Admitting: Oncology

## 2012-05-11 LAB — CBC WITH DIFFERENTIAL/PLATELET
Basophils Relative: 4 % — ABNORMAL HIGH (ref 0–1)
Eosinophils Relative: 27 % — ABNORMAL HIGH (ref 0–5)
HCT: 31.4 % — ABNORMAL LOW (ref 39.0–52.0)
Hemoglobin: 10.5 g/dL — ABNORMAL LOW (ref 13.0–17.0)
MCH: 27.9 pg (ref 26.0–34.0)
MCV: 83.5 fL (ref 78.0–100.0)
Monocytes Absolute: 0.5 10*3/uL (ref 0.1–1.0)
Neutro Abs: 0.6 10*3/uL — ABNORMAL LOW (ref 1.7–7.7)
Neutrophils Relative %: 28 % — ABNORMAL LOW (ref 43–77)
RBC: 3.76 MIL/uL — ABNORMAL LOW (ref 4.22–5.81)

## 2012-05-11 LAB — BASIC METABOLIC PANEL
CO2: 24 mEq/L (ref 19–32)
Calcium: 9.1 mg/dL (ref 8.4–10.5)
Glucose, Bld: 84 mg/dL (ref 70–99)
Sodium: 143 mEq/L (ref 135–145)

## 2012-05-11 MED ORDER — FILGRASTIM 480 MCG/1.6ML IJ SOLN: 480.0000 ug | Freq: Every day | INTRAMUSCULAR | Status: DC

## 2012-05-11 MED ORDER — LEVOFLOXACIN 500 MG PO TABS: 500.0000 mg | ORAL_TABLET | Freq: Every day | ORAL | Status: DC

## 2012-05-11 MED ORDER — HEPARIN SOD (PORK) LOCK FLUSH 100 UNIT/ML IV SOLN
500.0000 [IU] | INTRAVENOUS | Status: DC | PRN
  Administered 2012-05-11: 500 [IU]
  Filled 2012-05-11: qty 5

## 2012-05-11 MED ORDER — HEPARIN SOD (PORK) LOCK FLUSH 100 UNIT/ML IV SOLN
500.0000 [IU] | INTRAVENOUS | Status: DC
  Filled 2012-05-11: qty 5

## 2012-05-11 NOTE — Care Management Note (Signed)
    Page 1 of 1   05/11/2012     4:14:25 PM   CARE MANAGEMENT NOTE 05/11/2012  Patient:  Charles [name], Charles [name]   Account Number:  [id number]  Date Initiated:  05/11/2012  Documentation initiated by:  Daryn Hicks  Subjective/Objective Assessment:   PT ADM ON 05/07/12 WITH PNA, AFIB.  PTA, PT INDEPENDENT, LIVES WITH FAMILY.     Action/Plan:   WILL FOLLOW FOR HOME NEEDS AS PT PROGRESSES.   Anticipated DC Date:  05/11/2012   Anticipated DC Plan:  HOME/SELF CARE      DC Planning Services  CM consult      Choice offered to / List presented to:             Status of service:  Completed, signed off Medicare Important Message given?   (If response is "NO", the following Medicare IM given date fields will be blank) Date Medicare IM given:   Date Additional Medicare IM given:    Discharge Disposition:  HOME/SELF CARE  Per UR Regulation:  Reviewed for med. necessity/level of care/duration of stay  If discussed at Long Length of Stay Meetings, dates discussed:    Comments:

## 2012-05-11 NOTE — Telephone Encounter (Signed)
[name] contacted to confirm he was aware of his Neupogen and MD visit appointments - Charles [name] was advised of the plan of care that Dr. Mariel Sleet has prescribed for him.  No questions at this time and patient verbalized understanding of current plan of care.

## 2012-05-12 ENCOUNTER — Encounter (HOSPITAL_BASED_OUTPATIENT_CLINIC_OR_DEPARTMENT_OTHER): Payer: Medicare Other

## 2012-05-12 VITALS — BP 122/79 | HR 103

## 2012-05-12 DIAGNOSIS — Z87898 Personal history of other specified conditions: Secondary | ICD-10-CM

## 2012-05-12 DIAGNOSIS — J189 Pneumonia, unspecified organism: Secondary | ICD-10-CM

## 2012-05-12 DIAGNOSIS — D709 Neutropenia, unspecified: Secondary | ICD-10-CM

## 2012-05-12 MED ORDER — FILGRASTIM 480 MCG/1.6ML IJ SOLN
480.0000 ug | Freq: Once | INTRAMUSCULAR | Status: AC
  Administered 2012-05-12: 480 ug via SUBCUTANEOUS
  Filled 2012-05-12: qty 1.6

## 2012-05-12 NOTE — Progress Notes (Signed)
Marston O Diefendorf presents today for injection per MD orders. Neupogen 480 mcg administered SQ in left Abdomen. Administration without incident. Patient tolerated well.  

## 2012-05-13 ENCOUNTER — Ambulatory Visit (HOSPITAL_COMMUNITY)
Admission: RE | Admit: 2012-05-13 | Discharge: 2012-05-13 | Disposition: A | Discharge status: Home / Self Care | Payer: Medicare Other | Source: Ambulatory Visit | Attending: Oncology | Admitting: Oncology

## 2012-05-13 ENCOUNTER — Encounter (HOSPITAL_BASED_OUTPATIENT_CLINIC_OR_DEPARTMENT_OTHER): Payer: Medicare Other

## 2012-05-13 VITALS — BP 136/72 | HR 112 | Temp 98.2°F | Resp 20

## 2012-05-13 DIAGNOSIS — R05 Cough: Secondary | ICD-10-CM | POA: Insufficient documentation

## 2012-05-13 DIAGNOSIS — J189 Pneumonia, unspecified organism: Secondary | ICD-10-CM

## 2012-05-13 DIAGNOSIS — Z87898 Personal history of other specified conditions: Secondary | ICD-10-CM

## 2012-05-13 DIAGNOSIS — D709 Neutropenia, unspecified: Secondary | ICD-10-CM

## 2012-05-13 DIAGNOSIS — R059 Cough, unspecified: Secondary | ICD-10-CM | POA: Insufficient documentation

## 2012-05-13 MED ORDER — FILGRASTIM 480 MCG/0.8ML IJ SOLN
480.0000 ug | Freq: Once | INTRAMUSCULAR | Status: AC
  Administered 2012-05-13: 480 ug via SUBCUTANEOUS

## 2012-05-13 MED FILL — Filgrastim Inj 480 MCG/1.6ML (300 MCG/ML): INTRAMUSCULAR | Qty: 1.6 | Status: AC

## 2012-05-13 NOTE — Progress Notes (Signed)
Charles [name] presents today for injection per MD orders. Neupogen 480 mcg administered SQ in right Abdomen. Administration without incident. Patient tolerated well.  

## 2012-05-14 ENCOUNTER — Other Ambulatory Visit (HOSPITAL_COMMUNITY): Payer: Self-pay | Admitting: Oncology

## 2012-05-14 ENCOUNTER — Encounter (HOSPITAL_BASED_OUTPATIENT_CLINIC_OR_DEPARTMENT_OTHER): Payer: Medicare Other

## 2012-05-14 DIAGNOSIS — Z87898 Personal history of other specified conditions: Secondary | ICD-10-CM

## 2012-05-14 DIAGNOSIS — D709 Neutropenia, unspecified: Secondary | ICD-10-CM

## 2012-05-14 LAB — CULTURE, BLOOD (ROUTINE X 2)
Culture: NO GROWTH
Culture: NO GROWTH

## 2012-05-14 MED ORDER — FILGRASTIM 480 MCG/1.6ML IJ SOLN
480.0000 ug | Freq: Once | INTRAMUSCULAR | Status: AC
  Administered 2012-05-14: 480 ug via SUBCUTANEOUS
  Filled 2012-05-14: qty 1.6

## 2012-05-14 NOTE — Progress Notes (Signed)
Charles [name] presents today for injection per MD orders. Neupogen 480 mcg administered SQ in left Abdomen. Administration without incident. Patient tolerated well.  

## 2012-05-15 ENCOUNTER — Encounter (HOSPITAL_BASED_OUTPATIENT_CLINIC_OR_DEPARTMENT_OTHER): Payer: Medicare Other

## 2012-05-15 VITALS — BP 118/88 | HR 112 | Temp 97.7°F | Resp 20

## 2012-05-15 DIAGNOSIS — J189 Pneumonia, unspecified organism: Secondary | ICD-10-CM

## 2012-05-15 DIAGNOSIS — D709 Neutropenia, unspecified: Secondary | ICD-10-CM

## 2012-05-15 DIAGNOSIS — Z87898 Personal history of other specified conditions: Secondary | ICD-10-CM

## 2012-05-15 MED ORDER — FILGRASTIM 480 MCG/1.6ML IJ SOLN
480.0000 ug | Freq: Once | INTRAMUSCULAR | Status: AC
  Administered 2012-05-15: 480 ug via SUBCUTANEOUS
  Filled 2012-05-15: qty 1.6

## 2012-05-15 NOTE — Progress Notes (Signed)
Charles [name] presents today for injection per MD orders. Neupogen 480 administered SQ in left Abdomen. Administration without incident. Patient tolerated well.

## 2012-05-16 ENCOUNTER — Encounter (HOSPITAL_BASED_OUTPATIENT_CLINIC_OR_DEPARTMENT_OTHER): Payer: Medicare Other

## 2012-05-16 VITALS — BP 126/87 | HR 94 | Temp 97.8°F | Resp 18

## 2012-05-16 DIAGNOSIS — D709 Neutropenia, unspecified: Secondary | ICD-10-CM

## 2012-05-16 DIAGNOSIS — Z87898 Personal history of other specified conditions: Secondary | ICD-10-CM

## 2012-05-16 DIAGNOSIS — J189 Pneumonia, unspecified organism: Secondary | ICD-10-CM

## 2012-05-16 MED ORDER — FILGRASTIM 480 MCG/1.6ML IJ SOLN
480.0000 ug | Freq: Once | INTRAMUSCULAR | Status: DC
  Filled 2012-05-16: qty 1.6

## 2012-05-16 MED ORDER — FILGRASTIM 480 MCG/0.8ML IJ SOLN
480.0000 ug | Freq: Once | INTRAMUSCULAR | Status: DC
  Administered 2012-05-16: 480 ug via SUBCUTANEOUS

## 2012-05-16 NOTE — Progress Notes (Unsigned)
Charles [name] presents today for injection per MD orders. Neupogen 480 mcg administered SQ in right Abdomen. Administration without incident. Patient tolerated well.  

## 2012-05-17 ENCOUNTER — Other Ambulatory Visit: Payer: Self-pay | Admitting: Cardiology

## 2012-05-17 ENCOUNTER — Encounter (HOSPITAL_BASED_OUTPATIENT_CLINIC_OR_DEPARTMENT_OTHER): Payer: Medicare Other

## 2012-05-17 ENCOUNTER — Ambulatory Visit (INDEPENDENT_AMBULATORY_CARE_PROVIDER_SITE_OTHER): Payer: Medicare Other | Admitting: *Deleted

## 2012-05-17 VITALS — BP 120/68 | HR 84 | Temp 96.5°F | Resp 18

## 2012-05-17 DIAGNOSIS — D709 Neutropenia, unspecified: Secondary | ICD-10-CM

## 2012-05-17 DIAGNOSIS — C8290 Follicular lymphoma, unspecified, unspecified site: Secondary | ICD-10-CM

## 2012-05-17 DIAGNOSIS — Z7901 Long term (current) use of anticoagulants: Secondary | ICD-10-CM

## 2012-05-17 DIAGNOSIS — I4891 Unspecified atrial fibrillation: Secondary | ICD-10-CM

## 2012-05-17 LAB — CBC WITH DIFFERENTIAL/PLATELET
Basophils Absolute: 0.1 10*3/uL (ref 0.0–0.1)
Eosinophils Absolute: 0.7 10*3/uL (ref 0.0–0.7)
HCT: 30.9 % — ABNORMAL LOW (ref 39.0–52.0)
Lymphocytes Relative: 12 % (ref 12–46)
MCHC: 32.4 g/dL (ref 30.0–36.0)
Monocytes Relative: 11 % (ref 3–12)
Neutro Abs: 4.3 10*3/uL (ref 1.7–7.7)
Neutrophils Relative %: 66 % (ref 43–77)
RDW: 18.1 % — ABNORMAL HIGH (ref 11.5–15.5)
WBC: 6.6 10*3/uL (ref 4.0–10.5)

## 2012-05-17 LAB — POCT INR: INR: 3.3

## 2012-05-17 LAB — COMPREHENSIVE METABOLIC PANEL
ALT: 9 U/L (ref 0–53)
AST: 16 U/L (ref 0–37)
Albumin: 3.6 g/dL (ref 3.5–5.2)
Alkaline Phosphatase: 123 U/L — ABNORMAL HIGH (ref 39–117)
Calcium: 9.2 mg/dL (ref 8.4–10.5)
GFR calc Af Amer: >90 mL/min (ref >90)
Potassium: 3.4 mEq/L — ABNORMAL LOW (ref 3.5–5.1)
Sodium: 140 mEq/L (ref 135–145)
Total Protein: 7.1 g/dL (ref 6.0–8.3)

## 2012-05-17 MED ORDER — FILGRASTIM 480 MCG/1.6ML IJ SOLN
480.0000 ug | Freq: Once | INTRAMUSCULAR | Status: AC
  Administered 2012-05-17: 480 ug via SUBCUTANEOUS
  Filled 2012-05-17: qty 1.6

## 2012-05-17 MED ORDER — DILTIAZEM HCL ER COATED BEADS 300 MG PO CP24: 300.0000 mg | ORAL_CAPSULE | Freq: Every day | ORAL | Status: DC

## 2012-05-17 MED ORDER — WARFARIN SODIUM 5 MG PO TABS: 2.5000 mg | ORAL_TABLET | Freq: Every day | ORAL | Status: DC

## 2012-05-17 MED FILL — Filgrastim Inj 480 MCG/1.6ML (300 MCG/ML): INTRAMUSCULAR | Qty: 1.6 | Status: AC

## 2012-05-17 NOTE — Progress Notes (Signed)
Charles [name] presents today for injection per MD orders. Neupogen 480 mcg administered SQ in right Abdomen. Administration without incident. Patient tolerated well. Charles [name] presented for labwork. Labs per MD order drawn via Peripheral Line 25 gauge needle inserted in rt ac.  Good blood return present. Procedure without incident.  Needle removed intact. Patient tolerated procedure well.

## 2012-05-18 ENCOUNTER — Encounter (HOSPITAL_COMMUNITY): Payer: Self-pay | Admitting: Oncology

## 2012-05-18 ENCOUNTER — Encounter (HOSPITAL_BASED_OUTPATIENT_CLINIC_OR_DEPARTMENT_OTHER): Payer: Medicare Other | Admitting: Oncology

## 2012-05-18 ENCOUNTER — Encounter (HOSPITAL_BASED_OUTPATIENT_CLINIC_OR_DEPARTMENT_OTHER): Payer: Medicare Other

## 2012-05-18 ENCOUNTER — Telehealth (HOSPITAL_COMMUNITY): Payer: Self-pay | Admitting: Oncology

## 2012-05-18 VITALS — BP 132/76 | HR 120 | Temp 97.3°F | Resp 22 | Wt 197.5 lb

## 2012-05-18 DIAGNOSIS — C8290 Follicular lymphoma, unspecified, unspecified site: Secondary | ICD-10-CM

## 2012-05-18 DIAGNOSIS — J449 Chronic obstructive pulmonary disease, unspecified: Secondary | ICD-10-CM

## 2012-05-18 DIAGNOSIS — C8299 Follicular lymphoma, unspecified, extranodal and solid organ sites: Secondary | ICD-10-CM

## 2012-05-18 DIAGNOSIS — D709 Neutropenia, unspecified: Secondary | ICD-10-CM

## 2012-05-18 DIAGNOSIS — F411 Generalized anxiety disorder: Secondary | ICD-10-CM

## 2012-05-18 DIAGNOSIS — J189 Pneumonia, unspecified organism: Secondary | ICD-10-CM

## 2012-05-18 MED ORDER — FILGRASTIM 480 MCG/1.6ML IJ SOLN
480.0000 ug | Freq: Once | INTRAMUSCULAR | Status: AC
  Administered 2012-05-18: 480 ug via SUBCUTANEOUS
  Filled 2012-05-18: qty 1.6

## 2012-05-18 NOTE — Telephone Encounter (Signed)
FAXED REFERRAL TO INF DISEASE [phone] ATTN JAMIE

## 2012-05-18 NOTE — Patient Instructions (Addendum)
[facility] Discharge Instructions  RECOMMENDATIONS MADE BY THE CONSULTANT AND ANY TEST RESULTS WILL BE SENT TO YOUR REFERRING PHYSICIAN.  EXAM FINDINGS BY THE PHYSICIAN TODAY AND SIGNS OR SYMPTOMS TO REPORT TO CLINIC OR PRIMARY PHYSICIAN: Exam and discussion by PA.  Report fevers, chills, night sweats, etc.  MEDICATIONS PRESCRIBED:  none  INSTRUCTIONS GIVEN AND DISCUSSED: Will make referral to Infectious Diseases  SPECIAL INSTRUCTIONS/FOLLOW-UP: Lab work in 6 weeks and to be seen in 4 months.  Thank you for choosing [facility] to provide your oncology and hematology care.  To afford each patient quality time with our providers, please arrive at least 15 minutes before your scheduled appointment time.  With your help, our goal is to use those 15 minutes to complete the necessary work-up to ensure our physicians have the information they need to help with your evaluation and healthcare recommendations.    Effective January 1st, 2014, we ask that you re-schedule your appointment with our physicians should you arrive 10 or more minutes late for your appointment.  We strive to give you quality time with our providers, and arriving late affects you and other patients whose appointments are after yours.    Again, thank you for choosing [facility].  Our hope is that these requests will decrease the amount of time that you wait before being seen by our physicians.       _____________________________________________________________  Should you have questions after your visit to [facility], please contact our office at [phone] between the hours of 8:30 a.m. and 5:00 p.m.  Voicemails left after 4:30 p.m. will not be returned until the following business day.  For prescription refill requests, have your pharmacy contact our office with your prescription refill request.

## 2012-05-18 NOTE — Progress Notes (Signed)
Claudio O Doublin presents today for injection per MD orders. Neupogen 480 mcg administered SQ in left Abdomen. Administration without incident. Patient tolerated well.  

## 2012-05-18 NOTE — Progress Notes (Signed)
Charles Obey, MD [address] [place] [address]  Follicular lymphoma  HCAP (healthcare-associated pneumonia)  CURRENT THERAPY: On Charles [name] 480 mcg daily  INTERVAL HISTORY: Charles Charles [name] 72 y.o. male returns for  regular  visit for followup of HCAP.    Charles Charles [name] is scheduled to receive 12 doses of Charles [name] 480 mcg SQ daily.  He is 7/12 presently and is to receive dose 8 today.    Due to this infection, which is his second infection within the last 6 months or so, we will have the patient consulted by Infectious Disease at the [facility] in the month of March 2014.  After his first pneumonia infection, we discontinued his maintenance Rituxan therapy.  His last Rituxan dose was 12/09/2011. He was treated for 6 cycles of bendamustine + Rituxan, finishing on 11/05/2010.  Charles [name] is doing well.  He reports that the evening EMS was called who transported him to [facility] for pneumonia, he started to have right shoulder pain.  He ate dinner and felt fine other than the right shoulder pain.  He put a heating pad on the shoulder and as time went on, he became colder and colder.  He admits to shaking chills at that time.  EMS was contacted and he was admitted to [facility]    He reports that he walked yesterday and had some "burning" in both of his calves.  That has resolved today.   He reports that he is unable to perform the "things" he used to do.  He admits that he is driving and walking, but he is less active now.    He admits to a cough productive of clear/white sputum.  He denies any fevers and shaking chills.  So Charles Charles [name].  We will refer him to Infectious Disease to see if there is anything on our end that we should do or consider.  We will see him back at the beginning of April with labs.   Otherwise, Charles Charles [name] denies any complaints and ROS questioning is otherwise negative.    He admits that he quit smoking for  good.   Past Medical History  Diagnosis Date  . Hypertension   . Atrial fibrillation 10/2008  . Nonischemic cardiomyopathy 02/2009    Presented with CHF; EF of 25-30%; Nonobstructive ASCVD-worst lesion 40% on coronary angiography  . Peptic ulcer disease   . Degenerative joint disease     Knees and shoulders  . Pneumonia 2010  . Low TSH level     Borderline  . Allergic rhinitis   . Anxiety   . Drug abuse   . Tobacco abuse     50 pack years  . 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 Basal cell carcinoma     has THYROID STIMULATING HORMONE, ABNORMAL; TOBACCO ABUSE; INSOMNIA, CHRONIC; HYPERTENSION; GASTRIC ULCER, HX OF; Atrial fibrillation; Chronic anticoagulation; Follicular lymphoma; Hyperlipidemia; Anemia, normocytic normochromic; HCAP (healthcare-associated pneumonia); Neutropenia; and Atrial fibrillation with rapid ventricular response on his problem list.     is allergic to statins.  Charles Charles [name] had no medications administered during this visit.  Past Surgical History  Procedure Laterality Date  . Neck lesion biopsy  June 24, 2010    Follicular lymphoma  . Skin cancer excision      under left breast; variously described as melanoma and basal cell  . Rotator cuff repair      right  . Patella fracture surgery  1975  . Hematoma evacuation  after melanoma removal  . Portacath placement  07/12/2010  . Colonoscopy  2009    Also underwent an EGD; patient reports no significant findings    Denies any headaches, dizziness, double vision, fevers, chills, night sweats, nausea, vomiting, diarrhea, constipation, chest pain, heart palpitations, shortness of breath, blood in stool, black tarry stool, urinary pain, urinary burning, urinary frequency, hematuria.   PHYSICAL EXAMINATION  ECOG PERFORMANCE STATUS: 2 - Symptomatic, <50% confined to bed  Filed Vitals:   05/18/12 1000  BP: 132/76  Pulse: 120  Temp: 97.3 F (36.3 C)  Resp: 22    GENERAL:alert, no distress,  comfortable, cooperative, smiling and mask on, chronically ill-appearing SKIN: skin color, texture, turgor are normal, no rashes or significant lesions HEAD: Normocephalic, No masses, lesions, tenderness or abnormalities EYES: normal, Conjunctiva are pink and non-injected EARS: External ears normal OROPHARYNX:mucous membranes are moist  NECK: supple, trachea midline LYMPH:  no palpable lymphadenopathy BREAST:not examined LUNGS: positive findings: rhonchi  bibasilar HEART: regular rate & rhythm, no murmurs, no gallops, S1 normal and S2 normal ABDOMEN:abdomen soft, non-tender and normal bowel sounds BACK: Back symmetric, no curvature., No CVA tenderness EXTREMITIES:less then 2 second capillary refill, no joint deformities, effusion, or inflammation, no edema, no skin discoloration, no clubbing, no cyanosis  NEURO: alert & oriented x 3 with fluent speech, no focal motor/sensory deficits, gait normal   LABORATORY DATA: CBC    Component Value Date/Time   WBC 6.6 05/17/2012 1455   RBC 3.59* 05/17/2012 1455   HGB 10.0* 05/17/2012 1455   HCT 30.9* 05/17/2012 1455   PLT 180 05/17/2012 1455   MCV 86.1 05/17/2012 1455   MCH 27.9 05/17/2012 1455   MCHC 32.4 05/17/2012 1455   RDW 18.1* 05/17/2012 1455   LYMPHSABS 0.8 05/17/2012 1455   MONOABS 0.7 05/17/2012 1455   EOSABS 0.7 05/17/2012 1455   BASOSABS 0.1 05/17/2012 1455      Chemistry      Component Value Date/Time   NA 140 05/17/2012 1455   K 3.4* 05/17/2012 1455   CL 103 05/17/2012 1455   CO2 28 05/17/2012 1455   BUN 10 05/17/2012 1455   CREATININE 0.79 05/17/2012 1455   CREATININE 0.95 02/27/2011 0850      Component Value Date/Time   CALCIUM 9.2 05/17/2012 1455   ALKPHOS 123* 05/17/2012 1455   AST 16 05/17/2012 1455   ALT 9 05/17/2012 1455   BILITOT 0.2* 05/17/2012 1455      Results for Charles Charles [name] (MRN [id number]) as of 05/18/2012 09:39  Ref. Range 05/17/2012 14:55  LDH Latest Range: 94-250 U/L 216   Results for Charles Charles [name] (MRN  [id number]) as of 05/18/2012 09:39  Ref. Range 05/17/2012 14:55  Sed Rate Latest Range: 0-16 mm/hr 42 (H)      ASSESSMENT:  1. Recent pneumonia, second occurrence.  The first was in November 2013 and this most recent one is Feb 2014.  Both treated with antibiotics.  He is now on Charles [name] for 12 days for WBC support.  2. 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 He is without B. symptomatology. Presently he feels very well. He was treated with 6 cycles of bendamustine and Rituxan with a CR on PET scan after 3 cycles and completed Bendamustine + Rituxan on  11/05/2010.  He is S/P Rituxan maintenance starting 02/17/2011 and last treated on 12/09/2011.  This was D/C'd due to  pneumonia infection and has not received any chemotherapy since.  3.COPD, quit smoking for good finally.   4. CHF in December 2010 presented with atrial fibrillation at that time  5. Chronic anxiety on Xanax 4 times a day  6. Septic Shock/Pseudomonas pneumonia and bacteremia requiring ICU admission at [facility] and discharged on 02/04/12.  Recurrenct of CAP in the setting of neutropenia requiring hospitalization with a discharge date of 05/11/2012.    PLAN:  1. I personally reviewed and went over laboratory results with the patient. 2. Referral to Infectious Disease at the [facility] clinic in March 3. Lab work in 6 weeks: CBC diff, CMET, LDH, ESR 4. Continue Charles [name] 480 mcg SQ daily x 12 days.  5. Congratulated the patient on quitting his smoking habit and asked him to refrain from going back to tobacco abuse.  6. Return in 6 weeks for follow-up.    All questions were answered. The patient knows to call the clinic with any problems, questions or concerns. We can certainly see the patient much sooner if necessary.  The patient and plan discussed with Glenford Peers, MD and he is in agreement with the  aforementioned.  Pura Picinich

## 2012-05-19 ENCOUNTER — Encounter (HOSPITAL_BASED_OUTPATIENT_CLINIC_OR_DEPARTMENT_OTHER): Payer: Medicare Other

## 2012-05-19 DIAGNOSIS — D709 Neutropenia, unspecified: Secondary | ICD-10-CM

## 2012-05-19 MED ORDER — FILGRASTIM 480 MCG/1.6ML IJ SOLN
480.0000 ug | Freq: Once | INTRAMUSCULAR | Status: AC
  Administered 2012-05-19: 480 ug via SUBCUTANEOUS
  Filled 2012-05-19: qty 1.6

## 2012-05-19 MED ORDER — PEGFILGRASTIM INJECTION 6 MG/0.6ML
SUBCUTANEOUS | Status: AC
  Filled 2012-05-19: qty 0.6

## 2012-05-19 NOTE — Progress Notes (Signed)
Charles [name] presents today for injection per the provider's orders.  Neupogen administered administration without incident; see MAR for injection details.  Patient tolerated procedure well and without incident.  No questions or complaints noted at this time.

## 2012-05-20 ENCOUNTER — Encounter (HOSPITAL_BASED_OUTPATIENT_CLINIC_OR_DEPARTMENT_OTHER): Payer: Medicare Other

## 2012-05-20 DIAGNOSIS — D709 Neutropenia, unspecified: Secondary | ICD-10-CM

## 2012-05-20 MED ORDER — FILGRASTIM 480 MCG/0.8ML IJ SOLN
480.0000 ug | Freq: Once | INTRAMUSCULAR | Status: AC
  Administered 2012-05-20: 480 ug via SUBCUTANEOUS

## 2012-05-20 NOTE — Progress Notes (Signed)
Charles [name] presents today for injection per MD orders. Neupogen 480 mcg administered SQ in left Abdomen. Administration without incident. Patient tolerated well.  

## 2012-05-21 ENCOUNTER — Encounter (HOSPITAL_BASED_OUTPATIENT_CLINIC_OR_DEPARTMENT_OTHER): Payer: Medicare Other

## 2012-05-21 DIAGNOSIS — D709 Neutropenia, unspecified: Secondary | ICD-10-CM

## 2012-05-21 MED ORDER — FILGRASTIM 480 MCG/1.6ML IJ SOLN
480.0000 ug | Freq: Once | INTRAMUSCULAR | Status: AC
  Administered 2012-05-21: 480 ug via SUBCUTANEOUS
  Filled 2012-05-21: qty 1.6

## 2012-05-21 MED FILL — Filgrastim Inj 480 MCG/1.6ML (300 MCG/ML): INTRAMUSCULAR | Qty: 1.6 | Status: AC

## 2012-05-21 NOTE — Progress Notes (Signed)
Charles [name] presents today for injection per MD orders. Neupogen 480 mcg administered SQ in left Abdomen. Administration without incident. Patient tolerated well.  

## 2012-05-22 ENCOUNTER — Encounter (HOSPITAL_COMMUNITY): Payer: Medicare Other | Attending: Oncology

## 2012-05-22 DIAGNOSIS — Z5189 Encounter for other specified aftercare: Secondary | ICD-10-CM

## 2012-05-22 DIAGNOSIS — Z8701 Personal history of pneumonia (recurrent): Secondary | ICD-10-CM

## 2012-05-22 DIAGNOSIS — C8299 Follicular lymphoma, unspecified, extranodal and solid organ sites: Secondary | ICD-10-CM

## 2012-05-22 DIAGNOSIS — D649 Anemia, unspecified: Secondary | ICD-10-CM | POA: Insufficient documentation

## 2012-05-22 MED ORDER — FILGRASTIM 480 MCG/1.6ML IJ SOLN
480.0000 ug | Freq: Once | INTRAMUSCULAR | Status: DC
  Filled 2012-05-22: qty 1.6

## 2012-05-22 MED ORDER — FILGRASTIM 480 MCG/0.8ML IJ SOLN
480.0000 ug | Freq: Once | INTRAMUSCULAR | Status: DC
  Administered 2012-05-22: 480 ug via SUBCUTANEOUS

## 2012-05-22 MED FILL — Filgrastim Inj 480 MCG/1.6ML (300 MCG/ML): INTRAMUSCULAR | Qty: 1.6 | Status: AC

## 2012-05-22 NOTE — Addendum Note (Signed)
Addended by: Valrie Hart A on: 05/22/2012 09:36 AM   Modules accepted: Orders

## 2012-05-22 NOTE — Progress Notes (Signed)
Charles [name] presents today for injection per MD orders. Neupogen 480 mcg administered SQ in right Abdomen. Administration without incident. Patient tolerated well.  

## 2012-05-23 ENCOUNTER — Encounter (HOSPITAL_BASED_OUTPATIENT_CLINIC_OR_DEPARTMENT_OTHER): Payer: Medicare Other

## 2012-05-23 DIAGNOSIS — Z8701 Personal history of pneumonia (recurrent): Secondary | ICD-10-CM

## 2012-05-23 DIAGNOSIS — C8299 Follicular lymphoma, unspecified, extranodal and solid organ sites: Secondary | ICD-10-CM

## 2012-05-23 DIAGNOSIS — Z5189 Encounter for other specified aftercare: Secondary | ICD-10-CM

## 2012-05-23 MED ORDER — FILGRASTIM 480 MCG/1.6ML IJ SOLN
480.0000 ug | Freq: Once | INTRAMUSCULAR | Status: DC
  Filled 2012-05-23: qty 1.6

## 2012-05-23 MED ORDER — FILGRASTIM 480 MCG/0.8ML IJ SOLN
480.0000 ug | Freq: Once | INTRAMUSCULAR | Status: DC
  Administered 2012-05-23: 480 ug via SUBCUTANEOUS

## 2012-05-23 MED FILL — Filgrastim Inj 480 MCG/1.6ML (300 MCG/ML): INTRAMUSCULAR | Qty: 1.6 | Status: AC

## 2012-05-23 NOTE — Progress Notes (Signed)
Charles [name] presents today for injection per MD orders. Neupogen 480 mcg administered SQ in right Abdomen. Administration without incident. Patient tolerated well.  

## 2012-05-23 NOTE — Addendum Note (Signed)
Addended by: Valrie Hart A on: 05/23/2012 09:47 AM   Modules accepted: Orders

## 2012-05-31 ENCOUNTER — Ambulatory Visit (INDEPENDENT_AMBULATORY_CARE_PROVIDER_SITE_OTHER): Payer: Medicare Other | Admitting: *Deleted

## 2012-05-31 ENCOUNTER — Ambulatory Visit (HOSPITAL_COMMUNITY)
Admission: RE | Admit: 2012-05-31 | Discharge: 2012-05-31 | Disposition: A | Discharge status: Home / Self Care | Payer: Medicare Other | Source: Ambulatory Visit | Attending: Infectious Disease | Admitting: Infectious Disease

## 2012-05-31 ENCOUNTER — Encounter: Payer: Self-pay | Admitting: Infectious Disease

## 2012-05-31 ENCOUNTER — Ambulatory Visit (INDEPENDENT_AMBULATORY_CARE_PROVIDER_SITE_OTHER): Payer: Medicare Other | Admitting: Infectious Disease

## 2012-05-31 VITALS — BP 123/77 | HR 86 | Temp 98.5°F | Wt 192.0 lb

## 2012-05-31 DIAGNOSIS — J189 Pneumonia, unspecified organism: Secondary | ICD-10-CM

## 2012-05-31 DIAGNOSIS — R7881 Bacteremia: Secondary | ICD-10-CM

## 2012-05-31 DIAGNOSIS — Z7901 Long term (current) use of anticoagulants: Secondary | ICD-10-CM

## 2012-05-31 DIAGNOSIS — B965 Pseudomonas (aeruginosa) (mallei) (pseudomallei) as the cause of diseases classified elsewhere: Secondary | ICD-10-CM

## 2012-05-31 DIAGNOSIS — I4891 Unspecified atrial fibrillation: Secondary | ICD-10-CM

## 2012-05-31 DIAGNOSIS — D709 Neutropenia, unspecified: Secondary | ICD-10-CM

## 2012-05-31 DIAGNOSIS — J90 Pleural effusion, not elsewhere classified: Secondary | ICD-10-CM | POA: Insufficient documentation

## 2012-05-31 DIAGNOSIS — R5381 Other malaise: Secondary | ICD-10-CM

## 2012-05-31 DIAGNOSIS — C8298 Follicular lymphoma, unspecified, lymph nodes of multiple sites: Secondary | ICD-10-CM

## 2012-05-31 DIAGNOSIS — R0601 Orthopnea: Secondary | ICD-10-CM | POA: Insufficient documentation

## 2012-05-31 LAB — POCT INR: INR: 1.6

## 2012-05-31 NOTE — Progress Notes (Signed)
Subjective:    Patient ID: Charles [name], male    DOB: [birth year], 72 y.o.   MRN: [id number]  HPI  72 year old man with follicular lymphoma stage IV grade 3 sp 6 cycles of bendamustine and Rituxan with a CR on PET scan after 3 cycles and completed Bendamustine + Rituxan on 11/05/2010. He is S/P Rituxan maintenance starting 02/17/2011 and last treated on 12/09/2011. After the rx with rituxan in September he had developed PNA AND Pseudomonas bacteremia that was managed with MORE than 2 weeks of effective abx (he finished with high dose cipro 750bid). He had been found to be neutropenic at admission in November and apparently did receive neupogen as an inpatient during that stay. In followup in January the patient had a CT scan which showed  L>>R reticulonodular opacities thought to be pneumonia. Dr. Arnell Asal notes indicate that the patient was again on two different abx from PCP one of which was doxycyline at the time and he encouraged pt to continue these. Pts ANC during that visit had dropped again to 400 and he was given neupogen injections once agaiin x 7 days. In early February (14th)  he was once again admitted to the hospital with HCAP pleural effusion and Neutropenic fevers. He was broadly covered with vancomycin, cefepime and levaquin which he received for 4 days before being changed over to levaquin by itself which he received for an additional 7 days (11 days of therapy). He once again received injections of neupogen and has felt much better with last dose on 05/23/12 and normalizaiton of his WBC and his ANC.   He DOES have continued significant multipillow orthopnea with poor sleep at night and falling asleep frequently during the day.   He was referred to [place] for workup of his recurrent infections.   I have reviewed all of his pertinent, medical surgical, family and social history in the chart. We again went into detail of his travel hx and he has largely lived in [place] apart from one trip tot he  [place] 2 years ago has had no travel abroad. No hx of exposure to TB.   I spent greater than 60 minutes with the patient including greater than 50% of time in face to face counsel of the patient and in coordination of their care.     Review of Systems  Constitutional: Negative for fever, chills, diaphoresis, activity change, appetite change, fatigue and unexpected weight change.  HENT: Negative for congestion, sore throat, rhinorrhea, sneezing, trouble swallowing and sinus pressure.   Eyes: Negative for photophobia and visual disturbance.  Respiratory: Positive for shortness of breath. Negative for cough, chest tightness, wheezing and stridor.   Cardiovascular: Negative for chest pain, palpitations and leg swelling.  Gastrointestinal: Negative for nausea, vomiting, abdominal pain, diarrhea, constipation, blood in stool, abdominal distention and anal bleeding.  Genitourinary: Negative for dysuria, hematuria, flank pain and difficulty urinating.  Musculoskeletal: Negative for myalgias, back pain, joint swelling, arthralgias and gait problem.  Skin: Negative for color change, pallor, rash and wound.  Neurological: Negative for dizziness, tremors, weakness and light-headedness.  Hematological: Negative for adenopathy. Does not bruise/bleed easily.  Psychiatric/Behavioral: Positive for sleep disturbance. Negative for behavioral problems, confusion, dysphoric mood, decreased concentration and agitation.       Objective:   Physical Exam  Constitutional: He is oriented to person, place, and time. He appears well-developed and well-nourished. No distress.  HENT:  Head: Normocephalic and atraumatic.  Mouth/Throat: Oropharynx is clear and moist. No oropharyngeal exudate.  Eyes: Conjunctivae and EOM are normal. Pupils are equal, round, and reactive to light. No scleral icterus.  Neck: Normal range of motion. Neck supple. No JVD present.  Cardiovascular: Normal rate, regular rhythm and normal  heart sounds.  Exam reveals no gallop and no friction rub.   No murmur heard. Pulmonary/Chest: Effort normal and breath sounds normal. No respiratory distress. He has no wheezes. He has no rales. He exhibits no tenderness.  Abdominal: He exhibits no distension and no mass. There is no tenderness. There is no rebound and no guarding.  Musculoskeletal: He exhibits no edema and no tenderness.  Lymphadenopathy:    He has no cervical adenopathy.  Neurological: He is alert and oriented to person, place, and time. He exhibits normal muscle tone. Coordination normal.  Skin: Skin is warm and dry. He is not diaphoretic. No erythema. No pallor.     Psychiatric: He has a normal mood and affect. His behavior is normal. Judgment and thought content normal.          Assessment & Plan:   Recurrent infections including 3 PNA's and one concommitant Pseudomonal bacteremia since this fall:  --The OBVIOUS culprit for his recurrent infections is his persistent NEUTROPENIA. Looking back through epic records it does not appear that this wBC abnormality had shown up until he had been dx with his malignancy and given chemotherapy. HOwever he has been without chemo since 11/2011 but still becomes reliably neutropenic. I think he needs to have the cause of his neutropenia elucidated and treated. COuld his lymphoma have returned? Could he have an infection with MTB or dimorphic fungus in marrow. Could have have irreversilbe or reversiable drug toxicity to marrow?   --If it is an untreatable or irreversible cause then he will likely indeed need lifelong support with neuopen.  --I will recheck his labs today but think it might be helpful to proceed with a bone marrow biopsy for path exam and culture for AFB, fungal and bacterial organisms. I will defer this part of workup to Dr. Mariel Sleet. I have reinforced concept of neutropenic precautions to him  --I have also emphasized to him that he should have repeat labs done  within the next 2 weeks if labs are normal today   Persistent neutropenia, leukopenia: -- see above. Again does he need a bone marrow biopsy to elucidate cause of this ?  Pseudomonal bacteremia: likely was due to HCAP at that time. If recurs again would worry more about porta cath   Orthopnea: could be related to persistent pleural effusions, or and/ordue to pulmonary edema given worsenign of ssx off of lasix. -- I will check cxr 2 view, lat decub film and pro BNP --if his CXR fails to show evidence for pna or effusion that is subtantially unilateral then I think he would benefit from diuresis and would want him to followup with his PCP Dr. [name] re redosing of his lasix  Fatigue: multifactorial but likely hihgly related to poor sleep in contex of orthopenea  ==

## 2012-05-31 NOTE — Patient Instructions (Signed)
We will get blood work today and chest xrays  I need to talk to Dr. Mariel Sleet re why your WBC and Neutrophils keep dropping and setting you up for infection  Your night time symptoms worry me for either:  Pleural effusion (fluid aroudn the lung) due to heart failure or your recent pneumonia  Or  Pulmonary edema (fluid within the lung) due to heart failure  You may need to go back on lasix to help with this problem  It is also possible we may need to arrange to have this fluid around the lung sampled.

## 2012-06-04 ENCOUNTER — Other Ambulatory Visit (HOSPITAL_COMMUNITY): Payer: Self-pay | Admitting: Oncology

## 2012-06-04 DIAGNOSIS — C8290 Follicular lymphoma, unspecified, unspecified site: Secondary | ICD-10-CM

## 2012-06-07 ENCOUNTER — Ambulatory Visit (INDEPENDENT_AMBULATORY_CARE_PROVIDER_SITE_OTHER): Payer: Medicare Other | Admitting: *Deleted

## 2012-06-07 DIAGNOSIS — I4891 Unspecified atrial fibrillation: Secondary | ICD-10-CM

## 2012-06-07 DIAGNOSIS — Z7901 Long term (current) use of anticoagulants: Secondary | ICD-10-CM

## 2012-06-07 LAB — POCT INR: INR: 2.7

## 2012-06-15 ENCOUNTER — Ambulatory Visit (HOSPITAL_COMMUNITY)
Admission: RE | Admit: 2012-06-15 | Discharge: 2012-06-15 | Disposition: A | Discharge status: Home / Self Care | Payer: Medicare Other | Source: Ambulatory Visit | Attending: Family Medicine | Admitting: Family Medicine

## 2012-06-15 ENCOUNTER — Other Ambulatory Visit (HOSPITAL_COMMUNITY): Payer: Self-pay | Admitting: Family Medicine

## 2012-06-15 DIAGNOSIS — Z09 Encounter for follow-up examination after completed treatment for conditions other than malignant neoplasm: Secondary | ICD-10-CM | POA: Insufficient documentation

## 2012-06-15 DIAGNOSIS — J189 Pneumonia, unspecified organism: Secondary | ICD-10-CM | POA: Insufficient documentation

## 2012-06-21 ENCOUNTER — Encounter (HOSPITAL_COMMUNITY)
Admission: RE | Admit: 2012-06-21 | Discharge: 2012-06-21 | Disposition: A | Discharge status: Home / Self Care | Payer: Medicare Other | Source: Ambulatory Visit | Attending: Oncology | Admitting: Oncology

## 2012-06-21 DIAGNOSIS — I700 Atherosclerosis of aorta: Secondary | ICD-10-CM | POA: Insufficient documentation

## 2012-06-21 DIAGNOSIS — K5730 Diverticulosis of large intestine without perforation or abscess without bleeding: Secondary | ICD-10-CM | POA: Insufficient documentation

## 2012-06-21 DIAGNOSIS — C8589 Other specified types of non-Hodgkin lymphoma, extranodal and solid organ sites: Secondary | ICD-10-CM | POA: Insufficient documentation

## 2012-06-21 DIAGNOSIS — C8290 Follicular lymphoma, unspecified, unspecified site: Secondary | ICD-10-CM

## 2012-06-21 DIAGNOSIS — I517 Cardiomegaly: Secondary | ICD-10-CM | POA: Insufficient documentation

## 2012-06-21 MED ORDER — FLUDEOXYGLUCOSE F - 18 (FDG) INJECTION
18.5000 | Freq: Once | INTRAVENOUS | Status: AC | PRN
  Administered 2012-06-21: 18.5 via INTRAVENOUS

## 2012-06-22 ENCOUNTER — Telehealth (HOSPITAL_COMMUNITY): Payer: Self-pay

## 2012-06-22 ENCOUNTER — Other Ambulatory Visit (HOSPITAL_COMMUNITY): Payer: Self-pay

## 2012-06-22 HISTORY — PX: BONE MARROW ASPIRATION: SHX1252

## 2012-06-22 HISTORY — PX: BONE MARROW BIOPSY: SHX199

## 2012-06-22 NOTE — Telephone Encounter (Signed)
Message copied by Evelena Leyden on Tue Jun 22, 2012  6:03 PM ------      Message from: Mariel Sleet, ERIC S      Created: Tue Jun 22, 2012  9:53 AM       Pet scan negative for lymphoma.      Any coughing up of phlegm      ? Pneumonia left lung(small patch) ------

## 2012-06-22 NOTE — Telephone Encounter (Signed)
Per instructions of Dr. Mariel Sleet, patient is to have his temperature taken tonight and again tomorrow morning prior to coming here for bone marrow biopsy and to record results and bring in with him in the am.  Wife verbalized understanding of instructions.

## 2012-06-23 ENCOUNTER — Encounter (HOSPITAL_COMMUNITY): Payer: Medicare Other | Attending: Oncology | Admitting: Oncology

## 2012-06-23 VITALS — BP 102/70 | HR 81 | Temp 97.3°F | Resp 16

## 2012-06-23 DIAGNOSIS — C8290 Follicular lymphoma, unspecified, unspecified site: Secondary | ICD-10-CM

## 2012-06-23 DIAGNOSIS — C8299 Follicular lymphoma, unspecified, extranodal and solid organ sites: Secondary | ICD-10-CM | POA: Insufficient documentation

## 2012-06-23 DIAGNOSIS — D709 Neutropenia, unspecified: Secondary | ICD-10-CM | POA: Insufficient documentation

## 2012-06-23 DIAGNOSIS — J189 Pneumonia, unspecified organism: Secondary | ICD-10-CM | POA: Insufficient documentation

## 2012-06-23 LAB — CBC WITH DIFFERENTIAL/PLATELET
Basophils Relative: 5 % — ABNORMAL HIGH (ref 0–1)
Eosinophils Absolute: 0.6 10*3/uL (ref 0.0–0.7)
MCH: 27.8 pg (ref 26.0–34.0)
MCHC: 32.9 g/dL (ref 30.0–36.0)
Neutrophils Relative %: 10 % — ABNORMAL LOW (ref 43–77)
Platelets: 258 10*3/uL (ref 150–400)
RBC: 4.03 MIL/uL — ABNORMAL LOW (ref 4.22–5.81)

## 2012-06-23 LAB — COMPREHENSIVE METABOLIC PANEL
ALT: 9 U/L (ref 0–53)
Albumin: 3.3 g/dL — ABNORMAL LOW (ref 3.5–5.2)
Alkaline Phosphatase: 93 U/L (ref 39–117)
Potassium: 4.3 mEq/L (ref 3.5–5.1)
Sodium: 138 mEq/L (ref 135–145)
Total Protein: 7.6 g/dL (ref 6.0–8.3)

## 2012-06-23 MED ORDER — PEGFILGRASTIM INJECTION 6 MG/0.6ML
6.0000 mg | Freq: Once | SUBCUTANEOUS | Status: AC
  Administered 2012-06-23: 6 mg via SUBCUTANEOUS

## 2012-06-23 MED ORDER — PEGFILGRASTIM INJECTION 6 MG/0.6ML
SUBCUTANEOUS | Status: AC
  Filled 2012-06-23: qty 0.6

## 2012-06-23 MED ORDER — AMOXICILLIN-POT CLAVULANATE 875-125 MG PO TABS: ORAL_TABLET | ORAL | Status: DC

## 2012-06-23 NOTE — Progress Notes (Signed)
Procedure note: After informed consent the patient was placed in the prone position and the posterior superior iliac spinous processes were both identified the left was chosen. He was then cleansed with 3 Betadine swabs followed by the use of 10-11 cc of 2% plain Xylocaine for local anesthesia. Following that bone marrow aspiration was obtained for routine pathology as well as flow cytometry and cytogenetics. A bone marrow biopsy was then performed without incident. He was sent home in good condition.

## 2012-06-23 NOTE — Progress Notes (Signed)
[name] BONE MARROW BIOPSY/ASPIRATE PROGRESS NOTE  Charles [name] presents for Bone Marrow biopsy per MD orders. Charles [name] verbalized understanding of procedure. Xanax 0.5 mg po and Hydrocodone 10/325 po taken by patient at 7:45 am at home prior to arrival. Consent reviewed and signed.  Charles [name] positioned supine for procedure. Time-out performed and Bone Marrow Checklist. Procedure began at 0853. Xylocaine 2% 10 cc used for local and administered to patient by Dr. Mariel Sleet. Procedure completed at 0904. Patient tolerated well. Pressure dressing applied to the left hip with instructions to leave in place for 24 hours. Patient instructed to report any bleeding that saturates dressing and to take pain medication Hydrocodone  as directed. Dressing dry and intact to the left hip on discharge.      Charles [name] presents today for injection per MD orders. Neulasta 6mg  administered SQ in right Abdomen. Administration without incident. Patient tolerated well.

## 2012-06-23 NOTE — Patient Instructions (Addendum)
[facility] Discharge Instructions  RECOMMENDATIONS MADE BY THE CONSULTANT AND ANY TEST RESULTS WILL BE SENT TO YOUR REFERRING PHYSICIAN.  EXAM FINDINGS BY THE PHYSICIAN TODAY AND SIGNS OR SYMPTOMS TO REPORT TO CLINIC OR PRIMARY PHYSICIAN: You had a bone marrow biopsy and aspiration performed.  If bleeding occurs, apply direct pressure for 10 minutes.  If unable to stop bleeding after 10 minutes of direct pressure go to the ED.  Keep dressing clean, dry and intact for 24 hours.  MEDICATIONS PRESCRIBED:  Take your hydrocodone as needed for discomfort. Augmentin take 1 twice daily for 10 days. Neulasta will be given every 14 days     SPECIAL INSTRUCTIONS/FOLLOW-UP: Bone Density on 4/7 at [facility] and for follow-up on 07/06/12.  Thank you for choosing [facility] to provide your oncology and hematology care.  To afford each patient quality time with our providers, please arrive at least 15 minutes before your scheduled appointment time.  With your help, our goal is to use those 15 minutes to complete the necessary work-up to ensure our physicians have the information they need to help with your evaluation and healthcare recommendations.    Effective January 1st, 2014, we ask that you re-schedule your appointment with our physicians should you arrive 10 or more minutes late for your appointment.  We strive to give you quality time with our providers, and arriving late affects you and other patients whose appointments are after yours.    Again, thank you for choosing [facility].  Our hope is that these requests will decrease the amount of time that you wait before being seen by our physicians.       _____________________________________________________________  Should you have questions after your visit to [facility], please contact our office at [phone] between the hours of 8:30 a.m. and 5:00 p.m.   Voicemails left after 4:30 p.m. will not be returned until the following business day.  For prescription refill requests, have your pharmacy contact our office with your prescription refill request.     Bone Marrow Aspiration, Bone Marrow Biopsy Care After Read the instructions outlined below and refer to this sheet in the next few weeks. These discharge instructions provide you with general information on caring for yourself after you leave the hospital. Your caregiver may also give you specific instructions. While your treatment has been planned according to the most current medical practices available, unavoidable complications occasionally occur. If you have any problems or questions after discharge, call your caregiver. FINDING OUT THE RESULTS OF YOUR TEST Not all test results are available during your visit. If your test results are not back during the visit, make an appointment with your caregiver to find out the results. Do not assume everything is normal if you have not heard from your caregiver or the medical facility. It is important for you to follow up on all of your test results.  HOME CARE INSTRUCTIONS  You have had sedation and may be sleepy or dizzy. Your thinking may not be as clear as usual. For the next 24 hours:  Only take over-the-counter or prescription medicines for pain, discomfort, and or fever as directed by your caregiver.  Do not drink alcohol.  Do not smoke.  Do not drive.  Do not make important legal decisions.  Do not operate heavy machinery.  Do not care for small children by yourself.  Keep your dressing clean and dry. You may replace  dressing with a bandage after 24 hours.  You may take a bath or shower after 24 hours.  Use an ice pack for 20 minutes every 2 hours while awake for pain as needed. SEEK MEDICAL CARE IF:   There is redness, swelling, or increasing pain at the biopsy site.  There is pus coming from the biopsy site.  There is drainage  from a biopsy site lasting longer than one day.  An unexplained oral temperature above 102 F (38.9 C) develops. SEEK IMMEDIATE MEDICAL CARE IF:   You develop a rash.  You have difficulty breathing.  You develop any reaction or side effects to medications given. Document Released: 09/27/2004 Document Revised: 06/02/2011 Document Reviewed: 03/07/2008 [facility] Patient Information 2013 [place], [place].

## 2012-06-25 ENCOUNTER — Telehealth: Payer: Self-pay | Admitting: Cardiology

## 2012-06-25 NOTE — Telephone Encounter (Signed)
Please advise.  Patient states this is only a filling.

## 2012-06-25 NOTE — Telephone Encounter (Signed)
PT IS HAVING DENTAL PROCEDURE ON Tuesday AND NEEDS TO KNOW IF HE SHOULD STOP HIS COUMADIN

## 2012-06-27 NOTE — Telephone Encounter (Signed)
No need to stop warfarin.

## 2012-06-28 ENCOUNTER — Ambulatory Visit (INDEPENDENT_AMBULATORY_CARE_PROVIDER_SITE_OTHER): Payer: Medicare Other | Admitting: *Deleted

## 2012-06-28 ENCOUNTER — Other Ambulatory Visit (HOSPITAL_COMMUNITY): Payer: Self-pay | Admitting: Oncology

## 2012-06-28 ENCOUNTER — Ambulatory Visit (HOSPITAL_COMMUNITY)
Admission: RE | Admit: 2012-06-28 | Discharge: 2012-06-28 | Disposition: A | Discharge status: Home / Self Care | Payer: Medicare Other | Source: Ambulatory Visit | Attending: Oncology | Admitting: Oncology

## 2012-06-28 ENCOUNTER — Other Ambulatory Visit (HOSPITAL_COMMUNITY): Payer: Self-pay | Admitting: *Deleted

## 2012-06-28 ENCOUNTER — Other Ambulatory Visit (HOSPITAL_COMMUNITY): Payer: Medicare Other

## 2012-06-28 DIAGNOSIS — M899 Disorder of bone, unspecified: Secondary | ICD-10-CM | POA: Insufficient documentation

## 2012-06-28 DIAGNOSIS — C8290 Follicular lymphoma, unspecified, unspecified site: Secondary | ICD-10-CM

## 2012-06-28 DIAGNOSIS — Z7901 Long term (current) use of anticoagulants: Secondary | ICD-10-CM

## 2012-06-28 DIAGNOSIS — Z13820 Encounter for screening for osteoporosis: Secondary | ICD-10-CM | POA: Insufficient documentation

## 2012-06-28 DIAGNOSIS — I4891 Unspecified atrial fibrillation: Secondary | ICD-10-CM

## 2012-06-28 DIAGNOSIS — M949 Disorder of cartilage, unspecified: Secondary | ICD-10-CM | POA: Insufficient documentation

## 2012-06-28 DIAGNOSIS — J189 Pneumonia, unspecified organism: Secondary | ICD-10-CM

## 2012-06-28 LAB — POCT INR: INR: 2.1

## 2012-06-28 NOTE — Telephone Encounter (Signed)
Patient made aware and verbalized understanding.

## 2012-06-29 ENCOUNTER — Ambulatory Visit (HOSPITAL_COMMUNITY): Payer: Medicare Other

## 2012-06-29 ENCOUNTER — Telehealth: Payer: Self-pay | Admitting: *Deleted

## 2012-06-29 ENCOUNTER — Other Ambulatory Visit (HOSPITAL_COMMUNITY): Payer: Medicare Other

## 2012-06-29 ENCOUNTER — Encounter (HOSPITAL_BASED_OUTPATIENT_CLINIC_OR_DEPARTMENT_OTHER): Payer: Medicare Other

## 2012-06-29 ENCOUNTER — Ambulatory Visit (HOSPITAL_COMMUNITY): Payer: Medicare Other | Admitting: Oncology

## 2012-06-29 VITALS — BP 116/77 | HR 94

## 2012-06-29 DIAGNOSIS — M899 Disorder of bone, unspecified: Secondary | ICD-10-CM

## 2012-06-29 DIAGNOSIS — M949 Disorder of cartilage, unspecified: Secondary | ICD-10-CM

## 2012-06-29 DIAGNOSIS — J189 Pneumonia, unspecified organism: Secondary | ICD-10-CM

## 2012-06-29 DIAGNOSIS — C8290 Follicular lymphoma, unspecified, unspecified site: Secondary | ICD-10-CM

## 2012-06-29 DIAGNOSIS — D709 Neutropenia, unspecified: Secondary | ICD-10-CM

## 2012-06-29 LAB — FOLATE: Folate: >20 ng/mL

## 2012-06-29 LAB — VITAMIN B12: Vitamin B-12: 1423 pg/mL — ABNORMAL HIGH (ref 211–911)

## 2012-06-29 MED ORDER — DENOSUMAB 120 MG/1.7ML ~~LOC~~ SOLN
120.0000 mg | Freq: Once | SUBCUTANEOUS | Status: AC
  Administered 2012-06-29: 120 mg via SUBCUTANEOUS
  Filled 2012-06-29: qty 1.7

## 2012-06-29 NOTE — Progress Notes (Signed)
Received xgeva 120 mg sub-q to lower right abd.  Tolerated well. Marland Kitchen  Marland Kitchen

## 2012-06-29 NOTE — Telephone Encounter (Signed)
OK for pt to hold coumadin 3 days for dental extraction?  Please advise.

## 2012-06-29 NOTE — Progress Notes (Signed)
Labs drawn today for Folate,B12,Immunofixation ,Protein electrophoresis

## 2012-06-29 NOTE — Telephone Encounter (Signed)
Patient is needing to get tooth extracted.  Needs letter stating when to come off and go back on Coumadin prior to and post extraction.  Please fax instructions to [phone] [facility]). tgs

## 2012-06-30 ENCOUNTER — Other Ambulatory Visit (HOSPITAL_COMMUNITY): Payer: Self-pay | Admitting: Oncology

## 2012-06-30 NOTE — Telephone Encounter (Signed)
Dr [facility] It is OK for pt to hold coumadin 3 days before dental extraction and resume coumadin at current dose night of extraction per Dr Dietrich Pates. Vashti Hey RN

## 2012-06-30 NOTE — Telephone Encounter (Signed)
OK to interrupt warfarin for dental extraction.

## 2012-07-01 LAB — PROTEIN ELECTROPHORESIS, SERUM
Albumin ELP: 51.4 % — ABNORMAL LOW (ref 55.8–66.1)
Alpha-1-Globulin: 5.4 % — ABNORMAL HIGH (ref 2.9–4.9)
Beta 2: 6.4 % (ref 3.2–6.5)
Beta Globulin: 6.7 % (ref 4.7–7.2)

## 2012-07-01 LAB — CHROMOSOME ANALYSIS, BONE MARROW

## 2012-07-01 LAB — TISSUE HYBRIDIZATION (BONE MARROW)-NCBH

## 2012-07-01 LAB — IMMUNOFIXATION ELECTROPHORESIS: IgM, Serum: 90 mg/dL (ref 41–251)

## 2012-07-05 ENCOUNTER — Encounter: Payer: Self-pay | Admitting: Infectious Diseases

## 2012-07-05 ENCOUNTER — Ambulatory Visit (HOSPITAL_COMMUNITY): Payer: Medicare Other | Admitting: Oncology

## 2012-07-05 ENCOUNTER — Ambulatory Visit (INDEPENDENT_AMBULATORY_CARE_PROVIDER_SITE_OTHER): Payer: Medicare Other | Admitting: Infectious Diseases

## 2012-07-05 VITALS — BP 118/72 | HR 79 | Temp 97.7°F | Wt 198.0 lb

## 2012-07-05 DIAGNOSIS — D709 Neutropenia, unspecified: Secondary | ICD-10-CM

## 2012-07-05 DIAGNOSIS — B372 Candidiasis of skin and nail: Secondary | ICD-10-CM | POA: Insufficient documentation

## 2012-07-05 MED ORDER — MICONAZOLE NITRATE 2 % EX CREA: TOPICAL_CREAM | Freq: Two times a day (BID) | CUTANEOUS | Status: DC

## 2012-07-05 NOTE — Assessment & Plan Note (Addendum)
Explained to him that this is due to his anbx/CTX/neutropenia. Will like to give him a short course of diflucan but has significant drug interactions (Coumadin, cardizem, statin). Will give him topical instead for now. I discussed this with pharmacy at [facility] and they felt that giving him coumadin would be ok given need for increased INR monitoring. Will reserve this for use if miconazole fails.

## 2012-07-05 NOTE — Assessment & Plan Note (Addendum)
The cause of his neutropenia (and recurrent pneumonias) is unclear (marrow failure after CTX, ADR to xgeva). He continues to get xgeva every 28 days, neulastin every 14 days. He has f/u with his H/O MD to complete this w/u.  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 He will need re-imaging of his chest and will f/u after that. I spoke to him and his wife that if this does not improve could consider a longer course of anbx, he would not be a surgical candidate. Would question if not improved if BAL would be helpful.  Will see him back in 4 weeks with CXR prior. Re-iterated neutropenic precautions to him and his wife.

## 2012-07-05 NOTE — Progress Notes (Signed)
  Subjective:    Patient ID: Charles [name], male    DOB: [birth year], 72 y.o.   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 He is S/P Rituxan maintenance starting 02/17/2011 and last treated on 12/09/2011. After the rx with rituxan in September he had developed PNA and Pseudomonas bacteremia that was managed with >2 weeks of effective abx (cipro 750bid). He had been found to be neutropenic at admission in November and apparently did received neupogen as an inpatient during that stay. In followup in January the patient had a CT scan which showed L>>R reticulonodular opacities thought to be pneumonia. Dr. Arnell Asal notes indicate that the patient was again on two different abx from PCP one of which was doxycyline at the time and he encouraged pt to continue these. Pts ANC during that visit had dropped again to 400 and he was given neupogen injections once again x 7 days. In early February (14th) he was once again admitted to the hospital with HCAP pleural effusion and Neutropenic fevers. He was broadly covered with vancomycin, cefepime and levaquin which he received for 4 days before being changed over to levaquin by itself which he received for an additional 7 days. He once again received injections of neupogen and has felt much better with last dose on 05/23/12 and normalizaiton of his WBC and his ANC.    He was eval 05-31-12 for workup of his recurrent infections in ID. He also had a BMBx on 06-23-12 to evaluate him for the cause of his persistent neutropenia: normocellular, neutropenia, plasmacytosis. He had Ig levels checked as well (normal or high). He had a PET scan that was normal as well. Last WBC 1.9 (ANC 200) on 06-23-12.    Since his last visit he has noted a rash on his forearms as well as his groin. He has been applying polysporin to this.    He continues to have a productive cough (white sputum). This improves when he uses his inhaler. No change in volume. He has also noted sinus congestion which improves with inhaler use.  No fever or chills.    Review of Systems See HPI    Objective:   Physical Exam  Constitutional: He appears well-developed and well-nourished.  HENT:  Mouth/Throat: Mucous membranes are dry. He has dentures. No oropharyngeal exudate.  Neck: Neck supple.  Cardiovascular: Normal rate, regular rhythm and normal heart sounds.   Pulmonary/Chest:    Abdominal: Soft. Bowel sounds are normal. He exhibits no distension.  Lymphadenopathy:    He has no cervical adenopathy.    He has no axillary adenopathy.       Right: No supraclavicular adenopathy present.       Left: No supraclavicular adenopathy present.  Skin:             Assessment & Plan:

## 2012-07-06 ENCOUNTER — Encounter (HOSPITAL_BASED_OUTPATIENT_CLINIC_OR_DEPARTMENT_OTHER): Payer: Medicare Other | Admitting: Oncology

## 2012-07-06 ENCOUNTER — Ambulatory Visit (HOSPITAL_COMMUNITY): Payer: Medicare Other | Admitting: Oncology

## 2012-07-06 ENCOUNTER — Encounter (HOSPITAL_COMMUNITY): Payer: Self-pay | Admitting: Oncology

## 2012-07-06 VITALS — BP 143/78 | HR 60 | Temp 98.4°F | Resp 18 | Wt 199.2 lb

## 2012-07-06 DIAGNOSIS — D709 Neutropenia, unspecified: Secondary | ICD-10-CM

## 2012-07-06 DIAGNOSIS — C8290 Follicular lymphoma, unspecified, unspecified site: Secondary | ICD-10-CM

## 2012-07-06 DIAGNOSIS — J189 Pneumonia, unspecified organism: Secondary | ICD-10-CM

## 2012-07-06 DIAGNOSIS — C8299 Follicular lymphoma, unspecified, extranodal and solid organ sites: Secondary | ICD-10-CM

## 2012-07-06 LAB — CBC WITH DIFFERENTIAL/PLATELET
Basophils Relative: 1 % (ref 0–1)
HCT: 34.8 % — ABNORMAL LOW (ref 39.0–52.0)
Hemoglobin: 11.5 g/dL — ABNORMAL LOW (ref 13.0–17.0)
Lymphs Abs: 0.6 10*3/uL — ABNORMAL LOW (ref 0.7–4.0)
MCHC: 33 g/dL (ref 30.0–36.0)
Monocytes Absolute: 0.3 10*3/uL (ref 0.1–1.0)
Monocytes Relative: 8 % (ref 3–12)
Neutro Abs: 2.9 10*3/uL (ref 1.7–7.7)

## 2012-07-06 LAB — COMPREHENSIVE METABOLIC PANEL
Albumin: 3.5 g/dL (ref 3.5–5.2)
BUN: 11 mg/dL (ref 6–23)
CO2: 25 mEq/L (ref 19–32)
Chloride: 104 mEq/L (ref 96–112)
Creatinine, Ser: 0.75 mg/dL (ref 0.50–1.35)
GFR calc Af Amer: >90 mL/min (ref >90)
GFR calc non Af Amer: 89 mL/min — ABNORMAL LOW (ref >90)
Glucose, Bld: 89 mg/dL (ref 70–99)
Total Bilirubin: 0.2 mg/dL — ABNORMAL LOW (ref 0.3–1.2)

## 2012-07-06 LAB — LACTATE DEHYDROGENASE: LDH: 152 U/L (ref 94–250)

## 2012-07-06 MED ORDER — PEGFILGRASTIM INJECTION 6 MG/0.6ML
6.0000 mg | Freq: Once | SUBCUTANEOUS | Status: AC
  Administered 2012-07-06: 6 mg via SUBCUTANEOUS

## 2012-07-06 MED ORDER — PEGFILGRASTIM INJECTION 6 MG/0.6ML
SUBCUTANEOUS | Status: AC
  Filled 2012-07-06: qty 0.6

## 2012-07-06 NOTE — Progress Notes (Signed)
Charles [name] presents today for injection per MD orders. Neulasta 6mg  administered SQ in right Abdomen. Administration without incident. Patient tolerated well.

## 2012-07-06 NOTE — Patient Instructions (Addendum)
.  [facility] Discharge Instructions  RECOMMENDATIONS MADE BY THE CONSULTANT AND ANY TEST RESULTS WILL BE SENT TO YOUR REFERRING PHYSICIAN.  EXAM FINDINGS BY THE PHYSICIAN TODAY AND SIGNS OR SYMPTOMS TO REPORT TO CLINIC OR PRIMARY PHYSICIAN:  We will do neulasta every 2 weeks x 3.  INSTRUCTIONS GIVEN AND DISCUSSED: Labs today and every 2 weeks  SPECIAL INSTRUCTIONS/FOLLOW-UP: 3 months to see Dr. Mariel Sleet Consider referral to infectious disease  Thank you for choosing [facility] to provide your oncology and hematology care.  To afford each patient quality time with our providers, please arrive at least 15 minutes before your scheduled appointment time.  With your help, our goal is to use those 15 minutes to complete the necessary work-up to ensure our physicians have the information they need to help with your evaluation and healthcare recommendations.    Effective January 1st, 2014, we ask that you re-schedule your appointment with our physicians should you arrive 10 or more minutes late for your appointment.  We strive to give you quality time with our providers, and arriving late affects you and other patients whose appointments are after yours.    Again, thank you for choosing [facility].  Our hope is that these requests will decrease the amount of time that you wait before being seen by our physicians.       _____________________________________________________________  Should you have questions after your visit to [facility], please contact our office at [phone] between the hours of 8:30 a.m. and 5:00 p.m.  Voicemails left after 4:30 p.m. will not be returned until the following business day.  For prescription refill requests, have your pharmacy contact our office with your prescription refill request.

## 2012-07-06 NOTE — Progress Notes (Signed)
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 He was treated very success of chemotherapy with Rituxan and CHOP. His most recent PET scan still shows no evidence for disease nor does a bone marrow aspirate and biopsy done this month.  Unfortunately has persistent neutropenia. He has had several pneumonias treated on several occasions now with antibiotics and Neupogen. He continues to have rales as left lower base which do not clear with coughing but his left lung does sound better. He has a rare rale at the right base only. The rest of his lungs are clear but with diminished breath sounds. He was a heavy smoker and has COPD in my opinion. He has quit now for the last 2-3 months. I encouraged him to continue to abstain from smoking.  He has seen the infectious disease people in consultation for the frequency of these infections. Dr. Algis Liming and myself agreed to give him Neulasta every 2 weeks for 4 doses and to repeat his PET scan and bone marrow biopsy was done. There is no great explanation other than the chemotherapy induced leukopenia to explain his white count.  Today he is lymph nodes are negative in the cervical, supraclavicular, infraclavicular and axillary areas. He is not febrile today. He is producing only white phlegm at best.  He is using his inhaler twice a day but I have encouraged him to use it 4 times a day until we get a pulmonologist to see him since we may be able to optimize his pulmonary regimen to help ward off these infections.  Therefore we will continue with the Neulasta for 3 more doses. We will see him back in 3 months. We'll get a pulmonology consultation. I will not prescribe further antibiotics today.

## 2012-07-07 ENCOUNTER — Ambulatory Visit (HOSPITAL_COMMUNITY): Payer: Medicare Other

## 2012-07-12 ENCOUNTER — Ambulatory Visit (HOSPITAL_COMMUNITY): Payer: Medicare Other | Admitting: Oncology

## 2012-07-19 ENCOUNTER — Ambulatory Visit (INDEPENDENT_AMBULATORY_CARE_PROVIDER_SITE_OTHER): Payer: Medicare Other | Admitting: *Deleted

## 2012-07-19 DIAGNOSIS — Z7901 Long term (current) use of anticoagulants: Secondary | ICD-10-CM

## 2012-07-19 DIAGNOSIS — I4891 Unspecified atrial fibrillation: Secondary | ICD-10-CM

## 2012-07-21 ENCOUNTER — Encounter (HOSPITAL_BASED_OUTPATIENT_CLINIC_OR_DEPARTMENT_OTHER): Payer: Medicare Other

## 2012-07-21 ENCOUNTER — Encounter (HOSPITAL_BASED_OUTPATIENT_CLINIC_OR_DEPARTMENT_OTHER): Payer: Medicare Other | Admitting: Oncology

## 2012-07-21 VITALS — BP 129/63 | HR 76 | Temp 97.7°F | Resp 18

## 2012-07-21 DIAGNOSIS — C8290 Follicular lymphoma, unspecified, unspecified site: Secondary | ICD-10-CM

## 2012-07-21 DIAGNOSIS — D709 Neutropenia, unspecified: Secondary | ICD-10-CM

## 2012-07-21 DIAGNOSIS — B372 Candidiasis of skin and nail: Secondary | ICD-10-CM

## 2012-07-21 DIAGNOSIS — J189 Pneumonia, unspecified organism: Secondary | ICD-10-CM

## 2012-07-21 DIAGNOSIS — R21 Rash and other nonspecific skin eruption: Secondary | ICD-10-CM

## 2012-07-21 DIAGNOSIS — C8299 Follicular lymphoma, unspecified, extranodal and solid organ sites: Secondary | ICD-10-CM

## 2012-07-21 LAB — CBC WITH DIFFERENTIAL/PLATELET
Basophils Absolute: 0.1 10*3/uL (ref 0.0–0.1)
Basophils Relative: 1 % (ref 0–1)
Eosinophils Absolute: 0.8 10*3/uL — ABNORMAL HIGH (ref 0.0–0.7)
MCH: 28.2 pg (ref 26.0–34.0)
MCHC: 33.1 g/dL (ref 30.0–36.0)
Neutro Abs: 4.5 10*3/uL (ref 1.7–7.7)
Neutrophils Relative %: 66 % (ref 43–77)
RDW: 19.4 % — ABNORMAL HIGH (ref 11.5–15.5)

## 2012-07-21 LAB — COMPREHENSIVE METABOLIC PANEL
AST: 17 U/L (ref 0–37)
Albumin: 3.5 g/dL (ref 3.5–5.2)
Calcium: 9 mg/dL (ref 8.4–10.5)
Creatinine, Ser: 0.86 mg/dL (ref 0.50–1.35)
GFR calc non Af Amer: 84 mL/min — ABNORMAL LOW (ref >90)

## 2012-07-21 MED ORDER — PEGFILGRASTIM INJECTION 6 MG/0.6ML
6.0000 mg | Freq: Once | SUBCUTANEOUS | Status: AC
  Administered 2012-07-21: 6 mg via SUBCUTANEOUS

## 2012-07-21 NOTE — Progress Notes (Signed)
Labs drawn today for cbc/diff,cmp,ldh 

## 2012-07-21 NOTE — Progress Notes (Signed)
Patient is a work in today because of persistent rash in both groins and scrotal areas. He was given Diflucan by Dr. [name]. He has only 2 left a total of 10 and his rash is not better with you talked to his wife for to the patient.  He is immunocompromised of his white count today is excellent.  His sugar has been mildly elevated in the past but is not bad. Therefore I am going to refill his Diflucan because this rash is quite symptomatic and certainly consistent with candidiasis. I will also put him on nystatin powder 4 times a day for a month. If he is not better next week she will let me know.

## 2012-07-22 ENCOUNTER — Ambulatory Visit (INDEPENDENT_AMBULATORY_CARE_PROVIDER_SITE_OTHER): Payer: Medicare Other | Admitting: *Deleted

## 2012-07-22 DIAGNOSIS — Z7901 Long term (current) use of anticoagulants: Secondary | ICD-10-CM

## 2012-07-22 DIAGNOSIS — I4891 Unspecified atrial fibrillation: Secondary | ICD-10-CM

## 2012-07-22 LAB — POCT INR: INR: 2.4

## 2012-07-26 ENCOUNTER — Encounter: Payer: Self-pay | Admitting: Oncology

## 2012-07-27 ENCOUNTER — Encounter (HOSPITAL_COMMUNITY): Payer: Medicare Other | Attending: Oncology

## 2012-07-27 ENCOUNTER — Ambulatory Visit (HOSPITAL_COMMUNITY): Payer: Medicare Other

## 2012-07-27 VITALS — BP 110/73 | HR 101 | Temp 97.8°F | Resp 20

## 2012-07-27 DIAGNOSIS — M949 Disorder of cartilage, unspecified: Secondary | ICD-10-CM

## 2012-07-27 DIAGNOSIS — J189 Pneumonia, unspecified organism: Secondary | ICD-10-CM

## 2012-07-27 DIAGNOSIS — C8290 Follicular lymphoma, unspecified, unspecified site: Secondary | ICD-10-CM

## 2012-07-27 DIAGNOSIS — Z9889 Other specified postprocedural states: Secondary | ICD-10-CM | POA: Insufficient documentation

## 2012-07-27 DIAGNOSIS — Z95828 Presence of other vascular implants and grafts: Secondary | ICD-10-CM

## 2012-07-27 DIAGNOSIS — D709 Neutropenia, unspecified: Secondary | ICD-10-CM

## 2012-07-27 DIAGNOSIS — C8299 Follicular lymphoma, unspecified, extranodal and solid organ sites: Secondary | ICD-10-CM

## 2012-07-27 DIAGNOSIS — Z452 Encounter for adjustment and management of vascular access device: Secondary | ICD-10-CM

## 2012-07-27 MED ORDER — ALTEPLASE 2 MG IJ SOLR
INTRAMUSCULAR | Status: AC
  Filled 2012-07-27: qty 2

## 2012-07-27 MED ORDER — HEPARIN SOD (PORK) LOCK FLUSH 100 UNIT/ML IV SOLN
500.0000 [IU] | Freq: Once | INTRAVENOUS | Status: AC | PRN
  Administered 2012-07-27: 500 [IU]
  Filled 2012-07-27: qty 5

## 2012-07-27 MED ORDER — ALTEPLASE 2 MG IJ SOLR
2.0000 mg | Freq: Once | INTRAMUSCULAR | Status: AC
  Administered 2012-07-27: 2 mg
  Filled 2012-07-27: qty 2

## 2012-07-27 MED ORDER — SODIUM CHLORIDE 0.9 % IV SOLN
Freq: Once | INTRAVENOUS | Status: AC
  Administered 2012-07-27: 13:00:00 via INTRAVENOUS

## 2012-07-27 MED ORDER — SODIUM CHLORIDE 0.9 % IJ SOLN
10.0000 mL | INTRAMUSCULAR | Status: DC | PRN
  Administered 2012-07-27: 10 mL
  Filled 2012-07-27: qty 10

## 2012-07-27 MED ORDER — ZOLEDRONIC ACID 4 MG/5ML IV CONC
4.0000 mg | Freq: Once | INTRAVENOUS | Status: AC
  Administered 2012-07-27: 4 mg via INTRAVENOUS
  Filled 2012-07-27: qty 5

## 2012-07-27 NOTE — Progress Notes (Signed)
Charles [name] presented for Portacath access and flush. Proper placement of portacath confirmed by CXR. Portacath located lt chest wall accessed with  H 20 needle. No blood return and resistance met. Unable to run fluids on pum[. Peripheral I V for zometa and orders received for alteplase. Patient to return tomorrow for removal of alteplase.

## 2012-07-28 ENCOUNTER — Ambulatory Visit (HOSPITAL_COMMUNITY)
Admission: RE | Admit: 2012-07-28 | Discharge: 2012-07-28 | Disposition: A | Discharge status: Home / Self Care | Payer: Medicare Other | Source: Ambulatory Visit | Attending: Oncology | Admitting: Oncology

## 2012-07-28 ENCOUNTER — Other Ambulatory Visit (HOSPITAL_COMMUNITY): Payer: Self-pay | Admitting: Oncology

## 2012-07-28 ENCOUNTER — Encounter (HOSPITAL_BASED_OUTPATIENT_CLINIC_OR_DEPARTMENT_OTHER): Payer: Medicare Other

## 2012-07-28 DIAGNOSIS — Z452 Encounter for adjustment and management of vascular access device: Secondary | ICD-10-CM

## 2012-07-28 DIAGNOSIS — Z95828 Presence of other vascular implants and grafts: Secondary | ICD-10-CM

## 2012-07-28 DIAGNOSIS — R232 Flushing: Secondary | ICD-10-CM | POA: Insufficient documentation

## 2012-07-28 DIAGNOSIS — C8299 Follicular lymphoma, unspecified, extranodal and solid organ sites: Secondary | ICD-10-CM

## 2012-07-28 MED ORDER — IOHEXOL 300 MG/ML  SOLN
50.0000 mL | Freq: Once | INTRAMUSCULAR | Status: AC | PRN
  Administered 2012-07-28: 8 mL via INTRAVENOUS

## 2012-07-28 MED ORDER — HEPARIN SOD (PORK) LOCK FLUSH 100 UNIT/ML IV SOLN
INTRAVENOUS | Status: AC
  Filled 2012-07-28: qty 5

## 2012-07-28 MED ORDER — ALTEPLASE 2 MG IJ SOLR
2.0000 mg | Freq: Once | INTRAMUSCULAR | Status: AC | PRN
  Administered 2012-07-28: 2 mg
  Filled 2012-07-28: qty 2

## 2012-07-28 MED ORDER — ALTEPLASE 2 MG IJ SOLR
INTRAMUSCULAR | Status: AC
  Filled 2012-07-28: qty 2

## 2012-07-28 NOTE — Progress Notes (Signed)
Charles [name] presented for Portacath removal of alteplase and flush. Removed 1 cc of alteplase, no blood return. Resistance met with flushing. Proper placement of portacath confirmed by xray and dye study performed. Order received to repeat alteplase. Portacath access in place and alteplase 2 mg in 2 cc's sterile water.Procedure without incident. Patient tolerated procedure well.

## 2012-07-29 ENCOUNTER — Ambulatory Visit (INDEPENDENT_AMBULATORY_CARE_PROVIDER_SITE_OTHER): Payer: Medicare Other | Admitting: *Deleted

## 2012-07-29 ENCOUNTER — Encounter (HOSPITAL_BASED_OUTPATIENT_CLINIC_OR_DEPARTMENT_OTHER): Payer: Medicare Other

## 2012-07-29 DIAGNOSIS — C8299 Follicular lymphoma, unspecified, extranodal and solid organ sites: Secondary | ICD-10-CM

## 2012-07-29 DIAGNOSIS — Z452 Encounter for adjustment and management of vascular access device: Secondary | ICD-10-CM

## 2012-07-29 DIAGNOSIS — I4891 Unspecified atrial fibrillation: Secondary | ICD-10-CM

## 2012-07-29 DIAGNOSIS — Z7901 Long term (current) use of anticoagulants: Secondary | ICD-10-CM

## 2012-07-29 LAB — POCT INR: INR: 4.4

## 2012-07-29 MED ORDER — HEPARIN SOD (PORK) LOCK FLUSH 100 UNIT/ML IV SOLN
INTRAVENOUS | Status: AC
  Filled 2012-07-29: qty 5

## 2012-07-29 NOTE — Progress Notes (Signed)
Charles [name] presented for Portacath deaccess and alteplase removal. Proper placement of portacath confirmed by CXR. Portacath located lt chest wall. Attempted to withdraw alteplase. Unsuccessful . Needle removed. Will discuss with Dr. Mariel Sleet tomorrow and call patient. Procedure without incident. Patient tolerated procedure well.

## 2012-07-30 ENCOUNTER — Inpatient Hospital Stay (HOSPITAL_COMMUNITY)
Admission: EM | Admit: 2012-07-30 | Discharge: 2012-08-05 | DRG: 871 | Disposition: A | Discharge status: Skilled Nursing Facility | Payer: Medicare Other | Attending: Family Medicine | Admitting: Family Medicine

## 2012-07-30 ENCOUNTER — Other Ambulatory Visit: Payer: Self-pay

## 2012-07-30 ENCOUNTER — Emergency Department (HOSPITAL_COMMUNITY): Payer: Medicare Other

## 2012-07-30 ENCOUNTER — Encounter (HOSPITAL_COMMUNITY): Payer: Self-pay | Admitting: *Deleted

## 2012-07-30 DIAGNOSIS — M25562 Pain in left knee: Secondary | ICD-10-CM | POA: Diagnosis present

## 2012-07-30 DIAGNOSIS — W19XXXA Unspecified fall, initial encounter: Secondary | ICD-10-CM | POA: Diagnosis present

## 2012-07-30 DIAGNOSIS — M25462 Effusion, left knee: Secondary | ICD-10-CM

## 2012-07-30 DIAGNOSIS — M1710 Unilateral primary osteoarthritis, unspecified knee: Secondary | ICD-10-CM | POA: Diagnosis present

## 2012-07-30 DIAGNOSIS — I4891 Unspecified atrial fibrillation: Secondary | ICD-10-CM | POA: Diagnosis present

## 2012-07-30 DIAGNOSIS — D72829 Elevated white blood cell count, unspecified: Secondary | ICD-10-CM | POA: Diagnosis present

## 2012-07-30 DIAGNOSIS — E860 Dehydration: Secondary | ICD-10-CM | POA: Diagnosis present

## 2012-07-30 DIAGNOSIS — Z87891 Personal history of nicotine dependence: Secondary | ICD-10-CM

## 2012-07-30 DIAGNOSIS — Z7901 Long term (current) use of anticoagulants: Secondary | ICD-10-CM

## 2012-07-30 DIAGNOSIS — I10 Essential (primary) hypertension: Secondary | ICD-10-CM | POA: Diagnosis present

## 2012-07-30 DIAGNOSIS — M25569 Pain in unspecified knee: Secondary | ICD-10-CM | POA: Diagnosis present

## 2012-07-30 DIAGNOSIS — C8290 Follicular lymphoma, unspecified, unspecified site: Secondary | ICD-10-CM

## 2012-07-30 DIAGNOSIS — A419 Sepsis, unspecified organism: Principal | ICD-10-CM | POA: Diagnosis present

## 2012-07-30 DIAGNOSIS — Z79899 Other long term (current) drug therapy: Secondary | ICD-10-CM

## 2012-07-30 DIAGNOSIS — M1712 Unilateral primary osteoarthritis, left knee: Secondary | ICD-10-CM

## 2012-07-30 DIAGNOSIS — I428 Other cardiomyopathies: Secondary | ICD-10-CM | POA: Diagnosis present

## 2012-07-30 DIAGNOSIS — M25469 Effusion, unspecified knee: Secondary | ICD-10-CM | POA: Diagnosis present

## 2012-07-30 DIAGNOSIS — Z8249 Family history of ischemic heart disease and other diseases of the circulatory system: Secondary | ICD-10-CM

## 2012-07-30 DIAGNOSIS — F411 Generalized anxiety disorder: Secondary | ICD-10-CM | POA: Diagnosis present

## 2012-07-30 DIAGNOSIS — M19019 Primary osteoarthritis, unspecified shoulder: Secondary | ICD-10-CM | POA: Diagnosis present

## 2012-07-30 DIAGNOSIS — R52 Pain, unspecified: Secondary | ICD-10-CM | POA: Diagnosis present

## 2012-07-30 DIAGNOSIS — D649 Anemia, unspecified: Secondary | ICD-10-CM | POA: Diagnosis present

## 2012-07-30 DIAGNOSIS — J189 Pneumonia, unspecified organism: Secondary | ICD-10-CM | POA: Diagnosis present

## 2012-07-30 DIAGNOSIS — C8299 Follicular lymphoma, unspecified, extranodal and solid organ sites: Secondary | ICD-10-CM | POA: Diagnosis present

## 2012-07-30 DIAGNOSIS — N3289 Other specified disorders of bladder: Secondary | ICD-10-CM | POA: Diagnosis present

## 2012-07-30 LAB — CBC WITH DIFFERENTIAL/PLATELET
Basophils Absolute: 0.1 10*3/uL (ref 0.0–0.1)
Basophils Relative: 0 % (ref 0–1)
Eosinophils Absolute: 0.1 10*3/uL (ref 0.0–0.7)
Eosinophils Relative: 1 % (ref 0–5)
HCT: 30.4 % — ABNORMAL LOW (ref 39.0–52.0)
Lymphocytes Relative: 2 % — ABNORMAL LOW (ref 12–46)
MCH: 28.7 pg (ref 26.0–34.0)
MCHC: 33.9 g/dL (ref 30.0–36.0)
MCV: 84.7 fL (ref 78.0–100.0)
Monocytes Absolute: 0.6 10*3/uL (ref 0.1–1.0)
RDW: 19.2 % — ABNORMAL HIGH (ref 11.5–15.5)

## 2012-07-30 LAB — COMPREHENSIVE METABOLIC PANEL
Albumin: 3.6 g/dL (ref 3.5–5.2)
BUN: 19 mg/dL (ref 6–23)
Chloride: 98 mEq/L (ref 96–112)
Creatinine, Ser: 0.98 mg/dL (ref 0.50–1.35)
GFR calc non Af Amer: 80 mL/min — ABNORMAL LOW (ref >90)
Total Bilirubin: 0.4 mg/dL (ref 0.3–1.2)

## 2012-07-30 LAB — URINALYSIS, ROUTINE W REFLEX MICROSCOPIC
Glucose, UA: NEGATIVE mg/dL
Leukocytes, UA: NEGATIVE
Protein, ur: NEGATIVE mg/dL
Urobilinogen, UA: 0.2 mg/dL (ref 0.0–1.0)

## 2012-07-30 LAB — PROTIME-INR
INR: 2.6 — ABNORMAL HIGH (ref 0.00–1.49)
Prothrombin Time: 26.6 seconds — ABNORMAL HIGH (ref 11.6–15.2)

## 2012-07-30 LAB — LACTIC ACID, PLASMA: Lactic Acid, Venous: 1.9 mmol/L (ref 0.5–2.2)

## 2012-07-30 MED ORDER — WARFARIN SODIUM 2.5 MG PO TABS: 2.5000 mg | ORAL_TABLET | ORAL | Status: DC

## 2012-07-30 MED ORDER — WARFARIN - PHARMACIST DOSING INPATIENT
Status: DC
  Administered 2012-08-02 – 2012-08-04 (×2)

## 2012-07-30 MED ORDER — ACETAMINOPHEN 500 MG PO TABS
ORAL_TABLET | ORAL | Status: AC
  Administered 2012-07-30: 1000 mg via ORAL
  Filled 2012-07-30: qty 2

## 2012-07-30 MED ORDER — VANCOMYCIN HCL IN DEXTROSE 1-5 GM/200ML-% IV SOLN
1000.0000 mg | Freq: Two times a day (BID) | INTRAVENOUS | Status: DC
  Filled 2012-07-30 (×6): qty 200

## 2012-07-30 MED ORDER — LEVOFLOXACIN IN D5W 750 MG/150ML IV SOLN
750.0000 mg | Freq: Once | INTRAVENOUS | Status: AC
  Administered 2012-07-30: 750 mg via INTRAVENOUS
  Filled 2012-07-30: qty 150

## 2012-07-30 MED ORDER — ACETAMINOPHEN 325 MG PO TABS
650.0000 mg | ORAL_TABLET | Freq: Four times a day (QID) | ORAL | Status: DC | PRN
  Administered 2012-07-30 – 2012-08-03 (×9): 650 mg via ORAL
  Filled 2012-07-30 (×9): qty 2

## 2012-07-30 MED ORDER — MORPHINE SULFATE 2 MG/ML IJ SOLN
2.0000 mg | INTRAMUSCULAR | Status: DC | PRN
  Administered 2012-07-30: 2 mg via INTRAVENOUS
  Filled 2012-07-30: qty 1

## 2012-07-30 MED ORDER — DILTIAZEM LOAD VIA INFUSION
10.0000 mg | Freq: Once | INTRAVENOUS | Status: AC
  Administered 2012-07-30: 10 mg via INTRAVENOUS
  Filled 2012-07-30: qty 10

## 2012-07-30 MED ORDER — SODIUM CHLORIDE 0.9 % IV BOLUS (SEPSIS)
500.0000 mL | Freq: Once | INTRAVENOUS | Status: AC
  Administered 2012-07-30: 500 mL via INTRAVENOUS

## 2012-07-30 MED ORDER — DILTIAZEM HCL 100 MG IV SOLR
5.0000 mg/h | INTRAVENOUS | Status: DC
  Administered 2012-07-30: 5 mg/h via INTRAVENOUS
  Administered 2012-07-31: 12 mg/h via INTRAVENOUS
  Administered 2012-07-31: 10 mg/h via INTRAVENOUS
  Administered 2012-08-01: 5 mg/h via INTRAVENOUS
  Filled 2012-07-30: qty 100

## 2012-07-30 MED ORDER — ONDANSETRON HCL 4 MG/2ML IJ SOLN
4.0000 mg | Freq: Once | INTRAMUSCULAR | Status: AC
  Administered 2012-07-30: 4 mg via INTRAVENOUS
  Filled 2012-07-30: qty 2

## 2012-07-30 MED ORDER — ACETAMINOPHEN 500 MG PO TABS
1000.0000 mg | ORAL_TABLET | Freq: Once | ORAL | Status: AC
Start: 2012-07-30 — End: 2012-07-30

## 2012-07-30 MED ORDER — WARFARIN SODIUM 2.5 MG PO TABS: 5.0000 mg | ORAL_TABLET | ORAL | Status: DC

## 2012-07-30 MED ORDER — DEXTROSE 5 % IV SOLN
1.0000 g | Freq: Once | INTRAVENOUS | Status: AC
  Administered 2012-07-30: 1 g via INTRAVENOUS
  Filled 2012-07-30: qty 1

## 2012-07-30 MED ORDER — PANTOPRAZOLE SODIUM 40 MG PO TBEC
40.0000 mg | DELAYED_RELEASE_TABLET | Freq: Every day | ORAL | Status: DC
  Administered 2012-07-31 – 2012-08-05 (×6): 40 mg via ORAL
  Filled 2012-07-30 (×6): qty 1

## 2012-07-30 MED ORDER — TRAMADOL HCL 50 MG PO TABS
100.0000 mg | ORAL_TABLET | Freq: Every day | ORAL | Status: DC
  Administered 2012-07-30 – 2012-08-04 (×6): 100 mg via ORAL
  Filled 2012-07-30 (×6): qty 2

## 2012-07-30 MED ORDER — VANCOMYCIN HCL IN DEXTROSE 1-5 GM/200ML-% IV SOLN
1000.0000 mg | INTRAVENOUS | Status: AC
Start: 2012-07-30 — End: 2012-07-30
  Administered 2012-07-30: 1000 mg via INTRAVENOUS
  Filled 2012-07-30: qty 200

## 2012-07-30 MED ORDER — DEXTROSE 5 % IV SOLN
INTRAVENOUS | Status: AC
  Filled 2012-07-30 (×2): qty 1

## 2012-07-30 MED ORDER — SODIUM CHLORIDE 0.9 % IV SOLN
INTRAVENOUS | Status: DC
  Administered 2012-07-30: 500 mL via INTRAVENOUS

## 2012-07-30 MED ORDER — ALPRAZOLAM 0.5 MG PO TABS
0.5000 mg | ORAL_TABLET | Freq: Four times a day (QID) | ORAL | Status: DC
  Administered 2012-07-30 – 2012-08-05 (×14): 0.5 mg via ORAL
  Filled 2012-07-30 (×15): qty 1

## 2012-07-30 MED ORDER — VANCOMYCIN HCL IN DEXTROSE 1-5 GM/200ML-% IV SOLN
INTRAVENOUS | Status: AC
  Filled 2012-07-30: qty 200

## 2012-07-30 MED ORDER — MORPHINE SULFATE 2 MG/ML IJ SOLN
2.0000 mg | Freq: Once | INTRAMUSCULAR | Status: AC
  Administered 2012-07-30: 2 mg via INTRAVENOUS

## 2012-07-30 MED ORDER — MORPHINE SULFATE 4 MG/ML IJ SOLN
4.0000 mg | Freq: Once | INTRAMUSCULAR | Status: AC
  Administered 2012-07-30: 4 mg via INTRAVENOUS
  Filled 2012-07-30: qty 1

## 2012-07-30 MED ORDER — WARFARIN - PHYSICIAN DOSING INPATIENT: Freq: Every day | Status: DC

## 2012-07-30 MED ORDER — ONDANSETRON HCL 4 MG/2ML IJ SOLN: 4.0000 mg | Freq: Four times a day (QID) | INTRAMUSCULAR | Status: DC | PRN

## 2012-07-30 MED ORDER — MORPHINE SULFATE 2 MG/ML IJ SOLN
2.0000 mg | INTRAMUSCULAR | Status: DC | PRN
  Administered 2012-07-30 – 2012-08-02 (×10): 2 mg via INTRAVENOUS
  Filled 2012-07-30 (×12): qty 1

## 2012-07-30 MED ORDER — GUAIFENESIN ER 600 MG PO TB12
1200.0000 mg | ORAL_TABLET | Freq: Two times a day (BID) | ORAL | Status: DC
  Administered 2012-07-30 – 2012-08-05 (×12): 1200 mg via ORAL
  Filled 2012-07-30 (×12): qty 2

## 2012-07-30 MED ORDER — LEVOFLOXACIN IN D5W 750 MG/150ML IV SOLN
750.0000 mg | INTRAVENOUS | Status: AC
  Administered 2012-07-31 – 2012-08-02 (×3): 750 mg via INTRAVENOUS
  Filled 2012-07-30 (×3): qty 150

## 2012-07-30 MED ORDER — SODIUM CHLORIDE 0.9 % IV SOLN
INTRAVENOUS | Status: DC
  Administered 2012-07-30: 100 mL via INTRAVENOUS
  Administered 2012-07-31 – 2012-08-04 (×11): via INTRAVENOUS

## 2012-07-30 MED ORDER — VANCOMYCIN HCL IN DEXTROSE 1-5 GM/200ML-% IV SOLN
1000.0000 mg | Freq: Two times a day (BID) | INTRAVENOUS | Status: DC
  Administered 2012-07-30 – 2012-08-01 (×5): 1000 mg via INTRAVENOUS
  Filled 2012-07-30 (×7): qty 200

## 2012-07-30 MED ORDER — DEXTROSE 5 % IV SOLN
1.0000 g | Freq: Three times a day (TID) | INTRAVENOUS | Status: DC
  Administered 2012-07-30 – 2012-08-05 (×17): 1 g via INTRAVENOUS
  Filled 2012-07-30 (×21): qty 1

## 2012-07-30 NOTE — Progress Notes (Signed)
ANTIBIOTIC CONSULT NOTE - INITIAL  Pharmacy Consult for Vancomycin Indication: pneumonia  Allergies  Allergen Reactions  . Statins     Intolerant secondary to severe myalgias   Patient Measurements: Height: 5\' 7"  (170.2 cm) Weight: 194 lb (87.998 kg) IBW/kg (Calculated) : 66.1  Vital Signs: Temp: 100.6 F (38.1 C) (05/09 1428) Temp src: Oral (05/09 1428) BP: 126/66 mmHg (05/09 1428) Pulse Rate: 130 (05/09 1428) Intake/Output from previous day:   Intake/Output from this shift:    Labs:  Recent Labs  07/30/12 1245  WBC 18.9*  HGB 10.3*  PLT 212  CREATININE 0.98   Estimated Creatinine Clearance: 71.1 ml/min (by C-G formula based on Cr of 0.98). No results found for this basename: VANCOTROUGH, Leodis Binet, VANCORANDOM, GENTTROUGH, GENTPEAK, GENTRANDOM, TOBRATROUGH, TOBRAPEAK, TOBRARND, AMIKACINPEAK, AMIKACINTROU, AMIKACIN,  in the last 72 hours   Microbiology: Recent Results (from the past 720 hour(s))  CULTURE, BLOOD (ROUTINE X 2)     Status: None   Collection Time    07/30/12 12:30 PM      Result Value Range Status   Specimen Description BLOOD LEFT ARM   Final   Special Requests BOTTLES DRAWN AEROBIC AND ANAEROBIC  8 CC EACH   Final   Culture PENDING   Incomplete   Report Status PENDING   Incomplete  CULTURE, BLOOD (ROUTINE X 2)     Status: None   Collection Time    07/30/12 12:45 PM      Result Value Range Status   Specimen Description BLOOD RIGHT ARM   Final   Special Requests BOTTLES DRAWN AEROBIC AND ANAEROBIC  6 CC EACH   Final   Culture PENDING   Incomplete   Report Status PENDING   Incomplete   Medical History: Past Medical History  Diagnosis Date  . Hypertension   . Atrial fibrillation 10/2008  . Nonischemic cardiomyopathy 02/2009    Presented with CHF; EF of 25-30%; Nonobstructive ASCVD-worst lesion 40% on coronary angiography  . Peptic ulcer disease   . Degenerative joint disease     Knees and shoulders  . Pneumonia 2010  . Low TSH level    Borderline  . Allergic rhinitis   . Anxiety   . Drug abuse   . Tobacco abuse     50 pack years  . 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 Basal cell carcinoma    Medications:  Scheduled:  . [COMPLETED] acetaminophen  1,000 mg Oral Once  . [COMPLETED] morphine  4 mg Intravenous Once  . [COMPLETED] ondansetron  4 mg Intravenous Once   Assessment: 72yo male admitted with suspected pna.  Pt has good renal fxn.  Estimated Creatinine Clearance: 71.1 ml/min (by C-G formula based on Cr of 0.98).  Goal of Therapy:  Vancomycin trough level 15-20 mcg/ml  Plan: Vancomycin 1gm IV q12hrs Check trough at steady state Monitor labs, renal fxn, and cultures Duration of therapy per MD (anticipate 8 days per guidelines)  Valrie Hart A 07/30/2012,3:01 PM

## 2012-07-30 NOTE — Progress Notes (Signed)
ANTICOAGULATION CONSULT NOTE - Initial Consult  Pharmacy Consult for Warfarin Indication: atrial fibrillation  Allergies  Allergen Reactions  . Statins     Intolerant secondary to severe myalgias   Patient Measurements: Height: 5\' 7"  (170.2 cm) Weight: 200 lb 12.8 oz (91.082 kg) IBW/kg (Calculated) : 66.1  Vital Signs: Temp: 100.6 F (38.1 C) (05/09 1428) Temp src: Oral (05/09 1428) BP: 126/66 mmHg (05/09 1428) Pulse Rate: 130 (05/09 1428)  Labs:  Recent Labs  07/29/12 1349 07/30/12 1245  HGB  --  10.3*  HCT  --  30.4*  PLT  --  212  LABPROT  --  26.6*  INR 4.4 2.60*  CREATININE  --  0.98    Estimated Creatinine Clearance: 72.3 ml/min (by C-G formula based on Cr of 0.98).   Medical History: Past Medical History  Diagnosis Date  . Hypertension   . Atrial fibrillation 10/2008  . Nonischemic cardiomyopathy 02/2009    Presented with CHF; EF of 25-30%; Nonobstructive ASCVD-worst lesion 40% on coronary angiography  . Peptic ulcer disease   . Degenerative joint disease     Knees and shoulders  . Pneumonia 2010  . Low TSH level     Borderline  . Allergic rhinitis   . Anxiety   . Drug abuse   . Tobacco abuse     50 pack years  . 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 Basal cell carcinoma     Medications:  Prescriptions prior to admission  Medication Sig Dispense Refill  . ALPRAZolam (XANAX) 1 MG tablet Take 0.5 mg by mouth 4 (four) times daily.       . carvedilol (COREG) 25 MG tablet Take 25 mg by mouth 2 (two) times daily. 1/2 tablet if BP is below 10/70       . diltiazem (CARDIZEM CD) 300 MG 24 hr capsule Take 1 capsule (300 mg total) by mouth daily.  30 capsule  6  . HYDROcodone-acetaminophen (NORCO/VICODIN) 5-325 MG per tablet Take 1 tablet by mouth every 6 (six) hours as needed for pain.       Marland Kitchen lovastatin (MEVACOR) 10 MG tablet Take 10 mg by mouth daily.       . Multiple Vitamins-Iron (ONE-TABLET-DAILY/IRON PO) Take 1 tablet by mouth daily.      Marland Kitchen omeprazole  (PRILOSEC) 20 MG capsule Take 20 mg by mouth daily.      Marland Kitchen PROAIR HFA 108 (90 BASE) MCG/ACT inhaler Inhale 1 puff into the lungs every 6 (six) hours as needed. For shortness of breath      . traMADol (ULTRAM) 50 MG tablet Take 50-100 mg by mouth 4 (four) times daily as needed. For pain      . warfarin (COUMADIN) 5 MG tablet Take 0.5-1 tablets (2.5-5 mg total) by mouth daily. Pt takes 2.5mg  on  sun, tue, thu , sat and 5 mg all other days  30 tablet  3  . denosumab (PROLIA) 60 MG/ML SOLN injection Inject 60 mg into the skin every 6 (six) months. Administer in upper arm, thigh, or abdomen      . filgrastim (NEUPOGEN) 480 MCG/1.6ML injection Inject 1.6 mLs (480 mcg total) into the skin daily at 12 noon.  1.6 mL  0  . furosemide (LASIX) 40 MG tablet Take 20-40 mg by mouth daily as needed (for fluid retention). Pt can take a half to 1 tab as needed for fluid retention      . potassium chloride SA (K-DUR,KLOR-CON) 20 MEQ tablet Take 20 mEq  by mouth daily as needed (takes along with lasix).        Assessment: Okay for Protocol Patient has already received dose today  Goal of Therapy:  INR 2-3   Plan:  Daily PT/INR  Mady Gemma 07/30/2012,6:42 PM

## 2012-07-30 NOTE — ED Notes (Signed)
Fever , chills, 101. At home,  Twisted lt knee yesterday and has pain since then.  Has injection of ?Zomeda .  Cancer center says pts Picc not working.

## 2012-07-30 NOTE — ED Provider Notes (Signed)
History     This chart was scribed for Raeford Razor, MD, MD by Smitty Pluck, ED Scribe. The patient was seen in room APA10/APA10 and the patient's care was started at 1:11 PM.   CSN: [id number]  Arrival date & time 07/30/12  1145     Chief Complaint  Patient presents with  . Fever  . Chills  . Knee Pain    The history is provided by the patient and medical records. No language interpreter was used.   HPI Comments: Charles [name] is a 72 y.o. male with hx of bone cancer who presents to the Emergency Department complaining of constant, moderate left knee pain onset 1 day ago. Pt reports that he twisted his left knee while he was walking to truck. Bearing weight aggravates the knee pain. He states he has hx of left knee surgery for patella fracture.  He has taken hydrocodone at home with minor relief of pain. Pt reports that he has fever (temperature in ED is 103.1) and chills onset 1 day ago. Pt has taken tylenol at home without relief of fever. Pt denies head injury, abdominal pain, nausea, vomiting, diarrhea, weakness, cough, SOB and any other pain. Pt reports that he is currently being treated for CA with Zometa injection but they were having problems 1 day ago with accessing PICC.     Dr. Mariel Sleet is oncologist  Dr. [name] is PCP  Past Medical History  Diagnosis Date  . Hypertension   . Atrial fibrillation 10/2008  . Nonischemic cardiomyopathy 02/2009    Presented with CHF; EF of 25-30%; Nonobstructive ASCVD-worst lesion 40% on coronary angiography  . Peptic ulcer disease   . Degenerative joint disease     Knees and shoulders  . Pneumonia 2010  . Low TSH level     Borderline  . Allergic rhinitis   . Anxiety   . Drug abuse   . Tobacco abuse     50 pack years  . 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 Basal cell carcinoma     Past Surgical History  Procedure Laterality Date  . Neck lesion biopsy  June 24, 2010    Follicular lymphoma  . Skin cancer excision      under  left breast; variously described as melanoma and basal cell  . Rotator cuff repair      right  . Patella fracture surgery  1975  . Hematoma evacuation      after melanoma removal  . Portacath placement  07/12/2010  . Colonoscopy  2009    Also underwent an EGD; patient reports no significant findings  . Bone marrow aspiration  06/2012  . Bone marrow biopsy  06/2012  . Fungal infection      Family History  Problem Relation Age of Onset  . Heart attack Father   . Coronary artery disease Other     History  Substance Use Topics  . Smoking status: Former Smoker -- 1.00 packs/day for 50 years    Types: Cigarettes    Quit date: 04/12/2012  . Smokeless tobacco: Not on file  . Alcohol Use: No      Review of Systems 10 Systems reviewed and all are negative for acute change except as noted in the HPI.   Allergies  Statins  Home Medications   Current Outpatient Rx  Name  Route  Sig  Dispense  Refill  . ALPRAZolam (XANAX) 1 MG tablet   Oral   Take 0.5 mg by mouth 4 (four) times  daily.          . carvedilol (COREG) 25 MG tablet   Oral   Take 25 mg by mouth 2 (two) times daily. 1/2 tablet if BP is below 10/70          . diltiazem (CARDIZEM CD) 300 MG 24 hr capsule   Oral   Take 1 capsule (300 mg total) by mouth daily.   30 capsule   6     DOSES INCREASE   . HYDROcodone-acetaminophen (NORCO/VICODIN) 5-325 MG per tablet   Oral   Take 1 tablet by mouth every 6 (six) hours as needed for pain.          Marland Kitchen lovastatin (MEVACOR) 10 MG tablet   Oral   Take 10 mg by mouth daily.          . Multiple Vitamins-Iron (ONE-TABLET-DAILY/IRON PO)   Oral   Take 1 tablet by mouth daily.         Marland Kitchen omeprazole (PRILOSEC) 20 MG capsule   Oral   Take 20 mg by mouth daily.         Marland Kitchen PROAIR HFA 108 (90 BASE) MCG/ACT inhaler   Inhalation   Inhale 1 puff into the lungs every 6 (six) hours as needed. For shortness of breath         . traMADol (ULTRAM) 50 MG tablet   Oral    Take 50-100 mg by mouth 4 (four) times daily as needed. For pain         . warfarin (COUMADIN) 5 MG tablet   Oral   Take 0.5-1 tablets (2.5-5 mg total) by mouth daily. Pt takes 2.5mg  on  sun, tue, thu , sat and 5 mg all other days   30 tablet   3   . denosumab (PROLIA) 60 MG/ML SOLN injection   Subcutaneous   Inject 60 mg into the skin every 6 (six) months. Administer in upper arm, thigh, or abdomen         . filgrastim (NEUPOGEN) 480 MCG/1.6ML injection   Subcutaneous   Inject 1.6 mLs (480 mcg total) into the skin daily at 12 noon.   1.6 mL   0   . furosemide (LASIX) 40 MG tablet   Oral   Take 20-40 mg by mouth daily as needed (for fluid retention). Pt can take a half to 1 tab as needed for fluid retention         . potassium chloride SA (K-DUR,KLOR-CON) 20 MEQ tablet   Oral   Take 20 mEq by mouth daily as needed (takes along with lasix).            BP 129/70  Pulse 123  Temp(Src) 103.1 F (39.5 C) (Oral)  Resp 28  Ht 5\' 7"  (1.702 m)  Wt 194 lb (87.998 kg)  BMI 30.38 kg/m2  SpO2 96%  Physical Exam  Nursing note and vitals reviewed. Constitutional: He appears well-developed and well-nourished. No distress.  Tired appearing but non toxic  HENT:  Head: Normocephalic and atraumatic.  Eyes: Conjunctivae are normal. Right eye exhibits no discharge. Left eye exhibits no discharge.  Neck: Neck supple.  Cardiovascular: Regular rhythm and normal heart sounds.  Tachycardia present.  Exam reveals no gallop and no friction rub.   No murmur heard. Pulmonary/Chest: Tachypnea noted.  L lower lobe rhonchi. Mild tachypnea. Speaking in shortened sentences. Port L chest.    Abdominal: Soft. He exhibits no distension. There is no tenderness.  Musculoskeletal: He exhibits no  edema and no tenderness.  Left knee deformity that appears chronic  Well healed surgical scar No concerning skin changes   Neurological: He is alert.  Skin: Skin is warm and dry.  Psychiatric: He has  a normal mood and affect. His behavior is normal. Thought content normal.    ED Course  Procedures (including critical care time) DIAGNOSTIC STUDIES: Oxygen Saturation is 96% on room air, adequate by my interpretation.    COORDINATION OF CARE: 1:18 PM Discussed ED treatment with pt (xray of knee and labs) and pt agrees.  Medications  acetaminophen (TYLENOL) tablet 1,000 mg (1,000 mg Oral Given 07/30/12 1218)  sodium chloride 0.9 % bolus 500 mL (500 mLs Intravenous New Bag/Given 07/30/12 1256)      Labs Reviewed  CBC WITH DIFFERENTIAL - Abnormal; Notable for the following:    WBC 18.9 (*)    RBC 3.59 (*)    Hemoglobin 10.3 (*)    HCT 30.4 (*)    RDW 19.2 (*)    Neutrophils Relative 94 (*)    Neutro Abs 17.7 (*)    Lymphocytes Relative 2 (*)    Lymphs Abs 0.5 (*)    All other components within normal limits  COMPREHENSIVE METABOLIC PANEL - Abnormal; Notable for the following:    Sodium 133 (*)    Glucose, Bld 129 (*)    Alkaline Phosphatase 152 (*)    GFR calc non Af Amer 80 (*)    All other components within normal limits  PROTIME-INR - Abnormal; Notable for the following:    Prothrombin Time 26.6 (*)    INR 2.60 (*)    All other components within normal limits  CULTURE, BLOOD (ROUTINE X 2)  CULTURE, BLOOD (ROUTINE X 2)  URINALYSIS, ROUTINE W REFLEX MICROSCOPIC  LACTIC ACID, PLASMA   Dg Chest 2 View  07/30/2012  *RADIOLOGY REPORT*  Clinical Data: Fever, chills  CHEST - 2 VIEW  Comparison: 06/15/2012  Findings: Cardiomediastinal silhouette is stable.  There is small amount of fluid in the right minor fissure.  Right humeral prosthesis.  There is hazy infiltrate/pneumonia in the left lower lobe posteriorly.  IMPRESSION: Hazy infiltrate/pneumonia left lower lobe posteriorly.  Follow-up to resolution after treatment is recommended.   Original Report Authenticated By: Natasha Mead, M.D.      1. HCAP (healthcare-associated pneumonia)   2. Follicular lymphoma   3. Chronic  anticoagulation       MDM  72yM with fever. CXR consistent with pneumonia. Abx for HAP with recent admission.      I personally preformed the services scribed in my presence. The recorded information has been reviewed is accurate. Raeford Razor, MD.    Raeford Razor, MD 08/03/12 [phone]

## 2012-07-30 NOTE — ED Notes (Signed)
Ice pack applied to left knee

## 2012-07-30 NOTE — Progress Notes (Signed)
Patient transferred to ICU 5. Report given to [facility], Charity fundraiser

## 2012-07-30 NOTE — H&P (Signed)
[facility] History and Physical  KAMDIN FOLLETT ZOX:[id number] DOB: Aug 08, 1940 DOA: 07/30/2012  Referring physician: Dr. Jeraldine Loots, EDP PCP: [name], MD  Specialists:   Chief Complaint: Shortness of breath  HPI: Charles [name] is a 72 y.o. male with a history of lymphoma, neutropenia currently receiving Neulasta injections, atrial fibrillation, and recurrent pneumonia. Patient was in his usual state of health when over the past week he developed worsening cough. His wife reports that last night he began to develop fevers. He's feeling increasingly weak and short of breath. He reports the emergency room where he was noted to be febrile, hypoxic, chest x-ray indicated a left lower lobe pneumonia. Patient also reports that he twisted his knee while walking down some stairs. He is complaining of significant pain in that knee.  Review of Systems: Pertinent positives as per history of present illness, otherwise negative  Past Medical History  Diagnosis Date  . Hypertension   . Atrial fibrillation 10/2008  . Nonischemic cardiomyopathy 02/2009    Presented with CHF; EF of 25-30%; Nonobstructive ASCVD-worst lesion 40% on coronary angiography  . Peptic ulcer disease   . Degenerative joint disease     Knees and shoulders  . Pneumonia 2010  . Low TSH level     Borderline  . Allergic rhinitis   . Anxiety   . Drug abuse   . Tobacco abuse     50 pack years  . 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 Basal cell carcinoma    Past Surgical History  Procedure Laterality Date  . Neck lesion biopsy  June 24, 2010    Follicular lymphoma  . Skin cancer excision      under left breast; variously described as melanoma and basal cell  . Rotator cuff repair      right  . Patella fracture surgery  1975  . Hematoma evacuation      after melanoma removal  . Portacath placement  07/12/2010  . Colonoscopy  2009    Also underwent an EGD; patient reports no significant findings  . Bone marrow  aspiration  06/2012  . Bone marrow biopsy  06/2012  . Fungal infection     Social History:  reports that he quit smoking about 3 months ago. His smoking use included Cigarettes. He has a 50 pack-year smoking history. He does not have any smokeless tobacco history on file. He reports that he does not drink alcohol or use illicit drugs.   Allergies  Allergen Reactions  . Statins     Intolerant secondary to severe myalgias    Family History  Problem Relation Age of Onset  . Heart attack Father   . Coronary artery disease Other     Prior to Admission medications   Medication Sig Start Date End Date Taking? Authorizing Provider  ALPRAZolam Prudy Feeler) 1 MG tablet Take 0.5 mg by mouth 4 (four) times daily.    Yes Historical Provider, MD  carvedilol (COREG) 25 MG tablet Take 25 mg by mouth 2 (two) times daily. 1/2 tablet if BP is below 10/70  03/12/12  Yes Kathlen Brunswick, MD  diltiazem (CARDIZEM CD) 300 MG 24 hr capsule Take 1 capsule (300 mg total) by mouth daily. 05/17/12  Yes Kathlen Brunswick, MD  HYDROcodone-acetaminophen (NORCO/VICODIN) 5-325 MG per tablet Take 1 tablet by mouth every 6 (six) hours as needed for pain.  02/13/12  Yes Maurine Minister Kefalas, PA-C  lovastatin (MEVACOR) 10 MG tablet Take 10 mg by mouth daily.  Yes Historical Provider, MD  Multiple Vitamins-Iron (ONE-TABLET-DAILY/IRON PO) Take 1 tablet by mouth daily.   Yes Historical Provider, MD  omeprazole (PRILOSEC) 20 MG capsule Take 20 mg by mouth daily.   Yes Historical Provider, MD  PROAIR HFA 108 (90 BASE) MCG/ACT inhaler Inhale 1 puff into the lungs every 6 (six) hours as needed. For shortness of breath 01/15/12  Yes Historical Provider, MD  traMADol (ULTRAM) 50 MG tablet Take 50-100 mg by mouth 4 (four) times daily as needed. For pain   Yes Historical Provider, MD  warfarin (COUMADIN) 5 MG tablet Take 0.5-1 tablets (2.5-5 mg total) by mouth daily. Pt takes 2.5mg  on  sun, tue, thu , sat and 5 mg all other days 05/17/12  Yes  Kathlen Brunswick, MD  denosumab (PROLIA) 60 MG/ML SOLN injection Inject 60 mg into the skin every 6 (six) months. Administer in upper arm, thigh, or abdomen    Historical Provider, MD  filgrastim (NEUPOGEN) 480 MCG/1.6ML injection Inject 1.6 mLs (480 mcg total) into the skin daily at 12 noon. 05/11/12   Laveda Norman, MD  furosemide (LASIX) 40 MG tablet Take 20-40 mg by mouth daily as needed (for fluid retention). Pt can take a half to 1 tab as needed for fluid retention    Historical Provider, MD  potassium chloride SA (K-DUR,KLOR-CON) 20 MEQ tablet Take 20 mEq by mouth daily as needed (takes along with lasix).  02/13/12   Ellouise Newer, PA-C   Physical Exam: Filed Vitals:   07/30/12 1203 07/30/12 1428 07/30/12 1537  BP: 129/70 126/66   Pulse: 123 130   Temp: 103.1 F (39.5 C) 100.6 F (38.1 C)   TempSrc: Oral Oral   Resp: 28 20   Height: 5\' 7"  (1.702 m)  5\' 7"  (1.702 m)  Weight: 87.998 kg (194 lb)  91.082 kg (200 lb 12.8 oz)  SpO2: 96% 90%      General: No acute distress, appears to be acutely ill.   Eyes: Pupils are equal round react to light  ENT: Mucous membranes are dry, positive for pharyngeal erythema  Neck: Supple  Cardiovascular: S1, S2, irregular, tachycardic  Respiratory: Rhonchi bilaterally  Abdomen: Soft, nontender, nondistended, bowel sounds are active  Skin: No rashes  Musculoskeletal: Tenderness to palpation in the left knee without significant erythema, warmth  Psychiatric: Normal affect, cooperative exam  Neurologic: Grossly intact, nonfocal  Labs on Admission:  Basic Metabolic Panel:  Recent Labs Lab 07/30/12 1245  NA 133*  K 4.5  CL 98  CO2 23  GLUCOSE 129*  BUN 19  CREATININE 0.98  CALCIUM 8.6   Liver Function Tests:  Recent Labs Lab 07/30/12 1245  AST 14  ALT 9  ALKPHOS 152*  BILITOT 0.4  PROT 7.7  ALBUMIN 3.6   No results found for this basename: LIPASE, AMYLASE,  in the last 168 hours No results found for this basename:  AMMONIA,  in the last 168 hours CBC:  Recent Labs Lab 07/30/12 1245  WBC 18.9*  NEUTROABS 17.7*  HGB 10.3*  HCT 30.4*  MCV 84.7  PLT 212   Cardiac Enzymes: No results found for this basename: CKTOTAL, CKMB, CKMBINDEX, TROPONINI,  in the last 168 hours  BNP (last 3 results) No results found for this basename: PROBNP,  in the last 8760 hours CBG: No results found for this basename: GLUCAP,  in the last 168 hours  Radiological Exams on Admission: Dg Chest 2 View  07/30/2012  *RADIOLOGY REPORT*  Clinical  Data: Fever, chills  CHEST - 2 VIEW  Comparison: 06/15/2012  Findings: Cardiomediastinal silhouette is stable.  There is small amount of fluid in the right minor fissure.  Right humeral prosthesis.  There is hazy infiltrate/pneumonia in the left lower lobe posteriorly.  IMPRESSION: Hazy infiltrate/pneumonia left lower lobe posteriorly.  Follow-up to resolution after treatment is recommended.   Original Report Authenticated By: Natasha Mead, M.D.    Dg Knee Complete 4 Views Left  07/30/2012  *RADIOLOGY REPORT*  Clinical Data: Left knee pain and swelling.  LEFT KNEE - COMPLETE 4+ VIEW  Comparison: None.  Findings: Prior patellectomy noted.  Small ossific fragments noted in the expected location of the patella.  Markedly severe loss articular space noted in the lateral compartment with marginal spurring in the medial and lateral compartments.  Chondrocalcinosis of the menisci observed. Ossification and spurring noted along the lateral margin of the lateral femoral condyle.  IMPRESSION:  1.  Severe CPPD arthropathy of the knee. 2.  Prior patellectomy. 3.  Ossifications along the lateral margin of the lateral femoral condyle with fragmented spurring in this vicinity.   Original Report Authenticated By: Gaylyn Rong, M.D.     EKG: Independently reviewed. Atrial fibrillation with RVR  Assessment/Plan Principal Problem:   Sepsis Active Problems:   HYPERTENSION   Chronic anticoagulation    Follicular lymphoma   Anemia, normocytic normochromic   HCAP (healthcare-associated pneumonia)   Atrial fibrillation with rapid ventricular response   Leukocytosis, unspecified   Left knee pain   1. Sepsis due to pneumonia. Patient will be admitted to the step down unit. He'll be started on IV fluids and antibiotics her healthcare acquired infections. He has a significant leukocytosis and therefore Neulasta will not be continued. We will place him on pulmonary hygiene. 2. Atrial fibrillation rapid ventricular response. Likely precipitated by underlying infection and fever. He will need monitored in the step down unit. We'll likely start Cardizem infusion to control heart rate. Continue IV fluids. He is anticoagulated with Coumadin. 3. Lymphoma. Patient reports that he is in remission. Followup with oncology 4. History of neutropenia. Patient is receiving Neulasta injections which is likely contributing to leukocytosis. We'll hold Norvasc for now. 5. Anemia. Likely due to underlying malignancy. We'll continue to follow. 6. Left knee pain. X-rays and he did not reveal any acute process. Likely related to ligament strain. We'll provide ice packs and pain control. 7. Dehydration. Give IV fluids  Patient will be [facility] the [facility] until 7 AM on 5/10 at which time Dr. [name] will assume care.   Code Status: Full code Family Communication: discussed with patient and wife at the bedside Disposition Plan: pending hospital course  Time spent:  [facility] Pager [phone]  If 7PM-7AM, please contact night-coverage [email or web address] Password [facility] 07/30/2012, 6:27 PM

## 2012-07-30 NOTE — ED Notes (Signed)
Report called to Harvey, RN on unit 300.

## 2012-07-31 LAB — COMPREHENSIVE METABOLIC PANEL
ALT: 7 U/L (ref 0–53)
BUN: 16 mg/dL (ref 6–23)
Calcium: 7.8 mg/dL — ABNORMAL LOW (ref 8.4–10.5)
GFR calc Af Amer: 82 mL/min — ABNORMAL LOW (ref >90)
Glucose, Bld: 114 mg/dL — ABNORMAL HIGH (ref 70–99)
Sodium: 132 mEq/L — ABNORMAL LOW (ref 135–145)
Total Protein: 6.9 g/dL (ref 6.0–8.3)

## 2012-07-31 LAB — CBC
HCT: 27.8 % — ABNORMAL LOW (ref 39.0–52.0)
Hemoglobin: 9.4 g/dL — ABNORMAL LOW (ref 13.0–17.0)
WBC: 16.9 10*3/uL — ABNORMAL HIGH (ref 4.0–10.5)

## 2012-07-31 LAB — TSH: TSH: 0.793 u[IU]/mL (ref 0.350–4.500)

## 2012-07-31 LAB — PROTIME-INR: INR: 2.2 — ABNORMAL HIGH (ref 0.00–1.49)

## 2012-07-31 LAB — LACTIC ACID, PLASMA: Lactic Acid, Venous: 0.9 mmol/L (ref 0.5–2.2)

## 2012-07-31 MED ORDER — CARVEDILOL 12.5 MG PO TABS
12.5000 mg | ORAL_TABLET | Freq: Two times a day (BID) | ORAL | Status: DC
  Administered 2012-07-31 – 2012-08-01 (×2): 12.5 mg via ORAL
  Filled 2012-07-31 (×2): qty 1

## 2012-07-31 MED ORDER — BIOTENE DRY MOUTH MT LIQD
15.0000 mL | Freq: Two times a day (BID) | OROMUCOSAL | Status: DC
  Administered 2012-07-31 – 2012-08-05 (×11): 15 mL via OROMUCOSAL

## 2012-07-31 MED ORDER — WARFARIN SODIUM 2.5 MG PO TABS
2.5000 mg | ORAL_TABLET | Freq: Once | ORAL | Status: AC
  Administered 2012-07-31: 2.5 mg via ORAL
  Filled 2012-07-31: qty 1

## 2012-07-31 MED ORDER — SODIUM CHLORIDE 0.9 % IV BOLUS (SEPSIS)
500.0000 mL | Freq: Once | INTRAVENOUS | Status: AC
  Administered 2012-07-31: 500 mL via INTRAVENOUS

## 2012-07-31 MED ORDER — DILTIAZEM HCL ER COATED BEADS 120 MG PO CP24
ORAL_CAPSULE | ORAL | Status: AC
  Filled 2012-07-31: qty 1

## 2012-07-31 MED ORDER — DILTIAZEM HCL ER 60 MG PO CP12
120.0000 mg | ORAL_CAPSULE | Freq: Two times a day (BID) | ORAL | Status: DC
  Administered 2012-07-31 – 2012-08-05 (×10): 120 mg via ORAL
  Filled 2012-07-31 (×12): qty 2

## 2012-07-31 NOTE — Progress Notes (Signed)
[facility] Night Coverage Note  Patient transferred to stepdown for PNA sepsis. Noted to have significantly reduced urine output, difficulty voiding and distended bladder causing pain and discomfort. Given his immobility and critical illness orders given to place Foley catheter for relief of urinary retension and for strict I/O. Patient had 800cc urine out following Foley insertion. Consider starting on Flomax when BP improved and Foley removed. Additional morphine given for knee pain. He remains in A-Fib with RVR, tolerating Diltizem gtt titration but may need additional rate control. Will continue to follow closely for additional needs.  Anderson Malta, DO Internal Medicine

## 2012-07-31 NOTE — Consult Note (Signed)
Asked to see patient concerning dysfunctional Port-A-Cath. A chest x-ray done 2 days ago reveals a Port-A-Cath to be in adequate position. Contrast chest x-ray reveals a fibrin sheath around the tip of it. Low dose streptokinase was instilled at that time. I was able to access the port. It is easy to instill saline into the port. I do not get good back flow. This is not unexpected. Given the current situation, I have instructed the nurses to use the port. Please call me if I can be of further assistance.

## 2012-07-31 NOTE — Plan of Care (Signed)
Problem: Phase I Progression Outcomes Goal: Pain controlled with appropriate interventions Outcome: Progressing Difficult start to pain control but once we got ahead of it, patient was more comfortable. Goal: OOB as tolerated unless otherwise ordered Outcome: Progressing Patient transferred to a higher level of care tonight, bilat pneumonia, +blood cultures, Afib RVR, Left knee pain Goal: Initial discharge plan identified Outcome: Progressing Lives home alone, plan to return home Goal: Hemodynamically stable Outcome: Progressing Hypertensive episodes tonight, managing with hydralazine, nitro gtt and coreg to begin in am

## 2012-07-31 NOTE — Progress Notes (Signed)
Called Dr. Janna Arch again regarding pt's BP being low and brought it to his attention about Left chest port not being able to be accessed. He said to put in surgical consult and give another 500 cc bolus.

## 2012-07-31 NOTE — Progress Notes (Signed)
ANTICOAGULATION CONSULT NOTE  Pharmacy Consult for Warfarin Indication: atrial fibrillation  Allergies  Allergen Reactions  . Statins     Intolerant secondary to severe myalgias   Patient Measurements: Height: 5\' 7"  (170.2 cm) Weight: 205 lb 11 oz (93.3 kg) IBW/kg (Calculated) : 66.1  Vital Signs: Temp: 99.5 F (37.5 C) (05/10 0730) Temp src: Axillary (05/10 0730) BP: 104/56 mmHg (05/10 0615) Pulse Rate: 103 (05/10 0615)  Labs:  Recent Labs  07/29/12 1349 07/30/12 1245 07/31/12 0512  HGB  --  10.3* 9.4*  HCT  --  30.4* 27.8*  PLT  --  212 154  LABPROT  --  26.6* 23.5*  INR 4.4 2.60* 2.20*  CREATININE  --  0.98 1.02   Estimated Creatinine Clearance: 70.2 ml/min (by C-G formula based on Cr of 1.02).  Medical History: Past Medical History  Diagnosis Date  . Hypertension   . Atrial fibrillation 10/2008  . Nonischemic cardiomyopathy 02/2009    Presented with CHF; EF of 25-30%; Nonobstructive ASCVD-worst lesion 40% on coronary angiography  . Peptic ulcer disease   . Degenerative joint disease     Knees and shoulders  . Pneumonia 2010  . Low TSH level     Borderline  . Allergic rhinitis   . Anxiety   . Drug abuse   . Tobacco abuse     50 pack years  . 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 Basal cell carcinoma    Medications:  Prescriptions prior to admission  Medication Sig Dispense Refill  . ALPRAZolam (XANAX) 1 MG tablet Take 0.5 mg by mouth 4 (four) times daily.       . carvedilol (COREG) 25 MG tablet Take 25 mg by mouth 2 (two) times daily. 1/2 tablet if BP is below 10/70       . diltiazem (CARDIZEM CD) 300 MG 24 hr capsule Take 1 capsule (300 mg total) by mouth daily.  30 capsule  6  . HYDROcodone-acetaminophen (NORCO/VICODIN) 5-325 MG per tablet Take 1 tablet by mouth every 6 (six) hours as needed for pain.       Marland Kitchen lovastatin (MEVACOR) 10 MG tablet Take 10 mg by mouth daily.       . Multiple Vitamins-Iron (ONE-TABLET-DAILY/IRON PO) Take 1 tablet by mouth  daily.      Marland Kitchen omeprazole (PRILOSEC) 20 MG capsule Take 20 mg by mouth daily.      Marland Kitchen PROAIR HFA 108 (90 BASE) MCG/ACT inhaler Inhale 1 puff into the lungs every 6 (six) hours as needed. For shortness of breath      . traMADol (ULTRAM) 50 MG tablet Take 50-100 mg by mouth 4 (four) times daily as needed. For pain      . warfarin (COUMADIN) 5 MG tablet Take 0.5-1 tablets (2.5-5 mg total) by mouth daily. Pt takes 2.5mg  on  sun, tue, thu , sat and 5 mg all other days  30 tablet  3  . denosumab (PROLIA) 60 MG/ML SOLN injection Inject 60 mg into the skin every 6 (six) months. Administer in upper arm, thigh, or abdomen      . filgrastim (NEUPOGEN) 480 MCG/1.6ML injection Inject 1.6 mLs (480 mcg total) into the skin daily at 12 noon.  1.6 mL  0  . furosemide (LASIX) 40 MG tablet Take 20-40 mg by mouth daily as needed (for fluid retention). Pt can take a half to 1 tab as needed for fluid retention      . potassium chloride SA (K-DUR,KLOR-CON) 20 MEQ tablet Take  20 mEq by mouth daily as needed (takes along with lasix).        Assessment: Okay for Protocol Recent elevated INR (4.4) prior to admission.  Goal of Therapy:  INR 2-3   Plan:  Warfarin 2.5mg  PO x 1 today. Daily PT/INR  Mady Gemma 07/31/2012,8:07 AM

## 2012-07-31 NOTE — Progress Notes (Signed)
CRITICAL VALUE ALERT  Critical value received:  +blood cultures, gram +cocci in chains  Date of notification:  07/31/12  Time of notification:  0150  Critical value read back:yes  Nurse who received alert:  Consuello Masse RN  MD notified (1st page):  Dr. Phillips Odor  Time of first page:  0200  MD notified (2nd page):  Time of second page:  Responding MD: Dr. Phillips Odor  Time MD responded:  0200

## 2012-07-31 NOTE — Progress Notes (Signed)
Called Dr. Janna Arch regarding pt's BP being low and he ordered 500cc bolus.

## 2012-07-31 NOTE — Progress Notes (Signed)
Patient transferred to unit at 1845, patient was diaphoretic and focused on voiding.  Assisted to side of bed (reason to ED per patient) due to twisted Left knee.  Unable to void, after discussion with spouse, patient has not voided since 1100 this am since brought to ED.  9 hrs ago. Needing to begin a cardizem gtt for Afib RVR, oxygen saturation 86-90% sitting at bedside.  Oxygen increased to 3.5 liters with saturation 93%. Notified MD of patient urgency and symptoms.  Order given for foley catheter placement. Patient assisted back to bed.   14FR placed using sterile technique with 800cc return.  Patient stated he felt much relief.

## 2012-07-31 NOTE — Progress Notes (Signed)
NAME:  RED, MANDT NO.:  [id number]  MEDICAL RECORD NO.:  [id number]  LOCATION:  IC05                          FACILITY:  [facility]  PHYSICIAN:  Melvyn Novas, MDDATE OF BIRTH:  Jan 07, 1941  DATE OF PROCEDURE:  07/31/2012 DATE OF DISCHARGE:                                PROGRESS NOTE   A 72 year old white male, a patient of Dr. [name] who did not appear to computer sheets, so I am seeing him today for Dr. [name], who was admitted with fever chills and left lower lobe infiltrate, placed on triple antibiotics of Maxiphen, Levaquin, and vancomycin.  Gram-positive Coxsackie appeared on the Gram stain of the blood culture.  He is presumably being treated for septicemia.  Hemodynamically, he is much improved.  Lactic acid is now within normal limits.  T-max today is 102.4.  He fell and twisted his knee 3 days ago, this is tender swollen knee.  There is some degree of rubor, it could be hemarthrosis, it could be pyogenic knee, we will have orthopedic surgery look at the knee and evaluate to see if I and D and examination of the fluid, arthrocentesis is necessary.  He has chronic atrial fibrillation on Coumadin, currently on Cardizem infusion.  Blood pressure 123/77, temperature 98.0.  At present, pulse 99 and regular, respiratory rate 30.  WBC 16.9, hemoglobin 9.4, INR 2.2 on Coumadin.  Creatinine is 1.02.  Lungs with prolonged expiratory phase, some mild use of accessory muscles with respiration.  Diminished breath sounds at bases.  No rales, no wheeze is appreciable.  Heart, regular no S3 auscultated.  He does have nonischemic cardiomyopathy with the greatest lesion of 40% in the coronary arteries, ejection fraction 25%. Left knee tender, mild rubor, swollen, limitation of motion.  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  He had patellectomy and minor occasions of the lateral margin of femoral condyle.  We will have orthopedic  surgery look at the knee to see if arthrocentesis is indicated.  The plan right now is to continue triple antibiotics.  Monitor the renal function daily.  Monitor CBC as he has some myelodysplastic syndrome, control hemodynamics and ventricular response and febrile response. Monitor CBC daily.     Melvyn Novas, MD     RMD/MEDQ  D:  07/31/2012  T:  07/31/2012  Job:  [id number]

## 2012-07-31 NOTE — Progress Notes (Signed)
Patient states "no relief from Ultram and he can not tolerate the pain rated a 10. Notified MD.  OK to give morphine on top of 100mg  Ultram.  Also additional 2mg  dose ordered for relief.

## 2012-07-31 NOTE — Progress Notes (Signed)
[id number] 

## 2012-07-31 NOTE — Progress Notes (Signed)
Patient O2 sats 85% room air upon arriving to floor from ED. O2 at 2lpm via nasal cannula applied, O2 sats increased to 91% Dr. Kerry Hough notified.

## 2012-07-31 NOTE — Progress Notes (Signed)
Patient in severe pain due to left knee.  Xray done earlier. Patient stated the pain is a 10 and he does not believe he can bear it any longer.  Wife at bedside and usual nightime regimen of 100mg  ultram and 0.5mg  xanax helps patient to rest.  Notified MD dose of each given.  Patient with mild confusion on admission stating that the month was June. Hoping his usual medication would alleviate his confusion.

## 2012-08-01 LAB — VANCOMYCIN, TROUGH: Vancomycin Tr: 11.8 ug/mL (ref 10.0–20.0)

## 2012-08-01 LAB — CULTURE, BLOOD (ROUTINE X 2)

## 2012-08-01 LAB — BASIC METABOLIC PANEL
BUN: 16 mg/dL (ref 6–23)
Creatinine, Ser: 0.88 mg/dL (ref 0.50–1.35)
GFR calc Af Amer: >90 mL/min (ref >90)
GFR calc non Af Amer: 83 mL/min — ABNORMAL LOW (ref >90)

## 2012-08-01 LAB — DIFFERENTIAL
Basophils Absolute: 0 10*3/uL (ref 0.0–0.1)
Eosinophils Absolute: 0 10*3/uL (ref 0.0–0.7)
Eosinophils Relative: 0 % (ref 0–5)

## 2012-08-01 LAB — LEGIONELLA ANTIGEN, URINE: Legionella Antigen, Urine: NEGATIVE

## 2012-08-01 LAB — CBC
MCV: 84.9 fL (ref 78.0–100.0)
Platelets: 157 10*3/uL (ref 150–400)
RDW: 19.6 % — ABNORMAL HIGH (ref 11.5–15.5)
WBC: 14.9 10*3/uL — ABNORMAL HIGH (ref 4.0–10.5)

## 2012-08-01 LAB — PROTIME-INR: Prothrombin Time: 21.5 seconds — ABNORMAL HIGH (ref 11.6–15.2)

## 2012-08-01 LAB — EXPECTORATED SPUTUM ASSESSMENT W GRAM STAIN, RFLX TO RESP C

## 2012-08-01 MED ORDER — WARFARIN SODIUM 5 MG PO TABS
5.0000 mg | ORAL_TABLET | Freq: Once | ORAL | Status: AC
  Administered 2012-08-01: 5 mg via ORAL
  Filled 2012-08-01: qty 1

## 2012-08-01 MED ORDER — CARVEDILOL 12.5 MG PO TABS
25.0000 mg | ORAL_TABLET | Freq: Two times a day (BID) | ORAL | Status: DC
  Administered 2012-08-01 – 2012-08-05 (×7): 25 mg via ORAL
  Filled 2012-08-01 (×7): qty 2
  Filled 2012-08-01 (×2): qty 1

## 2012-08-01 MED ORDER — SODIUM CHLORIDE 0.9 % IV BOLUS (SEPSIS)
500.0000 mL | Freq: Once | INTRAVENOUS | Status: AC
  Administered 2012-08-01: 500 mL via INTRAVENOUS

## 2012-08-01 MED ORDER — DEXTROSE 5 % IV SOLN
INTRAVENOUS | Status: AC
  Filled 2012-08-01 (×2): qty 1

## 2012-08-01 MED ORDER — CARVEDILOL 12.5 MG PO TABS
12.5000 mg | ORAL_TABLET | Freq: Once | ORAL | Status: AC
  Administered 2012-08-01: 12.5 mg via ORAL

## 2012-08-01 MED ORDER — HYDROCODONE-ACETAMINOPHEN 5-325 MG PO TABS
1.0000 | ORAL_TABLET | Freq: Once | ORAL | Status: AC
  Administered 2012-08-01: 1 via ORAL
  Filled 2012-08-01: qty 1

## 2012-08-01 MED ORDER — TRAMADOL HCL 50 MG PO TABS: 50.0000 mg | ORAL_TABLET | Freq: Once | ORAL | Status: DC

## 2012-08-01 NOTE — Progress Notes (Signed)
Notified Dr. Janna Arch regarding lab calling regarding pt being positive for beta strep type A

## 2012-08-01 NOTE — Progress Notes (Signed)
Rockwell Germany, RN placed called to Dr. Janna Arch at 0730 regarding cardizem drip being turned back on at 0306. Dr. Janna Arch called back at 0900 and said to stop cardizem drip and give PO cardizem 120 mg now and increase carvedilol to 25 mg po. He states he will make other changes as necessary when he comes to make rounds.

## 2012-08-01 NOTE — Progress Notes (Signed)
[id number] 

## 2012-08-01 NOTE — Progress Notes (Signed)
eLink Physician-Brief Progress Note Patient Name: Charles [name] DOB: 10-Jun-1940 MRN: [id number]  Date of Service  08/01/2012   HPI/Events of Note   Morphine not helping patient and dropping blood pressure   eICU Interventions   try home Ultram 50 mg x1 dose and Vicodin 5-325 one dose    Intervention Category Intermediate Interventions: Pain - evaluation and management  Yameli Delamater 08/01/2012, 9:51 PM

## 2012-08-01 NOTE — Progress Notes (Signed)
ANTICOAGULATION CONSULT NOTE  Pharmacy Consult for Warfarin Indication: atrial fibrillation  Allergies  Allergen Reactions  . Statins     Intolerant secondary to severe myalgias   Patient Measurements: Height: 5\' 7"  (170.2 cm) Weight: 205 lb 11 oz (93.3 kg) IBW/kg (Calculated) : 66.1  Vital Signs: Temp: 98.1 F (36.7 C) (05/11 0830) Temp src: Oral (05/11 0830) BP: 100/54 mmHg (05/11 0846) Pulse Rate: 108 (05/11 0900)  Labs:  Recent Labs  07/30/12 1245 07/31/12 0512 08/01/12 0457  HGB 10.3* 9.4* 8.8*  HCT 30.4* 27.8* 26.9*  PLT 212 154 157  LABPROT 26.6* 23.5* 21.5*  INR 2.60* 2.20* 1.95*  CREATININE 0.98 1.02 0.88   Estimated Creatinine Clearance: 81.4 ml/min (by C-G formula based on Cr of 0.88).  Medical History: Past Medical History  Diagnosis Date  . Hypertension   . Atrial fibrillation 10/2008  . Nonischemic cardiomyopathy 02/2009    Presented with CHF; EF of 25-30%; Nonobstructive ASCVD-worst lesion 40% on coronary angiography  . Peptic ulcer disease   . Degenerative joint disease     Knees and shoulders  . Pneumonia 2010  . Low TSH level     Borderline  . Allergic rhinitis   . Anxiety   . Drug abuse   . Tobacco abuse     50 pack years  . 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 Basal cell carcinoma    Medications:  Prescriptions prior to admission  Medication Sig Dispense Refill  . ALPRAZolam (XANAX) 1 MG tablet Take 0.5 mg by mouth 4 (four) times daily.       . carvedilol (COREG) 25 MG tablet Take 25 mg by mouth 2 (two) times daily. 1/2 tablet if BP is below 10/70       . diltiazem (CARDIZEM CD) 300 MG 24 hr capsule Take 1 capsule (300 mg total) by mouth daily.  30 capsule  6  . HYDROcodone-acetaminophen (NORCO/VICODIN) 5-325 MG per tablet Take 1 tablet by mouth every 6 (six) hours as needed for pain.       Marland Kitchen lovastatin (MEVACOR) 10 MG tablet Take 10 mg by mouth daily.       . Multiple Vitamins-Iron (ONE-TABLET-DAILY/IRON PO) Take 1 tablet by mouth  daily.      Marland Kitchen omeprazole (PRILOSEC) 20 MG capsule Take 20 mg by mouth daily.      . pegfilgrastim (NEULASTA) 6 MG/0.6ML injection Inject 6 mg into the skin every 14 (fourteen) days. For a total of 4 doses.      Marland Kitchen PROAIR HFA 108 (90 BASE) MCG/ACT inhaler Inhale 1 puff into the lungs every 6 (six) hours as needed. For shortness of breath      . traMADol (ULTRAM) 50 MG tablet Take 50-100 mg by mouth 4 (four) times daily as needed. For pain      . warfarin (COUMADIN) 5 MG tablet Take 0.5-1 tablets (2.5-5 mg total) by mouth daily. Pt takes 2.5mg  on  sun, tue, thu , sat and 5 mg all other days  30 tablet  3  . denosumab (PROLIA) 60 MG/ML SOLN injection Inject 60 mg into the skin every 6 (six) months. Administer in upper arm, thigh, or abdomen      . furosemide (LASIX) 40 MG tablet Take 20-40 mg by mouth daily as needed (for fluid retention). Pt can take a half to 1 tab as needed for fluid retention      . potassium chloride SA (K-DUR,KLOR-CON) 20 MEQ tablet Take 20 mEq by mouth daily as needed (takes  along with lasix).        Assessment: Okay for Protocol Recent elevated INR (4.4) prior to admission and dose reduced to above regimen.  INR now below goal.  Goal of Therapy:  INR 2-3   Plan:  Warfarin 5mg  PO x 1 today. Daily PT/INR  Mady Gemma 08/01/2012,9:12 AM

## 2012-08-02 ENCOUNTER — Ambulatory Visit: Payer: Medicare Other | Admitting: Internal Medicine

## 2012-08-02 ENCOUNTER — Encounter (HOSPITAL_COMMUNITY): Payer: Self-pay | Admitting: Orthopedic Surgery

## 2012-08-02 DIAGNOSIS — M25469 Effusion, unspecified knee: Secondary | ICD-10-CM

## 2012-08-02 DIAGNOSIS — M1710 Unilateral primary osteoarthritis, unspecified knee: Secondary | ICD-10-CM

## 2012-08-02 DIAGNOSIS — M25569 Pain in unspecified knee: Secondary | ICD-10-CM

## 2012-08-02 LAB — CBC
HCT: 25.4 % — ABNORMAL LOW (ref 39.0–52.0)
Hemoglobin: 8.6 g/dL — ABNORMAL LOW (ref 13.0–17.0)
MCHC: 33.9 g/dL (ref 30.0–36.0)
RDW: 19.5 % — ABNORMAL HIGH (ref 11.5–15.5)
WBC: 11.3 10*3/uL — ABNORMAL HIGH (ref 4.0–10.5)

## 2012-08-02 LAB — DIFFERENTIAL
Basophils Absolute: 0 10*3/uL (ref 0.0–0.1)
Basophils Relative: 0 % (ref 0–1)
Lymphocytes Relative: 5 % — ABNORMAL LOW (ref 12–46)
Monocytes Absolute: 0.5 10*3/uL (ref 0.1–1.0)
Neutro Abs: 10.2 10*3/uL — ABNORMAL HIGH (ref 1.7–7.7)
Neutrophils Relative %: 90 % — ABNORMAL HIGH (ref 43–77)

## 2012-08-02 LAB — PROTIME-INR
INR: 2.39 — ABNORMAL HIGH (ref 0.00–1.49)
Prothrombin Time: 25 seconds — ABNORMAL HIGH (ref 11.6–15.2)

## 2012-08-02 LAB — BASIC METABOLIC PANEL
Chloride: 106 mEq/L (ref 96–112)
Creatinine, Ser: 0.75 mg/dL (ref 0.50–1.35)
GFR calc Af Amer: >90 mL/min (ref >90)
GFR calc non Af Amer: 89 mL/min — ABNORMAL LOW (ref >90)
Potassium: 3.5 mEq/L (ref 3.5–5.1)

## 2012-08-02 MED ORDER — WARFARIN SODIUM 2.5 MG PO TABS
2.5000 mg | ORAL_TABLET | Freq: Once | ORAL | Status: AC
  Administered 2012-08-02: 2.5 mg via ORAL
  Filled 2012-08-02: qty 1

## 2012-08-02 MED ORDER — HYDROMORPHONE HCL PF 1 MG/ML IJ SOLN
4.0000 mg | INTRAMUSCULAR | Status: DC | PRN
  Administered 2012-08-02 – 2012-08-03 (×3): 2 mg via INTRAVENOUS
  Administered 2012-08-03 – 2012-08-05 (×10): 4 mg via INTRAVENOUS
  Filled 2012-08-02: qty 4
  Filled 2012-08-02: qty 3
  Filled 2012-08-02: qty 2
  Filled 2012-08-02 (×5): qty 4

## 2012-08-02 MED ORDER — HYDROMORPHONE HCL PF 2 MG/ML IJ SOLN
4.0000 mg | INTRAMUSCULAR | Status: DC | PRN
  Filled 2012-08-02 (×3): qty 4
  Filled 2012-08-02 (×2): qty 2
  Filled 2012-08-02: qty 4

## 2012-08-02 MED ORDER — VANCOMYCIN HCL 10 G IV SOLR
1250.0000 mg | Freq: Two times a day (BID) | INTRAVENOUS | Status: DC
  Administered 2012-08-02 – 2012-08-04 (×6): 1250 mg via INTRAVENOUS
  Filled 2012-08-02 (×9): qty 1250

## 2012-08-02 MED ORDER — LIDOCAINE HCL (PF) 1 % IJ SOLN
5.0000 mL | Freq: Once | INTRAMUSCULAR | Status: AC
  Administered 2012-08-02: 5 mL via INTRADERMAL

## 2012-08-02 MED ORDER — HYDROCODONE-ACETAMINOPHEN 5-325 MG PO TABS
1.0000 | ORAL_TABLET | ORAL | Status: DC | PRN
  Administered 2012-08-03 – 2012-08-04 (×3): 1 via ORAL
  Filled 2012-08-02 (×3): qty 1

## 2012-08-02 MED ORDER — LIDOCAINE HCL (PF) 1 % IJ SOLN
INTRAMUSCULAR | Status: AC
  Filled 2012-08-02: qty 5

## 2012-08-02 MED ORDER — HYDROMORPHONE HCL PF 2 MG/ML IJ SOLN
4.0000 mg | Freq: Once | INTRAMUSCULAR | Status: AC
  Administered 2012-08-02: 4 mg via INTRAVENOUS
  Filled 2012-08-02: qty 4

## 2012-08-02 NOTE — Consult Note (Signed)
Reason for Consult: Knee pain and swelling  Referring Physician: Dr. Drusilla Kanner is [name] 72 y.o. male.  HPI: Chief Complaint: Shortness of breath  HPI: Charles [name] is a 72 y.o. male with a history of lymphoma, neutropenia currently receiving Neulasta injections, atrial fibrillation, and recurrent pneumonia. Patient was in his usual state of health when over the past week he developed worsening cough. His wife reports that last night he began to develop fevers. He's feeling increasingly weak and short of breath. He reports the emergency room where he was noted to be febrile, hypoxic, chest x-ray indicated a left lower lobe pneumonia. Patient also reports that he twisted his knee while walking down some stairs. He is complaining of significant pain in that knee.    Patient indicates that his knee gives out frequently gave out in well he was carrying 2 bags he sat down got a after a brief period of rest and then subsequently had trouble with severe pain which is unrelieved by morphine but seems to be relieved by Tylenol the  Indicates that he takes some type of pain medication at home which may explain why he is having difficulty getting relief from morphine. However he is able to take by mouth medicines so he can start some by mouth pain medication as needed  Past Medical History  Diagnosis Date  . Hypertension   . Atrial fibrillation 10/2008  . Nonischemic cardiomyopathy 02/2009    Presented with CHF; EF of 25-30%; Nonobstructive ASCVD-worst lesion 40% on coronary angiography  . Peptic ulcer disease   . Degenerative joint disease     Knees and shoulders  . Pneumonia 2010  . Low TSH level     Borderline  . Allergic rhinitis   . Anxiety   . Drug abuse   . Tobacco abuse     50 pack years  . 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 Basal cell carcinoma     Past Surgical History  Procedure Laterality Date  . Neck lesion biopsy  June 24, 2010    Follicular lymphoma  . Skin  cancer excision      under left breast; variously described as melanoma and basal cell  . Rotator cuff repair      right  . Patella fracture surgery  1975  . Hematoma evacuation      after melanoma removal  . Portacath placement  07/12/2010  . Colonoscopy  2009    Also underwent [name] EGD; patient reports no significant findings  . Bone marrow aspiration  06/2012  . Bone marrow biopsy  06/2012  . Fungal infection      Family History  Problem Relation Age of Onset  . Heart attack Father   . Coronary artery disease Other     Social History:  reports that he quit smoking about 3 months ago. His smoking use included Cigarettes. He has a 50 pack-year smoking history. He does not have any smokeless tobacco history on file. He reports that he does not drink alcohol or use illicit drugs.  Allergies:  Allergies  Allergen Reactions  . Statins     Intolerant secondary to severe myalgias    Medications: I have reviewed the patient's current medications. Current facility-administered medications:0.9 %  sodium chloride infusion, , Intravenous, Continuous, Charles Blinks, MD, Last Rate: 100 mL/hr at 08/02/12 1000;  0.9 %  sodium chloride infusion, , Intravenous, Continuous, Charles Petrin, DO, Last Rate: 10 mL/hr at 07/31/12 0600;  acetaminophen (TYLENOL) tablet  650 mg, 650 mg, Oral, Q6H PRN, Charles Blinks, MD, 650 mg at 08/02/12 1014 ALPRAZolam (XANAX) tablet 0.5 mg, 0.5 mg, Oral, QID, Charles Blinks, MD, 0.5 mg at 08/01/12 2323;  antiseptic oral rinse (BIOTENE) solution 15 mL, 15 mL, Mouth Rinse, BID, Charles Petrin, DO, 15 mL at 08/02/12 0805;  carvedilol (COREG) tablet 25 mg, 25 mg, Oral, BID WC, Charles Stalling, MD, 25 mg at 08/02/12 0805;  ceFEPIme (MAXIPIME) 1 g in dextrose 5 % 50 mL IVPB, 1 g, Intravenous, Q8H, Charles Blinks, MD, 1 g at 08/02/12 0557 diltiazem (CARDIZEM SR) 12 hr capsule 120 mg, 120 mg, Oral, Q12H, Charles Stalling, MD, 120 mg at 08/02/12 1014;  guaiFENesin  (MUCINEX) 12 hr tablet 1,200 mg, 1,200 mg, Oral, BID, Charles Blinks, MD, 1,200 mg at 08/02/12 1014;  HYDROmorphone (DILAUDID) injection 4 mg, 4 mg, Intravenous, Q3H PRN, Charles Obey, MD;  HYDROmorphone (DILAUDID) injection 4 mg, 4 mg, Intravenous, Q3H PRN, Charles Obey, MD levofloxacin (LEVAQUIN) IVPB 750 mg, 750 mg, Intravenous, Q24H, Charles Blinks, MD, 750 mg at 08/01/12 1307;  ondansetron (ZOFRAN) injection 4 mg, 4 mg, Intravenous, Q6H PRN, Charles Blinks, MD;  pantoprazole (PROTONIX) EC tablet 40 mg, 40 mg, Oral, Daily, Charles Blinks, MD, 40 mg at 08/02/12 1014;  traMADol (ULTRAM) tablet 100 mg, 100 mg, Oral, QHS, Charles Petrin, DO, 100 mg at 08/02/12 0110 traMADol (ULTRAM) tablet 50 mg, 50 mg, Oral, Once, Charles Shan, MD;  vancomycin (VANCOCIN) 1,250 mg in sodium chloride 0.9 % 250 mL IVPB, 1,250 mg, Intravenous, Q12H, Charles Obey, MD;  warfarin (COUMADIN) tablet 2.5 mg, 2.5 mg, Oral, Once, Charles Obey, MD;  Warfarin - Pharmacist Dosing Inpatient, , Does not apply, Q24H, Charles Blinks, MD Facility-Administered Medications Ordered in Other Encounters: sodium chloride 0.9 % injection 10 mL, 10 mL, Intracatheter, PRN, Charles An, MD, 10 mL at 10/15/10 1230  Results for orders placed during the hospital encounter of 07/30/12 (from the past 48 hour(s))  PROTIME-INR     Status: Abnormal   Collection Time    08/01/12  4:57 AM      Result Value Range   Prothrombin Time 21.5 (*) 11.6 - 15.2 seconds   INR 1.95 (*) 0.00 - 1.49  BASIC METABOLIC PANEL     Status: Abnormal   Collection Time    08/01/12  4:57 AM      Result Value Range   Sodium 133 (*) 135 - 145 mEq/L   Potassium 4.0  3.5 - 5.1 mEq/L   Chloride 101  96 - 112 mEq/L   CO2 22  19 - 32 mEq/L   Glucose, Bld 111 (*) 70 - 99 mg/dL   BUN 16  6 - 23 mg/dL   Creatinine, Ser 1.61  0.50 - 1.35 mg/dL   Calcium 7.2 (*) 8.4 - 10.5 mg/dL   GFR calc non Af Amer 83 (*) >90 mL/min   GFR calc Af Amer >90   >90 mL/min   Comment:            The eGFR has been calculated     using the CKD EPI equation.     This calculation has not been     validated in all clinical     situations.     eGFR's persistently     <90 mL/min signify     possible Chronic Kidney Disease.  DIFFERENTIAL     Status: Abnormal   Collection Time    08/01/12  4:57 AM      Result Value Range   Neutrophils Relative 92 (*) 43 - 77 %   Neutro Abs 13.6 (*) 1.7 - 7.7 K/uL   Lymphocytes Relative 4 (*) 12 - 46 %   Lymphs Abs 0.6 (*) 0.7 - 4.0 K/uL   Monocytes Relative 4  3 - 12 %   Monocytes Absolute 0.6  0.1 - 1.0 K/uL   Eosinophils Relative 0  0 - 5 %   Eosinophils Absolute 0.0  0.0 - 0.7 K/uL   Basophils Relative 0  0 - 1 %   Basophils Absolute 0.0  0.0 - 0.1 K/uL  CBC     Status: Abnormal   Collection Time    08/01/12  4:57 AM      Result Value Range   WBC 14.9 (*) 4.0 - 10.5 K/uL   RBC 3.17 (*) 4.22 - 5.81 MIL/uL   Hemoglobin 8.8 (*) 13.0 - 17.0 g/dL   HCT 16.1 (*) 09.6 - 04.5 %   MCV 84.9  78.0 - 100.0 fL   MCH 27.8  26.0 - 34.0 pg   MCHC 32.7  30.0 - 36.0 g/dL   RDW 40.9 (*) 81.1 - 91.4 %   Platelets 157  150 - 400 K/uL  MAGNESIUM     Status: None   Collection Time    08/01/12  4:57 AM      Result Value Range   Magnesium 2.1  1.5 - 2.5 mg/dL  CULTURE, EXPECTORATED SPUTUM-ASSESSMENT     Status: None   Collection Time    08/01/12  1:01 PM      Result Value Range   Specimen Description SPU     Special Requests NONE     Sputum evaluation       Value: MICROSCOPIC FINDINGS SUGGEST THAT THIS SPECIMEN IS NOT REPRESENTATIVE OF LOWER RESPIRATORY SECRETIONS. PLEASE RECOLLECT.     Gram Stain Report Called to,Read Back By and Verified With: WARD, S. AT 15:03PM ON 08/01/12 BY PRUITT, C.   Report Status 08/01/2012 FINAL    VANCOMYCIN, TROUGH     Status: None   Collection Time    08/01/12  9:33 PM      Result Value Range   Vancomycin Tr 11.8  10.0 - 20.0 ug/mL  PROTIME-INR     Status: Abnormal   Collection Time     08/02/12  4:55 AM      Result Value Range   Prothrombin Time 25.0 (*) 11.6 - 15.2 seconds   INR 2.39 (*) 0.00 - 1.49  BASIC METABOLIC PANEL     Status: Abnormal   Collection Time    08/02/12  4:55 AM      Result Value Range   Sodium 137  135 - 145 mEq/L   Potassium 3.5  3.5 - 5.1 mEq/L   Chloride 106  96 - 112 mEq/L   CO2 21  19 - 32 mEq/L   Glucose, Bld 113 (*) 70 - 99 mg/dL   BUN 14  6 - 23 mg/dL   Creatinine, Ser 7.82  0.50 - 1.35 mg/dL   Calcium 7.0 (*) 8.4 - 10.5 mg/dL   GFR calc non Af Amer 89 (*) >90 mL/min   GFR calc Af Amer >90  >90 mL/min   Comment:            The eGFR has been calculated     using the CKD EPI equation.     This calculation has not been  validated in all clinical     situations.     eGFR's persistently     <90 mL/min signify     possible Chronic Kidney Disease.  CBC     Status: Abnormal   Collection Time    08/02/12  4:55 AM      Result Value Range   WBC 11.3 (*) 4.0 - 10.5 K/uL   RBC 3.00 (*) 4.22 - 5.81 MIL/uL   Hemoglobin 8.6 (*) 13.0 - 17.0 g/dL   HCT 78.2 (*) 95.6 - 21.3 %   MCV 84.7  78.0 - 100.0 fL   MCH 28.7  26.0 - 34.0 pg   MCHC 33.9  30.0 - 36.0 g/dL   RDW 08.6 (*) 57.8 - 46.9 %   Platelets 151  150 - 400 K/uL  DIFFERENTIAL     Status: Abnormal   Collection Time    08/02/12  4:55 AM      Result Value Range   Neutrophils Relative 90 (*) 43 - 77 %   Neutro Abs 10.2 (*) 1.7 - 7.7 K/uL   Lymphocytes Relative 5 (*) 12 - 46 %   Lymphs Abs 0.5 (*) 0.7 - 4.0 K/uL   Monocytes Relative 4  3 - 12 %   Monocytes Absolute 0.5  0.1 - 1.0 K/uL   Eosinophils Relative 0  0 - 5 %   Eosinophils Absolute 0.0  0.0 - 0.7 K/uL   Basophils Relative 0  0 - 1 %   Basophils Absolute 0.0  0.0 - 0.1 K/uL    No results found.  Review of Systems  Unable to perform ROS: other  Respiratory: Positive for cough.    Blood pressure 103/63, pulse 70, temperature 98.8 F (37.1 C), temperature source Oral, resp. rate 25, height 5\' 7"  (1.702 m),  weight 210 lb 5.1 oz (95.4 kg), SpO2 90.00%. Physical Exam  Vitals reviewed. Constitutional: He appears well-developed and well-nourished. No distress.  HENT:  Head: Normocephalic.  Eyes: Conjunctivae are normal.  Neck: Neck supple.  Cardiovascular: Intact distal pulses.   Venous stasis changes of the skin but no edema  Respiratory:  Wearing oxygen breathing seems slightly labored  GI: Soft.  Lymphadenopathy:    He has no cervical adenopathy.  Neurological: He exhibits normal muscle tone.  He is alert he is awake he is oriented to place and person  Skin: Skin is warm and dry. He is not diaphoretic.  Pigmented skin in the lower legs secondary to venous stasis disease  Psychiatric: He has a normal mood and affect. His behavior is normal. Thought content normal.  Question slight confusion inhibiting full history including review of systems   musculoskeletal As far as his upper extremities he has no contracture subluxation atrophy or tremor or malalignment  Right knee has a slight effusion as well no warmth or redness no erythema and slight flexion contracture flexion of his right knee past 90. Stability confirmed by anterior drawer posterior drawer and collateral ligament test. Joint lines mildly tender. Muscle tone is normal. No atrophy.  Left knee joint effusion with warmth no erythema joint line tenderness medial lateral Active knee flexion 90 extension with slight flexion contracture less than 10 Transverse incision from previous patellectomy Muscle tone is normal Ligament test confirm stability of the knee although range of motion of the knee was painful  Assessment/Plan: X-rays show a valgus knee with severe osteoarthritis and joint effusion there is changes consistent with CPPD  Previous patellectomy me with long-standing instability of  the knee and giving way episodes  Aspiration has been performed culture sensitivities and Gram stain pending  Knee brace with the scope  10-90  Weightbearing can be as tolerated in the brace  Charles [name] 08/02/2012, 10:58 AM

## 2012-08-02 NOTE — Progress Notes (Signed)
Dr [name] paged and made aware that pt is still having a lot of pain despite pain medication. Pt rates pain 10/10. Morphine and Tylenol have been given. Ice applied to area and leg elevated.  Order received to give 4 mg of IV Dilaudid.   Will continue to monitor pt.

## 2012-08-02 NOTE — Progress Notes (Signed)
ANTICOAGULATION CONSULT NOTE  Pharmacy Consult for Warfarin Indication: atrial fibrillation  Allergies  Allergen Reactions  . Statins     Intolerant secondary to severe myalgias   Patient Measurements: Height: 5\' 7"  (170.2 cm) Weight: 210 lb 5.1 oz (95.4 kg) IBW/kg (Calculated) : 66.1  Vital Signs: Temp: 99 F (37.2 C) (05/12 0400) Temp src: Oral (05/12 0400) BP: 104/57 mmHg (05/12 0400) Pulse Rate: 103 (05/12 0400)  Labs:  Recent Labs  07/31/12 0512 08/01/12 0457 08/02/12 0455  HGB 9.4* 8.8* 8.6*  HCT 27.8* 26.9* 25.4*  PLT 154 157 151  LABPROT 23.5* 21.5* 25.0*  INR 2.20* 1.95* 2.39*  CREATININE 1.02 0.88 0.75   Estimated Creatinine Clearance: 90.5 ml/min (by C-G formula based on Cr of 0.75).  Medical History: Past Medical History  Diagnosis Date  . Hypertension   . Atrial fibrillation 10/2008  . Nonischemic cardiomyopathy 02/2009    Presented with CHF; EF of 25-30%; Nonobstructive ASCVD-worst lesion 40% on coronary angiography  . Peptic ulcer disease   . Degenerative joint disease     Knees and shoulders  . Pneumonia 2010  . Low TSH level     Borderline  . Allergic rhinitis   . Anxiety   . Drug abuse   . Tobacco abuse     50 pack years  . 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 Basal cell carcinoma    Medications:  Prescriptions prior to admission  Medication Sig Dispense Refill  . ALPRAZolam (XANAX) 1 MG tablet Take 0.5 mg by mouth 4 (four) times daily.       . carvedilol (COREG) 25 MG tablet Take 25 mg by mouth 2 (two) times daily. 1/2 tablet if BP is below 10/70       . diltiazem (CARDIZEM CD) 300 MG 24 hr capsule Take 1 capsule (300 mg total) by mouth daily.  30 capsule  6  . HYDROcodone-acetaminophen (NORCO/VICODIN) 5-325 MG per tablet Take 1 tablet by mouth every 6 (six) hours as needed for pain.       Marland Kitchen lovastatin (MEVACOR) 10 MG tablet Take 10 mg by mouth daily.       . Multiple Vitamins-Iron (ONE-TABLET-DAILY/IRON PO) Take 1 tablet by mouth daily.       Marland Kitchen omeprazole (PRILOSEC) 20 MG capsule Take 20 mg by mouth daily.      . pegfilgrastim (NEULASTA) 6 MG/0.6ML injection Inject 6 mg into the skin every 14 (fourteen) days. For a total of 4 doses.      Marland Kitchen PROAIR HFA 108 (90 BASE) MCG/ACT inhaler Inhale 1 puff into the lungs every 6 (six) hours as needed. For shortness of breath      . traMADol (ULTRAM) 50 MG tablet Take 50-100 mg by mouth 4 (four) times daily as needed. For pain      . warfarin (COUMADIN) 5 MG tablet Take 0.5-1 tablets (2.5-5 mg total) by mouth daily. Pt takes 2.5mg  on  sun, tue, thu , sat and 5 mg all other days  30 tablet  3  . denosumab (PROLIA) 60 MG/ML SOLN injection Inject 60 mg into the skin every 6 (six) months. Administer in upper arm, thigh, or abdomen      . furosemide (LASIX) 40 MG tablet Take 20-40 mg by mouth daily as needed (for fluid retention). Pt can take a half to 1 tab as needed for fluid retention      . potassium chloride SA (K-DUR,KLOR-CON) 20 MEQ tablet Take 20 mEq by mouth daily as needed (takes  along with lasix).        Assessment: Okay for Protocol Recent elevated INR (4.4) prior to admission and dose reduced to above regimen.  Pt is on Levaquin x 3 doses which could potentially interact with Warfarin.  INR is therapeutic today.  Goal of Therapy:  INR 2-3   Plan:  Warfarin 2.5mg  PO x 1 today. Daily PT/INR  Valrie Hart A 08/02/2012,8:55 AM

## 2012-08-02 NOTE — Progress Notes (Signed)
ANTIBIOTIC CONSULT NOTE   Pharmacy Consult for Vancomycin Indication: pneumonia  Allergies  Allergen Reactions  . Statins     Intolerant secondary to severe myalgias   Patient Measurements: Height: 5\' 7"  (170.2 cm) Weight: 210 lb 5.1 oz (95.4 kg) IBW/kg (Calculated) : 66.1  Vital Signs: Temp: 99 F (37.2 C) (05/12 0400) Temp src: Oral (05/12 0400) BP: 104/57 mmHg (05/12 0400) Pulse Rate: 103 (05/12 0400) Intake/Output from previous day: 05/11 0701 - 05/12 0700 In: 3150 [P.O.:1050; I.V.:1700; IV Piggyback:400] Out: 3626 [Urine:3625; Stool:1] Intake/Output from this shift:    Labs:  Recent Labs  07/31/12 0512 08/01/12 0457 08/02/12 0455  WBC 16.9* 14.9* 11.3*  HGB 9.4* 8.8* 8.6*  PLT 154 157 151  CREATININE 1.02 0.88 0.75   Estimated Creatinine Clearance: 90.5 ml/min (by C-G formula based on Cr of 0.75).  Recent Labs  08/01/12 2133  VANCOTROUGH 11.8    Microbiology: Recent Results (from the past 720 hour(s))  CULTURE, BLOOD (ROUTINE X 2)     Status: None   Collection Time    07/30/12 12:30 PM      Result Value Range Status   Specimen Description BLOOD LEFT ARM   Final   Special Requests BOTTLES DRAWN AEROBIC AND ANAEROBIC  8 CC EACH   Final   Culture  Setup Time 07/31/2012 19:44   Final   Culture     Final   Value: GROUP [name] STREP (S.PYOGENES) ISOLATED     Note: Gram Stain Report Called to,Read Back By and Verified With: SMITH J @ 0150 07/31/12 BY Cristela Blue J Performed at [facility] CRITICAL RESULT CALLED TO, READ BACK BY AND VERIFIED WITH: SHARON WARD 08/01/12 @ 10:04AM BY RUSCA.   Report Status 08/01/2012 FINAL   Final  CULTURE, BLOOD (ROUTINE X 2)     Status: None   Collection Time    07/30/12 12:45 PM      Result Value Range Status   Specimen Description BLOOD RIGHT ARM   Final   Special Requests BOTTLES DRAWN AEROBIC AND ANAEROBIC  6 CC EACH   Final   Culture  Setup Time 07/31/2012 02:50   Final   Culture     Final   Value: GROUP [name] STREP  (S.PYOGENES) ISOLATED     Note: Gram Stain Report Called to,Read Back By and Verified With: SMITH J @ 0150 07/31/12 BY Cristela Blue J Performed at [facility] CRITICAL RESULT CALLED TO, READ BACK BY AND VERIFIED WITH: SHARON WARD 08/01/12 @ 10:04AM BY RUSCA.   Report Status 08/01/2012 FINAL   Final  MRSA PCR SCREENING     Status: None   Collection Time    07/30/12  7:22 PM      Result Value Range Status   MRSA by PCR NEGATIVE  NEGATIVE Final   Comment:            The GeneXpert MRSA Assay (FDA     approved for NASAL specimens     only), is one component of [name]     comprehensive MRSA colonization     surveillance program. It is not     intended to diagnose MRSA     infection nor to guide or     monitor treatment for     MRSA infections.  CULTURE, EXPECTORATED SPUTUM-ASSESSMENT     Status: None   Collection Time    08/01/12  1:01 PM      Result Value Range Status   Specimen Description SPU  Final   Special Requests NONE   Final   Sputum evaluation     Final   Value: MICROSCOPIC FINDINGS SUGGEST THAT THIS SPECIMEN IS NOT REPRESENTATIVE OF LOWER RESPIRATORY SECRETIONS. PLEASE RECOLLECT.     Gram Stain Report Called to,Read Back By and Verified With: WARD, S. AT 15:03PM ON 08/01/12 BY PRUITT, C.   Report Status 08/01/2012 FINAL   Final   Medical History: Past Medical History  Diagnosis Date  . Hypertension   . Atrial fibrillation 10/2008  . Nonischemic cardiomyopathy 02/2009    Presented with CHF; EF of 25-30%; Nonobstructive ASCVD-worst lesion 40% on coronary angiography  . Peptic ulcer disease   . Degenerative joint disease     Knees and shoulders  . Pneumonia 2010  . Low TSH level     Borderline  . Allergic rhinitis   . Anxiety   . Drug abuse   . Tobacco abuse     50 pack years  . 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 Basal cell carcinoma    Medications:  Scheduled:  . ALPRAZolam  0.5 mg Oral QID  . antiseptic oral rinse  15 mL Mouth Rinse BID  . [COMPLETED] carvedilol   12.5 mg Oral Once  . carvedilol  25 mg Oral BID WC  . ceFEPime (MAXIPIME) IV  1 g Intravenous Q8H  . diltiazem  120 mg Oral Q12H  . guaiFENesin  1,200 mg Oral BID  . [COMPLETED] HYDROcodone-acetaminophen  1 tablet Oral Once  . levofloxacin (LEVAQUIN) IV  750 mg Intravenous Q24H  . pantoprazole  40 mg Oral Daily  . [COMPLETED] sodium chloride  500 mL Intravenous Once  . traMADol  100 mg Oral QHS  . traMADol  50 mg Oral Once  . vancomycin  1,000 mg Intravenous Q12H  . [COMPLETED] warfarin  5 mg Oral ONCE-1800  . Warfarin - Pharmacist Dosing Inpatient   Does not apply Q24H  . [DISCONTINUED] carvedilol  12.5 mg Oral BID WC   Assessment: 72yo male admitted with suspected pna.  Pt has good renal fxn.  Estimated Creatinine Clearance: 90.5 ml/min (by C-G formula based on Cr of 0.75).  Trough level is below goal.  Renal fxn has been stable.  Goal of Therapy:  Vancomycin trough level 15-20 mcg/ml  Plan: Increase Vancomycin to 1250mg  IV q12hrs Check trough level weekly while on Vancomycin Check SCr at least twice weekly while on Vancomycin Monitor labs, renal fxn, and cultures Duration of therapy per MD (anticipate 8 days total per guidelines)  Charles [name], Charles [name] 08/02/2012,8:51 AM

## 2012-08-02 NOTE — Progress Notes (Signed)
Dr [name] at bedside. Dr [name] removed fluid from pts left knee at the bedside. Pt numbed with lidocaine, and Dr [name] pulled off about 15-20 cc's of serous looking fluid. Specimen sent down to lab for tests.

## 2012-08-02 NOTE — Progress Notes (Signed)
NAME:  Charles [name], Charles [name]             ACCOUNT NO.:  [id number]  MEDICAL RECORD NO.:  [id number]  LOCATION:                                 FACILITY:  PHYSICIAN:  Melvyn Novas, MDDATE OF BIRTH:  1940-12-22  DATE OF PROCEDURE:  08/01/2012 DATE OF DISCHARGE:                                PROGRESS NOTE   The  patient has gram-positive cocci in chains septicemia, currently on 3 antibiotics vancomycin, Primaxin, and Levaquin, defervesced clinically, no longer febrile, has a swollen knee, consideration given to septic arthritis.  Awaiting orthopedic consult.  However, knee has no evidence of rubor or tenderness which speaks against septic arthritis. He does have a right lower lobe pneumonia.  Has echo of 2012 reveals an ejection fraction of 50-55%, better than anticipated yesterday.  Blood pressure is 100/54, pulse is 102 and irregularly irregular AFib, temperature 98.1, respiratory rate is 23.  Hemoglobin drifting down from 10.3-8.8, no clinical bleeding.  This could be partially dilutional.  We will monitor hemoglobin.  WBC is down from 19 to 14,000, creatinine 0.88, INR 1.95.  Lungs show diminished breath sounds at the bases. Prolonged expiratory phase.  Heart, irregularly regular, no S3.  Abdomen is soft, nontender.  Bowel sounds are normoactive.  PLAN:  Right now is to give 500 mL fluid boluses __________ blood pressure, stopped IV Cardizem, continue p.o. Cardizem 120 q.12, increase carvedilol to 25 p.o. b.i.d. to decrease heart rate and increased diastolic filling, __________ cardiac output.  Monitor renal function, CBC, and continue antibiotics.  Awaiting orthopedic consult.     Melvyn Novas, MD     RMD/MEDQ  D:  08/01/2012  T:  08/01/2012  Job:  [id number]

## 2012-08-02 NOTE — Progress Notes (Signed)
UR Chart Review Completed  

## 2012-08-02 NOTE — Evaluation (Addendum)
Physical Therapy Evaluation Patient Details Name: Charles [name] MRN: [id number] DOB: [birth year] Today's Date: 08/02/2012 Time: 6295-2841 PT Time Calculation (min): 26 min  PT Assessment / Plan / Recommendation Clinical Impression  Pt was seen today for the fitting of a [place] brace to the L knee.  His left knee is very edemetous with decreased ROM following an injury to it last week.  Currently, he is not having any pain.  The brace was fitted to him and he was instructed in its' application.  It is comfortable to him at this time.  We will check on him in the morning to ensure proper fit and see if he can walk with it on.  He declined to try to walk this afternoon.    PT Assessment  Patient needs continued PT services    Follow Up Recommendations  No PT follow up    Does the patient have the potential to tolerate intense rehabilitation      Barriers to Discharge        Equipment Recommendations  None recommended by PT    Recommendations for Other Services     Frequency Min 1X/week    Precautions / Restrictions T Sc[place] brace to be worn for all gait  Pertinent Vitals/Pain      Mobility  Bed Mobility Bed Mobility: Not assessed Details for Bed Mobility Assistance: pt states that he does not want to get OOB right now due to recent withdrawal of knee fluid    Exercises     PT Diagnosis: Difficulty walking  PT Problem List: Decreased knowledge of use of DME PT Treatment Interventions: Patient/family education   PT Goals Acute Rehab PT Goals PT Goal Formulation: With patient Pt will Ambulate: 16 - 50 feet;with supervision;with least restrictive assistive device PT Goal: Ambulate - Progress: Goal set today Additional Goals Additional Goal #1: Pt is tolerating T Sc[place] brace well and understands how to don and doff the brace...  Visit Information  Last PT Received On: 08/02/12    Subjective Data  Subjective: He c/o left knee pain for the past several days...doesn't  know how he hurt it Patient Stated Goal: pain free left knee   Prior Functioning  Home Living Lives With: Spouse;Family Available Help at Discharge: Family;Available 24 hours/day Type of Home: Mobile home Home Access: Ramped entrance Home Layout: One level Bathroom Shower/Tub: Engineer, manufacturing systems: Standard Home Adaptive Equipment: Environmental consultant - four wheeled;Shower chair with back;Grab bars in shower Prior Function Level of Independence: Independent with assistive device(s) Able to Take Stairs?: Yes Driving: No Vocation: Retired Musician: No difficulties    Copywriter, advertising Arousal/Alertness: Awake/alert Behavior During Therapy: WFL for tasks assessed/performed Overall Cognitive Status: Within Functional Limits for tasks assessed    Extremity/Trunk Assessment Right Lower Extremity Assessment RLE ROM/Strength/Tone: Within functional levels Left Lower Extremity Assessment LLE ROM/Strength/Tone: Deficits LLE ROM/Strength/Tone Deficits: left knee ROM approximately 25-60 degrees...knee is very edemetous LLE Sensation: WFL - Light Touch   Balance    End of Session    GP     Konrad Penta 08/02/2012, 4:12 PM

## 2012-08-03 LAB — BASIC METABOLIC PANEL
BUN: 12 mg/dL (ref 6–23)
CO2: 20 mEq/L (ref 19–32)
Calcium: 6.9 mg/dL — ABNORMAL LOW (ref 8.4–10.5)
Chloride: 104 mEq/L (ref 96–112)
Creatinine, Ser: 0.75 mg/dL (ref 0.50–1.35)
Glucose, Bld: 109 mg/dL — ABNORMAL HIGH (ref 70–99)

## 2012-08-03 LAB — PROTIME-INR: INR: 2.87 — ABNORMAL HIGH (ref 0.00–1.49)

## 2012-08-03 MED ORDER — FILGRASTIM 480 MCG/1.6ML IJ SOLN
480.0000 ug | Freq: Every day | INTRAMUSCULAR | Status: DC
  Administered 2012-08-03 – 2012-08-04 (×2): 480 ug via SUBCUTANEOUS
  Filled 2012-08-03 (×3): qty 1.6

## 2012-08-03 MED ORDER — WARFARIN SODIUM 1 MG PO TABS
1.0000 mg | ORAL_TABLET | Freq: Once | ORAL | Status: AC
  Administered 2012-08-03: 1 mg via ORAL
  Filled 2012-08-03: qty 1

## 2012-08-03 MED ORDER — CALCIUM CITRATE 950 (200 CA) MG PO TABS
200.0000 mg | ORAL_TABLET | Freq: Two times a day (BID) | ORAL | Status: DC
  Administered 2012-08-03 – 2012-08-05 (×5): 200 mg via ORAL
  Filled 2012-08-03 (×7): qty 1

## 2012-08-03 NOTE — Clinical Social Work Placement (Signed)
Clinical Social Work Department CLINICAL SOCIAL WORK PLACEMENT NOTE 08/03/2012  Patient:  Charles [name], Charles [name]  Account Number:  [id number] Admit date:  07/30/2012  Clinical Social Worker:  Derenda Fennel, LCSW  Date/time:  08/03/2012 02:30 PM  Clinical Social Work is seeking post-discharge placement for this patient at the following level of care:   SKILLED NURSING   (*CSW will update this form in Epic as items are completed)   08/03/2012  Patient/family provided with [facility] Department of Clinical Social Work's list of facilities offering this level of care within the geographic area requested by the patient (or if unable, by the patient's family).  08/03/2012  Patient/family informed of their freedom to choose among providers that offer the needed level of care, that participate in Medicare, Medicaid or managed care program needed by the patient, have an available bed and are willing to accept the patient.  08/03/2012  Patient/family informed of [facility]' ownership interest in [facility], as well as of the fact that they are under no obligation to receive care at this facility.  PASARR submitted to EDS on 08/03/2012 PASARR number received from EDS on   FL2 transmitted to all facilities in geographic area requested by pt/family on  08/03/2012 FL2 transmitted to all facilities within larger geographic area on   Patient informed that his/her managed care company has contracts with or will negotiate with  certain facilities, including the following:     Patient/family informed of bed offers received:   Patient chooses bed at  Physician recommends and patient chooses bed at    Patient to be transferred to  on   Patient to be transferred to facility by   The following physician request were entered in Epic:   Additional Comments:  Derenda Fennel, LCSW [phone]

## 2012-08-03 NOTE — Progress Notes (Signed)
Report called to RN. Pt to be transferred to room 218 per MD order. Pts wife called and made aware. Pt transferred via bed with personal belongings.

## 2012-08-03 NOTE — Progress Notes (Signed)
ANTICOAGULATION CONSULT NOTE  Pharmacy Consult for Warfarin Indication: atrial fibrillation  Allergies  Allergen Reactions  . Statins     Intolerant secondary to severe myalgias   Patient Measurements: Height: 5\' 7"  (170.2 cm) Weight: 212 lb 4.9 oz (96.3 kg) IBW/kg (Calculated) : 66.1  Vital Signs: Temp: 98.4 F (36.9 C) (05/13 0400) Temp src: Oral (05/13 0400) BP: 92/57 mmHg (05/13 0900) Pulse Rate: 94 (05/13 0900)  Labs:  Recent Labs  08/01/12 0457 08/02/12 0455 08/03/12 0447  HGB 8.8* 8.6*  --   HCT 26.9* 25.4*  --   PLT 157 151  --   LABPROT 21.5* 25.0* 28.6*  INR 1.95* 2.39* 2.87*  CREATININE 0.88 0.75 0.75   Estimated Creatinine Clearance: 91 ml/min (by C-G formula based on Cr of 0.75).  Medical History: Past Medical History  Diagnosis Date  . Hypertension   . Atrial fibrillation 10/2008  . Nonischemic cardiomyopathy 02/2009    Presented with CHF; EF of 25-30%; Nonobstructive ASCVD-worst lesion 40% on coronary angiography  . Peptic ulcer disease   . Degenerative joint disease     Knees and shoulders  . Pneumonia 2010  . Low TSH level     Borderline  . Allergic rhinitis   . Anxiety   . Drug abuse   . Tobacco abuse     50 pack years  . 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 Basal cell carcinoma    Medications:  Prescriptions prior to admission  Medication Sig Dispense Refill  . ALPRAZolam (XANAX) 1 MG tablet Take 0.5 mg by mouth 4 (four) times daily.       . carvedilol (COREG) 25 MG tablet Take 25 mg by mouth 2 (two) times daily. 1/2 tablet if BP is below 10/70       . diltiazem (CARDIZEM CD) 300 MG 24 hr capsule Take 1 capsule (300 mg total) by mouth daily.  30 capsule  6  . HYDROcodone-acetaminophen (NORCO/VICODIN) 5-325 MG per tablet Take 1 tablet by mouth every 6 (six) hours as needed for pain.       Marland Kitchen lovastatin (MEVACOR) 10 MG tablet Take 10 mg by mouth daily.       . Multiple Vitamins-Iron (ONE-TABLET-DAILY/IRON PO) Take 1 tablet by mouth daily.       Marland Kitchen omeprazole (PRILOSEC) 20 MG capsule Take 20 mg by mouth daily.      . pegfilgrastim (NEULASTA) 6 MG/0.6ML injection Inject 6 mg into the skin every 14 (fourteen) days. For a total of 4 doses.      Marland Kitchen PROAIR HFA 108 (90 BASE) MCG/ACT inhaler Inhale 1 puff into the lungs every 6 (six) hours as needed. For shortness of breath      . traMADol (ULTRAM) 50 MG tablet Take 50-100 mg by mouth 4 (four) times daily as needed. For pain      . warfarin (COUMADIN) 5 MG tablet Take 0.5-1 tablets (2.5-5 mg total) by mouth daily. Pt takes 2.5mg  on  sun, tue, thu , sat and 5 mg all other days  30 tablet  3  . denosumab (PROLIA) 60 MG/ML SOLN injection Inject 60 mg into the skin every 6 (six) months. Administer in upper arm, thigh, or abdomen      . furosemide (LASIX) 40 MG tablet Take 20-40 mg by mouth daily as needed (for fluid retention). Pt can take a half to 1 tab as needed for fluid retention      . potassium chloride SA (K-DUR,KLOR-CON) 20 MEQ tablet Take 20 mEq  by mouth daily as needed (takes along with lasix).        Assessment: 72 yo M on chronic warfarin for Afib.  PTA, dose was recently reduced to above regimen for elevated INR.   INR is within goal range today but is trending to upper end of goal range despite dose decrease yesterday.  No bleeding noted.   Goal of Therapy:  INR 2-3   Plan:  Warfarin 1mg  PO x 1 today. Daily PT/INR  Elson Clan 08/03/2012,11:13 AM

## 2012-08-03 NOTE — Progress Notes (Signed)
NAMEMarland [name]  CADDEN, ELIZONDO NO.:  [id number]  MEDICAL RECORD NO.:  [id number]  LOCATION:  IC05                          FACILITY:  [facility]  PHYSICIAN:  Mila Homer. [name], M.D.DATE OF BIRTH:  [birth year]  DATE OF PROCEDURE: DATE OF DISCHARGE:                                PROGRESS NOTE   SUBJECTIVE:  This 72 year old presented to the hospital with severe pain in his left knee.  He had fever and abnormal chest x-ray and was felt to have pneumonia along with the left knee pain.  His wife said he actually wrenched the left knee a couple days before came to the hospital 2 days ago and he had swelling after this.  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  He has had lymphoma and has had a number of episodes of pneumonia recently.  He has a chronically decreased white cell count.  He is followed by Dr. Mariel Sleet, oncologist.  OBJECTIVE:  VITAL SIGNS:  Temperature is 98.8, pulse 103, respiratory rate 29, blood pressure 97/58. GENERAL:  He is in the ICU.  He is in severe pain from his left knee, which is swollen.  He has had 2 mg of morphine IV an hour ago and is still in such pain he is ready to check out of the hospital. LUNGS:  Actually sound fairly clear throughout and he is moving air well and his color is good. HEART:  Regular rhythm.  Rate of 70. EXTREMITIES:  He has got diffuse swelling of the left knee, but particularly noted in the medial aspect of the knee.  Blood tests show 2 out of 2 blood cultures positive for group A strep. His white cell count is 11,300, down from 16,900 on admission, but his hemoglobin is 8.6.  His BMP is essentially normal.  His INR is 2.39.  ASSESSMENT: 1. I believe he has an infected left knee, but it also might be     hemarthrosis. 2. Recurrent pneumonia. 3. Lymphoma. 4. Severe degenerative changes of the knee based on his x-ray. 5. Anticoagulation.  PLAN:   Under sterile conditions using Betadine to cleanse the medial aspect of left knee, I anesthetized with 1% local lidocaine and then removed about 5 cc of a somewhat turbulent clearish to yellowish fluid from the knee.  This was sent to the lab for culture and sensitivity and Gram stain.  His wife was in the room at the time of the procedure in the ICU.  Dr. Valentina Shaggy, orthopedist, has been called to see the patient. Believe the patient may need further procedures on this left knee, which may need to be done in the OR.     Mila Homer. [name], M.D.     SDK/MEDQ  D:  08/02/2012  T:  08/03/2012  Job:  [id number]

## 2012-08-03 NOTE — Clinical Documentation Improvement (Signed)
RESPIRATORY FAILURE DOCUMENTATION CLARIFICATION QUERY   THIS DOCUMENT IS NOT A PERMANENT PART OF THE MEDICAL RECORD  TO RESPOND TO THE THIS QUERY, FOLLOW THE INSTRUCTIONS BELOW:  1. If needed, update documentation for the patient's encounter via the notes activity.  2. Access this query again and click edit on the In [employer].  3. After updating, or not, click F2 to complete all highlighted (required) fields concerning your review. Select "additional documentation in the medical record" OR "no additional documentation provided".  4. Click Sign note button.  5. The deficiency will fall out of your In Basket *Please let us know if you are not able to complete this workflow by phone or e-mail (listed below).  Please update your documentation within the medical record to reflect your response to this query.                                                                                    08/03/12  Dear Dr.Fitz Matsuo Marton Redwood,  In a better effort to capture your patient's severity of illness, reflect appropriate length of stay and utilization of resources, a review of the patient medical record has revealed the following indicators.   Based on your clinical judgment, please clarify and document in a progress note and/or discharge summary the clinical condition associated with the following supporting information: In responding to this query please exercise your independent judgment.  The fact that a query is asked, does not imply that any particular answer is desired or expected.  Please clarify respiratory status. Thank you.  Possible Clinical Conditions?  Acute Respiratory Failure Acute on Chronic Respiratory Failure Chronic Respiratory Failure Other Condition________________ Cannot Clinically Determine    Supporting Information:  Risk Factors: Admitted with pneumonia and sepsis Hypoxia Nonischemic cardiomyopathy  Smoker  Signs&Symptoms: Shortness of  breath Weak Cough Respirations up to 29/min O2 sats on room air = 85% On 2L =91%  Diagnostics:  Radiology:IMPRESSION:  Hazy infiltrate/pneumonia left lower lobe posteriorly. Follow-up  to resolution after treatment is recommended.    Treatment: Oxygen: currently on 3 L on simple mask Keep O2 >92% Elevate head of bed Turn, cough, deep breathe, incentive spirometry q2h while awake  You may use possible, probable, or suspect with inpatient documentation. possible, probable, suspected diagnoses MUST be documented at the time of discharge  Reviewed: This patient had pneumonia but not respiratory failure. Thank you. [name] M.D.  Thank You,  Harless Litten RN, MSN Clinical Documentation Specialist: Office# [phone] [facility] Health Information Management [place]

## 2012-08-03 NOTE — Progress Notes (Signed)
Physical Therapy Treatment Patient Details Name: Charles [name] MRN: [id number] DOB: 06-11-1940 Today's Date: 08/03/2012 Time: 7829-5621 PT Time Calculation (min): 21 min  PT Assessment / Plan / Recommendation Comments on Treatment Session  Pt was seen for evaluation of mobility while using knee brace.  He was up in a recliner chair at the time of my arrival by nursing service.  They described max assist of 2 to get him bed to chair.  Pt needed total assist of 1 to ttransfer sit to stand.  He was able to stand with a walker and slowly pivot chair to bed with max assist.  He was very fearful and moved extremely slowly.Marland KitchenMarland KitchenOnce in the bed, pt's LLE was inspected for pressure areas under brace.  There was one small area of erythema on the lower tibia  for which we will place extra padding to protect the skin.  Pt and his wife were instructed in donnin/doffing the brace and in how to adjust it for changes in the circumference of the LE.  Pt is much more comfortable with the brace on his L knee, so it remains in place.  Because pt is so weak and deconditioned, I am recommending SNF at d/c.  Pt and wife agree.            Follow Up Recommendations  SNF     Does the patient have the potential to tolerate intense rehabilitation     Barriers to Discharge        Equipment Recommendations       Recommendations for Other Services    Frequency Min 3X/week   Plan Discharge plan needs to be updated;Frequency needs to be updated    Precautions / Restrictions Precautions Other Brace/Splint: error was made on evaluation note...brace must be worn for all gait Restrictions Weight Bearing Restrictions: No LLE Weight Bearing: Weight bearing as tolerated   Pertinent Vitals/Pain     Mobility  Bed Mobility Bed Mobility: Not assessed Transfers Transfers: Sit to Stand;Stand to Sit;Stand Pivot Transfers Sit to Stand: 1: +1 Total assist;From chair/3-in-1 Stand to Sit: 1: +1 Total assist;To  chair/3-in-1 Stand Pivot Transfers: 2: Max assist Details for Transfer Assistance: pt is able to use a walker with max assist to pivot to chair Ambulation/Gait Ambulation/Gait Assistance: Not tested (comment) (unable to ambulate)    Exercises     PT Diagnosis:    PT Problem List:   PT Treatment Interventions:     PT Goals Acute Rehab PT Goals Time For Goal Achievement: 08/17/12 Potential to Achieve Goals: Good Pt will go Supine/Side to Sit: with mod assist;with HOB not 0 degrees (comment degree) PT Goal: Supine/Side to Sit - Progress: Goal set today Pt will go Sit to Supine/Side: with mod assist;with HOB not 0 degrees (comment degree) PT Goal: Sit to Supine/Side - Progress: Goal set today Pt will go Sit to Stand: with max assist;with upper extremity assist PT Goal: Sit to Stand - Progress: Goal set today Pt will go Stand to Sit: with mod assist;with upper extremity assist PT Goal: Stand to Sit - Progress: Goal set today Pt will Transfer Bed to Chair/Chair to Bed: with max assist PT Transfer Goal: Bed to Chair/Chair to Bed - Progress: Goal set today Pt will Ambulate: 1 - 15 feet;with rolling walker PT Goal: Ambulate - Progress: Revised due to lack of progress Additional Goals PT Goal: Additional Goal #1 - Progress: Met  Visit Information  Last PT Received On: 08/03/12    Subjective Data  Subjective: Pt states that he now has no knee pain   Cognition  Cognition Arousal/Alertness: Awake/alert Behavior During Therapy: WFL for tasks assessed/performed Overall Cognitive Status: Within Functional Limits for tasks assessed    Balance  Balance Balance Assessed: No  End of Session PT - End of Session Equipment Utilized During Treatment: Gait belt;Oxygen Activity Tolerance: Patient limited by fatigue Patient left: in chair;with call bell/phone within reach;with nursing in room Nurse Communication: Mobility status   GP     Charles [name] 08/03/2012, 12:46 PM

## 2012-08-03 NOTE — Clinical Social Work Psychosocial (Signed)
Clinical Social Work Department BRIEF PSYCHOSOCIAL ASSESSMENT 08/03/2012  Patient:  Charles [name], Charles [name]     Account Number:  [id number]     Admit date:  07/30/2012  Clinical Social Worker:  Nancie Neas  Date/Time:  08/03/2012 02:30 PM  Referred by:  Physician  Date Referred:  08/03/2012 Referred for  SNF Placement   Other Referral:   Interview type:  Patient Other interview type:   and wife    PSYCHOSOCIAL DATA Living Status:  WIFE Admitted from facility:   Level of care:   Primary support name:  Carney Bern Primary support relationship to patient:  SPOUSE Degree of support available:   supportive    CURRENT CONCERNS Current Concerns  Post-Acute Placement   Other Concerns:    SOCIAL WORK ASSESSMENT / PLAN CSW met with pt and pt's wife at bedside. Pt alert and oriented and reports he lives with his wife who appears to be involved and supportive. Pt states he was independent at home prior to admission. He was walking down the stairs and felt he twisted his knee. Pt had his knee cap removed in the 1980s. He developed a fever at home and was advised to come to ED for evaluation. Pt admitted with sepsis due to pneumonia. He reports he has had pneumonia multiple times. He is in remission with lymphoma and is followed in the cancer clinic at [facility]. PT evaluated pt today and feel that due to deconditioning, pt would benefit from SNF at d/c. CSW discussed placement process and pt and pt's wife are agreeable to initiate bed search, requesting [facility] if possible. They are aware of Medicare coverage/criteria.   Assessment/plan status:  Psychosocial Support/Ongoing Assessment of Needs Other assessment/ plan:   Information/referral to community resources:   SNF list    PATIENT'S/FAMILY'S RESPONSE TO PLAN OF CARE: Pt and pt's wife feel pt would benefit from [facility] SNF prior to returning home. CSW will fax out FL2 and follow up with bed offers when available.       Derenda Fennel,  [place] [phone]

## 2012-08-03 NOTE — Progress Notes (Signed)
NAME:  Charles [name], Charles [name] NO.:  [id number]  MEDICAL RECORD NO.:  [id number]  LOCATION:  A218                          FACILITY:  [facility]  PHYSICIAN:  Mila Homer. [name], M.D.DATE OF BIRTH:  06/25/1940  DATE OF PROCEDURE: DATE OF DISCHARGE:                                PROGRESS NOTE   SUBJECTIVE:  He is still in the ICU.  His wife says his pain is much better since he has been on the Dilaudid IV.  In fact, he has only had 1 injection in the last 24 hours.  OBJECTIVE:  Temperature is 98 degrees, pulse 81, respiratory rate 20, blood pressure 103/61.  He is semi-recumbent in bed.  His wife is in the room with him.  His lungs show decreased breath sounds throughout, but he is moving air well.  His heart has an irregular irregularity, rate is around 80.  His left knee is still swollen.  DIAGNOSTIC STUDIES:  His INR today was 2.87, glucose 109.  His BMP shows a calcium of 6.9.  His blood cultures x2 are positive for group A strep.  Culture of the left knee, done yesterday, is negative at this point.  No organisms were seen, but predominantly polymorphonuclear neutrophils were seen under the scope.  MRSA was negative by PCR.  ASSESSMENT: 1. Infected left knee. 2. Recurrent pneumonia. 3. Lymphoma. 4. Severe degenerative changes of the knee. 5. Anticoagulation. 6. Chronic atrial fibrillation.  PLAN: 1. We will continue his antibiotics. 2. He will be transferred from the ICU to a Med-Surg floor.    Mila Homer. [name], M.D.    SDK/MEDQ  D:  08/03/2012  T:  08/03/2012  Job:  [id number]

## 2012-08-04 ENCOUNTER — Ambulatory Visit (HOSPITAL_COMMUNITY): Payer: Medicare Other

## 2012-08-04 ENCOUNTER — Encounter (HOSPITAL_COMMUNITY): Payer: Medicare Other

## 2012-08-04 LAB — BASIC METABOLIC PANEL
BUN: 15 mg/dL (ref 6–23)
Calcium: 7.3 mg/dL — ABNORMAL LOW (ref 8.4–10.5)
Creatinine, Ser: 0.72 mg/dL (ref 0.50–1.35)
GFR calc non Af Amer: >90 mL/min (ref >90)
Glucose, Bld: 107 mg/dL — ABNORMAL HIGH (ref 70–99)
Potassium: 4 mEq/L (ref 3.5–5.1)

## 2012-08-04 MED ORDER — PENTAFLUOROPROP-TETRAFLUOROETH EX AERO
INHALATION_SPRAY | Freq: Once | CUTANEOUS | Status: DC
  Filled 2012-08-04: qty 103.5

## 2012-08-04 MED ORDER — WARFARIN SODIUM 1 MG PO TABS
1.0000 mg | ORAL_TABLET | Freq: Once | ORAL | Status: AC
  Administered 2012-08-04: 1 mg via ORAL
  Filled 2012-08-04: qty 1

## 2012-08-04 MED ORDER — LIDOCAINE HCL (PF) 1 % IJ SOLN
5.0000 mL | Freq: Once | INTRAMUSCULAR | Status: DC
  Filled 2012-08-04: qty 5

## 2012-08-04 MED ORDER — METHYLPREDNISOLONE ACETATE 40 MG/ML IJ SUSP
40.0000 mg | Freq: Once | INTRAMUSCULAR | Status: DC
  Filled 2012-08-04: qty 1

## 2012-08-04 NOTE — Progress Notes (Signed)
NAME:  CAIDIN, HEIDENREICH NO.:  [id number]  MEDICAL RECORD NO.:  [id number]  LOCATION:  A218                          FACILITY:  [facility]  PHYSICIAN:  Mila Homer. [name], M.D.DATE OF BIRTH:  03/20/1941  DATE OF PROCEDURE: DATE OF DISCHARGE:                                PROGRESS NOTE   SUBJECTIVE:  He is feeling somewhat better.  His knee is feeling better since the brace has been loosened.  OBJECTIVE:  VITAL SIGNS:  Temperature is 97.9, pulse 80, respiratory rate 20, blood pressure 95/59. GENERAL:  He is sitting up in bed eating breakfast.  He really looks a good deal better.  He is out of the ICU. LUNGS:  Some expiratory wheezing throughout, but he is moving air well. HEART:  Irregular irregularity.  The culture of his  knee is still negative.  The x-ray of his knee showed CPPD arthropathy of the knee and a prior patellectomy.  ASSESSMENT: 1. Pneumonia. 2. 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 3. Lymphoma. 4. Chronic atrial fibrillation. 5. Anticoagulation.  PLAN:  I discussed his case with Dr. Romeo Apple, who feels that this is probably not a septic knee, but more likely arthritic flare-up.  He is planning to inject him with steroids today.  Meanwhile, the patient will continue on IV antibiotics including vancomycin and cefepime.     Mila Homer. [name], M.D.     SDK/MEDQ  D:  08/04/2012  T:  08/04/2012  Job:  [id number]

## 2012-08-04 NOTE — Procedures (Signed)
Knee  Injection Procedure Note  Pre-operative Diagnosis: left knee oa  Post-operative Diagnosis: same  Indications: pain  Anesthesia: ethyl chloride   Procedure Details   Verbal consent was obtained for the procedure. Time out was completed.The joint was prepped with alcohol, followed by  Ethyl chloride spray and A 20 gauge needle was inserted into the knee via lateral approach; 5ml 1% lidocaine and 1 ml of depomedrol  was then injected into the joint . The needle was removed and the area cleansed and dressed.  Complications:  None; patient tolerated the procedure well.

## 2012-08-04 NOTE — Clinical Social Work Note (Signed)
CSW presented bed offers to pt and pt's wife who choose [facility]. Facility notified. Awaiting stability for d/c. Pt expressing he is in a lot of pain and requesting medication. CSW notified RN.  Derenda Fennel, [place] [phone]

## 2012-08-04 NOTE — Care Management Note (Signed)
    Page 1 of 1   08/05/2012     3:42:04 PM   CARE MANAGEMENT NOTE 08/05/2012  Patient:  Charles [name], Charles [name]   Account Number:  [id number]  Date Initiated:  08/03/2012  Documentation initiated by:  Anibal Henderson  Subjective/Objective Assessment:   admitted with pneumonia, and knee injury. Patient lives at home with spouse, and is usually fairly independent.wife assists as needed.  patient was very deconditioned, and is having problems with his knee- he and his wife have decided,-     Action/Plan:   along with PT recommendation, that he will go to skilled nursing facility for rehab at discharge. CSW following   Anticipated DC Date:  08/05/2012   Anticipated DC Plan:  SKILLED NURSING FACILITY  In-house referral  Clinical Social Worker      DC Planning Services  CM consult      Choice offered to / List presented to:             Status of service:  Completed, signed off Medicare Important Message given?  YES (If response is "NO", the following Medicare IM given date fields will be blank) Date Medicare IM given:  08/05/2012 Date Additional Medicare IM given:    Discharge Disposition:    Per UR Regulation:  Reviewed for med. necessity/level of care/duration of stay  If discussed at Long Length of Stay Meetings, dates discussed:    Comments:  08/05/12 1000 Anibal Henderson RN Pt being D/C to SNF today 08/04/12 1130 Anibal Henderson RN

## 2012-08-04 NOTE — Progress Notes (Signed)
PT Cancellation Note  Patient Details Name: Charles [name] MRN: [id number] DOB: 1940/11/26   Cancelled Treatment:    Reason Eval/Treat Not Completed: Pain limiting ability to participate Pt states that his knee felt okay until around 2:00 this afternoon. Pain became unbearable. After having pain medicine pain has decreased but he would like to hold therapy until tomorrow to avoiding irritating it up again.  Seth Bake, PTA 08/04/2012, 3:30 PM

## 2012-08-04 NOTE — Clinical Social Work Placement (Signed)
Clinical Social Work Department CLINICAL SOCIAL WORK PLACEMENT NOTE 08/04/2012  Patient:  Charles [name], Charles [name]  Account Number:  [id number] Admit date:  07/30/2012  Clinical Social Worker:  Derenda Fennel, LCSW  Date/time:  08/03/2012 02:30 PM  Clinical Social Work is seeking post-discharge placement for this patient at the following level of care:   SKILLED NURSING   (*CSW will update this form in Epic as items are completed)   08/03/2012  Patient/family provided with [facility] Department of Clinical Social Work's list of facilities offering this level of care within the geographic area requested by the patient (or if unable, by the patient's family).  08/03/2012  Patient/family informed of their freedom to choose among providers that offer the needed level of care, that participate in Medicare, Medicaid or managed care program needed by the patient, have an available bed and are willing to accept the patient.  08/03/2012  Patient/family informed of [facility]' ownership interest in [facility], as well as of the fact that they are under no obligation to receive care at this facility.  PASARR submitted to EDS on 08/03/2012 PASARR number received from EDS on 08/03/2012  FL2 transmitted to all facilities in geographic area requested by pt/family on  08/03/2012 FL2 transmitted to all facilities within larger geographic area on   Patient informed that his/her managed care company has contracts with or will negotiate with  certain facilities, including the following:     Patient/family informed of bed offers received:  08/04/2012 Patient chooses bed at [facility] [place] Physician recommends and patient chooses bed at  [facility] [place]  Patient to be transferred to  on   Patient to be transferred to facility by   The following physician request were entered in Epic:   Additional Comments:  Derenda Fennel, LCSW [phone]

## 2012-08-04 NOTE — Progress Notes (Signed)
ANTICOAGULATION CONSULT NOTE  Pharmacy Consult for Warfarin Indication: atrial fibrillation  Allergies  Allergen Reactions  . Statins     Intolerant secondary to severe myalgias   Patient Measurements: Height: 5\' 7"  (170.2 cm) Weight: 212 lb 4.9 oz (96.3 kg) IBW/kg (Calculated) : 66.1  Vital Signs: Temp: 97.9 F (36.6 C) (05/14 0536) Temp src: Oral (05/14 0536) BP: 93/51 mmHg (05/14 0536) Pulse Rate: 90 (05/14 0536)  Labs:  Recent Labs  08/02/12 0455 08/03/12 0447 08/04/12 0449  HGB 8.6*  --   --   HCT 25.4*  --   --   PLT 151  --   --   LABPROT 25.0* 28.6* 29.5*  INR 2.39* 2.87* 2.99*  CREATININE 0.75 0.75 0.72   Estimated Creatinine Clearance: 91 ml/min (by C-G formula based on Cr of 0.72).  Medical History: Past Medical History  Diagnosis Date  . Hypertension   . Atrial fibrillation 10/2008  . Nonischemic cardiomyopathy 02/2009    Presented with CHF; EF of 25-30%; Nonobstructive ASCVD-worst lesion 40% on coronary angiography  . Peptic ulcer disease   . Degenerative joint disease     Knees and shoulders  . Pneumonia 2010  . Low TSH level     Borderline  . Allergic rhinitis   . Anxiety   . Drug abuse   . Tobacco abuse     50 pack years  . 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 Basal cell carcinoma    Medications:  Prescriptions prior to admission  Medication Sig Dispense Refill  . ALPRAZolam (XANAX) 1 MG tablet Take 0.5 mg by mouth 4 (four) times daily.       . carvedilol (COREG) 25 MG tablet Take 25 mg by mouth 2 (two) times daily. 1/2 tablet if BP is below 10/70       . diltiazem (CARDIZEM CD) 300 MG 24 hr capsule Take 1 capsule (300 mg total) by mouth daily.  30 capsule  6  . HYDROcodone-acetaminophen (NORCO/VICODIN) 5-325 MG per tablet Take 1 tablet by mouth every 6 (six) hours as needed for pain.       Marland Kitchen lovastatin (MEVACOR) 10 MG tablet Take 10 mg by mouth daily.       . Multiple Vitamins-Iron (ONE-TABLET-DAILY/IRON PO) Take 1 tablet by mouth daily.       Marland Kitchen omeprazole (PRILOSEC) 20 MG capsule Take 20 mg by mouth daily.      . pegfilgrastim (NEULASTA) 6 MG/0.6ML injection Inject 6 mg into the skin every 14 (fourteen) days. For a total of 4 doses.      Marland Kitchen PROAIR HFA 108 (90 BASE) MCG/ACT inhaler Inhale 1 puff into the lungs every 6 (six) hours as needed. For shortness of breath      . traMADol (ULTRAM) 50 MG tablet Take 50-100 mg by mouth 4 (four) times daily as needed. For pain      . warfarin (COUMADIN) 5 MG tablet Take 0.5-1 tablets (2.5-5 mg total) by mouth daily. Pt takes 2.5mg  on  sun, tue, thu , sat and 5 mg all other days  30 tablet  3  . denosumab (PROLIA) 60 MG/ML SOLN injection Inject 60 mg into the skin every 6 (six) months. Administer in upper arm, thigh, or abdomen      . furosemide (LASIX) 40 MG tablet Take 20-40 mg by mouth daily as needed (for fluid retention). Pt can take a half to 1 tab as needed for fluid retention      . potassium chloride SA (K-DUR,KLOR-CON)  20 MEQ tablet Take 20 mEq by mouth daily as needed (takes along with lasix).        Assessment: 72 yo M on chronic warfarin for Afib.  PTA, dose was recently reduced to above regimen for elevated INR.   INR is within goal range today but is trending to upper end of goal range despite dose decrease yesterday.  No bleeding noted.   Goal of Therapy:  INR 2-3   Plan:  Warfarin 1mg  PO x 1 today. Daily PT/INR  Valrie Hart A 08/04/2012,7:55 AM

## 2012-08-05 LAB — CBC WITH DIFFERENTIAL/PLATELET
Basophils Absolute: 0.1 10*3/uL (ref 0.0–0.1)
Eosinophils Relative: 2 % (ref 0–5)
HCT: 26.2 % — ABNORMAL LOW (ref 39.0–52.0)
Lymphocytes Relative: 3 % — ABNORMAL LOW (ref 12–46)
MCV: 88.8 fL (ref 78.0–100.0)
Monocytes Absolute: 1.1 10*3/uL — ABNORMAL HIGH (ref 0.1–1.0)
RDW: 19.8 % — ABNORMAL HIGH (ref 11.5–15.5)
WBC: 41.7 10*3/uL — ABNORMAL HIGH (ref 4.0–10.5)

## 2012-08-05 MED ORDER — CARVEDILOL 25 MG PO TABS: 25.0000 mg | ORAL_TABLET | Freq: Two times a day (BID) | ORAL | Status: DC

## 2012-08-05 MED ORDER — DEXTROSE 5 % IV SOLN: 1.0000 g | Freq: Three times a day (TID) | INTRAVENOUS | Status: DC

## 2012-08-05 MED ORDER — TRAMADOL HCL 50 MG PO TABS: 100.0000 mg | ORAL_TABLET | Freq: Every day | ORAL | Status: DC

## 2012-08-05 MED ORDER — ACETAMINOPHEN 325 MG PO TABS: 650.0000 mg | ORAL_TABLET | Freq: Four times a day (QID) | ORAL | Status: DC | PRN

## 2012-08-05 MED ORDER — ALPRAZOLAM 0.5 MG PO TABS: 0.5000 mg | ORAL_TABLET | Freq: Four times a day (QID) | ORAL | Status: DC

## 2012-08-05 MED ORDER — SODIUM CHLORIDE 0.9 % IV SOLN: 1000.0000 mL | INTRAVENOUS | Status: DC

## 2012-08-05 MED ORDER — CALCIUM CITRATE 950 (200 CA) MG PO TABS: 1.0000 | ORAL_TABLET | Freq: Two times a day (BID) | ORAL | Status: DC

## 2012-08-05 MED ORDER — WARFARIN - PHARMACIST DOSING INPATIENT: 5.0000 | Status: DC

## 2012-08-05 MED ORDER — HEPARIN SOD (PORK) LOCK FLUSH 100 UNIT/ML IV SOLN
500.0000 [IU] | INTRAVENOUS | Status: AC | PRN
  Administered 2012-08-05: 500 [IU]
  Filled 2012-08-05: qty 5

## 2012-08-05 MED ORDER — HYDROCODONE-ACETAMINOPHEN 5-325 MG PO TABS: 1.0000 | ORAL_TABLET | ORAL | Status: DC | PRN

## 2012-08-05 MED ORDER — GUAIFENESIN ER 600 MG PO TB12: 1200.0000 mg | ORAL_TABLET | Freq: Two times a day (BID) | ORAL | Status: DC

## 2012-08-05 MED ORDER — DILTIAZEM HCL ER 120 MG PO CP12: 120.0000 mg | ORAL_CAPSULE | Freq: Two times a day (BID) | ORAL | Status: DC

## 2012-08-05 MED ORDER — VANCOMYCIN HCL 10 G IV SOLR: 1250.0000 mg | Freq: Two times a day (BID) | INTRAVENOUS | Status: DC

## 2012-08-05 MED ORDER — FILGRASTIM 480 MCG/1.6ML IJ SOLN: 480.0000 ug | Freq: Every day | INTRAMUSCULAR | Status: DC

## 2012-08-05 NOTE — Progress Notes (Signed)
Patient with orders to be discharge to [facility]. Report called to April, nurse. Patient in stable condition upon discharge. Patient left via EMS.

## 2012-08-05 NOTE — Progress Notes (Signed)
Subjective: He says he feels okay. He had an injection in his knee today. His breathing is better. He has a bed available at a skilled care facility.  Objective: Vital signs in last 24 hours: Temp:  [97.9 F (36.6 C)-98.8 F (37.1 C)] 97.9 F (36.6 C) (05/15 0510) Pulse Rate:  [69-112] 112 (05/15 0600) Resp:  [20-22] 22 (05/15 0510) BP: (84-153)/(53-97) 98/63 mmHg (05/15 0600) SpO2:  [97 %] 97 % (05/15 0510) Weight change:  Last BM Date: 08/03/12  Intake/Output from previous day: 05/14 0701 - 05/15 0700 In: 1573.3 [P.O.:480; I.V.:793.3; IV Piggyback:300] Out: 300 [Urine:300]  PHYSICAL EXAM General appearance: alert, cooperative and mild distress Resp: wheezes bilaterally Cardio: irregularly irregular rhythm GI: soft, non-tender; bowel sounds normal; no masses,  no organomegaly Extremities: His left knee is swollen and shows an injection site  Lab Results:    Basic Metabolic Panel:  Recent Labs  16/10/96 0447 08/04/12 0449  NA 136 136  K 3.5 4.0  CL 104 106  CO2 20 18*  GLUCOSE 109* 107*  BUN 12 15  CREATININE 0.75 0.72  CALCIUM 6.9* 7.3*   Liver Function Tests: No results found for this basename: AST, ALT, ALKPHOS, BILITOT, PROT, ALBUMIN,  in the last 72 hours No results found for this basename: LIPASE, AMYLASE,  in the last 72 hours No results found for this basename: AMMONIA,  in the last 72 hours CBC: No results found for this basename: WBC, NEUTROABS, HGB, HCT, MCV, PLT,  in the last 72 hours Cardiac Enzymes: No results found for this basename: CKTOTAL, CKMB, CKMBINDEX, TROPONINI,  in the last 72 hours BNP: No results found for this basename: PROBNP,  in the last 72 hours D-Dimer: No results found for this basename: DDIMER,  in the last 72 hours CBG: No results found for this basename: GLUCAP,  in the last 72 hours Hemoglobin A1C: No results found for this basename: HGBA1C,  in the last 72 hours Fasting Lipid Panel: No results found for this  basename: CHOL, HDL, LDLCALC, TRIG, CHOLHDL, LDLDIRECT,  in the last 72 hours Thyroid Function Tests: No results found for this basename: TSH, T4TOTAL, FREET4, T3FREE, THYROIDAB,  in the last 72 hours Anemia Panel: No results found for this basename: VITAMINB12, FOLATE, FERRITIN, TIBC, IRON, RETICCTPCT,  in the last 72 hours Coagulation:  Recent Labs  08/04/12 0449 08/05/12 0444  LABPROT 29.5* 28.3*  INR 2.99* 2.83*   Urine Drug Screen: Drugs of Abuse  No results found for this basename: labopia, cocainscrnur, labbenz, amphetmu, thcu, labbarb    Alcohol Level: No results found for this basename: ETH,  in the last 72 hours Urinalysis: No results found for this basename: COLORURINE, APPERANCEUR, LABSPEC, PHURINE, GLUCOSEU, HGBUR, BILIRUBINUR, KETONESUR, PROTEINUR, UROBILINOGEN, NITRITE, LEUKOCYTESUR,  in the last 72 hours Misc. Labs:  ABGS No results found for this basename: PHART, PCO2, PO2ART, TCO2, HCO3,  in the last 72 hours CULTURES Recent Results (from the past 240 hour(s))  CULTURE, BLOOD (ROUTINE X 2)     Status: None   Collection Time    07/30/12 12:30 PM      Result Value Range Status   Specimen Description BLOOD LEFT ARM   Final   Special Requests BOTTLES DRAWN AEROBIC AND ANAEROBIC  8 CC EACH   Final   Culture  Setup Time 07/31/2012 19:44   Final   Culture     Final   Value: GROUP A STREP (S.PYOGENES) ISOLATED     Note: Gram Stain Report  Called to,Read Back By and Verified With: Pasty Arch @ 1610 07/31/12 BY Cristela Blue J Performed at [facility] CRITICAL RESULT CALLED TO, READ BACK BY AND VERIFIED WITH: SHARON WARD 08/01/12 @ 10:04AM BY RUSCA.   Report Status 08/01/2012 FINAL   Final  CULTURE, BLOOD (ROUTINE X 2)     Status: None   Collection Time    07/30/12 12:45 PM      Result Value Range Status   Specimen Description BLOOD RIGHT ARM   Final   Special Requests BOTTLES DRAWN AEROBIC AND ANAEROBIC  6 CC EACH   Final   Culture  Setup Time 07/31/2012 02:50    Final   Culture     Final   Value: GROUP A STREP (S.PYOGENES) ISOLATED     Note: Gram Stain Report Called to,Read Back By and Verified With: SMITH J @ 0150 07/31/12 BY Cristela Blue J Performed at [facility] CRITICAL RESULT CALLED TO, READ BACK BY AND VERIFIED WITH: SHARON WARD 08/01/12 @ 10:04AM BY RUSCA.   Report Status 08/01/2012 FINAL   Final  MRSA PCR SCREENING     Status: None   Collection Time    07/30/12  7:22 PM      Result Value Range Status   MRSA by PCR NEGATIVE  NEGATIVE Final   Comment:            The GeneXpert MRSA Assay (FDA     approved for NASAL specimens     only), is one component of a     comprehensive MRSA colonization     surveillance program. It is not     intended to diagnose MRSA     infection nor to guide or     monitor treatment for     MRSA infections.  CULTURE, EXPECTORATED SPUTUM-ASSESSMENT     Status: None   Collection Time    08/01/12  1:01 PM      Result Value Range Status   Specimen Description SPU   Final   Special Requests NONE   Final   Sputum evaluation     Final   Value: MICROSCOPIC FINDINGS SUGGEST THAT THIS SPECIMEN IS NOT REPRESENTATIVE OF LOWER RESPIRATORY SECRETIONS. PLEASE RECOLLECT.     Gram Stain Report Called to,Read Back By and Verified With: WARD, S. AT 15:03PM ON 08/01/12 BY PRUITT, C.   Report Status 08/01/2012 FINAL   Final  BODY FLUID CULTURE     Status: None   Collection Time    08/02/12  9:42 AM      Result Value Range Status   Specimen Description FLUID KNEE JOINT   Final   Special Requests NONE   Final   Gram Stain     Final   Value: WBC PRESENT, PREDOMINANTLY PMN     NO ORGANISMS SEEN   Culture NO GROWTH 1 DAY   Final   Report Status PENDING   Incomplete   Studies/Results: No results found.  Medications:  Prior to Admission:  Prescriptions prior to admission  Medication Sig Dispense Refill  . ALPRAZolam (XANAX) 1 MG tablet Take 0.5 mg by mouth 4 (four) times daily.       . carvedilol (COREG) 25 MG tablet  Take 25 mg by mouth 2 (two) times daily. 1/2 tablet if BP is below 10/70       . diltiazem (CARDIZEM CD) 300 MG 24 hr capsule Take 1 capsule (300 mg total) by mouth daily.  30 capsule  6  . HYDROcodone-acetaminophen (  NORCO/VICODIN) 5-325 MG per tablet Take 1 tablet by mouth every 6 (six) hours as needed for pain.       Marland Kitchen lovastatin (MEVACOR) 10 MG tablet Take 10 mg by mouth daily.       . Multiple Vitamins-Iron (ONE-TABLET-DAILY/IRON PO) Take 1 tablet by mouth daily.      Marland Kitchen omeprazole (PRILOSEC) 20 MG capsule Take 20 mg by mouth daily.      . pegfilgrastim (NEULASTA) 6 MG/0.6ML injection Inject 6 mg into the skin every 14 (fourteen) days. For a total of 4 doses.      Marland Kitchen PROAIR HFA 108 (90 BASE) MCG/ACT inhaler Inhale 1 puff into the lungs every 6 (six) hours as needed. For shortness of breath      . traMADol (ULTRAM) 50 MG tablet Take 50-100 mg by mouth 4 (four) times daily as needed. For pain      . warfarin (COUMADIN) 5 MG tablet Take 0.5-1 tablets (2.5-5 mg total) by mouth daily. Pt takes 2.5mg  on  sun, tue, thu , sat and 5 mg all other days  30 tablet  3  . denosumab (PROLIA) 60 MG/ML SOLN injection Inject 60 mg into the skin every 6 (six) months. Administer in upper arm, thigh, or abdomen      . furosemide (LASIX) 40 MG tablet Take 20-40 mg by mouth daily as needed (for fluid retention). Pt can take a half to 1 tab as needed for fluid retention      . potassium chloride SA (K-DUR,KLOR-CON) 20 MEQ tablet Take 20 mEq by mouth daily as needed (takes along with lasix).        Scheduled: . ALPRAZolam  0.5 mg Oral QID  . antiseptic oral rinse  15 mL Mouth Rinse BID  . calcium citrate  200 mg of elemental calcium Oral BID  . carvedilol  25 mg Oral BID WC  . ceFEPime (MAXIPIME) IV  1 g Intravenous Q8H  . diltiazem  120 mg Oral Q12H  . filgrastim (NEUPOGEN)  SQ  480 mcg Subcutaneous q1800  . guaiFENesin  1,200 mg Oral BID  . lidocaine (PF)  5 mL Other Once  . methylPREDNISolone acetate  40 mg  Intra-articular Once  . pantoprazole  40 mg Oral Daily  . pentafluoroprop-tetrafluoroeth   Topical Once  . traMADol  100 mg Oral QHS  . traMADol  50 mg Oral Once  . vancomycin  1,250 mg Intravenous Q12H  . Warfarin - Pharmacist Dosing Inpatient   Does not apply Q24H   Continuous: . sodium chloride 100 mL/hr at 08/04/12 2308  . sodium chloride 10 mL/hr at 07/31/12 0600   ZOX:[id number], HYDROcodone-acetaminophen, HYDROmorphone (DILAUDID) injection, HYDROmorphone (DILAUDID) injection, ondansetron (ZOFRAN) IV  Assesment: He has history of sepsis with group A strep. 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 He has atrial fibrillation. He has however improved to the point that I think he can go to skilled care facility today Principal Problem:   Sepsis Active Problems:   HYPERTENSION   Chronic anticoagulation   Follicular lymphoma   Anemia, normocytic normochromic   HCAP (healthcare-associated pneumonia)   Atrial fibrillation with rapid ventricular response   Leukocytosis, unspecified   Left knee pain    Plan: Transfer to skilled care facility today    LOS: 6 days   Lanayah Gartley L 08/05/2012, 8:50 AM

## 2012-08-05 NOTE — Discharge Summary (Signed)
Physician Discharge Summary  Patient ID: Charles [name] MRN: [id number] DOB/AGE: 1940/12/01 72 y.o. Primary Care Physician:KNOWLTON,STEPHEN D, MD Admit date: 07/30/2012 Discharge date: 08/05/2012    Discharge Diagnoses:   Principal Problem:   Sepsis Active Problems:   HYPERTENSION   Chronic anticoagulation   Follicular lymphoma   Anemia, normocytic normochromic   HCAP (healthcare-associated pneumonia)   Atrial fibrillation with rapid ventricular response   Leukocytosis, unspecified   Left knee pain     Medication List    STOP taking these medications       diltiazem 300 MG 24 hr capsule  Commonly known as:  CARDIZEM CD     furosemide 40 MG tablet  Commonly known as:  LASIX     potassium chloride SA 20 MEQ tablet  Commonly known as:  K-DUR,KLOR-CON     warfarin 5 MG tablet  Commonly known as:  COUMADIN      TAKE these medications       acetaminophen 325 MG tablet  Commonly known as:  TYLENOL  Take 2 tablets (650 mg total) by mouth every 6 (six) hours as needed for fever.     ALPRAZolam 0.5 MG tablet  Commonly known as:  XANAX  Take 1 tablet (0.5 mg total) by mouth 4 (four) times daily.     calcium citrate 950 MG tablet  Commonly known as:  CALCITRATE - dosed in mg elemental calcium  Take 1 tablet (200 mg of elemental calcium total) by mouth 2 (two) times daily.     carvedilol 25 MG tablet  Commonly known as:  COREG  Take 1 tablet (25 mg total) by mouth 2 (two) times daily with a meal.     denosumab 60 MG/ML Soln injection  Commonly known as:  PROLIA  Inject 60 mg into the skin every 6 (six) months. Administer in upper arm, thigh, or abdomen     dextrose 5 % SOLN 50 mL with ceFEPIme 1 G SOLR 1 g  Inject 1 g into the vein every 8 (eight) hours.     diltiazem 120 MG 12 hr capsule  Commonly known as:  CARDIZEM SR  Take 1 capsule (120 mg total) by mouth every 12 (twelve) hours.     filgrastim 480 MCG/1.6ML injection  Commonly known as:  NEUPOGEN   Inject 1.6 mLs (480 mcg total) into the skin daily at 6 PM.     guaiFENesin 600 MG 12 hr tablet  Commonly known as:  MUCINEX  Take 2 tablets (1,200 mg total) by mouth 2 (two) times daily.     HYDROcodone-acetaminophen 5-325 MG per tablet  Commonly known as:  NORCO/VICODIN  Take 1 tablet by mouth every 4 (four) hours as needed.     lovastatin 10 MG tablet  Commonly known as:  MEVACOR  Take 10 mg by mouth daily.     NEULASTA 6 MG/0.6ML injection  Generic drug:  pegfilgrastim  Inject 6 mg into the skin every 14 (fourteen) days. For a total of 4 doses.     omeprazole 20 MG capsule  Commonly known as:  PRILOSEC  Take 20 mg by mouth daily.     ONE-TABLET-DAILY/IRON PO  Take 1 tablet by mouth daily.     PROAIR HFA 108 (90 BASE) MCG/ACT inhaler  Generic drug:  albuterol  Inhale 1 puff into the lungs every 6 (six) hours as needed. For shortness of breath     sodium chloride 0.9 % infusion  Inject 1,000 mLs into the vein continuous.  sodium chloride 0.9 % SOLN 250 mL with vancomycin 10 G SOLR 1,250 mg  Inject 1,250 mg into the vein every 12 (twelve) hours.     traMADol 50 MG tablet  Commonly known as:  ULTRAM  Take 2 tablets (100 mg total) by mouth at bedtime.     Warfarin - Pharmacist Dosing Inpatient Misc  5 each by Does not apply route daily.        Discharged Condition: Improved    Consults: Orthopedics  Significant Diagnostic Studies: Dg Chest 2 View  07/30/2012   *RADIOLOGY REPORT*  Clinical Data: Fever, chills  CHEST - 2 VIEW  Comparison: 06/15/2012  Findings: Cardiomediastinal silhouette is stable.  There is small amount of fluid in the right minor fissure.  Right humeral prosthesis.  There is hazy infiltrate/pneumonia in the left lower lobe posteriorly.  IMPRESSION: Hazy infiltrate/pneumonia left lower lobe posteriorly.  Follow-up to resolution after treatment is recommended.   Original Report Authenticated By: Natasha Mead, M.D.   Dg Knee Complete 4 Views  Left  07/30/2012   *RADIOLOGY REPORT*  Clinical Data: Left knee pain and swelling.  LEFT KNEE - COMPLETE 4+ VIEW  Comparison: None.  Findings: Prior patellectomy noted.  Small ossific fragments noted in the expected location of the patella.  Markedly severe loss articular space noted in the lateral compartment with marginal spurring in the medial and lateral compartments.  Chondrocalcinosis of the menisci observed. Ossification and spurring noted along the lateral margin of the lateral femoral condyle.  IMPRESSION:  1.  Severe CPPD arthropathy of the knee. 2.  Prior patellectomy. 3.  Ossifications along the lateral margin of the lateral femoral condyle with fragmented spurring in this vicinity.   Original Report Authenticated By: Gaylyn Rong, M.D.   Dg Cv Line Injection  07/28/2012   *RADIOLOGY REPORT*  Clinical Data: Nonfunctional Port-A-Cath  CV CATH INJECTION  Comparison: None  Fluoroscopy time: 0 minutes 54 seconds.  Findings: The patient's indwelling left subclavian Port-A-Cath was already accessed. Omnipaque 300 was injected via the catheter and multiple images were obtained.  Port-A-Cath and tubing are contiguous. Port-A-Cath reservoir normally opacifies with contrast without extravasation. Port-A-Cath tubing opacifies with contrast normally. High-grade obstruction to exit of tracer from the tip of the catheter is identified. Only a small amount contrast is seen exiting the distal tip but this remains focally contained at this site. Contrast passes retrograde back along the catheter into the left brachiocephalic vein where there is free mixing with bloodstream. Findings are most compatible with a fibrin plug at the tip with a fibrin sheath along the distal catheter.  IMPRESSION: Sub total occlusion of the Port-A-Cath tubing at the tip by combination of fibrin plug and fibrin sheath.   Original Report Authenticated By: Ulyses Southward, M.D.    Lab Results: Basic Metabolic Panel:  Recent Labs   08/03/12 0447 08/04/12 0449  NA 136 136  K 3.5 4.0  CL 104 106  CO2 20 18*  GLUCOSE 109* 107*  BUN 12 15  CREATININE 0.75 0.72  CALCIUM 6.9* 7.3*   Liver Function Tests: No results found for this basename: AST, ALT, ALKPHOS, BILITOT, PROT, ALBUMIN,  in the last 72 hours   CBC:  Recent Labs  08/05/12 0814  WBC 41.7*  NEUTROABS 38.5*  HGB 8.4*  HCT 26.2*  MCV 88.8  PLT 253    Recent Results (from the past 240 hour(s))  CULTURE, BLOOD (ROUTINE X 2)     Status: None   Collection Time  07/30/12 12:30 PM      Result Value Range Status   Specimen Description BLOOD LEFT ARM   Final   Special Requests BOTTLES DRAWN AEROBIC AND ANAEROBIC  8 CC EACH   Final   Culture  Setup Time 07/31/2012 19:44   Final   Culture     Final   Value: GROUP A STREP (S.PYOGENES) ISOLATED     Note: Gram Stain Report Called to,Read Back By and Verified With: SMITH J @ 0150 07/31/12 BY Cristela Blue J Performed at [facility] CRITICAL RESULT CALLED TO, READ BACK BY AND VERIFIED WITH: SHARON WARD 08/01/12 @ 10:04AM BY RUSCA.   Report Status 08/01/2012 FINAL   Final  CULTURE, BLOOD (ROUTINE X 2)     Status: None   Collection Time    07/30/12 12:45 PM      Result Value Range Status   Specimen Description BLOOD RIGHT ARM   Final   Special Requests BOTTLES DRAWN AEROBIC AND ANAEROBIC  6 CC EACH   Final   Culture  Setup Time 07/31/2012 02:50   Final   Culture     Final   Value: GROUP A STREP (S.PYOGENES) ISOLATED     Note: Gram Stain Report Called to,Read Back By and Verified With: SMITH J @ 0150 07/31/12 BY Cristela Blue J Performed at [facility] CRITICAL RESULT CALLED TO, READ BACK BY AND VERIFIED WITH: SHARON WARD 08/01/12 @ 10:04AM BY RUSCA.   Report Status 08/01/2012 FINAL   Final  MRSA PCR SCREENING     Status: None   Collection Time    07/30/12  7:22 PM      Result Value Range Status   MRSA by PCR NEGATIVE  NEGATIVE Final   Comment:            The GeneXpert MRSA Assay (FDA     approved  for NASAL specimens     only), is one component of a     comprehensive MRSA colonization     surveillance program. It is not     intended to diagnose MRSA     infection nor to guide or     monitor treatment for     MRSA infections.  CULTURE, EXPECTORATED SPUTUM-ASSESSMENT     Status: None   Collection Time    08/01/12  1:01 PM      Result Value Range Status   Specimen Description SPU   Final   Special Requests NONE   Final   Sputum evaluation     Final   Value: MICROSCOPIC FINDINGS SUGGEST THAT THIS SPECIMEN IS NOT REPRESENTATIVE OF LOWER RESPIRATORY SECRETIONS. PLEASE RECOLLECT.     Gram Stain Report Called to,Read Back By and Verified With: WARD, S. AT 15:03PM ON 08/01/12 BY PRUITT, C.   Report Status 08/01/2012 FINAL   Final  BODY FLUID CULTURE     Status: None   Collection Time    08/02/12  9:42 AM      Result Value Range Status   Specimen Description FLUID KNEE JOINT   Final   Special Requests NONE   Final   Gram Stain     Final   Value: WBC PRESENT, PREDOMINANTLY PMN     NO ORGANISMS SEEN   Culture NO GROWTH 1 DAY   Final   Report Status PENDING   Incomplete     Hospital Course: He was admitted with what appeared to be sepsis. His blood cultures were positive for Streptococcus. He had what appeared  to be her healthcare associated pneumonia. He also had an inflamed knee and that was cultured but did not show any organisms and was felt to show calcium oxalate deposits. He was given an injection in that knee. He was started on cefepime and vancomycin for what appeared to be her healthcare associated pneumonia. He was given Neulastin for help with his white blood cell count. He had atrial fibrillation and initially had rapid response that better. He was very weak and had treatment for that and had PT. It was felt he was going to need skilled care facility placement. He is at maximum hospital benefit now.  Discharge Exam: Blood pressure 98/63, pulse 112, temperature 97.9 F (36.6  C), temperature source Oral, resp. rate 22, height 5\' 7"  (1.702 m), weight 96.3 kg (212 lb 4.9 oz), SpO2 97.00%. He is awake and alert. His chest shows some wheezing. His heart is irregular. His knee is mildly swollen and shows an injection site.  Disposition: To skilled care facility for physical therapy speech and occupational therapy as needed. He will require 7 more days of intravenous cefepime and vancomycin. He needs to have pro time daily. His pro time is managed by the Coumadin clinic at [facility] in [place]. Medications are as listed above      Discharge Orders   Future Appointments Provider Department Dept Phone   08/17/2012 11:00 AM Kathlen Brunswick, MD [facility] [place] [phone]   08/23/2012 10:50 AM [facility] [phone]   08/24/2012 10:15 AM [facility] [phone]   10/05/2012 10:30 AM Randall An, MD [facility] [phone]   Future Orders Complete By Expires     Discharge to SNF when bed available  As directed          Signed: Hades Mathew [name] Pager [phone]  08/05/2012, 9:00 AM

## 2012-08-05 NOTE — Clinical Social Work Placement (Signed)
Clinical Social Work Department CLINICAL SOCIAL WORK PLACEMENT NOTE 08/05/2012  Patient:  Charles [name], Charles [name]  Account Number:  [id number] Admit date:  07/30/2012  Clinical Social Worker:  Derenda Fennel, LCSW  Date/time:  08/03/2012 02:30 PM  Clinical Social Work is seeking post-discharge placement for this patient at the following level of care:   SKILLED NURSING   (*CSW will update this form in Epic as items are completed)   08/03/2012  Patient/family provided with [facility] Department of Clinical Social Work's list of facilities offering this level of care within the geographic area requested by the patient (or if unable, by the patient's family).  08/03/2012  Patient/family informed of their freedom to choose among providers that offer the needed level of care, that participate in Medicare, Medicaid or managed care program needed by the patient, have an available bed and are willing to accept the patient.  08/03/2012  Patient/family informed of [facility]' ownership interest in [facility], as well as of the fact that they are under no obligation to receive care at this facility.  PASARR submitted to EDS on 08/03/2012 PASARR number received from E[employer] on 08/03/2012  FL2 transmitted to all facilities in geographic area requested by pt/family on  08/03/2012 FL2 transmitted to all facilities within larger geographic area on   Patient informed that his/her managed care company has contracts with or will negotiate with  certain facilities, including the following:     Patient/family informed of bed offers received:  08/04/2012 Patient chooses bed at [facility] [place] Physician recommends and patient chooses bed at  [facility] [place]  Patient to be transferred to [facility] [place] on  08/05/2012 Patient to be transferred to facility by [facility]  The following physician request were entered in Epic:   Additional Comments:  Derenda Fennel, LCSW [phone]

## 2012-08-05 NOTE — Clinical Social Work Note (Signed)
Pt d/c today to [facility]. Pt, pt's wife, and facility aware and agreeable. Pt to transfer via [facility]. [facility] notified of portacath for IV fluids and antibiotics. D/C summary faxed.  Derenda Fennel, [place] [phone]

## 2012-08-05 NOTE — Progress Notes (Signed)
Nutrition Brief Note  RD pulled to chart due to LOS.   Body mass index is 33.24 kg/(m^2). Patient meets criteria for obesity, class I based on current BMI.   Current diet order is Heart Healthy, patient is consuming approximately 50-75% of meals at this time. Labs and medications reviewed.   No nutrition interventions warranted at this time. If nutrition issues arise, please consult RD.   Melody Haver, RD, LDN Pager: [phone]

## 2012-08-05 NOTE — Progress Notes (Signed)
Physical Therapy Treatment Patient Details Name: Charles [name] MRN: [id number] DOB: [birth year] Today's Date: 08/05/2012 Time: 0821-0930 PT Time Calculation (min): 69 min  PT Assessment / Plan / Recommendation Comments on Treatment Session  Pt very coorperative and willing to complete therapy following explaination for benefits.  Pt continues to be limited by pain in Lt knee, did state pain reduced following bed exercises.  Pt with total max assistance with SPT with max cueing for handplacement and weight bearing with transfer.  Pt left in chair with alarm set, call bell within reach and wife in room.    Follow Up Recommendations        Does the patient have the potential to tolerate intense rehabilitation     Barriers to Discharge        Equipment Recommendations       Recommendations for Other Services    Frequency     Plan      Precautions / Restrictions Precautions Precautions: Fall Required Braces or Orthoses: Other Brace/Splint Restrictions Weight Bearing Restrictions: Yes LLE Weight Bearing: Partial weight bearing    Mobility  Bed Mobility Bed Mobility: Left Sidelying to Sit Left Sidelying to Sit: 3: Mod assist;With rails Details for Bed Mobility Assistance: mod cueing required for handplacement to assist with sidelying to sit Transfers Transfers: Sit to Stand;Stand to Sit;Stand Pivot Transfers Sit to Stand: 1: +1 Total assist;From bed Stand to Sit: 1: +1 Total assist;To chair/3-in-1 Stand Pivot Transfers: 1: +1 Total assist Details for Transfer Assistance: pt is able to use a walker with max assist to pivot to chair    Exercises General Exercises - Lower Extremity Ankle Circles/Pumps: AROM;Both;10 reps Quad Sets: AROM;Both;10 reps Gluteal Sets: AROM;10 reps Heel Slides: AAROM;Both;10 reps   PT Diagnosis:    PT Problem List:   PT Treatment Interventions:     PT Goals Acute Rehab PT Goals PT Goal: Supine/Side to Sit - Progress: Progressing toward  goal PT Goal: Sit to Supine/Side - Progress: Progressing toward goal PT Goal: Sit to Stand - Progress: Progressing toward goal PT Goal: Stand to Sit - Progress: Progressing toward goal PT Transfer Goal: Bed to Chair/Chair to Bed - Progress: Progressing toward goal PT Goal: Ambulate - Progress: Not met  Visit Information  Last PT Received On: 08/05/12    Subjective Data  Subjective: Pt reported receiving shot for Lt knee this morning, increased knee pain but is willilng to complete some bed exercises   Cognition  Cognition Arousal/Alertness: Awake/alert Behavior During Therapy: WFL for tasks assessed/performed Overall Cognitive Status: Within Functional Limits for tasks assessed    Balance     End of Session PT - End of Session Equipment Utilized During Treatment: Gait belt;Oxygen Activity Tolerance: Patient limited by fatigue;Patient limited by pain Patient left: in chair;with call bell/phone within reach;with chair alarm set;with family/visitor present Nurse Communication: Mobility status   GP     Juel Burrow 08/05/2012, 11:03 AM

## 2012-08-06 LAB — BODY FLUID CULTURE: Culture: NO GROWTH

## 2012-08-07 ENCOUNTER — Other Ambulatory Visit: Payer: Self-pay

## 2012-08-07 ENCOUNTER — Inpatient Hospital Stay (HOSPITAL_COMMUNITY)
Admission: EM | Admit: 2012-08-07 | Discharge: 2012-08-13 | DRG: 871 | Disposition: A | Discharge status: Home Health | Payer: Medicare Other | Attending: Family Medicine | Admitting: Family Medicine

## 2012-08-07 ENCOUNTER — Encounter (HOSPITAL_COMMUNITY): Payer: Self-pay | Admitting: *Deleted

## 2012-08-07 ENCOUNTER — Emergency Department (HOSPITAL_COMMUNITY): Payer: Medicare Other

## 2012-08-07 DIAGNOSIS — I872 Venous insufficiency (chronic) (peripheral): Secondary | ICD-10-CM | POA: Diagnosis present

## 2012-08-07 DIAGNOSIS — C4491 Basal cell carcinoma of skin, unspecified: Secondary | ICD-10-CM | POA: Diagnosis present

## 2012-08-07 DIAGNOSIS — R7309 Other abnormal glucose: Secondary | ICD-10-CM | POA: Diagnosis present

## 2012-08-07 DIAGNOSIS — F411 Generalized anxiety disorder: Secondary | ICD-10-CM | POA: Diagnosis present

## 2012-08-07 DIAGNOSIS — R739 Hyperglycemia, unspecified: Secondary | ICD-10-CM | POA: Diagnosis present

## 2012-08-07 DIAGNOSIS — M1710 Unilateral primary osteoarthritis, unspecified knee: Secondary | ICD-10-CM | POA: Diagnosis present

## 2012-08-07 DIAGNOSIS — I428 Other cardiomyopathies: Secondary | ICD-10-CM | POA: Diagnosis present

## 2012-08-07 DIAGNOSIS — I4892 Unspecified atrial flutter: Secondary | ICD-10-CM | POA: Diagnosis present

## 2012-08-07 DIAGNOSIS — Z6831 Body mass index (BMI) 31.0-31.9, adult: Secondary | ICD-10-CM

## 2012-08-07 DIAGNOSIS — I4891 Unspecified atrial fibrillation: Secondary | ICD-10-CM | POA: Diagnosis present

## 2012-08-07 DIAGNOSIS — R6520 Severe sepsis without septic shock: Secondary | ICD-10-CM | POA: Diagnosis present

## 2012-08-07 DIAGNOSIS — I10 Essential (primary) hypertension: Secondary | ICD-10-CM | POA: Diagnosis present

## 2012-08-07 DIAGNOSIS — A0472 Enterocolitis due to Clostridium difficile, not specified as recurrent: Secondary | ICD-10-CM | POA: Diagnosis present

## 2012-08-07 DIAGNOSIS — Z79899 Other long term (current) drug therapy: Secondary | ICD-10-CM

## 2012-08-07 DIAGNOSIS — M19019 Primary osteoarthritis, unspecified shoulder: Secondary | ICD-10-CM | POA: Diagnosis present

## 2012-08-07 DIAGNOSIS — C8299 Follicular lymphoma, unspecified, extranodal and solid organ sites: Secondary | ICD-10-CM | POA: Diagnosis present

## 2012-08-07 DIAGNOSIS — A419 Sepsis, unspecified organism: Principal | ICD-10-CM | POA: Diagnosis present

## 2012-08-07 DIAGNOSIS — Z8249 Family history of ischemic heart disease and other diseases of the circulatory system: Secondary | ICD-10-CM

## 2012-08-07 DIAGNOSIS — J189 Pneumonia, unspecified organism: Secondary | ICD-10-CM | POA: Diagnosis present

## 2012-08-07 DIAGNOSIS — E669 Obesity, unspecified: Secondary | ICD-10-CM | POA: Diagnosis present

## 2012-08-07 DIAGNOSIS — Z87891 Personal history of nicotine dependence: Secondary | ICD-10-CM

## 2012-08-07 DIAGNOSIS — I509 Heart failure, unspecified: Secondary | ICD-10-CM | POA: Diagnosis present

## 2012-08-07 DIAGNOSIS — J9620 Acute and chronic respiratory failure, unspecified whether with hypoxia or hypercapnia: Secondary | ICD-10-CM | POA: Diagnosis present

## 2012-08-07 DIAGNOSIS — I5031 Acute diastolic (congestive) heart failure: Secondary | ICD-10-CM | POA: Diagnosis present

## 2012-08-07 DIAGNOSIS — Z7901 Long term (current) use of anticoagulants: Secondary | ICD-10-CM

## 2012-08-07 DIAGNOSIS — J309 Allergic rhinitis, unspecified: Secondary | ICD-10-CM | POA: Diagnosis present

## 2012-08-07 DIAGNOSIS — C8290 Follicular lymphoma, unspecified, unspecified site: Secondary | ICD-10-CM | POA: Diagnosis present

## 2012-08-07 DIAGNOSIS — E876 Hypokalemia: Secondary | ICD-10-CM | POA: Diagnosis present

## 2012-08-07 LAB — URINALYSIS, ROUTINE W REFLEX MICROSCOPIC
Glucose, UA: NEGATIVE mg/dL
Leukocytes, UA: NEGATIVE
Nitrite: NEGATIVE
pH: 6 (ref 5.0–8.0)

## 2012-08-07 LAB — COMPREHENSIVE METABOLIC PANEL
AST: 27 U/L (ref 0–37)
Albumin: 2.5 g/dL — ABNORMAL LOW (ref 3.5–5.2)
Calcium: 7.8 mg/dL — ABNORMAL LOW (ref 8.4–10.5)
Creatinine, Ser: 0.79 mg/dL (ref 0.50–1.35)
Total Protein: 6.5 g/dL (ref 6.0–8.3)

## 2012-08-07 LAB — CBC WITH DIFFERENTIAL/PLATELET
Basophils Absolute: 0 10*3/uL (ref 0.0–0.1)
Basophils Relative: 0 % (ref 0–1)
Eosinophils Absolute: 0.1 10*3/uL (ref 0.0–0.7)
Eosinophils Relative: 0 % (ref 0–5)
HCT: 29.1 % — ABNORMAL LOW (ref 39.0–52.0)
MCHC: 32.3 g/dL (ref 30.0–36.0)
MCV: 87.1 fL (ref 78.0–100.0)
Monocytes Absolute: 0.5 10*3/uL (ref 0.1–1.0)
Neutro Abs: 19 10*3/uL — ABNORMAL HIGH (ref 1.7–7.7)
RDW: 19.5 % — ABNORMAL HIGH (ref 11.5–15.5)

## 2012-08-07 LAB — BLOOD GAS, ARTERIAL
Acid-base deficit: 4.8 mmol/L — ABNORMAL HIGH (ref 0.0–2.0)
FIO2: 100 %
Inspiratory PAP: 16
O2 Saturation: 97.8 %
RATE: 8 resp/min
TCO2: 19.6 mmol/L (ref 0–100)
pO2, Arterial: 111 mmHg — ABNORMAL HIGH (ref 80.0–100.0)

## 2012-08-07 LAB — URINE MICROSCOPIC-ADD ON

## 2012-08-07 MED ORDER — HEPARIN SOD (PORK) LOCK FLUSH 100 UNIT/ML IV SOLN: 500.0000 [IU] | INTRAVENOUS | Status: DC | PRN

## 2012-08-07 MED ORDER — CEFEPIME-DEXTROSE 1 GM/50ML IV SOLR: 1.0000 g | Freq: Three times a day (TID) | INTRAVENOUS | Status: DC

## 2012-08-07 MED ORDER — VANCOMYCIN HCL IN DEXTROSE 1-5 GM/200ML-% IV SOLN
1000.0000 mg | Freq: Once | INTRAVENOUS | Status: AC
  Administered 2012-08-07: 1000 mg via INTRAVENOUS
  Filled 2012-08-07: qty 200

## 2012-08-07 MED ORDER — POTASSIUM CHLORIDE 10 MEQ/100ML IV SOLN
10.0000 meq | INTRAVENOUS | Status: AC
  Administered 2012-08-07 – 2012-08-08 (×2): 10 meq via INTRAVENOUS
  Filled 2012-08-07: qty 200

## 2012-08-07 MED ORDER — FUROSEMIDE 10 MG/ML IJ SOLN
40.0000 mg | Freq: Two times a day (BID) | INTRAMUSCULAR | Status: AC
  Administered 2012-08-07 – 2012-08-10 (×7): 40 mg via INTRAVENOUS
  Filled 2012-08-07 (×7): qty 4

## 2012-08-07 MED ORDER — PANTOPRAZOLE SODIUM 40 MG IV SOLR
40.0000 mg | INTRAVENOUS | Status: DC
  Administered 2012-08-07 – 2012-08-10 (×4): 40 mg via INTRAVENOUS
  Filled 2012-08-07 (×5): qty 40

## 2012-08-07 MED ORDER — VANCOMYCIN HCL IN DEXTROSE 1-5 GM/200ML-% IV SOLN
INTRAVENOUS | Status: AC
  Filled 2012-08-07: qty 200

## 2012-08-07 MED ORDER — VANCOMYCIN HCL 10 G IV SOLR
1250.0000 mg | Freq: Two times a day (BID) | INTRAVENOUS | Status: DC
  Filled 2012-08-07 (×17): qty 1250

## 2012-08-07 MED ORDER — SODIUM CHLORIDE 0.9 % IJ SOLN
3.0000 mL | Freq: Two times a day (BID) | INTRAMUSCULAR | Status: DC
  Administered 2012-08-08 – 2012-08-12 (×4): 3 mL via INTRAVENOUS

## 2012-08-07 MED ORDER — INSULIN ASPART 100 UNIT/ML ~~LOC~~ SOLN
0.0000 [IU] | SUBCUTANEOUS | Status: DC
  Administered 2012-08-08 – 2012-08-12 (×7): 1 [IU] via SUBCUTANEOUS

## 2012-08-07 MED ORDER — ACETAMINOPHEN 650 MG RE SUPP
650.0000 mg | Freq: Four times a day (QID) | RECTAL | Status: DC | PRN
  Administered 2012-08-08 (×2): 650 mg via RECTAL
  Filled 2012-08-07 (×4): qty 1

## 2012-08-07 MED ORDER — ASPIRIN 300 MG RE SUPP
300.0000 mg | Freq: Every day | RECTAL | Status: DC
  Administered 2012-08-08 – 2012-08-10 (×3): 300 mg via RECTAL
  Filled 2012-08-07 (×3): qty 1

## 2012-08-07 MED ORDER — ONDANSETRON HCL 4 MG/2ML IJ SOLN: 4.0000 mg | INTRAMUSCULAR | Status: DC | PRN

## 2012-08-07 MED ORDER — DEXTROSE 5 % IV SOLN
1.0000 g | Freq: Three times a day (TID) | INTRAVENOUS | Status: DC
  Administered 2012-08-08 – 2012-08-12 (×13): 1 g via INTRAVENOUS
  Filled 2012-08-07 (×14): qty 1

## 2012-08-07 MED ORDER — DEXTROSE 5 % IV SOLN
1.0000 g | Freq: Three times a day (TID) | INTRAVENOUS | Status: DC
  Filled 2012-08-07 (×6): qty 1

## 2012-08-07 MED ORDER — SODIUM CHLORIDE 0.9 % IV SOLN: 250.0000 mL | INTRAVENOUS | Status: DC | PRN

## 2012-08-07 MED ORDER — FUROSEMIDE 10 MG/ML IJ SOLN
40.0000 mg | Freq: Once | INTRAMUSCULAR | Status: AC
  Administered 2012-08-07: 40 mg via INTRAVENOUS
  Filled 2012-08-07: qty 4

## 2012-08-07 MED ORDER — ENALAPRILAT 1.25 MG/ML IV SOLN
0.6250 mg | Freq: Four times a day (QID) | INTRAVENOUS | Status: DC
  Administered 2012-08-07 – 2012-08-11 (×9): 0.625 mg via INTRAVENOUS
  Filled 2012-08-07 (×10): qty 2

## 2012-08-07 MED ORDER — SODIUM CHLORIDE 0.9 % IJ SOLN
3.0000 mL | INTRAMUSCULAR | Status: DC | PRN
  Administered 2012-08-11: 3 mL via INTRAVENOUS

## 2012-08-07 MED ORDER — SODIUM CHLORIDE 0.9 % IJ SOLN
Freq: Two times a day (BID) | INTRAMUSCULAR | Status: DC
  Administered 2012-08-07 – 2012-08-08 (×2): 10 mL via INTRAVENOUS
  Administered 2012-08-09: 3 mL via INTRAVENOUS
  Administered 2012-08-11: 6 mL via INTRAVENOUS
  Administered 2012-08-11: 3 mL via INTRAVENOUS
  Administered 2012-08-12: 10 mL via INTRAVENOUS

## 2012-08-07 NOTE — ED Notes (Signed)
Spoke w/ dr Orvan Falconer & was advised to go ahead & take pt to ICU & he would see pt there.

## 2012-08-07 NOTE — ED Provider Notes (Signed)
History  This chart was scribed for Geoffery Lyons, MD by Bennett Scrape, ED Scribe. This patient was seen in room APA02/APA02 and the patient's care was started at 5:00 pm.   CSN: [id number]  Arrival date & time 08/07/12  1647   First MD Initiated Contact with Patient 08/07/12 1654     Level 5 caveat for respiratory distress   Chief Complaint  Patient presents with  . Shortness of Breath    The history is provided by the spouse. No language interpreter was used.    HPI Comments: Charles [name] is a 72 y.o. male who presents to the Emergency Department from [facility] for sudden onset SOB which started today. EMS reported that the pt was 85% on room air upon their arrival and the pt was placed on c-pap with improvement to 97%. Pt was recently diagnosed with sepsis from PNA and is currently on antibiotics. He was discharged 2 days ago after a 6 day stay for PNA. Wife denies that the pt was intubated at the time. She reports that he was at [facility] for PT after his admission. She reports that the pt received two xanax earlier today at his nursing home which is more than normal. She states that he normally takes 1/4th a dose as needed for anxiety and asked why he was receiving so much. Wife was told that it was the doctor's orders. She reports that she noticed the pt gurgling and states the he appeared to be trying to cough but was too sedated. Wife states that pt takes lasix as needed and this his legs currently appear more swollen than normal. She also reports the pt twisted his knee over a week ago which was treated with a cortisone shot 6 days ago. Wife states that it is the pt's third bout of pneumonia in his lower-left lung. Pt has a history of HTN, non-Hodgkins lymphoma currently in remission and A. Fib. Wife states that the pt's last treatment was 2 weeks ago.   Dr. [name] is PCP  Past Medical History  Diagnosis Date  . Hypertension   . Atrial fibrillation 10/2008  . Nonischemic  cardiomyopathy 02/2009    Presented with CHF; EF of 25-30%; Nonobstructive ASCVD-worst lesion 40% on coronary angiography  . Peptic ulcer disease   . Degenerative joint disease     Knees and shoulders  . Pneumonia 2010  . Low TSH level     Borderline  . Allergic rhinitis   . Anxiety   . Drug abuse   . Tobacco abuse     50 pack years  . 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 Basal cell carcinoma     Past Surgical History  Procedure Laterality Date  . Neck lesion biopsy  June 24, 2010    Follicular lymphoma  . Skin cancer excision      under left breast; variously described as melanoma and basal cell  . Rotator cuff repair      right  . Patella fracture surgery  1975  . Hematoma evacuation      after melanoma removal  . Portacath placement  07/12/2010  . Colonoscopy  2009    Also underwent an EGD; patient reports no significant findings  . Bone marrow aspiration  06/2012  . Bone marrow biopsy  06/2012  . Fungal infection      Family History  Problem Relation Age of Onset  . Heart attack Father   . Coronary artery disease Other     History  Substance Use Topics  . Smoking status: Former Smoker -- 1.00 packs/day for 50 years    Types: Cigarettes    Quit date: 04/12/2012  . Smokeless tobacco: Not on file  . Alcohol Use: No      Review of Systems  Unable to perform ROS: Severe respiratory distress    Allergies  Statins  Home Medications   Current Outpatient Rx  Name  Route  Sig  Dispense  Refill  . acetaminophen (TYLENOL) 325 MG tablet   Oral   Take 2 tablets (650 mg total) by mouth every 6 (six) hours as needed for fever.         . ALPRAZolam (XANAX) 0.5 MG tablet   Oral   Take 1 tablet (0.5 mg total) by mouth 4 (four) times daily.      0   . calcium citrate (CALCITRATE - DOSED IN MG ELEMENTAL CALCIUM) 950 MG tablet   Oral   Take 1 tablet (200 mg of elemental calcium total) by mouth 2 (two) times daily.         . carvedilol (COREG) 25 MG tablet    Oral   Take 1 tablet (25 mg total) by mouth 2 (two) times daily with a meal.         . denosumab (PROLIA) 60 MG/ML SOLN injection   Subcutaneous   Inject 60 mg into the skin every 6 (six) months. Administer in upper arm, thigh, or abdomen         . dextrose 5 % SOLN 50 mL with ceFEPIme 1 G SOLR 1 g   Intravenous   Inject 1 g into the vein every 8 (eight) hours.         Marland Kitchen diltiazem (CARDIZEM SR) 120 MG 12 hr capsule   Oral   Take 1 capsule (120 mg total) by mouth every 12 (twelve) hours.         . filgrastim (NEUPOGEN) 480 MCG/1.6ML injection   Subcutaneous   Inject 1.6 mLs (480 mcg total) into the skin daily at 6 PM.   1.6 mL   0   . guaiFENesin (MUCINEX) 600 MG 12 hr tablet   Oral   Take 2 tablets (1,200 mg total) by mouth 2 (two) times daily.         Marland Kitchen HYDROcodone-acetaminophen (NORCO/VICODIN) 5-325 MG per tablet   Oral   Take 1 tablet by mouth every 4 (four) hours as needed.   30 tablet   0   . lovastatin (MEVACOR) 10 MG tablet   Oral   Take 10 mg by mouth daily.          . Multiple Vitamins-Iron (ONE-TABLET-DAILY/IRON PO)   Oral   Take 1 tablet by mouth daily.         Marland Kitchen omeprazole (PRILOSEC) 20 MG capsule   Oral   Take 20 mg by mouth daily.         . pegfilgrastim (NEULASTA) 6 MG/0.6ML injection   Subcutaneous   Inject 6 mg into the skin every 14 (fourteen) days. For a total of 4 doses.         Marland Kitchen PROAIR HFA 108 (90 BASE) MCG/ACT inhaler   Inhalation   Inhale 1 puff into the lungs every 6 (six) hours as needed. For shortness of breath         . sodium chloride 0.9 % infusion   Intravenous   Inject 1,000 mLs into the vein continuous.      0   .  sodium chloride 0.9 % SOLN 250 mL with vancomycin 10 G SOLR 1,250 mg   Intravenous   Inject 1,250 mg into the vein every 12 (twelve) hours.         . traMADol (ULTRAM) 50 MG tablet   Oral   Take 2 tablets (100 mg total) by mouth at bedtime.   30 tablet      . Warfarin - Pharmacist  Dosing Inpatient MISC   Does not apply   5 each by Does not apply route daily.           Triage Vitals: BP 119/76  Pulse 104  Resp 33  SpO2 100%  Physical Exam  Nursing note and vitals reviewed. Constitutional: He is oriented to person, place, and time. He appears distressed.  Pt appear acutely on chronically ill. He is in moderate respiratory distress.   HENT:  Head: Normocephalic and atraumatic.  Eyes: EOM are normal.  Neck: Neck supple. No tracheal deviation present.  Cardiovascular:  He is tachycardic and rate is irregularly irregular.    Pulmonary/Chest: He is in respiratory distress.  He is in moderate respiratory distress. There are bilateral rales throughout both lung fields. He is on bipap and appears to be tolerating this well.   Abdominal: Soft. Bowel sounds are normal. There is no tenderness.  Musculoskeletal: Normal range of motion.  There is 3 plus pitting edema of bilateral lower extremities.   Neurological: He is alert and oriented to person, place, and time.  Skin: Skin is warm. He is diaphoretic.  Psychiatric: He has a normal mood and affect. His behavior is normal.    ED Course  Procedures (including critical care time)  Medications  furosemide (LASIX) injection 40 mg (40 mg Intravenous Given 08/07/12 1721)    DIAGNOSTIC STUDIES: Oxygen Saturation is 100% on c-pap, adequate by my interpretation.    COORDINATION OF CARE: 5:06 PM- Discussed treatment plan with patient's wife which includes CXR, lasix, UA, ABG also.    Labs Reviewed - No data to display No results found.   No diagnosis found.   Date: 08/07/2012  Rate: 105  Rhythm: atrial flutter  QRS Axis: normal  Intervals: normal  ST/T Wave abnormalities: nonspecific T wave changes  Conduction Disutrbances:none  Narrative Interpretation:   Old EKG Reviewed: unchanged    MDM  The patient arrives by EMS in acute respiratory distress requiring bipap to maintain his sats.  This was  continued in the ED.    Workup was initiated revealing markedly elevated bnp, chf on the chest xray, consistent with acute pulmonary edema.  He was given lasix, bipap was continued and he appears to be improving clinically.  He is atrial flutter which he has been in before but there are no other ekg changes to suggest mi, and troponin is negative.  I have spoken with Dr. Orvan Falconer who wants patient admitted to Dr. [name].    CRITICAL CARE Performed by: Geoffery Lyons Total critical care time: 30 minutes Critical care time was exclusive of separately billable procedures and treating other patients. Critical care was necessary to treat or prevent imminent or life-threatening deterioration. Critical care was time spent personally by me on the following activities: development of treatment plan with patient and/or surrogate as well as nursing, discussions with consultants, evaluation of patient's response to treatment, examination of patient, obtaining history from patient or surrogate, ordering and performing treatments and interventions, ordering and review of laboratory studies, ordering and review of radiographic studies, pulse oximetry and re-evaluation  of patient's condition.     I personally performed the services described in this documentation, which was scribed in my presence. The recorded information has been reviewed and is accurate.      Geoffery Lyons, MD 08/07/12 [phone]

## 2012-08-07 NOTE — Progress Notes (Signed)
Pt transported from ER to ICU and placed back on BIPAP on previous settings.  Pt tolerated well.  RT will continue to monitor.

## 2012-08-07 NOTE — ED Notes (Signed)
Sent from [facility] for evaluation of respiratory distress

## 2012-08-08 LAB — BLOOD GAS, ARTERIAL
Acid-base deficit: 0.6 mmol/L (ref 0.0–2.0)
Bicarbonate: 23.1 mEq/L (ref 20.0–24.0)
Delivery systems: POSITIVE
Drawn by: 27407
FIO2: 0.6 %
Inspiratory PAP: 16
Patient temperature: 37
pCO2 arterial: 35.3 mmHg (ref 35.0–45.0)
pH, Arterial: 7.432 (ref 7.350–7.450)

## 2012-08-08 LAB — BASIC METABOLIC PANEL
BUN: 19 mg/dL (ref 6–23)
CO2: 25 mEq/L (ref 19–32)
Calcium: 7.3 mg/dL — ABNORMAL LOW (ref 8.4–10.5)
Calcium: 7.5 mg/dL — ABNORMAL LOW (ref 8.4–10.5)
Chloride: 109 mEq/L (ref 96–112)
Creatinine, Ser: 0.81 mg/dL (ref 0.50–1.35)
Creatinine, Ser: 0.8 mg/dL (ref 0.50–1.35)
GFR calc Af Amer: >90 mL/min (ref >90)
GFR calc Af Amer: >90 mL/min (ref >90)
GFR calc non Af Amer: 86 mL/min — ABNORMAL LOW (ref >90)
Sodium: 143 mEq/L (ref 135–145)

## 2012-08-08 LAB — GLUCOSE, CAPILLARY
Glucose-Capillary: 71 mg/dL (ref 70–99)
Glucose-Capillary: 103 mg/dL — ABNORMAL HIGH (ref 70–99)
Glucose-Capillary: 117 mg/dL — ABNORMAL HIGH (ref 70–99)

## 2012-08-08 LAB — PROTIME-INR: Prothrombin Time: 28.2 seconds — ABNORMAL HIGH (ref 11.6–15.2)

## 2012-08-08 LAB — MAGNESIUM: Magnesium: 2.1 mg/dL (ref 1.5–2.5)

## 2012-08-08 LAB — HEMOGLOBIN A1C
Hgb A1c MFr Bld: 5.5 % (ref <5.7)
Mean Plasma Glucose: 111 mg/dL (ref <117)

## 2012-08-08 MED ORDER — ACETAMINOPHEN 325 MG PO TABS
650.0000 mg | ORAL_TABLET | Freq: Four times a day (QID) | ORAL | Status: DC | PRN
  Administered 2012-08-08 – 2012-08-12 (×12): 650 mg via ORAL
  Filled 2012-08-08 (×14): qty 2

## 2012-08-08 MED ORDER — WARFARIN - PHARMACIST DOSING INPATIENT: Status: DC

## 2012-08-08 MED ORDER — DIGOXIN 0.25 MG/ML IJ SOLN
0.1250 mg | Freq: Once | INTRAMUSCULAR | Status: AC
  Administered 2012-08-08: 0.125 mg via INTRAVENOUS
  Filled 2012-08-08: qty 2

## 2012-08-08 MED ORDER — WARFARIN SODIUM 2.5 MG PO TABS
2.5000 mg | ORAL_TABLET | Freq: Once | ORAL | Status: AC
  Administered 2012-08-08: 2.5 mg via ORAL
  Filled 2012-08-08: qty 1

## 2012-08-08 MED ORDER — DIGOXIN 0.25 MG/ML IJ SOLN
0.2500 mg | Freq: Once | INTRAMUSCULAR | Status: AC
  Administered 2012-08-08: 0.25 mg via INTRAVENOUS
  Filled 2012-08-08: qty 2

## 2012-08-08 MED ORDER — LIVING BETTER WITH HEART FAILURE BOOK
Freq: Once | Status: DC
  Filled 2012-08-08: qty 1

## 2012-08-08 MED ORDER — DILTIAZEM HCL 100 MG IV SOLR
5.0000 mg/h | INTRAVENOUS | Status: DC
  Administered 2012-08-08 (×2): 15 mg/h via INTRAVENOUS
  Administered 2012-08-08: 5 mg/h via INTRAVENOUS
  Administered 2012-08-09 – 2012-08-10 (×5): 15 mg/h via INTRAVENOUS
  Administered 2012-08-10: 20 mg/h via INTRAVENOUS
  Administered 2012-08-10 – 2012-08-11 (×3): 15 mg/h via INTRAVENOUS
  Filled 2012-08-08: qty 100

## 2012-08-08 MED ORDER — VANCOMYCIN HCL 10 G IV SOLR
1250.0000 mg | Freq: Two times a day (BID) | INTRAVENOUS | Status: DC
  Administered 2012-08-08 – 2012-08-09 (×2): 1250 mg via INTRAVENOUS
  Filled 2012-08-08 (×5): qty 1250

## 2012-08-08 MED ORDER — DEXTROSE 5 % IV SOLN
INTRAVENOUS | Status: AC
  Filled 2012-08-08: qty 1

## 2012-08-08 NOTE — Progress Notes (Signed)
ANTIBIOTIC CONSULT NOTE - INITIAL  Pharmacy Consult for Vancomycin and Cefepime Indication: pneumonia  Allergies  Allergen Reactions  . Statins     Intolerant secondary to severe myalgias    Patient Measurements: Height: 5\' 7"  (170.2 cm) Weight: 219 lb 5.7 oz (99.5 kg) IBW/kg (Calculated) : 66.1   Vital Signs: Temp: 98.1 F (36.7 C) (05/18 0900) Temp src: Axillary (05/18 0400) BP: 132/63 mmHg (05/18 0915) Pulse Rate: 121 (05/18 0900) Intake/Output from previous day: 05/17 0701 - 05/18 0700 In: 450 [IV Piggyback:450] Out: 2900 [Urine:2900] Intake/Output from this shift: Total I/O In: 275.2 [P.O.:220; I.V.:5.2; IV Piggyback:50] Out: 750 [Urine:750]  Labs:  Recent Labs  08/07/12 1800 08/08/12 0550  WBC 20.1*  --   HGB 9.4*  --   PLT 352  --   CREATININE 0.79 0.81   Estimated Creatinine Clearance: 91.3 ml/min (by C-G formula based on Cr of 0.81). No results found for this basename: VANCOTROUGH, Leodis Binet, VANCORANDOM, GENTTROUGH, GENTPEAK, GENTRANDOM, TOBRATROUGH, TOBRAPEAK, TOBRARND, AMIKACINPEAK, AMIKACINTROU, AMIKACIN,  in the last 72 hours   Microbiology: Recent Results (from the past 720 hour(s))  CULTURE, BLOOD (ROUTINE X 2)     Status: None   Collection Time    07/30/12 12:30 PM      Result Value Range Status   Specimen Description BLOOD LEFT ARM   Final   Special Requests BOTTLES DRAWN AEROBIC AND ANAEROBIC  8 CC EACH   Final   Culture  Setup Time 07/31/2012 19:44   Final   Culture     Final   Value: GROUP A STREP (S.PYOGENES) ISOLATED     Note: Gram Stain Report Called to,Read Back By and Verified With: SMITH J @ 0150 07/31/12 BY Cristela Blue J Performed at [facility] CRITICAL RESULT CALLED TO, READ BACK BY AND VERIFIED WITH: SHARON WARD 08/01/12 @ 10:04AM BY RUSCA.   Report Status 08/01/2012 FINAL   Final  CULTURE, BLOOD (ROUTINE X 2)     Status: None   Collection Time    07/30/12 12:45 PM      Result Value Range Status   Specimen Description  BLOOD RIGHT ARM   Final   Special Requests BOTTLES DRAWN AEROBIC AND ANAEROBIC  6 CC EACH   Final   Culture  Setup Time 07/31/2012 02:50   Final   Culture     Final   Value: GROUP A STREP (S.PYOGENES) ISOLATED     Note: Gram Stain Report Called to,Read Back By and Verified With: SMITH J @ 0150 07/31/12 BY Cristela Blue J Performed at [facility] CRITICAL RESULT CALLED TO, READ BACK BY AND VERIFIED WITH: SHARON WARD 08/01/12 @ 10:04AM BY RUSCA.   Report Status 08/01/2012 FINAL   Final  MRSA PCR SCREENING     Status: None   Collection Time    07/30/12  7:22 PM      Result Value Range Status   MRSA by PCR NEGATIVE  NEGATIVE Final   Comment:            The GeneXpert MRSA Assay (FDA     approved for NASAL specimens     only), is one component of a     comprehensive MRSA colonization     surveillance program. It is not     intended to diagnose MRSA     infection nor to guide or     monitor treatment for     MRSA infections.  CULTURE, EXPECTORATED SPUTUM-ASSESSMENT     Status: None  Collection Time    08/01/12  1:01 PM      Result Value Range Status   Specimen Description SPU   Final   Special Requests NONE   Final   Sputum evaluation     Final   Value: MICROSCOPIC FINDINGS SUGGEST THAT THIS SPECIMEN IS NOT REPRESENTATIVE OF LOWER RESPIRATORY SECRETIONS. PLEASE RECOLLECT.     Gram Stain Report Called to,Read Back By and Verified With: WARD, S. AT 15:03PM ON 08/01/12 BY PRUITT, C.   Report Status 08/01/2012 FINAL   Final  BODY FLUID CULTURE     Status: None   Collection Time    08/02/12  9:42 AM      Result Value Range Status   Specimen Description FLUID KNEE JOINT   Final   Special Requests NONE   Final   Gram Stain     Final   Value: WBC PRESENT, PREDOMINANTLY PMN     NO ORGANISMS SEEN   Culture NO GROWTH 3 DAYS   Final   Report Status 08/06/2012 FINAL   Final    Medical History: Past Medical History  Diagnosis Date  . Hypertension   . Atrial fibrillation 10/2008  .  Nonischemic cardiomyopathy 02/2009    Presented with CHF; EF of 25-30%; Nonobstructive ASCVD-worst lesion 40% on coronary angiography  . Peptic ulcer disease   . Degenerative joint disease     Knees and shoulders  . Pneumonia 2010  . Low TSH level     Borderline  . Allergic rhinitis   . Anxiety   . Drug abuse   . Tobacco abuse     50 pack years  . 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 Basal cell carcinoma     Medications:  Scheduled:  . aspirin  300 mg Rectal Daily  . ceFEPime (MAXIPIME) IV  1 g Intravenous Q8H  . enalaprilat  0.625 mg Intravenous Q6H  . furosemide  40 mg Intravenous Q12H  . insulin aspart  0-9 Units Subcutaneous Q4H  . Living Better with Heart Failure Book   Does not apply Once  . pantoprazole (PROTONIX) IV  40 mg Intravenous Q24H  . sodium chloride  3 mL Intravenous Q12H  . sodium chloride   Intravenous Q12H  . vancomycin  1,250 mg Intravenous Q12H  . warfarin  2.5 mg Oral Once  . Warfarin - Pharmacist Dosing Inpatient   Does not apply Q24H   Assessment: Continuation of antibiotics from nursing home. Vancomycin 1250 mg IV every 12 hours and Cefepime 1 GM IV every 8 hours PTA Vancomycin 1 GM IV given in ED last evening Good renal function.  Goal of Therapy:  Vancomycin trough level 15-20 mcg/ml  Plan: Continue Vancomycin 1250 mg IV every 12 hours Vancomycin trough tomorrow  Continue Cefepime 1 GM IV every 8 hours Monitor renal function Duration of therapy per MD Labs per protocol  Raquel James, Banessa Mao Bennett 08/08/2012,9:31 AM

## 2012-08-08 NOTE — Progress Notes (Signed)
ANTICOAGULATION CONSULT NOTE - Initial Consult  Pharmacy Consult for Warfarin Indication: atrial fibrillation  Allergies  Allergen Reactions  . Statins     Intolerant secondary to severe myalgias    Patient Measurements: Height: 5\' 7"  (170.2 cm) Weight: 219 lb 5.7 oz (99.5 kg) IBW/kg (Calculated) : 66.1   Vital Signs: Temp: 98.6 F (37 C) (05/18 0400) Temp src: Axillary (05/18 0400) BP: 123/73 mmHg (05/18 0848) Pulse Rate: 113 (05/18 0848)  Labs:  Recent Labs  08/07/12 1800 08/08/12 0550 08/08/12 0757  HGB 9.4*  --   --   HCT 29.1*  --   --   PLT 352  --   --   LABPROT  --   --  28.2*  INR  --   --  2.82*  CREATININE 0.79 0.81  --     Estimated Creatinine Clearance: 91.3 ml/min (by C-G formula based on Cr of 0.81).   Medical History: Past Medical History  Diagnosis Date  . Hypertension   . Atrial fibrillation 10/2008  . Nonischemic cardiomyopathy 02/2009    Presented with CHF; EF of 25-30%; Nonobstructive ASCVD-worst lesion 40% on coronary angiography  . Peptic ulcer disease   . Degenerative joint disease     Knees and shoulders  . Pneumonia 2010  . Low TSH level     Borderline  . Allergic rhinitis   . Anxiety   . Drug abuse   . Tobacco abuse     50 pack years  . 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 Basal cell carcinoma     Medications:  Scheduled:  . aspirin  300 mg Rectal Daily  . ceFEPime (MAXIPIME) IV  1 g Intravenous Q8H  . enalaprilat  0.625 mg Intravenous Q6H  . furosemide  40 mg Intravenous Q12H  . insulin aspart  0-9 Units Subcutaneous Q4H  . Living Better with Heart Failure Book   Does not apply Once  . pantoprazole (PROTONIX) IV  40 mg Intravenous Q24H  . sodium chloride  3 mL Intravenous Q12H  . sodium chloride   Intravenous Q12H  . vancomycin  1,250 mg Intravenous Q12H    Assessment: Continuation of Warfarin from nursing home. PTA indicates Warfarin 5 mg po daily During last admission (May 2014) PTA had Warfarin as 2.5 mg on Sat,  Tues, Thurs, Sun, 5 mg other days. During last admission, lower dose needed for therapeutic INR, possibly due to medication interactions. INR therapeutic today (2.82)  Goal of Therapy:  INR 2-3 Monitor platelets by anticoagulation protocol: Yes   Plan:  Coumadin 2.5 mg po today. (siince INR at upper range of therapeutic) INR/PT daily CBC, platelets Labs per protocol  Charles [name], Charles [name] 08/08/2012,9:17 AM

## 2012-08-08 NOTE — Consult Note (Signed)
Consult requested by: Dr. [name] Consult requested for healthcare associated pneumonia:  HPI: This is a 72 year old Caucasian male who was discharged from here on the 15th after being admitted for knee pain healthcare associated pneumonia and a follicular lymphoma. He was transferred to a skilled care facility to continue his rehabilitation and in to finish his antibiotics for healthcare associated pneumonia. He was found to be less responsive and has had increasing swelling of his legs. He has a history of CHF with a fairly markedly reduced ejection fraction to 25-30%. Although last transthoracic echoes show ejection fraction greater than 50%. He has been drowsy. According to the nursing staff in the intensive care unit he had scheduled Xanax while he was in the hospital last time but apparently had been refusing it. It is not clear if he was getting all of those doses while he was in the nursing home  Past Medical History  Diagnosis Date  . Hypertension   . Atrial fibrillation 10/2008  . Nonischemic cardiomyopathy 02/2009    Presented with CHF; EF of 25-30%; Nonobstructive ASCVD-worst lesion 40% on coronary angiography  . Peptic ulcer disease   . Degenerative joint disease     Knees and shoulders  . Pneumonia 2010  . Low TSH level     Borderline  . Allergic rhinitis   . Anxiety   . Drug abuse   . Tobacco abuse     50 pack years  . 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 Basal cell carcinoma      Family History  Problem Relation Age of Onset  . Heart attack Father   . Coronary artery disease Other      History   Social History  . Marital Status: Married    Spouse Name: N/A    Number of Children: N/A  . Years of Education: N/A   Occupational History  . Full time    Social History Main Topics  . Smoking status: Former Smoker -- 1.00 packs/day for 50 years    Types: Cigarettes    Quit date: 04/12/2012  . Smokeless tobacco: None  . Alcohol Use: No  . Drug Use: No  . Sexually  Active: None   Other Topics Concern  . None   Social History Narrative   Married   No regular exercise     ROS: He is drowsy and I can't really obtain the review of systems he is more alert than on admission    Objective: Vital signs in last 24 hours: Temp:  [94.5 F (34.7 C)-99 F (37.2 C)] 98.6 F (37 C) (05/18 0400) Pulse Rate:  [82-110] 85 (05/18 0500) Resp:  [24-38] 27 (05/18 0600) BP: (94-119)/(56-87) 118/56 mmHg (05/18 0600) SpO2:  [98 %-100 %] 100 % (05/18 0500) FiO2 (%):  [60 %] 60 % (05/17 1910) Weight:  [99.5 kg (219 lb 5.7 oz)-100.7 kg (222 lb 0.1 oz)] 99.5 kg (219 lb 5.7 oz) (05/18 0500) Weight change:  Last BM Date: 08/07/12  Intake/Output from previous day: 05/17 0701 - 05/18 0700 In: 450 [IV Piggyback:450] Out: 2900 [Urine:2900]  PHYSICAL EXAM He is awake and responsive. His pupils are reactive. His nose and throat clear. His neck is supple. His chest shows rhonchi bilaterally. He is in atrial fibrillation with rapid ventricular response. His abdomen is soft. Extremities showed no edema. CNS shows a she's moving all 4 extremities  Lab Results: Basic Metabolic Panel:  Recent Labs  16/10/96 1800 08/08/12 0550  NA 138 141  K 3.9 3.7  CL 108 109  CO2 22 24  GLUCOSE 195* 89  BUN 22 19  CREATININE 0.79 0.81  CALCIUM 7.8* 7.3*  MG  --  2.1   Liver Function Tests:  Recent Labs  08/07/12 1800  AST 27  ALT 20  ALKPHOS 182*  BILITOT 0.2*  PROT 6.5  ALBUMIN 2.5*   No results found for this basename: LIPASE, AMYLASE,  in the last 72 hours No results found for this basename: AMMONIA,  in the last 72 hours CBC:  Recent Labs  08/05/12 0814 08/07/12 1800  WBC 41.7* 20.1*  NEUTROABS 38.5* 19.0*  HGB 8.4* 9.4*  HCT 26.2* 29.1*  MCV 88.8 87.1  PLT 253 352   Cardiac Enzymes: No results found for this basename: CKTOTAL, CKMB, CKMBINDEX, TROPONINI,  in the last 72 hours BNP:  Recent Labs  08/07/12 1800  PROBNP 14668.0*   D-Dimer: No  results found for this basename: DDIMER,  in the last 72 hours CBG:  Recent Labs  08/08/12 0019 08/08/12 0400  GLUCAP 121* 84   Hemoglobin A1C: No results found for this basename: HGBA1C,  in the last 72 hours Fasting Lipid Panel: No results found for this basename: CHOL, HDL, LDLCALC, TRIG, CHOLHDL, LDLDIRECT,  in the last 72 hours Thyroid Function Tests: No results found for this basename: TSH, T4TOTAL, FREET4, T3FREE, THYROIDAB,  in the last 72 hours Anemia Panel: No results found for this basename: VITAMINB12, FOLATE, FERRITIN, TIBC, IRON, RETICCTPCT,  in the last 72 hours Coagulation: No results found for this basename: LABPROT, INR,  in the last 72 hours Urine Drug Screen: Drugs of Abuse  No results found for this basename: labopia, cocainscrnur, labbenz, amphetmu, thcu, labbarb    Alcohol Level: No results found for this basename: ETH,  in the last 72 hours Urinalysis:  Recent Labs  08/07/12 1709  COLORURINE YELLOW  LABSPEC >1.030*  PHURINE 6.0  GLUCOSEU NEGATIVE  HGBUR NEGATIVE  BILIRUBINUR NEGATIVE  KETONESUR NEGATIVE  PROTEINUR 100*  UROBILINOGEN 0.2  NITRITE NEGATIVE  LEUKOCYTESUR NEGATIVE   Misc. Labs:   ABGS:  Recent Labs  08/08/12 0527  PHART 7.432  PO2ART 191.0*  TCO2 20.5  HCO3 23.1     MICROBIOLOGY: Recent Results (from the past 240 hour(s))  CULTURE, BLOOD (ROUTINE X 2)     Status: None   Collection Time    07/30/12 12:30 PM      Result Value Range Status   Specimen Description BLOOD LEFT ARM   Final   Special Requests BOTTLES DRAWN AEROBIC AND ANAEROBIC  8 CC EACH   Final   Culture  Setup Time 07/31/2012 19:44   Final   Culture     Final   Value: GROUP A STREP (S.PYOGENES) ISOLATED     Note: Gram Stain Report Called to,Read Back By and Verified With: SMITH J @ 0150 07/31/12 BY Cristela Blue J Performed at [facility] CRITICAL RESULT CALLED TO, READ BACK BY AND VERIFIED WITH: SHARON WARD 08/01/12 @ 10:04AM BY RUSCA.   Report  Status 08/01/2012 FINAL   Final  CULTURE, BLOOD (ROUTINE X 2)     Status: None   Collection Time    07/30/12 12:45 PM      Result Value Range Status   Specimen Description BLOOD RIGHT ARM   Final   Special Requests BOTTLES DRAWN AEROBIC AND ANAEROBIC  6 CC EACH   Final   Culture  Setup Time 07/31/2012 02:50   Final   Culture  Final   Value: GROUP A STREP (S.PYOGENES) ISOLATED     Note: Gram Stain Report Called to,Read Back By and Verified With: SMITH J @ 0150 07/31/12 BY Cristela Blue J Performed at [facility] CRITICAL RESULT CALLED TO, READ BACK BY AND VERIFIED WITH: SHARON WARD 08/01/12 @ 10:04AM BY RUSCA.   Report Status 08/01/2012 FINAL   Final  MRSA PCR SCREENING     Status: None   Collection Time    07/30/12  7:22 PM      Result Value Range Status   MRSA by PCR NEGATIVE  NEGATIVE Final   Comment:            The GeneXpert MRSA Assay (FDA     approved for NASAL specimens     only), is one component of a     comprehensive MRSA colonization     surveillance program. It is not     intended to diagnose MRSA     infection nor to guide or     monitor treatment for     MRSA infections.  CULTURE, EXPECTORATED SPUTUM-ASSESSMENT     Status: None   Collection Time    08/01/12  1:01 PM      Result Value Range Status   Specimen Description SPU   Final   Special Requests NONE   Final   Sputum evaluation     Final   Value: MICROSCOPIC FINDINGS SUGGEST THAT THIS SPECIMEN IS NOT REPRESENTATIVE OF LOWER RESPIRATORY SECRETIONS. PLEASE RECOLLECT.     Gram Stain Report Called to,Read Back By and Verified With: WARD, S. AT 15:03PM ON 08/01/12 BY PRUITT, C.   Report Status 08/01/2012 FINAL   Final  BODY FLUID CULTURE     Status: None   Collection Time    08/02/12  9:42 AM      Result Value Range Status   Specimen Description FLUID KNEE JOINT   Final   Special Requests NONE   Final   Gram Stain     Final   Value: WBC PRESENT, PREDOMINANTLY PMN     NO ORGANISMS SEEN   Culture NO GROWTH 3  DAYS   Final   Report Status 08/06/2012 FINAL   Final    Studies/Results: Dg Chest Port 1 View  08/07/2012   *RADIOLOGY REPORT*  Clinical Data: Shortness of breath.  PORTABLE CHEST - 1 VIEW  Comparison: 07/30/2012  Findings: Mild motion degradation.  Exam also degraded by patient body habitus and technique.  The left-sided Port-A-Cath terminates at the high SVC.  Right shoulder arthroplasty. Cardiomegaly accentuated by AP portable technique.  Layering bilateral pleural effusions.  No gross pneumothorax.  There is apparent lucency throughout the soft tissues of the upper chest.  The chin overlies left apex.  Lower lobe predominant interstitial prominence indistinctness.  Bibasilar airspace opacities.  The lung apices are relatively clear.  IMPRESSION: Development of interstitial prominence likely related to congestive heart failure.  Layering bilateral pleural effusions.  Bibasilar airspace disease, likely atelectasis.  Infection or aspiration cannot be excluded.  Lucency throughout the soft tissues of the upper chest.  This could be technique related.  Subcutaneous emphysema due to otherwise occult pneumomediastinum cannot be entirely excluded.  No gross pneumothorax identified.  Consider PA and lateral radiographs if possible.   Original Report Authenticated By: Jeronimo Greaves, M.D.    Medications:  Prior to Admission:  Prescriptions prior to admission  Medication Sig Dispense Refill  . acetaminophen (TYLENOL) 325 MG tablet Take 2 tablets (650  mg total) by mouth every 6 (six) hours as needed for fever.      . ALPRAZolam (XANAX) 0.5 MG tablet Take 0.125-0.5 mg by mouth 4 (four) times daily. Normally takes one-fourth to one tablet twice daily, then takes one to two tablets at bedtime if needed for anxiety/sleep      . calcium citrate (CALCITRATE - DOSED IN MG ELEMENTAL CALCIUM) 950 MG tablet Take 1 tablet by mouth 2 (two) times daily.      . carvedilol (COREG) 25 MG tablet Take 1 tablet (25 mg total) by  mouth 2 (two) times daily with a meal.      . Cefepime-Dextrose 1 GM/50ML SOLR Inject 1 g into the vein 3 (three) times daily. For 7 days      . diltiazem (CARDIZEM SR) 120 MG 12 hr capsule Take 1 capsule (120 mg total) by mouth every 12 (twelve) hours.      Marland Kitchen guaiFENesin (MUCINEX) 600 MG 12 hr tablet Take 2 tablets (1,200 mg total) by mouth 2 (two) times daily.      . Heparin Lock Flush (HEPARIN FLUSH, PORCINE,) 100 UNIT/ML injection 500 Units by Intracatheter route as needed (Use 5cc Itravenously every shift for IV therapy flush with Heparin after flushing with Normal Saline 0.9% to maintain potency of port.).      Marland Kitchen HYDROcodone-acetaminophen (NORCO/VICODIN) 5-325 MG per tablet Take 1 tablet by mouth every 4 (four) hours as needed.  30 tablet  0  . lovastatin (MEVACOR) 10 MG tablet Take 10 mg by mouth daily.       . Multiple Vitamins-Iron (ONE-TABLET-DAILY/IRON PO) Take 1 tablet by mouth daily.      Marland Kitchen omeprazole (PRILOSEC) 20 MG capsule Take 20 mg by mouth daily.      . pegfilgrastim (NEULASTA) 6 MG/0.6ML injection Inject 6 mg into the skin every 14 (fourteen) days. For a total of 4 doses.      Marland Kitchen PROAIR HFA 108 (90 BASE) MCG/ACT inhaler Inhale 1 puff into the lungs every 6 (six) hours as needed. For shortness of breath      . sodium chloride 0.9 % infusion Inject 125 mLs into the vein daily. 125ml/1hr intravenously every shift for dehydration, pneumonia      . Sodium Chloride Flush (NORMAL SALINE FLUSH) 0.9 % SOLN Inject 10 mLs into the vein daily. Use every shift for IV therapy flush port-a-cath before and after IV and ABT infused      . tamsulosin (FLOMAX) 0.4 MG CAPS Take 0.4 mg by mouth daily.      . traMADol (ULTRAM) 50 MG tablet Take 50 mg by mouth at bedtime. For pain      . tuberculin 5 UNIT/0.1ML injection Inject 0.1 mLs into the skin once.      . vancomycin (VANCOCIN) 10 G SOLR Inject 1,250 mg into the vein 2 (two) times daily. Administered in of Normal Saline for 7 days      .  warfarin (COUMADIN) 5 MG tablet Take 5 mg by mouth every evening.      . denosumab (PROLIA) 60 MG/ML SOLN injection Inject 60 mg into the skin every 6 (six) months. Administer in upper arm, thigh, or abdomen      . dextrose 5 % SOLN 50 mL with ceFEPIme 1 G SOLR 1 g Inject 1 g into the vein every 8 (eight) hours.      . filgrastim (NEUPOGEN) 480 MCG/1.6ML injection Inject 1.6 mLs (480 mcg total) into the skin daily at  6 PM.  1.6 mL  0   Scheduled: . aspirin  300 mg Rectal Daily  . ceFEPime (MAXIPIME) IV  1 g Intravenous Q8H  . enalaprilat  0.625 mg Intravenous Q6H  . furosemide  40 mg Intravenous Q12H  . insulin aspart  0-9 Units Subcutaneous Q4H  . Living Better with Heart Failure Book   Does not apply Once  . pantoprazole (PROTONIX) IV  40 mg Intravenous Q24H  . sodium chloride  3 mL Intravenous Q12H  . sodium chloride   Intravenous Q12H  . vancomycin  1,250 mg Intravenous Q12H   Continuous:  ZOX:WRUEAV chloride, acetaminophen, heparin lock flush, ondansetron (ZOFRAN) IV, sodium chloride  Assesment: He has healthcare associated pneumonia. He has what appears to be acute congestive heart failure presumably diastolic since his last echocardiogram showed pretty good left ventricular function. He may have received more than his usual dose of anxiolytic at the nursing home. He has atrial fibrillation with rapid ventricular response. He has a follicular lymphoma. He has been having trouble with his knee and received an injection while he was here last Active Problems:   Atrial fibrillation   Chronic anticoagulation   Follicular lymphoma   HCAP (healthcare-associated pneumonia)   Acute congestive heart failure   Hyperglycemia    Plan: I agree with treatment for heart failure continue with his treatment for healthcare associated pneumonia. Adjustment of his dose of anxiolytic will need to be made before he is discharged. I will go ahead and start him on Cardizem because of his rapid atrial  fib    LOS: 1 day   Sher Shampine L 08/08/2012, 7:34 AM

## 2012-08-08 NOTE — Progress Notes (Signed)
NAME:  Charles [name], Charles [name]             ACCOUNT NO.:  [id number]  MEDICAL RECORD NO.:  [id number]  LOCATION:  [facility]03                          FACILITY:  [facility]  PHYSICIAN:  Niko Penson G. Renard Matter, MD   DATE OF BIRTH:  1941/01/27  DATE OF PROCEDURE: DATE OF DISCHARGE:                                PROGRESS NOTE   This patient was readmitted from the nursing facility through ED with increasing weakness, diaphoresis.  He was felt to be in congestive heart failure with possible aspiration pneumonia, also it was noted he was in atrial fibrillation with rapid ventricular response.  He was admitted to ICU, placed on IV vancomycin and Maxipime.  Also was started on Cardizem because of his atrial fibrillation and  rapid ventricular rate.  OBJECTIVE:  VITAL SIGNS:  Blood pressure 118/56, respiration 27, pulse 85, temp 98.6. GENERAL:  He is at the bedside. HEENT:  Negative. NECK:  Supple. HEART:  Irregular rhythm. LUNGS:  Rhonchus bilateral and lower lung fields. ABDOMEN:  No palpable organs or masses. EXTREMITIES: no edema.  ASSESSMENT:  The patient does have systolic congestive heart failure with low ejection fraction of 25-30% and pro-BNP 14,668.  He does have healthcare-associated pneumonia, lymphoma, prior history of sepsis with chronic knee pain.  He does have anemia with hemoglobin 9.4.  The patient was placed on Cardizem to control his atrial fibrillation.  He is being given Lasix, also was then placed on vancomycin, Maxipime protocol.  Today, we will continue Lasix, continue to monitor.     Dmario Russom G. Renard Matter, MD     AGM/MEDQ  D:  08/08/2012  T:  08/08/2012  Job:  [id number]

## 2012-08-08 NOTE — H&P (Signed)
[facility] History and Physical  Charles [name]  OZH:[id number]  DOB: May 25, 1940   DOA: 08/07/2012   PCP:   [name], MD   Chief Complaint:  Respiratory distress today  HPI: Charles [name] is an 72 y.o. male.  Elderly Caucasian man, being treated for lymphoma, discharged from and the [facility] to [facility] skilled nursing facility 2 days ago to complete treatment of her hospital acquired pneumonia and for rehabilitation. The patient's daughter reports that since discharge she noticed he is developed progressive swelling of his legs, and this afternoon he developed respiratory distress was noted to be saturating in the 70s. In the emergency room patient had a markedly elevated BNP, and chest x-ray was suggestive of heart failure. Patient required BiPAP for respiratory support. Hospitalist service was called to assist.   Interestingly there is a note in patient's history, or CHF with an ejection fraction of about 20% in 2010, but review of the last 3 transthoracic echos in Epic, August 2010,  May 2011,  April 2012, all show ejection fraction greater than 50% with no wall motion abnormality.  His daughter notes that he's been excessively drowsy since being at the nursing home, and she feels it is due to excessive psychotropic medications.   Rewiew of Systems:  Patient is poorly responsive and unable to participate in history; information below is from his daughter  All systems negative except as marked bold or noted in the HPI;  Constitutional:    malaise, fever and chills. ;  Eyes:   eye pain, redness and discharge. ;  ENMT:   ear pain, hoarseness, nasal congestion, sinus pressure and sore throat. ;  Cardiovascular:    chest pain, palpitations, diaphoresis, dyspnea and peripheral edema.  Respiratory:   cough, hemoptysis, wheezing and stridor. ;  Gastrointestinal:  nausea, vomiting, diarrhea, constipation, abdominal pain, melena, blood in stool, hematemesis, jaundice and  rectal bleeding. unusual weight loss..   Genitourinary:    frequency, dysuria, incontinence,flank pain and hematuria; recurrent urinary retention Musculoskeletal:   back pain and neck pain.  swelling and trauma.;  Skin: .  pruritus, rash, abrasions, bruising and skin lesion.; ulcerations Neuro:    headache, lightheadedness and neck stiffness.  weakness, altered level of consciousness, altered mental status, extremity weakness, burning feet, involuntary movement, seizure and syncope.  Psych:    anxiety, depression, insomnia, tearfulness, panic attacks, hallucinations, paranoia, suicidal or homicidal ideation    Past Medical History  Diagnosis Date  . Hypertension   . Atrial fibrillation 10/2008  . Nonischemic cardiomyopathy 02/2009    Presented with CHF; EF of 25-30%; Nonobstructive ASCVD-worst lesion 40% on coronary angiography  . Peptic ulcer disease   . Degenerative joint disease     Knees and shoulders  . Pneumonia 2010  . Low TSH level     Borderline  . Allergic rhinitis   . Anxiety   . Drug abuse   . Tobacco abuse     50 pack years  . 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 Basal cell carcinoma     Past Surgical History  Procedure Laterality Date  . Neck lesion biopsy  June 24, 2010    Follicular lymphoma  . Skin cancer excision      under left breast; variously described as melanoma and basal cell  . Rotator cuff repair      right  . Patella fracture surgery  1975  . Hematoma evacuation      after melanoma removal  . Portacath placement  07/12/2010  .  Colonoscopy  2009    Also underwent an EGD; patient reports no significant findings  . Bone marrow aspiration  06/2012  . Bone marrow biopsy  06/2012  . Fungal infection      Medications:  HOME MEDS: Prior to Admission medications   Medication Sig Start Date End Date Taking? Authorizing Provider  acetaminophen (TYLENOL) 325 MG tablet Take 2 tablets (650 mg total) by mouth every 6 (six) hours as needed for fever. 08/05/12   Yes Fredirick Maudlin, MD  ALPRAZolam Prudy Feeler) 0.5 MG tablet Take 0.125-0.5 mg by mouth 4 (four) times daily. Normally takes one-fourth to one tablet twice daily, then takes one to two tablets at bedtime if needed for anxiety/sleep 08/05/12  Yes Fredirick Maudlin, MD  calcium citrate (CALCITRATE - DOSED IN MG ELEMENTAL CALCIUM) 950 MG tablet Take 1 tablet by mouth 2 (two) times daily.   Yes Historical Provider, MD  carvedilol (COREG) 25 MG tablet Take 1 tablet (25 mg total) by mouth 2 (two) times daily with a meal. 08/05/12  Yes Fredirick Maudlin, MD  Cefepime-Dextrose 1 GM/50ML SOLR Inject 1 g into the vein 3 (three) times daily. For 7 days 08/06/12  Yes Historical Provider, MD  diltiazem (CARDIZEM SR) 120 MG 12 hr capsule Take 1 capsule (120 mg total) by mouth every 12 (twelve) hours. 08/05/12  Yes Fredirick Maudlin, MD  guaiFENesin (MUCINEX) 600 MG 12 hr tablet Take 2 tablets (1,200 mg total) by mouth 2 (two) times daily. 08/05/12  Yes Fredirick Maudlin, MD  Heparin Lock Flush (HEPARIN FLUSH, PORCINE,) 100 UNIT/ML injection 500 Units by Intracatheter route as needed (Use 5cc Itravenously every shift for IV therapy flush with Heparin after flushing with Normal Saline 0.9% to maintain potency of port.).   Yes Historical Provider, MD  HYDROcodone-acetaminophen (NORCO/VICODIN) 5-325 MG per tablet Take 1 tablet by mouth every 4 (four) hours as needed. 08/05/12  Yes Fredirick Maudlin, MD  lovastatin (MEVACOR) 10 MG tablet Take 10 mg by mouth daily.    Yes Historical Provider, MD  Multiple Vitamins-Iron (ONE-TABLET-DAILY/IRON PO) Take 1 tablet by mouth daily.   Yes Historical Provider, MD  omeprazole (PRILOSEC) 20 MG capsule Take 20 mg by mouth daily.   Yes Historical Provider, MD  pegfilgrastim (NEULASTA) 6 MG/0.6ML injection Inject 6 mg into the skin every 14 (fourteen) days. For a total of 4 doses.   Yes Historical Provider, MD  PROAIR HFA 108 (90 BASE) MCG/ACT inhaler Inhale 1 puff into the lungs every 6 (six) hours  as needed. For shortness of breath 01/15/12  Yes Historical Provider, MD  sodium chloride 0.9 % infusion Inject 125 mLs into the vein daily. 138ml/1hr intravenously every shift for dehydration, pneumonia   Yes Historical Provider, MD  Sodium Chloride Flush (NORMAL SALINE FLUSH) 0.9 % SOLN Inject 10 mLs into the vein daily. Use every shift for IV therapy flush port-a-cath before and after IV and ABT infused   Yes Historical Provider, MD  tamsulosin (FLOMAX) 0.4 MG CAPS Take 0.4 mg by mouth daily.   Yes Historical Provider, MD  traMADol (ULTRAM) 50 MG tablet Take 50 mg by mouth at bedtime. For pain 08/05/12  Yes Fredirick Maudlin, MD  tuberculin 5 UNIT/0.1ML injection Inject 0.1 mLs into the skin once.   Yes Historical Provider, MD  vancomycin (VANCOCIN) 10 G SOLR Inject 1,250 mg into the vein 2 (two) times daily. Administered in of Normal Saline for 7 days 08/06/12  Yes Historical Provider, MD  warfarin (COUMADIN) 5 MG tablet Take 5 mg by mouth every evening.   Yes Historical Provider, MD  denosumab (PROLIA) 60 MG/ML SOLN injection Inject 60 mg into the skin every 6 (six) months. Administer in upper arm, thigh, or abdomen    Historical Provider, MD  dextrose 5 % SOLN 50 mL with ceFEPIme 1 G SOLR 1 g Inject 1 g into the vein every 8 (eight) hours. 08/05/12   Fredirick Maudlin, MD  filgrastim (NEUPOGEN) 480 MCG/1.6ML injection Inject 1.6 mLs (480 mcg total) into the skin daily at 6 PM. 08/05/12   Fredirick Maudlin, MD     Allergies:  Allergies  Allergen Reactions  . Statins     Intolerant secondary to severe myalgias    Social History:   reports that he quit smoking about 3 months ago. His smoking use included Cigarettes. He has a 50 pack-year smoking history. He does not have any smokeless tobacco history on file. He reports that he does not drink alcohol or use illicit drugs.  Family History: Family History  Problem Relation Age of Onset  . Heart attack Father   . Coronary artery disease  Other      Physical Exam: Filed Vitals:   08/08/12 0100 08/08/12 0200 08/08/12 0300 08/08/12 0400  BP: 94/57 101/61 117/66 101/63  Pulse: 106 106 107 97  Temp:    98.6 F (37 C)  TempSrc:    Axillary  Resp: 26 27 25 28   Height:      Weight:      SpO2: 100% 100% 100% 100%   Blood pressure 101/63, pulse 97, temperature 98.6 F (37 C), temperature source Axillary, resp. rate 28, height 5\' 7"  (1.702 m), weight 100.7 kg (222 lb 0.1 oz), SpO2 100.00%.  GEN: Sleeping elderly Caucasian gentleman lying bed wearing BiPAP mask acute distress; PSYCH:  Lethargic unable to assess. HEENT: Mucous membranes pink and anicteric; PERRLA; EOM intact; Breasts:: Not examined CHEST WALL: No tenderness CHEST: Tachypneic bibasilar crackles HEART: Irregular irregular rhythm; no murmurs rubs or gallops BACK: Mild kyphosis no scoliosis; no CVA tenderness ABDOMEN: Obese, soft non-tender; bilateral flank dullness, no organomegaly, normal abdominal bowel sounds; no pannus; no intertriginous candida. Rectal Exam: Not done EXTREMITIES:  age-appropriate arthropathy of the hands and knees;3+ edema up to her thighs; no ulcerations. Genitalia: not examined PULSES: 2+ and symmetric SKIN: Normal hydration no rash or ulceration CNS: Cranial nerves 2-12 grossly intact no focal lateralizing neurologic deficit; but unable to fully assess because of patient's lethargy   Labs on Admission:  Basic Metabolic Panel:  Recent Labs Lab 08/01/12 0457 08/02/12 0455 08/03/12 0447 08/04/12 0449 08/07/12 1800  NA 133* 137 136 136 138  K 4.0 3.5 3.5 4.0 3.9  CL 101 106 104 106 108  CO2 22 21 20  18* 22  GLUCOSE 111* 113* 109* 107* 195*  BUN 16 14 12 15 22   CREATININE 0.88 0.75 0.75 0.72 0.79  CALCIUM 7.2* 7.0* 6.9* 7.3* 7.8*  MG 2.1  --   --   --   --    Liver Function Tests:  Recent Labs Lab 08/07/12 1800  AST 27  ALT 20  ALKPHOS 182*  BILITOT 0.2*  PROT 6.5  ALBUMIN 2.5*   No results found for this  basename: LIPASE, AMYLASE,  in the last 168 hours No results found for this basename: AMMONIA,  in the last 168 hours CBC:  Recent Labs Lab 08/01/12 0457 08/02/12 0455 08/05/12 0814 08/07/12 1800  WBC 14.9*  11.3* 41.7* 20.1*  NEUTROABS 13.6* 10.2* 38.5* 19.0*  HGB 8.8* 8.6* 8.4* 9.4*  HCT 26.9* 25.4* 26.2* 29.1*  MCV 84.9 84.7 88.8 87.1  PLT 157 151 253 352   Cardiac Enzymes: No results found for this basename: CKTOTAL, CKMB, CKMBINDEX, TROPONINI,  in the last 168 hours BNP: No components found with this basename: POCBNP,  D-dimer: No components found with this basename: D-DIMER,  CBG:  Recent Labs Lab 08/08/12 0019 08/08/12 0400  GLUCAP 121* 84    Radiological Exams on Admission: Dg Chest Port 1 View  08/07/2012   *RADIOLOGY REPORT*  Clinical Data: Shortness of breath.  PORTABLE CHEST - 1 VIEW  Comparison: 07/30/2012  Findings: Mild motion degradation.  Exam also degraded by patient body habitus and technique.  The left-sided Port-A-Cath terminates at the high SVC.  Right shoulder arthroplasty. Cardiomegaly accentuated by AP portable technique.  Layering bilateral pleural effusions.  No gross pneumothorax.  There is apparent lucency throughout the soft tissues of the upper chest.  The chin overlies left apex.  Lower lobe predominant interstitial prominence indistinctness.  Bibasilar airspace opacities.  The lung apices are relatively clear.  IMPRESSION: Development of interstitial prominence likely related to congestive heart failure.  Layering bilateral pleural effusions.  Bibasilar airspace disease, likely atelectasis.  Infection or aspiration cannot be excluded.  Lucency throughout the soft tissues of the upper chest.  This could be technique related.  Subcutaneous emphysema due to otherwise occult pneumomediastinum cannot be entirely excluded.  No gross pneumothorax identified.  Consider PA and lateral radiographs if possible.   Original Report Authenticated By: Jeronimo Greaves,  M.D.    EKG: Independently reviewed. Atrial fibrillation with a rate of 105; occasional PVCs   Assessment/Plan Present on Admission: Acute on chronic respiratory failure  . Acute congestive heart failure . Atrial fibrillation . HCAP (healthcare-associated pneumonia) . Follicular lymphoma  . Hyperglycemia   PLAN: Will continue this gentleman on BiPAP for respiratory support; We'll treat his respiratory failure is due to the acute heart failure; minimize his IV fluids; diuretics; ACE inhibitors as his blood pressure will allow; discontinue beta blockers for the time being; discontinue calcium channel blockers in favor of ACE inhibitor as for the time being. Serial cardiac enzymes,  His blood pressures are borderline at admission, will use intravenous digoxin if necessary to control his atrial fibrillation rate.  Because of his lethargy his medications will be intravenous, but was expecting that as he improves we'll be able to switch to oral medications and get better control.  We will order a 2-D echo although this will likely not get done until Monday; hopefully he will not need cardiology intervention for Monday otherwise we'll consult [place].  Consult pulmonologist for assistance with management; continue intravenous antibiotics for treatment of healthcare associated pneumonia  Other plans as per orders.  Code Status: Full code; her family are discussing possible changes to his CODE STATUS Family Communication: Discussed with his daughter at bedside Disposition Plan: Continue in intensive care unit for the time; likely discharge back to her skilled nursing  Critical care time: 60 minutes.   Sadat Sliwa Nocturnist [facility] Pager [phone]   08/08/2012, 4:14 AM

## 2012-08-08 NOTE — Progress Notes (Signed)
Dr Renard Matter paged and made aware that patient has had several doses of Lasix and is now starting to have frequent PVC's. Order recevied for BMET. Will continue to monitor.

## 2012-08-08 NOTE — Progress Notes (Signed)
Pt up to chair with assist using walker. Pt able to get up with walker and assist with 2 nurses. Pt tolerated well. Mild shortness of breath noted. Will continue to monitor.

## 2012-08-09 ENCOUNTER — Encounter (HOSPITAL_COMMUNITY): Payer: Self-pay | Admitting: Dietician

## 2012-08-09 ENCOUNTER — Inpatient Hospital Stay (HOSPITAL_COMMUNITY): Payer: Medicare Other

## 2012-08-09 DIAGNOSIS — I059 Rheumatic mitral valve disease, unspecified: Secondary | ICD-10-CM

## 2012-08-09 LAB — BLOOD GAS, ARTERIAL
Acid-Base Excess: 5.8 mmol/L — ABNORMAL HIGH (ref 0.0–2.0)
Drawn by: 27407
FIO2: 0.21 %
O2 Saturation: 91.9 %
Patient temperature: 37
pO2, Arterial: 58.4 mmHg — ABNORMAL LOW (ref 80.0–100.0)

## 2012-08-09 LAB — BASIC METABOLIC PANEL
BUN: 13 mg/dL (ref 6–23)
Calcium: 7.7 mg/dL — ABNORMAL LOW (ref 8.4–10.5)
Chloride: 102 mEq/L (ref 96–112)
Creatinine, Ser: 0.78 mg/dL (ref 0.50–1.35)
GFR calc Af Amer: >90 mL/min (ref >90)
GFR calc non Af Amer: 87 mL/min — ABNORMAL LOW (ref >90)

## 2012-08-09 LAB — CBC
MCH: 27.4 pg (ref 26.0–34.0)
MCHC: 31.8 g/dL (ref 30.0–36.0)
MCV: 86.4 fL (ref 78.0–100.0)
Platelets: 415 10*3/uL — ABNORMAL HIGH (ref 150–400)
RBC: 3.68 MIL/uL — ABNORMAL LOW (ref 4.22–5.81)
RDW: 19.1 % — ABNORMAL HIGH (ref 11.5–15.5)

## 2012-08-09 LAB — CLOSTRIDIUM DIFFICILE BY PCR: Toxigenic C. Difficile by PCR: POSITIVE — AB

## 2012-08-09 LAB — GLUCOSE, CAPILLARY: Glucose-Capillary: 111 mg/dL — ABNORMAL HIGH (ref 70–99)

## 2012-08-09 LAB — URINE CULTURE

## 2012-08-09 LAB — PROTIME-INR: Prothrombin Time: 27.4 seconds — ABNORMAL HIGH (ref 11.6–15.2)

## 2012-08-09 LAB — VANCOMYCIN, TROUGH: Vancomycin Tr: 25.6 ug/mL (ref 10.0–20.0)

## 2012-08-09 MED ORDER — WARFARIN SODIUM 2.5 MG PO TABS
2.5000 mg | ORAL_TABLET | Freq: Once | ORAL | Status: AC
  Administered 2012-08-09: 2.5 mg via ORAL
  Filled 2012-08-09: qty 1

## 2012-08-09 MED ORDER — VANCOMYCIN HCL IN DEXTROSE 1-5 GM/200ML-% IV SOLN
1000.0000 mg | Freq: Two times a day (BID) | INTRAVENOUS | Status: DC
  Administered 2012-08-09 – 2012-08-11 (×5): 1000 mg via INTRAVENOUS
  Filled 2012-08-09 (×6): qty 200

## 2012-08-09 MED ORDER — ALPRAZOLAM 0.25 MG PO TABS
0.2500 mg | ORAL_TABLET | ORAL | Status: DC | PRN
Start: 2012-08-09 — End: 2012-08-12
  Administered 2012-08-09 – 2012-08-12 (×8): 0.25 mg via ORAL
  Filled 2012-08-09 (×8): qty 1

## 2012-08-09 MED ORDER — POTASSIUM CHLORIDE 10 MEQ/100ML IV SOLN
10.0000 meq | INTRAVENOUS | Status: AC
  Administered 2012-08-09 (×3): 10 meq via INTRAVENOUS
  Filled 2012-08-09 (×3): qty 100

## 2012-08-09 MED ORDER — TRAZODONE HCL 50 MG PO TABS
50.0000 mg | ORAL_TABLET | Freq: Every day | ORAL | Status: DC
  Administered 2012-08-09 – 2012-08-12 (×5): 50 mg via ORAL
  Filled 2012-08-09 (×5): qty 1

## 2012-08-09 MED ORDER — GLUCERNA SHAKE PO LIQD
237.0000 mL | Freq: Two times a day (BID) | ORAL | Status: DC
  Administered 2012-08-09 – 2012-08-13 (×6): 237 mL via ORAL

## 2012-08-09 NOTE — Clinical Social Work Note (Signed)
Call from patient's spouse, states that "he will not be going back to [facility]", wants to take patient home and needs info on [facility] resources.  Spoke w RN CM and transmitted request for information from wife as well as patient's stated intention to return home at discharge.  RN CM says she will meet w family to explain resources for [facility] care.  CSW signing off but will stand by to assist if placement is needed; however, at this time patient intends to return home at discharge.  Santa Genera, LCSW Clinical Social Worker [phone])

## 2012-08-09 NOTE — Clinical Social Work Psychosocial (Signed)
    Clinical Social Work Department BRIEF PSYCHOSOCIAL ASSESSMENT 08/09/2012  Patient:  Charles [name], Charles [name]     Account Number:  [id number]     Admit date:  08/07/2012  Clinical Social Worker:  Santa Genera, CLINICAL SOCIAL WORKER  Date/Time:  08/09/2012 10:00 AM  Referred by:  Physician  Date Referred:  08/09/2012 Referred for  SNF Placement   Other Referral:   Interview type:  Patient Other interview type:   Also spoke to [facility] admissions    PSYCHOSOCIAL DATA Living Status:  FACILITY Admitted from facility:  [facility]  Level of care:  Skilled Nursing Facility Primary support name:  Deontae Robson Primary support relationship to patient:  SPOUSE Degree of support available:   Patient depends on wife's support, wants to return home. Unclear whether wife can meet physical care needs.    CURRENT CONCERNS Current Concerns  Post-Acute Placement   Other Concerns:    SOCIAL WORK ASSESSMENT / PLAN CSW met w patient at bedside, patient oriented and significantly sleepy.  Complains of lack of sleep and pain, says he wants pain and anti anxiety medications.  RN notified, was already aware and making plans to address patient concerns appropriately.    Patient discharged from [facility] on 08/05/12 and placed at [facility] for rehab.  Stayed 2 days and returned to [facility] ED 08/07/12 for tx of respiratory failure; patient states worst part of placement at [facility] is "I nearly died there."  Does not want to return to SNF, hopes wife will be able to care for him at home.  Wife has not visited patient, CSW asked that patient call when wife arrives so discharge planning can be initiated.    Spoke w [facility] admissions, says patient is welcome to return at discharge if male bed is available.  However, family has packed up all his belongings at [facility] and facility states that he has been discharged from SNF.  RN CM informed of patient's desire to return home at discharge.    CSW will continue to work w  family   Assessment/plan status:  Psychosocial Support/Ongoing Assessment of Needs Other assessment/ plan:   Information/referral to community resources:   None needed at this time, will continue to monitor what level of care patient will require at discharge.    PATIENT'S/FAMILY'S RESPONSE TO PLAN OF CARE: Patient wants help "getting home."        Santa Genera, LCSW Clinical Social Worker [phone])

## 2012-08-09 NOTE — Progress Notes (Signed)
ANTICOAGULATION CONSULT NOTE   Pharmacy Consult for Warfarin Indication: atrial fibrillation  Allergies  Allergen Reactions  . Statins     Intolerant secondary to severe myalgias   Patient Measurements: Height: 5\' 7"  (170.2 cm) Weight: 199 lb 11.8 oz (90.6 kg) IBW/kg (Calculated) : 66.1  Vital Signs: Temp: 97.9 F (36.6 C) (05/19 0735) Temp src: Oral (05/19 0735) BP: 124/63 mmHg (05/19 0817) Pulse Rate: 98 (05/19 0817)  Labs:  Recent Labs  08/07/12 1800 08/08/12 0550 08/08/12 0757 08/08/12 1130 08/09/12 0438  HGB 9.4*  --   --   --  10.1*  HCT 29.1*  --   --   --  31.8*  PLT 352  --   --   --  415*  LABPROT  --   --  28.2*  --  27.4*  INR  --   --  2.82*  --  2.71*  CREATININE 0.79 0.81  --  0.80 0.78  TROPONINI  --   --  <0.30  --   --    Estimated Creatinine Clearance: 88.3 ml/min (by C-G formula based on Cr of 0.78).  Medical History: Past Medical History  Diagnosis Date  . Hypertension   . Atrial fibrillation 10/2008  . Nonischemic cardiomyopathy 02/2009    Presented with CHF; EF of 25-30%; Nonobstructive ASCVD-worst lesion 40% on coronary angiography  . Peptic ulcer disease   . Degenerative joint disease     Knees and shoulders  . Pneumonia 2010  . Low TSH level     Borderline  . Allergic rhinitis   . Anxiety   . Drug abuse   . Tobacco abuse     50 pack years  . 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 Basal cell carcinoma    Medications:  Scheduled:  . aspirin  300 mg Rectal Daily  . ceFEPime (MAXIPIME) IV  1 g Intravenous Q8H  . enalaprilat  0.625 mg Intravenous Q6H  . feeding supplement  237 mL Oral BID BM  . furosemide  40 mg Intravenous Q12H  . insulin aspart  0-9 Units Subcutaneous Q4H  . Living Better with Heart Failure Book   Does not apply Once  . pantoprazole (PROTONIX) IV  40 mg Intravenous Q24H  . sodium chloride  3 mL Intravenous Q12H  . sodium chloride   Intravenous Q12H  . traZODone  50 mg Oral QHS  . vancomycin  1,000 mg  Intravenous Q12H  . Warfarin - Pharmacist Dosing Inpatient   Does not apply Q24H   Assessment: Continuation of Warfarin from nursing home. PTA indicates Warfarin 5 mg po daily During last admission (May 2014) PTA had Warfarin as 2.5 mg on Sat, Tues, Thurs, Sun, 5 mg other days. During last admission, lower dose needed for therapeutic INR, possibly due to medication interactions. INR therapeutic today Pt is also on aspirin therapy.  Goal of Therapy:  INR 2-3 Monitor platelets by anticoagulation protocol: Yes   Plan:  Coumadin 2.5 mg po today.  INR/PT daily CBC, platelets Labs per protocol  Valrie Hart A 08/09/2012,12:46 PM

## 2012-08-09 NOTE — Progress Notes (Signed)
ANTIBIOTIC CONSULT NOTE   Pharmacy Consult for Vancomycin and Cefepime Indication: pneumonia  Allergies  Allergen Reactions  . Statins     Intolerant secondary to severe myalgias   Patient Measurements: Height: 5\' 7"  (170.2 cm) Weight: 199 lb 11.8 oz (90.6 kg) IBW/kg (Calculated) : 66.1  Vital Signs: Temp: 97.9 F (36.6 C) (05/19 0735) Temp src: Oral (05/19 0735) BP: 124/63 mmHg (05/19 0817) Pulse Rate: 98 (05/19 0817) Intake/Output from previous day: 05/18 0701 - 05/19 0700 In: 1753.9 [P.O.:670; I.V.:333.9; IV Piggyback:750] Out: 9562 [ZHYQM:5784; Stool:3] Intake/Output from this shift: Total I/O In: 250 [P.O.:250] Out: 2101 [Urine:2100; Stool:1]  Labs:  Recent Labs  08/07/12 1800 08/08/12 0550 08/08/12 1130 08/09/12 0438  WBC 20.1*  --   --  9.3  HGB 9.4*  --   --  10.1*  PLT 352  --   --  415*  CREATININE 0.79 0.81 0.80 0.78   Estimated Creatinine Clearance: 88.3 ml/min (by C-G formula based on Cr of 0.78).  Recent Labs  08/09/12 1100  VANCOTROUGH 25.6*    Microbiology: Recent Results (from the past 720 hour(s))  CULTURE, BLOOD (ROUTINE X 2)     Status: None   Collection Time    07/30/12 12:30 PM      Result Value Range Status   Specimen Description BLOOD LEFT ARM   Final   Special Requests BOTTLES DRAWN AEROBIC AND ANAEROBIC  8 CC EACH   Final   Culture  Setup Time 07/31/2012 19:44   Final   Culture     Final   Value: GROUP A STREP (S.PYOGENES) ISOLATED     Note: Gram Stain Report Called to,Read Back By and Verified With: SMITH J @ 0150 07/31/12 BY Cristela Blue J Performed at [facility] CRITICAL RESULT CALLED TO, READ BACK BY AND VERIFIED WITH: SHARON WARD 08/01/12 @ 10:04AM BY RUSCA.   Report Status 08/01/2012 FINAL   Final  CULTURE, BLOOD (ROUTINE X 2)     Status: None   Collection Time    07/30/12 12:45 PM      Result Value Range Status   Specimen Description BLOOD RIGHT ARM   Final   Special Requests BOTTLES DRAWN AEROBIC AND ANAEROBIC  6  CC EACH   Final   Culture  Setup Time 07/31/2012 02:50   Final   Culture     Final   Value: GROUP A STREP (S.PYOGENES) ISOLATED     Note: Gram Stain Report Called to,Read Back By and Verified With: SMITH J @ 0150 07/31/12 BY Cristela Blue J Performed at [facility] CRITICAL RESULT CALLED TO, READ BACK BY AND VERIFIED WITH: SHARON WARD 08/01/12 @ 10:04AM BY RUSCA.   Report Status 08/01/2012 FINAL   Final  MRSA PCR SCREENING     Status: None   Collection Time    07/30/12  7:22 PM      Result Value Range Status   MRSA by PCR NEGATIVE  NEGATIVE Final   Comment:            The GeneXpert MRSA Assay (FDA     approved for NASAL specimens     only), is one component of a     comprehensive MRSA colonization     surveillance program. It is not     intended to diagnose MRSA     infection nor to guide or     monitor treatment for     MRSA infections.  CULTURE, EXPECTORATED SPUTUM-ASSESSMENT     Status:  None   Collection Time    08/01/12  1:01 PM      Result Value Range Status   Specimen Description SPU   Final   Special Requests NONE   Final   Sputum evaluation     Final   Value: MICROSCOPIC FINDINGS SUGGEST THAT THIS SPECIMEN IS NOT REPRESENTATIVE OF LOWER RESPIRATORY SECRETIONS. PLEASE RECOLLECT.     Gram Stain Report Called to,Read Back By and Verified With: WARD, S. AT 15:03PM ON 08/01/12 BY PRUITT, C.   Report Status 08/01/2012 FINAL   Final  BODY FLUID CULTURE     Status: None   Collection Time    08/02/12  9:42 AM      Result Value Range Status   Specimen Description FLUID KNEE JOINT   Final   Special Requests NONE   Final   Gram Stain     Final   Value: WBC PRESENT, PREDOMINANTLY PMN     NO ORGANISMS SEEN   Culture NO GROWTH 3 DAYS   Final   Report Status 08/06/2012 FINAL   Final   Medical History: Past Medical History  Diagnosis Date  . Hypertension   . Atrial fibrillation 10/2008  . Nonischemic cardiomyopathy 02/2009    Presented with CHF; EF of 25-30%; Nonobstructive  ASCVD-worst lesion 40% on coronary angiography  . Peptic ulcer disease   . Degenerative joint disease     Knees and shoulders  . Pneumonia 2010  . Low TSH level     Borderline  . Allergic rhinitis   . Anxiety   . Drug abuse   . Tobacco abuse     50 pack years  . 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 Basal cell carcinoma    Medications:  Scheduled:  . aspirin  300 mg Rectal Daily  . ceFEPime (MAXIPIME) IV  1 g Intravenous Q8H  . enalaprilat  0.625 mg Intravenous Q6H  . feeding supplement  237 mL Oral BID BM  . furosemide  40 mg Intravenous Q12H  . insulin aspart  0-9 Units Subcutaneous Q4H  . Living Better with Heart Failure Book   Does not apply Once  . pantoprazole (PROTONIX) IV  40 mg Intravenous Q24H  . sodium chloride  3 mL Intravenous Q12H  . sodium chloride   Intravenous Q12H  . traZODone  50 mg Oral QHS  . vancomycin  1,000 mg Intravenous Q12H  . Warfarin - Pharmacist Dosing Inpatient   Does not apply Q24H   Assessment: Continuation of antibiotics from nursing home. Pt has good renal function.  Estimated Creatinine Clearance: 88.3 ml/min (by C-G formula based on Cr of 0.78). Trough level is above goal.  Afebrile with normal WBC.  Goal of Therapy:  Vancomycin trough level 15-20 mcg/ml  Plan: Reduce Vancomycin to 1000mg  IV every 12 hours Vancomycin trough level weekly while on Vancomycin Continue Cefepime 1 GM IV every 8 hours Monitor SCr at least twice weekly while on Vancomycin Duration of therapy per MD Labs per protocol  Valrie Hart A 08/09/2012,12:42 PM

## 2012-08-09 NOTE — Progress Notes (Signed)
NAME:  Charles [name], Charles [name]             ACCOUNT NO.:  [id number]  MEDICAL RECORD NO.:  [id number]  LOCATION:  IC03                          FACILITY:  [facility]  PHYSICIAN:  Henley Blyth G. Renard Matter, MD   DATE OF BIRTH:  [birth year]  DATE OF PROCEDURE: DATE OF DISCHARGE:                                PROGRESS NOTE   SUBJECTIVE:  This patient was admitted back to the hospital with increasing weakness and diaphoresis.  He was noted to be in atrial fibrillation with rapid ventricular rate, was placed on Cardizem and also has acute systolic congestive heart failure and pleural effusions with ejection fraction 25-30%.  Pro-BNP [id number].  Questionable aspiration pneumonia, was placed on vancomycin and Maxipen.  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  His pulse rate is more normal now and has started to diurese.  OBJECTIVE:  VITAL SIGNS:  Blood pressure 129/76, respiration 28, pulse 91, temp 98.6.  Blood gases; pH 7.523 with a pCO2 of 35.3, pO2 58.4. HEENT:  Negative. NECK:  Supple.  No JVD or thyroid abnormalities. HEART:  Irregular rhythm. LUNGS:  Occasional rhonchus heard over the lower lung field. ABDOMEN:  No palpable organs or masses. EXTREMITIES:  No edema.  ASSESSMENT:  The patient has had atrial fibrillation with rapid ventricular rate, more normal now.  Does have systolic congestive heart failure with low ejection fraction 25-30%.  Pro-BNP [id number].  Does have healthcare-associated pneumonia, lymphoma, history of sepsis and chronic knee pain.  PLAN:  To continue current regimen; daily Lasix, vancomycin and Maxipen protocol.  The patient being seen by Cardiology as well as Pulmonology.     Shaunette Gassner G. Renard Matter, MD     AGM/MEDQ  D:  08/09/2012  T:  08/09/2012  Job:  [id number]

## 2012-08-09 NOTE — Progress Notes (Signed)
*  PRELIMINARY RESULTS* Echocardiogram 2D Echocardiogram has been performed.  Conrad Bayfield 08/09/2012, 10:00 AM

## 2012-08-09 NOTE — Progress Notes (Signed)
md notified of K+ of 2.8. New orders recieved

## 2012-08-09 NOTE — Progress Notes (Signed)
Nutrition Note  Pt identified with weight loss on [facility] Nutrition Screen.   Wt Readings from Last 10 Encounters:  08/09/12 199 lb 11.8 oz (90.6 kg)  08/03/12 212 lb 4.9 oz (96.3 kg)  07/06/12 199 lb 3.2 oz (90.357 kg)  07/05/12 198 lb (89.812 kg)  05/31/12 192 lb (87.091 kg)  05/18/12 197 lb 8 oz (89.585 kg)  05/08/12 194 lb 3.6 oz (88.1 kg)  04/27/12 198 lb 12.8 oz (90.175 kg)  04/19/12 194 lb 0.6 oz (88.016 kg)  04/12/12 194 lb 12.8 oz (88.361 kg)   Chart reviewed. Pt currently an inpatient at [facility], readmitted on 08/07/12 for CHF. Noted recent admission and discharge for HCAP to [facility] on 08/05/12; pt was discharged to [facility] for PT and IV abx. Pt also followed by [facility] for stage IV follicular non-Hodgkin's lymphoma, s/p chemo.  Since admission, noted significant decline in PO intake as compared to previous admission (50-75% vs 25-35%). Wt hx reveals 13# (6.1%) wt loss x 1 week, which is clinically significant, however, is likely due to fluid loss, as pt with significant edema upon admission and pt has been diuresing.  Past wt hx reveals UBW of approximately 194#. Noted a 12# (5.7%) wt loss x 1 year, 19# (8.7%) wt loss x 6 months, a 5# (2.5%) wt gain x 3 months, and 0 % wt loss x 1 month. These wt changes are not clinically significant, and fluid retention and loss may be a contributing factor to wt inconsistencies.  Pt has been ordered Glucerna BID as an inpatient.  Noted that pt does not desire to go back to [facility]; pt will be reassessed for need for SNF vs. Home health.  Will defer short term nutritional management to inpatient RD. Will continue to monitor after discharge, however, will defer nutritional management to facility RD if pt is not discharged to home.   Melody Haver, RD, LDN Pager: [phone]

## 2012-08-09 NOTE — Progress Notes (Signed)
Notified Dr. Butch Penny and pharmacy of critical vancomycin trough of 25.6.

## 2012-08-09 NOTE — Progress Notes (Signed)
Pt had 4 loose brown stools in <24 hours. Stool sample sent per cdiff protocol

## 2012-08-09 NOTE — Progress Notes (Signed)
UR Chart Review Completed  

## 2012-08-09 NOTE — Progress Notes (Signed)
INITIAL NUTRITION ASSESSMENT  DOCUMENTATION CODES Per approved criteria  -Obesity Unspecified   INTERVENTION: Glucerna Shake po BID, each supplement provides 220 kcal and 10 grams of protein.  NUTRITION DIAGNOSIS: Inadequate oral food intake related to decreased appetite as evidenced by declining PO intake (25-35%).   Goal: Pt will meet >/= 90% of estimated energy needs.   Monitor:  PO intake, labs, weight changes, skin integrity, changes in status  Reason for Assessment: MST=3  72 y.o. male  Admitting Dx: <principal problem not specified>  ASSESSMENT: Noted recent admission and discharge for HCAP to [facility] on 08/05/12; pt was discharged to [facility] for PT and IV abx. Pt was readmitted on 08/07/12 for CHF. Pt also followed by [facility] for stage IV follicular non-Hodgkin's lymphoma, s/p chemo.  Since admission, noted significant decline in PO intake as compared to previous admission (50-75% vs 25-35%). Wt hx reveals 13# (6.1%) wt loss x 1 week, which is clinically significant, however, is likely due to fluid loss, as pt with significant edema upon admission and pt has been diuresing.  Past wt hx reveals UBW of approximately 194#. Noted a 12# (5.7%) wt loss x 1 year, 19# (8.7%) wt loss x 6 months, a 5# (2.5%) wt gain x 3 months, and 0 % wt loss x 1 month. These wt changes are not clinically significant, and fluid retention and loss may be a contributing factor to wt inconsistencies.  Noted that pt does not desire to go back to [facility]; pt will be reassessed for need for SNF vs. Home health.  Pt does not meet criteria for malnutrition at this time, however, pt at risk for malnutrition given cancer dx and decreased PO intake.   Height: Ht Readings from Last 1 Encounters:  08/07/12 5\' 7"  (1.702 m)    Weight: Wt Readings from Last 1 Encounters:  08/09/12 199 lb 11.8 oz (90.6 kg)    Ideal Body Weight: 148#  % Ideal Body Weight: 134%  Wt Readings from Last 10 Encounters:  08/09/12 199 lb  11.8 oz (90.6 kg)  08/03/12 212 lb 4.9 oz (96.3 kg)  07/06/12 199 lb 3.2 oz (90.357 kg)  07/05/12 198 lb (89.812 kg)  05/31/12 192 lb (87.091 kg)  05/18/12 197 lb 8 oz (89.585 kg)  05/08/12 194 lb 3.6 oz (88.1 kg)  04/27/12 198 lb 12.8 oz (90.175 kg)  04/19/12 194 lb 0.6 oz (88.016 kg)  04/12/12 194 lb 12.8 oz (88.361 kg)    Usual Body Weight: 194#  % Usual Body Weight: 103%  BMI:  Body mass index is 31.28 kg/(m^2). Classified as obesity, class I.   Estimated Nutritional Needs: Kcal: [phone] daily Protein: 91-113 grams daily Fluid: 2.3-2.7 L daily  Skin: WDL  Diet Order: Sodium Restricted  EDUCATION NEEDS: -Education not appropriate at this time   Intake/Output Summary (Last 24 hours) at 08/09/12 1223 Last data filed at 08/09/12 1056  Gross per 24 hour  Intake   1535 ml  Output   8354 ml  Net  -6819 ml    Last BM: 08/08/12   Labs:   Recent Labs Lab 08/08/12 0550 08/08/12 1130 08/09/12 0438  NA 141 143 142  K 3.7 3.6 2.8*  CL 109 109 102  CO2 24 25 29   BUN 19 17 13   CREATININE 0.81 0.80 0.78  CALCIUM 7.3* 7.5* 7.7*  MG 2.1  --   --   GLUCOSE 89 112* 103*    CBG (last 3)   Recent Labs  08/09/12 0417  08/09/12 0735 08/09/12 1143  GLUCAP 111* 101* 103*    Scheduled Meds: . aspirin  300 mg Rectal Daily  . ceFEPime (MAXIPIME) IV  1 g Intravenous Q8H  . enalaprilat  0.625 mg Intravenous Q6H  . furosemide  40 mg Intravenous Q12H  . insulin aspart  0-9 Units Subcutaneous Q4H  . Living Better with Heart Failure Book   Does not apply Once  . pantoprazole (PROTONIX) IV  40 mg Intravenous Q24H  . sodium chloride  3 mL Intravenous Q12H  . sodium chloride   Intravenous Q12H  . traZODone  50 mg Oral QHS  . vancomycin  1,250 mg Intravenous Q12H  . Warfarin - Pharmacist Dosing Inpatient   Does not apply Q24H    Continuous Infusions: . diltiazem (CARDIZEM) infusion 15 mg/hr (08/09/12 1122)    Past Medical History  Diagnosis Date  . Hypertension    . Atrial fibrillation 10/2008  . Nonischemic cardiomyopathy 02/2009    Presented with CHF; EF of 25-30%; Nonobstructive ASCVD-worst lesion 40% on coronary angiography  . Peptic ulcer disease   . Degenerative joint disease     Knees and shoulders  . Pneumonia 2010  . Low TSH level     Borderline  . Allergic rhinitis   . Anxiety   . Drug abuse   . Tobacco abuse     50 pack years  . 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 Basal cell carcinoma     Past Surgical History  Procedure Laterality Date  . Neck lesion biopsy  June 24, 2010    Follicular lymphoma  . Skin cancer excision      under left breast; variously described as melanoma and basal cell  . Rotator cuff repair      right  . Patella fracture surgery  1975  . Hematoma evacuation      after melanoma removal  . Portacath placement  07/12/2010  . Colonoscopy  2009    Also underwent an EGD; patient reports no significant findings  . Bone marrow aspiration  06/2012  . Bone marrow biopsy  06/2012  . Fungal infection      Melody Haver, RD, LDN Pager: [phone]

## 2012-08-09 NOTE — Progress Notes (Signed)
Subjective: He feels better. She's less short of breath. He didn't sleep well last night. He is having some loose stools.  Objective: Vital signs in last 24 hours: Temp:  [97.9 F (36.6 C)-99.4 F (37.4 C)] 97.9 F (36.6 C) (05/19 0735) Pulse Rate:  [55-153] 73 (05/19 0700) Resp:  [23-39] 28 (05/19 0700) BP: (103-137)/(44-86) 121/68 mmHg (05/19 0700) SpO2:  [84 %-100 %] 96 % (05/19 0700) Weight:  [90.6 kg (199 lb 11.8 oz)] 90.6 kg (199 lb 11.8 oz) (05/19 0500) Weight change: -10.1 kg (-22 lb 4.3 oz) Last BM Date: 08/08/12  Intake/Output from previous day: 05/18 0701 - 05/19 0700 In: 1753.9 [P.O.:670; I.V.:333.9; IV Piggyback:750] Out: 4098 [JXBJY:7829; Stool:3]  PHYSICAL EXAM General appearance: alert, cooperative and mild distress Resp: rhonchi bilaterally Cardio: irregularly irregular rhythm GI: soft, non-tender; bowel sounds normal; no masses,  no organomegaly Extremities: His edema has improved but not resolved  Lab Results:    Basic Metabolic Panel:  Recent Labs  56/21/30 0550 08/08/12 1130 08/09/12 0438  NA 141 143 142  K 3.7 3.6 2.8*  CL 109 109 102  CO2 24 25 29   GLUCOSE 89 112* 103*  BUN 19 17 13   CREATININE 0.81 0.80 0.78  CALCIUM 7.3* 7.5* 7.7*  MG 2.1  --   --    Liver Function Tests:  Recent Labs  08/07/12 1800  AST 27  ALT 20  ALKPHOS 182*  BILITOT 0.2*  PROT 6.5  ALBUMIN 2.5*   No results found for this basename: LIPASE, AMYLASE,  in the last 72 hours No results found for this basename: AMMONIA,  in the last 72 hours CBC:  Recent Labs  08/07/12 1800 08/09/12 0438  WBC 20.1* 9.3  NEUTROABS 19.0*  --   HGB 9.4* 10.1*  HCT 29.1* 31.8*  MCV 87.1 86.4  PLT 352 415*   Cardiac Enzymes:  Recent Labs  08/08/12 0757  TROPONINI <0.30   BNP:  Recent Labs  08/07/12 1800  PROBNP 14668.0*   D-Dimer: No results found for this basename: DDIMER,  in the last 72 hours CBG:  Recent Labs  08/08/12 1107 08/08/12 1552  08/08/12 2128 08/08/12 2351 08/09/12 0417 08/09/12 0735  GLUCAP 103* 117* 125* 101* 111* 101*   Hemoglobin A1C:  Recent Labs  08/07/12 1800  HGBA1C 5.5   Fasting Lipid Panel: No results found for this basename: CHOL, HDL, LDLCALC, TRIG, CHOLHDL, LDLDIRECT,  in the last 72 hours Thyroid Function Tests: No results found for this basename: TSH, T4TOTAL, FREET4, T3FREE, THYROIDAB,  in the last 72 hours Anemia Panel: No results found for this basename: VITAMINB12, FOLATE, FERRITIN, TIBC, IRON, RETICCTPCT,  in the last 72 hours Coagulation:  Recent Labs  08/08/12 0757 08/09/12 0438  LABPROT 28.2* 27.4*  INR 2.82* 2.71*   Urine Drug Screen: Drugs of Abuse  No results found for this basename: labopia, cocainscrnur, labbenz, amphetmu, thcu, labbarb    Alcohol Level: No results found for this basename: ETH,  in the last 72 hours Urinalysis:  Recent Labs  08/07/12 1709  COLORURINE YELLOW  LABSPEC >1.030*  PHURINE 6.0  GLUCOSEU NEGATIVE  HGBUR NEGATIVE  BILIRUBINUR NEGATIVE  KETONESUR NEGATIVE  PROTEINUR 100*  UROBILINOGEN 0.2  NITRITE NEGATIVE  LEUKOCYTESUR NEGATIVE   Misc. Labs:  ABGS  Recent Labs  08/09/12 0545  PHART 7.523*  PO2ART 58.4*  TCO2 26.4  HCO3 28.9*   CULTURES Recent Results (from the past 240 hour(s))  CULTURE, BLOOD (ROUTINE X 2)  Status: None   Collection Time    07/30/12 12:30 PM      Result Value Range Status   Specimen Description BLOOD LEFT ARM   Final   Special Requests BOTTLES DRAWN AEROBIC AND ANAEROBIC  8 CC EACH   Final   Culture  Setup Time 07/31/2012 19:44   Final   Culture     Final   Value: GROUP A STREP (S.PYOGENES) ISOLATED     Note: Gram Stain Report Called to,Read Back By and Verified With: SMITH J @ 0150 07/31/12 BY Cristela Blue J Performed at [facility] CRITICAL RESULT CALLED TO, READ BACK BY AND VERIFIED WITH: SHARON WARD 08/01/12 @ 10:04AM BY RUSCA.   Report Status 08/01/2012 FINAL   Final  CULTURE, BLOOD  (ROUTINE X 2)     Status: None   Collection Time    07/30/12 12:45 PM      Result Value Range Status   Specimen Description BLOOD RIGHT ARM   Final   Special Requests BOTTLES DRAWN AEROBIC AND ANAEROBIC  6 CC EACH   Final   Culture  Setup Time 07/31/2012 02:50   Final   Culture     Final   Value: GROUP A STREP (S.PYOGENES) ISOLATED     Note: Gram Stain Report Called to,Read Back By and Verified With: SMITH J @ 0150 07/31/12 BY Cristela Blue J Performed at [facility] CRITICAL RESULT CALLED TO, READ BACK BY AND VERIFIED WITH: SHARON WARD 08/01/12 @ 10:04AM BY RUSCA.   Report Status 08/01/2012 FINAL   Final  MRSA PCR SCREENING     Status: None   Collection Time    07/30/12  7:22 PM      Result Value Range Status   MRSA by PCR NEGATIVE  NEGATIVE Final   Comment:            The GeneXpert MRSA Assay (FDA     approved for NASAL specimens     only), is one component of a     comprehensive MRSA colonization     surveillance program. It is not     intended to diagnose MRSA     infection nor to guide or     monitor treatment for     MRSA infections.  CULTURE, EXPECTORATED SPUTUM-ASSESSMENT     Status: None   Collection Time    08/01/12  1:01 PM      Result Value Range Status   Specimen Description SPU   Final   Special Requests NONE   Final   Sputum evaluation     Final   Value: MICROSCOPIC FINDINGS SUGGEST THAT THIS SPECIMEN IS NOT REPRESENTATIVE OF LOWER RESPIRATORY SECRETIONS. PLEASE RECOLLECT.     Gram Stain Report Called to,Read Back By and Verified With: WARD, S. AT 15:03PM ON 08/01/12 BY PRUITT, C.   Report Status 08/01/2012 FINAL   Final  BODY FLUID CULTURE     Status: None   Collection Time    08/02/12  9:42 AM      Result Value Range Status   Specimen Description FLUID KNEE JOINT   Final   Special Requests NONE   Final   Gram Stain     Final   Value: WBC PRESENT, PREDOMINANTLY PMN     NO ORGANISMS SEEN   Culture NO GROWTH 3 DAYS   Final   Report Status 08/06/2012 FINAL    Final   Studies/Results: Dg Chest Port 1 View  08/09/2012   *RADIOLOGY REPORT*  Clinical Data: Pneumonia.  PORTABLE CHEST - 1 VIEW  Comparison: 08/07/2012.  Findings: The heart is enlarged but stable.  There are persistent bilateral lower lobe predominant airspace opacities, most likely pneumonia.  Slight improved aeration on the left.  Bilateral pleural effusions are suspected.  IMPRESSION: Persistent bilateral airspace process with slight improved aeration on the left.   Original Report Authenticated By: Rudie Meyer, M.D.   Dg Chest Port 1 View  08/07/2012   *RADIOLOGY REPORT*  Clinical Data: Shortness of breath.  PORTABLE CHEST - 1 VIEW  Comparison: 07/30/2012  Findings: Mild motion degradation.  Exam also degraded by patient body habitus and technique.  The left-sided Port-A-Cath terminates at the high SVC.  Right shoulder arthroplasty. Cardiomegaly accentuated by AP portable technique.  Layering bilateral pleural effusions.  No gross pneumothorax.  There is apparent lucency throughout the soft tissues of the upper chest.  The chin overlies left apex.  Lower lobe predominant interstitial prominence indistinctness.  Bibasilar airspace opacities.  The lung apices are relatively clear.  IMPRESSION: Development of interstitial prominence likely related to congestive heart failure.  Layering bilateral pleural effusions.  Bibasilar airspace disease, likely atelectasis.  Infection or aspiration cannot be excluded.  Lucency throughout the soft tissues of the upper chest.  This could be technique related.  Subcutaneous emphysema due to otherwise occult pneumomediastinum cannot be entirely excluded.  No gross pneumothorax identified.  Consider PA and lateral radiographs if possible.   Original Report Authenticated By: Jeronimo Greaves, M.D.    Medications:  Scheduled: . aspirin  300 mg Rectal Daily  . ceFEPime (MAXIPIME) IV  1 g Intravenous Q8H  . enalaprilat  0.625 mg Intravenous Q6H  . furosemide  40 mg  Intravenous Q12H  . insulin aspart  0-9 Units Subcutaneous Q4H  . Living Better with Heart Failure Book   Does not apply Once  . pantoprazole (PROTONIX) IV  40 mg Intravenous Q24H  . potassium chloride  10 mEq Intravenous Q1 Hr x 3  . sodium chloride  3 mL Intravenous Q12H  . sodium chloride   Intravenous Q12H  . traZODone  50 mg Oral QHS  . vancomycin  1,250 mg Intravenous Q12H  . Warfarin - Pharmacist Dosing Inpatient   Does not apply Q24H   Continuous: . diltiazem (CARDIZEM) infusion 15 mg/hr (08/09/12 0700)   ZOX:WRUEAV chloride, acetaminophen, acetaminophen, heparin lock flush, ondansetron (ZOFRAN) IV, sodium chloride  Assesment: He has what appears to be acute congestive heart failure. His last echocardiogram showed pretty good left ventricular function so this is presumably diastolic. It may also be related to the fact that he had atrial fibrillation with rapid ventricular response. He has healthcare associated pneumonia. He is now having diarrhea. He has a follicular lymphoma. Potassium is low and is being replaced Active Problems:   Atrial fibrillation   Chronic anticoagulation   Follicular lymphoma   HCAP (healthcare-associated pneumonia)   Acute congestive heart failure   Hyperglycemia    Plan: Continue treatment for the pneumonia he will have cardiology consult .    LOS: 2 days   Charles [name] L 08/09/2012, 8:03 AM

## 2012-08-10 DIAGNOSIS — Z7901 Long term (current) use of anticoagulants: Secondary | ICD-10-CM

## 2012-08-10 LAB — CBC
HCT: 33.2 % — ABNORMAL LOW (ref 39.0–52.0)
Hemoglobin: 10.7 g/dL — ABNORMAL LOW (ref 13.0–17.0)
MCH: 27.8 pg (ref 26.0–34.0)
MCHC: 32.2 g/dL (ref 30.0–36.0)
MCV: 86.2 fL (ref 78.0–100.0)
Platelets: 375 10*3/uL (ref 150–400)
RBC: 3.85 MIL/uL — ABNORMAL LOW (ref 4.22–5.81)
RDW: 19 % — ABNORMAL HIGH (ref 11.5–15.5)
WBC: 8.2 10*3/uL (ref 4.0–10.5)

## 2012-08-10 LAB — BASIC METABOLIC PANEL
BUN: 9 mg/dL (ref 6–23)
CO2: 31 mEq/L (ref 19–32)
Calcium: 7.6 mg/dL — ABNORMAL LOW (ref 8.4–10.5)
Chloride: 102 mEq/L (ref 96–112)
Creatinine, Ser: 0.73 mg/dL (ref 0.50–1.35)
GFR calc Af Amer: >90 mL/min (ref >90)
GFR calc non Af Amer: 90 mL/min — ABNORMAL LOW (ref >90)
Glucose, Bld: 105 mg/dL — ABNORMAL HIGH (ref 70–99)
Potassium: 3 mEq/L — ABNORMAL LOW (ref 3.5–5.1)
Sodium: 144 mEq/L (ref 135–145)

## 2012-08-10 LAB — GLUCOSE, CAPILLARY
Glucose-Capillary: 102 mg/dL — ABNORMAL HIGH (ref 70–99)
Glucose-Capillary: 101 mg/dL — ABNORMAL HIGH (ref 70–99)
Glucose-Capillary: 123 mg/dL — ABNORMAL HIGH (ref 70–99)
Glucose-Capillary: 126 mg/dL — ABNORMAL HIGH (ref 70–99)

## 2012-08-10 LAB — PROTIME-INR
INR: 2.67 — ABNORMAL HIGH (ref 0.00–1.49)
Prothrombin Time: 27.1 seconds — ABNORMAL HIGH (ref 11.6–15.2)

## 2012-08-10 MED ORDER — POTASSIUM CHLORIDE 10 MEQ/100ML IV SOLN
10.0000 meq | INTRAVENOUS | Status: AC
  Administered 2012-08-10 (×3): 10 meq via INTRAVENOUS
  Filled 2012-08-10: qty 300

## 2012-08-10 MED ORDER — FUROSEMIDE 20 MG PO TABS
60.0000 mg | ORAL_TABLET | Freq: Every day | ORAL | Status: DC
  Administered 2012-08-11 – 2012-08-13 (×3): 60 mg via ORAL
  Filled 2012-08-10 (×3): qty 1

## 2012-08-10 MED ORDER — VANCOMYCIN 50 MG/ML ORAL SOLUTION
250.0000 mg | Freq: Four times a day (QID) | ORAL | Status: DC
  Administered 2012-08-10 – 2012-08-13 (×12): 250 mg via ORAL
  Filled 2012-08-10 (×25): qty 5

## 2012-08-10 MED ORDER — WARFARIN SODIUM 2.5 MG PO TABS
2.5000 mg | ORAL_TABLET | Freq: Once | ORAL | Status: AC
  Administered 2012-08-10: 2.5 mg via ORAL
  Filled 2012-08-10: qty 1

## 2012-08-10 MED ORDER — POTASSIUM CHLORIDE CRYS ER 20 MEQ PO TBCR
40.0000 meq | EXTENDED_RELEASE_TABLET | ORAL | Status: AC
  Administered 2012-08-10 (×2): 40 meq via ORAL
  Filled 2012-08-10 (×3): qty 2

## 2012-08-10 NOTE — Progress Notes (Signed)
Subjective: He says he feels better. He has no new complaints. His breathing is better. He has less edema. He has been having diarrhea and is positive for C. Difficile. His left ventricular ejection fraction is still greater than 50%  Objective: Vital signs in last 24 hours: Temp:  [98.1 F (36.7 C)-99.4 F (37.4 C)] 99.1 F (37.3 C) (05/20 0800) Pulse Rate:  [52-99] 77 (05/20 0800) Resp:  [19-30] 23 (05/20 0800) BP: (105-128)/(63-88) 108/67 mmHg (05/20 0800) SpO2:  [95 %-100 %] 100 % (05/19 2100) Weight:  [89.2 kg (196 lb 10.4 oz)] 89.2 kg (196 lb 10.4 oz) (05/20 0442) Weight change: -1.4 kg (-3 lb 1.4 oz) Last BM Date: 08/09/12  Intake/Output from previous day: 05/19 0701 - 05/20 0700 In: 956 [P.O.:250; I.V.:356; IV Piggyback:350] Out: 8052 [Urine:8050; Stool:2]  PHYSICAL EXAM General appearance: alert, cooperative and mild distress Resp: rhonchi bilaterally Cardio: irregularly irregular rhythm GI: soft, non-tender; bowel sounds normal; no masses,  no organomegaly Extremities: venous stasis dermatitis noted and Less edema  Lab Results:    Basic Metabolic Panel:  Recent Labs  16/10/96 0550  08/09/12 0438 08/10/12 0439  NA 141  < > 142 144  K 3.7  < > 2.8* 3.0*  CL 109  < > 102 102  CO2 24  < > 29 31  GLUCOSE 89  < > 103* 105*  BUN 19  < > 13 9  CREATININE 0.81  < > 0.78 0.73  CALCIUM 7.3*  < > 7.7* 7.6*  MG 2.1  --   --   --   < > = values in this interval not displayed. Liver Function Tests:  Recent Labs  08/07/12 1800  AST 27  ALT 20  ALKPHOS 182*  BILITOT 0.2*  PROT 6.5  ALBUMIN 2.5*   No results found for this basename: LIPASE, AMYLASE,  in the last 72 hours No results found for this basename: AMMONIA,  in the last 72 hours CBC:  Recent Labs  08/07/12 1800 08/09/12 0438 08/10/12 0439  WBC 20.1* 9.3 8.2  NEUTROABS 19.0*  --   --   HGB 9.4* 10.1* 10.7*  HCT 29.1* 31.8* 33.2*  MCV 87.1 86.4 86.2  PLT 352 415* 375   Cardiac  Enzymes:  Recent Labs  08/08/12 0757  TROPONINI <0.30   BNP:  Recent Labs  08/07/12 1800  PROBNP 14668.0*   D-Dimer: No results found for this basename: DDIMER,  in the last 72 hours CBG:  Recent Labs  08/09/12 1143 08/09/12 1715 08/09/12 1951 08/09/12 2346 08/10/12 0358 08/10/12 0736  GLUCAP 103* 122* 101* 118* 102* 101*   Hemoglobin A1C:  Recent Labs  08/07/12 1800  HGBA1C 5.5   Fasting Lipid Panel: No results found for this basename: CHOL, HDL, LDLCALC, TRIG, CHOLHDL, LDLDIRECT,  in the last 72 hours Thyroid Function Tests: No results found for this basename: TSH, T4TOTAL, FREET4, T3FREE, THYROIDAB,  in the last 72 hours Anemia Panel: No results found for this basename: VITAMINB12, FOLATE, FERRITIN, TIBC, IRON, RETICCTPCT,  in the last 72 hours Coagulation:  Recent Labs  08/09/12 0438 08/10/12 0439  LABPROT 27.4* 27.1*  INR 2.71* 2.67*   Urine Drug Screen: Drugs of Abuse  No results found for this basename: labopia, cocainscrnur, labbenz, amphetmu, thcu, labbarb    Alcohol Level: No results found for this basename: ETH,  in the last 72 hours Urinalysis:  Recent Labs  08/07/12 1709  COLORURINE YELLOW  LABSPEC >1.030*  PHURINE 6.0  GLUCOSEU NEGATIVE  HGBUR NEGATIVE  BILIRUBINUR NEGATIVE  KETONESUR NEGATIVE  PROTEINUR 100*  UROBILINOGEN 0.2  NITRITE NEGATIVE  LEUKOCYTESUR NEGATIVE   Misc. Labs:  ABGS  Recent Labs  08/09/12 0545  PHART 7.523*  PO2ART 58.4*  TCO2 26.4  HCO3 28.9*   CULTURES Recent Results (from the past 240 hour(s))  CULTURE, EXPECTORATED SPUTUM-ASSESSMENT     Status: None   Collection Time    08/01/12  1:01 PM      Result Value Range Status   Specimen Description SPU   Final   Special Requests NONE   Final   Sputum evaluation     Final   Value: MICROSCOPIC FINDINGS SUGGEST THAT THIS SPECIMEN IS NOT REPRESENTATIVE OF LOWER RESPIRATORY SECRETIONS. PLEASE RECOLLECT.     Gram Stain Report Called to,Read Back By  and Verified With: WARD, S. AT 15:03PM ON 08/01/12 BY PRUITT, C.   Report Status 08/01/2012 FINAL   Final  BODY FLUID CULTURE     Status: None   Collection Time    08/02/12  9:42 AM      Result Value Range Status   Specimen Description FLUID KNEE JOINT   Final   Special Requests NONE   Final   Gram Stain     Final   Value: WBC PRESENT, PREDOMINANTLY PMN     NO ORGANISMS SEEN   Culture NO GROWTH 3 DAYS   Final   Report Status 08/06/2012 FINAL   Final  URINE CULTURE     Status: None   Collection Time    08/07/12  5:09 PM      Result Value Range Status   Specimen Description URINE, CLEAN CATCH   Final   Special Requests NONE   Final   Culture  Setup Time 08/08/2012 19:26   Final   Colony Count NO GROWTH   Final   Culture NO GROWTH   Final   Report Status 08/09/2012 FINAL   Final  CLOSTRIDIUM DIFFICILE BY PCR     Status: Abnormal   Collection Time    08/09/12  6:13 AM      Result Value Range Status   C difficile by pcr POSITIVE (*) NEGATIVE Final   Comment: CRITICAL RESULT CALLED TO, READ BACK BY AND VERIFIED WITH:     WARD,S. AT 1242 ON 08/09/2012 BY BAUGHAM,M.   Studies/Results: Dg Chest Port 1 View  08/09/2012   *RADIOLOGY REPORT*  Clinical Data: Pneumonia.  PORTABLE CHEST - 1 VIEW  Comparison: 08/07/2012.  Findings: The heart is enlarged but stable.  There are persistent bilateral lower lobe predominant airspace opacities, most likely pneumonia.  Slight improved aeration on the left.  Bilateral pleural effusions are suspected.  IMPRESSION: Persistent bilateral airspace process with slight improved aeration on the left.   Original Report Authenticated By: Rudie Meyer, M.D.    Medications:  Scheduled: . aspirin  300 mg Rectal Daily  . ceFEPime (MAXIPIME) IV  1 g Intravenous Q8H  . enalaprilat  0.625 mg Intravenous Q6H  . feeding supplement  237 mL Oral BID BM  . furosemide  40 mg Intravenous Q12H  . insulin aspart  0-9 Units Subcutaneous Q4H  . Living Better with Heart  Failure Book   Does not apply Once  . pantoprazole (PROTONIX) IV  40 mg Intravenous Q24H  . potassium chloride  10 mEq Intravenous Q1 Hr x 3  . sodium chloride  3 mL Intravenous Q12H  . sodium chloride   Intravenous Q12H  . traZODone  50 mg  Oral QHS  . vancomycin  250 mg Oral Q6H  . vancomycin  1,000 mg Intravenous Q12H  . Warfarin - Pharmacist Dosing Inpatient   Does not apply Q24H   Continuous: . diltiazem (CARDIZEM) infusion 20 mg/hr (08/10/12 0417)   ZOX:WRUEAV chloride, acetaminophen, acetaminophen, ALPRAZolam, heparin lock flush, ondansetron (ZOFRAN) IV, sodium chloride  Assesment: He has healthcare associated pneumonia. That seems to be improving. He had atrial fibrillation with rapid ventricular response. He had congestive heart failure by physical examination and by very elevated BNP but systolic function at least is normal. He has C. difficile colitis considering the elevated white blood cell count etc. he's going to need to be treated with oral vancomycin rather than with Flagyl I think Active Problems:   Atrial fibrillation   Chronic anticoagulation   Follicular lymphoma   HCAP (healthcare-associated pneumonia)   Atrial fibrillation with rapid ventricular response   Acute congestive heart failure   Hyperglycemia    Plan: Add vancomycin. Continue diuresis. His potassium was low and is being replaced    LOS: 3 days   Karita Dralle L 08/10/2012, 8:12 AM

## 2012-08-10 NOTE — Progress Notes (Signed)
ANTICOAGULATION CONSULT NOTE   Pharmacy Consult for Warfarin Indication: atrial fibrillation  Allergies  Allergen Reactions  . Statins     Intolerant secondary to severe myalgias   Patient Measurements: Height: 5\' 7"  (170.2 cm) Weight: 196 lb 10.4 oz (89.2 kg) IBW/kg (Calculated) : 66.1  Vital Signs: Temp: 99.1 F (37.3 C) (05/20 0800) Temp src: Oral (05/20 0442) BP: 108/67 mmHg (05/20 0800) Pulse Rate: 77 (05/20 0800)  Labs:  Recent Labs  08/07/12 1800  08/08/12 0757 08/08/12 1130 08/09/12 0438 08/10/12 0439  HGB 9.4*  --   --   --  10.1* 10.7*  HCT 29.1*  --   --   --  31.8* 33.2*  PLT 352  --   --   --  415* 375  LABPROT  --   --  28.2*  --  27.4* 27.1*  INR  --   --  2.82*  --  2.71* 2.67*  CREATININE 0.79  < >  --  0.80 0.78 0.73  TROPONINI  --   --  <0.30  --   --   --   < > = values in this interval not displayed. Estimated Creatinine Clearance: 87.6 ml/min (by C-G formula based on Cr of 0.73).  Medical History: Past Medical History  Diagnosis Date  . Hypertension   . Atrial fibrillation 10/2008  . Nonischemic cardiomyopathy 02/2009    Presented with CHF; EF of 25-30%; Nonobstructive ASCVD-worst lesion 40% on coronary angiography  . Peptic ulcer disease   . Degenerative joint disease     Knees and shoulders  . Pneumonia 2010  . Low TSH level     Borderline  . Allergic rhinitis   . Anxiety   . Drug abuse   . Tobacco abuse     50 pack years  . 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 Basal cell carcinoma    Medications:  Scheduled:  . aspirin  300 mg Rectal Daily  . ceFEPime (MAXIPIME) IV  1 g Intravenous Q8H  . enalaprilat  0.625 mg Intravenous Q6H  . feeding supplement  237 mL Oral BID BM  . furosemide  40 mg Intravenous Q12H  . insulin aspart  0-9 Units Subcutaneous Q4H  . Living Better with Heart Failure Book   Does not apply Once  . pantoprazole (PROTONIX) IV  40 mg Intravenous Q24H  . potassium chloride  10 mEq Intravenous Q1 Hr x 3  . sodium  chloride  3 mL Intravenous Q12H  . sodium chloride   Intravenous Q12H  . traZODone  50 mg Oral QHS  . vancomycin  250 mg Oral Q6H  . vancomycin  1,000 mg Intravenous Q12H  . Warfarin - Pharmacist Dosing Inpatient   Does not apply Q24H   Assessment: Continuation of Warfarin from nursing home. PTA indicates Warfarin 5 mg po daily During last admission (May 2014) PTA had Warfarin as 2.5 mg on Sat, Tues, Thurs, Sun, 5 mg other days. During last admission, lower dose needed for therapeutic INR, possibly due to medication interactions. INR remains therapeutic and appears relatively stable. Pt is also on aspirin therapy.  Goal of Therapy:  INR 2-3 Monitor platelets by anticoagulation protocol: Yes   Plan:  Coumadin 2.5 mg po today x 1 INR/PT daily CBC, platelets Labs per protocol  Valrie Hart A 08/10/2012,8:50 AM

## 2012-08-10 NOTE — Care Management Note (Signed)
    Page 1 of 2   08/13/2012     5:08:42 PM   CARE MANAGEMENT NOTE 08/13/2012  Patient:  Charles [name], Charles [name]   Account Number:  [id number]   Date Initiated:  08/09/2012  Documentation initiated by:  Anibal Henderson  Subjective/Objective Assessment:   Admitted from [facility], where he had only been for 2 days. Returns with CHF and PNA. Wife states he never started on Rehab, but became fluid overloaded and had to return to the hospital. Pt and spouse, and daughter, who is visiting have     Action/Plan:   decided to take pt home, and not return to SNF at D/C. He will need a hospital bed, oxygen,  and HH to follow.   Anticipated DC Date:  08/13/2012   Anticipated DC Plan:  HOME W HOME HEALTH SERVICES  In-house referral  Clinical Social Worker      DC Planning Services  CM consult      [facility] Choice  HOME HEALTH  DURABLE MEDICAL EQUIPMENT   Choice offered to / List presented to:  C-3 Spouse   DME arranged  HOSPITAL BED      DME agency  [facility]     [facility] arranged  HH-1 RN  HH-10 DISEASE MANAGEMENT  HH-2 PT  HH-4 NURSE'S AIDE  HH-6 SOCIAL WORKER      HH agency  [facility]   Status of service:  Completed, signed off Medicare Important Message given?  YES (If response is "NO", the following Medicare IM given date fields will be blank) Date Medicare IM given:  08/11/2012 Date Additional Medicare IM given:  08/13/2012  Discharge Disposition:  HOME W HOME HEALTH SERVICES  Per UR Regulation:  Reviewed for med. necessity/level of care/duration of stay  If discussed at Long Length of Stay Meetings, dates discussed:   08/12/2012    Comments:  08/09/12 1500 Anibal Henderson RN/CM

## 2012-08-10 NOTE — Consult Note (Signed)
CARDIOLOGY CONSULT NOTE  Patient ID: ANJELO PULLMAN MRN: [id number] DOB/AGE: [birth year] 72 y.o.  Admit date: 08/07/2012 Referring Physician: Kari Baars MD Primary Fransisca Connors, MD Primary Cardiologist: [place] Bing MD Reason for Consultation: CHF, Chronic Atrial fibrillation  Active Problems:   Atrial fibrillation   Chronic anticoagulation   Follicular lymphoma   HCAP (healthcare-associated pneumonia)   Atrial fibrillation with rapid ventricular response   Acute congestive heart failure   Hyperglycemia  HPI: Mr. Keeter is a 72 year old patient admitted with congestive heart failure. The patient was recently admitted to [facility] approximately 2 weeks ago for treatment of hospital-acquired pneumonia, recovered and was sent to [facility] for continued treatment and PT 2 days ago. He experienced dyspnea and lower extremity edema within 24 hours of transfer and was readmitted on 08/07/2012 from the ER. BiPAP was used for respiratory support.  On admission the patient's pro BNP was 14,660. Weight on admission 222 pounds. Chest x-ray demonstrated CHF without pulmonary edema, layering bilateral pleural effusions. He is being treated with IV Lasix 40 mg every 12 hours and has diuresis to a  weight 196 pounds. Echocardiogram completed on 08/09/2012 demonstrated EF of 50-55% with mild to moderate mitral regurgitation. Left atrium was severely dilated, right atrium was mildly to moderately dilated. PA pressure 44 mmHg. Compared to an echocardiogram completed in April 2012, magnitude of mitral regurgitation has increased.  Other history includes, hypertension, atrial fibrillation on chronic coumadin therapy, follicular lymphoma and basal cell carcinoma. Pt is stuporous at this time, difficult to arouse but will open his eyes. History is obtained from current and PMH,  Review of systems complete and found to be negative unless listed above   Past  Medical History  Diagnosis Date  . Hypertension   . Atrial fibrillation 10/2008  . Nonischemic cardiomyopathy 02/2009    Presented with CHF; EF of 25-30%; Nonobstructive ASCVD-worst lesion 40% on coronary angiography  . Peptic ulcer disease   . Degenerative joint disease     Knees and shoulders  . Pneumonia 2010  . Low TSH level     Borderline  . Allergic rhinitis   . Anxiety   . Drug abuse   . Tobacco abuse     50 pack years  . 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 Basal cell carcinoma     Family History  Problem Relation Age of Onset  . Heart attack Father   . Coronary artery disease Other     History   Social History  . Marital Status: Married    Spouse Name: N/A    Number of Children: N/A  . Years of Education: N/A   Occupational History  . Full time    Social History Main Topics  . Smoking status: Former Smoker -- 1.00 packs/day for 50 years    Types: Cigarettes    Quit date: 04/12/2012  . Smokeless tobacco: Not on file  . Alcohol Use: No  . Drug Use: No  . Sexually Active: Not on file   Other Topics Concern  . Not on file   Social History Narrative   Married   No regular exercise    Past Surgical History  Procedure Laterality Date  . Neck lesion biopsy  June 24, 2010    Follicular lymphoma  . Skin cancer excision      under left breast; variously described as melanoma and basal cell  . Rotator cuff repair      right  . Patella fracture surgery  1975  . Hematoma evacuation      after melanoma removal  . Portacath placement  07/12/2010  . Colonoscopy  2009    Also underwent an EGD; patient reports no significant findings  . Bone marrow aspiration  06/2012  . Bone marrow biopsy  06/2012  . Fungal infection       Prescriptions prior to admission  Medication Sig Dispense Refill  . acetaminophen (TYLENOL) 325 MG tablet Take 2 tablets (650 mg total) by mouth every 6 (six) hours as needed for fever.      . ALPRAZolam (XANAX) 0.5 MG tablet Take 0.125-0.5  mg by mouth 4 (four) times daily. Normally takes one-fourth to one tablet twice daily, then takes one to two tablets at bedtime if needed for anxiety/sleep      . calcium citrate (CALCITRATE - DOSED IN MG ELEMENTAL CALCIUM) 950 MG tablet Take 1 tablet by mouth 2 (two) times daily.      . carvedilol (COREG) 25 MG tablet Take 1 tablet (25 mg total) by mouth 2 (two) times daily with a meal.      . Cefepime-Dextrose 1 GM/50ML SOLR Inject 1 g into the vein 3 (three) times daily. For 7 days      . diltiazem (CARDIZEM SR) 120 MG 12 hr capsule Take 1 capsule (120 mg total) by mouth every 12 (twelve) hours.      Marland Kitchen guaiFENesin (MUCINEX) 600 MG 12 hr tablet Take 2 tablets (1,200 mg total) by mouth 2 (two) times daily.      . Heparin Lock Flush (HEPARIN FLUSH, PORCINE,) 100 UNIT/ML injection 500 Units by Intracatheter route as needed (Use 5cc Itravenously every shift for IV therapy flush with Heparin after flushing with Normal Saline 0.9% to maintain potency of port.).      Marland Kitchen HYDROcodone-acetaminophen (NORCO/VICODIN) 5-325 MG per tablet Take 1 tablet by mouth every 4 (four) hours as needed.  30 tablet  0  . lovastatin (MEVACOR) 10 MG tablet Take 10 mg by mouth daily.       . Multiple Vitamins-Iron (ONE-TABLET-DAILY/IRON PO) Take 1 tablet by mouth daily.      Marland Kitchen omeprazole (PRILOSEC) 20 MG capsule Take 20 mg by mouth daily.      . pegfilgrastim (NEULASTA) 6 MG/0.6ML injection Inject 6 mg into the skin every 14 (fourteen) days. For a total of 4 doses.      Marland Kitchen PROAIR HFA 108 (90 BASE) MCG/ACT inhaler Inhale 1 puff into the lungs every 6 (six) hours as needed. For shortness of breath      . sodium chloride 0.9 % infusion Inject 125 mLs into the vein daily. 134ml/1hr intravenously every shift for dehydration, pneumonia      . Sodium Chloride Flush (NORMAL SALINE FLUSH) 0.9 % SOLN Inject 10 mLs into the vein daily. Use every shift for IV therapy flush port-a-cath before and after IV and ABT infused      . tamsulosin  (FLOMAX) 0.4 MG CAPS Take 0.4 mg by mouth daily.      . traMADol (ULTRAM) 50 MG tablet Take 50 mg by mouth at bedtime. For pain      . tuberculin 5 UNIT/0.1ML injection Inject 0.1 mLs into the skin once.      . vancomycin (VANCOCIN) 10 G SOLR Inject 1,250 mg into the vein 2 (two) times daily. Administered in of Normal Saline for 7 days      . warfarin (COUMADIN) 5 MG tablet Take 5 mg by mouth every evening.      Marland Kitchen  denosumab (PROLIA) 60 MG/ML SOLN injection Inject 60 mg into the skin every 6 (six) months. Administer in upper arm, thigh, or abdomen      . dextrose 5 % SOLN 50 mL with ceFEPIme 1 G SOLR 1 g Inject 1 g into the vein every 8 (eight) hours.      . filgrastim (NEUPOGEN) 480 MCG/1.6ML injection Inject 1.6 mLs (480 mcg total) into the skin daily at 6 PM.  1.6 mL  0   Physical Exam: Blood pressure 108/67, pulse 77, temperature 99.1 F (37.3 C), temperature source Oral, resp. rate 23, height 5\' 7"  (1.702 m), weight 196 lb 10.4 oz (89.2 kg), SpO2 100.00%.   General: Well developed, well nourished, thin in no acute distress Head: Eyes PERRLA, No xanthomas.   Normal cephalic and atramatic  Lungs: Diminished, poor inspiratory effort, no wheezes or coughing Heart: HRIR S1 S2, modest apical systolic murmur Pulses are 2+ & equal.            No carotid bruit. No JVD.  No abdominal bruits. No femoral bruits. Abdomen: Bowel sounds are positive, abdomen soft and non-tender without masses Msk:  Back normal, Unable to assess strength and tone for age. Extremities: No clubbing, cyanosis or edema.  DP +1 Neuro: Unable to assess Psych:  Unarousable  Telemetry: Atrial fibrillation, moderately frequent PVCs, controlled ventricular rate of 90-100.  Labs: Lab Results  Component Value Date   WBC 8.2 08/10/2012   HGB 10.7* 08/10/2012   HCT 33.2* 08/10/2012   MCV 86.2 08/10/2012   PLT 375 08/10/2012    Recent Labs Lab 08/07/12 1800  08/10/12 0439  NA 138  < > 144  K 3.9  < > 3.0*  CL 108  <  > 102  CO2 22  < > 31  BUN 22  < > 9  CREATININE 0.79  < > 0.73  CALCIUM 7.8*  < > 7.6*  PROT 6.5  --   --   BILITOT 0.2*  --   --   ALKPHOS 182*  --   --   ALT 20  --   --   AST 27  --   --   GLUCOSE 195*  < > 105*  < > = values in this interval not displayed.   Radiology: Dg Chest Port 1 View  08/09/2012   *RADIOLOGY REPORT*  Clinical Data: Pneumonia.  PORTABLE CHEST - 1 VIEW  Comparison: 08/07/2012.  Findings: The heart is enlarged but stable.  There are persistent bilateral lower lobe predominant airspace opacities, most likely pneumonia.  Slight improved aeration on the left.  Bilateral pleural effusions are suspected.  IMPRESSION: Persistent bilateral airspace process with slight improved aeration on the left.   Original Report Authenticated By: Rudie Meyer, M.D.   ASSESSMENT AND PLAN:   1. Acute on Chronic CHF: Likely multifactorial with atrial fib with RVR. Normal EF with moderate to severe MR per recent echo 5.19/2014. Has responded well to diureses. CXR shows improvement in status, but bilateral pleural effusions are still noted. Review of home medications does not demonstrate that he is on diuretic, which he will probably need a discharge.   2. Atrial fibrillation: Currently on IV Cardizem drip with good rate control. Coumadin is being managed by pharmacy. He does not appear to be awake enough  to be taking oral medications at this time. He was noted to be mildly anemic on admission, possibly dilutional vs chronic. Anemia has improved with a hemoglobin of 10.7 and a  hematocrit of 33.2.  3. C-diff Colitis:Being treated with vancomycin per Dr. Juanetta Gosling  4. Sepsis due to pneumonia: On antibiotics per Dr. Juanetta Gosling.  5. Follicular Lymphoma: Followed by oncology.  Bettey Mare. Lyman Bishop NP [facility] 08/10/2012, 8:48 AM  Cardiology Attending Patient interviewed and examined. Discussed with Joni Reining, NP.  Above note annotated and modified based upon my findings.   Echocardiogram will be reviewed-examination suggests neither congestive heart failure, which may have resolved by now, nor severe mitral regurgitation. Mitral valve dysfunction may have been exacerbated by fluid retention and acute CHF.  Patient's mental status has improved, and he is now conversing normally.  Systolic blood pressure on the low side, but adequate.   Weight more than 20 pounds above baseline on admission, but now normalizing. Fluid overload appears to have predated his transfer to a skilled nursing facility.  Furosemide dose is currently relatively modest. We will convert to oral administration in the morning.  [place] Bing, MD 08/10/2012, 5:31 PM

## 2012-08-10 NOTE — Progress Notes (Signed)
eLink Physician-Brief Progress Note Patient Name: Charles [name] DOB: 08-Jan-1941 MRN: [id number]  Date of Service  08/10/2012   HPI/Events of Note  Hypokalemia   eICU Interventions  Potassium replaced   Intervention Category Intermediate Interventions: Electrolyte abnormality - evaluation and management  Dashayla Theissen 08/10/2012, 6:55 AM

## 2012-08-11 DIAGNOSIS — A0472 Enterocolitis due to Clostridium difficile, not specified as recurrent: Secondary | ICD-10-CM | POA: Diagnosis present

## 2012-08-11 DIAGNOSIS — I10 Essential (primary) hypertension: Secondary | ICD-10-CM

## 2012-08-11 LAB — CBC
MCH: 27.5 pg (ref 26.0–34.0)
MCHC: 31.8 g/dL (ref 30.0–36.0)
RDW: 19.3 % — ABNORMAL HIGH (ref 11.5–15.5)

## 2012-08-11 LAB — GLUCOSE, CAPILLARY
Glucose-Capillary: 103 mg/dL — ABNORMAL HIGH (ref 70–99)
Glucose-Capillary: 107 mg/dL — ABNORMAL HIGH (ref 70–99)
Glucose-Capillary: 103 mg/dL — ABNORMAL HIGH (ref 70–99)
Glucose-Capillary: 110 mg/dL — ABNORMAL HIGH (ref 70–99)
Glucose-Capillary: 109 mg/dL — ABNORMAL HIGH (ref 70–99)

## 2012-08-11 LAB — COMPREHENSIVE METABOLIC PANEL
AST: 21 U/L (ref 0–37)
BUN: 11 mg/dL (ref 6–23)
CO2: 30 mEq/L (ref 19–32)
Chloride: 101 mEq/L (ref 96–112)
Creatinine, Ser: 0.75 mg/dL (ref 0.50–1.35)
GFR calc non Af Amer: 89 mL/min — ABNORMAL LOW (ref >90)
Glucose, Bld: 110 mg/dL — ABNORMAL HIGH (ref 70–99)
Sodium: 143 mEq/L (ref 135–145)
Total Bilirubin: 0.3 mg/dL (ref 0.3–1.2)

## 2012-08-11 LAB — PROTIME-INR
INR: 2.48 — ABNORMAL HIGH (ref 0.00–1.49)
Prothrombin Time: 25.7 seconds — ABNORMAL HIGH (ref 11.6–15.2)

## 2012-08-11 MED ORDER — PANTOPRAZOLE SODIUM 40 MG PO TBEC
40.0000 mg | DELAYED_RELEASE_TABLET | Freq: Every evening | ORAL | Status: DC
  Administered 2012-08-11: 40 mg via ORAL
  Filled 2012-08-11: qty 1

## 2012-08-11 MED ORDER — SERTRALINE HCL 50 MG PO TABS
50.0000 mg | ORAL_TABLET | Freq: Every day | ORAL | Status: DC
  Administered 2012-08-11 – 2012-08-13 (×3): 50 mg via ORAL
  Filled 2012-08-11 (×3): qty 1

## 2012-08-11 MED ORDER — POTASSIUM CHLORIDE 10 MEQ/100ML IV SOLN: 10.0000 meq | INTRAVENOUS | Status: DC

## 2012-08-11 MED ORDER — DILTIAZEM HCL 60 MG PO TABS
90.0000 mg | ORAL_TABLET | Freq: Four times a day (QID) | ORAL | Status: AC
  Administered 2012-08-11 – 2012-08-12 (×8): 90 mg via ORAL
  Filled 2012-08-11 (×7): qty 1
  Filled 2012-08-11: qty 2
  Filled 2012-08-11: qty 1

## 2012-08-11 MED ORDER — LISINOPRIL 5 MG PO TABS
2.5000 mg | ORAL_TABLET | Freq: Every day | ORAL | Status: DC
  Administered 2012-08-12: 2.5 mg via ORAL
  Filled 2012-08-11 (×4): qty 1

## 2012-08-11 MED ORDER — WARFARIN SODIUM 2 MG PO TABS
3.0000 mg | ORAL_TABLET | Freq: Once | ORAL | Status: AC
  Administered 2012-08-11: 3 mg via ORAL
  Filled 2012-08-11: qty 1

## 2012-08-11 MED ORDER — LISINOPRIL 10 MG PO TABS: 10.0000 mg | ORAL_TABLET | Freq: Every day | ORAL | Status: DC

## 2012-08-11 MED ORDER — POTASSIUM CHLORIDE 10 MEQ/100ML IV SOLN
10.0000 meq | INTRAVENOUS | Status: AC
  Administered 2012-08-11 (×3): 10 meq via INTRAVENOUS
  Filled 2012-08-11: qty 300

## 2012-08-11 MED ORDER — ASPIRIN EC 325 MG PO TBEC
325.0000 mg | DELAYED_RELEASE_TABLET | Freq: Every day | ORAL | Status: DC
  Administered 2012-08-11 – 2012-08-12 (×2): 325 mg via ORAL
  Filled 2012-08-11: qty 1

## 2012-08-11 NOTE — Progress Notes (Signed)
The patient is receiving Protonix by the intravenous route.  Based on criteria approved by the Pharmacy and Therapeutics Committee and the Medical Executive Committee, the medication is being converted to the equivalent oral dose form.  These criteria include: -No Active GI bleeding -Able to tolerate diet of full liquids (or better) or tube feeding OR able to tolerate other medications by the oral or enteral route  If you have any questions about this conversion, please contact the Pharmacy Department (ext [phone]).  Thank you.  Elson Clan, [facility] 08/11/2012 11:29 AM  *Patient has new diagnosis of Cdiff.  Consider d/c Protonix or change to H2RA (i.e. Pepcid).    Junita Push, PharmD, BCPS

## 2012-08-11 NOTE — Progress Notes (Signed)
Pt to be transferred to room 223 per MD order. Report called to RN. Pt to be transferred via wheelchair with personal belongings. Family members aware of transfer.

## 2012-08-11 NOTE — Progress Notes (Signed)
ANTICOAGULATION CONSULT NOTE   Pharmacy Consult for Warfarin Indication: atrial fibrillation  Allergies  Allergen Reactions  . Statins     Intolerant secondary to severe myalgias   Patient Measurements: Height: 5\' 7"  (170.2 cm) Weight: 190 lb 7.6 oz (86.4 kg) IBW/kg (Calculated) : 66.1  Vital Signs: Temp: 97.5 F (36.4 C) (05/21 0800) Temp src: Oral (05/21 0800) BP: 147/54 mmHg (05/21 0800) Pulse Rate: 96 (05/20 2300)  Labs:  Recent Labs  08/09/12 0438 08/10/12 0439 08/11/12 0441  HGB 10.1* 10.7* 10.6*  HCT 31.8* 33.2* 33.3*  PLT 415* 375 365  LABPROT 27.4* 27.1* 25.7*  INR 2.71* 2.67* 2.48*  CREATININE 0.78 0.73 0.75   Estimated Creatinine Clearance: 86.3 ml/min (by C-G formula based on Cr of 0.75).  Medical History: Past Medical History  Diagnosis Date  . Hypertension   . Atrial fibrillation 10/2008  . Nonischemic cardiomyopathy 02/2009    Presented with CHF; EF of 25-30%; Nonobstructive ASCVD-worst lesion 40% on coronary angiography  . Peptic ulcer disease   . Degenerative joint disease     Knees and shoulders  . Pneumonia 2010  . Low TSH level     Borderline  . Allergic rhinitis   . Anxiety   . Drug abuse   . Tobacco abuse     50 pack years  . 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 Basal cell carcinoma    Medications:  Scheduled:  . aspirin EC  325 mg Oral Daily  . ceFEPime (MAXIPIME) IV  1 g Intravenous Q8H  . diltiazem  90 mg Oral Q6H  . feeding supplement  237 mL Oral BID BM  . furosemide  60 mg Oral Daily  . insulin aspart  0-9 Units Subcutaneous Q4H  . lisinopril  2.5 mg Oral Daily  . Living Better with Heart Failure Book   Does not apply Once  . pantoprazole (PROTONIX) IV  40 mg Intravenous Q24H  . potassium chloride  10 mEq Intravenous Q1 Hr x 3  . sodium chloride  3 mL Intravenous Q12H  . sodium chloride   Intravenous Q12H  . traZODone  50 mg Oral QHS  . vancomycin  250 mg Oral Q6H  . vancomycin  1,000 mg Intravenous Q12H  . warfarin  3  mg Oral ONCE-1800  . Warfarin - Pharmacist Dosing Inpatient   Does not apply Q24H   Assessment: Patient on chronic Warfarin 5 mg po daily for Afib.   INR has been therapeutic this admission on 2.5mg  daily but is trending down.  No bleeding noted.  Pt is also on aspirin therapy.  Goal of Therapy:  INR 2-3   Plan:  Coumadin 3 mg po today x 1 INR/PT daily **Consider decrease aspirin to 81mg  EC while on therapeutic Coumadin to decrease bleeding risk.   Elson Clan 08/11/2012,9:08 AM

## 2012-08-11 NOTE — Progress Notes (Signed)
Subjective:  Breathing easier  Objective:  Vital Signs in the last 24 hours: Temp:  [97.5 F (36.4 C)-99.1 F (37.3 C)] 97.5 F (36.4 C) (05/21 0800) Pulse Rate:  [47-102] 96 (05/20 2300) Resp:  [18-31] 26 (05/21 0800) BP: (86-147)/(51-86) 147/54 mmHg (05/21 0800) SpO2:  [94 %-100 %] 100 % (05/20 2300) Weight:  [190 lb 7.6 oz (86.4 kg)] 190 lb 7.6 oz (86.4 kg) (05/21 0500)  Intake/Output from previous day: 05/20 0701 - 05/21 0700 In: 1244.3 [P.O.:350; I.V.:394.3; IV Piggyback:500] Out: 3125 [Urine:3125] Intake/Output from this shift: Total I/O In: 15 [I.V.:15] Out: -   Physical Exam: NECK: Without JVD, HJR, or bruit LUNGS: Decreased breath sounds with few basilar crackles HEART:Irregular rate and rhythm, 2/6 systolic murmur apex,no gallop, rub, bruit, thrill, or heave EXTREMITIES: Without cyanosis, clubbing, or edema   Lab Results:  Recent Labs  08/10/12 0439 08/11/12 0441  WBC 8.2 7.8  HGB 10.7* 10.6*  PLT 375 365    Recent Labs  08/10/12 0439 08/11/12 0441  NA 144 143  K 3.0* 3.5  CL 102 101  CO2 31 30  GLUCOSE 105* 110*  BUN 9 11  CREATININE 0.73 0.75   No results found for this basename: TROPONINI, CK, MB,  in the last 72 hours Hepatic Function Panel  Recent Labs  08/11/12 0441  PROT 6.9  ALBUMIN 3.0*  AST 21  ALT 16  ALKPHOS 112  BILITOT 0.3    Imaging: 2Decho 08/09/12: Study Conclusions  - Left ventricle: The cavity size was mildly dilated. Wall   thickness was increased in a pattern of mild LVH. Systolic   function was normal. The estimated ejection fraction was   in the range of 50% to 55%. - Mitral valve: MR is at least moderate-severe inseverity.   There is an central jet of MR and also an eccentric jet   that is directed posterior into LA. - Left atrium: The atrium was severely dilated. - Right atrium: The atrium was mildly to moderately dilated. - Pulmonary arteries: PA peak pressure: 44mm Hg (S). Transthoracic  echocardiography.  M-mode, complete 2D, spectral Doppler, and color Doppler.  Height:  Height: 170.2cm. Height: 67in.  Weight:  Weight: 90.3kg. Weight: 198.6lb.  Body mass index:  BMI: 31.2kg/m^2.  Body surface area:    BSA: 2.62m^2.  Patient status:  Inpatient. Location:  ICU/CCU  ------------------------------------------------------------  ------------------------------------------------------------ Left ventricle:  The cavity size was mildly dilated. Wall thickness was increased in a pattern of mild LVH. Systolic function was normal. The estimated ejection fraction was in the range of 50% to 55%.   Cardiac Studies:  Assessment/Plan:  ASSESSMENT AND PLAN:   1. Acute on Chronic CHF: Likely multifactorial with atrial fib with RVR. Normal EF with moderate to severe MR per recent echo 5.19/2014. Has responded well to diureses. Lasix switched to po today. Down 22 lbs.  2. Atrial fibrillation: Currently switching to po Cardizem. Coumadin is being managed by pharmacy.   3. C-diff Colitis:Being treated with vancomycin per Dr. Juanetta Gosling  4. Sepsis due to pneumonia: On antibiotics per Dr. Juanetta Gosling.  5. Follicular Lymphoma: Followed by oncology.  Charles [name] 08/11/2012, 8:31 AM   Attending note:  Patient seen and examined. Discussed the case with Ms. Geni Bers PA-C. Patient is improving clinically, has had a substantial weight loss with diuresis of approximately 22 pounds. Remains in atrial fibrillation with reasonable heart rate control, currently switching from IV to oral Cardizem. Renal function remained stable with creatinine 0.7 and INR is  therapeutic at 2.8. No change to current regimen.  Charles [name], M.D., F.A.C.C.

## 2012-08-11 NOTE — Progress Notes (Signed)
NAME:  Charles [name], Charles [name]             ACCOUNT NO.:  [id number]  MEDICAL RECORD NO.:  [id number]  LOCATION:  IC03                          FACILITY:  [facility]  PHYSICIAN:  Casin Federici G. Renard Matter, MD   DATE OF BIRTH:  01-28-1941  DATE OF PROCEDURE: DATE OF DISCHARGE:                                PROGRESS NOTE   This patient had reasonably comfortable night.  He has been more controlled right now, he has been treated for atrial fibrillation with rapid ventricular rate, remains on IV Cardizem, but lower dosage.  He has acute systolic congestive heart failure and pleural effusions with ejection fraction 25-30% and elevated pro BNP, questionable pneumonia. He remains on vancomycin, Maxipime.  He is diuresing.  His other problems of diabetes with lymphoma, previous septic knee with calcium pyrophosphate, crystals arthritis and is on chronic anticoagulation.  OBJECTIVE:  VITAL SIGNS:  Blood pressure 100/56, respirations 26, pulse 96, temp 98. HEENT:  Negative. NECK:  Supple.  No JVD or thyroid abnormalities. HEART:  Irregular rhythm. LUNGS:  Occasional rhonchus heard over lower lung field. ABDOMEN:  No palpable organs or masses. EXTREMITIES:  Free of edema.  ASSESSMENT:  The patient has atrial fibrillation and more controlled right now, on Cardizem IV, lower dosage.  He does have systolic congestive heart failure with low ejection fraction 25-30%.  He is diuresing and has improved.  He does have healthcare-associated pneumonia, lymphoma, history of septic knee.  He does have hypokalemia.  PLAN:  To give runs of potassium again today.  Continue vancomycin and Maxipime protocol.     Seven Dollens G. Renard Matter, MD     AGM/MEDQ  D:  08/11/2012  T:  08/11/2012  Job:  [id number]

## 2012-08-11 NOTE — Progress Notes (Signed)
NAME:  Charles [name], Charles [name]             ACCOUNT NO.:  [id number]  MEDICAL RECORD NO.:  [id number]  LOCATION:                                 FACILITY:  PHYSICIAN:  Camesha Farooq G. Renard Matter, MD   DATE OF BIRTH:  [birth year]  DATE OF PROCEDURE:  08/10/2012 DATE OF DISCHARGE:                                PROGRESS NOTE   SUBJECTIVE:  This patient is much more alert today.  He has been in some degree of respiratory distress, but has improved.  He has the following problems, atrial fibrillation with rapid ventricular response, acute systolic congestive heart failure with elevated proBNP, healthcare- associated pneumonia, lymphoma, hyperglycemia, and C. difficile colitis. The patient's breathing has improved.  He is having less diarrhea now. He has lost considerable weight since admission.  His weight is down from 220 to 196 pounds.  He has been diuresing effectively, remains on Cardizem drip to control his Afib.  OBJECTIVE:  GENERAL:  Alert male. VITAL SIGNS:  Blood pressure 119/55, respiration 30, pulse 99, temp 99.4. HEENT:  Eyes; PERRLA.  TMs negative.  Oropharynx benign. NECK:  Supple.  No JVD or thyroid abnormalities. HEART:  Irregular rhythm. LUNGS:  Rhonchi bilaterally. ABDOMEN:  No palpable organs or masses.  No organomegaly. EXTREMITIES:  Has mild edema.  ASSESSMENT: 1. The patient has chronic atrial fibrillation.  Currently on Cardizem     drip. 2. Acute systolic congestive heart failure.  The patient is being     diuresed effectively. 3. C. difficile colitis being treated with p.o. vancomycin. 4. Chronic lymphoma being followed by Hematology. 5. Healthcare-associated pneumonia.  Currently, on vancomycin and     Maxipime.  PLAN:  To continue current regimen.     Charles [name] G. Renard Matter, MD     AGM/MEDQ  D:  08/10/2012  T:  08/11/2012  Job:  [id number]

## 2012-08-11 NOTE — Progress Notes (Signed)
Subjective: He says he feels better. His breathing is pretty good. He does not have much edema now.  Objective: Vital signs in last 24 hours: Temp:  [97.8 F (36.6 C)-99.1 F (37.3 C)] 98 F (36.7 C) (05/21 0400) Pulse Rate:  [47-102] 96 (05/20 2300) Resp:  [18-30] 26 (05/21 0700) BP: (86-119)/(51-86) 103/56 mmHg (05/21 0700) SpO2:  [94 %-100 %] 100 % (05/20 2300) Weight:  [86.4 kg (190 lb 7.6 oz)] 86.4 kg (190 lb 7.6 oz) (05/21 0500) Weight change: -2.8 kg (-6 lb 2.8 oz) Last BM Date: 08/09/12  Intake/Output from previous day: 05/20 0701 - 05/21 0700 In: 1049.3 [P.O.:350; I.V.:199.3; IV Piggyback:500] Out: 3125 [Urine:3125]  PHYSICAL EXAM General appearance: alert, cooperative and no distress Resp: rhonchi bilaterally Cardio: irregularly irregular rhythm GI: soft, non-tender; bowel sounds normal; no masses,  no organomegaly Extremities: edema Trace  Lab Results:    Basic Metabolic Panel:  Recent Labs  40/98/11 0439 08/11/12 0441  NA 144 143  K 3.0* 3.5  CL 102 101  CO2 31 30  GLUCOSE 105* 110*  BUN 9 11  CREATININE 0.73 0.75  CALCIUM 7.6* 7.3*   Liver Function Tests:  Recent Labs  08/11/12 0441  AST 21  ALT 16  ALKPHOS 112  BILITOT 0.3  PROT 6.9  ALBUMIN 3.0*   No results found for this basename: LIPASE, AMYLASE,  in the last 72 hours No results found for this basename: AMMONIA,  in the last 72 hours CBC:  Recent Labs  08/10/12 0439 08/11/12 0441  WBC 8.2 7.8  HGB 10.7* 10.6*  HCT 33.2* 33.3*  MCV 86.2 86.5  PLT 375 365   Cardiac Enzymes: No results found for this basename: CKTOTAL, CKMB, CKMBINDEX, TROPONINI,  in the last 72 hours BNP: No results found for this basename: PROBNP,  in the last 72 hours D-Dimer: No results found for this basename: DDIMER,  in the last 72 hours CBG:  Recent Labs  08/10/12 0736 08/10/12 1233 08/10/12 1608 08/10/12 1957 08/11/12 0023 08/11/12 0410  GLUCAP 101* 123* 126* 110* 108* 103*    Hemoglobin A1C: No results found for this basename: HGBA1C,  in the last 72 hours Fasting Lipid Panel: No results found for this basename: CHOL, HDL, LDLCALC, TRIG, CHOLHDL, LDLDIRECT,  in the last 72 hours Thyroid Function Tests: No results found for this basename: TSH, T4TOTAL, FREET4, T3FREE, THYROIDAB,  in the last 72 hours Anemia Panel: No results found for this basename: VITAMINB12, FOLATE, FERRITIN, TIBC, IRON, RETICCTPCT,  in the last 72 hours Coagulation:  Recent Labs  08/10/12 0439 08/11/12 0441  LABPROT 27.1* 25.7*  INR 2.67* 2.48*   Urine Drug Screen: Drugs of Abuse  No results found for this basename: labopia, cocainscrnur, labbenz, amphetmu, thcu, labbarb    Alcohol Level: No results found for this basename: ETH,  in the last 72 hours Urinalysis: No results found for this basename: COLORURINE, APPERANCEUR, LABSPEC, PHURINE, GLUCOSEU, HGBUR, BILIRUBINUR, KETONESUR, PROTEINUR, UROBILINOGEN, NITRITE, LEUKOCYTESUR,  in the last 72 hours Misc. Labs:  ABGS  Recent Labs  08/09/12 0545  PHART 7.523*  PO2ART 58.4*  TCO2 26.4  HCO3 28.9*   CULTURES Recent Results (from the past 240 hour(s))  CULTURE, EXPECTORATED SPUTUM-ASSESSMENT     Status: None   Collection Time    08/01/12  1:01 PM      Result Value Range Status   Specimen Description SPU   Final   Special Requests NONE   Final   Sputum evaluation  Final   Value: MICROSCOPIC FINDINGS SUGGEST THAT THIS SPECIMEN IS NOT REPRESENTATIVE OF LOWER RESPIRATORY SECRETIONS. PLEASE RECOLLECT.     Gram Stain Report Called to,Read Back By and Verified With: WARD, S. AT 15:03PM ON 08/01/12 BY PRUITT, C.   Report Status 08/01/2012 FINAL   Final  BODY FLUID CULTURE     Status: None   Collection Time    08/02/12  9:42 AM      Result Value Range Status   Specimen Description FLUID KNEE JOINT   Final   Special Requests NONE   Final   Gram Stain     Final   Value: WBC PRESENT, PREDOMINANTLY PMN     NO ORGANISMS  SEEN   Culture NO GROWTH 3 DAYS   Final   Report Status 08/06/2012 FINAL   Final  URINE CULTURE     Status: None   Collection Time    08/07/12  5:09 PM      Result Value Range Status   Specimen Description URINE, CLEAN CATCH   Final   Special Requests NONE   Final   Culture  Setup Time 08/08/2012 19:26   Final   Colony Count NO GROWTH   Final   Culture NO GROWTH   Final   Report Status 08/09/2012 FINAL   Final  CLOSTRIDIUM DIFFICILE BY PCR     Status: Abnormal   Collection Time    08/09/12  6:13 AM      Result Value Range Status   C difficile by pcr POSITIVE (*) NEGATIVE Final   Comment: CRITICAL RESULT CALLED TO, READ BACK BY AND VERIFIED WITH:     WARD,S. AT 1242 ON 08/09/2012 BY BAUGHAM,M.   Studies/Results: No results found.  Medications:  Prior to Admission:  Prescriptions prior to admission  Medication Sig Dispense Refill  . acetaminophen (TYLENOL) 325 MG tablet Take 2 tablets (650 mg total) by mouth every 6 (six) hours as needed for fever.      . ALPRAZolam (XANAX) 0.5 MG tablet Take 0.125-0.5 mg by mouth 4 (four) times daily. Normally takes one-fourth to one tablet twice daily, then takes one to two tablets at bedtime if needed for anxiety/sleep      . calcium citrate (CALCITRATE - DOSED IN MG ELEMENTAL CALCIUM) 950 MG tablet Take 1 tablet by mouth 2 (two) times daily.      . carvedilol (COREG) 25 MG tablet Take 1 tablet (25 mg total) by mouth 2 (two) times daily with a meal.      . Cefepime-Dextrose 1 GM/50ML SOLR Inject 1 g into the vein 3 (three) times daily. For 7 days      . diltiazem (CARDIZEM SR) 120 MG 12 hr capsule Take 1 capsule (120 mg total) by mouth every 12 (twelve) hours.      Marland Kitchen guaiFENesin (MUCINEX) 600 MG 12 hr tablet Take 2 tablets (1,200 mg total) by mouth 2 (two) times daily.      . Heparin Lock Flush (HEPARIN FLUSH, PORCINE,) 100 UNIT/ML injection 500 Units by Intracatheter route as needed (Use 5cc Itravenously every shift for IV therapy flush with  Heparin after flushing with Normal Saline 0.9% to maintain potency of port.).      Marland Kitchen HYDROcodone-acetaminophen (NORCO/VICODIN) 5-325 MG per tablet Take 1 tablet by mouth every 4 (four) hours as needed.  30 tablet  0  . lovastatin (MEVACOR) 10 MG tablet Take 10 mg by mouth daily.       . Multiple Vitamins-Iron (ONE-TABLET-DAILY/IRON PO)  Take 1 tablet by mouth daily.      Marland Kitchen omeprazole (PRILOSEC) 20 MG capsule Take 20 mg by mouth daily.      . pegfilgrastim (NEULASTA) 6 MG/0.6ML injection Inject 6 mg into the skin every 14 (fourteen) days. For a total of 4 doses.      Marland Kitchen PROAIR HFA 108 (90 BASE) MCG/ACT inhaler Inhale 1 puff into the lungs every 6 (six) hours as needed. For shortness of breath      . sodium chloride 0.9 % infusion Inject 125 mLs into the vein daily. 159ml/1hr intravenously every shift for dehydration, pneumonia      . Sodium Chloride Flush (NORMAL SALINE FLUSH) 0.9 % SOLN Inject 10 mLs into the vein daily. Use every shift for IV therapy flush port-a-cath before and after IV and ABT infused      . tamsulosin (FLOMAX) 0.4 MG CAPS Take 0.4 mg by mouth daily.      . traMADol (ULTRAM) 50 MG tablet Take 50 mg by mouth at bedtime. For pain      . tuberculin 5 UNIT/0.1ML injection Inject 0.1 mLs into the skin once.      . vancomycin (VANCOCIN) 10 G SOLR Inject 1,250 mg into the vein 2 (two) times daily. Administered in of Normal Saline for 7 days      . warfarin (COUMADIN) 5 MG tablet Take 5 mg by mouth every evening.      . denosumab (PROLIA) 60 MG/ML SOLN injection Inject 60 mg into the skin every 6 (six) months. Administer in upper arm, thigh, or abdomen      . dextrose 5 % SOLN 50 mL with ceFEPIme 1 G SOLR 1 g Inject 1 g into the vein every 8 (eight) hours.      . filgrastim (NEUPOGEN) 480 MCG/1.6ML injection Inject 1.6 mLs (480 mcg total) into the skin daily at 6 PM.  1.6 mL  0   Scheduled: . aspirin EC  325 mg Oral Daily  . ceFEPime (MAXIPIME) IV  1 g Intravenous Q8H  .  diltiazem  90 mg Oral Q6H  . feeding supplement  237 mL Oral BID BM  . furosemide  60 mg Oral Daily  . insulin aspart  0-9 Units Subcutaneous Q4H  . lisinopril  2.5 mg Oral Daily  . Living Better with Heart Failure Book   Does not apply Once  . pantoprazole (PROTONIX) IV  40 mg Intravenous Q24H  . potassium chloride  10 mEq Intravenous Q1 Hr x 3  . sodium chloride  3 mL Intravenous Q12H  . sodium chloride   Intravenous Q12H  . traZODone  50 mg Oral QHS  . vancomycin  250 mg Oral Q6H  . vancomycin  1,000 mg Intravenous Q12H  . Warfarin - Pharmacist Dosing Inpatient   Does not apply Q24H   Continuous:  KGM:WNUUVO chloride, acetaminophen, acetaminophen, ALPRAZolam, heparin lock flush, ondansetron (ZOFRAN) IV, sodium chloride  Assesment: He had acute congestive heart failure. He has healthcare associated pneumonia. He has atrial fibrillation and had a rapid ventricular response and has been on IV diltiazem. He has C. difficile colitis which is being treated. He is hopeful of moving out of the ICU today Active Problems:   Atrial fibrillation   Chronic anticoagulation   Follicular lymphoma   HCAP (healthcare-associated pneumonia)   Atrial fibrillation with rapid ventricular response   Acute congestive heart failure   Hyperglycemia    Plan: I think it's okay to switch him to oral diltiazem.  His Lasix has already been changed.    LOS: 4 days   Charles [name] 08/11/2012, 8:02 AM

## 2012-08-12 ENCOUNTER — Inpatient Hospital Stay (HOSPITAL_COMMUNITY): Payer: Medicare Other

## 2012-08-12 LAB — GLUCOSE, CAPILLARY
Glucose-Capillary: 106 mg/dL — ABNORMAL HIGH (ref 70–99)
Glucose-Capillary: 129 mg/dL — ABNORMAL HIGH (ref 70–99)
Glucose-Capillary: 97 mg/dL (ref 70–99)

## 2012-08-12 MED ORDER — ALPRAZOLAM 0.5 MG PO TABS
0.5000 mg | ORAL_TABLET | ORAL | Status: DC | PRN
  Administered 2012-08-12 – 2012-08-13 (×4): 0.5 mg via ORAL
  Filled 2012-08-12 (×4): qty 1

## 2012-08-12 MED ORDER — POTASSIUM CHLORIDE CRYS ER 20 MEQ PO TBCR
20.0000 meq | EXTENDED_RELEASE_TABLET | Freq: Every day | ORAL | Status: DC
  Administered 2012-08-13: 20 meq via ORAL
  Filled 2012-08-12: qty 1

## 2012-08-12 MED ORDER — POTASSIUM CHLORIDE CRYS ER 20 MEQ PO TBCR
40.0000 meq | EXTENDED_RELEASE_TABLET | ORAL | Status: AC
  Administered 2012-08-12 (×2): 40 meq via ORAL
  Filled 2012-08-12 (×2): qty 2

## 2012-08-12 MED ORDER — LISINOPRIL 5 MG PO TABS
5.0000 mg | ORAL_TABLET | Freq: Every day | ORAL | Status: DC
  Administered 2012-08-13: 5 mg via ORAL
  Filled 2012-08-12: qty 1

## 2012-08-12 MED ORDER — WARFARIN SODIUM 5 MG PO TABS
5.0000 mg | ORAL_TABLET | Freq: Once | ORAL | Status: AC
  Administered 2012-08-12: 5 mg via ORAL
  Filled 2012-08-12: qty 1

## 2012-08-12 MED ORDER — DILTIAZEM HCL ER COATED BEADS 180 MG PO CP24
360.0000 mg | ORAL_CAPSULE | Freq: Every day | ORAL | Status: DC
  Administered 2012-08-13: 360 mg via ORAL
  Filled 2012-08-12: qty 2

## 2012-08-12 NOTE — Progress Notes (Signed)
ANTIBIOTIC CONSULT NOTE   Pharmacy Consult for Vancomycin and Cefepime Indication: pneumonia  Allergies  Allergen Reactions  . Statins     Intolerant secondary to severe myalgias   Patient Measurements: Height: 5\' 7"  (170.2 cm) Weight: 189 lb 6 oz (85.9 kg) IBW/kg (Calculated) : 66.1  Vital Signs: Temp: 98.1 F (36.7 C) (05/22 1610) Temp src: Axillary (05/22 0613) BP: 127/71 mmHg (05/22 9604) Pulse Rate: 99 (05/22 0613) Intake/Output from previous day: 05/21 0701 - 05/22 0700 In: 1761.3 [P.O.:615; I.V.:46.3; IV Piggyback:1100] Out: 1500 [Urine:1500] Intake/Output from this shift: Total I/O In: -  Out: 700 [Urine:700]  Labs:  Recent Labs  08/10/12 0439 08/11/12 0441  WBC 8.2 7.8  HGB 10.7* 10.6*  PLT 375 365  CREATININE 0.73 0.75   Estimated Creatinine Clearance: 86.1 ml/min (by C-G formula based on Cr of 0.75).  Recent Labs  08/09/12 1100  VANCOTROUGH 25.6*    Microbiology: Recent Results (from the past 720 hour(s))  CULTURE, BLOOD (ROUTINE X 2)     Status: None   Collection Time    07/30/12 12:30 PM      Result Value Range Status   Specimen Description BLOOD LEFT ARM   Final   Special Requests BOTTLES DRAWN AEROBIC AND ANAEROBIC  8 CC EACH   Final   Culture  Setup Time 07/31/2012 19:44   Final   Culture     Final   Value: GROUP A STREP (S.PYOGENES) ISOLATED     Note: Gram Stain Report Called to,Read Back By and Verified With: SMITH J @ 0150 07/31/12 BY Cristela Blue J Performed at [facility] CRITICAL RESULT CALLED TO, READ BACK BY AND VERIFIED WITH: SHARON WARD 08/01/12 @ 10:04AM BY RUSCA.   Report Status 08/01/2012 FINAL   Final  CULTURE, BLOOD (ROUTINE X 2)     Status: None   Collection Time    07/30/12 12:45 PM      Result Value Range Status   Specimen Description BLOOD RIGHT ARM   Final   Special Requests BOTTLES DRAWN AEROBIC AND ANAEROBIC  6 CC EACH   Final   Culture  Setup Time 07/31/2012 02:50   Final   Culture     Final   Value: GROUP A  STREP (S.PYOGENES) ISOLATED     Note: Gram Stain Report Called to,Read Back By and Verified With: SMITH J @ 0150 07/31/12 BY Cristela Blue J Performed at [facility] CRITICAL RESULT CALLED TO, READ BACK BY AND VERIFIED WITH: SHARON WARD 08/01/12 @ 10:04AM BY RUSCA.   Report Status 08/01/2012 FINAL   Final  MRSA PCR SCREENING     Status: None   Collection Time    07/30/12  7:22 PM      Result Value Range Status   MRSA by PCR NEGATIVE  NEGATIVE Final   Comment:            The GeneXpert MRSA Assay (FDA     approved for NASAL specimens     only), is one component of a     comprehensive MRSA colonization     surveillance program. It is not     intended to diagnose MRSA     infection nor to guide or     monitor treatment for     MRSA infections.  CULTURE, EXPECTORATED SPUTUM-ASSESSMENT     Status: None   Collection Time    08/01/12  1:01 PM      Result Value Range Status   Specimen Description SPU  Final   Special Requests NONE   Final   Sputum evaluation     Final   Value: MICROSCOPIC FINDINGS SUGGEST THAT THIS SPECIMEN IS NOT REPRESENTATIVE OF LOWER RESPIRATORY SECRETIONS. PLEASE RECOLLECT.     Gram Stain Report Called to,Read Back By and Verified With: WARD, S. AT 15:03PM ON 08/01/12 BY PRUITT, C.   Report Status 08/01/2012 FINAL   Final  BODY FLUID CULTURE     Status: None   Collection Time    08/02/12  9:42 AM      Result Value Range Status   Specimen Description FLUID KNEE JOINT   Final   Special Requests NONE   Final   Gram Stain     Final   Value: WBC PRESENT, PREDOMINANTLY PMN     NO ORGANISMS SEEN   Culture NO GROWTH 3 DAYS   Final   Report Status 08/06/2012 FINAL   Final  URINE CULTURE     Status: None   Collection Time    08/07/12  5:09 PM      Result Value Range Status   Specimen Description URINE, CLEAN CATCH   Final   Special Requests NONE   Final   Culture  Setup Time 08/08/2012 19:26   Final   Colony Count NO GROWTH   Final   Culture NO GROWTH   Final    Report Status 08/09/2012 FINAL   Final  CLOSTRIDIUM DIFFICILE BY PCR     Status: Abnormal   Collection Time    08/09/12  6:13 AM      Result Value Range Status   C difficile by pcr POSITIVE (*) NEGATIVE Final   Comment: CRITICAL RESULT CALLED TO, READ BACK BY AND VERIFIED WITH:     WARD,S. AT 1242 ON 08/09/2012 BY BAUGHAM,M.   Medical History: Past Medical History  Diagnosis Date  . Hypertension   . Atrial fibrillation 10/2008  . Nonischemic cardiomyopathy 02/2009    Presented with CHF; EF of 25-30%; Nonobstructive ASCVD-worst lesion 40% on coronary angiography  . Peptic ulcer disease   . Degenerative joint disease     Knees and shoulders  . Pneumonia 2010  . Low TSH level     Borderline  . Allergic rhinitis   . Anxiety   . Drug abuse   . Tobacco abuse     50 pack years  . 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 Basal cell carcinoma    Medications:  Scheduled:  . aspirin EC  325 mg Oral Daily  . ceFEPime (MAXIPIME) IV  1 g Intravenous Q8H  . diltiazem  90 mg Oral Q6H  . feeding supplement  237 mL Oral BID BM  . furosemide  60 mg Oral Daily  . insulin aspart  0-9 Units Subcutaneous Q4H  . lisinopril  2.5 mg Oral Daily  . Living Better with Heart Failure Book   Does not apply Once  . pantoprazole  40 mg Oral QPM  . sertraline  50 mg Oral Daily  . sodium chloride  3 mL Intravenous Q12H  . sodium chloride   Intravenous Q12H  . traZODone  50 mg Oral QHS  . vancomycin  250 mg Oral Q6H  . vancomycin  1,000 mg Intravenous Q12H  . warfarin  5 mg Oral ONCE-1800  . Warfarin - Pharmacist Dosing Inpatient   Does not apply Q24H   Assessment: 72 yo M continues on antibiotics from nursing home.   At previous discharge on 5/15 these were ordered to continue x7  days at nursing home which patient has completed.  Patient is now being treated for C.Diff.  Renal function has been stable.   Dose was adjusted for elevated trough level on 5/19.   Goal of Therapy:  Vancomycin trough level 15-20  mcg/ml  Plan: Vancomycin to 1000mg  IV every 12 hours Vancomycin trough level weekly while on Vancomycin Cefepime 1 GM IV every 8 hours Monitor renal function and cx data  Duration of therapy per MD -consider d/c antibiotics Labs per protocol  Elson Clan 08/12/2012,8:24 AM

## 2012-08-12 NOTE — Progress Notes (Signed)
ANTICOAGULATION CONSULT NOTE   Pharmacy Consult for Warfarin Indication: atrial fibrillation  Allergies  Allergen Reactions  . Statins     Intolerant secondary to severe myalgias   Patient Measurements: Height: 5\' 7"  (170.2 cm) Weight: 189 lb 6 oz (85.9 kg) IBW/kg (Calculated) : 66.1  Vital Signs: Temp: 98.1 F (36.7 C) (05/22 0613) Temp src: Axillary (05/22 0613) BP: 127/71 mmHg (05/22 0613) Pulse Rate: 99 (05/22 0613)  Labs:  Recent Labs  08/10/12 0439 08/11/12 0441 08/12/12 0443  HGB 10.7* 10.6*  --   HCT 33.2* 33.3*  --   PLT 375 365  --   LABPROT 27.1* 25.7* 24.8*  INR 2.67* 2.48* 2.37*  CREATININE 0.73 0.75  --    Estimated Creatinine Clearance: 86.1 ml/min (by C-G formula based on Cr of 0.75).  Medical History: Past Medical History  Diagnosis Date  . Hypertension   . Atrial fibrillation 10/2008  . Nonischemic cardiomyopathy 02/2009    Presented with CHF; EF of 25-30%; Nonobstructive ASCVD-worst lesion 40% on coronary angiography  . Peptic ulcer disease   . Degenerative joint disease     Knees and shoulders  . Pneumonia 2010  . Low TSH level     Borderline  . Allergic rhinitis   . Anxiety   . Drug abuse   . Tobacco abuse     50 pack years  . 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 Basal cell carcinoma    Medications:  Scheduled:  . aspirin EC  325 mg Oral Daily  . ceFEPime (MAXIPIME) IV  1 g Intravenous Q8H  . diltiazem  90 mg Oral Q6H  . feeding supplement  237 mL Oral BID BM  . furosemide  60 mg Oral Daily  . insulin aspart  0-9 Units Subcutaneous Q4H  . lisinopril  2.5 mg Oral Daily  . Living Better with Heart Failure Book   Does not apply Once  . pantoprazole  40 mg Oral QPM  . sertraline  50 mg Oral Daily  . sodium chloride  3 mL Intravenous Q12H  . sodium chloride   Intravenous Q12H  . traZODone  50 mg Oral QHS  . vancomycin  250 mg Oral Q6H  . vancomycin  1,000 mg Intravenous Q12H  . Warfarin - Pharmacist Dosing Inpatient   Does not  apply Q24H   Assessment: Patient on chronic Warfarin 5 mg po daily for Afib.   INR has been therapeutic this admission but is trending down.  No bleeding noted.  Pt is also on aspirin therapy.  Goal of Therapy:  INR 2-3   Plan:  Coumadin 5 mg po today x 1 INR/PT daily **Consider decrease aspirin to 81mg  EC while on therapeutic Coumadin to decrease bleeding risk.   Elson Clan 08/12/2012,8:20 AM

## 2012-08-12 NOTE — Progress Notes (Signed)
UR Chart Review Completed  

## 2012-08-12 NOTE — Progress Notes (Signed)
Charles [name]  72 y.o.  male  Subjective: Feels fine; denies chest pain or dyspnea; requesting discharge.  Allergy: Statins  Objective: Vital signs in last 24 hours: Temp:  [97.3 F (36.3 C)-98.1 F (36.7 C)] 97.3 F (36.3 C) (05/22 1418) Pulse Rate:  [99-111] 100 (05/22 1418) Resp:  [20-22] 20 (05/22 1418) BP: (92-127)/(54-71) 113/65 mmHg (05/22 1418) SpO2:  [95 %-99 %] 99 % (05/22 1617) Weight:  [85.9 kg (189 lb 6 oz)] 85.9 kg (189 lb 6 oz) (05/22 0613)  85.9 kg (189 lb 6 oz) Body mass index is 29.65 kg/(m^2).  Weight change: -0.5 kg (-1 lb 1.6 oz) Last BM Date: 08/11/12  Intake/Output from previous day: 05/21 0701 - 05/22 0700 In: 1761.3 [P.O.:615; I.V.:46.3; IV Piggyback:1100] Out: 1500 [Urine:1500] Total I/O since admission:  -18.5 L Weight: Decreased 15 kg since admission;  General- Well developed; no acute distress  Neck- No JVD, no carotid bruits Lungs- clear lung fields; normal I:E ratio Cardiovascular- normal PMI; normal S1 and S2 Abdomen- normal bowel sounds; soft and non-tender without masses or organomegaly Skin- Warm, no significant lesions Extremities- Nl distal pulses; no edema  Lab Results: CBC:   Recent Labs  08/10/12 0439 08/11/12 0441  WBC 8.2 7.8  HGB 10.7* 10.6*  HCT 33.2* 33.3*  PLT 375 365   BMET:  Recent Labs  08/10/12 0439 08/11/12 0441  NA 144 143  K 3.0* 3.5  CL 102 101  CO2 31 30  GLUCOSE 105* 110*  BUN 9 11  CREATININE 0.73 0.75  CALCIUM 7.6* 7.3*   Hepatic Function:   Recent Labs  08/11/12 0441  PROT 6.9  ALBUMIN 3.0*  AST 21  ALT 16  ALKPHOS 112  BILITOT 0.3   GFR:  Estimated Creatinine Clearance: 86.1 ml/min (by C-G formula based on Cr of 0.75). Lipids:  Lipid Panel     Component Value Date/Time   CHOL 152 07/15/2011 0856   TRIG 102 07/15/2011 0856   HDL 41 07/15/2011 0856   CHOLHDL 3.7 07/15/2011 0856   VLDL 20 07/15/2011 0856   LDLCALC 91 07/15/2011 0856   Telemetry:   Imaging Studies/Results: CXR  on 5/19: Persistent bibasilar infiltrates; possible small bilateral pleural effusions  Imaging: Imaging results have been reviewed  Medications:  I have reviewed the patient's current medications. Scheduled: . aspirin EC  325 mg Oral Daily  . diltiazem  90 mg Oral Q6H  . feeding supplement  237 mL Oral BID BM  . furosemide  60 mg Oral Daily  . insulin aspart  0-9 Units Subcutaneous Q4H  . lisinopril  2.5 mg Oral Daily  . Living Better with Heart Failure Book   Does not apply Once  . sertraline  50 mg Oral Daily  . sodium chloride  3 mL Intravenous Q12H  . sodium chloride   Intravenous Q12H  . traZODone  50 mg Oral QHS  . vancomycin  250 mg Oral Q6H  . Warfarin - Pharmacist Dosing Inpatient   Does not apply Q24H    Assessment/Plan: Congestive heart failure: Patient continues to diuresis well on low dose oral furosemide. Weight has declined below his recent baseline, and he is likely near or at his dry weight.  Renal function remains normal.  Pro BNP not reassessed since initial value in excess of 14,000 obtained-a repeat measurement will be obtained.  Repeat chest x-ray pending.  Atrial fibrillation: Heart rate adequately controlled ; Will change to long-acting diltiazem.  Anticoagulation: INR is stable and therapeutic;  continue warfarin management per pharmacy.   Possible pneumonia: Antibiotics discontinued; no fever nor leukocytosis. Repeat chest x-ray pending.  LOS: 5 days   [place] Bing 08/12/2012, 5:48 PM

## 2012-08-12 NOTE — Progress Notes (Signed)
NAME:  Charles [name], Charles [name]             ACCOUNT NO.:  [id number]  MEDICAL RECORD NO.:  [id number]  LOCATION:  A223                          FACILITY:  [facility]  PHYSICIAN:  Angeletta Goelz G. Renard Matter, MD   DATE OF BIRTH:  1941/02/08  DATE OF PROCEDURE: DATE OF DISCHARGE:                                PROGRESS NOTE   SUBJECTIVE:  This patient has had a fairly comfortable night.  He had been treated for atrial fibrillation with rapid ventricular rate.  He is being transitioned to p.o. Cardizem.  His rate control is better, pulse rate 99.  He does have acute systolic congestive heart failure with pleural effusions and a low ejection fraction of 25%-30% with elevated proBNP.  Is being diuresed.  He remains on vancomycin and Maxipime.  He does have diabetes with lymphoma and previous septic knee.  OBJECTIVE:  VITAL SIGNS:  BP 127/71, respirations 22, pulse 99, temp 98.1. HEENT:  Negative. NECK:  Supple.  No JVD or thyroid abnormalities. HEART:  Irregular rhythm. LUNGS:  Occasional rhonchus heard over left lower lung field. ABDOMEN:  No palpable organs or masses. EXTREMITIES:  Free of edema.  ASSESSMENT:  The patient has had atrial fibrillation with rapid ventricular rate, more controlled now.  He does have congestive heart failure with a low ejection fraction of 30%, systolic heart failure.  He is diuresing and has improved.  Has possible pneumonia, lymphoma, history of septic knee.  He did have hypokalemia and did have runs of KCl.  We will have a repeat BMET today.     Mechel Haggard G. Renard Matter, MD     AGM/MEDQ  D:  08/12/2012  T:  08/12/2012  Job:  [id number]

## 2012-08-13 LAB — PRO B NATRIURETIC PEPTIDE: Pro B Natriuretic peptide (BNP): 2442 pg/mL — ABNORMAL HIGH (ref 0–125)

## 2012-08-13 LAB — PROTIME-INR
INR: 2.43 — ABNORMAL HIGH (ref 0.00–1.49)
Prothrombin Time: 25.3 seconds — ABNORMAL HIGH (ref 11.6–15.2)

## 2012-08-13 LAB — BASIC METABOLIC PANEL
Calcium: 7.8 mg/dL — ABNORMAL LOW (ref 8.4–10.5)
Creatinine, Ser: 0.7 mg/dL (ref 0.50–1.35)
GFR calc non Af Amer: >90 mL/min (ref >90)
Sodium: 140 mEq/L (ref 135–145)

## 2012-08-13 LAB — GLUCOSE, CAPILLARY: Glucose-Capillary: 102 mg/dL — ABNORMAL HIGH (ref 70–99)

## 2012-08-13 MED ORDER — SERTRALINE HCL 50 MG PO TABS: 50.0000 mg | ORAL_TABLET | Freq: Every day | ORAL | Status: DC

## 2012-08-13 MED ORDER — HYDROCODONE-ACETAMINOPHEN 5-325 MG PO TABS: 1.0000 | ORAL_TABLET | ORAL | Status: DC | PRN

## 2012-08-13 MED ORDER — WARFARIN SODIUM 5 MG PO TABS
5.0000 mg | ORAL_TABLET | Freq: Once | ORAL | Status: AC
  Administered 2012-08-13: 5 mg via ORAL
  Filled 2012-08-13: qty 1

## 2012-08-13 MED ORDER — LISINOPRIL 5 MG PO TABS: 5.0000 mg | ORAL_TABLET | Freq: Every day | ORAL | Status: DC

## 2012-08-13 MED ORDER — ATENOLOL 50 MG PO TABS: 50.0000 mg | ORAL_TABLET | Freq: Every day | ORAL | Status: DC

## 2012-08-13 MED ORDER — ACETAMINOPHEN 500 MG PO TABS
500.0000 mg | ORAL_TABLET | ORAL | Status: DC | PRN
  Administered 2012-08-13 (×2): 500 mg via ORAL
  Filled 2012-08-13 (×2): qty 1

## 2012-08-13 MED ORDER — DILTIAZEM HCL ER COATED BEADS 360 MG PO CP24: 360.0000 mg | ORAL_CAPSULE | Freq: Every day | ORAL | Status: DC

## 2012-08-13 MED ORDER — POTASSIUM CHLORIDE CRYS ER 20 MEQ PO TBCR: 20.0000 meq | EXTENDED_RELEASE_TABLET | Freq: Every day | ORAL | Status: DC

## 2012-08-13 MED ORDER — FUROSEMIDE 20 MG PO TABS: 60.0000 mg | ORAL_TABLET | Freq: Every day | ORAL | Status: DC

## 2012-08-13 MED ORDER — ATENOLOL 25 MG PO TABS
50.0000 mg | ORAL_TABLET | Freq: Every day | ORAL | Status: DC
  Administered 2012-08-13: 50 mg via ORAL
  Filled 2012-08-13: qty 2

## 2012-08-13 NOTE — Discharge Summary (Signed)
NAME:  Charles [name], Charles [name]             ACCOUNT NO.:  [id number]  MEDICAL RECORD NO.:  [id number]  LOCATION:  A223                          FACILITY:  [facility]  PHYSICIAN:  Zorina Mallin G. Renard Matter, MD   DATE OF BIRTH:  Oct 08, 1940  DATE OF ADMISSION:  08/07/2012 DATE OF DISCHARGE:  LH                              DISCHARGE SUMMARY   ADDENDUM:  LABORATORY DATA:  The patient's most recent prothrombin time 2.37.  C. diff positive in stool.  RADIOLOGY:  Chest x-ray on admission showed persistent bilateral airspace process with slight improved aeration on the left.  Subsequent x-ray; no acute abnormalities, bibasilar opacities, greater in left seen on previous exam, improved.  HOSPITAL COURSE:  The patient initially was felt to be in congestive heart failure.  He was started on nasal oxygen and IV Lasix 60 mg daily.  He was continued on following medications, Cardizem CD 360 mg daily, sertraline 50 mg daily, Desyrel 50 mg daily, and warfarin dosage determined by pharmacy daily basis.  He was given p.r.n. Zofran for nausea and Xanax 0.5 mg as needed.  He was also given Prinivil 5 mg daily.  Because of atrial fibrillation and rapid ventricular rate, he was initially given IV Cardizem.  This was subsequently transitioned to p.o. Cardizem CD 360 mg daily.  The patient began to diurese and lost approximately 22 pounds with diuresing.  Also, developed diarrhea intermittently.  This was tested positive for C. diff.  He was started on p.o. vancomycin 250 mg every 6 hours.  The diarrhea did improve.  He progressively improved.  His breathing became better.  He was seen both by Cardiology and Pulmonology.  They agree with the current treatment. The patient was in a nursing facility but the family desired to take the patient home on this occasion.  This will be accomplished today.  We will repeat BMET first.     Ineze Serrao G. Renard Matter, MD     AGM/MEDQ  D:  08/13/2012  T:  08/13/2012  Job:  [id number]

## 2012-08-13 NOTE — Discharge Summary (Signed)
NAME:  Charles [name], Charles [name]             ACCOUNT NO.:  [id number]  MEDICAL RECORD NO.:  [id number]  LOCATION:  A340                          FACILITY:  [facility]  PHYSICIAN:  Ritchard Paragas G. Renard Matter, MD   DATE OF BIRTH:  07-20-1940  DATE OF ADMISSION:  08/07/2012 DATE OF DISCHARGE:  05/23/2014LH                              DISCHARGE SUMMARY   This patient, this morning, had short runs of nonsustained ventricular tachycardia.  He was seen by Cardiology who recommended addition of atenolol 50 mg daily.  This will be continued.  They felt the patient could be discharged as well.  The patient will be discharged on the following medications; furosemide 60 mg daily, Zoloft 50 mg daily, Cardizem CD 360 mg daily, Prinivil 5 mg daily, potassium chloride 20 mEq daily, Tenormin 50 mg daily.  He will also be on the following medications; Mevacor 10 mg daily, ProAir HFA 1 puff every 6 hours as needed, Prilosec 20 mg daily, multivitamin with iron 1 daily, Prolia 60 mg injectable every 6 months, Neulasta 6 mg/0.6 mg injection every 14 days, total 4 doses, Tylenol 325 mg every 6 hours, Coreg 25 mg b.i.d., Neupogen 480 mcg 1.6 mL daily at 6 p.m., Mucinex 600 mg 2 tabs b.i.d., hydrocodone/acetaminophen 5/325 every 4 hours as needed, warfarin 5 mg daily, Flomax 0.4 mg daily, tramadol 50 mg at bedtime as needed for pain.  The patient was stable.     Jannat Rosemeyer G. Renard Matter, MD     AGM/MEDQ  D:  08/13/2012  T:  08/13/2012  Job:  [id number]

## 2012-08-13 NOTE — Progress Notes (Signed)
  Charles [name]  72 y.o.  male  Subjective: Patient doing very well symptomatically, but heart rate increases to 180 with activity representing 2:1 AV block in atrial flutter with a development of a rate related IVCD  Allergy: Statins  Objective: Vital signs in last 24 hours: Temp:  [96 F (35.6 C)-98 F (36.7 C)] 96 F (35.6 C) (05/23 1047) Pulse Rate:  [63-109] 63 (05/23 1047) Resp:  [18-20] 20 (05/23 1047) BP: (115-137)/(53-91) 129/91 mmHg (05/23 1047) SpO2:  [97 %-100 %] 98 % (05/23 1047) Weight:  [84.777 kg (186 lb 14.4 oz)] 84.777 kg (186 lb 14.4 oz) (05/23 0412)  84.777 kg (186 lb 14.4 oz) Body mass index is 29.27 kg/(m^2).  Weight change: -1.123 kg (-2 lb 7.6 oz) Last BM Date: 08/12/12  Intake/Output from previous day: 05/22 0701 - 05/23 0700 In: 600 [P.O.:600] Out: 3425 [Urine:3425] Patient continues to diuresis well despite progressive decrease in his dose of furosemide.  General- Well developed; no acute distress  Neck- No JVD, no carotid bruits Lungs- clear lung fields; normal I:E ratio Cardiovascular- normal PMI; normal S1 and S2; irregular rhythm Abdomen- normal bowel sounds; soft and non-tender without masses or organomegaly Skin- Warm, no significant lesions Extremities- Nl distal pulses;  1+ edema-markedly improved  BMET:  Recent Labs  08/11/12 0441 08/13/12 0455  NA 143 140  K 3.5 4.5  CL 101 103  CO2 30 26  GLUCOSE 110* 103*  BUN 11 10  CREATININE 0.75 0.70  CALCIUM 7.3* 7.8*   Imaging Studies/Results: Dg Chest 2 View  08/12/2012   *RADIOLOGY REPORT*  Clinical Data: CHF, history hypertension, nonischemic cardiomyopathy, follicular lymphoma, smoking  CHEST - 2 VIEW  Comparison: 08/09/2012  Findings: Left subclavian Port-A-Cath, tip projecting over SVC. Upper-normal size of cardiac silhouette. Tortuous aorta. Pulmonary vascularity normal. Lungs grossly clear. No pleural effusion or pneumothorax. Right shoulder joint replacement.  IMPRESSION: No  acute abnormalities. Bibasilar opacities right greater than left seen on the previous exam improved.   Original Report Authenticated By: Ulyses Southward, M.D.   Imaging: Imaging results have been reviewed  Medications: I have reviewed the patient's current medications.  Assessment/Plan: Atrial flutter: As is often the case, with slight adrenergic stimulation, conduction switches to 2:1 with a dramatic increase in ventricular response. Atenolol will be added at low dose. Titration can be undertaken as an outpatient.  Congestive heart failure: Massive diuresis continues unabated with marked improvement. Radiographic clearing of pulmonary edema.  Marked decrease in BNP level from 14,000+-> 2400.   LOS: 6 days   [place] Bing 08/13/2012, 2:29 PM

## 2012-08-13 NOTE — Progress Notes (Signed)
Patient discharged home with family.  Port deaccessed - WNL.  Instructed on follow up appointments and new medications.  Also educated on Heart Failure home care, daily weights, and diet.  Patient verbalizes understanding.  Handouts given.  Pt helped to dress in home clothing.  Pt has no questions or complaints at this time.  Stable to DC home.  MD to fax new prescriptions to [facility] and patient to pick up.

## 2012-08-13 NOTE — Progress Notes (Signed)
He is apparently being discharged today. I will plan to sign off. He will need to have chest x-ray to document clearing of his pneumonia.

## 2012-08-13 NOTE — Progress Notes (Signed)
Nurse alerted by central tele that patient was tachy at 150.  Upon checking patient - patient states he feels weak  VSS with pulse 110 at this time.  Neuro check WNL.  Pt denies CP and SOB.  States he just feels weak all over and that he's "been hit by a truck".  Pt now up to chair to eat breakfast. Will continue to monitor

## 2012-08-13 NOTE — Progress Notes (Signed)
Pt having several runs of vtach on tele.  Some appear to be true, clear 5-7 beat runs.  Some appear to be artifact, though.  Patients tachy to 180 as well at times, but only seems to be when the patient is getting up, using the BR, etc.  When at rest, I receive no alerts from central tele.  PO cardizem given this AM.  VSS, other than fluctuating pulse.  Cardio paged.

## 2012-08-13 NOTE — Progress Notes (Signed)
ANTICOAGULATION CONSULT NOTE   Pharmacy Consult for Warfarin Indication: atrial fibrillation  Allergies  Allergen Reactions  . Statins     Intolerant secondary to severe myalgias   Patient Measurements: Height: 5\' 7"  (170.2 cm) Weight: 186 lb 14.4 oz (84.777 kg) IBW/kg (Calculated) : 66.1  Vital Signs: Temp: 98 F (36.7 C) (05/23 0412) Temp src: Oral (05/23 0412) BP: 124/68 mmHg (05/23 0754) Pulse Rate: 109 (05/23 0754)  Labs:  Recent Labs  08/11/12 0441 08/12/12 0443 08/13/12 0455  HGB 10.6*  --   --   HCT 33.3*  --   --   PLT 365  --   --   LABPROT 25.7* 24.8* 25.3*  INR 2.48* 2.37* 2.43*  CREATININE 0.75  --  0.70   Estimated Creatinine Clearance: 85.6 ml/min (by C-G formula based on Cr of 0.7).  Medical History: Past Medical History  Diagnosis Date  . Hypertension   . Atrial fibrillation 10/2008  . Nonischemic cardiomyopathy 02/2009    Presented with CHF; EF of 25-30%; Nonobstructive ASCVD-worst lesion 40% on coronary angiography  . Peptic ulcer disease   . Degenerative joint disease     Knees and shoulders  . Pneumonia 2010  . Low TSH level     Borderline  . Allergic rhinitis   . Anxiety   . Drug abuse   . Tobacco abuse     50 pack years  . 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 Basal cell carcinoma    Medications:  Scheduled:  . diltiazem  360 mg Oral Daily  . feeding supplement  237 mL Oral BID BM  . furosemide  60 mg Oral Daily  . insulin aspart  0-9 Units Subcutaneous Q4H  . lisinopril  5 mg Oral Daily  . Living Better with Heart Failure Book   Does not apply Once  . potassium chloride  20 mEq Oral Daily  . sertraline  50 mg Oral Daily  . sodium chloride   Intravenous Q12H  . traZODone  50 mg Oral QHS  . vancomycin  250 mg Oral Q6H  . warfarin  5 mg Oral Once  . Warfarin - Pharmacist Dosing Inpatient   Does not apply Q24H   Assessment: Patient on chronic Warfarin 5 mg po daily for Afib.   INR has been therapeutic this admission.  Aspirin  has been d/c'd per MD.  No bleeding noted.  Goal of Therapy:  INR 2-3   Plan:  Coumadin 5 mg po today x 1 INR/PT daily  [place] Toure A 08/13/2012,10:20 AM

## 2012-08-13 NOTE — Discharge Summary (Signed)
NAME:  Charles [name], Charles [name]             ACCOUNT NO.:  [id number]  MEDICAL RECORD NO.:  [id number]  LOCATION:                                 FACILITY:  PHYSICIAN:  Genesys Coggeshall G. Renard Matter, MD   DATE OF BIRTH:  1940/06/03  DATE OF ADMISSION:  08/07/2012 DATE OF DISCHARGE:  LH                              DISCHARGE SUMMARY   This patient resided in local nursing home, developed increasing dyspnea prior to admission.  He had been previously discharged from [facility].  Had been treated for hospital-acquired pneumonia while here. He also had progressive swelling of his legs and was saturated in the 70s.  He came to the emergency room in this condition and noted to have markedly elevated BNP.  Chest x-ray was suggestive of heart failure.  He required BiPAP for respiratory support in the emergency department.  He was noted to have an ejection fraction of about 20%, previously. Subsequent, transthoracic echo showed ejection fraction greater than 50%.  No wall motion abnormality.  He was started on intravenous Lasix and admitted.  Atrial fibrillation with rapid ventricular rate, acute-on-chronic respiratory failure, acute diastolic congestive heart failure with normal ejection fraction, follicular lymphoma, non-insulin-dependent diabetes, hyperglycemia, health-care associated pneumonia, C. difficile colitis.  PHYSICAL EXAMINATION:  VITAL SIGNS:  Uncomfortable male with blood pressure 101/63, pulse 97, temperature 98.6. GENERAL:  The patient was lethargic. HEENT:  Negative.  PERRLA. HEART:  Irregular rhythm with rapid rate. LUNGS:  Tachypnea with bibasilar rales. ABDOMEN:  No palpable organs or masses.  No organomegaly. EXTREMITIES:  Edema 3++.  LABORATORY DATA:  Chemistries on admission; sodium 138, potassium 3.9, chloride 108, CO2 of 22, glucose 195, BUN 22, creatinine 0.79, calcium 7.8.  AST 27, ALT 20, alkaline phosphatase 182, bilirubin 0.2,  protein 6.5, albumin 2.5.  CBC:  WBC  20,100, hemoglobin 9.4, hematocrit 29.1.   DICTATION ENDS HERE.     Aigner Horseman G. Renard Matter, MD     AGM/MEDQ  D:  08/13/2012  T:  08/13/2012  Job:  [id number]

## 2012-08-17 ENCOUNTER — Telehealth: Payer: Self-pay | Admitting: *Deleted

## 2012-08-17 ENCOUNTER — Ambulatory Visit: Payer: Medicare Other | Admitting: Cardiology

## 2012-08-17 NOTE — Telephone Encounter (Signed)
[facility] in for home health orders given for INR check on Friday with next snv 08/20/2012.

## 2012-08-18 ENCOUNTER — Ambulatory Visit (HOSPITAL_COMMUNITY): Payer: Medicare Other | Admitting: Oncology

## 2012-08-19 ENCOUNTER — Telehealth: Payer: Self-pay | Admitting: *Deleted

## 2012-08-19 ENCOUNTER — Ambulatory Visit (INDEPENDENT_AMBULATORY_CARE_PROVIDER_SITE_OTHER): Payer: Medicare Other | Admitting: Cardiology

## 2012-08-19 DIAGNOSIS — I4891 Unspecified atrial fibrillation: Secondary | ICD-10-CM

## 2012-08-19 DIAGNOSIS — Z7901 Long term (current) use of anticoagulants: Secondary | ICD-10-CM

## 2012-08-19 LAB — POCT INR: INR: 3.4

## 2012-08-19 NOTE — Telephone Encounter (Signed)
INR 3.4

## 2012-08-23 ENCOUNTER — Telehealth: Payer: Self-pay | Admitting: *Deleted

## 2012-08-23 ENCOUNTER — Encounter (HOSPITAL_COMMUNITY): Payer: Medicare Other | Attending: Oncology

## 2012-08-23 ENCOUNTER — Ambulatory Visit (INDEPENDENT_AMBULATORY_CARE_PROVIDER_SITE_OTHER): Payer: Medicare Other | Admitting: *Deleted

## 2012-08-23 DIAGNOSIS — J189 Pneumonia, unspecified organism: Secondary | ICD-10-CM | POA: Insufficient documentation

## 2012-08-23 DIAGNOSIS — C8290 Follicular lymphoma, unspecified, unspecified site: Secondary | ICD-10-CM

## 2012-08-23 DIAGNOSIS — C8299 Follicular lymphoma, unspecified, extranodal and solid organ sites: Secondary | ICD-10-CM | POA: Insufficient documentation

## 2012-08-23 DIAGNOSIS — Z7901 Long term (current) use of anticoagulants: Secondary | ICD-10-CM

## 2012-08-23 DIAGNOSIS — D709 Neutropenia, unspecified: Secondary | ICD-10-CM | POA: Insufficient documentation

## 2012-08-23 DIAGNOSIS — I4891 Unspecified atrial fibrillation: Secondary | ICD-10-CM

## 2012-08-23 LAB — CBC WITH DIFFERENTIAL/PLATELET
Basophils Relative: 2 % — ABNORMAL HIGH (ref 0–1)
Eosinophils Absolute: 0.4 10*3/uL (ref 0.0–0.7)
Eosinophils Relative: 5 % (ref 0–5)
Hemoglobin: 9.4 g/dL — ABNORMAL LOW (ref 13.0–17.0)
MCH: 28.1 pg (ref 26.0–34.0)
MCHC: 32 g/dL (ref 30.0–36.0)
Monocytes Absolute: 0.8 10*3/uL (ref 0.1–1.0)
Monocytes Relative: 11 % (ref 3–12)
Neutrophils Relative %: 70 % (ref 43–77)

## 2012-08-23 LAB — COMPREHENSIVE METABOLIC PANEL
ALT: 11 U/L (ref 0–53)
AST: 13 U/L (ref 0–37)
Alkaline Phosphatase: 123 U/L — ABNORMAL HIGH (ref 39–117)
GFR calc Af Amer: 57 mL/min — ABNORMAL LOW (ref >90)
GFR calc non Af Amer: 50 mL/min — ABNORMAL LOW (ref >90)
Glucose, Bld: 99 mg/dL (ref 70–99)
Potassium: 4.9 mEq/L (ref 3.5–5.1)
Total Bilirubin: 0.5 mg/dL (ref 0.3–1.2)

## 2012-08-23 NOTE — Telephone Encounter (Signed)
PT INR 2.5 BY [facility]

## 2012-08-23 NOTE — Progress Notes (Signed)
Labs drawn today for cbc/diff,cmp 

## 2012-08-24 ENCOUNTER — Encounter (HOSPITAL_BASED_OUTPATIENT_CLINIC_OR_DEPARTMENT_OTHER): Payer: Medicare Other

## 2012-08-24 VITALS — BP 88/58 | HR 76 | Temp 97.5°F | Resp 18

## 2012-08-24 DIAGNOSIS — M899 Disorder of bone, unspecified: Secondary | ICD-10-CM

## 2012-08-24 DIAGNOSIS — D709 Neutropenia, unspecified: Secondary | ICD-10-CM

## 2012-08-24 DIAGNOSIS — J189 Pneumonia, unspecified organism: Secondary | ICD-10-CM

## 2012-08-24 MED ORDER — HEPARIN SOD (PORK) LOCK FLUSH 100 UNIT/ML IV SOLN
250.0000 [IU] | Freq: Once | INTRAVENOUS | Status: AC | PRN
  Administered 2012-08-24: 500 [IU]
  Filled 2012-08-24: qty 5

## 2012-08-24 MED ORDER — HEPARIN SOD (PORK) LOCK FLUSH 100 UNIT/ML IV SOLN
INTRAVENOUS | Status: AC
  Filled 2012-08-24: qty 5

## 2012-08-24 MED ORDER — ZOLEDRONIC ACID 4 MG/5ML IV CONC
4.0000 mg | Freq: Once | INTRAVENOUS | Status: AC
  Administered 2012-08-24: 4 mg via INTRAVENOUS
  Filled 2012-08-24: qty 5

## 2012-08-24 MED ORDER — SODIUM CHLORIDE 0.9 % IJ SOLN
10.0000 mL | INTRAMUSCULAR | Status: DC | PRN
  Administered 2012-08-24: 10 mL
  Filled 2012-08-24: qty 10

## 2012-08-24 MED ORDER — SODIUM CHLORIDE 0.9 % IV SOLN
Freq: Once | INTRAVENOUS | Status: AC
  Administered 2012-08-24: 11:00:00 via INTRAVENOUS

## 2012-08-24 NOTE — Progress Notes (Signed)
Tolerated Zometa infusion well. 

## 2012-08-27 ENCOUNTER — Encounter: Payer: Medicare Other | Admitting: Cardiology

## 2012-09-01 ENCOUNTER — Ambulatory Visit (INDEPENDENT_AMBULATORY_CARE_PROVIDER_SITE_OTHER): Payer: Medicare Other | Admitting: *Deleted

## 2012-09-01 DIAGNOSIS — I4891 Unspecified atrial fibrillation: Secondary | ICD-10-CM

## 2012-09-01 DIAGNOSIS — Z7901 Long term (current) use of anticoagulants: Secondary | ICD-10-CM

## 2012-09-07 ENCOUNTER — Ambulatory Visit (INDEPENDENT_AMBULATORY_CARE_PROVIDER_SITE_OTHER): Payer: Medicare Other | Admitting: Orthopedic Surgery

## 2012-09-07 ENCOUNTER — Encounter: Payer: Self-pay | Admitting: Orthopedic Surgery

## 2012-09-07 VITALS — BP 106/70 | Ht 67.0 in | Wt 189.0 lb

## 2012-09-07 DIAGNOSIS — M549 Dorsalgia, unspecified: Secondary | ICD-10-CM

## 2012-09-07 DIAGNOSIS — M1120 Other chondrocalcinosis, unspecified site: Secondary | ICD-10-CM

## 2012-09-07 MED ORDER — METHOCARBAMOL 500 MG PO TABS: 500.0000 mg | ORAL_TABLET | Freq: Four times a day (QID) | ORAL | Status: DC | PRN

## 2012-09-07 NOTE — Patient Instructions (Addendum)
Back pain   Left knee arthritis

## 2012-09-07 NOTE — Progress Notes (Signed)
Patient ID: Charles [name], male   DOB: 10/09/1940, 72 y.o.   MRN: [id number] Chief Complaint  Patient presents with  . Knee Pain    Left knee pain and swelling    This is a followup visit from a patient that I saw in the hospital who has a arthritic knee post patellectomy and he has CP PD disease  As she says his knee feels fine but is complaining of right hip pain. No crit previous history of back or hip problems. His been on a walker doing well with a hinged knee brace since hospital visit  His right hip pain is really over his right lumbar area and is improved slightly with a heating pad. Not associated with numbness or tingling  Past Medical History  Diagnosis Date  . Hypertension   . Atrial fibrillation 10/2008  . Nonischemic cardiomyopathy 02/2009    Presented with CHF; EF of 25-30%; Nonobstructive ASCVD-worst lesion 40% on coronary angiography  . Peptic ulcer disease   . Degenerative joint disease     Knees and shoulders  . Pneumonia 2010  . Low TSH level     Borderline  . Allergic rhinitis   . Anxiety   . Drug abuse   . Tobacco abuse     50 pack years  . 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 Basal cell carcinoma     Past Surgical History  Procedure Laterality Date  . Neck lesion biopsy  June 24, 2010    Follicular lymphoma  . Skin cancer excision      under left breast; variously described as melanoma and basal cell  . Rotator cuff repair      right  . Patella fracture surgery  1975  . Hematoma evacuation      after melanoma removal  . Portacath placement  07/12/2010  . Colonoscopy  2009    Also underwent an EGD; patient reports no significant findings  . Bone marrow aspiration  06/2012  . Bone marrow biopsy  06/2012  . Fungal infection      Left knee transverse incision nontender no swelling range of motion approximately 105 knee stable. Also tone is normal.  Right hip range of motion is normal. 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

## 2012-09-08 ENCOUNTER — Telehealth (HOSPITAL_COMMUNITY): Payer: Self-pay

## 2012-09-08 ENCOUNTER — Other Ambulatory Visit (HOSPITAL_COMMUNITY): Payer: Self-pay | Admitting: Oncology

## 2012-09-08 DIAGNOSIS — B379 Candidiasis, unspecified: Secondary | ICD-10-CM

## 2012-09-08 MED ORDER — NYSTATIN 100000 UNIT/GM EX POWD: Freq: Four times a day (QID) | CUTANEOUS | Status: DC

## 2012-09-08 NOTE — Telephone Encounter (Signed)
Call back from patient - requested refill for Nystatin powder but is already on pill prescribed by Dr. [name].  States "I have a little breaking out in my groin area - not as bad as before."  Discussed with PA and refill for nystatin to be done by PA.  Patient notified.

## 2012-09-08 NOTE — Telephone Encounter (Signed)
Left message for patient to call back regarding refill requests received for fluconazole and Nystatin powder.Charles [name]

## 2012-09-10 ENCOUNTER — Ambulatory Visit (INDEPENDENT_AMBULATORY_CARE_PROVIDER_SITE_OTHER): Payer: Medicare Other | Admitting: *Deleted

## 2012-09-10 DIAGNOSIS — I4891 Unspecified atrial fibrillation: Secondary | ICD-10-CM

## 2012-09-10 DIAGNOSIS — Z7901 Long term (current) use of anticoagulants: Secondary | ICD-10-CM

## 2012-09-20 ENCOUNTER — Encounter: Payer: Self-pay | Admitting: Adult Health

## 2012-09-20 ENCOUNTER — Ambulatory Visit (INDEPENDENT_AMBULATORY_CARE_PROVIDER_SITE_OTHER): Payer: Medicare Other | Admitting: Adult Health

## 2012-09-20 VITALS — BP 120/66 | HR 94 | Ht 67.0 in | Wt 194.0 lb

## 2012-09-20 DIAGNOSIS — G4700 Insomnia, unspecified: Secondary | ICD-10-CM

## 2012-09-20 DIAGNOSIS — I509 Heart failure, unspecified: Secondary | ICD-10-CM

## 2012-09-20 DIAGNOSIS — I4891 Unspecified atrial fibrillation: Secondary | ICD-10-CM

## 2012-09-20 DIAGNOSIS — I10 Essential (primary) hypertension: Secondary | ICD-10-CM

## 2012-09-20 NOTE — Assessment & Plan Note (Signed)
Consider sleep study for evaluation for OSA.

## 2012-09-20 NOTE — Patient Instructions (Addendum)
Your physician recommends that you schedule a follow-up appointment in: 3 months. Your physician recommends that you continue on your current medications as directed. Please refer to the Current Medication list given to you today. 

## 2012-09-20 NOTE — Progress Notes (Signed)
HPI: [name] is a 72 year old patient of Dr. Dietrich [name] we are following for ongoing assessment and management of atrial fibrillation, chronic anticoagulation, hypertension, and hyperlipidemia. The patient was last seen by Dr. Dietrich [name] in January 2014. That time he was recovering from pneumonia. The patient ventricular rate was slightly elevated and.Tiazac dosage was increased to 30 mg daily Cipro was discontinued in the setting of hypotension with increased dose of calcium channel blocker.   Subsequently the patient in 08/07/2012 was admitted for short runs of nonsustained ventricular tachycardia, atenolol 50 mg daily was added to his medical regimen. He was continued on furosemide Zoloft Cardizem CD 360, ago was reinstituted.      Since being in the hospital diltiazem was decreased to 180 mg daily, and decreased atenolol to 25 mg daily. He was feeling tired and dizzy. Since that time he is feeling better.   Allergies  Allergen Reactions  . Statins     Intolerant secondary to severe myalgias    Current Outpatient Prescriptions  Medication Sig Dispense Refill  . acetaminophen (TYLENOL) 325 MG tablet Take 2 tablets (650 mg total) by mouth every 6 (six) hours as needed for fever.      Marland Kitchen atenolol (TENORMIN) 50 MG tablet Take 25 mg by mouth daily.      . calcium citrate (CALCITRATE - DOSED IN MG ELEMENTAL CALCIUM) 950 MG tablet Take 1 tablet by mouth 2 (two) times daily.      Marland Kitchen diltiazem (CARDIZEM CD) 180 MG 24 hr capsule Take 180 mg by mouth daily.      . furosemide (LASIX) 20 MG tablet Take 3 tablets (60 mg total) by mouth daily.  30 tablet  5  . guaiFENesin (MUCINEX) 600 MG 12 hr tablet Take 2 tablets (1,200 mg total) by mouth 2 (two) times daily.      Marland Kitchen HYDROcodone-acetaminophen (NORCO/VICODIN) 5-325 MG per tablet Take 1 tablet by mouth every 4 (four) hours as needed.  30 tablet  0  . lovastatin (MEVACOR) 10 MG tablet Take 10 mg by mouth daily.       . methocarbamol (ROBAXIN) 500 MG tablet  Take 1 tablet (500 mg total) by mouth every 6 (six) hours as needed.  60 tablet  0  . nystatin (MYCOSTATIN) powder Apply topically 4 (four) times daily.  15 g  0  . omeprazole (PRILOSEC) 20 MG capsule Take 20 mg by mouth daily.      . potassium chloride SA (K-DUR,KLOR-CON) 20 MEQ tablet Take 1 tablet (20 mEq total) by mouth daily.  60 tablet  5  . PROAIR HFA 108 (90 BASE) MCG/ACT inhaler Inhale 1 puff into the lungs every 6 (six) hours as needed. For shortness of breath      . sertraline (ZOLOFT) 50 MG tablet Take 1 tablet (50 mg total) by mouth daily.  30 tablet  5  . tamsulosin (FLOMAX) 0.4 MG CAPS Take 0.4 mg by mouth daily.      . traMADol (ULTRAM) 50 MG tablet Take 50 mg by mouth at bedtime. For pain      . warfarin (COUMADIN) 5 MG tablet Take 5 mg by mouth every evening.      . carvedilol (COREG) 25 MG tablet Take 1 tablet (25 mg total) by mouth 2 (two) times daily with a meal.       No current facility-administered medications for this visit.   Facility-Administered Medications Ordered in Other Visits  Medication Dose Route Frequency Provider Last Rate Last Dose  .  sodium chloride 0.9 % injection 10 mL  10 mL Intracatheter PRN Randall An, MD   10 mL at 10/15/10 1230    Past Medical History  Diagnosis Date  . Hypertension   . Atrial fibrillation 10/2008  . Nonischemic cardiomyopathy 02/2009    Presented with CHF; EF of 25-30%; Nonobstructive ASCVD-worst lesion 40% on coronary angiography  . Peptic ulcer disease   . Degenerative joint disease     Knees and shoulders  . Pneumonia 2010  . Low TSH level     Borderline  . Allergic rhinitis   . Anxiety   . Drug abuse   . Tobacco abuse     50 pack years  . 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 Basal cell carcinoma     Past Surgical History  Procedure Laterality Date  . Neck lesion biopsy  June 24, 2010    Follicular lymphoma  . Skin cancer excision      under left breast; variously described as melanoma and basal cell  .  Rotator cuff repair      right  . Patella fracture surgery  1975  . Hematoma evacuation      after melanoma removal  . Portacath placement  07/12/2010  . Colonoscopy  2009    Also underwent an EGD; patient reports no significant findings  . Bone marrow aspiration  06/2012  . Bone marrow biopsy  06/2012  . Fungal infection      ROS: Review of systems complete and found to be negative unless listed above   PHYSICAL EXAM BP 120/66  Pulse 94  Ht 5\' 7"  (1.702 m)  Wt 194 lb (87.998 kg)  BMI 30.38 kg/m2  General: Well developed, well nourished, in no acute distress Head: Eyes PERRLA, No xanthomas.   Normal cephalic and atramatic  Lungs:Mild bibasilar crackles without wheezes.  Heart: HRIR S1 S2, without MRG.  Pulses are 2+ & equal.            No carotid bruit. No JVD.  No abdominal bruits. No femoral bruits. Abdomen: Bowel sounds are positive, abdomen soft and non-tender without masses or                  Hernia's noted. Msk:  Back normal, normal gait. Normal strength and tone for age. Extremities: No clubbing, cyanosis or edema.  DP +1 Neuro: Alert and oriented X 3. Psych:  Good affect, responds appropriately  EKG: Atrial fib with occasional PVC's. Rate of 94 bpm.  ASSESSMENT AND PLAN

## 2012-09-20 NOTE — Assessment & Plan Note (Signed)
Rate is essentially controlled. He continues on lower dose of cardizem and tenormin without fatigue or hypotension. Denies heart racing. He is followed by [facility] for INR.

## 2012-09-20 NOTE — Assessment & Plan Note (Signed)
Well controlled presently. No changes in his medications, See him again n 3 months.

## 2012-09-20 NOTE — Assessment & Plan Note (Signed)
No evidence of fluid overload at this time. Continue current medications. Treatment will concern rate control of atrial fib.

## 2012-09-21 ENCOUNTER — Encounter (HOSPITAL_COMMUNITY): Payer: Medicare Other | Attending: Oncology

## 2012-09-21 DIAGNOSIS — D709 Neutropenia, unspecified: Secondary | ICD-10-CM | POA: Insufficient documentation

## 2012-09-21 DIAGNOSIS — C8290 Follicular lymphoma, unspecified, unspecified site: Secondary | ICD-10-CM

## 2012-09-21 DIAGNOSIS — M899 Disorder of bone, unspecified: Secondary | ICD-10-CM

## 2012-09-21 DIAGNOSIS — C8299 Follicular lymphoma, unspecified, extranodal and solid organ sites: Secondary | ICD-10-CM | POA: Insufficient documentation

## 2012-09-21 DIAGNOSIS — D649 Anemia, unspecified: Secondary | ICD-10-CM | POA: Insufficient documentation

## 2012-09-21 DIAGNOSIS — M949 Disorder of cartilage, unspecified: Secondary | ICD-10-CM

## 2012-09-21 DIAGNOSIS — J189 Pneumonia, unspecified organism: Secondary | ICD-10-CM | POA: Insufficient documentation

## 2012-09-21 LAB — COMPREHENSIVE METABOLIC PANEL
ALT: 8 U/L (ref 0–53)
Albumin: 3.3 g/dL — ABNORMAL LOW (ref 3.5–5.2)
Calcium: 8.6 mg/dL (ref 8.4–10.5)
GFR calc Af Amer: >90 mL/min (ref >90)
Glucose, Bld: 119 mg/dL — ABNORMAL HIGH (ref 70–99)
Sodium: 136 mEq/L (ref 135–145)
Total Protein: 7.1 g/dL (ref 6.0–8.3)

## 2012-09-21 LAB — CBC WITH DIFFERENTIAL/PLATELET
Basophils Relative: 2 % — ABNORMAL HIGH (ref 0–1)
Eosinophils Absolute: 0.2 10*3/uL (ref 0.0–0.7)
Eosinophils Relative: 4 % (ref 0–5)
Lymphs Abs: 0.6 10*3/uL — ABNORMAL LOW (ref 0.7–4.0)
MCH: 27.9 pg (ref 26.0–34.0)
MCHC: 32.2 g/dL (ref 30.0–36.0)
MCV: 86.5 fL (ref 78.0–100.0)
Platelets: 186 10*3/uL (ref 150–400)
RDW: 18.2 % — ABNORMAL HIGH (ref 11.5–15.5)

## 2012-09-21 MED ORDER — HEPARIN SOD (PORK) LOCK FLUSH 100 UNIT/ML IV SOLN
INTRAVENOUS | Status: AC
  Filled 2012-09-21: qty 5

## 2012-09-21 MED ORDER — ZOLEDRONIC ACID 4 MG/5ML IV CONC
4.0000 mg | Freq: Once | INTRAVENOUS | Status: AC
  Administered 2012-09-21: 4 mg via INTRAVENOUS
  Filled 2012-09-21: qty 5

## 2012-09-21 MED ORDER — HEPARIN SOD (PORK) LOCK FLUSH 100 UNIT/ML IV SOLN
500.0000 [IU] | Freq: Once | INTRAVENOUS | Status: AC | PRN
  Administered 2012-09-21: 500 [IU]
  Filled 2012-09-21: qty 5

## 2012-09-21 MED ORDER — SODIUM CHLORIDE 0.9 % IJ SOLN
10.0000 mL | INTRAMUSCULAR | Status: DC | PRN
  Filled 2012-09-21: qty 10

## 2012-09-21 MED ORDER — SODIUM CHLORIDE 0.9 % IV SOLN
Freq: Once | INTRAVENOUS | Status: AC
  Administered 2012-09-21: 11:00:00 via INTRAVENOUS

## 2012-09-21 NOTE — Progress Notes (Signed)
Tolerated zometa without problems 

## 2012-09-23 ENCOUNTER — Ambulatory Visit (INDEPENDENT_AMBULATORY_CARE_PROVIDER_SITE_OTHER): Payer: Medicare Other | Admitting: *Deleted

## 2012-09-23 DIAGNOSIS — Z7901 Long term (current) use of anticoagulants: Secondary | ICD-10-CM

## 2012-09-23 DIAGNOSIS — I4891 Unspecified atrial fibrillation: Secondary | ICD-10-CM

## 2012-09-23 LAB — PROTIME-INR: INR: 6.2 — AB (ref 0.9–1.1)

## 2012-09-29 ENCOUNTER — Ambulatory Visit (INDEPENDENT_AMBULATORY_CARE_PROVIDER_SITE_OTHER): Payer: Medicare Other | Admitting: *Deleted

## 2012-09-29 DIAGNOSIS — Z7901 Long term (current) use of anticoagulants: Secondary | ICD-10-CM

## 2012-09-29 DIAGNOSIS — I4891 Unspecified atrial fibrillation: Secondary | ICD-10-CM

## 2012-09-30 ENCOUNTER — Encounter (HOSPITAL_COMMUNITY): Payer: Self-pay | Admitting: Oncology

## 2012-10-01 ENCOUNTER — Other Ambulatory Visit (HOSPITAL_COMMUNITY): Payer: Self-pay | Admitting: Family Medicine

## 2012-10-01 ENCOUNTER — Ambulatory Visit (HOSPITAL_COMMUNITY)
Admission: RE | Admit: 2012-10-01 | Discharge: 2012-10-01 | Disposition: A | Discharge status: Home / Self Care | Payer: Medicare Other | Source: Ambulatory Visit | Attending: Family Medicine | Admitting: Family Medicine

## 2012-10-01 DIAGNOSIS — R05 Cough: Secondary | ICD-10-CM

## 2012-10-01 DIAGNOSIS — I517 Cardiomegaly: Secondary | ICD-10-CM | POA: Insufficient documentation

## 2012-10-01 DIAGNOSIS — J90 Pleural effusion, not elsewhere classified: Secondary | ICD-10-CM | POA: Insufficient documentation

## 2012-10-01 DIAGNOSIS — R059 Cough, unspecified: Secondary | ICD-10-CM | POA: Insufficient documentation

## 2012-10-01 DIAGNOSIS — R0600 Dyspnea, unspecified: Secondary | ICD-10-CM

## 2012-10-01 DIAGNOSIS — R0602 Shortness of breath: Secondary | ICD-10-CM | POA: Insufficient documentation

## 2012-10-04 ENCOUNTER — Ambulatory Visit (INDEPENDENT_AMBULATORY_CARE_PROVIDER_SITE_OTHER): Payer: Medicare Other | Admitting: *Deleted

## 2012-10-04 DIAGNOSIS — I4891 Unspecified atrial fibrillation: Secondary | ICD-10-CM

## 2012-10-04 DIAGNOSIS — Z7901 Long term (current) use of anticoagulants: Secondary | ICD-10-CM

## 2012-10-04 LAB — POCT INR: INR: 3.6

## 2012-10-05 ENCOUNTER — Ambulatory Visit (HOSPITAL_COMMUNITY): Payer: Medicare Other

## 2012-10-06 ENCOUNTER — Encounter (HOSPITAL_BASED_OUTPATIENT_CLINIC_OR_DEPARTMENT_OTHER): Payer: Medicare Other

## 2012-10-06 ENCOUNTER — Encounter (HOSPITAL_COMMUNITY): Payer: Self-pay

## 2012-10-06 VITALS — BP 109/65 | HR 110 | Temp 97.2°F | Resp 16 | Wt 183.6 lb

## 2012-10-06 DIAGNOSIS — C8290 Follicular lymphoma, unspecified, unspecified site: Secondary | ICD-10-CM

## 2012-10-06 DIAGNOSIS — C8299 Follicular lymphoma, unspecified, extranodal and solid organ sites: Secondary | ICD-10-CM

## 2012-10-06 DIAGNOSIS — D649 Anemia, unspecified: Secondary | ICD-10-CM

## 2012-10-06 LAB — CBC WITH DIFFERENTIAL/PLATELET
Basophils Absolute: 0.1 10*3/uL (ref 0.0–0.1)
Basophils Relative: 2 % — ABNORMAL HIGH (ref 0–1)
HCT: 34.5 % — ABNORMAL LOW (ref 39.0–52.0)
Hemoglobin: 11.2 g/dL — ABNORMAL LOW (ref 13.0–17.0)
Lymphocytes Relative: 17 % (ref 12–46)
MCHC: 32.5 g/dL (ref 30.0–36.0)
Monocytes Absolute: 0.5 10*3/uL (ref 0.1–1.0)
Monocytes Relative: 10 % (ref 3–12)
Neutro Abs: 3.3 10*3/uL (ref 1.7–7.7)
Neutrophils Relative %: 65 % (ref 43–77)
RDW: 18 % — ABNORMAL HIGH (ref 11.5–15.5)
WBC: 5.1 10*3/uL (ref 4.0–10.5)

## 2012-10-06 MED ORDER — HEPARIN SOD (PORK) LOCK FLUSH 100 UNIT/ML IV SOLN
500.0000 [IU] | Freq: Once | INTRAVENOUS | Status: AC
  Administered 2012-10-06: 500 [IU] via INTRAVENOUS
  Filled 2012-10-06: qty 5

## 2012-10-06 MED ORDER — SODIUM CHLORIDE 0.9 % IJ SOLN
10.0000 mL | INTRAMUSCULAR | Status: DC | PRN
  Administered 2012-10-06: 10 mL via INTRAVENOUS
  Filled 2012-10-06: qty 10

## 2012-10-06 MED ORDER — HEPARIN SOD (PORK) LOCK FLUSH 100 UNIT/ML IV SOLN
INTRAVENOUS | Status: AC
  Filled 2012-10-06: qty 5

## 2012-10-06 NOTE — Patient Instructions (Addendum)
[facility] Discharge Instructions  RECOMMENDATIONS MADE BY THE CONSULTANT AND ANY TEST RESULTS WILL BE SENT TO YOUR REFERRING PHYSICIAN.  EXAM FINDINGS BY THE PHYSICIAN TODAY AND SIGNS OR SYMPTOMS TO REPORT TO CLINIC OR PRIMARY PHYSICIAN: exam and discussion by Dr. Sharia Reeve.  We will check some iron studies to see if you need any iron.  We will stop the zometa and put you on calcium and vitamin D.  MEDICATIONS PRESCRIBED:  Need to take calcium 600 mg twice daily and Vitamin D 400 IU twice daily.  INSTRUCTIONS GIVEN AND DISCUSSED: Report fevers, night sweats, any new lumps, etc.  SPECIAL INSTRUCTIONS/FOLLOW-UP: Port flush every 6 weeks and follow-up in 3 months.  Thank you for choosing [facility] to provide your oncology and hematology care.  To afford each patient quality time with our providers, please arrive at least 15 minutes before your scheduled appointment time.  With your help, our goal is to use those 15 minutes to complete the necessary work-up to ensure our physicians have the information they need to help with your evaluation and healthcare recommendations.    Effective January 1st, 2014, we ask that you re-schedule your appointment with our physicians should you arrive 10 or more minutes late for your appointment.  We strive to give you quality time with our providers, and arriving late affects you and other patients whose appointments are after yours.    Again, thank you for choosing [facility].  Our hope is that these requests will decrease the amount of time that you wait before being seen by our physicians.       _____________________________________________________________  Should you have questions after your visit to [facility], please contact our office at [phone] between the hours of 8:30 a.m. and 5:00 p.m.  Voicemails left after 4:30 p.m. will not be returned until the following business day.  For  prescription refill requests, have your pharmacy contact our office with your prescription refill request.

## 2012-10-06 NOTE — Progress Notes (Signed)
[facility] [phone]   [phone]  OFFICE PROGRESS NOTE  Charles Obey, MD 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 He was treated very success of chemotherapy with Rituxan and CHOP. His 6th planned cycle was complicated on 11/05/2010.  He continued Rituxan from 02/17/2011 3 monthly I believe till 12/09/2011. Has had a problem with cytopenias in the past.  He has received G-CSF in the past also.  He had a bone marrow aspiration and biopsy done and forth to 14 which showed normal cellular bone marrow with elective upper node erythroid precursors and neutropenia with plasmacytosis.  Flow cytometric did not show any monoclonal B-cell population.  Last PET scan in March 2014 did not show any evidence of lymphoma.  INTERVAL HISTORY:   Charles [name] 72 y.o. male returns to the clinic today for he scheduled 3 month a follow up.  He continues to have problem with ambulation due to his chronic musculoskeletal problems including knee problem and his wife.  Patient was not able to give much history and appeared to have a flat affect.  History was provided by his wife.  His wife does not report any increasing shortness of breath or lymphadenopathy.  Patient is reportedly eating well has not had any night sweats suggestive of lymphoma despite anxiety episodes.  Patient tells me that he is fatigue is improved. There is no reported fever.  Recent episode of pneumonia noted.  Patient also was reportedly seen last week at his PCP's office and chest x-ray had shown my right pleural effusion and patient was instructed to adjust his Lasix.  He also denies chest pain.    MEDICAL HISTORY: Past Medical History  Diagnosis Date  . Hypertension   .  Atrial fibrillation 10/2008  . Nonischemic cardiomyopathy 02/2009    Presented with CHF; EF of 25-30%; Nonobstructive ASCVD-worst lesion 40% on coronary angiography  . Peptic ulcer disease   . Degenerative joint disease     Knees and shoulders  . Pneumonia 2010  . Low TSH level     Borderline  . Allergic rhinitis   . Anxiety   . Drug abuse   . Tobacco abuse     50 pack years  . 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 Basal cell carcinoma   . Bone metastases 09/30/2012    ALLERGIES:  is allergic to statins.  MEDICATIONS:  Current Outpatient Prescriptions  Medication Sig Dispense Refill  . acetaminophen (TYLENOL) 325 MG tablet Take 2 tablets (650 mg total) by mouth every 6 (six) hours as needed for fever.      Marland Kitchen atenolol (TENORMIN) 50 MG tablet Take 25 mg by mouth daily.      . calcium citrate (CALCITRATE - DOSED IN MG ELEMENTAL CALCIUM) 950 MG tablet Take 1 tablet by mouth 2 (two) times daily.      Marland Kitchen diltiazem (CARDIZEM CD) 180 MG 24 hr capsule Take 180 mg by mouth daily.      . furosemide (LASIX) 20 MG tablet Take 3 tablets (60 mg total) by mouth daily.  30 tablet  5  . guaiFENesin (MUCINEX) 600 MG 12 hr tablet Take 600 mg by mouth 2 (two) times daily.      Marland Kitchen HYDROcodone-acetaminophen (NORCO/VICODIN) 5-325 MG per tablet Take 1 tablet by mouth every 4 (  four) hours as needed.  30 tablet  0  . lovastatin (MEVACOR) 10 MG tablet Take 10 mg by mouth daily.       Marland Kitchen omeprazole (PRILOSEC) 20 MG capsule Take 20 mg by mouth daily.      Marland Kitchen PROAIR HFA 108 (90 BASE) MCG/ACT inhaler Inhale 1 puff into the lungs every 6 (six) hours as needed. For shortness of breath      . sertraline (ZOLOFT) 50 MG tablet Take 1 tablet (50 mg total) by mouth daily.  30 tablet  5  . traMADol (ULTRAM) 50 MG tablet Take 50 mg by mouth every 6 (six) hours as needed. For pain      . warfarin (COUMADIN) 5 MG tablet Take 5 mg by mouth every evening.      . methocarbamol (ROBAXIN) 500 MG tablet Take 1 tablet (500 mg total) by  mouth every 6 (six) hours as needed.  60 tablet  0   No current facility-administered medications for this visit.   Facility-Administered Medications Ordered in Other Visits  Medication Dose Route Frequency Provider Last Rate Last Dose  . sodium chloride 0.9 % injection 10 mL  10 mL Intracatheter PRN Randall An, MD   10 mL at 10/15/10 1230    SURGICAL HISTORY:  Past Surgical History  Procedure Laterality Date  . Neck lesion biopsy  June 24, 2010    Follicular lymphoma  . Skin cancer excision      under left breast; variously described as melanoma and basal cell  . Rotator cuff repair      right  . Patella fracture surgery  1975  . Hematoma evacuation      after melanoma removal  . Portacath placement  07/12/2010  . Colonoscopy  2009    Also underwent an EGD; patient reports no significant findings  . Bone marrow aspiration  06/2012  . Bone marrow biopsy  06/2012  . Fungal infection       REVIEW OF SYSTEMS:14 point review of system is as in the history above otherwise negative.   PHYSICAL EXAMINATION:  Blood pressure 109/65, pulse 110, temperature 97.2 F (36.2 C), temperature source Oral, resp. rate 16, weight 183 lb 9.6 oz (83.28 kg). GENERAL: No distress, frail with a flat affect. SKIN:  No rashes or significant lesions . HEAD: Normocephalic, No masses, lesions, tenderness or abnormalities  EYES: Conjunctiva are pink and non-injected  ENT: External ears normal ,lips, buccal mucosa, and tongue normal and mucous membranes are moist  LYMPH: No palpable lymphadenopathy, in the neck supraclavicular areas, axilla and inguinal lymph node areas. BREAST:Normal without mass, skin or nipple changes or axillary nodes,  LUNGS: clear to auscultation , no crackles or wheezes HEART: regular rate & rhythm, no murmurs, no gallops, S1 normal and S2 normal  ABDOMEN: Abdomen soft, non-tender, normal bowel sounds, no masses or organomegaly and no hepatosplenomegaly palpable MSK: Walks  with a walker. EXTREMITIES: No edema, no skin discoloration or tenderness NEURO: alert & oriented , no focal motor/sensory deficits.     LABORATORY DATA: Lab Results  Component Value Date   WBC 5.3 09/21/2012   HGB 9.5* 09/21/2012   HCT 29.5* 09/21/2012   MCV 86.5 09/21/2012   PLT 186 09/21/2012      Chemistry      Component Value Date/Time   NA 136 09/21/2012 1126   K 4.3 09/21/2012 1126   CL 103 09/21/2012 1126   CO2 24 09/21/2012 1126   BUN 17 09/21/2012 1126  CREATININE 0.95 09/21/2012 1126   CREATININE 0.95 02/27/2011 0850      Component Value Date/Time   CALCIUM 8.6 09/21/2012 1126   ALKPHOS 79 09/21/2012 1126   AST 14 09/21/2012 1126   ALT 8 09/21/2012 1126   BILITOT 0.5 09/21/2012 1126       RADIOGRAPHIC STUDIES: Dg Chest 2 View  10/01/2012   *RADIOLOGY REPORT*  Clinical Data: Shortness of breath, cough  CHEST - 2 VIEW  Comparison: Cough chest x-ray of 08/12/2012  Findings: There is a new small right pleural effusion laterally which may be loculated on the lateral view.  The left lung is clear.  Mild cardiomegaly is stable.  A prosthetic right humeral head is noted.  IMPRESSION: Right pleural effusion which is new compared to the prior chest x- ray.   Original Report Authenticated By: Dwyane Dee, M.D.   DEXA scan the prolactin does not 14 date shows osteopenia in the L femur T score of -1.6  ASSESSMENT:  No evidence of disease as regards to his lymphoma. His anemia is likely unexplained leading to iron studies reasonable giving normal B12 and folic acid. Patient is on Zometa for unclear reason form his records review and I would recommend discontinuation of this given bone pains he has form this and the risk of ONJ.  PLAN:  1. Iron studies today.   2. Discontinued Zometa .Patient should take  calcium and vitamin D 600/400 twice a day 3. Return to clinic in 3 months 4. follow up iron studies.    All questions were satisfactorily answered. Patient knows to call if  any concern  arises.  I spent more than 50 % counseling the patient face to face. The total time spent in the appointment was 30 minutes.   Sherral Hammers, MD FACP. Hematology/Oncology.

## 2012-10-06 NOTE — Progress Notes (Signed)
Charles [name] presented for Portacath access and flush. Proper placement of portacath confirmed by CXR. Portacath located left chest wall accessed with  H 20 needle. Sluggish blood return and patient notified that alteplase and/or dye study may need to be done with next visit if no blood return Portacath flushed with 20ml NS and 500U/49ml Heparin and needle removed intact. Procedure without incident. Patient tolerated procedure well. Charles [name] presented for labwork. Labs per MD order drawn via Peripheral Line 23 gauge needle inserted in left forearm  Good blood return present. Procedure without incident.  Needle removed intact. Patient tolerated procedure well.

## 2012-10-07 LAB — IRON AND TIBC
Saturation Ratios: 21 % (ref 20–55)
TIBC: 325 ug/dL (ref 215–435)
UIBC: 257 ug/dL (ref 125–400)

## 2012-10-12 ENCOUNTER — Ambulatory Visit (INDEPENDENT_AMBULATORY_CARE_PROVIDER_SITE_OTHER): Payer: Medicare Other | Admitting: *Deleted

## 2012-10-12 DIAGNOSIS — Z7901 Long term (current) use of anticoagulants: Secondary | ICD-10-CM

## 2012-10-12 DIAGNOSIS — I4891 Unspecified atrial fibrillation: Secondary | ICD-10-CM

## 2012-10-19 ENCOUNTER — Ambulatory Visit (INDEPENDENT_AMBULATORY_CARE_PROVIDER_SITE_OTHER): Payer: Medicare Other | Admitting: *Deleted

## 2012-10-19 ENCOUNTER — Telehealth: Payer: Self-pay | Admitting: *Deleted

## 2012-10-19 DIAGNOSIS — Z7901 Long term (current) use of anticoagulants: Secondary | ICD-10-CM

## 2012-10-19 DIAGNOSIS — I4891 Unspecified atrial fibrillation: Secondary | ICD-10-CM

## 2012-10-19 NOTE — Telephone Encounter (Signed)
.  left message to have patient return my call.  

## 2012-10-19 NOTE — Telephone Encounter (Signed)
[facility] IS CALLING ABOUT PATIENT. BP LEFT ARM 82/58 REST, RIGHT 80/40 AT REST-PT IS NOT DIZZY BUT IS WEEK AND TIRED.  IN THE PAST HE WAS TOLD HE COULD CUT DOWN ON LOPRESSOR, WANTED TO CHECK AND MAKE SURE THIS IS WHAT WE WANT HIM TO DO.

## 2012-10-19 NOTE — Telephone Encounter (Signed)
Would have him hold Atenolol indefinitely given his hypotension. He may need further evaluation with his PCP.

## 2012-10-19 NOTE — Telephone Encounter (Signed)
Called pt to confirm current signs and symptoms, pt denies SOB/chest pains/swelling/dizzyness/headache,pt notes fatigue daily for the past two weeks, please advise

## 2012-10-19 NOTE — Telephone Encounter (Signed)
Spoke to pt to advise results/instructions. Pt understood.and will inform [facility] nurse of updated information of medication change per unable to locate phone number for [facility] rep in chart, pt wrote down medication and will inform nurse as well as Pt will call back to our office with any further concerns if necessary

## 2012-10-22 ENCOUNTER — Ambulatory Visit (HOSPITAL_COMMUNITY): Payer: Medicare Other

## 2012-10-22 NOTE — Telephone Encounter (Signed)
Received incoming call from [facility] with [facility] to advise pt BP has improved to 110/80 however his HR has elevated to 118 currently and this am it was 125, notes very irregular rhythm, pt denies SOB/chest pain/swelling,pt does note he has gained 8lbs in one week average 2lbs daily weight gain until the past 2 days, pt has stayed 188lbs, pt also notes nausea this am,and fatigue and notes feeling worse since he stopped atenolol, 99% o2 at Room Air, nurse notes no distress at this point just uncomfortable and wanted to move apt date up and possibly restart atenolol, please advise

## 2012-10-26 ENCOUNTER — Emergency Department (HOSPITAL_COMMUNITY): Payer: Medicare Other

## 2012-10-26 ENCOUNTER — Encounter (HOSPITAL_COMMUNITY): Payer: Self-pay

## 2012-10-26 ENCOUNTER — Inpatient Hospital Stay (HOSPITAL_COMMUNITY)
Admission: EM | Admit: 2012-10-26 | Discharge: 2012-10-28 | DRG: 292 | Disposition: A | Discharge status: Home / Self Care | Payer: Medicare Other | Attending: Family Medicine | Admitting: Family Medicine

## 2012-10-26 DIAGNOSIS — Z87891 Personal history of nicotine dependence: Secondary | ICD-10-CM

## 2012-10-26 DIAGNOSIS — I509 Heart failure, unspecified: Secondary | ICD-10-CM

## 2012-10-26 DIAGNOSIS — Z79899 Other long term (current) drug therapy: Secondary | ICD-10-CM

## 2012-10-26 DIAGNOSIS — Z66 Do not resuscitate: Secondary | ICD-10-CM | POA: Diagnosis present

## 2012-10-26 DIAGNOSIS — C8290 Follicular lymphoma, unspecified, unspecified site: Secondary | ICD-10-CM | POA: Diagnosis present

## 2012-10-26 DIAGNOSIS — I4510 Unspecified right bundle-branch block: Secondary | ICD-10-CM | POA: Diagnosis present

## 2012-10-26 DIAGNOSIS — C8299 Follicular lymphoma, unspecified, extranodal and solid organ sites: Secondary | ICD-10-CM | POA: Diagnosis present

## 2012-10-26 DIAGNOSIS — R7309 Other abnormal glucose: Secondary | ICD-10-CM | POA: Diagnosis present

## 2012-10-26 DIAGNOSIS — I5033 Acute on chronic diastolic (congestive) heart failure: Principal | ICD-10-CM

## 2012-10-26 DIAGNOSIS — I340 Nonrheumatic mitral (valve) insufficiency: Secondary | ICD-10-CM

## 2012-10-26 DIAGNOSIS — I059 Rheumatic mitral valve disease, unspecified: Secondary | ICD-10-CM | POA: Diagnosis present

## 2012-10-26 DIAGNOSIS — I5042 Chronic combined systolic (congestive) and diastolic (congestive) heart failure: Secondary | ICD-10-CM | POA: Diagnosis present

## 2012-10-26 DIAGNOSIS — I4891 Unspecified atrial fibrillation: Secondary | ICD-10-CM

## 2012-10-26 DIAGNOSIS — G4700 Insomnia, unspecified: Secondary | ICD-10-CM | POA: Diagnosis present

## 2012-10-26 DIAGNOSIS — E876 Hypokalemia: Secondary | ICD-10-CM | POA: Diagnosis present

## 2012-10-26 DIAGNOSIS — F411 Generalized anxiety disorder: Secondary | ICD-10-CM | POA: Diagnosis present

## 2012-10-26 DIAGNOSIS — I428 Other cardiomyopathies: Secondary | ICD-10-CM

## 2012-10-26 DIAGNOSIS — D649 Anemia, unspecified: Secondary | ICD-10-CM | POA: Diagnosis present

## 2012-10-26 DIAGNOSIS — I959 Hypotension, unspecified: Secondary | ICD-10-CM

## 2012-10-26 DIAGNOSIS — Z7901 Long term (current) use of anticoagulants: Secondary | ICD-10-CM

## 2012-10-26 DIAGNOSIS — E785 Hyperlipidemia, unspecified: Secondary | ICD-10-CM | POA: Diagnosis present

## 2012-10-26 DIAGNOSIS — R739 Hyperglycemia, unspecified: Secondary | ICD-10-CM | POA: Diagnosis present

## 2012-10-26 DIAGNOSIS — I10 Essential (primary) hypertension: Secondary | ICD-10-CM

## 2012-10-26 LAB — COMPREHENSIVE METABOLIC PANEL
Albumin: 3.5 g/dL (ref 3.5–5.2)
BUN: 18 mg/dL (ref 6–23)
Calcium: 9.3 mg/dL (ref 8.4–10.5)
Chloride: 101 mEq/L (ref 96–112)
Creatinine, Ser: 0.98 mg/dL (ref 0.50–1.35)
Total Bilirubin: 0.5 mg/dL (ref 0.3–1.2)
Total Protein: 7.3 g/dL (ref 6.0–8.3)

## 2012-10-26 LAB — CBC WITH DIFFERENTIAL/PLATELET
Basophils Absolute: 0.1 10*3/uL (ref 0.0–0.1)
Basophils Relative: 1 % (ref 0–1)
Eosinophils Absolute: 0.3 10*3/uL (ref 0.0–0.7)
Eosinophils Relative: 6 % — ABNORMAL HIGH (ref 0–5)
HCT: 30.9 % — ABNORMAL LOW (ref 39.0–52.0)
Hemoglobin: 10.4 g/dL — ABNORMAL LOW (ref 13.0–17.0)
MCH: 29.6 pg (ref 26.0–34.0)
MCHC: 33.7 g/dL (ref 30.0–36.0)
MCV: 88 fL (ref 78.0–100.0)
Monocytes Absolute: 0.5 10*3/uL (ref 0.1–1.0)
Monocytes Relative: 10 % (ref 3–12)
RDW: 17.2 % — ABNORMAL HIGH (ref 11.5–15.5)

## 2012-10-26 LAB — URINALYSIS, ROUTINE W REFLEX MICROSCOPIC
Bilirubin Urine: NEGATIVE
Leukocytes, UA: NEGATIVE
Nitrite: NEGATIVE
Specific Gravity, Urine: 1.015 (ref 1.005–1.030)
Urobilinogen, UA: 0.2 mg/dL (ref 0.0–1.0)

## 2012-10-26 LAB — URINE MICROSCOPIC-ADD ON

## 2012-10-26 LAB — GLUCOSE, CAPILLARY: Glucose-Capillary: 159 mg/dL — ABNORMAL HIGH (ref 70–99)

## 2012-10-26 LAB — TSH: TSH: 0.831 u[IU]/mL (ref 0.350–4.500)

## 2012-10-26 LAB — PRO B NATRIURETIC PEPTIDE: Pro B Natriuretic peptide (BNP): 11283 pg/mL — ABNORMAL HIGH (ref 0–125)

## 2012-10-26 LAB — TROPONIN I: Troponin I: <0.3 ng/mL (ref <0.30)

## 2012-10-26 MED ORDER — PANTOPRAZOLE SODIUM 40 MG PO TBEC
40.0000 mg | DELAYED_RELEASE_TABLET | Freq: Every day | ORAL | Status: DC
  Administered 2012-10-26 – 2012-10-28 (×3): 40 mg via ORAL
  Filled 2012-10-26 (×3): qty 1

## 2012-10-26 MED ORDER — ALBUTEROL SULFATE HFA 108 (90 BASE) MCG/ACT IN AERS: 1.0000 | INHALATION_SPRAY | Freq: Four times a day (QID) | RESPIRATORY_TRACT | Status: DC | PRN

## 2012-10-26 MED ORDER — SODIUM CHLORIDE 0.9 % IJ SOLN: 3.0000 mL | Freq: Two times a day (BID) | INTRAMUSCULAR | Status: DC

## 2012-10-26 MED ORDER — FUROSEMIDE 10 MG/ML IJ SOLN
40.0000 mg | Freq: Two times a day (BID) | INTRAMUSCULAR | Status: DC
  Administered 2012-10-27 – 2012-10-28 (×3): 40 mg via INTRAVENOUS
  Filled 2012-10-26 (×3): qty 4

## 2012-10-26 MED ORDER — POTASSIUM CHLORIDE CRYS ER 20 MEQ PO TBCR
40.0000 meq | EXTENDED_RELEASE_TABLET | ORAL | Status: AC
  Administered 2012-10-26 (×2): 40 meq via ORAL
  Filled 2012-10-26 (×2): qty 2

## 2012-10-26 MED ORDER — TRAMADOL HCL 50 MG PO TABS
50.0000 mg | ORAL_TABLET | Freq: Four times a day (QID) | ORAL | Status: DC | PRN
  Administered 2012-10-27: 50 mg via ORAL
  Filled 2012-10-26: qty 1

## 2012-10-26 MED ORDER — FUROSEMIDE 10 MG/ML IJ SOLN
40.0000 mg | Freq: Once | INTRAMUSCULAR | Status: AC
  Administered 2012-10-26: 40 mg via INTRAVENOUS
  Filled 2012-10-26: qty 4

## 2012-10-26 MED ORDER — ALBUTEROL SULFATE (5 MG/ML) 0.5% IN NEBU: 2.5000 mg | INHALATION_SOLUTION | RESPIRATORY_TRACT | Status: DC | PRN

## 2012-10-26 MED ORDER — GUAIFENESIN ER 600 MG PO TB12: 600.0000 mg | ORAL_TABLET | Freq: Two times a day (BID) | ORAL | Status: DC | PRN

## 2012-10-26 MED ORDER — ACETAMINOPHEN 325 MG PO TABS
650.0000 mg | ORAL_TABLET | Freq: Four times a day (QID) | ORAL | Status: DC | PRN
  Administered 2012-10-27 – 2012-10-28 (×3): 650 mg via ORAL
  Filled 2012-10-26 (×3): qty 2

## 2012-10-26 MED ORDER — DILTIAZEM HCL ER COATED BEADS 180 MG PO CP24
180.0000 mg | ORAL_CAPSULE | Freq: Every day | ORAL | Status: DC
  Administered 2012-10-27 – 2012-10-28 (×2): 180 mg via ORAL
  Filled 2012-10-26 (×2): qty 1

## 2012-10-26 MED ORDER — SODIUM CHLORIDE 0.9 % IJ SOLN: 3.0000 mL | INTRAMUSCULAR | Status: DC | PRN

## 2012-10-26 MED ORDER — INSULIN ASPART 100 UNIT/ML ~~LOC~~ SOLN
0.0000 [IU] | Freq: Three times a day (TID) | SUBCUTANEOUS | Status: DC
  Administered 2012-10-26: 2 [IU] via SUBCUTANEOUS
  Administered 2012-10-27: 3 [IU] via SUBCUTANEOUS

## 2012-10-26 MED ORDER — METHOCARBAMOL 500 MG PO TABS: 500.0000 mg | ORAL_TABLET | Freq: Four times a day (QID) | ORAL | Status: DC | PRN

## 2012-10-26 MED ORDER — WARFARIN SODIUM 5 MG PO TABS
5.0000 mg | ORAL_TABLET | Freq: Once | ORAL | Status: AC
  Administered 2012-10-26: 5 mg via ORAL
  Filled 2012-10-26: qty 1

## 2012-10-26 MED ORDER — HYDROCODONE-ACETAMINOPHEN 5-325 MG PO TABS
1.0000 | ORAL_TABLET | ORAL | Status: DC | PRN
  Administered 2012-10-26 – 2012-10-28 (×5): 1 via ORAL
  Filled 2012-10-26 (×5): qty 1

## 2012-10-26 MED ORDER — ENOXAPARIN SODIUM 40 MG/0.4ML ~~LOC~~ SOLN
40.0000 mg | SUBCUTANEOUS | Status: DC
  Administered 2012-10-26: 40 mg via SUBCUTANEOUS
  Filled 2012-10-26: qty 0.4

## 2012-10-26 MED ORDER — TIOTROPIUM BROMIDE MONOHYDRATE 18 MCG IN CAPS
18.0000 ug | ORAL_CAPSULE | Freq: Every day | RESPIRATORY_TRACT | Status: DC
  Administered 2012-10-26 – 2012-10-28 (×3): 18 ug via RESPIRATORY_TRACT
  Filled 2012-10-26: qty 5

## 2012-10-26 MED ORDER — SIMVASTATIN 10 MG PO TABS
5.0000 mg | ORAL_TABLET | Freq: Every day | ORAL | Status: DC
  Administered 2012-10-26 – 2012-10-27 (×2): 5 mg via ORAL
  Filled 2012-10-26 (×3): qty 1

## 2012-10-26 MED ORDER — SERTRALINE HCL 50 MG PO TABS
50.0000 mg | ORAL_TABLET | Freq: Every day | ORAL | Status: DC
  Administered 2012-10-26 – 2012-10-27 (×2): 50 mg via ORAL
  Filled 2012-10-26 (×2): qty 1

## 2012-10-26 MED ORDER — METOPROLOL TARTRATE 25 MG PO TABS
12.5000 mg | ORAL_TABLET | Freq: Two times a day (BID) | ORAL | Status: DC
  Administered 2012-10-26 – 2012-10-27 (×2): 12.5 mg via ORAL
  Filled 2012-10-26 (×2): qty 1

## 2012-10-26 MED ORDER — WARFARIN - PHARMACIST DOSING INPATIENT
Status: DC
  Administered 2012-10-27: 16:00:00

## 2012-10-26 MED ORDER — ACETAMINOPHEN 650 MG RE SUPP: 650.0000 mg | Freq: Four times a day (QID) | RECTAL | Status: DC | PRN

## 2012-10-26 MED ORDER — SODIUM CHLORIDE 0.9 % IV SOLN: 250.0000 mL | INTRAVENOUS | Status: DC | PRN

## 2012-10-26 NOTE — H&P (Signed)
[facility] History and Physical  Charles [name] GEX:[id number] DOB: 1940/08/27 DOA: 10/26/2012  Referring physician:  PCP: [name], MD  Specialists:   Chief Complaint: sob and LE edema  HPI: Charles [name] is a 72 y.o. male with past medical history that includes hypertension, A. fib, nonischemic cardiomyopathy, lymphoma, peptic ulcer disease, anxiety, presents to the emergency room with the chief complaint of worsening shortness of breath and lower extremity edema. Information is obtained from the patient and the wife is at the bedside. Wife reports that 4 days ago patient developed mild palpitations and increased shortness of breath. Patient is visited by home health nurse several times a week for blood draws for INR checks. His heart rate 4 days ago was noted to be 140. Patient is on Cardizem and according to the wife he has not missed any doses. Patient's wife gave patient one quarter of 25 mg atenolol tablet. Of note wife reports that patient had been taken off of atenolol several weeks ago. Gave patient this dosage for 2 days in a row and patient symptoms improved. He was able walk outside without worsening dyspnea. He was feeling better patient stopped taking the medication. Of note chart review indicates patient was also on Coreg at one time but he has not taken Coreg since December of 2013. Patient's symptoms resumed specifically increased lower extremity edema and worsening shortness of breath with exertion patient denied any chest pain but did have intermittent palpitations. Associated symptoms include intermittent nausea without vomiting orthopnea, worsening weakness and productive cough. He denies any fever chills or sick contacts. Wife indicates that she tried to get the husband come to the emergency room one day ago and he resisted. Lab work in the emergency room significant for a potassium level of 3.3 glucose 105, proBNP 11,283 hemoglobin 10.4, INR 1.9. EKG yields  atrial fibrillation with rapid ventricular response a right bundle branch block. Chest x-ray yields enlargement of cardiac silhouette. Suspect emphysematous changes with slightly increased right pleural effusion and bibasilar atelectasis.   Review of Systems: The patient denies anorexia, fever, weight loss,, vision loss, decreased hearing, hoarseness, chest pain, syncope, balance deficits, hemoptysis, abdominal pain, melena, hematochezia, severe indigestion/heartburn, hematuria, incontinence, genital sores, muscle weakness, suspicious skin lesions, transient blindness, difficulty walking, depression, unusual weight change, abnormal bleeding, enlarged lymph nodes, angioedema, and breast masses.    Past Medical History  Diagnosis Date  . Hypertension   . Atrial fibrillation 10/2008  . Nonischemic cardiomyopathy 02/2009    Presented with CHF; EF of 25-30%; Nonobstructive ASCVD-worst lesion 40% on coronary angiography  . Peptic ulcer disease   . Degenerative joint disease     Knees and shoulders  . Pneumonia 2010  . Low TSH level     Borderline  . Allergic rhinitis   . Anxiety   . Drug abuse   . Tobacco abuse     50 pack years  . 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 Basal cell carcinoma    Past Surgical History  Procedure Laterality Date  . Neck lesion biopsy  June 24, 2010    Follicular lymphoma  . Skin cancer excision      under left breast; variously described as melanoma and basal cell  . Rotator cuff repair      right  . Patella fracture surgery  1975  . Hematoma evacuation      after melanoma removal  . Portacath placement  07/12/2010  . Colonoscopy  2009    Also underwent an EGD;  patient reports no significant findings  . Bone marrow aspiration  06/2012  . Bone marrow biopsy  06/2012  . Fungal infection     Social History:  reports that he quit smoking about 6 months ago. His smoking use included Cigarettes. He has a 50 pack-year smoking history. He does not have any smokeless  tobacco history on file. He reports that he does not drink alcohol or use illicit drugs. Patient is a retired Archivist. He is married and lives with his wife and has done so for the last 52 years  Allergies  Allergen Reactions  . Statins     Intolerant secondary to severe myalgias    Family History  Problem Relation Age of Onset  . Heart attack Father   . Coronary artery disease Other     Prior to Admission medications   Medication Sig Start Date End Date Taking? Authorizing Provider  ALPRAZolam Prudy Feeler) 1 MG tablet Take 0.5-1 mg by mouth 3 (three) times daily.   Yes Historical Provider, MD  calcium citrate (CALCITRATE - DOSED IN MG ELEMENTAL CALCIUM) 950 MG tablet Take 1 tablet by mouth 2 (two) times daily.   Yes Historical Provider, MD  diltiazem (CARDIZEM CD) 180 MG 24 hr capsule Take 180 mg by mouth daily.   Yes Historical Provider, MD  furosemide (LASIX) 20 MG tablet Take 3 tablets (60 mg total) by mouth daily. 08/13/12  Yes Angus Edilia Bo, MD  guaiFENesin (MUCINEX) 600 MG 12 hr tablet Take 600 mg by mouth 2 (two) times daily as needed for congestion.  08/05/12  Yes Fredirick Maudlin, MD  HYDROcodone-acetaminophen (NORCO/VICODIN) 5-325 MG per tablet Take 1 tablet by mouth every 4 (four) hours as needed. 08/05/12  Yes Fredirick Maudlin, MD  lovastatin (MEVACOR) 10 MG tablet Take 10 mg by mouth at bedtime.    Yes Historical Provider, MD  omeprazole (PRILOSEC) 20 MG capsule Take 20 mg by mouth daily.   Yes Historical Provider, MD  PROAIR HFA 108 (90 BASE) MCG/ACT inhaler Inhale 1 puff into the lungs every 6 (six) hours as needed. For shortness of breath 01/15/12  Yes Historical Provider, MD  sertraline (ZOLOFT) 50 MG tablet Take 50 mg by mouth at bedtime. 08/13/12  Yes Angus Edilia Bo, MD  tiotropium (SPIRIVA) 18 MCG inhalation capsule Place 18 mcg into inhaler and inhale daily.   Yes Historical Provider, MD  traMADol (ULTRAM) 50 MG tablet Take 50 mg by mouth every 6 (six) hours as  needed. For pain 08/05/12  Yes Fredirick Maudlin, MD  warfarin (COUMADIN) 5 MG tablet Take 5 mg by mouth every evening.   Yes Historical Provider, MD  acetaminophen (TYLENOL) 325 MG tablet Take 2 tablets (650 mg total) by mouth every 6 (six) hours as needed for fever. 08/05/12   Fredirick Maudlin, MD  methocarbamol (ROBAXIN) 500 MG tablet Take 1 tablet (500 mg total) by mouth every 6 (six) hours as needed. 09/07/12   Vickki Hearing, MD   Physical Exam: Filed Vitals:   10/26/12 1300  BP: 114/78  Pulse: 98  Temp:   Resp:      General:  Well-nourished no acute distress  Eyes: PE RRL, EOMI no scleral icterus  ENT: Ears clear nose without drainage oropharynx without erythema or exudate mucous membranes of his mouth are moist and pink  Neck: Supple  positive + JVD no lymphadenopathy  Cardiovascular: Irregularly irregular rhythm. No gallop no murmur no rub 1-2+ lower extremity edema. Chest with  left anterior porto-cath intact without erythema/edema  Respiratory: Mild increased work of breathing. Breath sounds with bilateral basilar crackles. No wheeze or sounds somewhat coarse  Abdomen: Obese positive bowel sounds nontender to palpation  Skin: Cool dry no rash no lesions  Musculoskeletal: No clubbing no cyanosis  Psychiatric: Somewhat unengaging but cooperative  Neurologic: Speech clear facial symmetry cranial nerves II through XII grossly  Labs on Admission:  Basic Metabolic Panel:  Recent Labs Lab 10/26/12 1126  NA 139  K 3.3*  CL 101  CO2 28  GLUCOSE 105*  BUN 18  CREATININE 0.98  CALCIUM 9.3   Liver Function Tests:  Recent Labs Lab 10/26/12 1126  AST 17  ALT 12  ALKPHOS 81  BILITOT 0.5  PROT 7.3  ALBUMIN 3.5   CBC:  Recent Labs Lab 10/26/12 1126  WBC 5.4  NEUTROABS 3.6  HGB 10.4*  HCT 30.9*  MCV 88.0  PLT 157   Cardiac Enzymes:  Recent Labs Lab 10/26/12 1126  TROPONINI <0.30    BNP (last 3 results)  Recent Labs  08/07/12 1800  08/13/12 0455 10/26/12 1126  PROBNP 14668.0* 2442.0* 11283.0*   Radiological Exams on Admission: Dg Chest 2 View  10/26/2012   *RADIOLOGY REPORT*  Clinical Data:  Shortness of breath, lower extremity swelling, history hypertension, smoking, nonischemic cardiomyopathy  CHEST - 2 VIEW  Comparison: 10/01/2012  Findings: Left subclavian Port-A-Cath stable tip projecting over SVC. Enlargement of cardiac silhouette. Tortuous aorta. Mediastinal contours and pulmonary vascularity normal. Slight increase in right pleural effusion and right basilar atelectasis. Minimal atelectasis at left base. No acute pulmonary edema or consolidation. Suspect underlying emphysematous changes. No pneumothorax or acute osseous findings. Post right shoulder replacement.  IMPRESSION: Enlargement of cardiac silhouette. Suspect emphysematous changes with slightly increased right pleural effusion and bibasilar atelectasis.   Original Report Authenticated By: Ulyses Southward, M.D.    EKG: Independently reviewed. A. fib with rapid ventricular response  Assessment/Plan   Principal Problem:  Acute on chronic diastolic heart failure: Related to atrial fibrillation with uncontrolled rate. Will admit to telemetry. Will provide IV Lasix. Continue Cardizem. Will obtain daily weights monitor intake and output. Patient had and at go in may of this year that yielded an ejection fraction of 50-55% and mild to moderate LVH. - patient had problems with hypotension with Atenolol; he is not currently taking Coreg, not sure why - start low dose Metoprolol since it will have the least effect on blood pressure   Active Problems: Atrial fibrillation with rapid ventricular response: Wife reports that patient's heart rate has been in the range of 90-140 intermittently over the last 4 days. He reports that atenolol was discontinued several weeks go. She also indicates Coreg was discontinued in December of 2013. Will continue his home Cardizem dose of 180  mg and add 12.5 mg of metoprolol twice a day. At the time of my exam in the emergency room patient's heart rate range was 97-114. Systolic blood pressure range 829-562.  Hyperglycemia: History of same. Monitor CBGs. Will cover with sliding scale insulin as indicated. Will check his hemoglobin A1c.  Hypokalemia: Mild. Will replete and recheck. Anticipate needing daily potassium supplementation while on IV Lasix. Of note patient on 60 mg of Lasix by mouth daily but med rec form does not indicate potassium supplementation.  Nonischemic cardiomyopathy: In 2010 patient presented with CHF and ejection fraction of 25-30%. Nonobstructive ASCVD. Worst lesion 40% on coronary angiographically.  INSOMNIA, CHRONIC: Will continue home medications. HYPERTENSION: Controlled admission. See #  1 and #2 Follicular lymphoma: Chart review indicates last infusion of Zometa 09/21/2012. Wife reports chemotherapy had to be stopped do to above. Hyperlipidemia: Continue statin Anemia, normocytic normochromic: Chart review indicates his current hemoglobin of 10.4 is within his baseline range. No signs or symptoms of active bleeding. Will monitor. hronic right shoulder pain: s/p replacement. Stable at baseline. Continue pain medication regimen Anxiety: stable at baseline. Continue xanax 0.5mg  bid prn and 1mg  qhs prn.   Code Status: DO NOT RESUSCITATE Family Communication: Wife at bedside Disposition Plan: Home when ready hopefully 2-3 day  Time spent: 70 minutes  Gwenyth Bender [facility] Pager [phone]  If 7PM-7AM, please contact night-coverage [email or web address] Password [facility] 10/26/2012, 1:39 PM  I have seen and examined this patient and I agree with the above assessment and plan.  Pamella Pert, MD [facility] [phone]

## 2012-10-26 NOTE — ED Notes (Signed)
Pt and wife state pt has been SOB since Wednesday. Pt states he is unable to lay flat when he tries to sleep and his feet are more swollen

## 2012-10-26 NOTE — Progress Notes (Signed)
Pt came up to ED and in NAD. Will continue to monitor.

## 2012-10-26 NOTE — ED Provider Notes (Signed)
CSN: [id number]     Arrival date & time 10/26/12  1107 History  This chart was scribed for Gilda Crease, MD by Bennett Scrape, ED Scribe. This patient was seen in room APA18/APA18 and the patient's care was started at 11:10 AM.    Chief Complaint  Patient presents with  . Shortness of Breath    The history is provided by the patient. No language interpreter was used.    HPI Comments: Charles [name] is a 72 y.o. male with a h/o CHF who presents to the Emergency Department complaining of one week of gradual onset, gradually worsening, constant SOB described as feeling "choked up" that is typically worse at night. Pt reports that the SOB is worsened with laying in the supine position and improved with sleeping propped up with pillows. He lists worsening of bilateral lower leg swelling as an associated symptom. He admits that he has been using inhalers with improvement but came into the ED today for evaluation after speaking with his PCP. He has a h/o A. Fib and wife reports that the pt's HR has been near the 140s intermittently over the past 3 days. He is currently on Cardizem for the same and denies any missed doses. Pt was admitted in May 2014 to the ICU twice for CHF and wife reports that the pt has been diagnosed with PNA threes times since November 2013. He denies any cough, CP or nausea.   Past Medical History  Diagnosis Date  . Hypertension   . Atrial fibrillation 10/2008  . Nonischemic cardiomyopathy 02/2009    Presented with CHF; EF of 25-30%; Nonobstructive ASCVD-worst lesion 40% on coronary angiography  . Peptic ulcer disease   . Degenerative joint disease     Knees and shoulders  . Pneumonia 2010  . Low TSH level     Borderline  . Allergic rhinitis   . Anxiety   . Drug abuse   . Tobacco abuse     50 pack years  . 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 Basal cell carcinoma    Past Surgical History  Procedure Laterality Date  . Neck lesion biopsy  June 24, 2010     Follicular lymphoma  . Skin cancer excision      under left breast; variously described as melanoma and basal cell  . Rotator cuff repair      right  . Patella fracture surgery  1975  . Hematoma evacuation      after melanoma removal  . Portacath placement  07/12/2010  . Colonoscopy  2009    Also underwent an EGD; patient reports no significant findings  . Bone marrow aspiration  06/2012  . Bone marrow biopsy  06/2012  . Fungal infection     Family History  Problem Relation Age of Onset  . Heart attack Father   . Coronary artery disease Other    History  Substance Use Topics  . Smoking status: Former Smoker -- 1.00 packs/day for 50 years    Types: Cigarettes    Quit date: 04/12/2012  . Smokeless tobacco: Not on file  . Alcohol Use: No    Review of Systems  Respiratory: Positive for shortness of breath. Negative for cough.   Cardiovascular: Negative for chest pain.  Gastrointestinal: Negative for nausea and vomiting.  All other systems reviewed and are negative.    Allergies  Statins  Home Medications   Current Outpatient Rx  Name  Route  Sig  Dispense  Refill  . acetaminophen (  TYLENOL) 325 MG tablet   Oral   Take 2 tablets (650 mg total) by mouth every 6 (six) hours as needed for fever.         . calcium citrate (CALCITRATE - DOSED IN MG ELEMENTAL CALCIUM) 950 MG tablet   Oral   Take 1 tablet by mouth 2 (two) times daily.         Marland Kitchen diltiazem (CARDIZEM CD) 180 MG 24 hr capsule   Oral   Take 180 mg by mouth daily.         . furosemide (LASIX) 20 MG tablet   Oral   Take 3 tablets (60 mg total) by mouth daily.   30 tablet   5   . guaiFENesin (MUCINEX) 600 MG 12 hr tablet   Oral   Take 600 mg by mouth 2 (two) times daily.         Marland Kitchen HYDROcodone-acetaminophen (NORCO/VICODIN) 5-325 MG per tablet   Oral   Take 1 tablet by mouth every 4 (four) hours as needed.   30 tablet   0   . lovastatin (MEVACOR) 10 MG tablet   Oral   Take 10 mg by mouth daily.           . methocarbamol (ROBAXIN) 500 MG tablet   Oral   Take 1 tablet (500 mg total) by mouth every 6 (six) hours as needed.   60 tablet   0   . omeprazole (PRILOSEC) 20 MG capsule   Oral   Take 20 mg by mouth daily.         Marland Kitchen PROAIR HFA 108 (90 BASE) MCG/ACT inhaler   Inhalation   Inhale 1 puff into the lungs every 6 (six) hours as needed. For shortness of breath         . sertraline (ZOLOFT) 50 MG tablet   Oral   Take 1 tablet (50 mg total) by mouth daily.   30 tablet   5   . traMADol (ULTRAM) 50 MG tablet   Oral   Take 50 mg by mouth every 6 (six) hours as needed. For pain         . warfarin (COUMADIN) 5 MG tablet   Oral   Take 5 mg by mouth every evening.          Triage Vitals: Temp(Src) 97.6 F (36.4 C) (Oral)  Resp 20  Ht 5\' 6"  (1.676 m)  Wt 188 lb 6 oz (85.446 kg)  BMI 30.42 kg/m2  SpO2 92%  Physical Exam  Nursing note and vitals reviewed. Constitutional: He is oriented to person, place, and time. He appears well-developed and well-nourished. No distress.  HENT:  Head: Normocephalic and atraumatic.  Right Ear: Hearing normal.  Left Ear: Hearing normal.  Nose: Nose normal.  Mouth/Throat: Oropharynx is clear and moist and mucous membranes are normal.  Eyes: Conjunctivae and EOM are normal. Pupils are equal, round, and reactive to light.  Neck: Normal range of motion. Neck supple.  Cardiovascular: Normal rate, S1 normal and S2 normal.  An irregularly irregular rhythm present. Exam reveals no gallop and no friction rub.   No murmur heard. Pulmonary/Chest: Effort normal. No respiratory distress. He has rales (bilateral crackles at the bases of both lungs). He exhibits no tenderness.  Abdominal: Soft. Normal appearance and bowel sounds are normal. There is no hepatosplenomegaly. There is no tenderness. There is no rebound, no guarding, no tenderness at McBurney's point and negative Murphy's sign. No hernia.  Musculoskeletal: Normal range of  motion.  He exhibits edema (lower extremity edema).  Neurological: He is alert and oriented to person, place, and time. He has normal strength. No cranial nerve deficit or sensory deficit. Coordination normal. GCS eye subscore is 4. GCS verbal subscore is 5. GCS motor subscore is 6.  Skin: Skin is warm, dry and intact. No rash noted. No cyanosis.  Psychiatric: He has a normal mood and affect. His speech is normal and behavior is normal. Thought content normal.    ED Course   Procedures (including critical care time)  DIAGNOSTIC STUDIES: Oxygen Saturation is 92% on room air, adequate by my interpretation.    EKG:  Date: 10/26/2012  Rate: 104  Rhythm: atrial fibrillation  QRS Axis: normal  Intervals: normal  ST/T Wave abnormalities: nonspecific ST/T changes  Conduction Disutrbances:right bundle branch block  Narrative Interpretation:   Old EKG Reviewed: unchanged    COORDINATION OF CARE: 11:15 AM-Discussed treatment plan which includes CXR, CBC panel, CMP and UA with pt at bedside and pt agreed to plan.    Labs Reviewed  CBC WITH DIFFERENTIAL - Abnormal; Notable for the following:    RBC 3.51 (*)    Hemoglobin 10.4 (*)    HCT 30.9 (*)    RDW 17.2 (*)    Eosinophils Relative 6 (*)    All other components within normal limits  COMPREHENSIVE METABOLIC PANEL - Abnormal; Notable for the following:    Potassium 3.3 (*)    Glucose, Bld 105 (*)    GFR calc non Af Amer 80 (*)    All other components within normal limits  PRO B NATRIURETIC PEPTIDE - Abnormal; Notable for the following:    Pro B Natriuretic peptide (BNP) 11283.0 (*)    All other components within normal limits  PROTIME-INR - Abnormal; Notable for the following:    Prothrombin Time 21.9 (*)    INR 1.98 (*)    All other components within normal limits  TROPONIN I  URINALYSIS, ROUTINE W REFLEX MICROSCOPIC   Dg Chest 2 View  10/26/2012   *RADIOLOGY REPORT*  Clinical Data:  Shortness of breath, lower extremity swelling,  history hypertension, smoking, nonischemic cardiomyopathy  CHEST - 2 VIEW  Comparison: 10/01/2012  Findings: Left subclavian Port-A-Cath stable tip projecting over SVC. Enlargement of cardiac silhouette. Tortuous aorta. Mediastinal contours and pulmonary vascularity normal. Slight increase in right pleural effusion and right basilar atelectasis. Minimal atelectasis at left base. No acute pulmonary edema or consolidation. Suspect underlying emphysematous changes. No pneumothorax or acute osseous findings. Post right shoulder replacement.  IMPRESSION: Enlargement of cardiac silhouette. Suspect emphysematous changes with slightly increased right pleural effusion and bibasilar atelectasis.   Original Report Authenticated By: Ulyses Southward, M.D.   Diagnosis: CHF exacerbation  MDM  Presents to the ER for evaluation of shortness of breath or really one week. Patient reports progressively worsening shortness of breath, worsened by lying flat. He has not been able to sleep because of this. He has not been experiencing chest pain. Patient has noticed increased lower extremity edema. He has a history of heart failure.  EKG does not show any acute changes and his troponin was negative. He does have a markedly elevated BNP and increasing pleural effusion on x-ray consistent with CHF exacerbation. Patient will be hospitalized for further management.  I personally performed the services described in this documentation, which was scribed in my presence. The recorded information has been reviewed and is accurate.    Gilda Crease, MD 10/26/12 [phone]

## 2012-10-26 NOTE — ED Notes (Signed)
Vital signs stable. 

## 2012-10-26 NOTE — Progress Notes (Signed)
ANTICOAGULATION CONSULT NOTE - Initial Consult  Pharmacy Consult for Coumadin (chronic PTA) Indication: atrial fibrillation  Allergies  Allergen Reactions  . Statins     Intolerant secondary to severe myalgias    Patient Measurements: Height: 5\' 6"  (167.6 cm) Weight: 188 lb (85.276 kg) IBW/kg (Calculated) : 63.8  Vital Signs: Temp: 97.4 F (36.3 C) (08/05 1420) Temp src: Oral (08/05 1420) BP: 116/78 mmHg (08/05 1420) Pulse Rate: 101 (08/05 1420)  Labs:  Recent Labs  10/26/12 1126  HGB 10.4*  HCT 30.9*  PLT 157  LABPROT 21.9*  INR 1.98*  CREATININE 0.98  TROPONINI <0.30    Estimated Creatinine Clearance: 68.7 ml/min (by C-G formula based on Cr of 0.98).   Medical History: Past Medical History  Diagnosis Date  . Hypertension   . Atrial fibrillation 10/2008  . Nonischemic cardiomyopathy 02/2009    Presented with CHF; EF of 25-30%; Nonobstructive ASCVD-worst lesion 40% on coronary angiography  . Peptic ulcer disease   . Degenerative joint disease     Knees and shoulders  . Pneumonia 2010  . Low TSH level     Borderline  . Allergic rhinitis   . Anxiety   . Drug abuse   . Tobacco abuse     50 pack years  . 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 Basal cell carcinoma     Medications:  Prescriptions prior to admission  Medication Sig Dispense Refill  . ALPRAZolam (XANAX) 1 MG tablet Take 0.5-1 mg by mouth 3 (three) times daily.      . calcium citrate (CALCITRATE - DOSED IN MG ELEMENTAL CALCIUM) 950 MG tablet Take 1 tablet by mouth 2 (two) times daily.      Marland Kitchen diltiazem (CARDIZEM CD) 180 MG 24 hr capsule Take 180 mg by mouth daily.      . furosemide (LASIX) 20 MG tablet Take 3 tablets (60 mg total) by mouth daily.  30 tablet  5  . guaiFENesin (MUCINEX) 600 MG 12 hr tablet Take 600 mg by mouth 2 (two) times daily as needed for congestion.       Marland Kitchen HYDROcodone-acetaminophen (NORCO/VICODIN) 5-325 MG per tablet Take 1 tablet by mouth every 4 (four) hours as needed.  30  tablet  0  . lovastatin (MEVACOR) 10 MG tablet Take 10 mg by mouth at bedtime.       Marland Kitchen omeprazole (PRILOSEC) 20 MG capsule Take 20 mg by mouth daily.      Marland Kitchen PROAIR HFA 108 (90 BASE) MCG/ACT inhaler Inhale 1 puff into the lungs every 6 (six) hours as needed. For shortness of breath      . sertraline (ZOLOFT) 50 MG tablet Take 50 mg by mouth at bedtime.      Marland Kitchen tiotropium (SPIRIVA) 18 MCG inhalation capsule Place 18 mcg into inhaler and inhale daily.      . traMADol (ULTRAM) 50 MG tablet Take 50 mg by mouth every 6 (six) hours as needed. For pain      . warfarin (COUMADIN) 5 MG tablet Take 5 mg by mouth every evening.      Marland Kitchen acetaminophen (TYLENOL) 325 MG tablet Take 2 tablets (650 mg total) by mouth every 6 (six) hours as needed for fever.      . methocarbamol (ROBAXIN) 500 MG tablet Take 1 tablet (500 mg total) by mouth every 6 (six) hours as needed.  60 tablet  0    Assessment: 72yo male on chronic Coumadin for h/o afib.  INR is just below target  range on admission.  Home dose listed above. Goal of Therapy:  INR 2-3 Monitor platelets by anticoagulation protocol: Yes   Plan:  Coumadin 5mg  today x 1 INR daily  [name], Eilyn Polack A 10/26/2012,4:18 PM

## 2012-10-26 NOTE — ED Notes (Signed)
Unable to give report. Nurse unavailable

## 2012-10-26 NOTE — Progress Notes (Signed)
UR Chart Review Completed  

## 2012-10-26 NOTE — ED Notes (Signed)
Pt denies pain. Resting with eyes closed. Warm blanket given. Monitor continues to show a fib

## 2012-10-27 ENCOUNTER — Other Ambulatory Visit: Payer: Self-pay

## 2012-10-27 DIAGNOSIS — I340 Nonrheumatic mitral (valve) insufficiency: Secondary | ICD-10-CM

## 2012-10-27 DIAGNOSIS — I059 Rheumatic mitral valve disease, unspecified: Secondary | ICD-10-CM

## 2012-10-27 DIAGNOSIS — I959 Hypotension, unspecified: Secondary | ICD-10-CM

## 2012-10-27 LAB — GLUCOSE, CAPILLARY
Glucose-Capillary: 113 mg/dL — ABNORMAL HIGH (ref 70–99)
Glucose-Capillary: 203 mg/dL — ABNORMAL HIGH (ref 70–99)

## 2012-10-27 LAB — BASIC METABOLIC PANEL
Chloride: 103 mEq/L (ref 96–112)
GFR calc Af Amer: 78 mL/min — ABNORMAL LOW (ref >90)
Potassium: 4.1 mEq/L (ref 3.5–5.1)
Sodium: 141 mEq/L (ref 135–145)

## 2012-10-27 LAB — CBC
HCT: 34.1 % — ABNORMAL LOW (ref 39.0–52.0)
Hemoglobin: 11.1 g/dL — ABNORMAL LOW (ref 13.0–17.0)
RBC: 3.87 MIL/uL — ABNORMAL LOW (ref 4.22–5.81)
WBC: 5.5 10*3/uL (ref 4.0–10.5)

## 2012-10-27 LAB — TROPONIN I: Troponin I: <0.3 ng/mL (ref <0.30)

## 2012-10-27 LAB — PROTIME-INR: INR: 1.93 — ABNORMAL HIGH (ref 0.00–1.49)

## 2012-10-27 MED ORDER — WARFARIN SODIUM 7.5 MG PO TABS
7.5000 mg | ORAL_TABLET | Freq: Once | ORAL | Status: AC
  Administered 2012-10-27: 7.5 mg via ORAL
  Filled 2012-10-27: qty 1

## 2012-10-27 MED ORDER — ALPRAZOLAM 0.5 MG PO TABS
0.5000 mg | ORAL_TABLET | Freq: Two times a day (BID) | ORAL | Status: DC | PRN
  Administered 2012-10-27: 0.5 mg via ORAL
  Filled 2012-10-27 (×3): qty 1

## 2012-10-27 MED ORDER — DIGOXIN 125 MCG PO TABS
0.1250 mg | ORAL_TABLET | Freq: Every day | ORAL | Status: DC
  Administered 2012-10-27 – 2012-10-28 (×2): 0.125 mg via ORAL
  Filled 2012-10-27 (×2): qty 1

## 2012-10-27 MED ORDER — METOPROLOL TARTRATE 25 MG PO TABS
25.0000 mg | ORAL_TABLET | Freq: Two times a day (BID) | ORAL | Status: DC
  Administered 2012-10-27 – 2012-10-28 (×2): 25 mg via ORAL
  Filled 2012-10-27 (×2): qty 1

## 2012-10-27 MED ORDER — ALPRAZOLAM 1 MG PO TABS
1.0000 mg | ORAL_TABLET | Freq: Every evening | ORAL | Status: DC | PRN
  Administered 2012-10-27: 1 mg via ORAL

## 2012-10-27 NOTE — Care Management Note (Signed)
    Page 1 of 1   10/28/2012     10:36:13 AM   CARE MANAGEMENT NOTE 10/28/2012  Patient:  Charles [name], Charles [name]   Account Number:  [id number]  Date Initiated:  10/27/2012  Documentation initiated by:  Rosemary Holms  Subjective/Objective Assessment:   Pt admitted form home where he lives with his spouse. Spouse involved with daily care and meds. Heart failure with Atrial fib.     Action/Plan:   May need HH RN and will do a [facility] referral to follow on outpt basis   Anticipated DC Date:  10/29/2012   Anticipated DC Plan:  HOME/SELF CARE      DC Planning Services  CM consult      Choice offered to / List presented to:             Status of service:  Completed, signed off Medicare Important Message given?  NA - LOS <3 / Initial given by admissions (If response is "NO", the following Medicare IM given date fields will be blank) Date Medicare IM given:   Date Additional Medicare IM given:    Discharge Disposition:  HOME/SELF CARE  Per UR Regulation:    If discussed at Long Length of Stay Meetings, dates discussed:    Comments:  10/28/12 Rosemary Holms RN BSN CM Pt DC'd. Anibal Henderson received referral and will f/u.  8/6/14Amy Leanord Hawking RN BSN CM Will refer pt to [facility]

## 2012-10-27 NOTE — Progress Notes (Signed)
ANTICOAGULATION CONSULT NOTE   Pharmacy Consult for Coumadin (chronic PTA) Indication: atrial fibrillation  Allergies  Allergen Reactions  . Statins     Intolerant secondary to severe myalgias   Patient Measurements: Height: 5\' 6"  (167.6 cm) Weight: 183 lb (83.008 kg) IBW/kg (Calculated) : 63.8  Vital Signs: Temp: 98 F (36.7 C) (08/06 0441) Temp src: Oral (08/06 0441) BP: 101/69 mmHg (08/06 0441) Pulse Rate: 76 (08/06 0441)  Labs:  Recent Labs  10/26/12 1126 10/26/12 1831 10/27/12 0419  HGB 10.4*  --  11.1*  HCT 30.9*  --  34.1*  PLT 157  --  191  LABPROT 21.9*  --  21.5*  INR 1.98*  --  1.93*  CREATININE 0.98  --  1.06  TROPONINI <0.30 <0.30 <0.30   Estimated Creatinine Clearance: 62.8 ml/min (by C-G formula based on Cr of 1.06).  Medical History: Past Medical History  Diagnosis Date  . Hypertension   . Atrial fibrillation 10/2008  . Nonischemic cardiomyopathy 02/2009    Presented with CHF; EF of 25-30%; Nonobstructive ASCVD-worst lesion 40% on coronary angiography  . Peptic ulcer disease   . Degenerative joint disease     Knees and shoulders  . Pneumonia 2010  . Low TSH level     Borderline  . Allergic rhinitis   . Anxiety   . Drug abuse   . Tobacco abuse     50 pack years  . 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 Basal cell carcinoma    Medications:  Prescriptions prior to admission  Medication Sig Dispense Refill  . ALPRAZolam (XANAX) 1 MG tablet Take 0.5-1 mg by mouth 3 (three) times daily.      . calcium citrate (CALCITRATE - DOSED IN MG ELEMENTAL CALCIUM) 950 MG tablet Take 1 tablet by mouth 2 (two) times daily.      Marland Kitchen diltiazem (CARDIZEM CD) 180 MG 24 hr capsule Take 180 mg by mouth daily.      . furosemide (LASIX) 20 MG tablet Take 3 tablets (60 mg total) by mouth daily.  30 tablet  5  . guaiFENesin (MUCINEX) 600 MG 12 hr tablet Take 600 mg by mouth 2 (two) times daily as needed for congestion.       Marland Kitchen HYDROcodone-acetaminophen (NORCO/VICODIN)  5-325 MG per tablet Take 1 tablet by mouth every 4 (four) hours as needed.  30 tablet  0  . lovastatin (MEVACOR) 10 MG tablet Take 10 mg by mouth at bedtime.       Marland Kitchen omeprazole (PRILOSEC) 20 MG capsule Take 20 mg by mouth daily.      Marland Kitchen PROAIR HFA 108 (90 BASE) MCG/ACT inhaler Inhale 1 puff into the lungs every 6 (six) hours as needed. For shortness of breath      . sertraline (ZOLOFT) 50 MG tablet Take 50 mg by mouth at bedtime.      Marland Kitchen tiotropium (SPIRIVA) 18 MCG inhalation capsule Place 18 mcg into inhaler and inhale daily.      . traMADol (ULTRAM) 50 MG tablet Take 50 mg by mouth every 6 (six) hours as needed. For pain      . warfarin (COUMADIN) 5 MG tablet Take 5 mg by mouth every evening.      Marland Kitchen acetaminophen (TYLENOL) 325 MG tablet Take 2 tablets (650 mg total) by mouth every 6 (six) hours as needed for fever.      . methocarbamol (ROBAXIN) 500 MG tablet Take 1 tablet (500 mg total) by mouth every 6 (six) hours as  needed.  60 tablet  0   Assessment: 72yo male on chronic Coumadin for h/o afib.  INR is just below target range on admission and is lower today but > 1.9.  Home dose listed above.  Goal of Therapy:  INR 2-3 Monitor platelets by anticoagulation protocol: Yes   Plan:  Coumadin 7.5mg  today x 1 INR daily  Josedaniel Haye A 10/27/2012,8:35 AM

## 2012-10-27 NOTE — Consult Note (Signed)
The patient was seen and examined, and I agree with the assessment and plan as documented above.  Pt with A Fib with RVR which likely precipitated acute diastolic heart failure. He now has normal LV systolic function as per echo in May. He has a severely dilated LA, which makes his atrial fibrillation likely to be permanent. His moderate to severe mitral regurgitation is likely a major contributing factor. Will increase Metoprolol to 25 mg bid (which should have less of an effect on his blood pressure, and he did not tolerate Atenolol due to it causing hypotension) and also add Digoxin, as we continue to monitor renal function.

## 2012-10-27 NOTE — Consult Note (Signed)
CARDIOLOGY CONSULT NOTE  Patient ID: Charles [name] MRN: [id number] DOB/AGE: 1940-11-19 72 y.o.  Admit date: 10/26/2012 Referring Physician;PTH Primary PhysicianKNOWLTON,STEPHEN D, MD Primary Cardiologist:Rothbart, MD Reason for Consultation:Atrial fib with RVR and CHF Principal Problem:   Acute on chronic diastolic heart failure Active Problems:   INSOMNIA, CHRONIC   HYPERTENSION   Atrial fibrillation   Follicular lymphoma   Hyperlipidemia   Anemia, normocytic normochromic   Atrial fibrillation with rapid ventricular response   Hyperglycemia   Hypokalemia   Nonischemic cardiomyopathy  HPI: Charles [name] is a 72 y/o patient with known history of atrial fibrillation on chronic anticoagulation, hyper tension, hyperlipidemia, nonischemic heart myopathy, admitted with worsening shortness of breath or lower extremity edema. He is followed by home health nurse several times a week for INR checks. He was found to have an elevated heart rate about 4 days ago, was given an additional 25 mg of atenolol. He was normally on atenolol 25 mg daily along with diltiazem 180 mg daily. Symptoms persisted, prompting evaluation in the emergency room.    On admission emergency room the patient's blood pressure is 111/69, heart rate of 98, O2 sat 92%. Potassium was 3.3 with a glucose of 105. Pro BNP was elevated at 11,293. Chest X. ray demonstrated emphysematous changes with slightly increased right pleural effusion and bibasilar atelectasis. No evidence of CHF or pulmonary edema. EKG revealed atrial fib with RVR rate of 104 beats per man with right bundle branch block. He was to be with Lasix injection 40 mg IV, and admitted for diuresis and further evaluation. He is diuresed 3685 cc since admission.   He admits to dietary noncompliance eating salty foods, but is taking his medications daily, as he wife prepares them for him. He states he is feeling better with the diureses.Most recent echocardiogram in May of 2014  demonstrated return to normal systolic function of 55%.   .     Review of systems complete and found to be negative unless listed above   Past Medical History  Diagnosis Date  . Hypertension   . Atrial fibrillation 10/2008  . Nonischemic cardiomyopathy 02/2009    Presented with CHF; EF of 25-30%; Nonobstructive ASCVD-worst lesion 40% on coronary angiography  . Peptic ulcer disease   . Degenerative joint disease     Knees and shoulders  . Pneumonia 2010  . Low TSH level     Borderline  . Allergic rhinitis   . Anxiety   . Drug abuse   . Tobacco abuse     50 pack years  . 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 Basal cell carcinoma     Family History  Problem Relation Age of Onset  . Heart attack Father   . Coronary artery disease Other     History   Social History  . Marital Status: Married    Spouse Name: N/A    Number of Children: N/A  . Years of Education: N/A   Occupational History  . Full time    Social History Main Topics  . Smoking status: Former Smoker -- 1.00 packs/day for 50 years    Types: Cigarettes    Quit date: 04/12/2012  . Smokeless tobacco: Not on file  . Alcohol Use: No  . Drug Use: No  . Sexually Active: Not on file   Other Topics Concern  . Not on file   Social History Narrative   Married   No regular exercise    Past Surgical History  Procedure Laterality Date  .  Neck lesion biopsy  June 24, 2010    Follicular lymphoma  . Skin cancer excision      under left breast; variously described as melanoma and basal cell  . Rotator cuff repair      right  . Patella fracture surgery  1975  . Hematoma evacuation      after melanoma removal  . Portacath placement  07/12/2010  . Colonoscopy  2009    Also underwent an EGD; patient reports no significant findings  . Bone marrow aspiration  06/2012  . Bone marrow biopsy  06/2012  . Fungal infection       Prescriptions prior to admission  Medication Sig Dispense Refill  . ALPRAZolam (XANAX) 1 MG  tablet Take 0.5-1 mg by mouth 3 (three) times daily.      . calcium citrate (CALCITRATE - DOSED IN MG ELEMENTAL CALCIUM) 950 MG tablet Take 1 tablet by mouth 2 (two) times daily.      Marland Kitchen diltiazem (CARDIZEM CD) 180 MG 24 hr capsule Take 180 mg by mouth daily.      . furosemide (LASIX) 20 MG tablet Take 3 tablets (60 mg total) by mouth daily.  30 tablet  5  . guaiFENesin (MUCINEX) 600 MG 12 hr tablet Take 600 mg by mouth 2 (two) times daily as needed for congestion.       Marland Kitchen HYDROcodone-acetaminophen (NORCO/VICODIN) 5-325 MG per tablet Take 1 tablet by mouth every 4 (four) hours as needed.  30 tablet  0  . lovastatin (MEVACOR) 10 MG tablet Take 10 mg by mouth at bedtime.       Marland Kitchen omeprazole (PRILOSEC) 20 MG capsule Take 20 mg by mouth daily.      Marland Kitchen PROAIR HFA 108 (90 BASE) MCG/ACT inhaler Inhale 1 puff into the lungs every 6 (six) hours as needed. For shortness of breath      . sertraline (ZOLOFT) 50 MG tablet Take 50 mg by mouth at bedtime.      Marland Kitchen tiotropium (SPIRIVA) 18 MCG inhalation capsule Place 18 mcg into inhaler and inhale daily.      . traMADol (ULTRAM) 50 MG tablet Take 50 mg by mouth every 6 (six) hours as needed. For pain      . warfarin (COUMADIN) 5 MG tablet Take 5 mg by mouth every evening.      Marland Kitchen acetaminophen (TYLENOL) 325 MG tablet Take 2 tablets (650 mg total) by mouth every 6 (six) hours as needed for fever.      . methocarbamol (ROBAXIN) 500 MG tablet Take 1 tablet (500 mg total) by mouth every 6 (six) hours as needed.  60 tablet  0   Echocardiogram: 08/09/2012 Left ventricle: The cavity size was mildly dilated. Wall thickness was increased in a pattern of mild LVH. Systolic function was normal. The estimated ejection fraction was in the range of 50% to 55%. - Mitral valve: MR is at least moderate-severe inseverity. There is an central jet of MR and also an eccentric jet that is directed posterior into LA. - Left atrium: The atrium was severely dilated. - Right atrium: The  atrium was mildly to moderately dilated. - Pulmonary arteries: PA peak pressure: 44mm Hg (S).  Physical Exam: Blood pressure 103/70, pulse 105, temperature 98 F (36.7 C), temperature source Oral, resp. rate 18, height 5\' 6"  (1.676 m), weight 183 lb (83.008 kg), SpO2 98.00%.   General: Well developed, well nourished, in no acute distress Head: Eyes PERRLA, No xanthomas.   Normal cephalic  and atramatic  Lungs: Clear bilaterally to auscultation and percussion. Heart: HRIR S1 S2, with soft systolic murmur.  Pulses are 2+ & equal.            No carotid bruit. No JVD.   Abdomen: Bowel sounds are positive, abdomen soft and non-tender without masses or                  Hernia's noted. Msk:  Back normal, normal gait. Normal strength and tone for age. Extremities: No clubbing, cyanosis, mild ankle edema.  DP +1 Neuro: Alert and oriented X 3. Psych:  Good affect, responds appropriately    Labs:   Lab Results  Component Value Date   WBC 5.5 10/27/2012   HGB 11.1* 10/27/2012   HCT 34.1* 10/27/2012   MCV 88.1 10/27/2012   PLT 191 10/27/2012    Recent Labs Lab 10/26/12 1126 10/27/12 0419  NA 139 141  K 3.3* 4.1  CL 101 103  CO2 28 28  BUN 18 20  CREATININE 0.98 1.06  CALCIUM 9.3 9.0  PROT 7.3  --   BILITOT 0.5  --   ALKPHOS 81  --   ALT 12  --   AST 17  --   GLUCOSE 105* 113*   Lab Results  Component Value Date   CKTOTAL 105 03/17/2009   CKMB 1.7 03/17/2009   TROPONINI <0.30 10/27/2012      BNP (last 3 results)  Recent Labs  08/07/12 1800 08/13/12 0455 10/26/12 1126  PROBNP 14668.0* 2442.0* 11283.0*      Radiology: Dg Chest 2 View  10/26/2012   *RADIOLOGY REPORT*  Clinical Data:  Shortness of breath, lower extremity swelling, history hypertension, smoking, nonischemic cardiomyopathy  CHEST - 2 VIEW  Comparison: 10/01/2012  Findings: Left subclavian Port-A-Cath stable tip projecting over SVC. Enlargement of cardiac silhouette. Tortuous aorta. Mediastinal contours and  pulmonary vascularity normal. Slight increase in right pleural effusion and right basilar atelectasis. Minimal atelectasis at left base. No acute pulmonary edema or consolidation. Suspect underlying emphysematous changes. No pneumothorax or acute osseous findings. Post right shoulder replacement.  IMPRESSION: Enlargement of cardiac silhouette. Suspect emphysematous changes with slightly increased right pleural effusion and bibasilar atelectasis.   Original Report Authenticated By: Ulyses Southward, M.D.   RUE:AVWUJW fibrillation RVR rate of 104 bpm with RBBB. Telemetry this am: Atrial fib with rates in the 110-120 bpm.  ASSESSMENT AND PLAN:   1.Atrial fib with RVR; Currently not well controlled on medication regimen. Will increase metoprolol to 25 mg BID. Does not appear that he is on daily BB on review of home medications. Consider increasing dose of dilt if BP tolerates. Continue anticoagulation therapy per pharmacy.   2. Acute CHF: Most likely related to atrial fib with elevated rate. He is feeling better and diuresing on IV lasix. EF is normalized per last echo on 5/4. Chest is clear and there is only minimal evidence of ankle edema in the dependent position.   3. Hypokalemia: On arrival potassium 3.3, but has been repleted and K+ this am is 4.1.   4. Hypertension: Blood pressure is low normal Will watch closely with increased dose of BB. May need to adjust dilt dose, or add digoxin if HR control is insufficient.     Signed: Bettey Mare. Lyman Bishop NP [facility] 10/27/2012, 9:52 AM Co-Sign MD

## 2012-10-27 NOTE — Progress Notes (Signed)
NAME:  Charles [name], Charles [name] NO.:  [id number]  MEDICAL RECORD NO.:  [id number]  LOCATION:  A336                          FACILITY:  [facility]  PHYSICIAN:  Mila Homer. [name], M.D.DATE OF BIRTH:  1940/04/17  DATE OF PROCEDURE: DATE OF DISCHARGE:                                PROGRESS NOTE   SUBJECTIVE:  This 72 year old was admitted to the hospital last night with congestive heart failure and atrial fibrillation.  He feels a good deal better today.  He tells me at home he will be good for few days then feel short of breath for few days.  Things got so bad yesterday that he was brought to the hospital.  OBJECTIVE:  VITAL SIGNS:  Temperature 98 degrees, pulse 76, respiratory rate 18, blood pressure 101/69. GENERAL:  He is sitting up in a chair, feet on the floor.  Color is good.  He is breathing well.  He is in no acute distress at present. HEART:  Irregularity of rate of about 100. LUNGS:  Decreased breath sounds throughout but he is moving air well. EXTREMITIES:  He has no edema.  LABORATORY DATA:  He had an essentially normal BMP today and hemoglobin 11.1.  His pro beta natriuretic peptide on admission was 11,283, and his troponins have been normal at less than 0.30.  INR is 1.93.  ASSESSMENT: 1. Congestive heart failure, acute on chronic systolic. 2. Chronic atrial fibrillation. 3. Warfarin anticoagulation. 4. Follicular lymphoma. 5. Nonischemic cardiomyopathy. 6. Hypertension, well controlled.  PLAN:  He is to continue his current meds today, and Cardiology will see him and make recommendations on how to control the above issues outpatient.    Mila Homer. [name], M.D.    SDK/MEDQ  D:  10/27/2012  T:  10/27/2012  Job:  [id number]

## 2012-10-28 ENCOUNTER — Telehealth: Payer: Self-pay | Admitting: *Deleted

## 2012-10-28 ENCOUNTER — Ambulatory Visit (INDEPENDENT_AMBULATORY_CARE_PROVIDER_SITE_OTHER): Payer: Medicare Other | Admitting: *Deleted

## 2012-10-28 DIAGNOSIS — I4891 Unspecified atrial fibrillation: Secondary | ICD-10-CM

## 2012-10-28 DIAGNOSIS — Z7901 Long term (current) use of anticoagulants: Secondary | ICD-10-CM

## 2012-10-28 LAB — GLUCOSE, CAPILLARY

## 2012-10-28 LAB — PROTIME-INR
INR: 2.24 — ABNORMAL HIGH (ref 0.00–1.49)
Prothrombin Time: 24.1 seconds — ABNORMAL HIGH (ref 11.6–15.2)

## 2012-10-28 MED ORDER — WARFARIN SODIUM 5 MG PO TABS: 5.0000 mg | ORAL_TABLET | Freq: Once | ORAL | Status: DC

## 2012-10-28 MED ORDER — METOPROLOL TARTRATE 25 MG PO TABS: 25.0000 mg | ORAL_TABLET | Freq: Two times a day (BID) | ORAL | Status: DC

## 2012-10-28 MED ORDER — POTASSIUM CHLORIDE ER 10 MEQ PO TBCR: 10.0000 meq | EXTENDED_RELEASE_TABLET | Freq: Two times a day (BID) | ORAL | Status: DC

## 2012-10-28 MED ORDER — DIGOXIN 125 MCG PO TABS: 0.1250 mg | ORAL_TABLET | Freq: Every day | ORAL | Status: DC

## 2012-10-28 NOTE — Telephone Encounter (Signed)
PT WAS DISCHARGE FROM HOSPITAL TODAY AND GIVEN DOSE TO TAKE FOR TODAY BUT THAT'S ALL. PATIENTS WIFE HAS HIS RESULTS AND WANTS TO KNOW WHAT HE SCHEDULE SHOULD BE

## 2012-10-28 NOTE — Progress Notes (Signed)
ANTICOAGULATION CONSULT NOTE   Pharmacy Consult for Coumadin (chronic PTA) Indication: atrial fibrillation  Allergies  Allergen Reactions  . Statins     Intolerant secondary to severe myalgias   Patient Measurements: Height: 5\' 6"  (167.6 cm) Weight: 182 lb 8 oz (82.781 kg) IBW/kg (Calculated) : 63.8  Vital Signs: Temp: 98 F (36.7 C) (08/07 0554) Temp src: Oral (08/07 0554) BP: 106/68 mmHg (08/07 0554) Pulse Rate: 85 (08/07 0554)  Labs:  Recent Labs  10/26/12 1126 10/26/12 1831 10/27/12 0419 10/28/12 0524  HGB 10.4*  --  11.1*  --   HCT 30.9*  --  34.1*  --   PLT 157  --  191  --   LABPROT 21.9*  --  21.5* 24.1*  INR 1.98*  --  1.93* 2.24*  CREATININE 0.98  --  1.06  --   TROPONINI <0.30 <0.30 <0.30  --    Estimated Creatinine Clearance: 62.7 ml/min (by C-G formula based on Cr of 1.06).  Medical History: Past Medical History  Diagnosis Date  . Hypertension   . Atrial fibrillation 10/2008  . Nonischemic cardiomyopathy 02/2009    Presented with CHF; EF of 25-30%; Nonobstructive ASCVD-worst lesion 40% on coronary angiography  . Peptic ulcer disease   . Degenerative joint disease     Knees and shoulders  . Pneumonia 2010  . Low TSH level     Borderline  . Allergic rhinitis   . Anxiety   . Drug abuse   . Tobacco abuse     50 pack years  . 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 Basal cell carcinoma    Medications:  Prescriptions prior to admission  Medication Sig Dispense Refill  . ALPRAZolam (XANAX) 1 MG tablet Take 0.5-1 mg by mouth 3 (three) times daily.      . calcium citrate (CALCITRATE - DOSED IN MG ELEMENTAL CALCIUM) 950 MG tablet Take 1 tablet by mouth 2 (two) times daily.      Marland Kitchen diltiazem (CARDIZEM CD) 180 MG 24 hr capsule Take 180 mg by mouth daily.      . furosemide (LASIX) 20 MG tablet Take 3 tablets (60 mg total) by mouth daily.  30 tablet  5  . guaiFENesin (MUCINEX) 600 MG 12 hr tablet Take 600 mg by mouth 2 (two) times daily as needed for  congestion.       Marland Kitchen HYDROcodone-acetaminophen (NORCO/VICODIN) 5-325 MG per tablet Take 1 tablet by mouth every 4 (four) hours as needed.  30 tablet  0  . lovastatin (MEVACOR) 10 MG tablet Take 10 mg by mouth at bedtime.       Marland Kitchen omeprazole (PRILOSEC) 20 MG capsule Take 20 mg by mouth daily.      Marland Kitchen PROAIR HFA 108 (90 BASE) MCG/ACT inhaler Inhale 1 puff into the lungs every 6 (six) hours as needed. For shortness of breath      . sertraline (ZOLOFT) 50 MG tablet Take 50 mg by mouth at bedtime.      Marland Kitchen tiotropium (SPIRIVA) 18 MCG inhalation capsule Place 18 mcg into inhaler and inhale daily.      . traMADol (ULTRAM) 50 MG tablet Take 50 mg by mouth every 6 (six) hours as needed. For pain      . warfarin (COUMADIN) 5 MG tablet Take 5 mg by mouth every evening.      Marland Kitchen acetaminophen (TYLENOL) 325 MG tablet Take 2 tablets (650 mg total) by mouth every 6 (six) hours as needed for fever.      Marland Kitchen  methocarbamol (ROBAXIN) 500 MG tablet Take 1 tablet (500 mg total) by mouth every 6 (six) hours as needed.  60 tablet  0   Assessment: 72yo male on chronic Coumadin for h/o afib.  INR is just below target range on admission but is therapeutic today.  Home dose listed above.  Goal of Therapy:  INR 2-3 Monitor platelets by anticoagulation protocol: Yes   Plan:  Coumadin 5mg  today x 1 INR daily  Anai Lipson A 10/28/2012,7:54 AM

## 2012-10-28 NOTE — Discharge Summary (Signed)
NAME:  Charles [name], Charles [name] NO.:  [id number]  MEDICAL RECORD NO.:  [id number]  LOCATION:  A336                          FACILITY:  [facility]  PHYSICIAN:  Mila Homer. [name], M.D.DATE OF BIRTH:  04-08-1940  DATE OF ADMISSION:  10/26/2012 DATE OF DISCHARGE:  LH                              DISCHARGE SUMMARY   This 72 year old was admitted to the hospital with significant edema and shortness of breath.  He had a benign 3-day hospitalization extending from August 5 to October 28, 2012.  Vital signs remained stable.  His white cell count was 5400, hemoglobin 10.4, recheck 11.1.  Chemistry showed potassium 3.3, then came up to 4.1.  His troponins were 0.30 x3 and pro beta natriuretic peptide was 11,283.  INRs were between 1.98 and 2.24.  Glucose is in the low 100 range with an A1c 5.6, TSH 0.831.  UA was negative.  Admission chest x-ray showed enlargement of the cardiac silhouette and suspicion of emphysema with a slightly increased right pleural effusion and bibasilar atelectasis.  His admission EKG showed atrial fibrillation with rapid ventricular response.  The rate was 104.  He also had a right bundle-branch block.  He was admitted to the hospital.  He was started on Lanoxin 0.125 mg daily and put on Lopressor 25 mg b.i.d., in addition to his usual meds of alprazolam, Spiriva, furosemide, diltiazem, albuterol, etc.  He was given 40 mg of Lasix IV in the ER.  He did well on this regimen, feeling better on his second day but by his third day his strength was back and he was ready for discharge home  He was seen by [facility], Lorin Picket, nurse practitioner.  FINAL DISCHARGE DIAGNOSES: 1. Congestive heart failure secondary to atrial fibrillation. 2. Atrial fibrillation with rapid ventricular response. 3. Warfarin anticoagulation. 4. Follicular lymphoma. 5. Nonischemic cardiomyopathy. 6. Hypertension. 7. Hypokalemia.  He is discharged home on the  following new medications:  Digoxin 0.125 mg daily (30 with 11 refills), metoprolol 25 mg b.i.d. (60 with 11 refills), and potassium chloride 10 mEq b.i.d. (60 with 11 refills).  He is to continue with acetaminophen 325-25 mg, 2-6 hours for fever or pain, alprazolam 1 mg, 1/2 to 1 tab t.i.d. for anxiety, calcium citrate 950 mg b.i.d., diltiazem CD 180 mg daily, furosemide 20 mg, 3 tablets daily, guaifenesin 600 mg b.i.d. for congestion, hydrocodone/APAP 5/325 one q.4 hours for pain, lovastatin 10 mg at bedtime, methocarbamol 500 mg q.6 hours for pain or muscle spasm, omeprazole 20 mg daily, ProAir HFA 1 puff in the lungs every 6 hours for shortness of breath, sertraline 50 mg at bedtime, Spiriva 18 mcg inhaler daily, tramadol 50 mg q.6 hours for pain, and warfarin 5 mg as directed.     Mila Homer. [name], M.D.     SDK/MEDQ  D:  10/28/2012  T:  10/28/2012  Job:  [id number]

## 2012-10-28 NOTE — Progress Notes (Signed)
Pt verbalizes understanding of d/c instructions, medication changes, and follow up appt needed in a couple of days with Dr. [name]. Pt has no questions at this time. IV was d/c. Pt was d/c via wheelchair accompanied by NT. His friend is at bedside and is going to be taking pt home.  Sheryn Bison

## 2012-10-28 NOTE — Telephone Encounter (Signed)
See coumadin note 10/28/12

## 2012-10-29 ENCOUNTER — Telehealth: Payer: Self-pay | Admitting: Cardiology

## 2012-10-29 ENCOUNTER — Ambulatory Visit (INDEPENDENT_AMBULATORY_CARE_PROVIDER_SITE_OTHER): Payer: Medicare Other | Admitting: *Deleted

## 2012-10-29 DIAGNOSIS — Z7901 Long term (current) use of anticoagulants: Secondary | ICD-10-CM

## 2012-10-29 DIAGNOSIS — I10 Essential (primary) hypertension: Secondary | ICD-10-CM

## 2012-10-29 DIAGNOSIS — I4891 Unspecified atrial fibrillation: Secondary | ICD-10-CM

## 2012-10-29 NOTE — Telephone Encounter (Signed)
Patient's wife states that patient's BP is running low and wants to know if she can hold the Lopressor until patient is seen on Monday. / tgs

## 2012-10-29 NOTE — Telephone Encounter (Signed)
Decrease lasix to 40 mg daily (from 60 mg daily). He may be getting a little dehydrated. Have him get labs drawn on Sat before being seen on Monday. BMET.

## 2012-10-29 NOTE — Telephone Encounter (Signed)
.  Called pt to confirm current signs and symptoms, noted low BP of 94/69 this am and at lunch time as well, had pt re-check while in the phone 90/52 HR 79  denies chest pain/SOB/swelling, notes fatigue slightly, states "I feel good" no other symptoms noted, just started digoxin 0.125QD, as well as lopressor and diltazem, please advise

## 2012-10-29 NOTE — Telephone Encounter (Signed)
.  left message to have patient return my call. Noted in message for pt to decrease lasix to 40mg  and to go to [facility] tomorrow to have labs drawn per lab orders have been placed in the chart for pt to have drawn tomorrow, will see pt at apt on Monday with SM

## 2012-10-30 LAB — BASIC METABOLIC PANEL
BUN: 33 mg/dL — ABNORMAL HIGH (ref 6–23)
Chloride: 99 mEq/L (ref 96–112)
Creat: 1.68 mg/dL — ABNORMAL HIGH (ref 0.50–1.35)
Glucose, Bld: 112 mg/dL — ABNORMAL HIGH (ref 70–99)

## 2012-10-31 ENCOUNTER — Encounter: Payer: Self-pay | Admitting: Cardiology

## 2012-11-01 ENCOUNTER — Ambulatory Visit (INDEPENDENT_AMBULATORY_CARE_PROVIDER_SITE_OTHER): Payer: Medicare Other | Admitting: Cardiology

## 2012-11-01 ENCOUNTER — Telehealth: Payer: Self-pay | Admitting: *Deleted

## 2012-11-01 ENCOUNTER — Encounter: Payer: Self-pay | Admitting: Cardiology

## 2012-11-01 ENCOUNTER — Ambulatory Visit: Payer: Medicare Other | Admitting: Cardiology

## 2012-11-01 VITALS — BP 110/62 | HR 87 | Ht 66.0 in | Wt 177.0 lb

## 2012-11-01 DIAGNOSIS — I4891 Unspecified atrial fibrillation: Secondary | ICD-10-CM

## 2012-11-01 DIAGNOSIS — I5032 Chronic diastolic (congestive) heart failure: Secondary | ICD-10-CM

## 2012-11-01 DIAGNOSIS — I428 Other cardiomyopathies: Secondary | ICD-10-CM

## 2012-11-01 MED ORDER — DILTIAZEM HCL ER COATED BEADS 240 MG PO CP24: 240.0000 mg | ORAL_CAPSULE | Freq: Every day | ORAL | Status: DC

## 2012-11-01 NOTE — Assessment & Plan Note (Signed)
Prone to exacerbations with atrial fibrillation and also moderate to severe mitral regurgitation. We discussed this today. He continues on Lasix with potassium supplements. Reminded them about following weights and increasing Lasix dose as needed for weight gain. Followup BMET for next week as ordered. One-month visit scheduled.

## 2012-11-01 NOTE — Progress Notes (Signed)
Clinical Summary [name] is a medically complex 72 y.o.male presenting for office visit. This is my first meeting with him. He is a former patient of Dr. Dietrich Pates, most recently seen in hospital consultation by Dr. Purvis Sheffield. History reviewed. Recent medication adjustments were noted - discharged last week on combination of metoprolol and digoxin as well as baseline Cardizem CD for control of atrial fibrillation rate.  Reportedly he felt somewhat better first day or so after discharge, however has been fatigued since then. Heart rate seems to be reasonably well controlled, not hypotensive. Wife states that his weight went up "7 pounds" in one day, then the next day was not nearly as elevated without any specific intervention. We did review sodium restriction, also increasing Lasix dose as needed temporarily for weight gain of 3 or more pounds in one day.  The patient has fairly flat affect, did not contribute much to the conversation today, difficult to know his baseline in just meeting him. His wife states that his memory has not been good. He mainly complains of being tired.  Most recent echocardiogram in May indicated mild LVH with LVEF 50-55%, moderate to severe mitral regurgitation, severely dilated left atrium, moderately dilated right atrium, PASP 44 mm mercury.   Allergies  Allergen Reactions  . Statins     Intolerant secondary to severe myalgias    Current Outpatient Prescriptions  Medication Sig Dispense Refill  . acetaminophen (TYLENOL) 325 MG tablet Take 2 tablets (650 mg total) by mouth every 6 (six) hours as needed for fever.      . ALPRAZolam (XANAX) 1 MG tablet Take 0.5-1 mg by mouth 3 (three) times daily.      . calcium citrate (CALCITRATE - DOSED IN MG ELEMENTAL CALCIUM) 950 MG tablet Take 1 tablet by mouth 2 (two) times daily.      . digoxin (LANOXIN) 0.125 MG tablet Take 1 tablet (0.125 mg total) by mouth daily.  30 tablet  11  . furosemide (LASIX) 20 MG tablet Take  3 tablets (60 mg total) by mouth daily.  30 tablet  5  . guaiFENesin (MUCINEX) 600 MG 12 hr tablet Take 600 mg by mouth 2 (two) times daily as needed for congestion.       Marland Kitchen HYDROcodone-acetaminophen (NORCO/VICODIN) 5-325 MG per tablet Take 1 tablet by mouth every 4 (four) hours as needed.  30 tablet  0  . lovastatin (MEVACOR) 10 MG tablet Take 10 mg by mouth at bedtime.       . metoprolol tartrate (LOPRESSOR) 25 MG tablet Take 1 tablet (25 mg total) by mouth 2 (two) times daily.  60 tablet  11  . omeprazole (PRILOSEC) 20 MG capsule Take 20 mg by mouth daily.      . potassium chloride (K-DUR) 10 MEQ tablet Take 1 tablet (10 mEq total) by mouth 2 (two) times daily.  30 tablet  11  . PROAIR HFA 108 (90 BASE) MCG/ACT inhaler Inhale 1 puff into the lungs every 6 (six) hours as needed. For shortness of breath      . sertraline (ZOLOFT) 50 MG tablet Take 50 mg by mouth at bedtime.      Marland Kitchen tiotropium (SPIRIVA) 18 MCG inhalation capsule Place 18 mcg into inhaler and inhale daily.      . traMADol (ULTRAM) 50 MG tablet Take 50 mg by mouth every 6 (six) hours as needed. For pain      . warfarin (COUMADIN) 5 MG tablet Take 5 mg by mouth every  evening.      . diltiazem (CARDIZEM CD) 240 MG 24 hr capsule Take 1 capsule (240 mg total) by mouth daily.  90 capsule  3  . methocarbamol (ROBAXIN) 500 MG tablet Take 1 tablet (500 mg total) by mouth every 6 (six) hours as needed.  60 tablet  0   No current facility-administered medications for this visit.   Facility-Administered Medications Ordered in Other Visits  Medication Dose Route Frequency Provider Last Rate Last Dose  . sodium chloride 0.9 % injection 10 mL  10 mL Intracatheter PRN Randall An, MD   10 mL at 10/15/10 1230    Past Medical History  Diagnosis Date  . Essential hypertension, benign   . Atrial fibrillation 10/2008  . 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 Peptic ulcer disease     . Degenerative joint disease     Knees and shoulders  . Pneumonia 2010  . Allergic rhinitis   . Anxiety   . Drug abuse   . Tobacco abuse     50 pack years  . 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 Basal cell carcinoma   . Mitral regurgitation     Moderate to severe May 2014    Social History [name] reports that he quit smoking about 6 months ago. His smoking use included Cigarettes. He has a 50 pack-year smoking history. He does not have any smokeless tobacco history on file. [name] reports that he does not drink alcohol.  Review of Systems As outlined above.  Physical Examination Filed Vitals:   11/01/12 1533  BP: 110/62  Pulse: 87   Filed Weights   11/01/12 1533  Weight: 177 lb (80.287 kg)   Chronically ill-appearing male in no obvious distress. HEENT: Conjunctiva and lids normal, oropharynx clear. Neck: Supple, no elevated JVP or carotid bruits, no thyromegaly. Lungs: Clear to auscultation, decreased in bases, nonlabored breathing at rest. Cardiac: Irregularly irregular, no S3, apical systolic murmur, no pericardial rub. Abdomen: Soft, nontender, bowel sounds present, no guarding or rebound. Extremities: Mild edema, distal pulses 2+. Skin: Warm and dry. Musculoskeletal: No kyphosis. Neuropsychiatric: Alert and oriented x3, flat affect .   Problem List and Plan   Atrial fibrillation Permanent, plan is to address with strategy of heart rate control and anticoagulation. Records reviewed. He is on 3 different rate control medications after recent hospitalization. Seems to feel more fatigue, although heart rate and blood pressure are reasonable today. Plan will be to stop metoprolol and continue digoxin with diltiazem CD  increased to 240 mg daily.  Chronic diastolic heart failure Prone to exacerbations with atrial fibrillation and also moderate to severe mitral regurgitation. We discussed this today. He continues on Lasix with potassium supplements. Reminded them  about following weights and increasing Lasix dose as needed for weight gain. Followup BMET for next week as ordered. One-month visit scheduled.  Nonischemic cardiomyopathy Most recent LVEF 50-55%.    Jonelle Sidle, M.D., F.A.C.C.

## 2012-11-01 NOTE — Telephone Encounter (Signed)
Pt established care with Dr Nona Dell today, all concerns addressed at OV

## 2012-11-01 NOTE — Telephone Encounter (Signed)
This will need to be clarified again with the patient's wife. According to our records and what she told me today, the patient has been on Cardizem CD 180 mg once a day. If this is the case, then we should go ahead with the 240 mg daily dose.

## 2012-11-01 NOTE — Assessment & Plan Note (Signed)
Most recent LVEF 50-55%.

## 2012-11-01 NOTE — Assessment & Plan Note (Signed)
Permanent, plan is to address with strategy of heart rate control and anticoagulation. Records reviewed. He is on 3 different rate control medications after recent hospitalization. Seems to feel more fatigue, although heart rate and blood pressure are reasonable today. Plan will be to stop metoprolol and continue digoxin with diltiazem CD  increased to 240 mg daily.

## 2012-11-01 NOTE — Telephone Encounter (Signed)
Noted incoming call from pt pharmacy rep Melissa to advise clarification of Diltiazem 240mg  once daily per noted pt has been taking diltiazem 180mg  TWICE daily since May 23rd 2014 per filled from Dr Renard Matter office, please advise if the 240mg  once daily is still the correct dosage per update, noted pt listed in chart as taking 180mg  ONCE daily as well as discharge summary from ED was to be taking 180mg  once daily, pharmacy noted pt confusion at times when it comes to his medications, please advise

## 2012-11-01 NOTE — Telephone Encounter (Signed)
Pt established care with Dr Samuel McDowell today, all concerns addressed at OV 

## 2012-11-01 NOTE — Patient Instructions (Addendum)
Your physician recommends that you schedule a follow-up appointment in: ONE MONTH WITH DR [facility]  Your physician has recommended you make the following change in your medication:   1) STOP TAKING LOPRESSOR  2) INCREASE DILTIAZEM TO 240MG  ONCE DAILY  Your physician has requested that you regularly monitor and record your blood pressure readings at home. Please use the same machine at the same time of day to check your readings and record them to bring to your follow-up visit.  Your physician recommends that you weigh, daily, at the same time every day, and in the same amount of clothing. Please record your daily weights on the handout provided and bring it to your next appointment.USE LASIX AS DISCUSSED WITH DR MCDOWELL TODAY AT YOUR OFFICE VISIT, BRING ALL BLOOD PRESSURE/WEIGHT RECORDINGS WITH YOU AT NEXT OFFICE VISIT  Your physician recommends that you return for lab work in: USE SLIPS GIVEN TODAY PER [facility] NP ADVISED WITH LAST LABS DRAWN

## 2012-11-02 ENCOUNTER — Other Ambulatory Visit: Payer: Self-pay | Admitting: Cardiology

## 2012-11-02 ENCOUNTER — Ambulatory Visit (INDEPENDENT_AMBULATORY_CARE_PROVIDER_SITE_OTHER): Payer: Medicare Other | Admitting: *Deleted

## 2012-11-02 DIAGNOSIS — I4891 Unspecified atrial fibrillation: Secondary | ICD-10-CM

## 2012-11-02 DIAGNOSIS — Z7901 Long term (current) use of anticoagulants: Secondary | ICD-10-CM

## 2012-11-02 NOTE — Telephone Encounter (Signed)
Pt wife advised that he was started on 180mg  twice daily however he was advised to cut back to once daily months ago, this nurse advised the pt wife and the pharmacy to go ahead and start taking the 240mg  once daily, pt and pharmacy rep understood instructions.

## 2012-11-02 NOTE — Telephone Encounter (Signed)
Pharmacy rep Dannielle Huh advised the clarification was made by the pt wife and the new rx was picked up for cardizem CD 240mg  once daily last night

## 2012-11-02 NOTE — Telephone Encounter (Signed)
Medication sent via escribe.  

## 2012-11-04 ENCOUNTER — Other Ambulatory Visit (HOSPITAL_COMMUNITY): Payer: Self-pay | Admitting: Oncology

## 2012-11-04 DIAGNOSIS — B379 Candidiasis, unspecified: Secondary | ICD-10-CM

## 2012-11-04 MED ORDER — NYSTATIN 100000 UNIT/GM EX POWD: Freq: Four times a day (QID) | CUTANEOUS | Status: DC

## 2012-11-08 ENCOUNTER — Ambulatory Visit (INDEPENDENT_AMBULATORY_CARE_PROVIDER_SITE_OTHER): Payer: Medicare Other | Admitting: *Deleted

## 2012-11-08 DIAGNOSIS — Z7901 Long term (current) use of anticoagulants: Secondary | ICD-10-CM

## 2012-11-08 DIAGNOSIS — I4891 Unspecified atrial fibrillation: Secondary | ICD-10-CM

## 2012-11-08 LAB — POCT INR: INR: 1.4

## 2012-11-10 ENCOUNTER — Encounter: Payer: Self-pay | Admitting: Cardiology

## 2012-11-15 ENCOUNTER — Telehealth: Payer: Self-pay | Admitting: *Deleted

## 2012-11-15 ENCOUNTER — Ambulatory Visit (INDEPENDENT_AMBULATORY_CARE_PROVIDER_SITE_OTHER): Payer: Medicare Other | Admitting: Cardiovascular Disease

## 2012-11-15 DIAGNOSIS — Z7901 Long term (current) use of anticoagulants: Secondary | ICD-10-CM

## 2012-11-15 DIAGNOSIS — I4891 Unspecified atrial fibrillation: Secondary | ICD-10-CM

## 2012-11-15 NOTE — Telephone Encounter (Signed)
INR 1.4 today

## 2012-11-15 NOTE — Telephone Encounter (Signed)
Talked with pts wife and encounter done and coumadin dosed

## 2012-11-17 ENCOUNTER — Encounter (HOSPITAL_COMMUNITY): Payer: Medicare Other

## 2012-11-24 ENCOUNTER — Ambulatory Visit (INDEPENDENT_AMBULATORY_CARE_PROVIDER_SITE_OTHER): Payer: Medicare Other | Admitting: *Deleted

## 2012-11-24 ENCOUNTER — Telehealth: Payer: Self-pay | Admitting: Cardiovascular Disease

## 2012-11-24 ENCOUNTER — Encounter (HOSPITAL_COMMUNITY): Payer: Medicare Other | Attending: Oncology

## 2012-11-24 DIAGNOSIS — C8299 Follicular lymphoma, unspecified, extranodal and solid organ sites: Secondary | ICD-10-CM

## 2012-11-24 DIAGNOSIS — J189 Pneumonia, unspecified organism: Secondary | ICD-10-CM | POA: Insufficient documentation

## 2012-11-24 DIAGNOSIS — Z7901 Long term (current) use of anticoagulants: Secondary | ICD-10-CM

## 2012-11-24 DIAGNOSIS — D649 Anemia, unspecified: Secondary | ICD-10-CM | POA: Insufficient documentation

## 2012-11-24 DIAGNOSIS — I4891 Unspecified atrial fibrillation: Secondary | ICD-10-CM

## 2012-11-24 DIAGNOSIS — D709 Neutropenia, unspecified: Secondary | ICD-10-CM | POA: Insufficient documentation

## 2012-11-24 DIAGNOSIS — Z452 Encounter for adjustment and management of vascular access device: Secondary | ICD-10-CM

## 2012-11-24 MED ORDER — HEPARIN SOD (PORK) LOCK FLUSH 100 UNIT/ML IV SOLN
500.0000 [IU] | Freq: Once | INTRAVENOUS | Status: AC
  Administered 2012-11-24: 500 [IU] via INTRAVENOUS
  Filled 2012-11-24: qty 5

## 2012-11-24 MED ORDER — SODIUM CHLORIDE 0.9 % IJ SOLN
10.0000 mL | INTRAMUSCULAR | Status: DC | PRN
  Administered 2012-11-24: 10 mL via INTRAVENOUS
  Filled 2012-11-24: qty 10

## 2012-11-24 MED ORDER — HEPARIN SOD (PORK) LOCK FLUSH 100 UNIT/ML IV SOLN
INTRAVENOUS | Status: AC
  Filled 2012-11-24: qty 5

## 2012-11-24 NOTE — Telephone Encounter (Signed)
Pt 21.9 / INR - 1.8 / please call with instructions / tgs

## 2012-11-24 NOTE — Progress Notes (Signed)
Charles [name] presented for Portacath access and flush. Proper placement of portacath confirmed by CXR in May of this year with dye study. Portacath located lt chest wall accessed with  H 20 needle.  No blood return and flushes with difficulty. Portacath flushed with 20ml NS and 500U/69ml Heparin and needle removed intact. Patient tolerated procedure well.

## 2012-11-24 NOTE — Telephone Encounter (Signed)
See coumadin note. 

## 2012-11-30 ENCOUNTER — Ambulatory Visit (INDEPENDENT_AMBULATORY_CARE_PROVIDER_SITE_OTHER): Payer: Medicare Other | Admitting: *Deleted

## 2012-11-30 DIAGNOSIS — Z7901 Long term (current) use of anticoagulants: Secondary | ICD-10-CM

## 2012-11-30 DIAGNOSIS — I4891 Unspecified atrial fibrillation: Secondary | ICD-10-CM

## 2012-12-09 ENCOUNTER — Ambulatory Visit: Payer: Medicare Other | Admitting: *Deleted

## 2012-12-09 ENCOUNTER — Ambulatory Visit: Payer: Medicare Other | Admitting: Cardiology

## 2012-12-09 DIAGNOSIS — Z7901 Long term (current) use of anticoagulants: Secondary | ICD-10-CM

## 2012-12-09 DIAGNOSIS — I4891 Unspecified atrial fibrillation: Secondary | ICD-10-CM

## 2012-12-09 LAB — POCT INR: INR: 4

## 2012-12-15 ENCOUNTER — Ambulatory Visit: Payer: Medicare Other | Admitting: *Deleted

## 2012-12-15 DIAGNOSIS — Z7901 Long term (current) use of anticoagulants: Secondary | ICD-10-CM

## 2012-12-15 DIAGNOSIS — I4891 Unspecified atrial fibrillation: Secondary | ICD-10-CM

## 2012-12-15 LAB — POCT INR: INR: 1.9

## 2012-12-21 ENCOUNTER — Ambulatory Visit (INDEPENDENT_AMBULATORY_CARE_PROVIDER_SITE_OTHER): Payer: Medicare Other | Admitting: Cardiology

## 2012-12-21 ENCOUNTER — Encounter: Payer: Self-pay | Admitting: Cardiology

## 2012-12-21 VITALS — BP 128/61 | HR 74 | Ht 66.0 in | Wt 191.0 lb

## 2012-12-21 DIAGNOSIS — I4891 Unspecified atrial fibrillation: Secondary | ICD-10-CM

## 2012-12-21 DIAGNOSIS — I5032 Chronic diastolic (congestive) heart failure: Secondary | ICD-10-CM

## 2012-12-21 DIAGNOSIS — I059 Rheumatic mitral valve disease, unspecified: Secondary | ICD-10-CM

## 2012-12-21 DIAGNOSIS — I428 Other cardiomyopathies: Secondary | ICD-10-CM

## 2012-12-21 DIAGNOSIS — I10 Essential (primary) hypertension: Secondary | ICD-10-CM

## 2012-12-21 DIAGNOSIS — I340 Nonrheumatic mitral (valve) insufficiency: Secondary | ICD-10-CM

## 2012-12-21 NOTE — Patient Instructions (Addendum)
Your physician recommends that you schedule a follow-up appointment in: 3 months. You will receive a reminder letter in the mail in about 1 month reminding you to call and schedule your appointment. If you don't receive this letter, please contact our office. Your physician recommends that you continue on your current medications as directed. Please refer to the Current Medication list given to you today. 

## 2012-12-21 NOTE — Assessment & Plan Note (Signed)
Prone to exacerbations as noted previously. His weight is up, although clinically he feels well, did develop progressive renal insufficiency on Lasix 60 mg daily. We may have to eventually change him to 40 mg alternating with 60 mg every other day.

## 2012-12-21 NOTE — Assessment & Plan Note (Signed)
Moderate to severe, being managed conservatively at this time. Also contributes to exacerbations of diastolic heart failure.

## 2012-12-21 NOTE — Assessment & Plan Note (Signed)
Most recent assessment in May revealed LVEF 50-55%.

## 2012-12-21 NOTE — Assessment & Plan Note (Signed)
Blood pressure control is reasonable today. 

## 2012-12-21 NOTE — Assessment & Plan Note (Signed)
Continue strategy of heart rate control and anticoagulation. 

## 2012-12-21 NOTE — Progress Notes (Signed)
Clinical Summary Mr. Holte is a medically complex 72 y.o.male last seen in August. Medications were adjusted at that time. He presents today stating that he has been doing well, family member present confirms this. Weight has gone up, although his standing Lasix dose was cut back due to progressive renal insufficiency, still using p.r.n. Increased Lasix for leg swelling.  His most recent BMET showed potassium 4.7, BUN 17, creatinine 1.0. Prior to this creatinine had increased to 1.6 and Lasix was reduced.  Most recent echocardiogram in May indicated mild LVH with LVEF 50-55%, moderate to severe mitral regurgitation, severely dilated left atrium, moderately dilated right atrium, PASP 44 mm mercury.   Allergies  Allergen Reactions  . Statins     Intolerant secondary to severe myalgias    Current Outpatient Prescriptions  Medication Sig Dispense Refill  . acetaminophen (TYLENOL) 325 MG tablet Take 2 tablets (650 mg total) by mouth every 6 (six) hours as needed for fever.      . ALPRAZolam (XANAX) 1 MG tablet Take 0.5-1 mg by mouth 3 (three) times daily.      . calcium citrate (CALCITRATE - DOSED IN MG ELEMENTAL CALCIUM) 950 MG tablet Take 1 tablet by mouth 2 (two) times daily.      . digoxin (LANOXIN) 0.125 MG tablet Take 1 tablet (0.125 mg total) by mouth daily.  30 tablet  11  . diltiazem (CARDIZEM CD) 240 MG 24 hr capsule Take 1 capsule (240 mg total) by mouth daily.  90 capsule  3  . furosemide (LASIX) 20 MG tablet Take 3 tablets (60 mg total) by mouth daily.  30 tablet  5  . guaiFENesin (MUCINEX) 600 MG 12 hr tablet Take 600 mg by mouth 2 (two) times daily as needed for congestion.       Marland Kitchen HYDROcodone-acetaminophen (NORCO/VICODIN) 5-325 MG per tablet Take 1 tablet by mouth every 4 (four) hours as needed.  30 tablet  0  . lovastatin (MEVACOR) 10 MG tablet Take 10 mg by mouth at bedtime.       . metoprolol tartrate (LOPRESSOR) 25 MG tablet Take 1 tablet (25 mg total) by mouth 2 (two)  times daily.  60 tablet  11  . nystatin (MYCOSTATIN) powder Apply topically 4 (four) times daily.  15 g  0  . omeprazole (PRILOSEC) 20 MG capsule Take 20 mg by mouth daily.      . potassium chloride (K-DUR) 10 MEQ tablet Take 1 tablet (10 mEq total) by mouth 2 (two) times daily.  30 tablet  11  . PROAIR HFA 108 (90 BASE) MCG/ACT inhaler Inhale 1 puff into the lungs every 6 (six) hours as needed. For shortness of breath      . sertraline (ZOLOFT) 50 MG tablet Take 50 mg by mouth at bedtime.      Marland Kitchen tiotropium (SPIRIVA) 18 MCG inhalation capsule Place 18 mcg into inhaler and inhale daily.      . traMADol (ULTRAM) 50 MG tablet Take 50 mg by mouth every 6 (six) hours as needed. For pain      . warfarin (COUMADIN) 5 MG tablet Take 5 mg by mouth every evening.      . warfarin (COUMADIN) 5 MG tablet Take 1 tablet on Monday and Thursday. Take 1/2 tablet on Tues, Weds, Fri, Sat, Sun  30 tablet  3   No current facility-administered medications for this visit.   Facility-Administered Medications Ordered in Other Visits  Medication Dose Route Frequency Provider Last Rate Last  Dose  . sodium chloride 0.9 % injection 10 mL  10 mL Intracatheter PRN Randall An, MD   10 mL at 10/15/10 1230    Past Medical History  Diagnosis Date  . Essential hypertension, benign   . Atrial fibrillation 10/2008  . 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 Peptic ulcer disease   . Degenerative joint disease     Knees and shoulders  . Pneumonia 2010  . Allergic rhinitis   . Anxiety   . Drug abuse   . Tobacco abuse     50 pack years  . 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 Basal cell carcinoma   . Mitral regurgitation     Moderate to severe May 2014    Social History Mr. Bley reports that he quit smoking about 8 months ago. His smoking use included Cigarettes. He has a 50 pack-year smoking history. He has never used smokeless tobacco. Mr. Hochstetler reports  that he does not drink alcohol.  Review of Systems No chest pain, reports NYHA class II dyspnea with low-level activity, uses a walker. He is having problems with knee pain, will be seeing an orthopedic specialist soon. Otherwise negative.  Physical Examination Filed Vitals:   12/21/12 1100  BP: 128/61  Pulse: 74   Filed Weights   12/21/12 1100  Weight: 191 lb (86.637 kg)    Chronically ill-appearing male in no distress.  HEENT: Conjunctiva and lids normal, oropharynx clear.  Neck: Supple, no elevated JVP or carotid bruits, no thyromegaly.  Lungs: Clear to auscultation, decreased in bases, nonlabored breathing at rest.  Cardiac: Irregularly irregular, no S3, apical systolic murmur, no pericardial rub.  Abdomen: Soft, nontender, bowel sounds present, no guarding or rebound.  Extremities: Trace ankle edema, distal pulses 2+.  Skin: Warm and dry.  Musculoskeletal: No kyphosis.  Neuropsychiatric: Alert and oriented x3, flat affect .   Problem List and Plan   Atrial fibrillation Continue strategy of heart rate control and anticoagulation.  Chronic diastolic heart failure Prone to exacerbations as noted previously. His weight is up, although clinically he feels well, did develop progressive renal insufficiency on Lasix 60 mg daily. We may have to eventually change him to 40 mg alternating with 60 mg every other day.  Essential hypertension, benign Blood pressure control is reasonable today.  Nonischemic cardiomyopathy Most recent assessment in May revealed LVEF 50-55%.  Mitral regurgitation Moderate to severe, being managed conservatively at this time. Also contributes to exacerbations of diastolic heart failure.    Jonelle Sidle, M.D., F.A.C.C.

## 2012-12-22 ENCOUNTER — Ambulatory Visit: Payer: Medicare Other | Admitting: *Deleted

## 2012-12-22 ENCOUNTER — Telehealth: Payer: Self-pay | Admitting: *Deleted

## 2012-12-22 DIAGNOSIS — Z7901 Long term (current) use of anticoagulants: Secondary | ICD-10-CM

## 2012-12-22 DIAGNOSIS — I4891 Unspecified atrial fibrillation: Secondary | ICD-10-CM

## 2012-12-22 LAB — POCT INR: INR: 2.2

## 2012-12-22 NOTE — Telephone Encounter (Signed)
22

## 2012-12-22 NOTE — Telephone Encounter (Signed)
See coumadin note. 

## 2012-12-23 ENCOUNTER — Ambulatory Visit: Payer: Medicare Other | Admitting: Orthopedic Surgery

## 2012-12-29 LAB — POCT INR: INR: 3

## 2012-12-30 ENCOUNTER — Other Ambulatory Visit (HOSPITAL_COMMUNITY): Payer: Self-pay | Admitting: Internal Medicine

## 2012-12-30 ENCOUNTER — Ambulatory Visit (INDEPENDENT_AMBULATORY_CARE_PROVIDER_SITE_OTHER): Payer: Medicare Other | Admitting: *Deleted

## 2012-12-30 ENCOUNTER — Ambulatory Visit (INDEPENDENT_AMBULATORY_CARE_PROVIDER_SITE_OTHER): Payer: Medicare Other | Admitting: Orthopedic Surgery

## 2012-12-30 ENCOUNTER — Encounter: Payer: Self-pay | Admitting: Orthopedic Surgery

## 2012-12-30 VITALS — BP 115/69 | Ht 67.0 in | Wt 189.0 lb

## 2012-12-30 DIAGNOSIS — R160 Hepatomegaly, not elsewhere classified: Secondary | ICD-10-CM

## 2012-12-30 DIAGNOSIS — Z7901 Long term (current) use of anticoagulants: Secondary | ICD-10-CM

## 2012-12-30 DIAGNOSIS — I4891 Unspecified atrial fibrillation: Secondary | ICD-10-CM

## 2012-12-30 DIAGNOSIS — M1710 Unilateral primary osteoarthritis, unspecified knee: Secondary | ICD-10-CM

## 2012-12-30 DIAGNOSIS — Z8739 Personal history of other diseases of the musculoskeletal system and connective tissue: Secondary | ICD-10-CM

## 2012-12-30 NOTE — Progress Notes (Signed)
Patient ID: DUNBAR BURAS, male   DOB: 28-Oct-1940, 72 y.o.   MRN: [id number]  Chief Complaint  Patient presents with  . 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 Treated with injection in the hospital bracing currently on a walker  Review of systems hip pain resolved  He has a previous patellectomy incision small joint effusion knee flexion 115 knee stability normal motor exam normal with mild quad weakness distal pulses intact  CPPD disease with arthritis  Not a great surgical candidate he wants to try an injection in a hinged knee brace  Knee  Injection Procedure Note  Pre-operative Diagnosis: left knee oa  Post-operative Diagnosis: same  Indications: pain  Anesthesia: ethyl chloride   Procedure Details   Verbal consent was obtained for the procedure. Time out was completed.The joint was prepped with alcohol, followed by  Ethyl chloride spray and A 20 gauge needle was inserted into the knee via lateral approach; 4ml 1% lidocaine and 1 ml of depomedrol  was then injected into the joint . The needle was removed and the area cleansed and dressed.  Complications:  None; patient tolerated the procedure well.

## 2012-12-30 NOTE — Patient Instructions (Signed)
You have received a steroid shot. 15% of patients experience increased pain at the injection site with in the next 24 hours. This is best treated with ice and tylenol extra strength 2 tabs every 8 hours. If you are still having pain please call the office.   Brace as needed

## 2013-01-03 ENCOUNTER — Encounter (HOSPITAL_COMMUNITY): Payer: Self-pay

## 2013-01-03 ENCOUNTER — Ambulatory Visit (HOSPITAL_COMMUNITY)
Admission: RE | Admit: 2013-01-03 | Discharge: 2013-01-03 | Disposition: A | Discharge status: Home / Self Care | Payer: Medicare Other | Source: Ambulatory Visit | Attending: Internal Medicine | Admitting: Internal Medicine

## 2013-01-03 DIAGNOSIS — Z85830 Personal history of malignant neoplasm of bone: Secondary | ICD-10-CM | POA: Insufficient documentation

## 2013-01-03 DIAGNOSIS — R160 Hepatomegaly, not elsewhere classified: Secondary | ICD-10-CM | POA: Insufficient documentation

## 2013-01-03 DIAGNOSIS — Q619 Cystic kidney disease, unspecified: Secondary | ICD-10-CM | POA: Insufficient documentation

## 2013-01-03 DIAGNOSIS — Z87898 Personal history of other specified conditions: Secondary | ICD-10-CM | POA: Insufficient documentation

## 2013-01-03 LAB — POCT I-STAT CREATININE: Creatinine, Ser: 1.3 mg/dL (ref 0.50–1.35)

## 2013-01-03 MED ORDER — IOHEXOL 300 MG/ML  SOLN
100.0000 mL | Freq: Once | INTRAMUSCULAR | Status: AC | PRN
  Administered 2013-01-03: 100 mL via INTRAVENOUS

## 2013-01-05 NOTE — Progress Notes (Signed)
[name], MD [address] [place] [address]  Follicular lymphoma - Plan: CBC with Differential, Comprehensive metabolic panel, Lactate dehydrogenase, Sedimentation rate, Beta 2 microglobuline, serum, CBC with Differential, Comprehensive metabolic panel, Lactate dehydrogenase, Sedimentation rate, Beta 2 microglobuline, serum, sodium chloride 0.9 % injection 10 mL, heparin lock flush 100 unit/mL  CURRENT THERAPY: Surveillance per NCCN guidelines  INTERVAL HISTORY: Charles [name] 72 y.o. 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  He is S/P Bendamustine/Rituxan finishing 6 cycles of 11/05/2010 followed by 4 maintenance Rituxan infusion stopping after the 4th infusion on 12/09/2011 secondary to complications. Last PET scan in April 2014 did not show any evidence of lymphoma.  Charles [name] was in the [facility] from 10/26/2012- 10/28/2012 for CHF exacerbation that was well managed.  He has since followed-up with Cardiology, Dr. Diona Browner.  From a cardiac standpoint, he seem stable.   I personally reviewed and went over laboratory results with the patient.  Last CBC shows a WBC of 5.5, Hgb 11.1, and platelet count of 191,000.  His iron studies are solid as of July 2014 with serum iron of 68. TIBC 325, Saturation of 21%, and ferritin of 77.  We reviewed NCCN guidelines for follicular lymphoma.  The NCCN guidelines for surveillance of Follicular Lymphoma are:  A. H+P every 3-6 months x 5 years and then annually or as indicated  B. Labs every 3-6 months x 5 years and then annually or as indicated  C. Surveillance imaging no more than every 6 months x 2 years post-treatment (completed) and then no more than annually or as clinically indicated.  He underwent a CT scan of abdomen for some abdominal swelling the patient reports.  The full report  follows below, but the gist of that report is negative for any indication of malignancy.  I suspect the swelling was possibly from from some stool in the colon or something benign like that.  He denies any hematologic complaints including B symptoms such as fevers chills, night sweats, unintentional weight loss, etc.   The nurse reports that his port has not been providing blood return for a number of months.  Dye study was performed showing fibrin sheath or plug.  Alteplase has been tried and unsuccessful.  As a result, if blood is not provided today, we will have the port removed by Dr. Malvin Johns who placed the port.   He notes some muscle cramping.  I will therefore check a K+ level today with his lab work as he is on Lasix.  Past Medical History  Diagnosis Date  . Essential hypertension, benign   . Atrial fibrillation 10/2008  . 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 Peptic ulcer disease   . Degenerative joint disease     Knees and shoulders  . Pneumonia 2010  . Allergic rhinitis   . Anxiety   . Drug abuse   . Tobacco abuse     50 pack years  . Mitral regurgitation     Moderate to severe May 2014  . 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 Basal cell carcinoma     has TOBACCO ABUSE; INSOMNIA, CHRONIC; Essential hypertension, benign; GASTRIC ULCER, HX OF; Atrial fibrillation; Chronic anticoagulation; Follicular lymphoma; Hyperlipidemia; Calcium pyrophosphate crystal disease; Back pain; Chronic diastolic heart failure; Nonischemic cardiomyopathy; Mitral regurgitation;  and OA (osteoarthritis) of knee on his problem list.     is allergic to statins.  Mr. Betzold does not currently have medications on file.  Past Surgical History  Procedure Laterality Date  . Neck lesion biopsy  June 24, 2010    Follicular lymphoma  . Skin cancer excision      under left breast; variously described as melanoma and basal cell  . Rotator cuff  repair      right  . Patella fracture surgery  1975  . Hematoma evacuation      after melanoma removal  . Portacath placement  07/12/2010  . Colonoscopy  2009    Also underwent an EGD; patient reports no significant findings  . Bone marrow aspiration  06/2012  . Bone marrow biopsy  06/2012  . Fungal infection      Denies any headaches, dizziness, double vision, fevers, chills, night sweats, nausea, vomiting, diarrhea, constipation, chest pain, heart palpitations, shortness of breath, blood in stool, black tarry stool, urinary pain, urinary burning, urinary frequency, hematuria.   PHYSICAL EXAMINATION  ECOG PERFORMANCE STATUS: 1 - Symptomatic but completely ambulatory  Filed Vitals:   01/06/13 1100  BP: 121/67  Pulse: 53  Temp: 97 F (36.1 C)  Resp: 18    GENERAL:alert, no distress, well nourished, smiling and chronically-ill appearing SKIN: skin color, texture, turgor are normal, no rashes or significant lesions HEAD: Normocephalic, No masses, lesions, tenderness or abnormalities EYES: normal, PERRLA, EOMI, Conjunctiva are pink and non-injected EARS: External ears normal OROPHARYNX:mucous membranes are moist  NECK: supple, no adenopathy, thyroid normal size, non-tender, without nodularity, no stridor, non-tender, trachea midline LYMPH:  no palpable lymphadenopathy, no hepatosplenomegaly BREAST:breasts appear normal, no suspicious masses, no skin or nipple changes or axillary nodes LUNGS: clear to auscultation and percussion HEART: regular rate & rhythm, no murmurs, no gallops, S1 normal and S2 normal ABDOMEN:abdomen soft, non-tender, normal bowel sounds, no masses or organomegaly and no hepatosplenomegaly BACK: Back symmetric, no curvature., No CVA tenderness EXTREMITIES:less then 2 second capillary refill, no joint deformities, effusion, or inflammation, no edema, no skin discoloration, no clubbing, no cyanosis, left knee brace in place NEURO: alert & oriented x 3 with fluent  speech, no focal motor/sensory deficits, gait normal    LABORATORY DATA: CBC    Component Value Date/Time   WBC 5.5 10/27/2012 0419   RBC 3.87* 10/27/2012 0419   HGB 11.1* 10/27/2012 0419   HCT 34.1* 10/27/2012 0419   PLT 191 10/27/2012 0419   MCV 88.1 10/27/2012 0419   MCH 28.7 10/27/2012 0419   MCHC 32.6 10/27/2012 0419   RDW 17.4* 10/27/2012 0419   LYMPHSABS 0.8 10/26/2012 1126   MONOABS 0.5 10/26/2012 1126   EOSABS 0.3 10/26/2012 1126   BASOSABS 0.1 10/26/2012 1126      Chemistry      Component Value Date/Time   NA 137 10/30/2012 1112   K 4.8 10/30/2012 1112   CL 99 10/30/2012 1112   CO2 31 10/30/2012 1112   BUN 33* 10/30/2012 1112   CREATININE 1.30 01/03/2013 0912   CREATININE 1.68* 10/30/2012 1112      Component Value Date/Time   CALCIUM 9.8 10/30/2012 1112   ALKPHOS 81 10/26/2012 1126   AST 17 10/26/2012 1126   ALT 12 10/26/2012 1126   BILITOT 0.5 10/26/2012 1126      RADIOLOGY:  01/03/2013  CLINICAL DATA: Hepatomegaly. History of follicular lymphoma and  bone carcinoma in 2013.  EXAM:  CT ABDOMEN WITH CONTRAST  TECHNIQUE:  Multidetector CT imaging of the abdomen was performed using the  standard protocol following bolus administration of intravenous  contrast.  CONTRAST: OMNIPAQUE IOHEXOL 300 MG/ML SOLN  COMPARISON: 02/02/2005  FINDINGS:  Minor subsegmental atelectasis is noted at the lung bases. The heart  is mildly enlarged.  There is a small transient hepatic attenuation difference at the  dome of the left lobe of the liver, an incidental finding. The liver  is otherwise unremarkable. Normal spleen, gallbladder and pancreas.  No bile duct dilation.  There is mild nodular adrenal thickening that is stable reflecting  either nodular hyperplasia common small adenomas or a combination.  Bilateral renal cortical thinning. There are 2 discrete cyst arising  from the midpole of the left kidney that have increased in size from  the prior study, the largest measuring 2.5 cm. Other smaller  left  renal lesions are noted better also likely cysts but too small to  fully characterize. No hydronephrosis.  No enlarged lymph nodes. There are no abnormal fluid collections.  The bowel is unremarkable. There is partly calcified atherosclerotic  plaque along the abdominal aorta and its branch vessels.  Degenerative changes are noted throughout the visualized spine. No  osteoblastic or osteolytic lesions.  IMPRESSION:  1. No acute findings.  2. No hepatomegaly. Liver is unremarkable except for a transient  hepatic attenuation difference at the dome of the left lobe.  3. Bilateral renal cortical thinning. Left renal cysts and smaller  bilateral hypo attenuating renal lesions that are also likely cysts.  4. Nodular thickening of the adrenal glands. This has been stable  since the prior exam and needs no additional evaluation.  5. Atherosclerotic changes of the aorta. Degenerative changes  throughout the visualized spine.  6. No evidence of neoplastic disease.  Electronically Signed  By: Amie Portland M.D.  On: 01/03/2013 09:52    ASSESSMENT:  1. 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  He is S/P Bendamustine/Rituxan finishing 6 cycles of 11/05/2010 followed by 4 maintenance Rituxan infusion stopping after the 4th infusion on 12/09/2011 secondary to complications. Last PET scan in April 2014 did not show any evidence of lymphoma. 2. Muscle cramping, will check K+ level today 3. Lack of blood return from port, if none today, will refer to Dr. Malvin Johns for port removal.  Patient Active Problem List   Diagnosis Date Noted  . OA (osteoarthritis) of knee 12/30/2012  . Mitral regurgitation 10/27/2012  . Chronic diastolic heart failure 10/26/2012  . Calcium pyrophosphate crystal disease 09/07/2012  . Back pain 09/07/2012  . Hyperlipidemia 04/17/2011  . 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 Atrial fibrillation  06/21/2010  . Chronic anticoagulation 06/21/2010  . Nonischemic cardiomyopathy 02/21/2009  . TOBACCO ABUSE 11/22/2008  . INSOMNIA, CHRONIC 11/22/2008  . GASTRIC ULCER, HX OF 11/22/2008  . Essential hypertension, benign 11/08/2008     PLAN:  1. I personally reviewed and went over laboratory results with the patient. 2. Labs today: CBC diff, CMET, LDH, ESR, B2M 3. Labs in 3-4 months: CBC diff, CMET, LDH, ESR, B2M 4. NCCN guidelines reviewed.  5. Surveillance per NCCN guidelines 6. We can perform scans annually or as clinically indicated per NCCN guidelines.  7. I personally reviewed and went over radiographic studies with the patient. 8. Refer the patient to Dr. Malvin Johns for port-a-cath removal.  9. He received influenza vaccine from PCP. 10. Return in 3-4 months for follow-up.   THERAPY PLAN:  Will continue surveillance per  NCCN guidelines.  The NCCN guidelines for surveillance of Follicular Lymphoma are:  A. H+P every 3-6 months x 5 years and then annually or as indicated  B. Labs every 3-6 months x 5 years and then annually or as indicated  C. Surveillance imaging no more than every 6 months x 2 years post-treatment and then no more than annually or as clinically indicated.   All questions were answered. The patient knows to call the clinic with any problems, questions or concerns. We can certainly see the patient much sooner if necessary.   Patient and plan discussed with Dr. Alla German and he is in agreement with the aforementioned.    KEFALAS,THOMAS

## 2013-01-06 ENCOUNTER — Encounter (HOSPITAL_COMMUNITY): Payer: Medicare Other | Attending: Oncology | Admitting: Oncology

## 2013-01-06 ENCOUNTER — Ambulatory Visit (INDEPENDENT_AMBULATORY_CARE_PROVIDER_SITE_OTHER): Payer: Medicare Other | Admitting: *Deleted

## 2013-01-06 ENCOUNTER — Telehealth: Payer: Self-pay | Admitting: Orthopedic Surgery

## 2013-01-06 ENCOUNTER — Encounter (HOSPITAL_COMMUNITY): Payer: Self-pay | Admitting: Oncology

## 2013-01-06 VITALS — BP 121/67 | HR 53 | Temp 97.0°F | Resp 18 | Wt 190.7 lb

## 2013-01-06 DIAGNOSIS — C8299 Follicular lymphoma, unspecified, extranodal and solid organ sites: Secondary | ICD-10-CM

## 2013-01-06 DIAGNOSIS — C8290 Follicular lymphoma, unspecified, unspecified site: Secondary | ICD-10-CM

## 2013-01-06 DIAGNOSIS — I4891 Unspecified atrial fibrillation: Secondary | ICD-10-CM

## 2013-01-06 DIAGNOSIS — Z7901 Long term (current) use of anticoagulants: Secondary | ICD-10-CM

## 2013-01-06 LAB — CBC WITH DIFFERENTIAL/PLATELET
Basophils Absolute: 0 10*3/uL (ref 0.0–0.1)
Basophils Relative: 1 % (ref 0–1)
Eosinophils Relative: 6 % — ABNORMAL HIGH (ref 0–5)
HCT: 39.2 % (ref 39.0–52.0)
Lymphocytes Relative: 15 % (ref 12–46)
MCH: 29 pg (ref 26.0–34.0)
MCHC: 32.9 g/dL (ref 30.0–36.0)
MCV: 88.1 fL (ref 78.0–100.0)
Monocytes Absolute: 0.4 10*3/uL (ref 0.1–1.0)
Monocytes Relative: 7 % (ref 3–12)
Platelets: 200 10*3/uL (ref 150–400)
RDW: 14.8 % (ref 11.5–15.5)
WBC: 6.4 10*3/uL (ref 4.0–10.5)

## 2013-01-06 LAB — SEDIMENTATION RATE: Sed Rate: 22 mm/hr — ABNORMAL HIGH (ref 0–16)

## 2013-01-06 LAB — COMPREHENSIVE METABOLIC PANEL
AST: 16 U/L (ref 0–37)
Albumin: 4.1 g/dL (ref 3.5–5.2)
CO2: 33 mEq/L — ABNORMAL HIGH (ref 19–32)
Calcium: 9.9 mg/dL (ref 8.4–10.5)
Creatinine, Ser: 1.15 mg/dL (ref 0.50–1.35)
GFR calc Af Amer: 71 mL/min — ABNORMAL LOW (ref >90)
GFR calc non Af Amer: 61 mL/min — ABNORMAL LOW (ref >90)

## 2013-01-06 LAB — LACTATE DEHYDROGENASE: LDH: 210 U/L (ref 94–250)

## 2013-01-06 MED ORDER — SODIUM CHLORIDE 0.9 % IJ SOLN
10.0000 mL | INTRAMUSCULAR | Status: DC | PRN
  Administered 2013-01-06: 10 mL via INTRAVENOUS

## 2013-01-06 MED ORDER — HEPARIN SOD (PORK) LOCK FLUSH 100 UNIT/ML IV SOLN
500.0000 [IU] | Freq: Once | INTRAVENOUS | Status: AC
  Administered 2013-01-06: 500 [IU] via INTRAVENOUS
  Filled 2013-01-06: qty 5

## 2013-01-06 NOTE — Telephone Encounter (Signed)
Returned call to patient and advised if he could not wear the brace then he could bring it back, and I would ask the [facility] brace Rep, if it could be returned. Also, he asked about a knee sleeve, and I advised him he could try [address] or [address].

## 2013-01-06 NOTE — Progress Notes (Signed)
Charles [name] presented for Portacath access and flush. Proper placement of portacath confirmed by CXR. Portacath located left chest wall accessed with  H 20 needle. No blood return and patient to see Charles [name] for removal Portacath flushed with 20ml NS and 500U/23ml Heparin and needle removed intact. Procedure without incident. Patient tolerated procedure well.  Charles [name] presented for labwork. Labs per MD order drawn via Peripheral Line 23 gauge needle inserted in right AC  Good blood return present. Procedure without incident.  Needle removed intact. Patient tolerated procedure well.

## 2013-01-06 NOTE — Telephone Encounter (Signed)
Received call, voice message, from patient, with questions regarding the brace which patient received at office visit 12/30/12.  Note indicates:  " Not a great surgical candidate he wants to try an injection in a hinged knee brace "  - patient relates that the brace is not a good fit, that it slides down "from the top", and that it caused ankle to swell; therefore, he has not been wearing it. I called back to patient after receiving message, and spoke with wife, who volunteered the same information. Patient does not have a follow up appointment scheduled. Contact [employer] brace representative? Schedule appointment?  Please advise - patient ph# is [phone].

## 2013-01-06 NOTE — Patient Instructions (Signed)
[facility] Discharge Instructions  RECOMMENDATIONS MADE BY THE CONSULTANT AND ANY TEST RESULTS WILL BE SENT TO YOUR REFERRING PHYSICIAN.  EXAM FINDINGS BY THE PHYSICIAN TODAY AND SIGNS OR SYMPTOMS TO REPORT TO CLINIC OR PRIMARY PHYSICIAN: Exam and findings as discussed by Dellis Anes, PA-C.  Your scan was ok.  Report fevers, sweats, infections, unexplained weight loss or other problems. Port is not working well so you can get your port removed.  MEDICATIONS PRESCRIBED:  none  INSTRUCTIONS/FOLLOW-UP: Blood work today and again in 3 - 4 months and to be seen in clinic in 3 - 4 months.  Thank you for choosing [facility] to provide your oncology and hematology care.  To afford each patient quality time with our providers, please arrive at least 15 minutes before your scheduled appointment time.  With your help, our goal is to use those 15 minutes to complete the necessary work-up to ensure our physicians have the information they need to help with your evaluation and healthcare recommendations.    Effective January 1st, 2014, we ask that you re-schedule your appointment with our physicians should you arrive 10 or more minutes late for your appointment.  We strive to give you quality time with our providers, and arriving late affects you and other patients whose appointments are after yours.    Again, thank you for choosing [facility].  Our hope is that these requests will decrease the amount of time that you wait before being seen by our physicians.       _____________________________________________________________  Should you have questions after your visit to [facility], please contact our office at [phone] between the hours of 8:30 a.m. and 5:00 p.m.  Voicemails left after 4:30 p.m. will not be returned until the following business day.  For prescription refill requests, have your pharmacy contact our office with your  prescription refill request.

## 2013-01-07 ENCOUNTER — Other Ambulatory Visit (HOSPITAL_COMMUNITY): Payer: Self-pay

## 2013-01-07 ENCOUNTER — Telehealth (HOSPITAL_COMMUNITY): Payer: Self-pay | Admitting: Oncology

## 2013-01-07 LAB — BETA 2 MICROGLOBULIN, SERUM: Beta-2 Microglobulin: 3.14 mg/L — ABNORMAL HIGH (ref 1.01–1.73)

## 2013-01-10 ENCOUNTER — Telehealth: Payer: Self-pay

## 2013-01-10 NOTE — Telephone Encounter (Signed)
Yes, should be able to hold Coumadin for procedure. Typically 4-5 days prior.

## 2013-01-10 NOTE — Telephone Encounter (Signed)
Dr. Malvin Johns Office schedule patient for surgery (port a cath) removal sched 10.27.14.  They will need warfarin held.  Please advise.   Fax [phone]

## 2013-01-10 NOTE — Telephone Encounter (Signed)
Please advise 

## 2013-01-11 ENCOUNTER — Encounter (HOSPITAL_COMMUNITY): Payer: Self-pay | Admitting: Pharmacy Technician

## 2013-01-11 NOTE — Consult Note (Signed)
NAME:  Charles [name], Charles [name] NO.:  [id number]  MEDICAL RECORD NO.:  [id number]  LOCATION:  PERIO                         FACILITY:  [facility]  PHYSICIAN:  Barbaraann Barthel, M.D. DATE OF BIRTH:  17-Jun-1940  DATE OF CONSULTATION:  01/10/2013 DATE OF DISCHARGE:                                CONSULTATION   HISTORY OF PRESENT ILLNESS:  This is a 72 year old white male who has finished treatment for non-Hodgkin's lymphoma and he was referred for removal of his Port-A-Cath that was placed in 2012.  PHYSICAL EXAMINATION:  HEENT:  Head is normocephalic.  Eyes, extraocular movements are intact.  Pupils are equal and reactive to light and accommodation. NECK:  No bruits are appreciated.  No adenopathy. CHEST:  His heart is irregular.  Lungs are clear. ABDOMEN:  Soft.  No visceromegaly.  No hernias. RECTAL:  Deferred. EXTREMITIES:  The patient has had multiple previous orthopedic procedures on his right shoulder and his right rotator cuff as well as having a left removal of his patella in the 1980s.  PAST MEDICAL HISTORY:  Medically, he has a history of atrial fib and hypertension and multiple arthritic complaints.  Past surgeries have included neck node biopsy in 2012, which diagnosed non-Hodgkin's lymphoma.  He has had an artificial joint placed in his right shoulder and left and right rotator cuff surgery in 2010.  He also had a basal cell removed from his left chest.  He had a Port-A-Cath inserted in 2012, and he had his left patella removed in the 1980s.  For medications, please check medication list.  He quit smoking in December of this year.  He does not abuse alcohol.  PHYSICAL EXAMINATION:  VITAL SIGNS:  He is 5 feet 7 inches, weighs 190 pounds, temperature is 98.2, pulse rate is 68 and irregular, respirations are 14, blood pressure 130/80.  REVIEW OF SYSTEMS:  He has no history of migraines or seizures. ENDOCRINE:  No history of diabetes, thyroid disease, or  adrenal problems.  CARDIOPULMONARY:  History of atrial fib for which he takes Coumadin and is being followed by the [facility]. MUSCULOSKELETAL:  As stated, he has multiple problems with his right upper extremity with artificial joint to shoulder and right rotator cuff, and on his left knee with left patella removed in the 1980s.  GI: No history of hepatitis, constipation, diarrhea, bright red rectal bleeding, or black tarry stools.  No unexplained weight loss.  He had a colonoscopy and an upper GI endoscopy in 2008.  GU:  No history of frequency or dysuria.  He has had a past history of kidney stones in the past.  REVIEW OF HISTORY AND PHYSICAL:  Therefore, Charles [name] is a 72 year old white male who will have his Port-A-Cath removed as this is no longer functioning and it has and served its purpose as he has completed his treatment.  We will coordinate this care with the Cardiology team who will advice him as to his determination of his Coumadin preoperatively and re-instigation of his Coumadin after surgery.  We will also do this in the operating room with anesthesia present due to his cardiac history.     Barbaraann Barthel, M.D.  WB/MEDQ  D:  01/10/2013  T:  01/11/2013  Job:  [id number]  cc:   Oncology Department  Jonelle Sidle, MD

## 2013-01-11 NOTE — Telephone Encounter (Signed)
.  left message to have patient return my call.  

## 2013-01-11 NOTE — Telephone Encounter (Signed)
Advised Dr Bertram Savin nurse sandra the instructions Dr. Diona Browner advised, she noted that she will inform the pt and dr bradford as advised

## 2013-01-12 ENCOUNTER — Ambulatory Visit (INDEPENDENT_AMBULATORY_CARE_PROVIDER_SITE_OTHER): Payer: Medicare Other | Admitting: *Deleted

## 2013-01-12 DIAGNOSIS — I4891 Unspecified atrial fibrillation: Secondary | ICD-10-CM

## 2013-01-12 DIAGNOSIS — Z7901 Long term (current) use of anticoagulants: Secondary | ICD-10-CM

## 2013-01-12 LAB — POCT INR: INR: 2.6

## 2013-01-13 ENCOUNTER — Encounter (HOSPITAL_COMMUNITY)
Admission: RE | Admit: 2013-01-13 | Discharge: 2013-01-13 | Disposition: A | Discharge status: Home / Self Care | Payer: Medicare Other | Source: Ambulatory Visit | Attending: General Surgery | Admitting: General Surgery

## 2013-01-13 NOTE — Patient Instructions (Signed)
    OLUWATOSIN HIGGINSON  01/13/2013   Your procedure is scheduled on:  01/17/2013  Report to [facility] at  1030  AM.  Call this number if you have problems the morning of surgery: [phone]   Remember:   Do not eat food or drink liquids after midnight.   Take these medicines the morning of surgery with A SIP OF WATER:  Xanax, lanoxin, cardiazem, norco, metoprolol, prilosec, zoloft, ultram. Take your spiriva and proair before you come.   Do not wear jewelry, make-up or nail polish.  Do not wear lotions, powders, or perfumes.   Do not shave 48 hours prior to surgery. Men may shave face and neck.  Do not bring valuables to the hospital.  [facility] is not responsible for any belongings or valuables.               Contacts, dentures or bridgework may not be worn into surgery.  Leave suitcase in the car. After surgery it may be brought to your room.  For patients admitted to the hospital, discharge time is determined by your  treatment team.               Patients discharged the day of surgery will not be allowed to drive home.  Name and phone number of your driver: family  Special Instructions: Shower using CHG 2 nights before surgery and the night before surgery.  If you shower the day of surgery use CHG.  Use special wash - you have one bottle of CHG for all showers.  You should use approximately 1/3 of the bottle for each shower.   Please read over the following fact sheets that you were given: Pain Booklet, Coughing and Deep Breathing, Surgical Site Infection Prevention, Anesthesia Post-op Instructions and Care and Recovery After Surgery PATIENT INSTRUCTIONS POST-ANESTHESIA  IMMEDIATELY FOLLOWING SURGERY:  Do not drive or operate machinery for the first twenty four hours after surgery.  Do not make any important decisions for twenty four hours after surgery or while taking narcotic pain medications or sedatives.  If you develop intractable nausea and vomiting or a severe headache  please notify your doctor immediately.  FOLLOW-UP:  Please make an appointment with your surgeon as instructed. You do not need to follow up with anesthesia unless specifically instructed to do so.  WOUND CARE INSTRUCTIONS (if applicable):  Keep a dry clean dressing on the anesthesia/puncture wound site if there is drainage.  Once the wound has quit draining you may leave it open to air.  Generally you should leave the bandage intact for twenty four hours unless there is drainage.  If the epidural site drains for more than 36-48 hours please call the anesthesia department.  QUESTIONS?:  Please feel free to call your physician or the hospital operator if you have any questions, and they will be happy to assist you.

## 2013-01-14 ENCOUNTER — Encounter (HOSPITAL_COMMUNITY): Payer: Self-pay

## 2013-01-14 ENCOUNTER — Encounter (HOSPITAL_COMMUNITY)
Admission: RE | Admit: 2013-01-14 | Discharge: 2013-01-14 | Disposition: A | Discharge status: Home / Self Care | Payer: Medicare Other | Source: Ambulatory Visit | Attending: General Surgery | Admitting: General Surgery

## 2013-01-14 HISTORY — DX: Chronic obstructive pulmonary disease, unspecified: J44.9

## 2013-01-14 NOTE — Pre-Procedure Instructions (Signed)
Spoke with Bonita Quin at Dr Daisy Blossom office after reviewing patients labs from 01/06/2013. Dr Malvin Johns is ok with using these labs for surgery.

## 2013-01-17 ENCOUNTER — Encounter (HOSPITAL_COMMUNITY): Payer: Self-pay | Admitting: *Deleted

## 2013-01-17 ENCOUNTER — Ambulatory Visit (HOSPITAL_COMMUNITY)
Admission: RE | Admit: 2013-01-17 | Discharge: 2013-01-17 | Disposition: A | Discharge status: Home / Self Care | Payer: Medicare Other | Source: Ambulatory Visit | Attending: General Surgery | Admitting: General Surgery

## 2013-01-17 ENCOUNTER — Ambulatory Visit (HOSPITAL_COMMUNITY): Payer: Medicare Other | Admitting: Anesthesiology

## 2013-01-17 ENCOUNTER — Encounter (HOSPITAL_COMMUNITY)
Admission: RE | Disposition: A | Discharge status: Home / Self Care | Payer: Self-pay | Source: Ambulatory Visit | Attending: General Surgery

## 2013-01-17 ENCOUNTER — Encounter (HOSPITAL_COMMUNITY): Payer: Medicare Other | Admitting: Anesthesiology

## 2013-01-17 DIAGNOSIS — I10 Essential (primary) hypertension: Secondary | ICD-10-CM | POA: Insufficient documentation

## 2013-01-17 DIAGNOSIS — Z452 Encounter for adjustment and management of vascular access device: Secondary | ICD-10-CM | POA: Insufficient documentation

## 2013-01-17 DIAGNOSIS — J4489 Other specified chronic obstructive pulmonary disease: Secondary | ICD-10-CM | POA: Insufficient documentation

## 2013-01-17 DIAGNOSIS — Z01810 Encounter for preprocedural cardiovascular examination: Secondary | ICD-10-CM | POA: Insufficient documentation

## 2013-01-17 DIAGNOSIS — Z79899 Other long term (current) drug therapy: Secondary | ICD-10-CM | POA: Insufficient documentation

## 2013-01-17 DIAGNOSIS — J449 Chronic obstructive pulmonary disease, unspecified: Secondary | ICD-10-CM | POA: Insufficient documentation

## 2013-01-17 HISTORY — PX: PORT-A-CATH REMOVAL: SHX5289

## 2013-01-17 LAB — PROTIME-INR
INR: 1.17 (ref 0.00–1.49)
Prothrombin Time: 14.7 seconds (ref 11.6–15.2)

## 2013-01-17 SURGERY — REMOVAL PORT-A-CATH
Anesthesia: Monitor Anesthesia Care | Site: Chest | Laterality: Left | Wound class: Clean

## 2013-01-17 MED ORDER — SODIUM CHLORIDE 0.9 % IR SOLN
Status: DC | PRN
  Administered 2013-01-17: 1000 mL

## 2013-01-17 MED ORDER — WATER FOR IRRIGATION, STERILE IR SOLN
Status: DC | PRN
  Administered 2013-01-17: 2000 mL

## 2013-01-17 MED ORDER — FENTANYL CITRATE 0.05 MG/ML IJ SOLN: 25.0000 ug | INTRAMUSCULAR | Status: DC | PRN

## 2013-01-17 MED ORDER — LACTATED RINGERS IV SOLN
INTRAVENOUS | Status: DC | PRN
  Administered 2013-01-17: 12:00:00 via INTRAVENOUS

## 2013-01-17 MED ORDER — MIDAZOLAM HCL 2 MG/2ML IJ SOLN
1.0000 mg | INTRAMUSCULAR | Status: DC | PRN
  Administered 2013-01-17: 2 mg via INTRAVENOUS

## 2013-01-17 MED ORDER — PROPOFOL 10 MG/ML IV BOLUS
INTRAVENOUS | Status: AC
  Filled 2013-01-17: qty 20

## 2013-01-17 MED ORDER — FENTANYL CITRATE 0.05 MG/ML IJ SOLN
INTRAMUSCULAR | Status: AC
  Filled 2013-01-17: qty 2

## 2013-01-17 MED ORDER — FENTANYL CITRATE 0.05 MG/ML IJ SOLN
INTRAMUSCULAR | Status: DC | PRN
  Administered 2013-01-17: 25 ug via INTRAVENOUS

## 2013-01-17 MED ORDER — LIDOCAINE HCL (PF) 1 % IJ SOLN
INTRAMUSCULAR | Status: AC
  Filled 2013-01-17: qty 5

## 2013-01-17 MED ORDER — LACTATED RINGERS IV SOLN
INTRAVENOUS | Status: DC
  Administered 2013-01-17: 1000 mL via INTRAVENOUS

## 2013-01-17 MED ORDER — ONDANSETRON HCL 4 MG/2ML IJ SOLN: 4.0000 mg | Freq: Once | INTRAMUSCULAR | Status: DC | PRN

## 2013-01-17 MED ORDER — LIDOCAINE HCL (PF) 1 % IJ SOLN
INTRAMUSCULAR | Status: AC
  Filled 2013-01-17: qty 30

## 2013-01-17 MED ORDER — PROPOFOL INFUSION 10 MG/ML OPTIME
INTRAVENOUS | Status: DC | PRN
  Administered 2013-01-17: 50 ug/kg/min via INTRAVENOUS

## 2013-01-17 MED ORDER — CEFAZOLIN SODIUM-DEXTROSE 2-3 GM-% IV SOLR
2.0000 g | Freq: Once | INTRAVENOUS | Status: AC
  Administered 2013-01-17: 2 g via INTRAVENOUS
  Filled 2013-01-17: qty 50

## 2013-01-17 MED ORDER — FENTANYL CITRATE 0.05 MG/ML IJ SOLN
25.0000 ug | INTRAMUSCULAR | Status: AC
  Administered 2013-01-17 (×2): 25 ug via INTRAVENOUS

## 2013-01-17 MED ORDER — BACITRACIN-NEOMYCIN-POLYMYXIN 400-5-5000 EX OINT
TOPICAL_OINTMENT | CUTANEOUS | Status: DC | PRN
  Administered 2013-01-17: 1 via TOPICAL

## 2013-01-17 MED ORDER — MIDAZOLAM HCL 2 MG/2ML IJ SOLN
INTRAMUSCULAR | Status: AC
  Filled 2013-01-17: qty 2

## 2013-01-17 MED ORDER — LIDOCAINE HCL (PF) 1 % IJ SOLN
INTRAMUSCULAR | Status: DC | PRN
  Administered 2013-01-17: 4 mL

## 2013-01-17 SURGICAL SUPPLY — 45 items
BAG HAMPER (MISCELLANEOUS) ×2 IMPLANT
BLADE SURG 15 STRL LF DISP TIS (BLADE) ×2 IMPLANT
BLADE SURG 15 STRL SS (BLADE) ×2
CLOTH BEACON ORANGE TIMEOUT ST (SAFETY) ×2 IMPLANT
COVER LIGHT HANDLE STERIS (MISCELLANEOUS) ×4 IMPLANT
DRAPE LAPAROTOMY TRNSV 102X78 (DRAPE) ×2 IMPLANT
DRSG TEGADERM 2-3/8X2-3/4 SM (GAUZE/BANDAGES/DRESSINGS) ×2 IMPLANT
ELECT REM PT RETURN 9FT ADLT (ELECTROSURGICAL) ×2
ELECTRODE REM PT RTRN 9FT ADLT (ELECTROSURGICAL) ×1 IMPLANT
GAUZE SPONGE 4X4 16PLY XRAY LF (GAUZE/BANDAGES/DRESSINGS) ×2 IMPLANT
GLOVE BIOGEL PI IND STRL 7.0 (GLOVE) ×2 IMPLANT
GLOVE BIOGEL PI IND STRL 7.5 (GLOVE) ×1 IMPLANT
GLOVE BIOGEL PI INDICATOR 7.0 (GLOVE) ×2
GLOVE BIOGEL PI INDICATOR 7.5 (GLOVE) ×1
GLOVE SKINSENSE NS SZ7.0 (GLOVE) ×1
GLOVE SKINSENSE STRL SZ7.0 (GLOVE) ×1 IMPLANT
GLOVE SS BIOGEL STRL SZ 6.5 (GLOVE) ×1 IMPLANT
GLOVE SUPERSENSE BIOGEL SZ 6.5 (GLOVE) ×1
GOWN STRL REIN XL XLG (GOWN DISPOSABLE) ×4 IMPLANT
KIT BLADEGUARD II DBL (SET/KITS/TRAYS/PACK) ×2 IMPLANT
KIT ROOM TURNOVER APOR (KITS) ×2 IMPLANT
MANIFOLD NEPTUNE II (INSTRUMENTS) ×2 IMPLANT
MARKER SKIN DUAL TIP RULER LAB (MISCELLANEOUS) ×2 IMPLANT
NEEDLE HYPO 25X1 1.5 SAFETY (NEEDLE) ×2 IMPLANT
NS IRRIG 1000ML POUR BTL (IV SOLUTION) ×2 IMPLANT
PACK BASIC III (CUSTOM PROCEDURE TRAY) ×1
PACK SRG BSC III STRL LF ECLPS (CUSTOM PROCEDURE TRAY) ×1 IMPLANT
PAD ARMBOARD 7.5X6 YLW CONV (MISCELLANEOUS) ×2 IMPLANT
PENCIL HANDSWITCHING (ELECTRODE) ×2 IMPLANT
SET BASIN LINEN APH (SET/KITS/TRAYS/PACK) ×2 IMPLANT
SOL PREP PROV IODINE SCRUB 4OZ (MISCELLANEOUS) ×2 IMPLANT
SPONGE GAUZE 2X2 8PLY STRL LF (GAUZE/BANDAGES/DRESSINGS) ×2 IMPLANT
STRIP CLOSURE SKIN 1/4X3 (GAUZE/BANDAGES/DRESSINGS) ×2 IMPLANT
SUT SILK 2 0 (SUTURE) ×1
SUT SILK 2-0 18XBRD TIE 12 (SUTURE) ×1 IMPLANT
SUT VIC AB 3-0 SH 27 (SUTURE) ×1
SUT VIC AB 3-0 SH 27X BRD (SUTURE) ×1 IMPLANT
SUT VIC AB 4-0 PS2 27 (SUTURE) ×2 IMPLANT
SUT VIC AB 5-0 P-3 18X BRD (SUTURE) ×1 IMPLANT
SUT VIC AB 5-0 P3 18 (SUTURE) ×1
SUT VICRYL AB 3 0 TIES (SUTURE) ×2 IMPLANT
SYR BULB IRRIGATION 50ML (SYRINGE) ×2 IMPLANT
SYR CONTROL 10ML LL (SYRINGE) ×2 IMPLANT
WATER STERILE IRR 1000ML POUR (IV SOLUTION) ×4 IMPLANT
YANKAUER SUCT 12FT TUBE ARGYLE (SUCTIONS) ×2 IMPLANT

## 2013-01-17 NOTE — Anesthesia Preprocedure Evaluation (Signed)
Anesthesia Evaluation  Patient identified by MRN, date of birth, ID band Patient awake    Reviewed: Allergy & Precautions, H&P , NPO status , Patient's Chart, lab work & pertinent test results, reviewed documented beta blocker date and time   Airway Mallampati: I TM Distance: >3 FB     Dental  (+) Partial Upper, Edentulous Upper and Partial Lower   Pulmonary pneumonia -, resolved, COPD breath sounds clear to auscultation        Cardiovascular hypertension, Pt. on medications + dysrhythmias Atrial Fibrillation Rhythm:Irregular Rate:Normal     Neuro/Psych PSYCHIATRIC DISORDERS Anxiety    GI/Hepatic PUD,   Endo/Other    Renal/GU      Musculoskeletal   Abdominal   Peds  Hematology  (+) Blood dyscrasia (lymphoma), ,   Anesthesia Other Findings   Reproductive/Obstetrics                           Anesthesia Physical Anesthesia Plan  ASA: III  Anesthesia Plan: MAC   Post-op Pain Management:    Induction: Intravenous  Airway Management Planned: Simple Face Mask  Additional Equipment:   Intra-op Plan:   Post-operative Plan:   Informed Consent: I have reviewed the patients History and Physical, chart, labs and discussed the procedure including the risks, benefits and alternatives for the proposed anesthesia with the patient or authorized representative who has indicated his/her understanding and acceptance.     Plan Discussed with:   Anesthesia Plan Comments:         Anesthesia Quick Evaluation

## 2013-01-17 NOTE — Progress Notes (Signed)
8 yr. Old W. male for removal of porta cath after completion of chemo for non hodgkin's lymphoma.  Procedure and risks explained coumadin management as per cardiology's recommendations.  INR pending.  No clinical change in H&P, dict# [id number].  Filed Vitals:   01/17/13 1230  BP: 126/80  Pulse:   Temp:   Resp: 17  pulse 78, temp. 97.7, O2 sat 93% RA.

## 2013-01-17 NOTE — Transfer of Care (Signed)
Immediate Anesthesia Transfer of Care Note  Patient: Charles [name]  Procedure(s) Performed: Procedure(s): REMOVAL PORT-A-CATH (Left)  Patient Location: PACU  Anesthesia Type:MAC  Level of Consciousness: sedated and patient cooperative  Airway & Oxygen Therapy: Patient Spontanous Breathing and Patient connected to nasal cannula oxygen  Post-op Assessment: Report given to PACU RN and Post -op Vital signs reviewed and stable  Post vital signs: Reviewed and stable  Complications: No apparent anesthesia complications

## 2013-01-17 NOTE — Anesthesia Postprocedure Evaluation (Signed)
  Anesthesia Post-op Note  Patient: Charles [name]  Procedure(s) Performed: Procedure(s): REMOVAL PORT-A-CATH (Left)  Patient Location: PACU  Anesthesia Type:MAC  Level of Consciousness: sedated and patient cooperative  Airway and Oxygen Therapy: Patient Spontanous Breathing and Patient connected to nasal cannula oxygen  Post-op Pain: mild  Post-op Assessment: Post-op Vital signs reviewed, Patient's Cardiovascular Status Stable, Respiratory Function Stable, Patent Airway, No signs of Nausea or vomiting and Pain level controlled  Post-op Vital Signs: Reviewed and stable  Complications: No apparent anesthesia complications

## 2013-01-17 NOTE — Preoperative (Signed)
Beta Blockers   Reason not to administer Beta Blockers:Not Applicable 

## 2013-01-17 NOTE — Progress Notes (Signed)
Post op check  Awake and alert.  VSS Filed Vitals:   01/17/13 1345  BP:   Pulse: 70  Temp:   Resp: 17  BP 120/63, temp 97.5, O2 sat 100% on nasal canula.  Dressings dry and in tact.  Doing well, discharge and follow up arranged and pt. Knows to continue his usual dose of coumadin today and follow up with his cardiologist.

## 2013-01-17 NOTE — Brief Op Note (Signed)
01/17/2013  1:26 PM  PATIENT:  [name]  72 y.o. male  PRE-OPERATIVE DIAGNOSIS:  non-hodgkins lymphoma  POST-OPERATIVE DIAGNOSIS:  status post non-hodgkins lymphoma,   PROCEDURE:  Procedure(s): REMOVAL PORT-A-CATH (Left)  SURGEON:  Surgeon(s) and Role:    * Marlane Hatcher, MD - Primary  PHYSICIAN ASSISTANT:   ASSISTANTS: none   ANESTHESIA:   IV sedation  EBL:  Total I/O In: 700 [I.V.:700] Out: 5 [Blood:5]  BLOOD ADMINISTERED:none  DRAINS: none   LOCAL MEDICATIONS USED:  XYLOCAINE 1 % without epi ~ 10 cc.  SPECIMEN:  Source of Specimen:  porta cath from Left subclavian, (not sent to path.)  DISPOSITION OF SPECIMEN:  N/A  COUNTS:  YES  TOURNIQUET:  * No tourniquets in log *  DICTATION: .Other Dictation: Dictation Number OR dict. # [id number].  PLAN OF CARE: Discharge to home after PACU  PATIENT DISPOSITION:  PACU - hemodynamically stable.   Delay start of Pharmacological VTE agent (>24hrs) due to surgical blood loss or risk of bleeding: not applicable

## 2013-01-17 NOTE — Anesthesia Procedure Notes (Signed)
Procedure Name: MAC Date/Time: 01/17/2013 12:48 PM Performed by: Carolyne Littles, AMY L Pre-anesthesia Checklist: Patient identified, Timeout performed, Emergency Drugs available, Suction available and Patient being monitored Oxygen Delivery Method: Non-rebreather mask

## 2013-01-18 NOTE — Op Note (Signed)
NAME:  Charles [name], Charles [name] NO.:  [id number]  MEDICAL RECORD NO.:  [id number]  LOCATION:  [facility]                          FACILITY:  [facility]  PHYSICIAN:  Barbaraann Barthel, M.D. DATE OF BIRTH:  Feb 12, 1941  DATE OF PROCEDURE:  01/17/2013 DATE OF DISCHARGE:  01/17/2013                              OPERATIVE REPORT   DIAGNOSIS:  Non-Hodgkin's lymphoma.  PROCEDURE:  Removal of left subclavian Port-A-Cath.  SPECIMEN:  Port-A-Cath infusion device (specimen was not sent to Pathology).  WOUND CLASSIFICATION:  Clean.  NOTE:  This is a 72 year old white male, who was treated for non- Hodgkin's lymphoma by chemotherapy in 2012.  He had finished his use of Port-A-Cath and we planned to remove this via the outpatient department. Because this patient was on Coumadin, we discussed his Coumadin perioperative care with the Cardiology Department, who will coordinate this.  We received their communications and he will be placed on his usual dose the same day after his surgery and he is encouraged to follow up with Dr. Diona Browner for continued care for his Coumadin regarding his chronic atrial fibrillation.  GROSS OPERATIVE FINDINGS:  Pseudocapsule around the infusion device, otherwise normal expected findings.  TECHNIQUE:  The patient was placed in supine position and after sedation by the Anesthesia Department, local anesthesia was used around the [facility]- A-Cath infusion device.  The Port-A-Cath was then removed.  The pseudocapsule around the catheter was clamped and ligated with 3-0 Polysorb and bleeding was controlled with a cautery device.  This was minimal less than 10 mL and the incision was then closed with 4-0 Polysorb and a subcuticular suture used to close the subcuticular area with 5-0 Polysorb.  Steri-Strips, Neosporin and a sterile dressing was applied.  Prior to closure, all sponge, needle, and instrument counts were found to be correct. Estimated blood loss was minimal.   The patient received about 600 mL of crystalloids intraoperatively.  There were no drains, no complications. The patient was taken to the recovery room where he will be discharged and instructions will be given for him to follow up with his medical team regarding his Coumadin.     Barbaraann Barthel, M.D.     WB/MEDQ  D:  01/17/2013  T:  01/18/2013  Job:  [id number]  cc:   Jonelle Sidle, MD

## 2013-01-19 ENCOUNTER — Encounter (HOSPITAL_COMMUNITY): Payer: Self-pay | Admitting: General Surgery

## 2013-01-27 ENCOUNTER — Other Ambulatory Visit: Payer: Self-pay

## 2013-02-01 ENCOUNTER — Ambulatory Visit (INDEPENDENT_AMBULATORY_CARE_PROVIDER_SITE_OTHER): Payer: Medicare Other | Admitting: *Deleted

## 2013-02-01 DIAGNOSIS — Z7901 Long term (current) use of anticoagulants: Secondary | ICD-10-CM

## 2013-02-01 DIAGNOSIS — I4891 Unspecified atrial fibrillation: Secondary | ICD-10-CM

## 2013-02-01 LAB — POCT INR: INR: 3.8

## 2013-02-08 ENCOUNTER — Ambulatory Visit (INDEPENDENT_AMBULATORY_CARE_PROVIDER_SITE_OTHER): Payer: Medicare Other | Admitting: *Deleted

## 2013-02-08 DIAGNOSIS — Z7901 Long term (current) use of anticoagulants: Secondary | ICD-10-CM

## 2013-02-08 DIAGNOSIS — I4891 Unspecified atrial fibrillation: Secondary | ICD-10-CM

## 2013-02-08 LAB — POCT INR: INR: 2.5

## 2013-02-23 ENCOUNTER — Ambulatory Visit (INDEPENDENT_AMBULATORY_CARE_PROVIDER_SITE_OTHER): Payer: Medicare Other | Admitting: *Deleted

## 2013-02-23 DIAGNOSIS — Z7901 Long term (current) use of anticoagulants: Secondary | ICD-10-CM

## 2013-02-23 DIAGNOSIS — I4891 Unspecified atrial fibrillation: Secondary | ICD-10-CM

## 2013-02-23 LAB — POCT INR: INR: 2

## 2013-03-09 ENCOUNTER — Ambulatory Visit (INDEPENDENT_AMBULATORY_CARE_PROVIDER_SITE_OTHER): Payer: Medicare Other | Admitting: *Deleted

## 2013-03-09 DIAGNOSIS — I4891 Unspecified atrial fibrillation: Secondary | ICD-10-CM

## 2013-03-09 DIAGNOSIS — Z7901 Long term (current) use of anticoagulants: Secondary | ICD-10-CM

## 2013-03-09 LAB — POCT INR: INR: 2.8

## 2013-03-22 ENCOUNTER — Emergency Department (HOSPITAL_COMMUNITY)
Admission: EM | Admit: 2013-03-22 | Discharge: 2013-03-22 | Disposition: A | Discharge status: Home / Self Care | Payer: Medicare Other | Attending: Emergency Medicine | Admitting: Emergency Medicine

## 2013-03-22 ENCOUNTER — Encounter (HOSPITAL_COMMUNITY): Payer: Self-pay | Admitting: Emergency Medicine

## 2013-03-22 ENCOUNTER — Emergency Department (HOSPITAL_COMMUNITY): Payer: Medicare Other

## 2013-03-22 DIAGNOSIS — I4891 Unspecified atrial fibrillation: Secondary | ICD-10-CM | POA: Insufficient documentation

## 2013-03-22 DIAGNOSIS — Z7901 Long term (current) use of anticoagulants: Secondary | ICD-10-CM | POA: Insufficient documentation

## 2013-03-22 DIAGNOSIS — Z8739 Personal history of other diseases of the musculoskeletal system and connective tissue: Secondary | ICD-10-CM | POA: Insufficient documentation

## 2013-03-22 DIAGNOSIS — Z87891 Personal history of nicotine dependence: Secondary | ICD-10-CM | POA: Insufficient documentation

## 2013-03-22 DIAGNOSIS — R339 Retention of urine, unspecified: Secondary | ICD-10-CM | POA: Insufficient documentation

## 2013-03-22 DIAGNOSIS — I10 Essential (primary) hypertension: Secondary | ICD-10-CM | POA: Insufficient documentation

## 2013-03-22 DIAGNOSIS — J441 Chronic obstructive pulmonary disease with (acute) exacerbation: Secondary | ICD-10-CM

## 2013-03-22 DIAGNOSIS — F411 Generalized anxiety disorder: Secondary | ICD-10-CM | POA: Insufficient documentation

## 2013-03-22 DIAGNOSIS — Z8711 Personal history of peptic ulcer disease: Secondary | ICD-10-CM | POA: Insufficient documentation

## 2013-03-22 DIAGNOSIS — J209 Acute bronchitis, unspecified: Secondary | ICD-10-CM | POA: Insufficient documentation

## 2013-03-22 DIAGNOSIS — J40 Bronchitis, not specified as acute or chronic: Secondary | ICD-10-CM

## 2013-03-22 DIAGNOSIS — Z85828 Personal history of other malignant neoplasm of skin: Secondary | ICD-10-CM | POA: Insufficient documentation

## 2013-03-22 DIAGNOSIS — Z79899 Other long term (current) drug therapy: Secondary | ICD-10-CM | POA: Insufficient documentation

## 2013-03-22 DIAGNOSIS — Z8701 Personal history of pneumonia (recurrent): Secondary | ICD-10-CM | POA: Insufficient documentation

## 2013-03-22 DIAGNOSIS — Z87898 Personal history of other specified conditions: Secondary | ICD-10-CM | POA: Insufficient documentation

## 2013-03-22 MED ORDER — ALBUTEROL SULFATE (2.5 MG/3ML) 0.083% IN NEBU
5.0000 mg | INHALATION_SOLUTION | Freq: Once | RESPIRATORY_TRACT | Status: AC
  Administered 2013-03-22: 5 mg via RESPIRATORY_TRACT
  Filled 2013-03-22: qty 6

## 2013-03-22 MED ORDER — IPRATROPIUM BROMIDE 0.02 % IN SOLN
0.5000 mg | Freq: Once | RESPIRATORY_TRACT | Status: AC
  Administered 2013-03-22: 0.5 mg via RESPIRATORY_TRACT
  Filled 2013-03-22: qty 2.5

## 2013-03-22 MED ORDER — AZITHROMYCIN 250 MG PO TABS: ORAL_TABLET | ORAL | Status: DC

## 2013-03-22 MED ORDER — PREDNISONE 20 MG PO TABS: ORAL_TABLET | ORAL | Status: DC

## 2013-03-22 NOTE — Discharge Instructions (Signed)
Chest x-ray has improved.   Prescriptions for antibiotic and prednisone.   Continue to use your nebulizer at home

## 2013-03-22 NOTE — ED Provider Notes (Signed)
CSN: [id number]     Arrival date & time 03/22/13  1250 History  This chart was scribed for Donnetta Hutching, MD by Dorothey Baseman, ED Scribe. This patient was seen in room [facility] and the patient's care was started at 2:30 PM.    Chief Complaint  Patient presents with  . Shortness of Breath  . Nasal Congestion   The history is provided by the patient and the spouse. No language interpreter was used.   HPI Comments: Charles [name] is a 72 y.o. male with a history of COPD, pneumonia, allergic rhinitis who presents to the Emergency Department complaining of a constant, productive cough with dark-colored sputum with associated wheezes, congestion, and intermittent subjective fever (patient is afebrile at 97.9 in the ED) onset about 5 days ago. She reports giving the patient his Proair inhaler at home without significant relief. She reports some associated urinary retention after he used his inhaler. Patient states that he has been treated with prednisone in the past with relief. She reports that the patient has been exposed to sick contacts. Patient also has a history of HTN, peptic ulcer disease, basal cell carcinoma.  Past Medical History  Diagnosis Date  . Essential hypertension, benign   . Atrial fibrillation 10/2008  . 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 Peptic ulcer disease   . Degenerative joint disease     Knees and shoulders  . Pneumonia 2010  . Allergic rhinitis   . Anxiety   . Drug abuse   . Tobacco abuse     50 pack years  . Mitral regurgitation     Moderate to severe May 2014  . 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 Basal cell carcinoma   . COPD (chronic obstructive pulmonary disease)    Past Surgical History  Procedure Laterality Date  . Neck lesion biopsy  June 24, 2010    Follicular lymphoma  . Skin cancer excision      under left breast; variously described as melanoma and basal cell  . Rotator cuff repair       right  . Patella fracture surgery Left 1975  . Hematoma evacuation      after melanoma removal  . Portacath placement  07/12/2010  . Colonoscopy  2009    Also underwent an EGD; patient reports no significant findings  . Bone marrow aspiration  06/2012  . Bone marrow biopsy  06/2012  . Fungal infection    . Port-a-cath removal Left 01/17/2013    Procedure: REMOVAL PORT-A-CATH;  Surgeon: Marlane Hatcher, MD;  Location: [facility];  Service: General;  Laterality: Left;   Family History  Problem Relation Age of Onset  . Heart attack Father   . Coronary artery disease Other    History  Substance Use Topics  . Smoking status: Former Smoker -- 1.00 packs/day for 50 years    Types: Cigarettes    Quit date: 04/12/2012  . Smokeless tobacco: Never Used  . Alcohol Use: No    Review of Systems  A complete 10 system review of systems was obtained and all systems are negative except as noted in the HPI and PMH.   Allergies  Statins  Home Medications   Current Outpatient Rx  Name  Route  Sig  Dispense  Refill  . acetaminophen (TYLENOL) 325 MG tablet   Oral   Take 2 tablets (650 mg total) by mouth every 6 (six) hours as needed for fever.         Marland Kitchen  ALPRAZolam (XANAX) 1 MG tablet   Oral   Take 0.5-1 mg by mouth 3 (three) times daily.         . calcium citrate (CALCITRATE - DOSED IN MG ELEMENTAL CALCIUM) 950 MG tablet   Oral   Take 1 tablet by mouth 2 (two) times daily.         . digoxin (LANOXIN) 0.125 MG tablet   Oral   Take 1 tablet (0.125 mg total) by mouth daily.   30 tablet   11   . diltiazem (CARDIZEM CD) 240 MG 24 hr capsule   Oral   Take 1 capsule (240 mg total) by mouth daily.   90 capsule   3   . furosemide (LASIX) 20 MG tablet   Oral   Take 3 tablets (60 mg total) by mouth daily.   30 tablet   5   . guaiFENesin (MUCINEX) 600 MG 12 hr tablet   Oral   Take 600 mg by mouth 2 (two) times daily as needed for congestion.          Marland Kitchen  HYDROcodone-acetaminophen (NORCO/VICODIN) 5-325 MG per tablet   Oral   Take 1 tablet by mouth every 4 (four) hours as needed.   30 tablet   0   . lovastatin (MEVACOR) 10 MG tablet   Oral   Take 10 mg by mouth at bedtime.          . metoprolol tartrate (LOPRESSOR) 25 MG tablet   Oral   Take 1 tablet (25 mg total) by mouth 2 (two) times daily.   60 tablet   11   . nystatin (MYCOSTATIN) powder   Topical   Apply topically 4 (four) times daily.   15 g   0   . omeprazole (PRILOSEC) 20 MG capsule   Oral   Take 20 mg by mouth daily.         . potassium chloride (K-DUR) 10 MEQ tablet   Oral   Take 1 tablet (10 mEq total) by mouth 2 (two) times daily.   30 tablet   11   . PROAIR HFA 108 (90 BASE) MCG/ACT inhaler   Inhalation   Inhale 1 puff into the lungs every 6 (six) hours as needed. For shortness of breath         . sertraline (ZOLOFT) 50 MG tablet   Oral   Take 50 mg by mouth at bedtime.         Marland Kitchen tiotropium (SPIRIVA) 18 MCG inhalation capsule   Inhalation   Place 18 mcg into inhaler and inhale daily.         . traMADol (ULTRAM) 50 MG tablet   Oral   Take 50 mg by mouth every 6 (six) hours as needed. For pain         . warfarin (COUMADIN) 5 MG tablet   Oral   Take 5 mg by mouth every evening.         . warfarin (COUMADIN) 5 MG tablet      Take 1 tablet on Monday and Thursday. Take 1/2 tablet on Tues, Weds, Fri, Sat, Sun   30 tablet   3    Triage Vitals: BP 137/78  Temp(Src) 97.9 F (36.6 C)  Resp 20  Ht 5\' 7"  (1.702 m)  Wt 194 lb (87.998 kg)  BMI 30.38 kg/m2  SpO2 98%  Physical Exam  Nursing note and vitals reviewed. Constitutional: He is oriented to person, place, and time. He appears  well-developed and well-nourished. No distress.  HENT:  Head: Normocephalic and atraumatic.  Eyes: Conjunctivae are normal.  Neck: Normal range of motion. Neck supple.  Cardiovascular: Normal rate, regular rhythm and normal heart sounds.    Pulmonary/Chest: Effort normal. No respiratory distress. He has wheezes.  Bilateral wheezes and rhonchi.  Abdominal: He exhibits no distension.  Musculoskeletal: Normal range of motion.  Neurological: He is alert and oriented to person, place, and time.  Skin: Skin is warm and dry.  Psychiatric: He has a normal mood and affect. His behavior is normal.    ED Course  Procedures (including critical care time)  DIAGNOSTIC STUDIES: Oxygen Saturation is 98% on room air, normal by my interpretation.    COORDINATION OF CARE: 2:33 PM- Will order a breathing treatment and a chest x-ray. Discussed treatment plan with patient at bedside and patient verbalized agreement.     Labs Review Labs Reviewed - No data to display Imaging Review Dg Chest 2 View  03/22/2013   CLINICAL DATA:  Shortness of breath with cough and congestion.  EXAM: CHEST  2 VIEW  COMPARISON:  10/01/2012 and 10/26/2012.  FINDINGS: Left subclavian Port-A-Cath has been removed in the interval. The heart size and mediastinal contours are stable. Right pleural effusion and right basilar airspace disease have resolved. The lungs are hyperinflated but clear. Prior right total shoulder arthroplasty again noted.  IMPRESSION: Interval resolution of right pleural effusion and right basilar airspace disease. No acute findings identified. Emphysema noted.   Electronically Signed   By: Roxy Horseman M.D.   On: 03/22/2013 15:23    EKG Interpretation   None       MDM  No diagnosis found. Chest x-ray shows interval resolution of right pleural effusion and right basilar airspace disease.  Patient has COPD.  Rx Zithromax and prednisone. Patient has primary care followup. Findings discussed with patient and his wife. They're comfortable with this plan.   I personally performed the services described in this documentation, which was scribed in my presence. The recorded information has been reviewed and is accurate.     Donnetta Hutching,  MD 03/24/13 1102

## 2013-03-22 NOTE — ED Notes (Signed)
Pt alert & oriented x4, stable gait. Patient given discharge instructions, paperwork & prescription(s). Patient  instructed to stop at the registration desk to finish any additional paperwork. Patient verbalized understanding. Pt left department w/ no further questions. 

## 2013-03-22 NOTE — ED Notes (Addendum)
Pt. Reports shortness of breath and cough starting 12/25. Pt. Reports productive cough.Pt. Reports diarrhea starting 12/25. Pt. Denies nausea and vomiting. Pt. in no acute distress.

## 2013-03-30 ENCOUNTER — Ambulatory Visit (INDEPENDENT_AMBULATORY_CARE_PROVIDER_SITE_OTHER): Payer: Medicare Other | Admitting: *Deleted

## 2013-03-30 DIAGNOSIS — I4891 Unspecified atrial fibrillation: Secondary | ICD-10-CM

## 2013-03-30 DIAGNOSIS — Z7901 Long term (current) use of anticoagulants: Secondary | ICD-10-CM

## 2013-03-30 LAB — POCT INR: INR: 4.6

## 2013-04-13 ENCOUNTER — Encounter: Payer: Self-pay | Admitting: *Deleted

## 2013-04-14 ENCOUNTER — Ambulatory Visit (INDEPENDENT_AMBULATORY_CARE_PROVIDER_SITE_OTHER): Payer: Medicare Other | Admitting: *Deleted

## 2013-04-14 DIAGNOSIS — Z7901 Long term (current) use of anticoagulants: Secondary | ICD-10-CM

## 2013-04-14 DIAGNOSIS — I4891 Unspecified atrial fibrillation: Secondary | ICD-10-CM

## 2013-04-14 LAB — POCT INR: INR: 4.3

## 2013-04-18 ENCOUNTER — Other Ambulatory Visit: Payer: Self-pay | Admitting: Cardiology

## 2013-04-20 NOTE — Progress Notes (Signed)
[name], MD [address] [place] [address]  Follicular lymphoma - Plan: CBC with Differential, Comprehensive metabolic panel, Lactate dehydrogenase, Sedimentation rate, CBC with Differential, Comprehensive metabolic panel, Lactate dehydrogenase, Sedimentation rate, Beta 2 microglobuline, serum  CURRENT THERAPY:Surveillance per NCCN guidelines  INTERVAL HISTORY: Charles [name] 73 y.o. 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 He is S/P Bendamustine/Rituxan finishing 6 cycles of 11/05/2010 followed by 4 maintenance Rituxan infusion stopping after the 4th infusion on 12/09/2011 secondary to complications. Last PET scan in April 2014 did not show any evidence of lymphoma.  Charles [name] was in the ED on 03/22/2013 with sinus congestion and signs and symptoms of an URI.  He was treated with Prednisone and Zithromax with outpatient PCP follow-up.   Charles [name] had his port removed on 01/18/2013 by Dr. Romona Curls due to nonfunctional device.   I personally reviewed and went over laboratory results with the patient.  Results follow below.  I personally reviewed and went over radiographic studies with the patient.  Results follow below.   Chest Xray at the end of Dec 2014 was negative for any acute findings.  He admits to feeling better, but continues with a clear to brown sputum production.  He is noted to have multiple infections over the past year, but his immunoglobulin levels have been adequate.  The NCCN guidelines for surveillance of Follicular Lymphoma are:  A. H+P every 3-6 months x 5 years and then annually or as indicated  B. Labs every 3-6 months x 5 years and then annually or as indicated  C. Surveillance imaging no more than every 6 months x 2 years post-treatment and then no more than annually or as clinically indicated.  He admits to  anxiety-induced sweating.  He notes that when he takes a Xanax and relaxes, the symptom resolves.  He denies any B symptoms and his weight is very stable.   He left knee has been problematic for him.  He has seen Dr. Aline Brochure and Dr. Luna Glasgow who both report that he is not a surgical candidate.  We discussed the opportunity for a 2nd/3rd opinion with a tertiary clinic or [place] or [facility].  He sounds agreeable to this and he will discuss this with his PCP.    His wife notes a lump in the right side of his neck she would like me to exam.  It is noted that he has 2, subcentimeter hard lymph nodes in the anterior cervical chain and one subcentimeter lymph node in the medial aspect of the supraclavicular fossa.  Indications for treatment of Stage II bulky, III, IV Follicular Lymphoma per NCCN guidelines are as follows:  A. Candidate for clinical trial  B. Symptoms  C. Threatened end-organ functions  D. Cytopenia secondary to lymphoma  E. Bulky disease  F. Steady progression If no indication for treatment is identified, observation is recommended for progressive diease and transformation to diffuse large B-cell lymphoma.  From a hematologic standpoint, he denies any complaints and ROS questioning is negative.  He denies any B symptoms as well.   Past Medical History  Diagnosis Date  . Essential hypertension, benign   . Atrial fibrillation 10/2008  . 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 Peptic ulcer disease   . Degenerative joint disease  Knees and shoulders  . Pneumonia 2010  . Allergic rhinitis   . Anxiety   . Drug abuse   . Tobacco abuse     50 pack years  . Mitral regurgitation     Moderate to severe May 2014  . Follicular lymphoma 5/0/9326  . Basal cell carcinoma   . COPD (chronic obstructive pulmonary disease)     has TOBACCO ABUSE; INSOMNIA, CHRONIC; Essential hypertension, benign; GASTRIC  ULCER, HX OF; Atrial fibrillation; Chronic anticoagulation; Follicular lymphoma; Hyperlipidemia; Calcium pyrophosphate crystal disease; Back pain; Chronic diastolic heart failure; Nonischemic cardiomyopathy; Mitral regurgitation; and OA (osteoarthritis) of knee on his problem list.     is allergic to statins.  Charles [name] had no medications administered during this visit.  Past Surgical History  Procedure Laterality Date  . Neck lesion biopsy  June 24, 7122    Follicular lymphoma  . Skin cancer excision      under left breast; variously described as melanoma and basal cell  . Rotator cuff repair      right  . Patella fracture surgery Left 1975  . Hematoma evacuation      after melanoma removal  . Portacath placement  07/12/2010  . Colonoscopy  2009    Also underwent an EGD; patient reports no significant findings  . Bone marrow aspiration  06/2012  . Bone marrow biopsy  06/2012  . Fungal infection    . Port-a-cath removal Left 01/17/2013    Procedure: REMOVAL PORT-A-CATH;  Surgeon: Scherry Ran, MD;  Location: [facility];  Service: General;  Laterality: Left;    Denies any headaches, dizziness, double vision, fevers, chills, night sweats, nausea, vomiting, diarrhea, constipation, chest pain, heart palpitations, shortness of breath, blood in stool, black tarry stool, urinary pain, urinary burning, urinary frequency, hematuria.   PHYSICAL EXAMINATION  ECOG PERFORMANCE STATUS: 3 - Symptomatic, >50% confined to bed  Filed Vitals:   04/21/13 1059  BP: 133/76  Pulse: 55  Temp: 97.6 F (36.4 C)  Resp: 18    GENERAL:alert, no distress, comfortable, cooperative, smiling and chronically-ill appearing SKIN: skin color, texture, turgor are normal, no rashes or significant lesions HEAD: Normocephalic, No masses, lesions, tenderness or abnormalities EYES: normal, PERRLA, EOMI, Conjunctiva are pink and non-injected EARS: External ears normal OROPHARYNX:lips, buccal mucosa, and tongue  normal and mucous membranes are moist  NECK: supple, thyroid normal size, non-tender, without nodularity, no stridor, non-tender, trachea midline, two subcentimeter hard lymph nodes palpated in the mid-anterior cervical lymph node chain adjacent to each other and a third subcentimeter lymph node in the medial aspect of the supraclavicular fossa. LYMPH:  As mentioned above BREAST:not examined LUNGS: clear to auscultation and percussion HEART: regular rate & rhythm, no murmurs, no gallops, S1 normal and S2 normal ABDOMEN:abdomen soft, non-tender and normal bowel sounds BACK: Back symmetric, no curvature. EXTREMITIES:less then 2 second capillary refill, no joint deformities, effusion, or inflammation, no skin discoloration, no cyanosis  NEURO: alert & oriented x 3 with fluent speech, no focal motor/sensory deficits, gait is slow and requires a rolling walker     LABORATORY DATA: CBC    Component Value Date/Time   WBC 6.1 04/21/2013 1032   RBC 3.89* 04/21/2013 1032   HGB 11.6* 04/21/2013 1032   HCT 35.2* 04/21/2013 1032   PLT 239 04/21/2013 1032   MCV 90.5 04/21/2013 1032   MCH 29.8 04/21/2013 1032   MCHC 33.0 04/21/2013 1032   RDW 15.9* 04/21/2013 1032   LYMPHSABS 1.1 04/21/2013 1032  MONOABS 0.7 04/21/2013 1032   EOSABS 0.6 04/21/2013 1032   BASOSABS 0.1 04/21/2013 1032      Chemistry      Component Value Date/Time   NA 141 04/21/2013 1032   K 4.4 04/21/2013 1032   CL 101 04/21/2013 1032   CO2 28 04/21/2013 1032   BUN 17 04/21/2013 1032   CREATININE 1.22 04/21/2013 1032   CREATININE 1.68* 10/30/2012 1112      Component Value Date/Time   CALCIUM 9.2 04/21/2013 1032   ALKPHOS 101 04/21/2013 1032   AST 17 04/21/2013 1032   ALT 7 04/21/2013 1032   BILITOT 0.4 04/21/2013 1032     Results for Charles [name], Charles [name] (MRN [id number]) as of 04/21/2013 11:41  Ref. Range 04/21/2013 10:32  LDH Latest Range: 94-250 U/L 250   Results for Charles, [name] (MRN [id number]) as of 04/21/2013 11:41  Ref. Range  04/21/2013 10:32  Sed Rate Latest Range: 0-16 mm/hr 30 (H)     RADIOGRAPHIC STUDIES:  03/22/2013  CLINICAL DATA: Shortness of breath with cough and congestion.  EXAM:  CHEST 2 VIEW  COMPARISON: 10/01/2012 and 10/26/2012.  FINDINGS:  Left subclavian Port-A-Cath has been removed in the interval. The  heart size and mediastinal contours are stable. Right pleural  effusion and right basilar airspace disease have resolved. The lungs  are hyperinflated but clear. Prior right total shoulder arthroplasty  again noted.  IMPRESSION:  Interval resolution of right pleural effusion and right basilar  airspace disease. No acute findings identified. Emphysema noted.  Electronically Signed  By: Camie Patience M.D.  On: 03/22/2013 15:23    ASSESSMENT:  1. 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 He is S/P Bendamustine/Rituxan finishing 6 cycles of 11/05/2010 followed by 4 maintenance Rituxan infusion stopping after the 4th infusion on 12/09/2011 secondary to complications. Last PET scan in April 2014 did not show any evidence of lymphoma.  Now with 3 subcentimeter lymph nodes noted on right side of neck and right supraclavicular fossa. 2. Left knee orthopedic issues, recommend second opinion 3. Difficulty with urination at times  Patient Active Problem List   Diagnosis Date Noted  . OA (osteoarthritis) of knee 12/30/2012  . Mitral regurgitation 10/27/2012  . Chronic diastolic heart failure 42/35/3614  . Calcium pyrophosphate crystal disease 09/07/2012  . Back pain 09/07/2012  . Hyperlipidemia 04/17/2011  . Follicular lymphoma 43/15/4008  . Atrial fibrillation 06/21/2010  . Chronic anticoagulation 06/21/2010  . Nonischemic cardiomyopathy 02/21/2009  . TOBACCO ABUSE 11/22/2008  . INSOMNIA, CHRONIC 11/22/2008  . GASTRIC ULCER, HX OF 11/22/2008  . Essential hypertension, benign 11/08/2008     PLAN:  1. I personally  reviewed and went over laboratory results with the patient.  2. Labs today: CBC diff, CMET, LDH, ESR, B2M  3. Labs in 4 months: CBC diff, CMET, LDH, ESR, B2M  4. NCCN guidelines reviewed.  5. Surveillance per NCCN guidelines  6. We can perform scans annually or as clinically indicated per NCCN guidelines.  7. I personally reviewed and went over radiographic studies with the patient.  8. Recommend follow-up with PCP as directed on 2/16 and recommend prostate examination for difficulty with urination 9. Recommend second opinion regarding left knee issues with larger orthopedic group such as [employer] or [employer] [facility] 10. Reviewed indication for treatment for Follicular Lymphoma. 11. Patient advised to be vigilant for B symptoms and lymph node enlargement. 12. Recommended continuation of Lasix as directed by Dr. Karie Kirks 13.  Return in 4 months for follow-up.    THERAPY PLAN:  We will continue surveillance for Follicular Lymphoma per NCCN guidelines. The NCCN guidelines for surveillance of Follicular Lymphoma are:  A. H+P every 3-6 months x 5 years and then annually or as indicated  B. Labs every 3-6 months x 5 years and then annually or as indicated  C. Surveillance imaging no more than every 6 months x 2 years post-treatment and then no more than annually or as clinically indicated.  Now that he has new right sided neck lymph nodes, we must be observant for indications of treatment.  NCCN guidelines for treatment for follicular lymphoma Stage II bulky, III, IV:  A. Candidate for clinical trial  B. Symptoms  C. Threatened end-organ function  D. Cytopenia secondary to disease  E. Bulky disease  F. Steady progression. If no indication for treatment is identified, observation is recommended for progressive diease and transformation to diffuse large B-cell lymphoma.  All questions were answered. The patient knows to call the clinic with any problems, questions or concerns. We can  certainly see the patient much sooner if necessary.  Patient and plan discussed with Dr. Farrel Gobble and he is in agreement with the aforementioned.   Zylon Creamer

## 2013-04-21 ENCOUNTER — Encounter (HOSPITAL_BASED_OUTPATIENT_CLINIC_OR_DEPARTMENT_OTHER): Payer: Medicare Other

## 2013-04-21 ENCOUNTER — Encounter (HOSPITAL_COMMUNITY): Payer: Medicare Other | Attending: Oncology | Admitting: Oncology

## 2013-04-21 VITALS — BP 133/76 | HR 55 | Temp 97.6°F | Resp 18 | Wt 198.7 lb

## 2013-04-21 DIAGNOSIS — R599 Enlarged lymph nodes, unspecified: Secondary | ICD-10-CM

## 2013-04-21 DIAGNOSIS — C8290 Follicular lymphoma, unspecified, unspecified site: Secondary | ICD-10-CM

## 2013-04-21 DIAGNOSIS — C8299 Follicular lymphoma, unspecified, extranodal and solid organ sites: Secondary | ICD-10-CM | POA: Insufficient documentation

## 2013-04-21 DIAGNOSIS — Z87898 Personal history of other specified conditions: Secondary | ICD-10-CM

## 2013-04-21 LAB — CBC WITH DIFFERENTIAL/PLATELET
BASOS ABS: 0.1 10*3/uL (ref 0.0–0.1)
Basophils Relative: 2 % — ABNORMAL HIGH (ref 0–1)
Eosinophils Absolute: 0.6 10*3/uL (ref 0.0–0.7)
Eosinophils Relative: 9 % — ABNORMAL HIGH (ref 0–5)
HCT: 35.2 % — ABNORMAL LOW (ref 39.0–52.0)
Hemoglobin: 11.6 g/dL — ABNORMAL LOW (ref 13.0–17.0)
LYMPHS PCT: 17 % (ref 12–46)
Lymphs Abs: 1.1 10*3/uL (ref 0.7–4.0)
MCH: 29.8 pg (ref 26.0–34.0)
MCHC: 33 g/dL (ref 30.0–36.0)
MCV: 90.5 fL (ref 78.0–100.0)
MONO ABS: 0.7 10*3/uL (ref 0.1–1.0)
MONOS PCT: 11 % (ref 3–12)
Neutro Abs: 3.7 10*3/uL (ref 1.7–7.7)
Neutrophils Relative %: 61 % (ref 43–77)
Platelets: 239 10*3/uL (ref 150–400)
RBC: 3.89 MIL/uL — ABNORMAL LOW (ref 4.22–5.81)
RDW: 15.9 % — AB (ref 11.5–15.5)
WBC: 6.1 10*3/uL (ref 4.0–10.5)

## 2013-04-21 LAB — COMPREHENSIVE METABOLIC PANEL
ALT: 7 U/L (ref 0–53)
AST: 17 U/L (ref 0–37)
Albumin: 3.7 g/dL (ref 3.5–5.2)
Alkaline Phosphatase: 101 U/L (ref 39–117)
BUN: 17 mg/dL (ref 6–23)
CALCIUM: 9.2 mg/dL (ref 8.4–10.5)
CO2: 28 meq/L (ref 19–32)
CREATININE: 1.22 mg/dL (ref 0.50–1.35)
Chloride: 101 mEq/L (ref 96–112)
GFR, EST AFRICAN AMERICAN: 66 mL/min — AB (ref >90)
GFR, EST NON AFRICAN AMERICAN: 57 mL/min — AB (ref >90)
Glucose, Bld: 92 mg/dL (ref 70–99)
Potassium: 4.4 mEq/L (ref 3.7–5.3)
Sodium: 141 mEq/L (ref 137–147)
Total Bilirubin: 0.4 mg/dL (ref 0.3–1.2)
Total Protein: 7.6 g/dL (ref 6.0–8.3)

## 2013-04-21 LAB — LACTATE DEHYDROGENASE: LDH: 250 U/L (ref 94–250)

## 2013-04-21 LAB — SEDIMENTATION RATE: SED RATE: 30 mm/h — AB (ref 0–16)

## 2013-04-21 NOTE — Progress Notes (Signed)
Labs drawn today for cbc/diff,cmp,ldh,sed rate 

## 2013-04-21 NOTE — Patient Instructions (Addendum)
[facility] Discharge Instructions  RECOMMENDATIONS MADE BY THE CONSULTANT AND ANY TEST RESULTS WILL BE SENT TO YOUR REFERRING PHYSICIAN.  EXAM FINDINGS BY THE PHYSICIAN TODAY AND SIGNS OR SYMPTOMS TO REPORT TO CLINIC OR PRIMARY PHYSICIAN: Exam and findings as discussed by T. Sheldon Silvan PA. We will see you back in 26months with labs   Thank you for choosing [facility] to provide your oncology and hematology care.  To afford each patient quality time with our providers, please arrive at least 15 minutes before your scheduled appointment time.  With your help, our goal is to use those 15 minutes to complete the necessary work-up to ensure our physicians have the information they need to help with your evaluation and healthcare recommendations.    Effective January 1st, 2014, we ask that you re-schedule your appointment with our physicians should you arrive 10 or more minutes late for your appointment.  We strive to give you quality time with our providers, and arriving late affects you and other patients whose appointments are after yours.    Again, thank you for choosing [facility].  Our hope is that these requests will decrease the amount of time that you wait before being seen by our physicians.       _____________________________________________________________  Should you have questions after your visit to [facility], please contact our office at [phone] between the hours of 8:30 a.m. and 5:00 p.m.  Voicemails left after 4:30 p.m. will not be returned until the following business day.  For prescription refill requests, have your pharmacy contact our office with your prescription refill request.

## 2013-04-25 ENCOUNTER — Ambulatory Visit (INDEPENDENT_AMBULATORY_CARE_PROVIDER_SITE_OTHER): Payer: Medicare Other | Admitting: *Deleted

## 2013-04-25 DIAGNOSIS — Z5181 Encounter for therapeutic drug level monitoring: Secondary | ICD-10-CM

## 2013-04-25 DIAGNOSIS — Z7901 Long term (current) use of anticoagulants: Secondary | ICD-10-CM

## 2013-04-25 DIAGNOSIS — I4891 Unspecified atrial fibrillation: Secondary | ICD-10-CM

## 2013-04-25 LAB — POCT INR: INR: 4.3

## 2013-04-29 ENCOUNTER — Other Ambulatory Visit (HOSPITAL_COMMUNITY): Payer: Self-pay | Admitting: Oncology

## 2013-05-05 ENCOUNTER — Ambulatory Visit (INDEPENDENT_AMBULATORY_CARE_PROVIDER_SITE_OTHER): Payer: Medicare Other | Admitting: *Deleted

## 2013-05-05 DIAGNOSIS — Z7901 Long term (current) use of anticoagulants: Secondary | ICD-10-CM

## 2013-05-05 DIAGNOSIS — I4891 Unspecified atrial fibrillation: Secondary | ICD-10-CM

## 2013-05-05 DIAGNOSIS — Z5181 Encounter for therapeutic drug level monitoring: Secondary | ICD-10-CM

## 2013-05-05 LAB — POCT INR: INR: 2.2

## 2013-05-23 ENCOUNTER — Emergency Department (HOSPITAL_COMMUNITY): Payer: Medicare Other

## 2013-05-23 ENCOUNTER — Inpatient Hospital Stay (HOSPITAL_COMMUNITY)
Admission: EM | Admit: 2013-05-23 | Discharge: 2013-05-25 | DRG: 293 | Disposition: A | Discharge status: Home / Self Care | Payer: Medicare Other | Attending: Internal Medicine | Admitting: Internal Medicine

## 2013-05-23 ENCOUNTER — Encounter (HOSPITAL_COMMUNITY): Payer: Self-pay | Admitting: Emergency Medicine

## 2013-05-23 DIAGNOSIS — E785 Hyperlipidemia, unspecified: Secondary | ICD-10-CM

## 2013-05-23 DIAGNOSIS — M1710 Unilateral primary osteoarthritis, unspecified knee: Secondary | ICD-10-CM

## 2013-05-23 DIAGNOSIS — G4700 Insomnia, unspecified: Secondary | ICD-10-CM

## 2013-05-23 DIAGNOSIS — G8929 Other chronic pain: Secondary | ICD-10-CM | POA: Diagnosis present

## 2013-05-23 DIAGNOSIS — Z5181 Encounter for therapeutic drug level monitoring: Secondary | ICD-10-CM

## 2013-05-23 DIAGNOSIS — I2510 Atherosclerotic heart disease of native coronary artery without angina pectoris: Secondary | ICD-10-CM | POA: Diagnosis present

## 2013-05-23 DIAGNOSIS — Z79899 Other long term (current) drug therapy: Secondary | ICD-10-CM

## 2013-05-23 DIAGNOSIS — K5900 Constipation, unspecified: Secondary | ICD-10-CM | POA: Diagnosis present

## 2013-05-23 DIAGNOSIS — J449 Chronic obstructive pulmonary disease, unspecified: Secondary | ICD-10-CM | POA: Diagnosis present

## 2013-05-23 DIAGNOSIS — Z87898 Personal history of other specified conditions: Secondary | ICD-10-CM

## 2013-05-23 DIAGNOSIS — I5042 Chronic combined systolic (congestive) and diastolic (congestive) heart failure: Secondary | ICD-10-CM | POA: Diagnosis present

## 2013-05-23 DIAGNOSIS — I10 Essential (primary) hypertension: Secondary | ICD-10-CM | POA: Diagnosis present

## 2013-05-23 DIAGNOSIS — Z7901 Long term (current) use of anticoagulants: Secondary | ICD-10-CM

## 2013-05-23 DIAGNOSIS — M1120 Other chondrocalcinosis, unspecified site: Secondary | ICD-10-CM

## 2013-05-23 DIAGNOSIS — Z85820 Personal history of malignant melanoma of skin: Secondary | ICD-10-CM

## 2013-05-23 DIAGNOSIS — I2589 Other forms of chronic ischemic heart disease: Secondary | ICD-10-CM | POA: Diagnosis present

## 2013-05-23 DIAGNOSIS — I5032 Chronic diastolic (congestive) heart failure: Secondary | ICD-10-CM

## 2013-05-23 DIAGNOSIS — Z87891 Personal history of nicotine dependence: Secondary | ICD-10-CM

## 2013-05-23 DIAGNOSIS — Z85828 Personal history of other malignant neoplasm of skin: Secondary | ICD-10-CM

## 2013-05-23 DIAGNOSIS — R0902 Hypoxemia: Secondary | ICD-10-CM

## 2013-05-23 DIAGNOSIS — F17200 Nicotine dependence, unspecified, uncomplicated: Secondary | ICD-10-CM

## 2013-05-23 DIAGNOSIS — Z8711 Personal history of peptic ulcer disease: Secondary | ICD-10-CM

## 2013-05-23 DIAGNOSIS — I5021 Acute systolic (congestive) heart failure: Principal | ICD-10-CM | POA: Diagnosis present

## 2013-05-23 DIAGNOSIS — Z8249 Family history of ischemic heart disease and other diseases of the circulatory system: Secondary | ICD-10-CM

## 2013-05-23 DIAGNOSIS — C8290 Follicular lymphoma, unspecified, unspecified site: Secondary | ICD-10-CM

## 2013-05-23 DIAGNOSIS — I428 Other cardiomyopathies: Secondary | ICD-10-CM | POA: Diagnosis present

## 2013-05-23 DIAGNOSIS — M179 Osteoarthritis of knee, unspecified: Secondary | ICD-10-CM

## 2013-05-23 DIAGNOSIS — I340 Nonrheumatic mitral (valve) insufficiency: Secondary | ICD-10-CM

## 2013-05-23 DIAGNOSIS — I4891 Unspecified atrial fibrillation: Secondary | ICD-10-CM | POA: Diagnosis present

## 2013-05-23 DIAGNOSIS — I509 Heart failure, unspecified: Secondary | ICD-10-CM | POA: Diagnosis present

## 2013-05-23 DIAGNOSIS — M549 Dorsalgia, unspecified: Secondary | ICD-10-CM | POA: Diagnosis present

## 2013-05-23 DIAGNOSIS — F411 Generalized anxiety disorder: Secondary | ICD-10-CM | POA: Diagnosis present

## 2013-05-23 DIAGNOSIS — J4489 Other specified chronic obstructive pulmonary disease: Secondary | ICD-10-CM | POA: Diagnosis present

## 2013-05-23 DIAGNOSIS — J441 Chronic obstructive pulmonary disease with (acute) exacerbation: Secondary | ICD-10-CM

## 2013-05-23 LAB — CBC WITH DIFFERENTIAL/PLATELET
BASOS PCT: 1 % (ref 0–1)
Basophils Absolute: 0.1 10*3/uL (ref 0.0–0.1)
EOS ABS: 0.2 10*3/uL (ref 0.0–0.7)
Eosinophils Relative: 4 % (ref 0–5)
HCT: 33.4 % — ABNORMAL LOW (ref 39.0–52.0)
Hemoglobin: 11 g/dL — ABNORMAL LOW (ref 13.0–17.0)
Lymphocytes Relative: 11 % — ABNORMAL LOW (ref 12–46)
Lymphs Abs: 0.6 10*3/uL — ABNORMAL LOW (ref 0.7–4.0)
MCH: 30.1 pg (ref 26.0–34.0)
MCHC: 32.9 g/dL (ref 30.0–36.0)
MCV: 91.3 fL (ref 78.0–100.0)
Monocytes Absolute: 0.4 10*3/uL (ref 0.1–1.0)
Monocytes Relative: 7 % (ref 3–12)
NEUTROS PCT: 77 % (ref 43–77)
Neutro Abs: 4.1 10*3/uL (ref 1.7–7.7)
PLATELETS: 193 10*3/uL (ref 150–400)
RBC: 3.66 MIL/uL — ABNORMAL LOW (ref 4.22–5.81)
RDW: 16.4 % — ABNORMAL HIGH (ref 11.5–15.5)
WBC: 5.3 10*3/uL (ref 4.0–10.5)

## 2013-05-23 LAB — BASIC METABOLIC PANEL
BUN: 18 mg/dL (ref 6–23)
CO2: 25 mEq/L (ref 19–32)
Calcium: 9.7 mg/dL (ref 8.4–10.5)
Chloride: 100 mEq/L (ref 96–112)
Creatinine, Ser: 1.17 mg/dL (ref 0.50–1.35)
GFR calc Af Amer: 70 mL/min — ABNORMAL LOW (ref >90)
GFR, EST NON AFRICAN AMERICAN: 60 mL/min — AB (ref >90)
Glucose, Bld: 118 mg/dL — ABNORMAL HIGH (ref 70–99)
POTASSIUM: 4.3 meq/L (ref 3.7–5.3)
SODIUM: 137 meq/L (ref 137–147)

## 2013-05-23 LAB — PRO B NATRIURETIC PEPTIDE: PRO B NATRI PEPTIDE: 8068 pg/mL — AB (ref 0–125)

## 2013-05-23 LAB — PROTIME-INR
INR: 2.87 — ABNORMAL HIGH (ref 0.00–1.49)
PROTHROMBIN TIME: 29.1 s — AB (ref 11.6–15.2)

## 2013-05-23 LAB — TROPONIN I: Troponin I: <0.3 ng/mL (ref <0.30)

## 2013-05-23 MED ORDER — ONDANSETRON HCL 4 MG/2ML IJ SOLN: 4.0000 mg | Freq: Four times a day (QID) | INTRAMUSCULAR | Status: DC | PRN

## 2013-05-23 MED ORDER — FLEET ENEMA 7-19 GM/118ML RE ENEM: 1.0000 | ENEMA | Freq: Every day | RECTAL | Status: DC | PRN

## 2013-05-23 MED ORDER — CALCIUM CARBONATE 1250 (500 CA) MG PO TABS
1.0000 | ORAL_TABLET | Freq: Two times a day (BID) | ORAL | Status: DC
  Administered 2013-05-23 – 2013-05-25 (×4): 500 mg via ORAL
  Filled 2013-05-23 (×4): qty 1

## 2013-05-23 MED ORDER — POTASSIUM CHLORIDE CRYS ER 10 MEQ PO TBCR
10.0000 meq | EXTENDED_RELEASE_TABLET | Freq: Every day | ORAL | Status: DC
  Administered 2013-05-24 – 2013-05-25 (×2): 10 meq via ORAL
  Filled 2013-05-23 (×4): qty 1

## 2013-05-23 MED ORDER — TIOTROPIUM BROMIDE MONOHYDRATE 18 MCG IN CAPS
18.0000 ug | ORAL_CAPSULE | RESPIRATORY_TRACT | Status: DC
  Administered 2013-05-25: 18 ug via RESPIRATORY_TRACT
  Filled 2013-05-23: qty 5

## 2013-05-23 MED ORDER — FUROSEMIDE 10 MG/ML IJ SOLN
40.0000 mg | Freq: Once | INTRAMUSCULAR | Status: AC
  Administered 2013-05-23: 40 mg via INTRAVENOUS
  Filled 2013-05-23: qty 4

## 2013-05-23 MED ORDER — VITAMIN D 1000 UNITS PO TABS
1000.0000 [IU] | ORAL_TABLET | Freq: Every morning | ORAL | Status: DC
  Administered 2013-05-24 – 2013-05-25 (×2): 1000 [IU] via ORAL
  Filled 2013-05-23 (×2): qty 1

## 2013-05-23 MED ORDER — IPRATROPIUM-ALBUTEROL 0.5-2.5 (3) MG/3ML IN SOLN
3.0000 mL | Freq: Once | RESPIRATORY_TRACT | Status: AC
  Administered 2013-05-23: 3 mL via RESPIRATORY_TRACT
  Filled 2013-05-23: qty 3

## 2013-05-23 MED ORDER — DOCUSATE SODIUM 100 MG PO CAPS
100.0000 mg | ORAL_CAPSULE | Freq: Two times a day (BID) | ORAL | Status: DC
  Administered 2013-05-23 – 2013-05-25 (×4): 100 mg via ORAL
  Filled 2013-05-23 (×4): qty 1

## 2013-05-23 MED ORDER — WARFARIN - PHARMACIST DOSING INPATIENT: Status: DC

## 2013-05-23 MED ORDER — POLYETHYLENE GLYCOL 3350 17 G PO PACK
17.0000 g | PACK | Freq: Every day | ORAL | Status: DC
  Administered 2013-05-23 – 2013-05-25 (×3): 17 g via ORAL
  Filled 2013-05-23 (×3): qty 1

## 2013-05-23 MED ORDER — DILTIAZEM HCL ER COATED BEADS 240 MG PO CP24
240.0000 mg | ORAL_CAPSULE | Freq: Every morning | ORAL | Status: DC
  Administered 2013-05-24 – 2013-05-25 (×2): 240 mg via ORAL
  Filled 2013-05-23 (×4): qty 1

## 2013-05-23 MED ORDER — SERTRALINE HCL 50 MG PO TABS
50.0000 mg | ORAL_TABLET | Freq: Every day | ORAL | Status: DC
  Administered 2013-05-23 – 2013-05-24 (×2): 50 mg via ORAL
  Filled 2013-05-23 (×2): qty 1

## 2013-05-23 MED ORDER — ONDANSETRON HCL 4 MG PO TABS: 4.0000 mg | ORAL_TABLET | Freq: Four times a day (QID) | ORAL | Status: DC | PRN

## 2013-05-23 MED ORDER — FUROSEMIDE 10 MG/ML IJ SOLN
40.0000 mg | Freq: Two times a day (BID) | INTRAMUSCULAR | Status: DC
  Administered 2013-05-23 – 2013-05-24 (×3): 40 mg via INTRAVENOUS
  Filled 2013-05-23 (×3): qty 4

## 2013-05-23 MED ORDER — SODIUM CHLORIDE 0.9 % IJ SOLN
3.0000 mL | Freq: Two times a day (BID) | INTRAMUSCULAR | Status: DC
  Administered 2013-05-23 – 2013-05-24 (×3): 3 mL via INTRAVENOUS

## 2013-05-23 MED ORDER — PANTOPRAZOLE SODIUM 40 MG PO TBEC
40.0000 mg | DELAYED_RELEASE_TABLET | Freq: Every day | ORAL | Status: DC
  Administered 2013-05-24 – 2013-05-25 (×2): 40 mg via ORAL
  Filled 2013-05-23 (×2): qty 1

## 2013-05-23 MED ORDER — ACETAMINOPHEN 325 MG PO TABS: 650.0000 mg | ORAL_TABLET | Freq: Four times a day (QID) | ORAL | Status: DC | PRN

## 2013-05-23 MED ORDER — CALCIUM CITRATE 950 (200 CA) MG PO TABS: 1.0000 | ORAL_TABLET | Freq: Two times a day (BID) | ORAL | Status: DC

## 2013-05-23 MED ORDER — BIOTENE DRY MOUTH MT LIQD
15.0000 mL | Freq: Two times a day (BID) | OROMUCOSAL | Status: DC
  Administered 2013-05-23 – 2013-05-25 (×4): 15 mL via OROMUCOSAL

## 2013-05-23 MED ORDER — METHYLPREDNISOLONE SODIUM SUCC 125 MG IJ SOLR
125.0000 mg | Freq: Once | INTRAMUSCULAR | Status: AC
  Administered 2013-05-23: 125 mg via INTRAVENOUS
  Filled 2013-05-23: qty 2

## 2013-05-23 MED ORDER — ALBUTEROL SULFATE (2.5 MG/3ML) 0.083% IN NEBU: 2.5000 mg | INHALATION_SOLUTION | RESPIRATORY_TRACT | Status: DC | PRN

## 2013-05-23 MED ORDER — ONDANSETRON HCL 4 MG/2ML IJ SOLN: 4.0000 mg | Freq: Three times a day (TID) | INTRAMUSCULAR | Status: DC | PRN

## 2013-05-23 MED ORDER — ALPRAZOLAM 0.5 MG PO TABS
0.5000 mg | ORAL_TABLET | Freq: Three times a day (TID) | ORAL | Status: DC
  Administered 2013-05-23 – 2013-05-24 (×3): 0.5 mg via ORAL
  Filled 2013-05-23 (×3): qty 1

## 2013-05-23 MED ORDER — ACETAMINOPHEN 650 MG RE SUPP: 650.0000 mg | Freq: Four times a day (QID) | RECTAL | Status: DC | PRN

## 2013-05-23 MED ORDER — DIGOXIN 125 MCG PO TABS
0.1250 mg | ORAL_TABLET | Freq: Every morning | ORAL | Status: DC
  Administered 2013-05-24 – 2013-05-25 (×2): 0.125 mg via ORAL
  Filled 2013-05-23 (×2): qty 1

## 2013-05-23 MED ORDER — SENNA 8.6 MG PO TABS
1.0000 | ORAL_TABLET | Freq: Every day | ORAL | Status: DC
  Administered 2013-05-23 – 2013-05-25 (×3): 8.6 mg via ORAL
  Filled 2013-05-23 (×3): qty 1

## 2013-05-23 MED ORDER — MOMETASONE FURO-FORMOTEROL FUM 100-5 MCG/ACT IN AERO
2.0000 | INHALATION_SPRAY | Freq: Two times a day (BID) | RESPIRATORY_TRACT | Status: DC
  Administered 2013-05-23 – 2013-05-25 (×4): 2 via RESPIRATORY_TRACT
  Filled 2013-05-23: qty 8.8

## 2013-05-23 MED ORDER — HYDROCODONE-ACETAMINOPHEN 10-325 MG PO TABS
1.0000 | ORAL_TABLET | Freq: Four times a day (QID) | ORAL | Status: DC | PRN
  Administered 2013-05-23 – 2013-05-25 (×5): 1 via ORAL
  Filled 2013-05-23 (×5): qty 1

## 2013-05-23 NOTE — H&P (Signed)
[facility] History and Physical  Charles [name] SEG:[id number] DOB: 21-Nov-1940 DOA: 05/23/2013  Referring physician:  PCP: [name], MD  Specialists:   Chief Complaint: SOB  HPI: Charles [name] is a 73 y.o. male with PMH of CHF, COPD, HTN, A FIB on AC, presented with SOB, PND, orthopnea and DOE for several days found to have CHF; he also reports diffuse abdominal dyscomrt associated with constipation after recently increasing his opioids for DJD; denies nausea vomiting; no chest pain  Review of Systems: The patient denies anorexia, fever, weight loss,, vision loss, decreased hearing, hoarseness, chest pain, syncope, dyspnea on exertion, peripheral edema, balance deficits, hemoptysis, abdominal pain, melena, hematochezia, severe indigestion/heartburn, hematuria, incontinence, genital sores, muscle weakness, suspicious skin lesions, transient blindness, difficulty walking, depression, unusual weight change, abnormal bleeding, enlarged lymph nodes, angioedema, and breast masses.    Past Medical History  Diagnosis Date  . Essential hypertension, benign   . Atrial fibrillation 10/2008  . 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 Peptic ulcer disease   . Degenerative joint disease     Knees and shoulders  . Pneumonia 2010  . Allergic rhinitis   . Anxiety   . Drug abuse   . Tobacco abuse     50 pack years  . Mitral regurgitation     Moderate to severe May 2014  . COPD (chronic obstructive pulmonary disease)   . Follicular lymphoma 09/23/7104  . Basal cell carcinoma    Past Surgical History  Procedure Laterality Date  . Neck lesion biopsy  June 23, 2692    Follicular lymphoma  . Skin cancer excision      under left breast; variously described as melanoma and basal cell  . Rotator cuff repair      right  . Patella fracture surgery Left 1975  . Hematoma evacuation      after melanoma removal  . Portacath  placement  07/12/2010  . Colonoscopy  2009    Also underwent an EGD; patient reports no significant findings  . Bone marrow aspiration  06/2012  . Bone marrow biopsy  06/2012  . Fungal infection    . Port-a-cath removal Left 01/17/2013    Procedure: REMOVAL PORT-A-CATH;  Surgeon: Scherry Ran, MD;  Location: [facility];  Service: General;  Laterality: Left;   Social History:  reports that he quit smoking about 13 months ago. His smoking use included Cigarettes. He has a 50 pack-year smoking history. He has never used smokeless tobacco. He reports that he does not drink alcohol or use illicit drugs. Home;  where does patient live--home, ALF, SNF? and with whom if at home? Yes;  Can patient participate in ADLs?  Allergies  Allergen Reactions  . Statins     Intolerant secondary to severe myalgias    Family History  Problem Relation Age of Onset  . Heart attack Father   . Coronary artery disease Other     (be sure to complete)  Prior to Admission medications   Medication Sig Start Date End Date Taking? Authorizing Provider  ALPRAZolam Duanne Moron) 1 MG tablet Take 0.5 mg by mouth 3 (three) times daily.    Yes Historical Provider, MD  calcium citrate (CALCITRATE - DOSED IN MG ELEMENTAL CALCIUM) 950 MG tablet Take 1 tablet by mouth 2 (two) times daily.   Yes Historical Provider, MD  cholecalciferol (VITAMIN D) 1000 UNITS tablet Take 1,000 Units by mouth every morning.  Yes Historical Provider, MD  digoxin (LANOXIN) 0.125 MG tablet Take 0.125 mg by mouth every morning.   Yes Historical Provider, MD  diltiazem (DILACOR XR) 240 MG 24 hr capsule Take 240 mg by mouth every morning.   Yes Historical Provider, MD  furosemide (LASIX) 20 MG tablet Take 20 mg by mouth daily. *Patient may take an additional tablet if needed*   Yes Historical Provider, MD  HYDROcodone-acetaminophen (NORCO) 10-325 MG per tablet Take 1 tablet by mouth every 6 (six) hours as needed for moderate pain.   Yes Historical  Provider, MD  lovastatin (MEVACOR) 10 MG tablet Take 10 mg by mouth at bedtime.    Yes Historical Provider, MD  omeprazole (PRILOSEC) 20 MG capsule Take 20 mg by mouth every morning.    Yes Historical Provider, MD  potassium chloride (K-DUR) 10 MEQ tablet Take 10 mEq by mouth daily.    Yes Historical Provider, MD  PROAIR HFA 108 (90 BASE) MCG/ACT inhaler Inhale 1 puff into the lungs every 6 (six) hours as needed. For shortness of breath 01/15/12  Yes Historical Provider, MD  sertraline (ZOLOFT) 50 MG tablet Take 50 mg by mouth at bedtime. 08/13/12  Yes Angus Ailene Ravel, MD  tiotropium (SPIRIVA) 18 MCG inhalation capsule Place 18 mcg into inhaler and inhale every other day.    Yes Historical Provider, MD  traMADol (ULTRAM) 50 MG tablet Take 50 mg by mouth 4 (four) times daily as needed for moderate pain or severe pain.    Yes Historical Provider, MD  warfarin (COUMADIN) 5 MG tablet Take 5 mg by mouth as directed. Take 1/2 tablet once daily Sunday-Friday and 1 tablet on Saturday.   Yes Historical Provider, MD  warfarin (COUMADIN) 5 MG tablet Take 5 mg by mouth every evening. TAKES ONE TABLET (5MG TOTAL)ON MON, THURS. TAKES ONE-HALF TABLET (2.5MG TOTAL) ON ALL OTHER DAYS    Historical Provider, MD   Physical Exam: Filed Vitals:   05/23/13 1610  BP: 122/81  Pulse: 83  Temp: 97.5 F (36.4 C)  Resp: 25     General:  alert  Eyes: EOM-i, PErrla   ENT: no oral ulcers   Neck: supple + JVD  Cardiovascular: H6,P5 systolic mr   Respiratory: LL crackles BL  Abdomen: soft, nt, nd   Skin: no rash  Musculoskeletal: mild LE edema  Psychiatric: no hallucinations   Neurologic: CN 2-12 intact, motor 5/5 intact   Labs on Admission:  Basic Metabolic Panel:  Recent Labs Lab 05/23/13 1340  NA 137  K 4.3  CL 100  CO2 25  GLUCOSE 118*  BUN 18  CREATININE 1.17  CALCIUM 9.7   Liver Function Tests: No results found for this basename: AST, ALT, ALKPHOS, BILITOT, PROT, ALBUMIN,  in the last  168 hours No results found for this basename: LIPASE, AMYLASE,  in the last 168 hours No results found for this basename: AMMONIA,  in the last 168 hours CBC:  Recent Labs Lab 05/23/13 1340  WBC 5.3  NEUTROABS 4.1  HGB 11.0*  HCT 33.4*  MCV 91.3  PLT 193   Cardiac Enzymes:  Recent Labs Lab 05/23/13 1358  TROPONINI <0.30    BNP (last 3 results)  Recent Labs  08/13/12 0455 10/26/12 1126 05/23/13 1358  PROBNP 2442.0* 11283.0* 8068.0*   CBG: No results found for this basename: GLUCAP,  in the last 168 hours  Radiological Exams on Admission: Dg Chest Portable 1 View  05/23/2013   CLINICAL DATA:  Shortness of  breath.  EXAM: PORTABLE CHEST - 1 VIEW  COMPARISON:  03/22/2013  FINDINGS: Single view of the chest demonstrates increased interstitial lung markings bilaterally. There is a focal density in the right lower chest which probably represents fissural pleural fluid. Right basilar densities are suggestive atelectasis and pleural fluid. Patient has a right shoulder replacement. Heart size is enlarged.  IMPRESSION: Cardiomegaly with increased interstitial lung markings and right pleural fluid. Findings are most compatible with CHF.   Electronically Signed   By: Markus Daft M.D.   On: 05/23/2013 13:54   Dg Abd 2 Views  05/23/2013   CLINICAL DATA:  Constipation for 3 days.  EXAM: ABDOMEN - 2 VIEW  COMPARISON:  Abdominal pelvic CT 01/03/2013.  FINDINGS: There is mildly prominent stool throughout the colon which is mildly distended. No bowel wall thickening is identified. There is no evidence of small bowel distention or free intraperitoneal air. Diffuse lumbar spondylosis and scattered vascular calcifications are noted.  IMPRESSION: Mild colonic distention and increased stool burden consistent with mild constipation.   Electronically Signed   By: Camie Patience M.D.   On: 05/23/2013 14:50    EKG: Independently reviewed. A fib RBBB  Assessment/Plan Principal Problem:   CHF  exacerbation Active Problems:   TOBACCO ABUSE   Atrial fibrillation   Chronic anticoagulation   Back pain   Chronic diastolic heart failure   Nonischemic cardiomyopathy  73 y.o. male with PMH of CHF, COPD, HTN, A FIB on AC, presented with SOB, PND, orthopnea and DOE for several days found to have CHF  1. Acute on chronic CHF; CXR: congestion;  -echo (07/2012): LVEF 50%, MR, pulmonary HTN, dilated cardiomyopathy -started IV lasix, daily weight, I/O;  Not on ACE yet; re-eval with diuresis   2. A Fib; cont cardizem, dig, coumadin; monitor tele  3. Constipation on opioids; abd exam unremarkable; KUB: Mild colonic distention with constipation  -started bowel regimen, prn enema;   4. COPD, cont bronchodilators, added inhaled steroids; cont spiriva; oxygen desat study [prior to discharge   5. DJD, knees; cont opioids as needed   None;  if consultant consulted, please document name and whether formally or informally consulted  Code Status: full (must indicate code status--if unknown or must be presumed, indicate so) Family Communication: d/w patient, his wife  (indicate person spoken with, if applicable, with phone number if by telephone) Disposition Plan: home 2-3 days  (indicate anticipated LOS)  Time spent: >35 minutes   Kinnie Feil [facility] Pager [phone]  If 7PM-7AM, please contact night-coverage [email or web address] Password [facility] 05/23/2013, 4:46 PM

## 2013-05-23 NOTE — ED Notes (Signed)
Pd RT for treatment

## 2013-05-23 NOTE — ED Provider Notes (Signed)
TIME SEEN: 2:10 PM  CHIEF COMPLAINT: Shortness of breath, constipation  HPI: Patient is a 73 year old with a history of hypertension, ischemic cardiomyopathy, atrial fibrillation on Coumadin, COPD, follicular lymphoma who presents emergency Department with diffuse crampy abdominal pain for the past 3-4 days and a smaller than normal bowel movements.  He is on chronic pain medication which his PCP just recently increased.  He has had 2 small bowel movements this morning and yesterday without blood or melena.  He is also complaining of several days of shortness of breath is worse with exertion.  No chest pain.  No fever.  No lower extremity swelling or pain.  No prior history of PE or DVT.  He is on oxygen chronically at home.  PCP is Dr. Karie Kirks  ROS: See HPI Constitutional: no fever  Eyes: no drainage  ENT: no runny nose   Cardiovascular:  no chest pain  Resp:  SOB  GI: no vomiting GU: no dysuria Integumentary: no rash  Allergy: no hives  Musculoskeletal: no leg swelling  Neurological: no slurred speech ROS otherwise negative  PAST MEDICAL HISTORY/PAST SURGICAL HISTORY:  Past Medical History  Diagnosis Date  . Essential hypertension, benign   . Atrial fibrillation 10/2008  . 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 Peptic ulcer disease   . Degenerative joint disease     Knees and shoulders  . Pneumonia 2010  . Allergic rhinitis   . Anxiety   . Drug abuse   . Tobacco abuse     50 pack years  . Mitral regurgitation     Moderate to severe May 2014  . COPD (chronic obstructive pulmonary disease)   . 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 Basal cell carcinoma     MEDICATIONS:  Prior to Admission medications   Medication Sig Start Date End Date Taking? Authorizing Provider  ALPRAZolam Duanne Moron) 1 MG tablet Take 0.5 mg by mouth 3 (three) times daily.    Yes Historical Provider, MD  calcium citrate (CALCITRATE - DOSED IN MG  ELEMENTAL CALCIUM) 950 MG tablet Take 1 tablet by mouth 2 (two) times daily.   Yes Historical Provider, MD  cholecalciferol (VITAMIN D) 1000 UNITS tablet Take 1,000 Units by mouth every morning.   Yes Historical Provider, MD  digoxin (LANOXIN) 0.125 MG tablet Take 0.125 mg by mouth every morning.   Yes Historical Provider, MD  diltiazem (DILACOR XR) 240 MG 24 hr capsule Take 240 mg by mouth every morning.   Yes Historical Provider, MD  furosemide (LASIX) 20 MG tablet Take 20 mg by mouth daily. *Patient may take an additional tablet if needed*   Yes Historical Provider, MD  HYDROcodone-acetaminophen (NORCO) 10-325 MG per tablet Take 1 tablet by mouth every 6 (six) hours as needed for moderate pain.   Yes Historical Provider, MD  lovastatin (MEVACOR) 10 MG tablet Take 10 mg by mouth at bedtime.    Yes Historical Provider, MD  omeprazole (PRILOSEC) 20 MG capsule Take 20 mg by mouth every morning.    Yes Historical Provider, MD  potassium chloride (K-DUR) 10 MEQ tablet Take 10 mEq by mouth daily.    Yes Historical Provider, MD  PROAIR HFA 108 (90 BASE) MCG/ACT inhaler Inhale 1 puff into the lungs every 6 (six) hours as needed. For shortness of breath 01/15/12  Yes Historical Provider, MD  sertraline (ZOLOFT) 50 MG tablet Take 50 mg by mouth at bedtime. 08/13/12  Yes Angus Ailene Ravel,  MD  tiotropium (SPIRIVA) 18 MCG inhalation capsule Place 18 mcg into inhaler and inhale every other day.    Yes Historical Provider, MD  traMADol (ULTRAM) 50 MG tablet Take 50 mg by mouth 4 (four) times daily as needed for moderate pain or severe pain.    Yes Historical Provider, MD  warfarin (COUMADIN) 5 MG tablet Take 5 mg by mouth as directed. Take 1/2 tablet once daily Sunday-Friday and 1 tablet on Saturday.   Yes Historical Provider, MD  warfarin (COUMADIN) 5 MG tablet Take 5 mg by mouth every evening. TAKES ONE TABLET (5MG  TOTAL)ON MON, THURS. TAKES ONE-HALF TABLET (2.5MG  TOTAL) ON ALL OTHER DAYS    Historical Provider, MD     ALLERGIES:  Allergies  Allergen Reactions  . Statins     Intolerant secondary to severe myalgias    SOCIAL HISTORY:  History  Substance Use Topics  . Smoking status: Former Smoker -- 1.00 packs/day for 50 years    Types: Cigarettes    Quit date: 04/12/2012  . Smokeless tobacco: Never Used  . Alcohol Use: No    FAMILY HISTORY: Family History  Problem Relation Age of Onset  . Heart attack Father   . Coronary artery disease Other     EXAM: BP 121/77  Pulse 79  Temp(Src) 97.7 F (36.5 C) (Oral)  Resp 18  Ht 5\' 7"  (1.702 m)  Wt 192 lb (87.091 kg)  BMI 30.06 kg/m2  SpO2 97% CONSTITUTIONAL: Alert and oriented and responds appropriately to questions.  Elderly, appears uncomfortable but in no apparent distress HEAD: Normocephalic EYES: Conjunctivae clear, PERRL ENT: normal nose; no rhinorrhea; moist mucous membranes; pharynx without lesions noted NECK: Supple, no meningismus, no LAD  CARD: RRR; S1 and S2 appreciated; no murmurs, no clicks, no rubs, no gallops RESP: Normal chest excursion without splinting or tachypnea; breath sounds equal bilaterally with very mild diffuse expiratory wheezing, rales at bases bilaterally, no rhonchi ABD/GI: Normal bowel sounds; non-distended; soft, non-tender, no rebound, no guarding, no tympany RECTAL:  Normal rectal tone, no gross blood, no melena, no stool impaction BACK:  The back appears normal and is non-tender to palpation, there is no CVA tenderness EXT: Normal ROM in all joints; non-tender to palpation; no edema; normal capillary refill; no cyanosis    SKIN: Normal color for age and race; warm NEURO: Moves all extremities equally PSYCH: The patient's mood and manner are appropriate. Grooming and personal hygiene are appropriate.  MEDICAL DECISION MAKING: Patient here with constipation likely from increasing his chronic pain medication.  Exam is not consistent with obstruction but will obtain acute abdominal series.  He states he  is passing gas.  No fecal impaction on exam.  He is also complaining of shortness of breath and has a new oxygen requiring.  Suspect possible CHF versus COPD exacerbation.  We'll give to it, Solu-Medrol and Lasix.  Given his new oxygen requirement, patient will need admission.  Will obtain cardiac labs, chest x-ray.  ED PROGRESS: Patient has mild improvement but pt is still hypoxic on RA.  BNP is greater than 8000.  Chest x-ray shows for CHF exacerbation.  Will discuss with hospitalist for admission.     EKG Interpretation  Date/Time:  Monday May 23 2013 13:48:20 EST Ventricular Rate:  87 PR Interval:    QRS Duration: 160 QT Interval:  416 QTC Calculation: 500 R Axis:   -38 Text Interpretation:  Atrial fibrillation with premature ventricular or aberrantly conducted complexes Left axis deviation Right bundle branch  block Abnormal ECG When compared with ECG of 14-Jan-2013 14:49, No significant change was found Confirmed by Kadeem Hyle,  DO, Ariyona Eid [phone]) on 05/23/2013 2:47:28 PM          [place], DO 05/23/13 1621

## 2013-05-23 NOTE — Progress Notes (Signed)
ANTICOAGULATION CONSULT NOTE - Initial Consult  Pharmacy Consult for Coumadin Indication: atrial fibrillation  Allergies  Allergen Reactions  . Statins     Intolerant secondary to severe myalgias    Patient Measurements: Height: 5\' 7"  (170.2 cm) Weight: 192 lb (87.091 kg) IBW/kg (Calculated) : 66.1  Vital Signs: Temp: 97.5 F (36.4 C) (03/02 1610) Temp src: Oral (03/02 1610) BP: 122/81 mmHg (03/02 1610) Pulse Rate: 83 (03/02 1610)  Labs:  Recent Labs  05/23/13 1340 05/23/13 1358  HGB 11.0*  --   HCT 33.4*  --   PLT 193  --   LABPROT 29.1*  --   INR 2.87*  --   CREATININE 1.17  --   TROPONINI  --  <0.30    Estimated Creatinine Clearance: 59.3 ml/min (by C-G formula based on Cr of 1.17).   Medical History: Past Medical History  Diagnosis Date  . Essential hypertension, benign   . Atrial fibrillation 10/2008  . 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 Peptic ulcer disease   . Degenerative joint disease     Knees and shoulders  . Pneumonia 2010  . Allergic rhinitis   . Anxiety   . Drug abuse   . Tobacco abuse     50 pack years  . Mitral regurgitation     Moderate to severe May 2014  . COPD (chronic obstructive pulmonary disease)   . Follicular lymphoma 06/23/6832  . Basal cell carcinoma     Medications:   (Not in a hospital admission)  Assessment: 73 yo M on chronic warfarin for Afib.  Home dose is  2.5mg  daily except 5mg  on Sat.  INR therapeutic on admission.  No bleeding noted.  Patient has already taken dose today per PTA med list.   Goal of Therapy:  INR 2-3   Plan:  Daily INR  Biagio Borg 05/23/2013,5:21 PM

## 2013-05-23 NOTE — ED Notes (Signed)
Sob, constipation,

## 2013-05-24 DIAGNOSIS — I059 Rheumatic mitral valve disease, unspecified: Secondary | ICD-10-CM

## 2013-05-24 LAB — PROTIME-INR
INR: 2.09 — AB (ref 0.00–1.49)
PROTHROMBIN TIME: 22.8 s — AB (ref 11.6–15.2)

## 2013-05-24 LAB — BASIC METABOLIC PANEL
BUN: 17 mg/dL (ref 6–23)
CHLORIDE: 102 meq/L (ref 96–112)
CO2: 27 mEq/L (ref 19–32)
Calcium: 9.5 mg/dL (ref 8.4–10.5)
Creatinine, Ser: 1.09 mg/dL (ref 0.50–1.35)
GFR calc non Af Amer: 65 mL/min — ABNORMAL LOW (ref >90)
GFR, EST AFRICAN AMERICAN: 76 mL/min — AB (ref >90)
Glucose, Bld: 142 mg/dL — ABNORMAL HIGH (ref 70–99)
Potassium: 3.7 mEq/L (ref 3.7–5.3)
Sodium: 143 mEq/L (ref 137–147)

## 2013-05-24 MED ORDER — ALPRAZOLAM 1 MG PO TABS
1.0000 mg | ORAL_TABLET | Freq: Every day | ORAL | Status: DC
  Administered 2013-05-24: 1 mg via ORAL
  Filled 2013-05-24: qty 1

## 2013-05-24 MED ORDER — WARFARIN SODIUM 2.5 MG PO TABS
2.5000 mg | ORAL_TABLET | Freq: Once | ORAL | Status: AC
  Administered 2013-05-24: 2.5 mg via ORAL
  Filled 2013-05-24: qty 1

## 2013-05-24 MED ORDER — ALPRAZOLAM 0.5 MG PO TABS
0.5000 mg | ORAL_TABLET | Freq: Three times a day (TID) | ORAL | Status: DC
  Administered 2013-05-24 – 2013-05-25 (×2): 0.5 mg via ORAL
  Filled 2013-05-24 (×3): qty 1

## 2013-05-24 MED ORDER — TRAMADOL HCL 50 MG PO TABS
50.0000 mg | ORAL_TABLET | Freq: Three times a day (TID) | ORAL | Status: DC
  Administered 2013-05-24 – 2013-05-25 (×4): 50 mg via ORAL
  Filled 2013-05-24 (×4): qty 1

## 2013-05-24 NOTE — Clinical Documentation Improvement (Signed)
Please clarify respiratory status. Thank you.  Possible Clinical Conditions?  Acute Respiratory Failure Acute on Chronic Respiratory Failure Chronic Respiratory Failure Other Condition Cannot Clinically Determine   Supporting Information: Risk Factors: He is on oxygen chronically at home. History of Nonischemic cardiomyopathy, tobacco abuse, & COPD Admitted with Acute on Chronic Diastolic heart failure Signs & Symptoms: shortness of breath is worse with exertion breath sounds equal bilaterally with very mild diffuse expiratory wheezing, rales at bases bilaterally  Diagnostics: CXR: FINDINGS:  Single view of the chest demonstrates increased interstitial lung  markings bilaterally. There is a focal density in the right lower  chest which probably represents fissural pleural fluid. Right  basilar densities are suggestive atelectasis and pleural fluid.   Treatment: Continuous O2 ; Keep sats >92% cont bronchodilators, added inhaled steroids; cont spiriva; oxygen desat study [prior to discharge   Thank You, Estella Husk ,RN Clinical Documentation Specialist:  [facility]

## 2013-05-24 NOTE — Progress Notes (Signed)
UR chart review completed.  

## 2013-05-24 NOTE — Care Management Note (Addendum)
    Page 1 of 1   05/25/2013     10:36:07 AM   CARE MANAGEMENT NOTE 05/25/2013  Patient:  Charles [name], Charles [name]   Account Number:  [id number]  Date Initiated:  05/24/2013  Documentation initiated by:  Theophilus Kinds  Subjective/Objective Assessment:   Pt admitted from home with CHF. Pt lives with his wife and will return home at discharge. Pt uses a walker at home and also has a BSC, shower chair. Pt requires some assistance with ADL's.     Action/Plan:   Will continue to follow for any discharge planning needs.   Anticipated DC Date:  05/27/2013   Anticipated DC Plan:  [place]  CM consult      Choice offered to / List presented to:             Status of service:  Completed, signed off Medicare Important Message given?  NA - LOS <3 / Initial given by admissions (If response is "NO", the following Medicare IM given date fields will be blank) Date Medicare IM given:   Date Additional Medicare IM given:    Discharge Disposition:  [place]  Per UR Regulation:    If discussed at Long Length of Stay Meetings, dates discussed:    Comments:  05/25/13 [place], RN BSN CM Pt discharged home today. No CM needs noted. Pt stated that he had scales at home and importance of daily wts.  05/24/13 [place], RN BSN CM

## 2013-05-24 NOTE — Progress Notes (Signed)
Nutrition Brief Note  Patient identified on the Malnutrition Screening Tool (MST) Report  Wt Readings from Last 15 Encounters:  05/24/13 190 lb 7.4 oz (86.392 kg)  04/21/13 198 lb 11.2 oz (90.13 kg)  03/22/13 194 lb (87.998 kg)  01/06/13 190 lb 11.2 oz (86.501 kg)  12/30/12 189 lb (85.73 kg)  12/21/12 191 lb (86.637 kg)  11/01/12 177 lb (80.287 kg)  10/28/12 182 lb 8 oz (82.781 kg)  10/06/12 183 lb 9.6 oz (83.28 kg)  09/20/12 194 lb (87.998 kg)  09/07/12 189 lb (85.73 kg)  08/13/12 186 lb 14.4 oz (84.777 kg)  08/03/12 212 lb 4.9 oz (96.3 kg)  07/06/12 199 lb 3.2 oz (90.357 kg)  07/05/12 198 lb (89.812 kg)   Pt admitted with CHF exacerbation. Wt has remained fairly stable over the past year, however, noted fluctuations likely due to diuretic use. UBW: 190#. Wt hx reveals 1% wt loss x 1 year, 0.5% wt loss x 6 months, 2.1% wt loss x 3 months, and 4% wt loss x 1 month, none of which are clinically significant.   Body mass index is 29.82 kg/(m^2). Patient meets criteria for overweight, based on current BMI.   Current diet order is Heart Healthy, patient is consuming approximately 95%% of meals at this time. Labs and medications reviewed.   No nutrition interventions warranted at this time. If nutrition issues arise, please consult RD.   Yasiel Goyne A. Jimmye Norman, RD, LDN Pager: [phone]

## 2013-05-24 NOTE — Progress Notes (Signed)
Dr. Anastasio Champion notified of patients run of Vtach 5 beats. Cardiology was consulted.

## 2013-05-24 NOTE — Progress Notes (Signed)
[facility] for Coumadin Indication: atrial fibrillation  Allergies  Allergen Reactions  . Statins     Intolerant secondary to severe myalgias    Patient Measurements: Height: 5\' 7"  (170.2 cm) Weight: 190 lb 7.4 oz (86.392 kg) IBW/kg (Calculated) : 66.1  Vital Signs: Temp: 97.7 F (36.5 C) (03/03 0428) Temp src: Oral (03/03 0428) BP: 123/78 mmHg (03/03 0428) Pulse Rate: 100 (03/03 0817)  Labs:  Recent Labs  05/23/13 1340 05/23/13 1358 05/24/13 0507  HGB 11.0*  --   --   HCT 33.4*  --   --   PLT 193  --   --   LABPROT 29.1*  --  22.8*  INR 2.87*  --  2.09*  CREATININE 1.17  --  1.09  TROPONINI  --  <0.30  --     Estimated Creatinine Clearance: 63.3 ml/min (by C-G formula based on Cr of 1.09).   Medical History: Past Medical History  Diagnosis Date  . Essential hypertension, benign   . Atrial fibrillation 10/2008  . 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 Peptic ulcer disease   . Degenerative joint disease     Knees and shoulders  . Pneumonia 2010  . Allergic rhinitis   . Anxiety   . Drug abuse   . Tobacco abuse     50 pack years  . Mitral regurgitation     Moderate to severe May 2014  . COPD (chronic obstructive pulmonary disease)   . 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 Basal cell carcinoma     Medications:  Prescriptions prior to admission  Medication Sig Dispense Refill  . ALPRAZolam (XANAX) 1 MG tablet Take 0.5 mg by mouth 3 (three) times daily.       . calcium citrate (CALCITRATE - DOSED IN MG ELEMENTAL CALCIUM) 950 MG tablet Take 1 tablet by mouth 2 (two) times daily.      . cholecalciferol (VITAMIN D) 1000 UNITS tablet Take 1,000 Units by mouth every morning.      . digoxin (LANOXIN) 0.125 MG tablet Take 0.125 mg by mouth every morning.      . diltiazem (DILACOR XR) 240 MG 24 hr capsule Take 240 mg by mouth every morning.      . furosemide (LASIX) 20  MG tablet Take 20 mg by mouth daily. *Patient may take an additional tablet if needed*      . HYDROcodone-acetaminophen (NORCO) 10-325 MG per tablet Take 1 tablet by mouth every 6 (six) hours as needed for moderate pain.      Marland Kitchen lovastatin (MEVACOR) 10 MG tablet Take 10 mg by mouth at bedtime.       Marland Kitchen omeprazole (PRILOSEC) 20 MG capsule Take 20 mg by mouth every morning.       . potassium chloride (K-DUR) 10 MEQ tablet Take 10 mEq by mouth daily.       Marland Kitchen PROAIR HFA 108 (90 BASE) MCG/ACT inhaler Inhale 1 puff into the lungs every 6 (six) hours as needed. For shortness of breath      . sertraline (ZOLOFT) 50 MG tablet Take 50 mg by mouth at bedtime.      Marland Kitchen tiotropium (SPIRIVA) 18 MCG inhalation capsule Place 18 mcg into inhaler and inhale every other day.       . traMADol (ULTRAM) 50 MG tablet Take 50 mg by mouth 4 (four) times daily as needed for moderate pain or severe pain.       Marland Kitchen  warfarin (COUMADIN) 5 MG tablet Take 5 mg by mouth as directed. Take 1/2 tablet once daily Sunday-Friday and 1 tablet on Saturday.        Assessment: 73 yo M on chronic warfarin for Afib.  Home dose is  2.5mg  daily except 5mg  on Sat.  INR therapeutic on admission.  No bleeding noted.   Goal of Therapy:  INR 2-3   Plan:  Coumadin 2.5mg  po x1 today Daily INR  Biagio Borg 05/24/2013,8:37 AM

## 2013-05-24 NOTE — Progress Notes (Signed)
  Charles [name] QQP:[id number] DOB: [birth year] DOA: 05/23/2013 PCP: [name], MD   Subjective: This 73 year old man was admitted yesterday with congestive heart failure. He has chronic atrial fibrillation which was controlled. He does feel better with diuresis overnight.           Physical Exam: Blood pressure 123/78, pulse 87, temperature 97.7 F (36.5 C), temperature source Oral, resp. rate 20, height 5\' 7"  (1.702 m), weight 86.392 kg (190 lb 7.4 oz), SpO2 98.00%. He looks systemically well. There is no increased work of breathing. Heart sounds are present and in atrial fibrillation with a ventricular rate is controlled. Lung fields are actually clear without any evidence of crackles, wheezing or bronchial breathing. There is no significant peripheral pitting edema. He is alert and orientated.   Investigations:  No results found for this or any previous visit (from the past 240 hour(s)).   Basic Metabolic Panel:  Recent Labs  05/23/13 1340 05/24/13 0507  NA 137 143  K 4.3 3.7  CL 100 102  CO2 25 27  GLUCOSE 118* 142*  BUN 18 17  CREATININE 1.17 1.09  CALCIUM 9.7 9.5   Liver Function Tests: No results found for this basename: AST, ALT, ALKPHOS, BILITOT, PROT, ALBUMIN,  in the last 72 hours   CBC:  Recent Labs  05/23/13 1340  WBC 5.3  NEUTROABS 4.1  HGB 11.0*  HCT 33.4*  MCV 91.3  PLT 193    Dg Chest Portable 1 View  05/23/2013   CLINICAL DATA:  Shortness of breath.  EXAM: PORTABLE CHEST - 1 VIEW  COMPARISON:  03/22/2013  FINDINGS: Single view of the chest demonstrates increased interstitial lung markings bilaterally. There is a focal density in the right lower chest which probably represents fissural pleural fluid. Right basilar densities are suggestive atelectasis and pleural fluid. Patient has a right shoulder replacement. Heart size is enlarged.  IMPRESSION: Cardiomegaly with increased interstitial lung markings and right pleural fluid. Findings  are most compatible with CHF.   Electronically Signed   By: Markus Daft M.D.   On: 05/23/2013 13:54   Dg Abd 2 Views  05/23/2013   CLINICAL DATA:  Constipation for 3 days.  EXAM: ABDOMEN - 2 VIEW  COMPARISON:  Abdominal pelvic CT 01/03/2013.  FINDINGS: There is mildly prominent stool throughout the colon which is mildly distended. No bowel wall thickening is identified. There is no evidence of small bowel distention or free intraperitoneal air. Diffuse lumbar spondylosis and scattered vascular calcifications are noted.  IMPRESSION: Mild colonic distention and increased stool burden consistent with mild constipation.   Electronically Signed   By: Camie Patience M.D.   On: 05/23/2013 14:50      Medications: I have reviewed the patient's current medications.  Impression: 1. Congestive heart failure, acute. History of nonischemic cardio myopathy. 2. Chronic atrial fibrillation on chronic anticoagulation therapy. 3. COPD, stable. 4. Hypertension, controlled.     Plan: 1. Continue with current therapy. 2. Await echocardiogram. 3. Cardiology consultation.  Consultants:  Cardiology consultation pending.   Procedures:  None.   Antibiotics:  None.                   Code Status: Full code.  Family Communication: Discussed plan with patient at the bedside.   Disposition Plan: Home when medically stable.  Time spent: 15 minutes.   LOS: 1 day   [place] C   05/24/2013, 8:11 AM

## 2013-05-24 NOTE — Progress Notes (Signed)
  Echocardiogram 2D Echocardiogram has been performed.  Philipp Deputy 05/24/2013, 5:38 PM

## 2013-05-25 LAB — COMPREHENSIVE METABOLIC PANEL
ALBUMIN: 3.7 g/dL (ref 3.5–5.2)
ALK PHOS: 81 U/L (ref 39–117)
ALT: 11 U/L (ref 0–53)
AST: 17 U/L (ref 0–37)
BILIRUBIN TOTAL: 0.5 mg/dL (ref 0.3–1.2)
BUN: 25 mg/dL — ABNORMAL HIGH (ref 6–23)
CHLORIDE: 100 meq/L (ref 96–112)
CO2: 32 mEq/L (ref 19–32)
Calcium: 9.3 mg/dL (ref 8.4–10.5)
Creatinine, Ser: 1.28 mg/dL (ref 0.50–1.35)
GFR calc Af Amer: 62 mL/min — ABNORMAL LOW (ref >90)
GFR calc non Af Amer: 54 mL/min — ABNORMAL LOW (ref >90)
Glucose, Bld: 112 mg/dL — ABNORMAL HIGH (ref 70–99)
POTASSIUM: 3.9 meq/L (ref 3.7–5.3)
Sodium: 143 mEq/L (ref 137–147)
Total Protein: 6.8 g/dL (ref 6.0–8.3)

## 2013-05-25 LAB — PROTIME-INR
INR: 1.9 — AB (ref 0.00–1.49)
Prothrombin Time: 21.2 seconds — ABNORMAL HIGH (ref 11.6–15.2)

## 2013-05-25 LAB — DIGOXIN LEVEL: Digoxin Level: 0.9 ng/mL (ref 0.8–2.0)

## 2013-05-25 MED ORDER — FUROSEMIDE 20 MG PO TABS
20.0000 mg | ORAL_TABLET | Freq: Two times a day (BID) | ORAL | Status: DC
  Administered 2013-05-25: 20 mg via ORAL
  Filled 2013-05-25: qty 1

## 2013-05-25 MED ORDER — FUROSEMIDE 20 MG PO TABS: 20.0000 mg | ORAL_TABLET | Freq: Two times a day (BID) | ORAL | Status: DC

## 2013-05-25 NOTE — Progress Notes (Signed)
Pt is to be discharged home today. Pt is in NAD, IV is out, all paperwork has been reviewed/discussed with patient, and there are no questions/concerns at this time. Assessment is unchanged from this morning. Pt is to be accompanied downstairs by staff and family via wheelchair.  

## 2013-05-25 NOTE — Discharge Summary (Signed)
Physician Discharge Summary  Patient ID: Charles [name] MRN: [id number] DOB/AGE: [birth year] 73 y.o. Primary Care Physician:KNOWLTON,STEPHEN D, MD Admit date: 05/23/2013 Discharge date: 05/25/2013    Discharge Diagnoses:  1. Acute systolic congestive heart failure. 2. Chronic atrial fibrillation on chronic anticoagulation, stable. 3. Nonischemic cardiomyopathy. Ejection fraction currently is reduced at 35-40%.     Medication List         ALPRAZolam 1 MG tablet  Commonly known as:  XANAX  Take 0.5 mg by mouth 3 (three) times daily.     calcium citrate 950 MG tablet  Commonly known as:  CALCITRATE - dosed in mg elemental calcium  Take 1 tablet by mouth 2 (two) times daily.     cholecalciferol 1000 UNITS tablet  Commonly known as:  VITAMIN D  Take 1,000 Units by mouth every morning.     digoxin 0.125 MG tablet  Commonly known as:  LANOXIN  Take 0.125 mg by mouth every morning.     diltiazem 240 MG 24 hr capsule  Commonly known as:  DILACOR XR  Take 240 mg by mouth every morning.     furosemide 20 MG tablet  Commonly known as:  LASIX  Take 1 tablet (20 mg total) by mouth 2 (two) times daily. *Patient may take an additional tablet if needed*     HYDROcodone-acetaminophen 10-325 MG per tablet  Commonly known as:  NORCO  Take 1 tablet by mouth every 6 (six) hours as needed for moderate pain.     lovastatin 10 MG tablet  Commonly known as:  MEVACOR  Take 10 mg by mouth at bedtime.     omeprazole 20 MG capsule  Commonly known as:  PRILOSEC  Take 20 mg by mouth every morning.     potassium chloride 10 MEQ tablet  Commonly known as:  K-DUR  Take 10 mEq by mouth daily.     PROAIR HFA 108 (90 BASE) MCG/ACT inhaler  Generic drug:  albuterol  Inhale 1 puff into the lungs every 6 (six) hours as needed. For shortness of breath     sertraline 50 MG tablet  Commonly known as:  ZOLOFT  Take 50 mg by mouth at bedtime.     tiotropium 18 MCG inhalation capsule  Commonly  known as:  SPIRIVA  Place 18 mcg into inhaler and inhale every other day.     traMADol 50 MG tablet  Commonly known as:  ULTRAM  Take 50 mg by mouth 4 (four) times daily as needed for moderate pain or severe pain.     warfarin 5 MG tablet  Commonly known as:  COUMADIN  Take 5 mg by mouth as directed. Take 1/2 tablet once daily Sunday-Friday and 1 tablet on Saturday.        Discharged Condition: Stable.    Consults: None.  Significant Diagnostic Studies: Dg Chest Portable 1 View  05/23/2013   CLINICAL DATA:  Shortness of breath.  EXAM: PORTABLE CHEST - 1 VIEW  COMPARISON:  03/22/2013  FINDINGS: Single view of the chest demonstrates increased interstitial lung markings bilaterally. There is a focal density in the right lower chest which probably represents fissural pleural fluid. Right basilar densities are suggestive atelectasis and pleural fluid. Patient has a right shoulder replacement. Heart size is enlarged.  IMPRESSION: Cardiomegaly with increased interstitial lung markings and right pleural fluid. Findings are most compatible with CHF.   Electronically Signed   By: Markus Daft M.D.   On: 05/23/2013 13:54   Dg Abd  2 Views  05/23/2013   CLINICAL DATA:  Constipation for 3 days.  EXAM: ABDOMEN - 2 VIEW  COMPARISON:  Abdominal pelvic CT 01/03/2013.  FINDINGS: There is mildly prominent stool throughout the colon which is mildly distended. No bowel wall thickening is identified. There is no evidence of small bowel distention or free intraperitoneal air. Diffuse lumbar spondylosis and scattered vascular calcifications are noted.  IMPRESSION: Mild colonic distention and increased stool burden consistent with mild constipation.   Electronically Signed   By: Camie Patience M.D.   On: 05/23/2013 14:50    Lab Results: Basic Metabolic Panel:  Recent Labs  05/24/13 0507 05/25/13 0518  NA 143 143  K 3.7 3.9  CL 102 100  CO2 27 32  GLUCOSE 142* 112*  BUN 17 25*  CREATININE 1.09 1.28  CALCIUM  9.5 9.3   Liver Function Tests:  Recent Labs  05/25/13 0518  AST 17  ALT 11  ALKPHOS 81  BILITOT 0.5  PROT 6.8  ALBUMIN 3.7     CBC:  Recent Labs  05/23/13 1340  WBC 5.3  NEUTROABS 4.1  HGB 11.0*  HCT 33.4*  MCV 91.3  PLT 193       Hospital Course: This 73 year old man was admitted with symptoms of dyspnea. Please see his initial history as outlined below: Charles [name] is a 73 y.o. male with PMH of CHF, COPD, HTN, A FIB on AC, presented with SOB, PND, orthopnea and DOE for several days found to have CHF; he also reports diffuse abdominal dyscomrt associated with constipation after recently increasing his opioids for DJD; denies nausea vomiting; no chest pain During hospitalization serial cardiac enzymes were negative. He did well with intravenous diuretics and he feels now back to his normal self in terms of his dyspnea. His chronic atrial fibrillation remained rate controlled and he remained on Coumadin. An echocardiogram showed an ejection fraction of 35-40%, which is a reduction from previous. This will be addressed as an outpatient with cardiology. They will see him in the next week or 2. Discharge Exam: Blood pressure 113/68, pulse 80, temperature 98.2 F (36.8 C), temperature source Oral, resp. rate 20, height 5\' 7"  (1.702 m), weight 85.964 kg (189 lb 8.3 oz), SpO2 98.00%. He looks systemically well. There is no work of breathing at rest. Heart sounds are present in atrial fibrillation, rate controlled. Lung fields are entirely clear now with no evidence of crackles, wheezing or bronchial breathing. He is alert and orientated.  Disposition: Home.      Discharge Orders   Future Appointments Provider Department Dept Phone   05/26/2013 3:40 PM [place] [facility] [phone]   08/18/2013 10:20 AM [place] [phone]   08/18/2013 10:30 AM [facility], PA-C [facility] [phone]   Future  Orders Complete By Expires   Diet - low sodium heart healthy  As directed    Increase activity slowly  As directed       Follow-up Information   Follow up with Rozann Lesches, MD. Schedule an appointment as soon as possible for a visit in 1 week.   Specialty:  Cardiology   Contact information:   [place] [address] [phone]       Signed: Doree Albee   05/25/2013, 8:08 AM

## 2013-05-25 NOTE — Consult Note (Deleted)
    CARDIOLOGY CONSULT NOTE   Error. Disregard.

## 2013-05-30 ENCOUNTER — Ambulatory Visit (INDEPENDENT_AMBULATORY_CARE_PROVIDER_SITE_OTHER): Payer: Medicare Other | Admitting: *Deleted

## 2013-05-30 DIAGNOSIS — I4891 Unspecified atrial fibrillation: Secondary | ICD-10-CM

## 2013-05-30 DIAGNOSIS — Z5181 Encounter for therapeutic drug level monitoring: Secondary | ICD-10-CM

## 2013-05-30 LAB — POCT INR: INR: 2.2

## 2013-06-08 ENCOUNTER — Ambulatory Visit (INDEPENDENT_AMBULATORY_CARE_PROVIDER_SITE_OTHER): Payer: Medicare Other | Admitting: Adult Health

## 2013-06-08 ENCOUNTER — Encounter: Payer: Self-pay | Admitting: *Deleted

## 2013-06-08 ENCOUNTER — Encounter: Payer: Self-pay | Admitting: Adult Health

## 2013-06-08 VITALS — BP 128/85 | HR 60 | Ht 67.0 in | Wt 192.8 lb

## 2013-06-08 DIAGNOSIS — R079 Chest pain, unspecified: Secondary | ICD-10-CM

## 2013-06-08 DIAGNOSIS — I10 Essential (primary) hypertension: Secondary | ICD-10-CM

## 2013-06-08 DIAGNOSIS — I428 Other cardiomyopathies: Secondary | ICD-10-CM

## 2013-06-08 DIAGNOSIS — I4891 Unspecified atrial fibrillation: Secondary | ICD-10-CM

## 2013-06-08 MED ORDER — DILTIAZEM HCL ER COATED BEADS 180 MG PO CP24: 180.0000 mg | ORAL_CAPSULE | Freq: Every day | ORAL | Status: AC

## 2013-06-08 MED ORDER — METOPROLOL TARTRATE 25 MG PO TABS
25.0000 mg | ORAL_TABLET | Freq: Two times a day (BID) | ORAL | Status: DC
Start: 2013-06-08 — End: 2013-06-16

## 2013-06-08 NOTE — Assessment & Plan Note (Signed)
Blood pressure is low normal for EF of 35%. No other changes in his medications at this time.

## 2013-06-08 NOTE — Progress Notes (Signed)
HPI: [name]  he is a 73 year old patient of Dr. Domenic Polite we are following for ongoing assessment and management of chronic systolic CHF, atrial fibrillation on chronic anticoagulation, a nonischemic cardiomyopathy with an EF of 35-40%.    He was recently admitted to [facility] in the setting of dyspnea. He was treated for CHF. He complained of abdominal discomfort associated with constipation recently increasing opioids for DJD. He was ruled out for myocardial infarction and ACS during hospitalization. He was continued on Coumadin. His heart rate was well-controlled. Echocardiogram during hospitalization confirmed EF of 35-40%. He was treated with intravenous diuretics, with a discharge weight of 189 pounds.    He comes today for pre-op evaluation for R TKA. Pt states that the surgery is planned for July of 2015. He will be having spinal block due to "my heart problems."      He has felt some fluid retention but has not had significant weight gain. He has some DOE which is chronic for him. He is not on O2.    Other symptoms have included mild heartburn. Non-limiting.  Allergies  Allergen Reactions  . Statins     Intolerant secondary to severe myalgias    Current Outpatient Prescriptions  Medication Sig Dispense Refill  . ALPRAZolam (XANAX) 1 MG tablet Take 0.5 mg by mouth 3 (three) times daily.       . calcium citrate (CALCITRATE - DOSED IN MG ELEMENTAL CALCIUM) 950 MG tablet Take 1 tablet by mouth 2 (two) times daily.      . cholecalciferol (VITAMIN D) 1000 UNITS tablet Take 1,000 Units by mouth every morning.      . digoxin (LANOXIN) 0.125 MG tablet Take 0.125 mg by mouth every morning.      . furosemide (LASIX) 20 MG tablet Take 1 tablet (20 mg total) by mouth 2 (two) times daily. *Patient may take an additional tablet if needed*  60 tablet  0  . HYDROcodone-acetaminophen (NORCO) 10-325 MG per tablet Take 1 tablet by mouth every 6 (six) hours as needed for moderate pain.       Marland Kitchen lovastatin (MEVACOR) 10 MG tablet Take 10 mg by mouth at bedtime.       Marland Kitchen omeprazole (PRILOSEC) 20 MG capsule Take 20 mg by mouth every morning.       . potassium chloride (K-DUR) 10 MEQ tablet Take 10 mEq by mouth daily.       Marland Kitchen PROAIR HFA 108 (90 BASE) MCG/ACT inhaler Inhale 1 puff into the lungs every 6 (six) hours as needed. For shortness of breath      . sertraline (ZOLOFT) 50 MG tablet Take 50 mg by mouth at bedtime.      Marland Kitchen tiotropium (SPIRIVA) 18 MCG inhalation capsule Place 18 mcg into inhaler and inhale every other day.       . traMADol (ULTRAM) 50 MG tablet Take 50 mg by mouth 4 (four) times daily as needed for moderate pain or severe pain.       Marland Kitchen warfarin (COUMADIN) 5 MG tablet Take 5 mg by mouth as directed. Take 1/2 tablet once daily Sunday-Friday and 1 tablet on Saturday.      . diltiazem (CARDIZEM CD) 180 MG 24 hr capsule Take 1 capsule (180 mg total) by mouth daily.  90 capsule  3  . metoprolol tartrate (LOPRESSOR) 25 MG tablet Take 1 tablet (25 mg total) by mouth 2 (two) times daily.  180 tablet  3   No current  facility-administered medications for this visit.   Facility-Administered Medications Ordered in Other Visits  Medication Dose Route Frequency Provider Last Rate Last Dose  . sodium chloride 0.9 % injection 10 mL  10 mL Intracatheter PRN Pieter Partridge, MD   10 mL at 10/15/10 1230    Past Medical History  Diagnosis Date  . Essential hypertension, benign   . Atrial fibrillation 10/2008  . 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 Peptic ulcer disease   . Degenerative joint disease     Knees and shoulders  . Pneumonia 2010  . Allergic rhinitis   . Anxiety   . Drug abuse   . Tobacco abuse     50 pack years  . Mitral regurgitation     Moderate to severe May 2014  . COPD (chronic obstructive pulmonary disease)   . 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 Basal cell carcinoma     Past Surgical History    Procedure Laterality Date  . Neck lesion biopsy  June 24, 4534    Follicular lymphoma  . Skin cancer excision      under left breast; variously described as melanoma and basal cell  . Rotator cuff repair      right  . Patella fracture surgery Left 1975  . Hematoma evacuation      after melanoma removal  . Portacath placement  07/12/2010  . Colonoscopy  2009    Also underwent an EGD; patient reports no significant findings  . Bone marrow aspiration  06/2012  . Bone marrow biopsy  06/2012  . Fungal infection    . Port-a-cath removal Left 01/17/2013    Procedure: REMOVAL PORT-A-CATH;  Surgeon: Scherry Ran, MD;  Location: [facility];  Service: General;  Laterality: Left;    ROS: Review of systems complete and found to be negative unless listed above  PHYSICAL EXAM BP 128/85  Pulse 60  Ht 5' 7" (1.702 m)  Wt 192 lb 12.8 oz (87.454 kg)  BMI 30.19 kg/m2  General: Well developed, well nourished, in no acute distress, somewhat frail.  Head: Eyes PERRLA, No xanthomas.   Normal cephalic and atramatic  Lungs:  Mild bibasilar rales, no wheezes or coughing. Heart: HRIR S1 S2, without MRG.  Pulses are 2+ & equal.            No carotid bruit. No JVD.  No abdominal bruits. No femoral bruits. Abdomen: Bowel sounds are positive, abdomen soft and nON-tender without masses or                  Hernia's noted. Msk:  Back normal, using walker and favoring his left leg.. Normal strength and tone for age. Extremities: No clubbing, cyanosis or edema. Left knee mildly swollen and tender to touch with no evidence of cellulitis.  DP +1 Neuro: Alert and oriented X 3. Psych:  Good affect, responds appropriately  :  ASSESSMENT AND PLAN

## 2013-06-08 NOTE — Assessment & Plan Note (Signed)
Heart rate is well controlled currently. I have discussed this patient with Dr. Domenic Polite on site. We are concerned about decreased EF to 35%, with use of diltazem for rate control. Will wean down the diltiazem to 180 mg and begin low dose metoprolol 25 mg BID. He is to call us for episodes of wheezing or worsening dyspnea with medication change.  He will continue on coumadin therapy for now.

## 2013-06-08 NOTE — Assessment & Plan Note (Signed)
He has had a significant change in his EF from May of 2014 from 55% to 35%. After discussion with patient, he is experiencing some heart burn symptoms. I will plan a Lexiscan Cardiolite for evaluation of ischemia, with changes in EF. He is not ready to be cleared to have L TKR until we have fully evaluated him, despite spinal block anesthesia.   He will see Dr. Domenic Polite in April on previously planned appt for further recommendations and discussion of test results.

## 2013-06-08 NOTE — Patient Instructions (Addendum)
Your physician recommends that you schedule a follow-up appointment in: [place] has requested that you have a lexiscan myoview. For further information please visit [email or web address]. Please follow instruction sheet, as given.   STOP DILTIAZEM 240 MG   START DILTIAZEM 180 MG ONCE DAILY  WEIGH EVERY MORNING   START METOPROLOL TART 25 MG ONE TABLET TWICE DAILY    1.5 Gram Low Sodium Diet A 1.5 gram sodium diet restricts the amount of sodium in the diet to no more than 1.5 g or 1500 mg daily. The American Heart Association recommends Americans over the age of 66 to consume no more than 1500 mg of sodium each day to reduce the risk of developing high blood pressure. Research also shows that limiting sodium may reduce heart attack and stroke risk. Many foods contain sodium for flavor and sometimes as a preservative. When the amount of sodium in a diet needs to be low, it is important to know what to look for when choosing foods and drinks. The following includes some information and guidelines to help make it easier for you to adapt to a low sodium diet. QUICK TIPS  Do not add salt to food.  Avoid convenience items and fast food.  Choose unsalted snack foods.  Buy lower sodium products, often labeled as "lower sodium" or "no salt added."  Check food labels to learn how much sodium is in 1 serving.  When eating at a restaurant, ask that your food be prepared with less salt or none, if possible. READING FOOD LABELS FOR SODIUM INFORMATION The nutrition facts label is a good place to find how much sodium is in foods. Look for products with no more than 400 mg of sodium per serving. Remember that 1.5 g = 1500 mg. The food label may also list foods as:  Sodium-free: Less than 5 mg in a serving.  Very low sodium: 35 mg or less in a serving.  Low-sodium: 140 mg or less in a serving.  Light in sodium: 50% less sodium in a serving. For example, if a  food that usually has 300 mg of sodium is changed to become light in sodium, it will have 150 mg of sodium.  Reduced sodium: 25% less sodium in a serving. For example, if a food that usually has 400 mg of sodium is changed to reduced sodium, it will have 300 mg of sodium. CHOOSING FOODS Grains  Avoid: Salted crackers and snack items. Some cereals, including instant hot cereals. Bread stuffing and biscuit mixes. Seasoned rice or pasta mixes.  Choose: Unsalted snack items. Low-sodium cereals, oats, puffed wheat and rice, shredded wheat. English muffins and bread. Pasta. Meats  Avoid: Salted, canned, smoked, spiced, pickled meats, including fish and poultry. Bacon, ham, sausage, cold cuts, hot dogs, anchovies.  Choose: Low-sodium canned tuna and salmon. Fresh or frozen meat, poultry, and fish. Dairy  Avoid: Processed cheese and spreads. Cottage cheese. Buttermilk and condensed milk. Regular cheese.  Choose: Milk. Low-sodium cottage cheese. Yogurt. Sour cream. Low-sodium cheese. Fruits and Vegetables  Avoid: Regular canned vegetables. Regular canned tomato sauce and paste. Frozen vegetables in sauces. Olives. Angie Fava. Relishes. Sauerkraut.  Choose: Low-sodium canned vegetables. Low-sodium tomato sauce and paste. Frozen or fresh vegetables. Fresh and frozen fruit. Condiments  Avoid: Canned and packaged gravies. Worcestershire sauce. Tartar sauce. Barbecue sauce. Soy sauce. Steak sauce. Ketchup. Onion, garlic, and table salt. Meat flavorings and tenderizers.  Choose: Fresh and dried herbs and spices. Low-sodium varieties  of mustard and ketchup. Lemon juice. Tabasco sauce. Horseradish. SAMPLE 1.5 GRAM SODIUM MEAL PLAN Breakfast / Sodium (mg)  1 cup low-fat milk / 143 mg  1 whole-wheat English muffin / 240 mg  1 tbs heart-healthy margarine / 153 mg  1 hard-boiled egg / 139 mg  1 small orange / 0 mg Lunch / Sodium (mg)  1 cup raw carrots / 76 mg  2 tbs no salt added peanut butter  / 5 mg  2 slices whole-wheat bread / 270 mg  1 tbs jelly / 6 mg   cup red grapes / 2 mg Dinner / Sodium (mg)  1 cup whole-wheat pasta / 2 mg  1 cup low-sodium tomato sauce / 73 mg  3 oz lean ground beef / 57 mg  1 small side salad (1 cup raw spinach leaves,  cup cucumber,  cup yellow bell pepper) with 1 tsp olive oil and 1 tsp red wine vinegar / 25 mg Snack / Sodium (mg)  1 container low-fat vanilla yogurt / 107 mg  3 graham cracker squares / 127 mg Nutrient Analysis  Calories: 1745  Protein: 75 g  Carbohydrate: 237 g  Fat: 57 g  Sodium: 1425 mg Document Released: 03/10/2005 Document Revised: 06/02/2011 Document Reviewed: 06/11/2009 [employer] Patient Information 2014 [place], [place].

## 2013-06-09 ENCOUNTER — Other Ambulatory Visit: Payer: Self-pay

## 2013-06-09 DIAGNOSIS — R079 Chest pain, unspecified: Secondary | ICD-10-CM

## 2013-06-16 ENCOUNTER — Encounter: Payer: Self-pay | Admitting: Adult Health

## 2013-06-16 ENCOUNTER — Ambulatory Visit (INDEPENDENT_AMBULATORY_CARE_PROVIDER_SITE_OTHER): Payer: Medicare Other | Admitting: Adult Health

## 2013-06-16 ENCOUNTER — Encounter (HOSPITAL_COMMUNITY): Payer: Medicare Other

## 2013-06-16 ENCOUNTER — Other Ambulatory Visit (HOSPITAL_COMMUNITY): Payer: Medicare Other

## 2013-06-16 ENCOUNTER — Ambulatory Visit (INDEPENDENT_AMBULATORY_CARE_PROVIDER_SITE_OTHER): Payer: Medicare Other | Admitting: *Deleted

## 2013-06-16 VITALS — BP 137/90 | HR 78 | Ht 67.0 in | Wt 190.0 lb

## 2013-06-16 DIAGNOSIS — I10 Essential (primary) hypertension: Secondary | ICD-10-CM

## 2013-06-16 DIAGNOSIS — I4891 Unspecified atrial fibrillation: Secondary | ICD-10-CM

## 2013-06-16 DIAGNOSIS — I5032 Chronic diastolic (congestive) heart failure: Secondary | ICD-10-CM

## 2013-06-16 DIAGNOSIS — Z5181 Encounter for therapeutic drug level monitoring: Secondary | ICD-10-CM

## 2013-06-16 DIAGNOSIS — Z7901 Long term (current) use of anticoagulants: Secondary | ICD-10-CM

## 2013-06-16 LAB — POCT INR: INR: 1.6

## 2013-06-16 NOTE — Assessment & Plan Note (Signed)
Excellent control of blood pressure currently he will remain on current medication regimen at this time. Can consider adjustment if necessary on followup appointment. He will be seen by Dr. Domenic Polite in 4 months.

## 2013-06-16 NOTE — Progress Notes (Signed)
HPI: [name] is a 55 30 patient Dr. Domenic Polite were following for ongoing assessment and management of chronic systolic CHF, chronic atrial fibrillation on anticoagulation, and nonischemic cardiomyopathy with an EF of 35-40%. He was last seen in the office in March of 2015, posthospitalization in the setting of CHF. Followup echocardiogram during that time confirmed EF of 35-40% and he was continued on diuretics.   At that time his heart rate was well-controlled, but do to ejection fraction 35% on diltiazem we have weaned down with diltiazem 240 mg, and begun low-dose metoprolol 25 mg twice a day. He was to call his for episodes of wheezing or worsening dyspnea with medication changes.   He is also plan for total knee replacement once he is cleared from a cardiac standpoint. The patient will have spinal block for surgery and not full anesthesia. A stress Myoview was planned, but has not yet been completed.   He followed up with Dr. Karie Kirks in his office early this week and was found to have worsening shortness of breath and some fluid retention. Dr. Karie Kirks wishes that he be seen today for further evaluation secondary medication change in patient's symptoms.   On the visit with Dr. Karie Kirks, the patient's metoprolol was discontinued. The patient states he was having blurred vision, profound fatigue, and depression associated with it. Since stopping the metoprolol he has felt much better. Dr. Karie Kirks did not go up on the diltiazem, back to his original dose before we reduced it to 180 mg daily. We reduced the dose with the addition of the metoprolol.   He states he is feeling better, his energy is back is not having any worsening or extremity edema, his breathing status has improved significantly. He also states that he wishes to postpone his left knee surgery as he is feeling so much better. Does not want to undergo another surgery and feel bad again for a while. He wants to think about doing this  again in about 6 months.  Allergies  Allergen Reactions  . Metoprolol Shortness Of Breath    Intolerant to all BB  . Statins     Intolerant secondary to severe myalgias    Current Outpatient Prescriptions  Medication Sig Dispense Refill  . ALPRAZolam (XANAX) 1 MG tablet Take 0.5 mg by mouth 3 (three) times daily.       . calcium citrate (CALCITRATE - DOSED IN MG ELEMENTAL CALCIUM) 950 MG tablet Take 1 tablet by mouth 2 (two) times daily.      . cholecalciferol (VITAMIN D) 1000 UNITS tablet Take 1,000 Units by mouth every morning.      . digoxin (LANOXIN) 0.125 MG tablet Take 0.125 mg by mouth every morning.      . diltiazem (CARDIZEM CD) 180 MG 24 hr capsule Take 1 capsule (180 mg total) by mouth daily.  90 capsule  3  . furosemide (LASIX) 20 MG tablet Take 1 tablet (20 mg total) by mouth 2 (two) times daily. *Patient may take an additional tablet if needed*  60 tablet  0  . HYDROcodone-acetaminophen (NORCO) 10-325 MG per tablet Take 1 tablet by mouth every 6 (six) hours as needed for moderate pain.      Marland Kitchen lovastatin (MEVACOR) 10 MG tablet Take 10 mg by mouth at bedtime.       Marland Kitchen omeprazole (PRILOSEC) 20 MG capsule Take 20 mg by mouth every morning.       . potassium chloride (K-DUR) 10 MEQ tablet Take 10  mEq by mouth daily.       Marland Kitchen PROAIR HFA 108 (90 BASE) MCG/ACT inhaler Inhale 1 puff into the lungs every 6 (six) hours as needed. For shortness of breath      . sertraline (ZOLOFT) 50 MG tablet Take 50 mg by mouth at bedtime.      Marland Kitchen tiotropium (SPIRIVA) 18 MCG inhalation capsule Place 18 mcg into inhaler and inhale every other day.       . traMADol (ULTRAM) 50 MG tablet Take 50 mg by mouth 4 (four) times daily as needed for moderate pain or severe pain.       Marland Kitchen warfarin (COUMADIN) 5 MG tablet Take 5 mg by mouth as directed. Take 1/2 tablet once daily _0  pack years  . Mitral regurgitation     Moderate to severe May 2014  . COPD (chronic obstructive pulmonary disease)   . Follicular lymphoma 7/0/9628  . Basal cell carcinoma     Past Surgical History  Procedure Laterality Date  . Neck lesion biopsy  June 23, 3660    Follicular lymphoma  . Skin cancer excision      under left breast; variously described as melanoma and basal cell  . Rotator cuff repair      right  . Patella fracture surgery Left 1975  . Hematoma evacuation      after melanoma removal  . Portacath placement  07/12/2010  . Colonoscopy  2009    Also underwent an EGD; patient reports no significant findings  . Bone marrow aspiration  06/2012  . Bone marrow biopsy  06/2012  . Fungal infection    . Port-a-cath removal Left 01/17/2013    Procedure: REMOVAL PORT-A-CATH;  Surgeon: Scherry Ran, MD;  Location: [facility];  Service: General;  Laterality: Left;    HUT:MLYYTK of systems complete and found to be negative unless listed above  PHYSICAL EXAM BP 137/90  Pulse 78  Ht _1  (1.702 m)  Wt 190 lb (86.183 kg)  BMI 29.75 kg/m2  SpO2 98%  General: Well developed, well nourished, in no acute distress Head: Eyes PERRLA, No xanthomas.   Normal cephalic and atramatic  Lungs: Essentially clear to auscultation with some mild rails in the right base. Heart: HIRR S1 S2, without MRG.   Pulses are 2+ & equal.            No carotid bruit. No JVD.  No abdominal bruits. No femoral bruits. Abdomen: Bowel sounds are positive, abdomen soft and non-tender without masses or                  Hernia's noted. Msk:  Back normal,using walker  for ambulation. Normal strength and tone for age. Extremities: No clubbing, cyanosis or edema. He does have some enlargement of the left knee and arthritis.  DP +1 Neuro: Alert and oriented X 3. Psych:  Good affect, responds appropriately    ASSESSMENT AND PLAN

## 2013-06-16 NOTE — Assessment & Plan Note (Signed)
He absolutely did not tolerate use of beta blockers. He became worse. He had wheeze, though depressed, dizzy, and having blurred vision. He was appropriately taken off the beta blocker by Dr. Karie Kirks and began to feel better within 24 hours. Beta blockers will now be added to his allergy list as an intolerant medication.  Diltiazem and remained in 180 mg, from the 240 mg he was on previously. He actually is feeling much better on it his heart rate is controlled along with his blood pressure. His energy level has returned. The lower extremity edema has improved. With low EF, the lower the better concerning his dosing on his calcium channel blocker.  He will followup with Dr. Domenic Polite in 4 months. At which time he will discuss with him whether he plans on proceeding with left knee replacement. Any further cardiac testing prior to that surgery for cardiac clearance will be discussed at that visit.

## 2013-06-16 NOTE — Patient Instructions (Addendum)
Your physician wants you to follow-up in: 4 months with Dr.McDowell You will receive a reminder letter in the mail two months in advance. If you don't receive a letter, please call our office to schedule the follow-up appointment.    Your physician recommends that you continue on your current medications as directed. Please refer to the Current Medication list given to you today.     Thank you for choosing [facility] !    

## 2013-06-16 NOTE — Assessment & Plan Note (Signed)
There is no evidence of fluid overload at this time . He is weighing himself daily and has not gained more than 1 pound over the last week. The patient is tolerating the Lanoxin, and Lasix as directed. With reduced EF we will keep him at the lowest dose possible of calcium channel blocker. As stated he is not a candidate for a beta blocker as he is significantly intolerant to this.

## 2013-06-16 NOTE — Progress Notes (Deleted)
Name: Charles [name]    DOB: 01/20/1941  Age: 73 y.o.  MR#: [id number]       PCP:  [name], MD      Insurance: Payor: MEDICARE / Plan: MEDICARE PART A AND B / Product Type: *No Product type* /   CC:    Chief Complaint  Patient presents with  . Atrial Fibrillation  . Hypertension    VS Filed Vitals:   06/16/13 1539  BP: 137/90  Pulse: 78  Height: 5\' 7"  (1.702 m)  Weight: 190 lb (86.183 kg)  SpO2: 98%    Weights Current Weight  06/16/13 190 lb (86.183 kg)  06/08/13 192 lb 12.8 oz (87.454 kg)  05/25/13 189 lb 8.3 oz (85.964 kg)    Blood Pressure  BP Readings from Last 3 Encounters:  06/16/13 137/90  06/08/13 128/85  05/25/13 114/72     Admit date:  (Not on file) Last encounter with [facility]:  06/08/2013   Allergy Statins  Current Outpatient Prescriptions  Medication Sig Dispense Refill  . ALPRAZolam (XANAX) 1 MG tablet Take 0.5 mg by mouth 3 (three) times daily.       . calcium citrate (CALCITRATE - DOSED IN MG ELEMENTAL CALCIUM) 950 MG tablet Take 1 tablet by mouth 2 (two) times daily.      . cholecalciferol (VITAMIN D) 1000 UNITS tablet Take 1,000 Units by mouth every morning.      . digoxin (LANOXIN) 0.125 MG tablet Take 0.125 mg by mouth every morning.      . diltiazem (CARDIZEM CD) 180 MG 24 hr capsule Take 1 capsule (180 mg total) by mouth daily.  90 capsule  3  . furosemide (LASIX) 20 MG tablet Take 1 tablet (20 mg total) by mouth 2 (two) times daily. *Patient may take an additional tablet if needed*  60 tablet  0  . HYDROcodone-acetaminophen (NORCO) 10-325 MG per tablet Take 1 tablet by mouth every 6 (six) hours as needed for moderate pain.      Marland Kitchen lovastatin (MEVACOR) 10 MG tablet Take 10 mg by mouth at bedtime.       Marland Kitchen omeprazole (PRILOSEC) 20 MG capsule Take 20 mg by mouth every morning.       . potassium chloride (K-DUR) 10 MEQ tablet Take 10 mEq by mouth daily.       Marland Kitchen PROAIR HFA 108 (90 BASE) MCG/ACT inhaler Inhale 1 puff into the lungs every 6 (six)  hours as needed. For shortness of breath      . sertraline (ZOLOFT) 50 MG tablet Take 50 mg by mouth at bedtime.      Marland Kitchen tiotropium (SPIRIVA) 18 MCG inhalation capsule Place 18 mcg into inhaler and inhale every other day.       . traMADol (ULTRAM) 50 MG tablet Take 50 mg by mouth 4 (four) times daily as needed for moderate pain or severe pain.       Marland Kitchen warfarin (COUMADIN) 5 MG tablet Take 5 mg by mouth as directed. Take 1/2 tablet once daily Sunday-Friday and 1 tablet on Saturday.       No current facility-administered medications for this visit.   Facility-Administered Medications Ordered in Other Visits  Medication Dose Route Frequency Provider Last Rate Last Dose  . sodium chloride 0.9 % injection 10 mL  10 mL Intracatheter PRN Pieter Partridge, MD   10 mL at 10/15/10 1230    Discontinued Meds:    Medications Discontinued During This Encounter  Medication Reason  .  metoprolol tartrate (LOPRESSOR) 25 MG tablet Error    Patient Active Problem List   Diagnosis Date Noted  . Encounter for therapeutic drug monitoring 04/25/2013  . OA (osteoarthritis) of knee 12/30/2012  . Mitral regurgitation 10/27/2012  . Chronic diastolic heart failure 37/12/6267  . Calcium pyrophosphate crystal disease 09/07/2012  . Back pain 09/07/2012  . Hyperlipidemia 04/17/2011  . Follicular lymphoma 48/54/6270  . Atrial fibrillation 06/21/2010  . Chronic anticoagulation 06/21/2010  . Nonischemic cardiomyopathy 02/21/2009  . TOBACCO ABUSE 11/22/2008  . INSOMNIA, CHRONIC 11/22/2008  . GASTRIC ULCER, HX OF 11/22/2008  . Essential hypertension, benign 11/08/2008    LABS    Component Value Date/Time   NA 143 05/25/2013 0518   NA 143 05/24/2013 0507   NA 137 05/23/2013 1340   K 3.9 05/25/2013 0518   K 3.7 05/24/2013 0507   K 4.3 05/23/2013 1340   CL 100 05/25/2013 0518   CL 102 05/24/2013 0507   CL 100 05/23/2013 1340   CO2 32 05/25/2013 0518   CO2 27 05/24/2013 0507   CO2 25 05/23/2013 1340   GLUCOSE 112* 05/25/2013 0518    GLUCOSE 142* 05/24/2013 0507   GLUCOSE 118* 05/23/2013 1340   BUN 25* 05/25/2013 0518   BUN 17 05/24/2013 0507   BUN 18 05/23/2013 1340   CREATININE 1.28 05/25/2013 0518   CREATININE 1.09 05/24/2013 0507   CREATININE 1.17 05/23/2013 1340   CREATININE 1.68* 10/30/2012 1112   CREATININE 0.95 02/27/2011 0850   CREATININE 1.09 01/27/2011 1055   CALCIUM 9.3 05/25/2013 0518   CALCIUM 9.5 05/24/2013 0507   CALCIUM 9.7 05/23/2013 1340   GFRNONAA 54* 05/25/2013 0518   GFRNONAA 65* 05/24/2013 0507   GFRNONAA 60* 05/23/2013 1340   GFRAA 62* 05/25/2013 0518   GFRAA 76* 05/24/2013 0507   GFRAA 70* 05/23/2013 1340   CMP     Component Value Date/Time   NA 143 05/25/2013 0518   K 3.9 05/25/2013 0518   CL 100 05/25/2013 0518   CO2 32 05/25/2013 0518   GLUCOSE 112* 05/25/2013 0518   BUN 25* 05/25/2013 0518   CREATININE 1.28 05/25/2013 0518   CREATININE 1.68* 10/30/2012 1112   CALCIUM 9.3 05/25/2013 0518   PROT 6.8 05/25/2013 0518   ALBUMIN 3.7 05/25/2013 0518   AST 17 05/25/2013 0518   ALT 11 05/25/2013 0518   ALKPHOS 81 05/25/2013 0518   BILITOT 0.5 05/25/2013 0518   GFRNONAA 54* 05/25/2013 0518   GFRAA 62* 05/25/2013 0518       Component Value Date/Time   WBC 5.3 05/23/2013 1340   WBC 6.1 04/21/2013 1032   WBC 6.4 01/06/2013 1236   HGB 11.0* 05/23/2013 1340   HGB 11.6* 04/21/2013 1032   HGB 12.9* 01/06/2013 1236   HCT 33.4* 05/23/2013 1340   HCT 35.2* 04/21/2013 1032   HCT 39.2 01/06/2013 1236   MCV 91.3 05/23/2013 1340   MCV 90.5 04/21/2013 1032   MCV 88.1 01/06/2013 1236    Lipid Panel     Component Value Date/Time   CHOL 152 07/15/2011 0856   TRIG 102 07/15/2011 0856   HDL 41 07/15/2011 0856   CHOLHDL 3.7 07/15/2011 0856   VLDL 20 07/15/2011 0856   LDLCALC 91 07/15/2011 0856    ABG    Component Value Date/Time   PHART 7.523* 08/09/2012 0545   PCO2ART 35.3 08/09/2012 0545   PO2ART 58.4* 08/09/2012 0545   HCO3 28.9* 08/09/2012 0545   TCO2 26.4 08/09/2012 0545   ACIDBASEDEF 0.6 08/08/2012  0527   O2SAT 91.9 08/09/2012 0545     Lab Results   Component Value Date   TSH 0.831 10/26/2012   BNP (last 3 results)  Recent Labs  08/13/12 0455 10/26/12 1126 05/23/13 1358  PROBNP 2442.0* 11283.0* 8068.0*   Cardiac Panel (last 3 results) No results found for this basename: CKTOTAL, CKMB, TROPONINI, RELINDX,  in the last 72 hours  Iron/TIBC/Ferritin    Component Value Date/Time   IRON 68 10/06/2012 1430   TIBC 325 10/06/2012 1430   FERRITIN 77 10/06/2012 1430     EKG Orders placed during the hospital encounter of 05/23/13  . ED EKG  . ED EKG  . EKG 12-LEAD  . EKG 12-LEAD  . EKG     Prior Assessment and Plan Problem List as of 06/16/2013     Cardiovascular and Mediastinum   Essential hypertension, benign   Last Assessment & Plan   06/08/2013 Office Visit Written 06/08/2013  3:14 PM by Lendon Colonel, NP     Blood pressure is low normal for EF of 35%. No other changes in his medications at this time.    Atrial fibrillation   Last Assessment & Plan   06/08/2013 Office Visit Written 06/08/2013  3:13 PM by Lendon Colonel, NP     Heart rate is well controlled currently. I have discussed this patient with Dr. Domenic Polite on site. We are concerned about decreased EF to 35%, with use of diltazem for rate control. Will wean down the diltiazem to 180 mg and begin low dose metoprolol 25 mg BID. He is to call us for episodes of wheezing or worsening dyspnea with medication change.  He will continue on coumadin therapy for now.     Chronic diastolic heart failure   Last Assessment & Plan   12/21/2012 Office Visit Written 12/21/2012 11:36 AM by Satira Sark, MD     Prone to exacerbations as noted previously. His weight is up, although clinically he feels well, did develop progressive renal insufficiency on Lasix 60 mg daily. We may have to eventually change him to 40 mg alternating with 60 mg every other day.    Nonischemic cardiomyopathy   Last Assessment & Plan   06/08/2013 Office Visit Written 06/08/2013  3:16 PM by Lendon Colonel, NP     He has had a significant change in his EF from May of 2014 from 55% to 35%. After discussion with patient, he is experiencing some heart burn symptoms. I will plan a Lexiscan Cardiolite for evaluation of ischemia, with changes in EF. He is not ready to be cleared to have L TKR until we have fully evaluated him, despite spinal block anesthesia.   He will see Dr. Domenic Polite in April on previously planned appt for further recommendations and discussion of test results.     Mitral regurgitation   Last Assessment & Plan   12/21/2012 Office Visit Written 12/21/2012 11:37 AM by Satira Sark, MD     Moderate to severe, being managed conservatively at this time. Also contributes to exacerbations of diastolic heart failure.      Nervous and Auditory   INSOMNIA, CHRONIC   Last Assessment & Plan   09/20/2012 Office Visit Written 09/20/2012  2:55 PM by Lendon Colonel, NP     Consider sleep study for evaluation for OSA.      Musculoskeletal and Integument   Calcium pyrophosphate crystal disease   OA (osteoarthritis) of knee     Other  TOBACCO ABUSE   Last Assessment & Plan   04/19/2012 Office Visit Edited 04/23/2012  8:01 AM by Yehuda Savannah, MD     Patient congratulated on decreasing tobacco consumption, but advised of the importance of quitting entirely.  He reports that he is in the process of doing so.    GASTRIC ULCER, HX OF   Chronic anticoagulation   Last Assessment & Plan   04/19/2012 Office Visit Written 04/19/2012 12:39 PM by Yehuda Savannah, MD     Patient is anemic, but has multiple potential causes other than blood loss. Dr. Tressie Stalker is monitoring and treating.    Follicular lymphoma   Last Assessment & Plan   04/19/2012 Office Visit Written 04/19/2012 12:39 PM by Yehuda Savannah, MD     Case discussed with Dr. Tressie Stalker. Patient is doing well with no evidence for active disease and a negative PET scan in the recent past    Hyperlipidemia   Last  Assessment & Plan   04/19/2012 Office Visit Edited 04/19/2012 12:43 PM by Yehuda Savannah, MD     Adequate control of hyperlipidemia when last assessed 8 months ago; although current dose of lovastatin is low, pharmacologic response is adequate, and the patient has had myalgias with higher dose statins in the past.  Current therapy will be continued..    Back pain   Encounter for therapeutic drug monitoring       Imaging: Dg Chest Portable 1 View  05/23/2013   CLINICAL DATA:  Shortness of breath.  EXAM: PORTABLE CHEST - 1 VIEW  COMPARISON:  03/22/2013  FINDINGS: Single view of the chest demonstrates increased interstitial lung markings bilaterally. There is a focal density in the right lower chest which probably represents fissural pleural fluid. Right basilar densities are suggestive atelectasis and pleural fluid. Patient has a right shoulder replacement. Heart size is enlarged.  IMPRESSION: Cardiomegaly with increased interstitial lung markings and right pleural fluid. Findings are most compatible with CHF.   Electronically Signed   By: Markus Daft M.D.   On: 05/23/2013 13:54   Dg Abd 2 Views  05/23/2013   CLINICAL DATA:  Constipation for 3 days.  EXAM: ABDOMEN - 2 VIEW  COMPARISON:  Abdominal pelvic CT 01/03/2013.  FINDINGS: There is mildly prominent stool throughout the colon which is mildly distended. No bowel wall thickening is identified. There is no evidence of small bowel distention or free intraperitoneal air. Diffuse lumbar spondylosis and scattered vascular calcifications are noted.  IMPRESSION: Mild colonic distention and increased stool burden consistent with mild constipation.   Electronically Signed   By: Camie Patience M.D.   On: 05/23/2013 14:50

## 2013-06-17 ENCOUNTER — Telehealth: Payer: Self-pay | Admitting: Cardiology

## 2013-06-17 MED ORDER — METOPROLOL TARTRATE 25 MG PO TABS
25.0000 mg | ORAL_TABLET | Freq: Two times a day (BID) | ORAL | Status: DC
Start: 2013-06-17 — End: 2013-07-06

## 2013-06-17 NOTE — Telephone Encounter (Signed)
Sent metoprolol to [facility]

## 2013-06-20 ENCOUNTER — Telehealth: Payer: Self-pay | Admitting: *Deleted

## 2013-06-20 MED ORDER — WARFARIN SODIUM 5 MG PO TABS: ORAL_TABLET | ORAL | Status: DC

## 2013-06-20 NOTE — Telephone Encounter (Signed)
Needs new RX for Warfarin sent to [facility] for new directions / tgs

## 2013-06-23 ENCOUNTER — Telehealth: Payer: Self-pay | Admitting: Adult Health

## 2013-06-23 DIAGNOSIS — I10 Essential (primary) hypertension: Secondary | ICD-10-CM

## 2013-06-23 NOTE — Telephone Encounter (Signed)
Have him take extra lasix 20 mg daily for 2 more days. Continue to weigh daily. Repeat BMET on Monday. Some edema is expected on diltiazem as side effect. Keep legs elevated when in seated position. Avoid foods with salt already in it (chips, soups, sausage etc). Keep track of BP. If elevated or significantly hypotensive call us. Make sure he sees Dr. Domenic Polite on follow-up soon.

## 2013-06-23 NOTE — Telephone Encounter (Signed)
Pt wife wanted to let us know that pt has had swelling in legs and ankles. Pt was given a one more half pill yesterday of Lasik and was given a whole one this morning. Pt is letting us know. If there are any questions do call back

## 2013-06-23 NOTE — Telephone Encounter (Signed)
Wife called to state pts wt yesterday was 193 # (3# over since ov 06/13/13) She gave him an extra lasix 20 mg yesterday and today. His weight today is down to 190 lbs.She feels his legs are more swollen and tight than what she noted on Monday.She also reports he stated he couldn't walk far today as he is more SOB. I told her I would call her back today with any further instructions

## 2013-06-23 NOTE — Telephone Encounter (Signed)
Spoke with wife,we make changes as directed by K.Lawrence's note below,order placed for bmet

## 2013-06-27 ENCOUNTER — Ambulatory Visit (INDEPENDENT_AMBULATORY_CARE_PROVIDER_SITE_OTHER): Payer: Medicare Other | Admitting: *Deleted

## 2013-06-27 ENCOUNTER — Encounter: Payer: Medicare Other | Admitting: Cardiology

## 2013-06-27 DIAGNOSIS — Z7901 Long term (current) use of anticoagulants: Secondary | ICD-10-CM

## 2013-06-27 DIAGNOSIS — Z5181 Encounter for therapeutic drug level monitoring: Secondary | ICD-10-CM

## 2013-06-27 DIAGNOSIS — I4891 Unspecified atrial fibrillation: Secondary | ICD-10-CM

## 2013-06-27 LAB — POCT INR: INR: 2.8

## 2013-07-06 ENCOUNTER — Encounter (HOSPITAL_COMMUNITY): Payer: Self-pay | Admitting: Emergency Medicine

## 2013-07-06 ENCOUNTER — Emergency Department (HOSPITAL_COMMUNITY): Payer: Medicare Other

## 2013-07-06 ENCOUNTER — Inpatient Hospital Stay (HOSPITAL_COMMUNITY)
Admission: EM | Admit: 2013-07-06 | Discharge: 2013-07-08 | DRG: 292 | Disposition: A | Discharge status: Home / Self Care | Payer: Medicare Other | Attending: Internal Medicine | Admitting: Internal Medicine

## 2013-07-06 DIAGNOSIS — Z8711 Personal history of peptic ulcer disease: Secondary | ICD-10-CM

## 2013-07-06 DIAGNOSIS — R0600 Dyspnea, unspecified: Secondary | ICD-10-CM

## 2013-07-06 DIAGNOSIS — I4891 Unspecified atrial fibrillation: Secondary | ICD-10-CM

## 2013-07-06 DIAGNOSIS — I5033 Acute on chronic diastolic (congestive) heart failure: Secondary | ICD-10-CM

## 2013-07-06 DIAGNOSIS — I2510 Atherosclerotic heart disease of native coronary artery without angina pectoris: Secondary | ICD-10-CM | POA: Diagnosis present

## 2013-07-06 DIAGNOSIS — M1990 Unspecified osteoarthritis, unspecified site: Secondary | ICD-10-CM | POA: Diagnosis present

## 2013-07-06 DIAGNOSIS — M1120 Other chondrocalcinosis, unspecified site: Secondary | ICD-10-CM

## 2013-07-06 DIAGNOSIS — I5043 Acute on chronic combined systolic (congestive) and diastolic (congestive) heart failure: Principal | ICD-10-CM | POA: Diagnosis present

## 2013-07-06 DIAGNOSIS — G4700 Insomnia, unspecified: Secondary | ICD-10-CM

## 2013-07-06 DIAGNOSIS — F411 Generalized anxiety disorder: Secondary | ICD-10-CM | POA: Diagnosis present

## 2013-07-06 DIAGNOSIS — I509 Heart failure, unspecified: Secondary | ICD-10-CM

## 2013-07-06 DIAGNOSIS — J449 Chronic obstructive pulmonary disease, unspecified: Secondary | ICD-10-CM | POA: Diagnosis present

## 2013-07-06 DIAGNOSIS — Z7901 Long term (current) use of anticoagulants: Secondary | ICD-10-CM

## 2013-07-06 DIAGNOSIS — C8290 Follicular lymphoma, unspecified, unspecified site: Secondary | ICD-10-CM

## 2013-07-06 DIAGNOSIS — M179 Osteoarthritis of knee, unspecified: Secondary | ICD-10-CM

## 2013-07-06 DIAGNOSIS — R609 Edema, unspecified: Secondary | ICD-10-CM

## 2013-07-06 DIAGNOSIS — I340 Nonrheumatic mitral (valve) insufficiency: Secondary | ICD-10-CM

## 2013-07-06 DIAGNOSIS — I428 Other cardiomyopathies: Secondary | ICD-10-CM

## 2013-07-06 DIAGNOSIS — J90 Pleural effusion, not elsewhere classified: Secondary | ICD-10-CM

## 2013-07-06 DIAGNOSIS — F17200 Nicotine dependence, unspecified, uncomplicated: Secondary | ICD-10-CM

## 2013-07-06 DIAGNOSIS — Z87891 Personal history of nicotine dependence: Secondary | ICD-10-CM

## 2013-07-06 DIAGNOSIS — J4489 Other specified chronic obstructive pulmonary disease: Secondary | ICD-10-CM | POA: Diagnosis present

## 2013-07-06 DIAGNOSIS — I10 Essential (primary) hypertension: Secondary | ICD-10-CM

## 2013-07-06 DIAGNOSIS — Z8249 Family history of ischemic heart disease and other diseases of the circulatory system: Secondary | ICD-10-CM

## 2013-07-06 DIAGNOSIS — Z5181 Encounter for therapeutic drug level monitoring: Secondary | ICD-10-CM

## 2013-07-06 DIAGNOSIS — M1710 Unilateral primary osteoarthritis, unspecified knee: Secondary | ICD-10-CM

## 2013-07-06 DIAGNOSIS — G8929 Other chronic pain: Secondary | ICD-10-CM | POA: Diagnosis present

## 2013-07-06 DIAGNOSIS — E785 Hyperlipidemia, unspecified: Secondary | ICD-10-CM

## 2013-07-06 DIAGNOSIS — Z85820 Personal history of malignant melanoma of skin: Secondary | ICD-10-CM

## 2013-07-06 DIAGNOSIS — M549 Dorsalgia, unspecified: Secondary | ICD-10-CM

## 2013-07-06 DIAGNOSIS — Z9221 Personal history of antineoplastic chemotherapy: Secondary | ICD-10-CM

## 2013-07-06 DIAGNOSIS — I5032 Chronic diastolic (congestive) heart failure: Secondary | ICD-10-CM

## 2013-07-06 DIAGNOSIS — C8299 Follicular lymphoma, unspecified, extranodal and solid organ sites: Secondary | ICD-10-CM | POA: Diagnosis present

## 2013-07-06 LAB — HEPATIC FUNCTION PANEL
ALT: 10 U/L (ref 0–53)
AST: 20 U/L (ref 0–37)
Albumin: 3.9 g/dL (ref 3.5–5.2)
Alkaline Phosphatase: 90 U/L (ref 39–117)
BILIRUBIN INDIRECT: 0.6 mg/dL (ref 0.3–0.9)
Bilirubin, Direct: 0.3 mg/dL (ref 0.0–0.3)
Total Bilirubin: 0.9 mg/dL (ref 0.3–1.2)
Total Protein: 7.7 g/dL (ref 6.0–8.3)

## 2013-07-06 LAB — URINALYSIS, ROUTINE W REFLEX MICROSCOPIC
Bilirubin Urine: NEGATIVE
GLUCOSE, UA: NEGATIVE mg/dL
HGB URINE DIPSTICK: NEGATIVE
Ketones, ur: NEGATIVE mg/dL
Leukocytes, UA: NEGATIVE
Nitrite: NEGATIVE
Protein, ur: NEGATIVE mg/dL
Specific Gravity, Urine: 1.01 (ref 1.005–1.030)
Urobilinogen, UA: 0.2 mg/dL (ref 0.0–1.0)
pH: 5 (ref 5.0–8.0)

## 2013-07-06 LAB — CBC
HEMATOCRIT: 33 % — AB (ref 39.0–52.0)
HEMOGLOBIN: 10.7 g/dL — AB (ref 13.0–17.0)
MCH: 28.3 pg (ref 26.0–34.0)
MCHC: 32.4 g/dL (ref 30.0–36.0)
MCV: 87.3 fL (ref 78.0–100.0)
Platelets: 237 10*3/uL (ref 150–400)
RBC: 3.78 MIL/uL — AB (ref 4.22–5.81)
RDW: 16.3 % — ABNORMAL HIGH (ref 11.5–15.5)
WBC: 6 10*3/uL (ref 4.0–10.5)

## 2013-07-06 LAB — BASIC METABOLIC PANEL
BUN: 21 mg/dL (ref 6–23)
CALCIUM: 9.5 mg/dL (ref 8.4–10.5)
CO2: 27 meq/L (ref 19–32)
Chloride: 102 mEq/L (ref 96–112)
Creatinine, Ser: 1.15 mg/dL (ref 0.50–1.35)
GFR calc Af Amer: 71 mL/min — ABNORMAL LOW (ref >90)
GFR calc non Af Amer: 61 mL/min — ABNORMAL LOW (ref >90)
GLUCOSE: 98 mg/dL (ref 70–99)
Potassium: 4 mEq/L (ref 3.7–5.3)
Sodium: 141 mEq/L (ref 137–147)

## 2013-07-06 LAB — DIGOXIN LEVEL: Digoxin Level: 1.4 ng/mL (ref 0.8–2.0)

## 2013-07-06 LAB — MRSA PCR SCREENING: MRSA by PCR: NEGATIVE

## 2013-07-06 LAB — PROTIME-INR
INR: 3.56 — ABNORMAL HIGH (ref 0.00–1.49)
PROTHROMBIN TIME: 34.3 s — AB (ref 11.6–15.2)

## 2013-07-06 LAB — LACTIC ACID, PLASMA: LACTIC ACID, VENOUS: 1.7 mmol/L (ref 0.5–2.2)

## 2013-07-06 LAB — LIPASE, BLOOD: LIPASE: 12 U/L (ref 11–59)

## 2013-07-06 LAB — TROPONIN I

## 2013-07-06 LAB — PRO B NATRIURETIC PEPTIDE: Pro B Natriuretic peptide (BNP): 9465 pg/mL — ABNORMAL HIGH (ref 0–125)

## 2013-07-06 MED ORDER — FUROSEMIDE 10 MG/ML IJ SOLN
40.0000 mg | Freq: Once | INTRAMUSCULAR | Status: AC
  Administered 2013-07-06: 40 mg via INTRAVENOUS
  Filled 2013-07-06: qty 4

## 2013-07-06 MED ORDER — HYDROCODONE-ACETAMINOPHEN 10-325 MG PO TABS
1.0000 | ORAL_TABLET | Freq: Four times a day (QID) | ORAL | Status: DC | PRN
Start: 2013-07-06 — End: 2013-07-07
  Administered 2013-07-06 – 2013-07-07 (×2): 1 via ORAL
  Filled 2013-07-06: qty 1

## 2013-07-06 MED ORDER — POTASSIUM CHLORIDE CRYS ER 10 MEQ PO TBCR
10.0000 meq | EXTENDED_RELEASE_TABLET | Freq: Every day | ORAL | Status: DC
  Administered 2013-07-06: 10 meq via ORAL
  Filled 2013-07-06 (×4): qty 1

## 2013-07-06 MED ORDER — DILTIAZEM HCL ER COATED BEADS 180 MG PO CP24
180.0000 mg | ORAL_CAPSULE | Freq: Every day | ORAL | Status: DC
  Administered 2013-07-07 – 2013-07-08 (×2): 180 mg via ORAL
  Filled 2013-07-06 (×2): qty 1

## 2013-07-06 MED ORDER — VITAMIN D 1000 UNITS PO TABS
1000.0000 [IU] | ORAL_TABLET | Freq: Every morning | ORAL | Status: DC
  Administered 2013-07-07 – 2013-07-08 (×2): 1000 [IU] via ORAL
  Filled 2013-07-06 (×2): qty 1

## 2013-07-06 MED ORDER — IOHEXOL 300 MG/ML  SOLN
100.0000 mL | Freq: Once | INTRAMUSCULAR | Status: AC | PRN
  Administered 2013-07-06: 100 mL via INTRAVENOUS

## 2013-07-06 MED ORDER — SERTRALINE HCL 50 MG PO TABS
50.0000 mg | ORAL_TABLET | Freq: Every day | ORAL | Status: DC
  Administered 2013-07-06 – 2013-07-07 (×2): 50 mg via ORAL
  Filled 2013-07-06 (×2): qty 1

## 2013-07-06 MED ORDER — SODIUM CHLORIDE 0.9 % IJ SOLN: 3.0000 mL | INTRAMUSCULAR | Status: DC | PRN

## 2013-07-06 MED ORDER — SODIUM CHLORIDE 0.9 % IJ SOLN
3.0000 mL | Freq: Two times a day (BID) | INTRAMUSCULAR | Status: DC
  Administered 2013-07-06 – 2013-07-07 (×3): 3 mL via INTRAVENOUS

## 2013-07-06 MED ORDER — ALPRAZOLAM 0.5 MG PO TABS
0.5000 mg | ORAL_TABLET | Freq: Three times a day (TID) | ORAL | Status: DC
  Administered 2013-07-07 – 2013-07-08 (×5): 0.5 mg via ORAL
  Filled 2013-07-06 (×5): qty 1

## 2013-07-06 MED ORDER — ALBUTEROL SULFATE (2.5 MG/3ML) 0.083% IN NEBU: 2.5000 mg | INHALATION_SOLUTION | Freq: Four times a day (QID) | RESPIRATORY_TRACT | Status: DC | PRN

## 2013-07-06 MED ORDER — SODIUM CHLORIDE 0.9 % IV SOLN: 250.0000 mL | INTRAVENOUS | Status: DC | PRN

## 2013-07-06 MED ORDER — CALCIUM CITRATE 950 (200 CA) MG PO TABS
1.0000 | ORAL_TABLET | Freq: Two times a day (BID) | ORAL | Status: DC
  Administered 2013-07-07 – 2013-07-08 (×3): 200 mg via ORAL
  Filled 2013-07-06 (×10): qty 1

## 2013-07-06 MED ORDER — FUROSEMIDE 40 MG PO TABS
40.0000 mg | ORAL_TABLET | Freq: Two times a day (BID) | ORAL | Status: DC
  Administered 2013-07-07: 40 mg via ORAL
  Filled 2013-07-06: qty 1

## 2013-07-06 MED ORDER — ASPIRIN EC 81 MG PO TBEC
81.0000 mg | DELAYED_RELEASE_TABLET | Freq: Every day | ORAL | Status: DC
  Administered 2013-07-06 – 2013-07-08 (×3): 81 mg via ORAL
  Filled 2013-07-06 (×3): qty 1

## 2013-07-06 MED ORDER — IOHEXOL 300 MG/ML  SOLN
50.0000 mL | Freq: Once | INTRAMUSCULAR | Status: AC | PRN
  Administered 2013-07-06: 50 mL via ORAL

## 2013-07-06 MED ORDER — DIGOXIN 125 MCG PO TABS
0.1250 mg | ORAL_TABLET | Freq: Every morning | ORAL | Status: DC
  Administered 2013-07-07 – 2013-07-08 (×2): 0.125 mg via ORAL
  Filled 2013-07-06 (×2): qty 1

## 2013-07-06 NOTE — ED Notes (Signed)
Pt has increasing SOB, diarrhea, and weakness for past three days. The SOB has been ongoing. Pt's family member states the pt has been very weak for past few days.

## 2013-07-06 NOTE — ED Provider Notes (Signed)
CSN: [id number]     Arrival date & time 07/06/13  1301 History  This chart was scribed for Charles Furry, MD by Ludger Nutting, ED Scribe. This patient was seen in room APA01/AP[facility]01 and the patient's care was started 3:42 PM.    Chief Complaint  Patient presents with  . Shortness of Breath      The history is provided by the patient and the spouse. No language interpreter was used.    HPI Comments: Charles [name] is a 73 y.o. male who presents to the Emergency Department complaining of 1 week of constant SOB that worsened last night. . He states the SOB is worse with laying down and had to sleep sitting up for the past 1 week. He was hospitalized in the beginning of March for COPD and CHF. He states he was improving after being discharged but gradually became SOB again.   He also has left sided abdominal pain for the past 1 month. He has been taking OTC medication for gas with moderate relief. He takes a daily stool softener and drinks Miralax every other day. He has been having more frequent BM's but denies diarrhea or hematochezia. 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 Lymphoma was treated with chemotherapy, nonsurgical. Remission x2 years. He has not had fevers night sweats weight loss or other B. symptoms.  Past Medical History  Diagnosis Date  . Essential hypertension, benign   . Atrial fibrillation 10/2008  . 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 Peptic ulcer disease   . Degenerative joint disease     Knees and shoulders  . Pneumonia 2010  . Allergic rhinitis   . Anxiety   . Drug abuse   . Tobacco abuse     50 pack years  . Mitral regurgitation     Moderate to severe May 2014  . COPD (chronic obstructive pulmonary disease)   . Follicular lymphoma 06/25/3152  . Basal cell carcinoma    Past Surgical History  Procedure Laterality Date  . Neck lesion biopsy  June 23, 84     Follicular lymphoma  . Skin cancer excision      under left breast; variously described as melanoma and basal cell  . Rotator cuff repair      right  . Patella fracture surgery Left 1975  . Hematoma evacuation      after melanoma removal  . Portacath placement  07/12/2010  . Colonoscopy  2009    Also underwent an EGD; patient reports no significant findings  . Bone marrow aspiration  06/2012  . Bone marrow biopsy  06/2012  . Fungal infection    . Port-a-cath removal Left 01/17/2013    Procedure: REMOVAL PORT-A-CATH;  Surgeon: Scherry Ran, MD;  Location: [facility];  Service: General;  Laterality: Left;   Family History  Problem Relation Age of Onset  . Heart attack Father   . Coronary artery disease Other    History  Substance Use Topics  . Smoking status: Former Smoker -- 1.00 packs/day for 50 years    Types: Cigarettes    Quit date: 04/12/2012  . Smokeless tobacco: Never Used  . Alcohol Use: No    Review of Systems  Constitutional: Negative for fever, chills, diaphoresis, appetite change and fatigue.  HENT: Negative for mouth sores, sore throat and trouble swallowing.   Eyes: Negative for visual disturbance.  Respiratory: Positive for shortness of  breath. Negative for cough, chest tightness and wheezing.   Cardiovascular: Positive for leg swelling. Negative for chest pain.  Gastrointestinal: Positive for abdominal pain. Negative for nausea, vomiting, diarrhea, blood in stool and abdominal distention.  Endocrine: Negative for polydipsia, polyphagia and polyuria.  Genitourinary: Negative for dysuria, frequency and hematuria.  Musculoskeletal: Negative for gait problem.  Skin: Negative for color change, pallor and rash.  Neurological: Negative for dizziness, syncope, light-headedness and headaches.  Hematological: Does not bruise/bleed easily.  Psychiatric/Behavioral: Negative for behavioral problems and confusion.      Allergies  Metoprolol and Statins  Home  Medications   Prior to Admission medications   Medication Sig Start Date End Date Taking? Authorizing Provider  ALPRAZolam Duanne Moron) 1 MG tablet Take 0.5 mg by mouth 3 (three) times daily.     Historical Provider, MD  calcium citrate (CALCITRATE - DOSED IN MG ELEMENTAL CALCIUM) 950 MG tablet Take 1 tablet by mouth 2 (two) times daily.    Historical Provider, MD  cholecalciferol (VITAMIN D) 1000 UNITS tablet Take 1,000 Units by mouth every morning.    Historical Provider, MD  digoxin (LANOXIN) 0.125 MG tablet Take 0.125 mg by mouth every morning.    Historical Provider, MD  diltiazem (CARDIZEM CD) 180 MG 24 hr capsule Take 1 capsule (180 mg total) by mouth daily. 06/08/13   Lendon Colonel, NP  furosemide (LASIX) 20 MG tablet Take 1 tablet (20 mg total) by mouth 2 (two) times daily. *Patient may take an additional tablet if needed* 05/25/13   Nimish Luther Parody, MD  HYDROcodone-acetaminophen (NORCO) 10-325 MG per tablet Take 1 tablet by mouth every 6 (six) hours as needed for moderate pain.    Historical Provider, MD  lovastatin (MEVACOR) 10 MG tablet Take 10 mg by mouth at bedtime.     Historical Provider, MD  metoprolol tartrate (LOPRESSOR) 25 MG tablet Take 1 tablet (25 mg total) by mouth 2 (two) times daily. 06/17/13   Lendon Colonel, NP  omeprazole (PRILOSEC) 20 MG capsule Take 20 mg by mouth every morning.     Historical Provider, MD  potassium chloride (K-DUR) 10 MEQ tablet Take 10 mEq by mouth daily.     Historical Provider, MD  PROAIR HFA 108 (90 BASE) MCG/ACT inhaler Inhale 1 puff into the lungs every 6 (six) hours as needed. For shortness of breath 01/15/12   Historical Provider, MD  sertraline (ZOLOFT) 50 MG tablet Take 50 mg by mouth at bedtime. 08/13/12   Angus Ailene Ravel, MD  tiotropium (SPIRIVA) 18 MCG inhalation capsule Place 18 mcg into inhaler and inhale every other day.     Historical Provider, MD  traMADol (ULTRAM) 50 MG tablet Take 50 mg by mouth 4 (four) times daily as needed for  moderate pain or severe pain.     Historical Provider, MD  warfarin (COUMADIN) 5 MG tablet Take 1/2 tablet daily except 1 tablet on Tuesdays, Thursdays and Saturdays 06/20/13   Lendon Colonel, NP   BP 130/80  Pulse 85  Temp(Src) 97.5 F (36.4 C) (Oral)  Resp 17  Ht $R'5\' 7"'Mg$  (1.702 m)  Wt 187 lb (84.823 kg)  BMI 29.28 kg/m2  SpO2 86% Physical Exam  Nursing note and vitals reviewed. Constitutional: He is oriented to person, place, and time. He appears well-developed and well-nourished. No distress.  HENT:  Head: Macrocephalic.  Mouth/Throat: Mucous membranes are dry.  Eyes: Pupils are equal, round, and reactive to light. No scleral icterus.  Pale conjunctival bilaterally.  Neck: Normal range of motion. Neck supple. No thyromegaly present.  Cardiovascular: Normal rate and normal heart sounds.  An irregularly irregular rhythm present. Exam reveals no gallop and no friction rub.   No murmur heard. Pulmonary/Chest: Effort normal. No respiratory distress. He has decreased breath sounds. He has no wheezes. He has rales.  Diminished breath sounds at the right base. Crackles at the scapular tip.   Abdominal: Soft. Bowel sounds are normal. He exhibits no distension. There is no splenomegaly. There is no tenderness. There is no rebound.  No peritoneal signs.   Musculoskeletal: Normal range of motion. He exhibits edema (1+ left lower extremity, 2+ right lower extremity ).  Lymphadenopathy:    He has cervical adenopathy (right neck ).  Neurological: He is alert and oriented to person, place, and time.  Skin: Skin is warm and dry. No rash noted.  Psychiatric: He has a normal mood and affect. His behavior is normal.    ED Course  Procedures (including critical care time)  DIAGNOSTIC STUDIES: Oxygen Saturation is 97% on RA, adequate by my interpretation.    COORDINATION OF CARE: 3:49 PM Discussed treatment plan with pt at bedside and pt agreed to plan.   Labs Review Labs Reviewed  CBC -  Abnormal; Notable for the following:    RBC 3.78 (*)    Hemoglobin 10.7 (*)    HCT 33.0 (*)    RDW 16.3 (*)    All other components within normal limits  BASIC METABOLIC PANEL - Abnormal; Notable for the following:    GFR calc non Af Amer 61 (*)    GFR calc Af Amer 71 (*)    All other components within normal limits  PROTIME-INR - Abnormal; Notable for the following:    Prothrombin Time 34.3 (*)    INR 3.56 (*)    All other components within normal limits  PRO B NATRIURETIC PEPTIDE - Abnormal; Notable for the following:    Pro B Natriuretic peptide (BNP) 9465.0 (*)    All other components within normal limits  CULTURE, BLOOD (ROUTINE X 2)  CULTURE, BLOOD (ROUTINE X 2)  DIGOXIN LEVEL  LIPASE, BLOOD  HEPATIC FUNCTION PANEL  LACTIC ACID, PLASMA  URINALYSIS, ROUTINE W REFLEX MICROSCOPIC  TROPONIN I    Imaging Review Dg Chest 2 View  07/06/2013   CLINICAL DATA:  Shortness of breath.  EXAM: CHEST - 2 VIEW  COMPARISON:  DG CHEST 1V PORT dated 05/23/2013; DG CHEST 2 VIEW dated 03/22/2013; CT ABDOMEN W/CM dated 01/03/2013  FINDINGS: There is interval enlargement of a right pleural effusion which may be partially loculated anterolaterally based on x-ray. There is associated atelectasis at the right lung base and potentially subtle infiltrate present. Stable chronic lung disease and cardiomegaly. Stable moderate-sized hiatal hernia. No pneumothorax or pulmonary edema identified. The visualized skeletal structures are unremarkable.  IMPRESSION: Enlargement of right pleural effusion which is now moderate in volume and may be partially loculated anterolaterally based on chest x-ray. There may be associated infiltrate in the right lower lung as well.   Electronically Signed   By: Aletta Edouard M.D.   On: 07/06/2013 15:34   Ct Chest W Contrast  07/06/2013   CLINICAL DATA:  Shortness of breath, weakness and diarrhea.  EXAM: CT CHEST, ABDOMEN, AND PELVIS WITH CONTRAST  TECHNIQUE: Multidetector CT  imaging of the chest, abdomen and pelvis was performed following the standard protocol during bolus administration of intravenous contrast.  CONTRAST:  10mL OMNIPAQUE IOHEXOL 300 MG/ML SOLN,  OMNIPAQUE IOHEXOL 300 MG/ML SOLN  COMPARISON:  CT scan 01/03/2013 and PET-CT 06/21/2012.  FINDINGS: CT CHEST FINDINGS  The chest wall is unremarkable. No supraclavicular or axillary mass or adenopathy. Small scattered lymph nodes are noted. A right humeral head prosthesis is noted with moderate artifact. The bony thorax is otherwise intact. No destructive bone lesions or spinal canal compromise.  The heart is enlarged. No pericardial effusion. There are borderline enlarged mediastinal and hilar lymph nodes but these appear relatively stable. The aorta is normal in caliber. No dissection. The esophagus is grossly normal.  There are bilateral pleural effusions, right greater than left with fluid in the right major fissure. Patchy ground-glass opacities are noted in lungs along with small nodules. No overt pulmonary edema. Findings could be due to patchy inflammation/ infection/alveolitis. Lymphoma missed involvement is possible.  CT ABDOMEN AND PELVIS FINDINGS  The liver is unremarkable. No focal hepatic lesions. No biliary dilatation. The gallbladder is normal. No common bile duct dilatation. The pancreas is normal. The spleen is normal in size. No focal lesions. The adrenal glands and kidneys are unremarkable.  The stomach, duodenum, small bowel and colon are unremarkable. No inflammatory changes, mass lesions or obstructive findings. No mesenteric or retroperitoneal mass or adenopathy. The aorta and branch vessels are patent. The major venous structures are patent.  The bladder, prostate gland and seminal vesicles are unremarkable. No pelvic mass, adenopathy or free pelvic fluid collections. No inguinal mass or adenopathy.  The bony structures are unremarkable.  IMPRESSION: Stable mediastinal and hilar lymph nodes. No  findings for lymphoma in the abdomen or pelvis.  Bilateral pleural effusions, right greater than left with overlying atelectasis. There are also patchy ground-glass opacities and small pulmonary nodules. This could be due to an inflammatory or atypical infectious process/alveolitis. Lymphomatous involvement is unlikely.  Stable cardiac enlargement.  No acute abdominal/pelvic findings, mass lesions or adenopathy.   Electronically Signed   By: Loralie Champagne M.D.   On: 07/06/2013 17:55   Ct Abdomen Pelvis W Contrast  07/06/2013   CLINICAL DATA:  Shortness of breath, weakness and diarrhea.  EXAM: CT CHEST, ABDOMEN, AND PELVIS WITH CONTRAST  TECHNIQUE: Multidetector CT imaging of the chest, abdomen and pelvis was performed following the standard protocol during bolus administration of intravenous contrast.  CONTRAST:  79mL OMNIPAQUE IOHEXOL 300 MG/ML SOLN, OMNIPAQUE IOHEXOL 300 MG/ML SOLN  COMPARISON:  CT scan 01/03/2013 and PET-CT 06/21/2012.  FINDINGS: CT CHEST FINDINGS  The chest wall is unremarkable. No supraclavicular or axillary mass or adenopathy. Small scattered lymph nodes are noted. A right humeral head prosthesis is noted with moderate artifact. The bony thorax is otherwise intact. No destructive bone lesions or spinal canal compromise.  The heart is enlarged. No pericardial effusion. There are borderline enlarged mediastinal and hilar lymph nodes but these appear relatively stable. The aorta is normal in caliber. No dissection. The esophagus is grossly normal.  There are bilateral pleural effusions, right greater than left with fluid in the right major fissure. Patchy ground-glass opacities are noted in lungs along with small nodules. No overt pulmonary edema. Findings could be due to patchy inflammation/ infection/alveolitis. Lymphoma missed involvement is possible.  CT ABDOMEN AND PELVIS FINDINGS  The liver is unremarkable. No focal hepatic lesions. No biliary dilatation. The gallbladder is  normal. No common bile duct dilatation. The pancreas is normal. The spleen is normal in size. No focal lesions. The adrenal glands and kidneys are unremarkable.  The stomach, duodenum, small bowel and  colon are unremarkable. No inflammatory changes, mass lesions or obstructive findings. No mesenteric or retroperitoneal mass or adenopathy. The aorta and branch vessels are patent. The major venous structures are patent.  The bladder, prostate gland and seminal vesicles are unremarkable. No pelvic mass, adenopathy or free pelvic fluid collections. No inguinal mass or adenopathy.  The bony structures are unremarkable.  IMPRESSION: Stable mediastinal and hilar lymph nodes. No findings for lymphoma in the abdomen or pelvis.  Bilateral pleural effusions, right greater than left with overlying atelectasis. There are also patchy ground-glass opacities and small pulmonary nodules. This could be due to an inflammatory or atypical infectious process/alveolitis. Lymphomatous involvement is unlikely.  Stable cardiac enlargement.  No acute abdominal/pelvic findings, mass lesions or adenopathy.   Electronically Signed   By: Kalman Jewels M.D.   On: 07/06/2013 17:55     EKG Interpretation None      MDM   Final diagnoses:  CHF (congestive heart failure)  Pleural effusion  Dyspnea  Edema    CT shows no concerning findings in the abdomen. Effusion and possible infiltrate in the chest. No concerning adenopathy or signs of suggest recurrence of his lymphoma. BNP is elevated. Creatinine baseline. Symptoms most consistent with CHF and transudate effusions. Differential also to include para-pneumonic effusion, or malignant effusion.   Pt not on Home O2.  He is 87% on RA here, 93% on 2lNC.  IV is placed. Patient given IV Lasix 40 mg. I have placed a call to the hospitalist regarding admission.   I personally performed the services described in this documentation, which was scribed in my presence. The recorded  information has been reviewed and is accurate.   Charles Furry, MD 07/10/13 [phone]

## 2013-07-06 NOTE — H&P (Signed)
PCP:   [name], MD   Chief Complaint:  sob  HPI: 73 yo male h/o NICM EF 25%, chronic pain, lymphoma in remission comes in with several days of worsening sob, orthopnea, pnd and le swelling.  He has been taking his lasix 22m bid, has gained about 2 lbs in the last 3 or 4 days.  No fevers.  No coughing.  Does not require oxygen at home chronically.  No cp.  No abd pain.  No rashes.  He has received lasix 455miv once in the ED, has diuresed almost one liter and is already feeling better but does not like that hes "peeing like a hose".  Review of Systems:  Positive and negative as per HPI otherwise all other systems are negative  Past Medical History: Past Medical History  Diagnosis Date  . Essential hypertension, benign   . Atrial fibrillation 10/2008  . 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 Peptic ulcer disease   . Degenerative joint disease     Knees and shoulders  . Pneumonia 2010  . Allergic rhinitis   . Anxiety   . Drug abuse   . Tobacco abuse     50 pack years  . Mitral regurgitation     Moderate to severe May 2014  . COPD (chronic obstructive pulmonary disease)   . 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 Basal cell carcinoma    Past Surgical History  Procedure Laterality Date  . Neck lesion biopsy  April 2, 206384  Follicular lymphoma  . Skin cancer excision      under left breast; variously described as melanoma and basal cell  . Rotator cuff repair      right  . Patella fracture surgery Left 1975  . Hematoma evacuation      after melanoma removal  . Portacath placement  07/12/2010  . Colonoscopy  2009    Also underwent an EGD; patient reports no significant findings  . Bone marrow aspiration  06/2012  . Bone marrow biopsy  06/2012  . Fungal infection    . Port-a-cath removal Left 01/17/2013    Procedure: REMOVAL PORT-A-CATH;  Surgeon: WiScherry RanMD;  Location: [facility];  Service: General;   Laterality: Left;    Medications: Prior to Admission medications   Medication Sig Start Date End Date Taking? Authorizing Provider  ALPRAZolam (XDuanne Moron1 MG tablet Take 0.5 mg by mouth 3 (three) times daily.    Yes Historical Provider, MD  calcium citrate (CALCITRATE - DOSED IN MG ELEMENTAL CALCIUM) 950 MG tablet Take 1 tablet by mouth 2 (two) times daily.   Yes Historical Provider, MD  cholecalciferol (VITAMIN D) 1000 UNITS tablet Take 1,000 Units by mouth every morning.   Yes Historical Provider, MD  digoxin (LANOXIN) 0.125 MG tablet Take 0.125 mg by mouth every morning.   Yes Historical Provider, MD  diltiazem (CARDIZEM CD) 180 MG 24 hr capsule Take 1 capsule (180 mg total) by mouth daily. 06/08/13  Yes KaLendon ColonelNP  furosemide (LASIX) 20 MG tablet Take 1 tablet (20 mg total) by mouth 2 (two) times daily. *Patient may take an additional tablet if needed* 05/25/13  Yes Nimish C Luther ParodyMD  HYDROcodone-acetaminophen (NORCO) 10-325 MG per tablet Take 1 tablet by mouth every 6 (six) hours as needed for moderate pain.   Yes Historical Provider, MD  lovastatin (MEVACOR) 10 MG tablet Take 10 mg by mouth at bedtime.  Yes Historical Provider, MD  omeprazole (PRILOSEC) 20 MG capsule Take 20 mg by mouth every morning.    Yes Historical Provider, MD  potassium chloride (K-DUR) 10 MEQ tablet Take 10 mEq by mouth daily.    Yes Historical Provider, MD  PROAIR HFA 108 (90 BASE) MCG/ACT inhaler Inhale 1 puff into the lungs every 6 (six) hours as needed. For shortness of breath 01/15/12  Yes Historical Provider, MD  sertraline (ZOLOFT) 50 MG tablet Take 50 mg by mouth at bedtime. 08/13/12  Yes Angus Ailene Ravel, MD  Simethicone (PHAZYME PO) Take 1 capsule by mouth daily as needed (for gas relief/stomach symptoms).   Yes Historical Provider, MD  warfarin (COUMADIN) 5 MG tablet Take 1/2 tablet daily except 1 tablet on Tuesdays, Thursdays and Saturdays 06/20/13  Yes Lendon Colonel, NP    Allergies:    Allergies  Allergen Reactions  . Metoprolol Shortness Of Breath    Intolerant to all BB  . Statins     Intolerant secondary to severe myalgias    Social History:  reports that he quit smoking about 14 months ago. His smoking use included Cigarettes. He has a 50 pack-year smoking history. He has never used smokeless tobacco. He reports that he does not drink alcohol or use illicit drugs.  Family History: Family History  Problem Relation Age of Onset  . Heart attack Father   . Coronary artery disease Other     Physical Exam: Filed Vitals:   07/06/13 1310 07/06/13 1741 07/06/13 1800  BP: 116/56 117/94 130/80  Pulse: 89 91 85  Temp: 97.5 F (36.4 C)    TempSrc: Oral    Resp: _0 Height: _1  (1.702 m)    Weight: 84.823 kg (187 lb)    SpO2: 97% 95% 86%   General appearance: alert, cooperative and no distress Head: Normocephalic, without obvious abnormality, atraumatic Eyes: negative, conjunctivae/corneas clear. PERRL, EOM's intact. Fundi benign. Nose: Nares normal. Septum midline. Mucosa normal. No drainage or sinus tenderness. Neck: no JVD and supple, symmetrical, trachea midline Lungs: crackles bilateral.  no w/r/r Heart: regular rate and rhythm, S1, S2 normal, no murmur, click, rub or gallop Abdomen: soft, non-tender; bowel sounds normal; no masses,  no organomegaly Extremities: edema trace Pulses: 2+ and symmetric Skin: Skin color, texture, turgor normal. No rashes or lesions Neurologic: Grossly normal    Labs on Admission:   Recent Labs  07/06/13 1444  NA 141  K 4.0  CL 102  CO2 27  GLUCOSE 98  BUN 21  CREATININE 1.15  CALCIUM 9.5    Recent Labs  07/06/13 1444  AST 20  ALT 10  ALKPHOS 90  BILITOT 0.9  PROT 7.7  ALBUMIN 3.9    Recent Labs  07/06/13 1444  LIPASE 12    Recent Labs  07/06/13 1444  WBC 6.0  HGB 10.7*  HCT 33.0*  MCV 87.3  PLT 237    Radiological Exams on Admission: Dg Chest 2 View  07/06/2013   CLINICAL  DATA:  Shortness of breath.  EXAM: CHEST - 2 VIEW  COMPARISON:  DG CHEST 1V PORT dated 05/23/2013; DG CHEST 2 VIEW dated 03/22/2013; CT ABDOMEN W/CM dated 01/03/2013  FINDINGS: There is interval enlargement of a right pleural effusion which may be partially loculated anterolaterally based on x-ray. There is associated atelectasis at the right lung base and potentially subtle infiltrate present. Stable chronic lung disease and cardiomegaly. Stable moderate-sized hiatal hernia. No pneumothorax or pulmonary edema identified.  The visualized skeletal structures are unremarkable.  IMPRESSION: Enlargement of right pleural effusion which is now moderate in volume and may be partially loculated anterolaterally based on chest x-ray. There may be associated infiltrate in the right lower lung as well.   Electronically Signed   By: Aletta Edouard M.D.   On: 07/06/2013 15:34   Ct Chest W Contrast  07/06/2013   CLINICAL DATA:  Shortness of breath, weakness and diarrhea.  EXAM: CT CHEST, ABDOMEN, AND PELVIS WITH CONTRAST  TECHNIQUE: Multidetector CT imaging of the chest, abdomen and pelvis was performed following the standard protocol during bolus administration of intravenous contrast.  CONTRAST:  14m OMNIPAQUE IOHEXOL 300 MG/ML SOLN, 1035mOMNIPAQUE IOHEXOL 300 MG/ML SOLN  COMPARISON:  CT scan 01/03/2013 and PET-CT 06/21/2012.  FINDINGS: CT CHEST FINDINGS  The chest wall is unremarkable. No supraclavicular or axillary mass or adenopathy. Small scattered lymph nodes are noted. A right humeral head prosthesis is noted with moderate artifact. The bony thorax is otherwise intact. No destructive bone lesions or spinal canal compromise.  The heart is enlarged. No pericardial effusion. There are borderline enlarged mediastinal and hilar lymph nodes but these appear relatively stable. The aorta is normal in caliber. No dissection. The esophagus is grossly normal.  There are bilateral pleural effusions, right greater than left with  fluid in the right major fissure. Patchy ground-glass opacities are noted in lungs along with small nodules. No overt pulmonary edema. Findings could be due to patchy inflammation/ infection/alveolitis. Lymphoma missed involvement is possible.  CT ABDOMEN AND PELVIS FINDINGS  The liver is unremarkable. No focal hepatic lesions. No biliary dilatation. The gallbladder is normal. No common bile duct dilatation. The pancreas is normal. The spleen is normal in size. No focal lesions. The adrenal glands and kidneys are unremarkable.  The stomach, duodenum, small bowel and colon are unremarkable. No inflammatory changes, mass lesions or obstructive findings. No mesenteric or retroperitoneal mass or adenopathy. The aorta and branch vessels are patent. The major venous structures are patent.  The bladder, prostate gland and seminal vesicles are unremarkable. No pelvic mass, adenopathy or free pelvic fluid collections. No inguinal mass or adenopathy.  The bony structures are unremarkable.  IMPRESSION: Stable mediastinal and hilar lymph nodes. No findings for lymphoma in the abdomen or pelvis.  Bilateral pleural effusions, right greater than left with overlying atelectasis. There are also patchy ground-glass opacities and small pulmonary nodules. This could be due to an inflammatory or atypical infectious process/alveolitis. Lymphomatous involvement is unlikely.  Stable cardiac enlargement.  No acute abdominal/pelvic findings, mass lesions or adenopathy.   Electronically Signed   By: MaKalman Jewels.D.   On: 07/06/2013 17:55   Ct Abdomen Pelvis W Contrast  07/06/2013   CLINICAL DATA:  Shortness of breath, weakness and diarrhea.  EXAM: CT CHEST, ABDOMEN, AND PELVIS WITH CONTRAST  TECHNIQUE: Multidetector CT imaging of the chest, abdomen and pelvis was performed following the standard protocol during bolus administration of intravenous contrast.  CONTRAST:  5037mMNIPAQUE IOHEXOL 300 MG/ML SOLN, 100m35mNIPAQUE IOHEXOL 300  MG/ML SOLN  COMPARISON:  CT scan 01/03/2013 and PET-CT 06/21/2012.  FINDINGS: CT CHEST FINDINGS  The chest wall is unremarkable. No supraclavicular or axillary mass or adenopathy. Small scattered lymph nodes are noted. A right humeral head prosthesis is noted with moderate artifact. The bony thorax is otherwise intact. No destructive bone lesions or spinal canal compromise.  The heart is enlarged. No pericardial effusion. There are borderline enlarged mediastinal and hilar lymph nodes  but these appear relatively stable. The aorta is normal in caliber. No dissection. The esophagus is grossly normal.  There are bilateral pleural effusions, right greater than left with fluid in the right major fissure. Patchy ground-glass opacities are noted in lungs along with small nodules. No overt pulmonary edema. Findings could be due to patchy inflammation/ infection/alveolitis. Lymphoma missed involvement is possible.  CT ABDOMEN AND PELVIS FINDINGS  The liver is unremarkable. No focal hepatic lesions. No biliary dilatation. The gallbladder is normal. No common bile duct dilatation. The pancreas is normal. The spleen is normal in size. No focal lesions. The adrenal glands and kidneys are unremarkable.  The stomach, duodenum, small bowel and colon are unremarkable. No inflammatory changes, mass lesions or obstructive findings. No mesenteric or retroperitoneal mass or adenopathy. The aorta and branch vessels are patent. The major venous structures are patent.  The bladder, prostate gland and seminal vesicles are unremarkable. No pelvic mass, adenopathy or free pelvic fluid collections. No inguinal mass or adenopathy.  The bony structures are unremarkable.  IMPRESSION: Stable mediastinal and hilar lymph nodes. No findings for lymphoma in the abdomen or pelvis.  Bilateral pleural effusions, right greater than left with overlying atelectasis. There are also patchy ground-glass opacities and small pulmonary nodules. This could be due  to an inflammatory or atypical infectious process/alveolitis. Lymphomatous involvement is unlikely.  Stable cardiac enlargement.  No acute abdominal/pelvic findings, mass lesions or adenopathy.   Electronically Signed   By: Kalman Jewels M.D.   On: 07/06/2013 17:55    Assessment/Plan  73 yo male with acute on chronic chf exacerbation and bilateral pleural effusions  Principal Problem:   Acute on chronic diastolic CHF (congestive heart failure)-  chf pathway.  Iv lasix 62m bid.  Oxygen supplementation.  Should improve with diuresis in next couple of days.  In the absence of fever, or significant leukocytosis will not add abx at this time, unless signs/symptoms suggestive of pna develop.  Active Problems:  Stable unless o/w noted   Essential hypertension, benign   Atrial fibrillation  Rate controlled   Chronic anticoagulation  Ck coags, clarify coumadin dosing   Follicular lymphoma  In remission   Nonischemic cardiomyopathy  Echo last month with worsening systolic function   Mitral regurgitation  Admit to tele bed.  pcp is dr kKarie Kirks  Full code.  Raedyn Wenke A DShanon Brow4/15/2015, 7:23 PM

## 2013-07-07 ENCOUNTER — Inpatient Hospital Stay (HOSPITAL_COMMUNITY): Payer: Medicare Other

## 2013-07-07 LAB — BASIC METABOLIC PANEL
BUN: 16 mg/dL (ref 6–23)
CALCIUM: 9.5 mg/dL (ref 8.4–10.5)
CHLORIDE: 101 meq/L (ref 96–112)
CO2: 27 meq/L (ref 19–32)
Creatinine, Ser: 1.02 mg/dL (ref 0.50–1.35)
GFR calc Af Amer: 82 mL/min — ABNORMAL LOW (ref >90)
GFR calc non Af Amer: 70 mL/min — ABNORMAL LOW (ref >90)
Glucose, Bld: 94 mg/dL (ref 70–99)
Potassium: 3.5 mEq/L — ABNORMAL LOW (ref 3.7–5.3)
SODIUM: 143 meq/L (ref 137–147)

## 2013-07-07 LAB — PROTIME-INR
INR: 2.71 — AB (ref 0.00–1.49)
PROTHROMBIN TIME: 27.8 s — AB (ref 11.6–15.2)

## 2013-07-07 MED ORDER — WARFARIN - PHARMACIST DOSING INPATIENT: Status: DC

## 2013-07-07 MED ORDER — FUROSEMIDE 10 MG/ML IJ SOLN
40.0000 mg | Freq: Two times a day (BID) | INTRAMUSCULAR | Status: DC
  Administered 2013-07-07 (×2): 40 mg via INTRAVENOUS
  Filled 2013-07-07 (×2): qty 4

## 2013-07-07 MED ORDER — HYDROCODONE-ACETAMINOPHEN 10-325 MG PO TABS
1.0000 | ORAL_TABLET | ORAL | Status: DC | PRN
  Administered 2013-07-07 – 2013-07-08 (×3): 1 via ORAL
  Filled 2013-07-07 (×3): qty 1

## 2013-07-07 MED ORDER — WARFARIN SODIUM 2.5 MG PO TABS
2.5000 mg | ORAL_TABLET | Freq: Once | ORAL | Status: AC
Start: 2013-07-07 — End: 2013-07-07
  Administered 2013-07-07: 2.5 mg via ORAL
  Filled 2013-07-07: qty 1

## 2013-07-07 MED ORDER — POTASSIUM CHLORIDE CRYS ER 20 MEQ PO TBCR
40.0000 meq | EXTENDED_RELEASE_TABLET | Freq: Every day | ORAL | Status: DC
  Administered 2013-07-07 – 2013-07-08 (×2): 40 meq via ORAL
  Filled 2013-07-07 (×2): qty 2

## 2013-07-07 NOTE — Progress Notes (Signed)
Charles [name]:[id number] DOB: 02-01-1941 DOA: 07/06/2013 PCP: [name], MD   Subjective: This man was admitted with acute on chronic systolic congestive heart failure yesterday. He feels much improved with intravenous Lasix, almost back to his baseline. He has chronic atrial fibrillation. He is on chronic anticoagulation therapy with warfarin.           Physical Exam: Blood pressure 134/69, pulse 84, temperature 97.9 F (36.6 C), temperature source Oral, resp. rate 23, height 5\' 7"  (1.702 m), weight 84 kg (185 lb 3 oz), SpO2 95.00%. He looks systemically well and comfortable at rest. There is no increased work of breathing. Heart sounds are present in atrial fibrillation, ventricular rate is controlled. Lung fields do show still inspiratory crackles in the mid and lower zones and reduced air entry on the right mid and lower zone, consistent with a pleural effusion. There is no significant pitting peripheral edema in his legs. Jugular venous pressure is not raised. I cannot hear any bronchial breathing or wheezing in his lungs. He is alert and oriented.   Investigations:  Recent Results (from the past 240 hour(s))  CULTURE, BLOOD (ROUTINE X 2)     Status: None   Collection Time    07/06/13  4:22 PM      Result Value Ref Range Status   Specimen Description BLOOD LEFT ANTECUBITAL   Final   Special Requests     Final   Value: BOTTLES DRAWN AEROBIC AND ANAEROBIC AEB=10CC ANA=6CC   Culture PENDING   Incomplete   Report Status PENDING   Incomplete  CULTURE, BLOOD (ROUTINE X 2)     Status: None   Collection Time    07/06/13  4:32 PM      Result Value Ref Range Status   Specimen Description BLOOD LEFT HAND   Final   Special Requests     Final   Value: BOTTLES DRAWN AEROBIC AND ANAEROBIC AEB=8CC ANA=5CC   Culture PENDING   Incomplete   Report Status PENDING   Incomplete  MRSA PCR SCREENING     Status: None   Collection Time    07/06/13  9:25 PM      Result Value  Ref Range Status   MRSA by PCR NEGATIVE  NEGATIVE Final   Comment:            The GeneXpert MRSA Assay (FDA     approved for NASAL specimens     only), is one component of a     comprehensive MRSA colonization     surveillance program. It is not     intended to diagnose MRSA     infection nor to guide or     monitor treatment for     MRSA infections.     Basic Metabolic Panel:  Recent Labs  07/06/13 1444 07/07/13 0550  NA 141 143  K 4.0 3.5*  CL 102 101  CO2 27 27  GLUCOSE 98 94  BUN 21 16  CREATININE 1.15 1.02  CALCIUM 9.5 9.5   Liver Function Tests:  Recent Labs  07/06/13 1444  AST 20  ALT 10  ALKPHOS 90  BILITOT 0.9  PROT 7.7  ALBUMIN 3.9     CBC:  Recent Labs  07/06/13 1444  WBC 6.0  HGB 10.7*  HCT 33.0*  MCV 87.3  PLT 237    Dg Chest 2 View  07/06/2013   CLINICAL DATA:  Shortness of breath.  EXAM: CHEST - 2 VIEW  COMPARISON:  DG  CHEST 1V PORT dated 05/23/2013; DG CHEST 2 VIEW dated 03/22/2013; CT ABDOMEN W/CM dated 01/03/2013  FINDINGS: There is interval enlargement of a right pleural effusion which may be partially loculated anterolaterally based on x-ray. There is associated atelectasis at the right lung base and potentially subtle infiltrate present. Stable chronic lung disease and cardiomegaly. Stable moderate-sized hiatal hernia. No pneumothorax or pulmonary edema identified. The visualized skeletal structures are unremarkable.  IMPRESSION: Enlargement of right pleural effusion which is now moderate in volume and may be partially loculated anterolaterally based on chest x-ray. There may be associated infiltrate in the right lower lung as well.   Electronically Signed   By: Aletta Edouard M.D.   On: 07/06/2013 15:34   Ct Chest W Contrast  07/06/2013   CLINICAL DATA:  Shortness of breath, weakness and diarrhea.  EXAM: CT CHEST, ABDOMEN, AND PELVIS WITH CONTRAST  TECHNIQUE: Multidetector CT imaging of the chest, abdomen and pelvis was performed  following the standard protocol during bolus administration of intravenous contrast.  CONTRAST:  22mL OMNIPAQUE IOHEXOL 300 MG/ML SOLN, 189mL OMNIPAQUE IOHEXOL 300 MG/ML SOLN  COMPARISON:  CT scan 01/03/2013 and PET-CT 06/21/2012.  FINDINGS: CT CHEST FINDINGS  The chest wall is unremarkable. No supraclavicular or axillary mass or adenopathy. Small scattered lymph nodes are noted. A right humeral head prosthesis is noted with moderate artifact. The bony thorax is otherwise intact. No destructive bone lesions or spinal canal compromise.  The heart is enlarged. No pericardial effusion. There are borderline enlarged mediastinal and hilar lymph nodes but these appear relatively stable. The aorta is normal in caliber. No dissection. The esophagus is grossly normal.  There are bilateral pleural effusions, right greater than left with fluid in the right major fissure. Patchy ground-glass opacities are noted in lungs along with small nodules. No overt pulmonary edema. Findings could be due to patchy inflammation/ infection/alveolitis. Lymphoma missed involvement is possible.  CT ABDOMEN AND PELVIS FINDINGS  The liver is unremarkable. No focal hepatic lesions. No biliary dilatation. The gallbladder is normal. No common bile duct dilatation. The pancreas is normal. The spleen is normal in size. No focal lesions. The adrenal glands and kidneys are unremarkable.  The stomach, duodenum, small bowel and colon are unremarkable. No inflammatory changes, mass lesions or obstructive findings. No mesenteric or retroperitoneal mass or adenopathy. The aorta and branch vessels are patent. The major venous structures are patent.  The bladder, prostate gland and seminal vesicles are unremarkable. No pelvic mass, adenopathy or free pelvic fluid collections. No inguinal mass or adenopathy.  The bony structures are unremarkable.  IMPRESSION: Stable mediastinal and hilar lymph nodes. No findings for lymphoma in the abdomen or pelvis.  Bilateral  pleural effusions, right greater than left with overlying atelectasis. There are also patchy ground-glass opacities and small pulmonary nodules. This could be due to an inflammatory or atypical infectious process/alveolitis. Lymphomatous involvement is unlikely.  Stable cardiac enlargement.  No acute abdominal/pelvic findings, mass lesions or adenopathy.   Electronically Signed   By: Kalman Jewels M.D.   On: 07/06/2013 17:55   Ct Abdomen Pelvis W Contrast  07/06/2013   CLINICAL DATA:  Shortness of breath, weakness and diarrhea.  EXAM: CT CHEST, ABDOMEN, AND PELVIS WITH CONTRAST  TECHNIQUE: Multidetector CT imaging of the chest, abdomen and pelvis was performed following the standard protocol during bolus administration of intravenous contrast.  CONTRAST:  36mL OMNIPAQUE IOHEXOL 300 MG/ML SOLN, 164mL OMNIPAQUE IOHEXOL 300 MG/ML SOLN  COMPARISON:  CT scan 01/03/2013 and PET-CT  06/21/2012.  FINDINGS: CT CHEST FINDINGS  The chest wall is unremarkable. No supraclavicular or axillary mass or adenopathy. Small scattered lymph nodes are noted. A right humeral head prosthesis is noted with moderate artifact. The bony thorax is otherwise intact. No destructive bone lesions or spinal canal compromise.  The heart is enlarged. No pericardial effusion. There are borderline enlarged mediastinal and hilar lymph nodes but these appear relatively stable. The aorta is normal in caliber. No dissection. The esophagus is grossly normal.  There are bilateral pleural effusions, right greater than left with fluid in the right major fissure. Patchy ground-glass opacities are noted in lungs along with small nodules. No overt pulmonary edema. Findings could be due to patchy inflammation/ infection/alveolitis. Lymphoma missed involvement is possible.  CT ABDOMEN AND PELVIS FINDINGS  The liver is unremarkable. No focal hepatic lesions. No biliary dilatation. The gallbladder is normal. No common bile duct dilatation. The pancreas is normal.  The spleen is normal in size. No focal lesions. The adrenal glands and kidneys are unremarkable.  The stomach, duodenum, small bowel and colon are unremarkable. No inflammatory changes, mass lesions or obstructive findings. No mesenteric or retroperitoneal mass or adenopathy. The aorta and branch vessels are patent. The major venous structures are patent.  The bladder, prostate gland and seminal vesicles are unremarkable. No pelvic mass, adenopathy or free pelvic fluid collections. No inguinal mass or adenopathy.  The bony structures are unremarkable.  IMPRESSION: Stable mediastinal and hilar lymph nodes. No findings for lymphoma in the abdomen or pelvis.  Bilateral pleural effusions, right greater than left with overlying atelectasis. There are also patchy ground-glass opacities and small pulmonary nodules. This could be due to an inflammatory or atypical infectious process/alveolitis. Lymphomatous involvement is unlikely.  Stable cardiac enlargement.  No acute abdominal/pelvic findings, mass lesions or adenopathy.   Electronically Signed   By: Kalman Jewels M.D.   On: 07/06/2013 17:55      Medications: I have reviewed the patient's current medications.  Impression: 1. Acute on chronic systolic congestive heart failure. Nonischemic cardio myopathy. 2. Hypertension. 3.Chronic atrial fibrillation on chronic warfarin therapy. 4. History of lymphoma, appears to be stable at this time.     Plan: 1. I would still continue intravenous Lasix as he clinically still has pleural effusion present. 2. Repeat chest x-ray today.  Consultants:  None.   Procedures:  None.   Antibiotics:  None. I do not see any clear  evidence of infection at this time.                   Code Status: Full code.  Family Communication: I discussed the plan with patient at the bedside.   Disposition Plan: Home when medically stable.  Time spent: 15 minutes.   LOS: 1 day   Cameron Katayama C Burnette Sautter   07/07/2013,  8:42 AM

## 2013-07-07 NOTE — Progress Notes (Signed)
ANTICOAGULATION CONSULT NOTE - Initial Consult  Pharmacy Consult for Coumadin Indication: atrial fibrillation  Allergies  Allergen Reactions  . Metoprolol Shortness Of Breath    Intolerant to all BB  . Statins     Intolerant secondary to severe myalgias    Patient Measurements: Height: 5\' 7"  (170.2 cm) Weight: 185 lb 3 oz (84 kg) IBW/kg (Calculated) : 66.1  Vital Signs: Temp: 98 F (36.7 C) (04/16 1130) Temp src: Oral (04/16 1130) BP: 126/83 mmHg (04/16 1300) Pulse Rate: 84 (04/16 1200)  Labs:  Recent Labs  07/06/13 1444 07/06/13 1953 07/07/13 0550 07/07/13 0944  HGB 10.7*  --   --   --   HCT 33.0*  --   --   --   PLT 237  --   --   --   LABPROT 34.3*  --   --  27.8*  INR 3.56*  --   --  2.71*  CREATININE 1.15  --  1.02  --   TROPONINI  --  <0.30  --   --     Estimated Creatinine Clearance: 65.9 ml/min (by C-G formula based on Cr of 1.02).   Medical History: Past Medical History  Diagnosis Date  . Essential hypertension, benign   . Atrial fibrillation 10/2008  . 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 Peptic ulcer disease   . Degenerative joint disease     Knees and shoulders  . Pneumonia 2010  . Allergic rhinitis   . Anxiety   . Drug abuse   . Tobacco abuse     50 pack years  . Mitral regurgitation     Moderate to severe May 2014  . COPD (chronic obstructive pulmonary disease)   . 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 Basal cell carcinoma     Medications:  Prescriptions prior to admission  Medication Sig Dispense Refill  . ALPRAZolam (XANAX) 1 MG tablet Take 0.5 mg by mouth 3 (three) times daily.       . calcium citrate (CALCITRATE - DOSED IN MG ELEMENTAL CALCIUM) 950 MG tablet Take 1 tablet by mouth 2 (two) times daily.      . cholecalciferol (VITAMIN D) 1000 UNITS tablet Take 1,000 Units by mouth every morning.      . digoxin (LANOXIN) 0.125 MG tablet Take 0.125 mg by mouth every morning.       . diltiazem (CARDIZEM CD) 180 MG 24 hr capsule Take 1 capsule (180 mg total) by mouth daily.  90 capsule  3  . furosemide (LASIX) 20 MG tablet Take 1 tablet (20 mg total) by mouth 2 (two) times daily. *Patient may take an additional tablet if needed*  60 tablet  0  . HYDROcodone-acetaminophen (NORCO) 10-325 MG per tablet Take 1 tablet by mouth every 6 (six) hours as needed for moderate pain.      Marland Kitchen lovastatin (MEVACOR) 10 MG tablet Take 10 mg by mouth at bedtime.       Marland Kitchen omeprazole (PRILOSEC) 20 MG capsule Take 20 mg by mouth every morning.       . potassium chloride (K-DUR) 10 MEQ tablet Take 10 mEq by mouth daily.       Marland Kitchen PROAIR HFA 108 (90 BASE) MCG/ACT inhaler Inhale 1 puff into the lungs every 6 (six) hours as needed. For shortness of breath      . sertraline (ZOLOFT) 50 MG tablet Take 50 mg by mouth at bedtime.      Marland Kitchen  Simethicone (PHAZYME PO) Take 1 capsule by mouth daily as needed (for gas relief/stomach symptoms).      . warfarin (COUMADIN) 5 MG tablet Take 2.5-5 mg by mouth daily. Take 1/2 tablet (2.5mg ) on Mon, Wed, Fri, Sun and 1 tab (5mg ) on Tues, Thurs, Sat.        Assessment: 73 yo M on chronic warfarin for Afib.  Home dose listed above.  Dose was recently increased from 20-->25mg /week.   INR elevated on admission, but has trended to goal range today after dose held.   No bleeding noted.   Goal of Therapy:  INR 2-3   Plan:  Coumadin 2.5mg  po x1 today Daily INR  Lavonia Drafts Salene Mohamud 07/07/2013,1:56 PM

## 2013-07-07 NOTE — Progress Notes (Signed)
Nutrition Brief Note  Patient identified on the Malnutrition Screening Tool (MST) Report  Wt Readings from Last 15 Encounters:  07/07/13 185 lb 3 oz (84 kg)  06/16/13 190 lb (86.183 kg)  06/08/13 192 lb 12.8 oz (87.454 kg)  05/25/13 189 lb 8.3 oz (85.964 kg)  04/21/13 198 lb 11.2 oz (90.13 kg)  03/22/13 194 lb (87.998 kg)  01/06/13 190 lb 11.2 oz (86.501 kg)  12/30/12 189 lb (85.73 kg)  12/21/12 191 lb (86.637 kg)  11/01/12 177 lb (80.287 kg)  10/28/12 182 lb 8 oz (82.781 kg)  10/06/12 183 lb 9.6 oz (83.28 kg)  09/20/12 194 lb (87.998 kg)  09/07/12 189 lb (85.73 kg)  08/13/12 186 lb 14.4 oz (84.777 kg)   Pt admitted with CHF. He is on home diuretic therapy. Noted hx of weight fluctuations, UBW ranges from 177-199#. He received diuretics in ER. Weight changes not significant for time frame.   Body mass index is 29 kg/(m^2). Patient meets criteria for overweight based on current BMI.   Current diet order is Heart Healthy, patient is consuming approximately n/a% of meals at this time. Labs and medications reviewed.   No nutrition interventions warranted at this time. If nutrition issues arise, please consult RD.   Charles [name], RD, LDN Pager: [phone]

## 2013-07-08 LAB — BASIC METABOLIC PANEL
BUN: 17 mg/dL (ref 6–23)
CO2: 28 mEq/L (ref 19–32)
Calcium: 9.6 mg/dL (ref 8.4–10.5)
Chloride: 98 mEq/L (ref 96–112)
Creatinine, Ser: 1.23 mg/dL (ref 0.50–1.35)
GFR, EST AFRICAN AMERICAN: 65 mL/min — AB (ref >90)
GFR, EST NON AFRICAN AMERICAN: 56 mL/min — AB (ref >90)
Glucose, Bld: 96 mg/dL (ref 70–99)
POTASSIUM: 3.6 meq/L — AB (ref 3.7–5.3)
SODIUM: 142 meq/L (ref 137–147)

## 2013-07-08 LAB — PROTIME-INR
INR: 2.76 — ABNORMAL HIGH (ref 0.00–1.49)
PROTHROMBIN TIME: 28.2 s — AB (ref 11.6–15.2)

## 2013-07-08 MED ORDER — FUROSEMIDE 40 MG PO TABS: 40.0000 mg | ORAL_TABLET | Freq: Two times a day (BID) | ORAL | Status: DC

## 2013-07-08 MED ORDER — WARFARIN SODIUM 2.5 MG PO TABS: 2.5000 mg | ORAL_TABLET | Freq: Once | ORAL | Status: DC

## 2013-07-08 MED ORDER — POTASSIUM CHLORIDE CRYS ER 20 MEQ PO TBCR: 40.0000 meq | EXTENDED_RELEASE_TABLET | Freq: Every day | ORAL | Status: DC

## 2013-07-08 NOTE — Progress Notes (Signed)
ANTICOAGULATION CONSULT NOTE - follow up  Pharmacy Consult for Coumadin Indication: atrial fibrillation  Allergies  Allergen Reactions  . Metoprolol Shortness Of Breath    Intolerant to all BB  . Statins     Intolerant secondary to severe myalgias   Patient Measurements: Height: 5\' 7"  (170.2 cm) Weight: 177 lb 4 oz (80.4 kg) IBW/kg (Calculated) : 66.1  Vital Signs: Temp: 98 F (36.7 C) (04/17 0400) Temp src: Oral (04/17 0400) BP: 118/83 mmHg (04/17 0600) Pulse Rate: 70 (04/17 0600)  Labs:  Recent Labs  07/06/13 1444 07/06/13 1953 07/07/13 0550 07/07/13 0944 07/08/13 0453  HGB 10.7*  --   --   --   --   HCT 33.0*  --   --   --   --   PLT 237  --   --   --   --   LABPROT 34.3*  --   --  27.8* 28.2*  INR 3.56*  --   --  2.71* 2.76*  CREATININE 1.15  --  1.02  --  1.23  TROPONINI  --  <0.30  --   --   --    Estimated Creatinine Clearance: 53.5 ml/min (by C-G formula based on Cr of 1.23).  Medical History: Past Medical History  Diagnosis Date  . Essential hypertension, benign   . Atrial fibrillation 10/2008  . 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 Peptic ulcer disease   . Degenerative joint disease     Knees and shoulders  . Pneumonia 2010  . Allergic rhinitis   . Anxiety   . Drug abuse   . Tobacco abuse     50 pack years  . Mitral regurgitation     Moderate to severe May 2014  . COPD (chronic obstructive pulmonary disease)   . Follicular lymphoma 0/08/3014  . Basal cell carcinoma    Medications:  Prescriptions prior to admission  Medication Sig Dispense Refill  . ALPRAZolam (XANAX) 1 MG tablet Take 0.5 mg by mouth 3 (three) times daily.       . calcium citrate (CALCITRATE - DOSED IN MG ELEMENTAL CALCIUM) 950 MG tablet Take 1 tablet by mouth 2 (two) times daily.      . cholecalciferol (VITAMIN D) 1000 UNITS tablet Take 1,000 Units by mouth every morning.      . digoxin (LANOXIN) 0.125 MG  tablet Take 0.125 mg by mouth every morning.      . diltiazem (CARDIZEM CD) 180 MG 24 hr capsule Take 1 capsule (180 mg total) by mouth daily.  90 capsule  3  . furosemide (LASIX) 20 MG tablet Take 1 tablet (20 mg total) by mouth 2 (two) times daily. *Patient may take an additional tablet if needed*  60 tablet  0  . HYDROcodone-acetaminophen (NORCO) 10-325 MG per tablet Take 1 tablet by mouth every 6 (six) hours as needed for moderate pain.      Marland Kitchen lovastatin (MEVACOR) 10 MG tablet Take 10 mg by mouth at bedtime.       Marland Kitchen omeprazole (PRILOSEC) 20 MG capsule Take 20 mg by mouth every morning.       . potassium chloride (K-DUR) 10 MEQ tablet Take 10 mEq by mouth daily.       Marland Kitchen PROAIR HFA 108 (90 BASE) MCG/ACT inhaler Inhale 1 puff into the lungs every 6 (six) hours as needed. For shortness of breath      . sertraline (ZOLOFT)  50 MG tablet Take 50 mg by mouth at bedtime.      . Simethicone (PHAZYME PO) Take 1 capsule by mouth daily as needed (for gas relief/stomach symptoms).      . warfarin (COUMADIN) 5 MG tablet Take 2.5-5 mg by mouth daily. Take 1/2 tablet (2.5mg ) on Mon, Wed, Fri, Sun and 1 tab (5mg ) on Tues, Thurs, Sat.       Assessment: 73 yo M on chronic warfarin for Afib.  Home dose listed above.  Dose was recently increased from 20--> 25mg /week.   INR elevated on admission, but has now trended to goal range today after dose held.  No bleeding noted.   Goal of Therapy:  INR 2-3   Plan:   Coumadin 2.5mg  po x1 today  Daily INR  Home dosing regimen may need adjustment / simplification  Wendie Diskin A Maahi Lannan 07/08/2013,8:01 AM

## 2013-07-08 NOTE — Discharge Summary (Signed)
Physician Discharge Summary  Patient ID: Charles [name] MRN: [id number] DOB/AGE: 74-May-1942 74 y.o. Primary Care Physician:KNOWLTON,STEPHEN D, MD Admit date: 07/06/2013 Discharge date: 07/08/2013    Discharge Diagnoses:  1. Acute on chronic systolic congestive heart failure. Improving. 2. Chronic atrial fibrillation on chronic anticoagulation therapy. 3. Nonischemic cardio myopathy. 4. Hypertension. 5. History of lymphoma, stable/in remission.     Medication List    STOP taking these medications       potassium chloride 10 MEQ tablet  Commonly known as:  K-DUR  Replaced by:  potassium chloride SA 20 MEQ tablet      TAKE these medications       ALPRAZolam 1 MG tablet  Commonly known as:  XANAX  Take 0.5 mg by mouth 3 (three) times daily.     calcium citrate 950 MG tablet  Commonly known as:  CALCITRATE - dosed in mg elemental calcium  Take 1 tablet by mouth 2 (two) times daily.     cholecalciferol 1000 UNITS tablet  Commonly known as:  VITAMIN D  Take 1,000 Units by mouth every morning.     digoxin 0.125 MG tablet  Commonly known as:  LANOXIN  Take 0.125 mg by mouth every morning.     diltiazem 180 MG 24 hr capsule  Commonly known as:  CARDIZEM CD  Take 1 capsule (180 mg total) by mouth daily.     furosemide 40 MG tablet  Commonly known as:  LASIX  Take 1 tablet (40 mg total) by mouth 2 (two) times daily. *Patient may take an additional tablet if needed*     HYDROcodone-acetaminophen 10-325 MG per tablet  Commonly known as:  NORCO  Take 1 tablet by mouth every 6 (six) hours as needed for moderate pain.     lovastatin 10 MG tablet  Commonly known as:  MEVACOR  Take 10 mg by mouth at bedtime.     omeprazole 20 MG capsule  Commonly known as:  PRILOSEC  Take 20 mg by mouth every morning.     PHAZYME PO  Take 1 capsule by mouth daily as needed (for gas relief/stomach symptoms).     potassium chloride SA 20 MEQ tablet  Commonly known as:  K-DUR,KLOR-CON   Take 2 tablets (40 mEq total) by mouth daily.     PROAIR HFA 108 (90 BASE) MCG/ACT inhaler  Generic drug:  albuterol  Inhale 1 puff into the lungs every 6 (six) hours as needed. For shortness of breath     sertraline 50 MG tablet  Commonly known as:  ZOLOFT  Take 50 mg by mouth at bedtime.     warfarin 5 MG tablet  Commonly known as:  COUMADIN  Take 2.5-5 mg by mouth daily. Take 1/2 tablet (2.5mg ) on Mon, Wed, Fri, Sun and 1 tab (5mg ) on Tues, Thurs, Sat.        Discharged Condition: Stable and improved.    Consults: None.  Significant Diagnostic Studies: Dg Chest 1 View  07/07/2013   CLINICAL DATA:  Congestive heart failure  EXAM: CHEST - 1 VIEW  COMPARISON:  July 06, 2013  FINDINGS: The heart size and mediastinal contours are stable. The heart size is enlarged. Small right pleural effusion with consolidation of right lung base is unchanged. There is cephalization of flow without frank pulmonary edema. The visualized skeletal structures are stable.  IMPRESSION: Persistent small right pleural effusion with consolidation of right lung base unchanged compared to prior exam. There is cephalization of flow without frank  evidence of pulmonary edema.   Electronically Signed   By: Abelardo Diesel M.D.   On: 07/07/2013 10:16   Dg Chest 2 View  07/06/2013   CLINICAL DATA:  Shortness of breath.  EXAM: CHEST - 2 VIEW  COMPARISON:  DG CHEST 1V PORT dated 05/23/2013; DG CHEST 2 VIEW dated 03/22/2013; CT ABDOMEN W/CM dated 01/03/2013  FINDINGS: There is interval enlargement of a right pleural effusion which may be partially loculated anterolaterally based on x-ray. There is associated atelectasis at the right lung base and potentially subtle infiltrate present. Stable chronic lung disease and cardiomegaly. Stable moderate-sized hiatal hernia. No pneumothorax or pulmonary edema identified. The visualized skeletal structures are unremarkable.  IMPRESSION: Enlargement of right pleural effusion which is now  moderate in volume and may be partially loculated anterolaterally based on chest x-ray. There may be associated infiltrate in the right lower lung as well.   Electronically Signed   By: Aletta Edouard M.D.   On: 07/06/2013 15:34   Ct Chest W Contrast  07/06/2013   CLINICAL DATA:  Shortness of breath, weakness and diarrhea.  EXAM: CT CHEST, ABDOMEN, AND PELVIS WITH CONTRAST  TECHNIQUE: Multidetector CT imaging of the chest, abdomen and pelvis was performed following the standard protocol during bolus administration of intravenous contrast.  CONTRAST:  39mL OMNIPAQUE IOHEXOL 300 MG/ML SOLN, 191mL OMNIPAQUE IOHEXOL 300 MG/ML SOLN  COMPARISON:  CT scan 01/03/2013 and PET-CT 06/21/2012.  FINDINGS: CT CHEST FINDINGS  The chest wall is unremarkable. No supraclavicular or axillary mass or adenopathy. Small scattered lymph nodes are noted. A right humeral head prosthesis is noted with moderate artifact. The bony thorax is otherwise intact. No destructive bone lesions or spinal canal compromise.  The heart is enlarged. No pericardial effusion. There are borderline enlarged mediastinal and hilar lymph nodes but these appear relatively stable. The aorta is normal in caliber. No dissection. The esophagus is grossly normal.  There are bilateral pleural effusions, right greater than left with fluid in the right major fissure. Patchy ground-glass opacities are noted in lungs along with small nodules. No overt pulmonary edema. Findings could be due to patchy inflammation/ infection/alveolitis. Lymphoma missed involvement is possible.  CT ABDOMEN AND PELVIS FINDINGS  The liver is unremarkable. No focal hepatic lesions. No biliary dilatation. The gallbladder is normal. No common bile duct dilatation. The pancreas is normal. The spleen is normal in size. No focal lesions. The adrenal glands and kidneys are unremarkable.  The stomach, duodenum, small bowel and colon are unremarkable. No inflammatory changes, mass lesions or  obstructive findings. No mesenteric or retroperitoneal mass or adenopathy. The aorta and branch vessels are patent. The major venous structures are patent.  The bladder, prostate gland and seminal vesicles are unremarkable. No pelvic mass, adenopathy or free pelvic fluid collections. No inguinal mass or adenopathy.  The bony structures are unremarkable.  IMPRESSION: Stable mediastinal and hilar lymph nodes. No findings for lymphoma in the abdomen or pelvis.  Bilateral pleural effusions, right greater than left with overlying atelectasis. There are also patchy ground-glass opacities and small pulmonary nodules. This could be due to an inflammatory or atypical infectious process/alveolitis. Lymphomatous involvement is unlikely.  Stable cardiac enlargement.  No acute abdominal/pelvic findings, mass lesions or adenopathy.   Electronically Signed   By: Kalman Jewels M.D.   On: 07/06/2013 17:55   Ct Abdomen Pelvis W Contrast  07/06/2013   CLINICAL DATA:  Shortness of breath, weakness and diarrhea.  EXAM: CT CHEST, ABDOMEN, AND PELVIS WITH CONTRAST  TECHNIQUE: Multidetector CT imaging of the chest, abdomen and pelvis was performed following the standard protocol during bolus administration of intravenous contrast.  CONTRAST:  25mL OMNIPAQUE IOHEXOL 300 MG/ML SOLN, 153mL OMNIPAQUE IOHEXOL 300 MG/ML SOLN  COMPARISON:  CT scan 01/03/2013 and PET-CT 06/21/2012.  FINDINGS: CT CHEST FINDINGS  The chest wall is unremarkable. No supraclavicular or axillary mass or adenopathy. Small scattered lymph nodes are noted. A right humeral head prosthesis is noted with moderate artifact. The bony thorax is otherwise intact. No destructive bone lesions or spinal canal compromise.  The heart is enlarged. No pericardial effusion. There are borderline enlarged mediastinal and hilar lymph nodes but these appear relatively stable. The aorta is normal in caliber. No dissection. The esophagus is grossly normal.  There are bilateral pleural  effusions, right greater than left with fluid in the right major fissure. Patchy ground-glass opacities are noted in lungs along with small nodules. No overt pulmonary edema. Findings could be due to patchy inflammation/ infection/alveolitis. Lymphoma missed involvement is possible.  CT ABDOMEN AND PELVIS FINDINGS  The liver is unremarkable. No focal hepatic lesions. No biliary dilatation. The gallbladder is normal. No common bile duct dilatation. The pancreas is normal. The spleen is normal in size. No focal lesions. The adrenal glands and kidneys are unremarkable.  The stomach, duodenum, small bowel and colon are unremarkable. No inflammatory changes, mass lesions or obstructive findings. No mesenteric or retroperitoneal mass or adenopathy. The aorta and branch vessels are patent. The major venous structures are patent.  The bladder, prostate gland and seminal vesicles are unremarkable. No pelvic mass, adenopathy or free pelvic fluid collections. No inguinal mass or adenopathy.  The bony structures are unremarkable.  IMPRESSION: Stable mediastinal and hilar lymph nodes. No findings for lymphoma in the abdomen or pelvis.  Bilateral pleural effusions, right greater than left with overlying atelectasis. There are also patchy ground-glass opacities and small pulmonary nodules. This could be due to an inflammatory or atypical infectious process/alveolitis. Lymphomatous involvement is unlikely.  Stable cardiac enlargement.  No acute abdominal/pelvic findings, mass lesions or adenopathy.   Electronically Signed   By: Kalman Jewels M.D.   On: 07/06/2013 17:55    Lab Results: Basic Metabolic Panel:  Recent Labs  07/07/13 0550 07/08/13 0453  NA 143 142  K 3.5* 3.6*  CL 101 98  CO2 27 28  GLUCOSE 94 96  BUN 16 17  CREATININE 1.02 1.23  CALCIUM 9.5 9.6   Liver Function Tests:  Recent Labs  07/06/13 1444  AST 20  ALT 10  ALKPHOS 90  BILITOT 0.9  PROT 7.7  ALBUMIN 3.9     CBC:  Recent Labs   07/06/13 1444  WBC 6.0  HGB 10.7*  HCT 33.0*  MCV 87.3  PLT 237    Recent Results (from the past 240 hour(s))  CULTURE, BLOOD (ROUTINE X 2)     Status: None   Collection Time    07/06/13  4:22 PM      Result Value Ref Range Status   Specimen Description BLOOD LEFT ANTECUBITAL   Final   Special Requests     Final   Value: BOTTLES DRAWN AEROBIC AND ANAEROBIC AEB=10CC ANA=6CC   Culture NO GROWTH 1 DAY   Final   Report Status PENDING   Incomplete  CULTURE, BLOOD (ROUTINE X 2)     Status: None   Collection Time    07/06/13  4:32 PM      Result Value Ref Range Status  Specimen Description BLOOD LEFT HAND   Final   Special Requests     Final   Value: BOTTLES DRAWN AEROBIC AND ANAEROBIC AEB=8CC ANA=5CC   Culture NO GROWTH 1 DAY   Final   Report Status PENDING   Incomplete  MRSA PCR SCREENING     Status: None   Collection Time    07/06/13  9:25 PM      Result Value Ref Range Status   MRSA by PCR NEGATIVE  NEGATIVE Final   Comment:            The GeneXpert MRSA Assay (FDA     approved for NASAL specimens     only), is one component of a     comprehensive MRSA colonization     surveillance program. It is not     intended to diagnose MRSA     infection nor to guide or     monitor treatment for     MRSA infections.     Hospital Course: This very pleasant 73 year old man presented to the hospital with symptoms of dyspnea. Please see initial history as outlined below: HPI:  73 yo male h/o NICM EF 25%, chronic pain, lymphoma in remission comes in with several days of worsening sob, orthopnea, pnd and le swelling. He has been taking his lasix 40mg  bid, has gained about 2 lbs in the last 3 or 4 days. No fevers. No coughing. Does not require oxygen at home chronically. No cp. No abd pain. No rashes. He has received lasix 40mg  iv once in the ED, has diuresed almost one liter and is already feeling better but does not like that hes "peeing like a hose". Patient was admitted and treated  with for congestive heart failure with intravenous Lasix. Serial cardiac enzymes were negative. Chest x-ray did show an improvement on the second day. There is a suggestion of pneumonia but clinically he did not have any symptoms of pneumonia, in other words he did not have any fever, productive cough, chest pain or other features. He feels back to his usual self and although the chest x-ray yesterday had not come back to normal, he does feel back to normal and his examination is improved. He'll be sent home now with an increased dose of Lasix. He will followup with his primary care physician in the next couple weeks. Discharge Exam: Blood pressure 118/83, pulse 70, temperature 98 F (36.7 C), temperature source Oral, resp. rate 21, height 5\' 7"  (1.702 m), weight 80.4 kg (177 lb 4 oz), SpO2 98.00%. He looks systemically well. Heart sounds are present in atrial fibrillation. Lung fields are now clear with a few crackles in the right mid and lower zones. Air entry is much improved. He is alert and oriented. There are no focal neurological signs.  Disposition: Home.      Discharge Orders   Future Appointments Provider Department Dept Phone   07/11/2013 2:40 PM [facility] Karalee Height [phone]   08/18/2013 10:20 AM [place] [phone]   08/18/2013 10:30 AM [facility], PA-C [facility] [phone]   Future Orders Complete By Expires   Diet - low sodium heart healthy  As directed    Increase activity slowly  As directed       Follow-up Information   Follow up with [name], MD In 2 weeks.   Specialty:  Family Medicine   Contact information:   [place] [id number] [phone]  Signed: Doree Albee   07/08/2013, 8:45 AM

## 2013-07-08 NOTE — Progress Notes (Signed)
UR chart review completed.  

## 2013-07-08 NOTE — Progress Notes (Signed)
Patient discharged to home with wife. Instructions given regarding diet, activity, follow-up appointments, and reason to seek emergency medical care. Patient able to "teach back." IV removed, site clean, dry, and intact.

## 2013-07-11 ENCOUNTER — Ambulatory Visit (INDEPENDENT_AMBULATORY_CARE_PROVIDER_SITE_OTHER): Payer: Medicare Other | Admitting: *Deleted

## 2013-07-11 ENCOUNTER — Encounter: Payer: Medicare Other | Admitting: Cardiology

## 2013-07-11 DIAGNOSIS — Z7901 Long term (current) use of anticoagulants: Secondary | ICD-10-CM

## 2013-07-11 DIAGNOSIS — Z5181 Encounter for therapeutic drug level monitoring: Secondary | ICD-10-CM

## 2013-07-11 DIAGNOSIS — I4891 Unspecified atrial fibrillation: Secondary | ICD-10-CM

## 2013-07-11 LAB — CULTURE, BLOOD (ROUTINE X 2)
CULTURE: NO GROWTH
Culture: NO GROWTH

## 2013-07-11 LAB — POCT INR: INR: 2.7

## 2013-07-22 DIAGNOSIS — S42301A Unspecified fracture of shaft of humerus, right arm, initial encounter for closed fracture: Secondary | ICD-10-CM

## 2013-07-22 HISTORY — DX: Unspecified fracture of shaft of humerus, right arm, initial encounter for closed fracture: S42.301A

## 2013-07-26 NOTE — Progress Notes (Signed)
Charles Bellow, MD [place] [address]  Follicular lymphoma - Plan: Vitamin B12, Folate, Iron and TIBC, Ferritin, Erythropoietin, IgG, IgA, IgM  Anemia, unspecified - Plan: Vitamin B12, Folate, Iron and TIBC, Ferritin, Erythropoietin  CURRENT THERAPY: Surveillance per NCCN guidelines  INTERVAL HISTORY: Charles [name] 73 y.o. 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 He is S/P Bendamustine/Rituxan finishing 6 cycles of 11/05/2010 followed by 4 maintenance Rituxan infusion stopping after the 4th infusion on 12/09/2011 secondary to complications. Last PET scan in April 2014 did not show any evidence of lymphoma.    Follicular lymphoma   08/01/2583 Imaging CT soft tissue of neck- Adenopathy more notable on right neck than left.   06/07/2010 Initial Diagnosis FNA of right neck mass concerning for lymphoma.   06/24/2010 Procedure Excisional lymph node biopsy- [id number], follicular type, grade III.   07/09/2010 Bone Marrow Biopsy Bone marrow aspiration and biopsy- rare minute atypical lymphoid aggregates, cellular marrow with complete trilineage hematopoiesis, no blasts found on peripheral blood   07/11/2010 Imaging PET- confluent right cervical lymphadenopathy with a foci within the skeleton and dominant left iliac lesions   07/17/2010 - 11/05/2010 Chemotherapy Bendamustine + Rituxan x 6 cycles   09/12/2010 Remission PET- Interval complete metabolic response to therapy    11/20/2010 Imaging PET- No evidence of residual or recurrent hypermetabolic lymphoma.    02/17/2011 - 12/09/2011 Chemotherapy Rituxan every 90 days maintenance x 4.  D/C'd due to pseudomonas bacteremia   06/06/2011 Imaging PET- No specific features identified to suggest residual or recurrence of tumor.   01/30/2012 Adverse Reaction Pseudomonas bacteremia   06/22/2012 Imaging PET- No  findings to suggest lymphomatous involvement in the neck, chest, abdomen, or pelvis.   07/06/2013 Imaging CT CAP- Stable mediastinal and hilar lymph nodes. No findings for lymphoma in the abdomen or pelvis.      Chart reviewed.  [name] was admitted to the [facility] on 3/2 and discharged on 05/25/13 with acute systolic congestive heart failure.  Additionally, he was admitted from 4/15- 07/08/2013 with acute on chronic systolic congestive heart failure.  I personally reviewed and went over laboratory results with the patient.  The results are noted within this dictation.  I personally reviewed and went over radiographic studies with the patient.  The results are noted within this dictation.  He had a CT CAP W contrast on 07/06/13 demonstrating:  Stable mediastinal and hilar lymph nodes. No findings for lymphoma  in the abdomen or pelvis.  Bilateral pleural effusions, right greater than left with overlying  atelectasis. There are also patchy ground-glass opacities and small  pulmonary nodules. This could be due to an inflammatory or atypical  infectious process/alveolitis. Lymphomatous involvement is unlikely.  Stable cardiac enlargement.  No acute abdominal/pelvic findings, mass lesions or adenopathy.  He reports that he had a reaction to one of his medications resulting in a severe rash of the dorsal aspects of hand.  He is seeing a dermatologist who has started him on an ointment.  He reports that rash is significantly improved.   His voice is hoarse and on exam I see an erythematous posterior pharynx with a post-nasal drip.  I recommended OTC allergy medication.  He denies any fevers or chills.  He denies a sore throat.   He recently saw his PCP, Dr. Anastasio Champion, and I do not have that note available  to me.  Additionally, he has seen a dermatologist and Dr. Luna Glasgow recently.   He denies any B symptoms.    Hematologically, he denies any complaints and ROS questioning is negative.    Past Medical History  Diagnosis Date  . Essential hypertension, benign   . Atrial fibrillation 10/2008  . 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 Peptic ulcer disease   . Degenerative joint disease     Knees and shoulders  . Pneumonia 2010  . Allergic rhinitis   . Anxiety   . Drug abuse   . Tobacco abuse     50 pack years  . Mitral regurgitation     Moderate to severe May 2014  . COPD (chronic obstructive pulmonary disease)   . Follicular lymphoma 03/29/1094  . Basal cell carcinoma     has TOBACCO ABUSE; INSOMNIA, CHRONIC; Essential hypertension, benign; GASTRIC ULCER, HX OF; Atrial fibrillation; Chronic anticoagulation; Follicular lymphoma; Hyperlipidemia; Calcium pyrophosphate crystal disease; Back pain; Chronic diastolic heart failure; Nonischemic cardiomyopathy; Mitral regurgitation; OA (osteoarthritis) of knee; Encounter for therapeutic drug monitoring; Acute on chronic diastolic CHF (congestive heart failure); and CHF (congestive heart failure) on his problem list.     is allergic to metoprolol and statins.  Mr. Brandis does not currently have medications on file.  Past Surgical History  Procedure Laterality Date  . Neck lesion biopsy  June 23, 452    Follicular lymphoma  . Skin cancer excision      under left breast; variously described as melanoma and basal cell  . Rotator cuff repair      right  . Patella fracture surgery Left 1975  . Hematoma evacuation      after melanoma removal  . Portacath placement  07/12/2010  . Colonoscopy  2009    Also underwent an EGD; patient reports no significant findings  . Bone marrow aspiration  06/2012  . Bone marrow biopsy  06/2012  . Fungal infection    . Port-a-cath removal Left 01/17/2013    Procedure: REMOVAL PORT-A-CATH;  Surgeon: Scherry Ran, MD;  Location: [facility];  Service: General;  Laterality: Left;    Denies any headaches, dizziness, double vision,  fevers, chills, night sweats, nausea, vomiting, diarrhea, constipation, chest pain, heart palpitations, shortness of breath, blood in stool, black tarry stool, urinary pain, urinary burning, urinary frequency, hematuria.   PHYSICAL EXAMINATION  ECOG PERFORMANCE STATUS: 2 - Symptomatic, <50% confined to bed  Filed Vitals:   07/29/13 0935  BP: 112/67  Pulse: 80  Temp: 97.4 F (36.3 C)  Resp: 20    GENERAL:alert, cachectic and smiling SKIN: Bilateral dorsal aspect of hand hypertrophic, erythematous, raised plaques that are improving per patient.  HEAD: Normocephalic, No masses, lesions, tenderness or abnormalities EYES: normal, PERRLA, EOMI, Conjunctiva are pink and non-injected EARS: External ears normal OROPHARYNX:moderate erythema and mucous membranes are moist  NECK: supple, trachea midline, right anterior cervical chain lymph nodes, 3 in total, one is at the right angle of the jaw.  They are hard and fixed.  Measuring 1 cm in size. LYMPH:  See neck BREAST:not examined LUNGS: clear to auscultation  HEART: regular rate & rhythm, no murmurs and no gallops ABDOMEN:abdomen soft and normal bowel sounds BACK: Back symmetric, no curvature. EXTREMITIES:less then 2 second capillary refill, no joint deformities, effusion, or inflammation, no skin discoloration, no clubbing, no cyanosis, positive findings:  edema B/L 1-2 pitting edema in ankles and feet. Rash on  hands described above. NEURO: alert & oriented x 3 with fluent speech, no focal motor/sensory deficits, in wheelchair    LABORATORY DATA: CBC    Component Value Date/Time   WBC 6.0 07/06/2013 1444   RBC 3.78* 07/06/2013 1444   HGB 10.7* 07/06/2013 1444   HCT 33.0* 07/06/2013 1444   PLT 237 07/06/2013 1444   MCV 87.3 07/06/2013 1444   MCH 28.3 07/06/2013 1444   MCHC 32.4 07/06/2013 1444   RDW 16.3* 07/06/2013 1444   LYMPHSABS 0.6* 05/23/2013 1340   MONOABS 0.4 05/23/2013 1340   EOSABS 0.2 05/23/2013 1340   BASOSABS 0.1 05/23/2013 1340       Chemistry      Component Value Date/Time   NA 142 07/08/2013 0453   K 3.6* 07/08/2013 0453   CL 98 07/08/2013 0453   CO2 28 07/08/2013 0453   BUN 17 07/08/2013 0453   CREATININE 1.23 07/08/2013 0453   CREATININE 1.68* 10/30/2012 1112      Component Value Date/Time   CALCIUM 9.6 07/08/2013 0453   ALKPHOS 90 07/06/2013 1444   AST 20 07/06/2013 1444   ALT 10 07/06/2013 1444   BILITOT 0.9 07/06/2013 1444     Results for VIBHAV, WADDILL (MRN [id number]) as of 07/26/2013 21:33  Ref. Range 04/21/2013 10:32  LDH Latest Range: 94-250 U/L 250     RADIOGRAPHIC STUDIES:  07/06/2013  CLINICAL DATA: Shortness of breath, weakness and diarrhea.  EXAM:  CT CHEST, ABDOMEN, AND PELVIS WITH CONTRAST  TECHNIQUE:  Multidetector CT imaging of the chest, abdomen and pelvis was  performed following the standard protocol during bolus  administration of intravenous contrast.  CONTRAST: 22mL OMNIPAQUE IOHEXOL 300 MG/ML SOLN, 152mL OMNIPAQUE  IOHEXOL 300 MG/ML SOLN  COMPARISON: CT scan 01/03/2013 and PET-CT 06/21/2012.  FINDINGS:  CT CHEST FINDINGS  The chest wall is unremarkable. No supraclavicular or axillary mass  or adenopathy. Small scattered lymph nodes are noted. A right  humeral head prosthesis is noted with moderate artifact. The bony  thorax is otherwise intact. No destructive bone lesions or spinal  canal compromise.  The heart is enlarged. No pericardial effusion. There are borderline  enlarged mediastinal and hilar lymph nodes but these appear  relatively stable. The aorta is normal in caliber. No dissection.  The esophagus is grossly normal.  There are bilateral pleural effusions, right greater than left with  fluid in the right major fissure. Patchy ground-glass opacities are  noted in lungs along with small nodules. No overt pulmonary edema.  Findings could be due to patchy inflammation/ infection/alveolitis.  Lymphoma missed involvement is possible.  CT ABDOMEN AND PELVIS FINDINGS   The liver is unremarkable. No focal hepatic lesions. No biliary  dilatation. The gallbladder is normal. No common bile duct  dilatation. The pancreas is normal. The spleen is normal in size. No  focal lesions. The adrenal glands and kidneys are unremarkable.  The stomach, duodenum, small bowel and colon are unremarkable. No  inflammatory changes, mass lesions or obstructive findings. No  mesenteric or retroperitoneal mass or adenopathy. The aorta and  branch vessels are patent. The major venous structures are patent.  The bladder, prostate gland and seminal vesicles are unremarkable.  No pelvic mass, adenopathy or free pelvic fluid collections. No  inguinal mass or adenopathy.  The bony structures are unremarkable.  IMPRESSION:  Stable mediastinal and hilar lymph nodes. No findings for lymphoma  in the abdomen or pelvis.  Bilateral pleural effusions, right greater than left with overlying  atelectasis. There are also patchy ground-glass opacities and small  pulmonary nodules. This could be due to an inflammatory or atypical  infectious process/alveolitis. Lymphomatous involvement is unlikely.  Stable cardiac enlargement.  No acute abdominal/pelvic findings, mass lesions or adenopathy.  Electronically Signed  By: Kalman Jewels M.D.  On: 07/06/2013 17:55     ASSESSMENT:  1. 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 He is S/P Bendamustine/Rituxan finishing 6 cycles of 11/05/2010 followed by 4 maintenance Rituxan infusion stopping after the 4th infusion on 12/09/2011 secondary to complications. Last PET scan in April 2014 did not show any evidence of lymphoma. Now with 3 right anterior cervical lymph nodes measuring less than 1.5 cm in size.  One is at the right angle of the jaw. 2. Systolic congestive heart failure, on therapy and followed by cardiology 3. Reaction of medication resulting in rash on dorsal aspect  of hands, followed by Dermatology. 4. Hoarse voice, secondary to post-nasal drip.  Patient Active Problem List   Diagnosis Date Noted  . Acute on chronic diastolic CHF (congestive heart failure) 07/06/2013  . CHF (congestive heart failure) 07/06/2013  . Encounter for therapeutic drug monitoring 04/25/2013  . OA (osteoarthritis) of knee 12/30/2012  . Mitral regurgitation 10/27/2012  . Chronic diastolic heart failure 54/00/8676  . Calcium pyrophosphate crystal disease 09/07/2012  . Back pain 09/07/2012  . Hyperlipidemia 04/17/2011  . Follicular lymphoma 19/50/9326  . Atrial fibrillation 06/21/2010  . Chronic anticoagulation 06/21/2010  . Nonischemic cardiomyopathy 02/21/2009  . TOBACCO ABUSE 11/22/2008  . INSOMNIA, CHRONIC 11/22/2008  . GASTRIC ULCER, HX OF 11/22/2008  . Essential hypertension, benign 11/08/2008      PLAN:  1. I personally reviewed and went over laboratory results with the patient.  The results are noted within this dictation. 2. I personally reviewed and went over radiographic studies with the patient.  The results are noted within this dictation.   3. Chart reviewed 4. Labs today: CBC diff, CMET, LDH, ESR, B2M, Anemia panel, erythropoietin level, immunoglobulins 5. Recommend OTC allergy medication (ie Allegra, Claritin, Zyrtec) and/or flonase 6. Follow-up with cardiology, PCP, dermatology, and ortho as directed 7. Labs in 4 months: CBC diff, CMET LDH, B2M, ESR 8. Return in 4 months for follow-up   THERAPY PLAN:  There is not an indication for systemic treatment at this time.  We will continue to monitor closely.  All questions were answered. The patient knows to call the clinic with any problems, questions or concerns. We can certainly see the patient much sooner if necessary.  Patient and plan discussed with Dr. Farrel Gobble and he is in agreement with the aforementioned.   [facility] 07/29/2013

## 2013-07-27 ENCOUNTER — Ambulatory Visit (HOSPITAL_COMMUNITY): Payer: Medicare Other | Admitting: Oncology

## 2013-07-27 ENCOUNTER — Other Ambulatory Visit (HOSPITAL_COMMUNITY): Payer: Medicare Other

## 2013-07-29 ENCOUNTER — Encounter (HOSPITAL_COMMUNITY): Payer: Medicare Other | Attending: Oncology | Admitting: Oncology

## 2013-07-29 ENCOUNTER — Encounter (HOSPITAL_COMMUNITY): Payer: Medicare Other

## 2013-07-29 VITALS — BP 112/67 | HR 80 | Temp 97.4°F | Resp 20 | Wt 180.4 lb

## 2013-07-29 DIAGNOSIS — C8290 Follicular lymphoma, unspecified, unspecified site: Secondary | ICD-10-CM

## 2013-07-29 DIAGNOSIS — I5022 Chronic systolic (congestive) heart failure: Secondary | ICD-10-CM | POA: Insufficient documentation

## 2013-07-29 DIAGNOSIS — M19019 Primary osteoarthritis, unspecified shoulder: Secondary | ICD-10-CM | POA: Insufficient documentation

## 2013-07-29 DIAGNOSIS — C8299 Follicular lymphoma, unspecified, extranodal and solid organ sites: Secondary | ICD-10-CM | POA: Insufficient documentation

## 2013-07-29 DIAGNOSIS — J309 Allergic rhinitis, unspecified: Secondary | ICD-10-CM | POA: Insufficient documentation

## 2013-07-29 DIAGNOSIS — F17200 Nicotine dependence, unspecified, uncomplicated: Secondary | ICD-10-CM | POA: Insufficient documentation

## 2013-07-29 DIAGNOSIS — J449 Chronic obstructive pulmonary disease, unspecified: Secondary | ICD-10-CM | POA: Insufficient documentation

## 2013-07-29 DIAGNOSIS — D649 Anemia, unspecified: Secondary | ICD-10-CM

## 2013-07-29 DIAGNOSIS — M1710 Unilateral primary osteoarthritis, unspecified knee: Secondary | ICD-10-CM | POA: Insufficient documentation

## 2013-07-29 DIAGNOSIS — I10 Essential (primary) hypertension: Secondary | ICD-10-CM | POA: Insufficient documentation

## 2013-07-29 DIAGNOSIS — J4489 Other specified chronic obstructive pulmonary disease: Secondary | ICD-10-CM | POA: Insufficient documentation

## 2013-07-29 DIAGNOSIS — Z85820 Personal history of malignant melanoma of skin: Secondary | ICD-10-CM | POA: Insufficient documentation

## 2013-07-29 DIAGNOSIS — I059 Rheumatic mitral valve disease, unspecified: Secondary | ICD-10-CM | POA: Insufficient documentation

## 2013-07-29 DIAGNOSIS — I509 Heart failure, unspecified: Secondary | ICD-10-CM | POA: Insufficient documentation

## 2013-07-29 DIAGNOSIS — I428 Other cardiomyopathies: Secondary | ICD-10-CM | POA: Insufficient documentation

## 2013-07-29 DIAGNOSIS — R0982 Postnasal drip: Secondary | ICD-10-CM | POA: Insufficient documentation

## 2013-07-29 DIAGNOSIS — F411 Generalized anxiety disorder: Secondary | ICD-10-CM | POA: Insufficient documentation

## 2013-07-29 LAB — COMPREHENSIVE METABOLIC PANEL
ALBUMIN: 4 g/dL (ref 3.5–5.2)
ALT: 9 U/L (ref 0–53)
AST: 18 U/L (ref 0–37)
Alkaline Phosphatase: 92 U/L (ref 39–117)
BUN: 19 mg/dL (ref 6–23)
CO2: 28 mEq/L (ref 19–32)
CREATININE: 1.29 mg/dL (ref 0.50–1.35)
Calcium: 9.5 mg/dL (ref 8.4–10.5)
Chloride: 95 mEq/L — ABNORMAL LOW (ref 96–112)
GFR calc Af Amer: 61 mL/min — ABNORMAL LOW (ref >90)
GFR calc non Af Amer: 53 mL/min — ABNORMAL LOW (ref >90)
Glucose, Bld: 142 mg/dL — ABNORMAL HIGH (ref 70–99)
POTASSIUM: 3.8 meq/L (ref 3.7–5.3)
Sodium: 138 mEq/L (ref 137–147)
Total Bilirubin: 0.7 mg/dL (ref 0.3–1.2)
Total Protein: 7.7 g/dL (ref 6.0–8.3)

## 2013-07-29 LAB — CBC WITH DIFFERENTIAL/PLATELET
BASOS ABS: 0 10*3/uL (ref 0.0–0.1)
BASOS PCT: 0 % (ref 0–1)
Eosinophils Absolute: 0 10*3/uL (ref 0.0–0.7)
Eosinophils Relative: 0 % (ref 0–5)
HCT: 33.1 % — ABNORMAL LOW (ref 39.0–52.0)
Hemoglobin: 10.6 g/dL — ABNORMAL LOW (ref 13.0–17.0)
Lymphocytes Relative: 5 % — ABNORMAL LOW (ref 12–46)
Lymphs Abs: 0.3 10*3/uL — ABNORMAL LOW (ref 0.7–4.0)
MCH: 27.5 pg (ref 26.0–34.0)
MCHC: 32 g/dL (ref 30.0–36.0)
MCV: 85.8 fL (ref 78.0–100.0)
MONO ABS: 0.2 10*3/uL (ref 0.1–1.0)
Monocytes Relative: 3 % (ref 3–12)
NEUTROS ABS: 4.8 10*3/uL (ref 1.7–7.7)
NEUTROS PCT: 92 % — AB (ref 43–77)
Platelets: 270 10*3/uL (ref 150–400)
RBC: 3.86 MIL/uL — ABNORMAL LOW (ref 4.22–5.81)
RDW: 17 % — ABNORMAL HIGH (ref 11.5–15.5)
WBC: 5.2 10*3/uL (ref 4.0–10.5)

## 2013-07-29 LAB — SEDIMENTATION RATE: SED RATE: 30 mm/h — AB (ref 0–16)

## 2013-07-29 LAB — VITAMIN B12: Vitamin B-12: 662 pg/mL (ref 211–911)

## 2013-07-29 LAB — FOLATE: Folate: >20 ng/mL

## 2013-07-29 LAB — LACTATE DEHYDROGENASE: LDH: 296 U/L — AB (ref 94–250)

## 2013-07-29 NOTE — Patient Instructions (Signed)
[facility] Discharge Instructions  RECOMMENDATIONS MADE BY THE CONSULTANT AND ANY TEST RESULTS WILL BE SENT TO YOUR REFERRING PHYSICIAN.  EXAM FINDINGS BY THE PHYSICIAN TODAY AND SIGNS OR SYMPTOMS TO REPORT TO CLINIC OR PRIMARY PHYSICIAN: Exam and findings as discussed by Robynn Pane, PA-C.  Will be checking some additional labs and will let you know if there's anything of concern.  MEDICATIONS PRESCRIBED:  Claritin and or Flonase for you postnasal drainage.  INSTRUCTIONS/FOLLOW-UP: Follow-up in 4 months with labs and office visit.  Thank you for choosing [facility] to provide your oncology and hematology care.  To afford each patient quality time with our providers, please arrive at least 15 minutes before your scheduled appointment time.  With your help, our goal is to use those 15 minutes to complete the necessary work-up to ensure our physicians have the information they need to help with your evaluation and healthcare recommendations.    Effective January 1st, 2014, we ask that you re-schedule your appointment with our physicians should you arrive 10 or more minutes late for your appointment.  We strive to give you quality time with our providers, and arriving late affects you and other patients whose appointments are after yours.    Again, thank you for choosing [facility].  Our hope is that these requests will decrease the amount of time that you wait before being seen by our physicians.       _____________________________________________________________  Should you have questions after your visit to [facility], please contact our office at [phone] between the hours of 8:30 a.m. and 5:00 p.m.  Voicemails left after 4:30 p.m. will not be returned until the following business day.  For prescription refill requests, have your pharmacy contact our office with your prescription refill request.

## 2013-07-29 NOTE — Progress Notes (Signed)
Charles [name] presented for labwork. Labs per MD order drawn via Peripheral Line 23 gauge needle inserted in left hand  Good blood return present. Procedure without incident.  Needle removed intact. Patient tolerated procedure well.

## 2013-07-30 LAB — IGG, IGA, IGM
IgA: 391 mg/dL — ABNORMAL HIGH (ref 68–379)
IgG (Immunoglobin G), Serum: 883 mg/dL (ref 650–1600)
IgM, Serum: 94 mg/dL (ref 41–251)

## 2013-07-30 LAB — IRON AND TIBC
Iron: 28 ug/dL — ABNORMAL LOW (ref 42–135)
SATURATION RATIOS: 6 % — AB (ref 20–55)
TIBC: 444 ug/dL — AB (ref 215–435)
UIBC: 416 ug/dL — ABNORMAL HIGH (ref 125–400)

## 2013-08-01 ENCOUNTER — Ambulatory Visit (INDEPENDENT_AMBULATORY_CARE_PROVIDER_SITE_OTHER): Payer: Medicare Other | Admitting: *Deleted

## 2013-08-01 DIAGNOSIS — Z5181 Encounter for therapeutic drug level monitoring: Secondary | ICD-10-CM

## 2013-08-01 DIAGNOSIS — Z7901 Long term (current) use of anticoagulants: Secondary | ICD-10-CM

## 2013-08-01 DIAGNOSIS — I4891 Unspecified atrial fibrillation: Secondary | ICD-10-CM

## 2013-08-01 LAB — BETA 2 MICROGLOBULIN, SERUM: Beta-2 Microglobulin: 4.53 mg/L — ABNORMAL HIGH (ref <=2.51)

## 2013-08-01 LAB — POCT INR: INR: 3.7

## 2013-08-02 ENCOUNTER — Other Ambulatory Visit (HOSPITAL_COMMUNITY): Payer: Self-pay | Admitting: Oncology

## 2013-08-02 DIAGNOSIS — E611 Iron deficiency: Secondary | ICD-10-CM

## 2013-08-03 LAB — ERYTHROPOIETIN: Erythropoietin: 37.1 m[IU]/mL — ABNORMAL HIGH (ref 2.6–18.5)

## 2013-08-04 ENCOUNTER — Encounter (HOSPITAL_BASED_OUTPATIENT_CLINIC_OR_DEPARTMENT_OTHER): Payer: Medicare Other

## 2013-08-04 VITALS — BP 120/66 | HR 72 | Temp 97.5°F | Resp 18

## 2013-08-04 DIAGNOSIS — D649 Anemia, unspecified: Secondary | ICD-10-CM

## 2013-08-04 DIAGNOSIS — E611 Iron deficiency: Secondary | ICD-10-CM

## 2013-08-04 MED ORDER — SODIUM CHLORIDE 0.9 % IV SOLN
INTRAVENOUS | Status: DC
  Administered 2013-08-04: 12:00:00 via INTRAVENOUS

## 2013-08-04 MED ORDER — SODIUM CHLORIDE 0.9 % IJ SOLN
10.0000 mL | INTRAMUSCULAR | Status: DC | PRN
  Administered 2013-08-04: 10 mL via INTRAVENOUS

## 2013-08-04 MED ORDER — SODIUM CHLORIDE 0.9 % IV SOLN
1020.0000 mg | Freq: Once | INTRAVENOUS | Status: AC
  Administered 2013-08-04: 1020 mg via INTRAVENOUS
  Filled 2013-08-04: qty 34

## 2013-08-04 NOTE — Progress Notes (Signed)
Tolerated well

## 2013-08-05 ENCOUNTER — Encounter: Payer: Self-pay | Admitting: Cardiology

## 2013-08-05 ENCOUNTER — Ambulatory Visit (INDEPENDENT_AMBULATORY_CARE_PROVIDER_SITE_OTHER): Payer: Medicare Other | Admitting: Cardiology

## 2013-08-05 VITALS — BP 111/79 | HR 92 | Ht 64.0 in | Wt 179.0 lb

## 2013-08-05 DIAGNOSIS — I340 Nonrheumatic mitral (valve) insufficiency: Secondary | ICD-10-CM

## 2013-08-05 DIAGNOSIS — I059 Rheumatic mitral valve disease, unspecified: Secondary | ICD-10-CM

## 2013-08-05 DIAGNOSIS — I10 Essential (primary) hypertension: Secondary | ICD-10-CM

## 2013-08-05 DIAGNOSIS — I4891 Unspecified atrial fibrillation: Secondary | ICD-10-CM

## 2013-08-05 DIAGNOSIS — I428 Other cardiomyopathies: Secondary | ICD-10-CM

## 2013-08-05 NOTE — Assessment & Plan Note (Signed)
Moderate by most recent assessment.

## 2013-08-05 NOTE — Patient Instructions (Addendum)
Your physician recommends that you schedule a follow-up appointment in: 1 month    Your physician recommends that you continue on your current medications as directed. Please refer to the Current Medication list given to you today.    Please get lab work just before next visit  (BMET)   Thank you for choosing [facility] !

## 2013-08-05 NOTE — Progress Notes (Signed)
Clinical Summary Mr. Paino is a medically complex 73 y.o.male most recently seen in the office by Ms. Lawrence NP in March. Medicines have been modified significantly since saw him last. He did not tolerate beta blocker use, felt worse and also had wheezing. He was placed on diltiazem for heart rate control of atrial fibrillation. He has also been hospitalized with significant volume overload. He is here today with his wife, much improved. He has lost a significant amount of weight and feels better in terms of his breathing and edema. Also more energy.  Followup echocardiogram in March of this year demonstrated mild LVH with LVEF 35-40%, inferolateral hypokinesis, grade 2 diastolic dysfunction, severe left atrial enlargement, MAC with thickened mitral leaflets and moderate mitral regurgitation, sclerotic aortic valve, trivial tricuspid regurgitation, unable to assess PASP, CVP elevated. He has not had ischemic followup testing as yet.  Recent lab work reviewed finding potassium 3.8, BUN 19, creatinine 1.2, hemoglobin 10.6, platelets 270.   Allergies  Allergen Reactions  . Metoprolol Shortness Of Breath    Intolerant to all BB  . Statins     Intolerant secondary to severe myalgias    Current Outpatient Prescriptions  Medication Sig Dispense Refill  . ALPRAZolam (XANAX) 1 MG tablet Take 0.5 mg by mouth 3 (three) times daily.       . calcium citrate (CALCITRATE - DOSED IN MG ELEMENTAL CALCIUM) 950 MG tablet Take 1 tablet by mouth 2 (two) times daily.      . cholecalciferol (VITAMIN D) 1000 UNITS tablet Take 1,000 Units by mouth every morning.      . digoxin (LANOXIN) 0.125 MG tablet Take 0.125 mg by mouth every morning.      . diltiazem (CARDIZEM CD) 180 MG 24 hr capsule Take 1 capsule (180 mg total) by mouth daily.  90 capsule  3  . docusate sodium (COLACE) 50 MG capsule Take 50 mg by mouth 2 (two) times daily.      . furosemide (LASIX) 40 MG tablet Take 80 mg by mouth 2 (two) times  daily. *Patient may take an additional tablet if needed*      . HYDROcodone-acetaminophen (NORCO) 10-325 MG per tablet Take 1 tablet by mouth every 6 (six) hours as needed for moderate pain.      Marland Kitchen lovastatin (MEVACOR) 10 MG tablet Take 10 mg by mouth at bedtime.       Marland Kitchen omeprazole (PRILOSEC) 20 MG capsule Take 20 mg by mouth every morning.       . polyethylene glycol (MIRALAX / GLYCOLAX) packet Take 17 g by mouth daily as needed.      . potassium chloride SA (K-DUR,KLOR-CON) 20 MEQ tablet Take 2 tablets (40 mEq total) by mouth daily.  60 tablet  0  . PROAIR HFA 108 (90 BASE) MCG/ACT inhaler Inhale 1 puff into the lungs every 6 (six) hours as needed. For shortness of breath      . sertraline (ZOLOFT) 50 MG tablet Take 50 mg by mouth at bedtime.      . Simethicone (PHAZYME PO) Take 1 capsule by mouth daily as needed (for gas relief/stomach symptoms).      . warfarin (COUMADIN) 5 MG tablet Take 2.5-5 mg by mouth daily. Take 1/2 tablet (2.5mg ) on Mon, Wed, Fri, Sun and 1 tab (5mg ) on Tues, Thurs, Sat.       No current facility-administered medications for this visit.   Facility-Administered Medications Ordered in Other Visits  Medication Dose Route Frequency Provider  Last Rate Last Dose  . sodium chloride 0.9 % injection 10 mL  10 mL Intracatheter PRN Pieter Partridge, MD   10 mL at 10/15/10 1230    Past Medical History  Diagnosis Date  . Essential hypertension, benign   . Atrial fibrillation 10/2008  . 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 Peptic ulcer disease   . Degenerative joint disease     Knees and shoulders  . Pneumonia 2010  . Allergic rhinitis   . Anxiety   . Drug abuse   . Tobacco abuse     50 pack years  . Mitral regurgitation     Moderate to severe May 2014  . COPD (chronic obstructive pulmonary disease)   . Follicular lymphoma 05/02/5186  . Basal cell carcinoma     Social History Mr. Tashiro reports that he  quit smoking about 15 months ago. His smoking use included Cigarettes. He has a 50 pack-year smoking history. He has never used smokeless tobacco. Mr. Meditz reports that he does not drink alcohol.  Review of Systems Improving rash on his hands. Orthopnea resolved. Otherwise as outlined.  Physical Examination Filed Vitals:   08/05/13 1422  BP: 111/79  Pulse: 92   Filed Weights   08/05/13 1422  Weight: 179 lb (81.194 kg)    Chronically ill-appearing male in no distress.  HEENT: Conjunctiva and lids normal, oropharynx clear.  Neck: Supple, no elevated JVP or carotid bruits, no thyromegaly.  Lungs: Clear to auscultation, decreased in bases, nonlabored breathing at rest.  Cardiac: Irregularly irregular, no S3, apical systolic murmur, no pericardial rub.  Abdomen: Soft, nontender, bowel sounds present, no guarding or rebound.  Extremities: Trace ankle edema, distal pulses 2+.  Skin: Warm and dry.  Musculoskeletal: No kyphosis.  Neuropsychiatric: Alert and oriented x3, flat affect .   Problem List and Plan   Atrial fibrillation At this point back on diltiazem CD 180 mg daily along with Coumadin. He did not tolerate beta blocker therapy. He may need an increase in dose up to 240 mg daily depending on how well his heart rate is controlled. Symptomatically he feels much better.  Nonischemic cardiomyopathy LVEF approximately 35-40% by most recent evaluation. He did not tolerate beta blocker therapy. Goal will be control of heart rate in atrial fibrillation as well as blood pressure control. His volume status is much improved on fairly high-dose Lasix. Will plan to see him back in a month, followup BMET at that time.  Mitral regurgitation Moderate by most recent assessment.    Satira Sark, M.D., F.A.C.C.

## 2013-08-05 NOTE — Assessment & Plan Note (Signed)
At this point back on diltiazem CD 180 mg daily along with Coumadin. He did not tolerate beta blocker therapy. He may need an increase in dose up to 240 mg daily depending on how well his heart rate is controlled. Symptomatically he feels much better.

## 2013-08-05 NOTE — Assessment & Plan Note (Signed)
LVEF approximately 35-40% by most recent evaluation. He did not tolerate beta blocker therapy. Goal will be control of heart rate in atrial fibrillation as well as blood pressure control. His volume status is much improved on fairly high-dose Lasix. Will plan to see him back in a month, followup BMET at that time.

## 2013-08-06 ENCOUNTER — Emergency Department (HOSPITAL_COMMUNITY): Payer: Medicare Other

## 2013-08-06 ENCOUNTER — Emergency Department (HOSPITAL_COMMUNITY)
Admission: EM | Admit: 2013-08-06 | Discharge: 2013-08-06 | Disposition: A | Discharge status: Home / Self Care | Payer: Medicare Other | Attending: Emergency Medicine | Admitting: Emergency Medicine

## 2013-08-06 ENCOUNTER — Encounter (HOSPITAL_COMMUNITY): Payer: Self-pay | Admitting: Emergency Medicine

## 2013-08-06 DIAGNOSIS — Z8711 Personal history of peptic ulcer disease: Secondary | ICD-10-CM | POA: Insufficient documentation

## 2013-08-06 DIAGNOSIS — J4489 Other specified chronic obstructive pulmonary disease: Secondary | ICD-10-CM | POA: Insufficient documentation

## 2013-08-06 DIAGNOSIS — I10 Essential (primary) hypertension: Secondary | ICD-10-CM | POA: Insufficient documentation

## 2013-08-06 DIAGNOSIS — Z7901 Long term (current) use of anticoagulants: Secondary | ICD-10-CM | POA: Insufficient documentation

## 2013-08-06 DIAGNOSIS — Z862 Personal history of diseases of the blood and blood-forming organs and certain disorders involving the immune mechanism: Secondary | ICD-10-CM | POA: Insufficient documentation

## 2013-08-06 DIAGNOSIS — F411 Generalized anxiety disorder: Secondary | ICD-10-CM | POA: Insufficient documentation

## 2013-08-06 DIAGNOSIS — Z79899 Other long term (current) drug therapy: Secondary | ICD-10-CM | POA: Insufficient documentation

## 2013-08-06 DIAGNOSIS — Z8701 Personal history of pneumonia (recurrent): Secondary | ICD-10-CM | POA: Insufficient documentation

## 2013-08-06 DIAGNOSIS — Z85828 Personal history of other malignant neoplasm of skin: Secondary | ICD-10-CM | POA: Insufficient documentation

## 2013-08-06 DIAGNOSIS — Z87891 Personal history of nicotine dependence: Secondary | ICD-10-CM | POA: Insufficient documentation

## 2013-08-06 DIAGNOSIS — W06XXXA Fall from bed, initial encounter: Secondary | ICD-10-CM | POA: Insufficient documentation

## 2013-08-06 DIAGNOSIS — J449 Chronic obstructive pulmonary disease, unspecified: Secondary | ICD-10-CM | POA: Insufficient documentation

## 2013-08-06 DIAGNOSIS — Y929 Unspecified place or not applicable: Secondary | ICD-10-CM | POA: Insufficient documentation

## 2013-08-06 DIAGNOSIS — Y9389 Activity, other specified: Secondary | ICD-10-CM | POA: Insufficient documentation

## 2013-08-06 DIAGNOSIS — S0990XA Unspecified injury of head, initial encounter: Secondary | ICD-10-CM | POA: Insufficient documentation

## 2013-08-06 DIAGNOSIS — S52599A Other fractures of lower end of unspecified radius, initial encounter for closed fracture: Secondary | ICD-10-CM | POA: Insufficient documentation

## 2013-08-06 DIAGNOSIS — I4891 Unspecified atrial fibrillation: Secondary | ICD-10-CM | POA: Insufficient documentation

## 2013-08-06 DIAGNOSIS — Z8739 Personal history of other diseases of the musculoskeletal system and connective tissue: Secondary | ICD-10-CM | POA: Insufficient documentation

## 2013-08-06 DIAGNOSIS — S52509A Unspecified fracture of the lower end of unspecified radius, initial encounter for closed fracture: Secondary | ICD-10-CM

## 2013-08-06 DIAGNOSIS — Z9889 Other specified postprocedural states: Secondary | ICD-10-CM | POA: Insufficient documentation

## 2013-08-06 DIAGNOSIS — W1809XA Striking against other object with subsequent fall, initial encounter: Secondary | ICD-10-CM | POA: Insufficient documentation

## 2013-08-06 LAB — CBC WITH DIFFERENTIAL/PLATELET
Basophils Absolute: 0.1 10*3/uL (ref 0.0–0.1)
Basophils Relative: 2 % — ABNORMAL HIGH (ref 0–1)
EOS ABS: 0.2 10*3/uL (ref 0.0–0.7)
EOS PCT: 4 % (ref 0–5)
HEMATOCRIT: 34.4 % — AB (ref 39.0–52.0)
Hemoglobin: 10.8 g/dL — ABNORMAL LOW (ref 13.0–17.0)
LYMPHS ABS: 0.5 10*3/uL — AB (ref 0.7–4.0)
LYMPHS PCT: 9 % — AB (ref 12–46)
MCH: 26.7 pg (ref 26.0–34.0)
MCHC: 31.4 g/dL (ref 30.0–36.0)
MCV: 85.1 fL (ref 78.0–100.0)
MONOS PCT: 9 % (ref 3–12)
Monocytes Absolute: 0.5 10*3/uL (ref 0.1–1.0)
Neutro Abs: 4 10*3/uL (ref 1.7–7.7)
Neutrophils Relative %: 76 % (ref 43–77)
PLATELETS: 227 10*3/uL (ref 150–400)
RBC: 4.04 MIL/uL — AB (ref 4.22–5.81)
RDW: 16.2 % — ABNORMAL HIGH (ref 11.5–15.5)
WBC: 5.3 10*3/uL (ref 4.0–10.5)

## 2013-08-06 LAB — BASIC METABOLIC PANEL
BUN: 23 mg/dL (ref 6–23)
CO2: 33 meq/L — AB (ref 19–32)
Calcium: 9.5 mg/dL (ref 8.4–10.5)
Chloride: 98 mEq/L (ref 96–112)
Creatinine, Ser: 1.14 mg/dL (ref 0.50–1.35)
GFR calc Af Amer: 71 mL/min — ABNORMAL LOW (ref >90)
GFR, EST NON AFRICAN AMERICAN: 61 mL/min — AB (ref >90)
GLUCOSE: 117 mg/dL — AB (ref 70–99)
Potassium: 3.8 mEq/L (ref 3.7–5.3)
SODIUM: 142 meq/L (ref 137–147)

## 2013-08-06 LAB — TROPONIN I: Troponin I: <0.3 ng/mL (ref <0.30)

## 2013-08-06 LAB — PROTIME-INR
INR: 2.33 — ABNORMAL HIGH (ref 0.00–1.49)
PROTHROMBIN TIME: 24.8 s — AB (ref 11.6–15.2)

## 2013-08-06 MED ORDER — HYDROCODONE-ACETAMINOPHEN 5-325 MG PO TABS: 2.0000 | ORAL_TABLET | ORAL | Status: DC | PRN

## 2013-08-06 NOTE — ED Notes (Signed)
Pt states that he doesn't need anything at this time. Charles [name]

## 2013-08-06 NOTE — ED Provider Notes (Signed)
CSN: [id number]     Arrival date & time 08/06/13  1548 History  This chart was scribed for Ezequiel Essex, MD by Roxan Diesel, ED scribe.  This patient was seen in room [facility] and the patient's care was started at 4:46 PM.   Chief Complaint  Patient presents with  . Fall  . Wrist Pain    The history is provided by the patient. No language interpreter was used.    HPI Comments: Charles [name] is a 73 y.o. male with h/o A-fib on chronic Coumadin, nonischemic cardiomyopathy, COPD, mitral regurgitation, follicular lymphoma, PUD, and degenerative joint disease who presents to the Emergency Department complaining of a all that occurred pta.  Pt reports he was making his bed when he slid and fell hitting his head, right wrist, and right arm on the carpeted floor.  He states he knew he was falling and was trying to catch himself on his outstretched right hand.  He denies LOC and is not amnesic to the event.  He denies preceding dizziness or lightheadedness.  He now presents with abrasions to his forehead and right arm with mild-to-moderate associated pain to those areas.  He denies blurred vision, dizziness, lightheadedness, CP, abdominal pain, hip pain or neck pain.  Pt's daughter denies behavioral changes.  Pt walks with a walker at baseline.  Daughter notes that he has been generally weak recently.  He received an iron infusion from his oncologist 2 days ago after labwork at a f/u appointment revealed anemia.  He has h/o follicular lymphoma but has finished his chemotherapy and is not receiving treatment currently. Tetanus UTD.   Past Medical History  Diagnosis Date  . Essential hypertension, benign   . Atrial fibrillation 10/2008  . 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 Peptic ulcer disease   . Degenerative joint disease     Knees and shoulders  . Pneumonia 2010  . Allergic rhinitis   . Anxiety   . Drug abuse   .  Tobacco abuse     50 pack years  . Mitral regurgitation     Moderate to severe May 2014  . COPD (chronic obstructive pulmonary disease)   . 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 Basal cell carcinoma     Past Surgical History  Procedure Laterality Date  . Neck lesion biopsy  June 24, 7414    Follicular lymphoma  . Skin cancer excision      under left breast; variously described as melanoma and basal cell  . Rotator cuff repair      right  . Patella fracture surgery Left 1975  . Hematoma evacuation      after melanoma removal  . Portacath placement  07/12/2010  . Colonoscopy  2009    Also underwent an EGD; patient reports no significant findings  . Bone marrow aspiration  06/2012  . Bone marrow biopsy  06/2012  . Fungal infection    . Port-a-cath removal Left 01/17/2013    Procedure: REMOVAL PORT-A-CATH;  Surgeon: Scherry Ran, MD;  Location: [facility];  Service: General;  Laterality: Left;    Family History  Problem Relation Age of Onset  . Heart attack Father   . Coronary artery disease Other     History  Substance Use Topics  . Smoking status: Former Smoker -- 1.00 packs/day for 50 years    Types: Cigarettes    Quit date: 04/12/2012  . Smokeless tobacco: Never Used  .  Alcohol Use: No     Review of Systems A complete 10 system review of systems was obtained and all systems are negative except as noted in the HPI and PMH.     Allergies  Metoprolol and Statins  Home Medications   Prior to Admission medications   Medication Sig Start Date End Date Taking? Authorizing Provider  ALPRAZolam Duanne Moron) 1 MG tablet Take 0.5 mg by mouth 3 (three) times daily.     Historical Provider, MD  calcium citrate (CALCITRATE - DOSED IN MG ELEMENTAL CALCIUM) 950 MG tablet Take 1 tablet by mouth 2 (two) times daily.    Historical Provider, MD  cholecalciferol (VITAMIN D) 1000 UNITS tablet Take 1,000 Units by mouth every morning.    Historical Provider, MD  digoxin (LANOXIN) 0.125 MG  tablet Take 0.125 mg by mouth every morning.    Historical Provider, MD  diltiazem (CARDIZEM CD) 180 MG 24 hr capsule Take 1 capsule (180 mg total) by mouth daily. 06/08/13   Lendon Colonel, NP  docusate sodium (COLACE) 50 MG capsule Take 50 mg by mouth 2 (two) times daily.    Historical Provider, MD  furosemide (LASIX) 40 MG tablet Take 80 mg by mouth 2 (two) times daily. *Patient may take an additional tablet if needed* 07/08/13   Nimish Luther Parody, MD  HYDROcodone-acetaminophen (NORCO) 10-325 MG per tablet Take 1 tablet by mouth every 6 (six) hours as needed for moderate pain.    Historical Provider, MD  lovastatin (MEVACOR) 10 MG tablet Take 10 mg by mouth at bedtime.     Historical Provider, MD  omeprazole (PRILOSEC) 20 MG capsule Take 20 mg by mouth every morning.     Historical Provider, MD  polyethylene glycol (MIRALAX / GLYCOLAX) packet Take 17 g by mouth daily as needed.    Historical Provider, MD  potassium chloride SA (K-DUR,KLOR-CON) 20 MEQ tablet Take 2 tablets (40 mEq total) by mouth daily. 07/08/13   Nimish Luther Parody, MD  PROAIR HFA 108 (90 BASE) MCG/ACT inhaler Inhale 1 puff into the lungs every 6 (six) hours as needed. For shortness of breath 01/15/12   Historical Provider, MD  sertraline (ZOLOFT) 50 MG tablet Take 50 mg by mouth at bedtime. 08/13/12   Angus Ailene Ravel, MD  Simethicone (PHAZYME PO) Take 1 capsule by mouth daily as needed (for gas relief/stomach symptoms).    Historical Provider, MD  warfarin (COUMADIN) 5 MG tablet Take 2.5-5 mg by mouth daily. Take 1/2 tablet (2.$RemoveBefor'5mg'qAElOxyGkcHh$ ) on Mon, Wed, Fri, Sun and 1 tab ($Remo'5mg'oFYHf$ ) on Tues, Thurs, Sat.    Historical Provider, MD   BP 118/65  Pulse 74  Temp(Src) 97.7 F (36.5 C) (Oral)  Resp 20  Ht $R'5\' 7"'HV$  (1.702 m)  Wt 179 lb (81.194 kg)  BMI 28.03 kg/m2  SpO2 98%  Physical Exam  Nursing note and vitals reviewed. Constitutional: He is oriented to person, place, and time. He appears well-developed and well-nourished. No distress.  HENT:   Head: Normocephalic.  Mouth/Throat: Oropharynx is clear and moist. No oropharyngeal exudate.  Abrasion/hematoma to right forehead.  Eyes: Conjunctivae and EOM are normal. Pupils are equal, round, and reactive to light.  Neck: Normal range of motion. Neck supple. No tracheal deviation present.  Cardiovascular: Normal rate and normal heart sounds.   Intact radial pulse. Irregular rhythm  Pulmonary/Chest: Effort normal. No respiratory distress. He exhibits no tenderness.  Abdominal: Soft. There is no tenderness.  Musculoskeletal: Normal range of motion. He exhibits no edema and  no tenderness.  Skin tear to right lateral elbow.   Swelling and tenderness to right dorsal wrist.  No C-spine tenderness.  Neurological: He is alert and oriented to person, place, and time. No cranial nerve deficit. He exhibits normal muscle tone. Coordination normal.  CN 2-12 intact, no ataxia on finger to nose, no nystagmus, 5/5 strength throughout, no pronator drift.  Skin: Skin is warm and dry.  Psychiatric: He has a normal mood and affect. His behavior is normal.    ED Course  Procedures (including critical care time)  DIAGNOSTIC STUDIES: Oxygen Saturation is 98% on room air, normal by my interpretation.    COORDINATION OF CARE: 4:54 PM-Discussed treatment plan which includes imaging, EKG and labs with pt at bedside and pt agreed to plan.     Labs Review Labs Reviewed  PROTIME-INR - Abnormal; Notable for the following:    Prothrombin Time 24.8 (*)    INR 2.33 (*)    All other components within normal limits  CBC WITH DIFFERENTIAL - Abnormal; Notable for the following:    RBC 4.04 (*)    Hemoglobin 10.8 (*)    HCT 34.4 (*)    RDW 16.2 (*)    Lymphocytes Relative 9 (*)    Lymphs Abs 0.5 (*)    Basophils Relative 2 (*)    All other components within normal limits  BASIC METABOLIC PANEL - Abnormal; Notable for the following:    CO2 33 (*)    Glucose, Bld 117 (*)    GFR calc non Af Amer 61 (*)     GFR calc Af Amer 71 (*)    All other components within normal limits  TROPONIN I    Imaging Review Dg Chest 1 View  08/06/2013   CLINICAL DATA:  Fall  EXAM: CHEST - 1 VIEW  COMPARISON:  DG CHEST 1 VIEW dated 07/07/2013; CT CHEST W/CM dated 07/06/2013  FINDINGS: The heart size remains moderately enlarged without evidence for edema. Both lungs are clear. The visualized skeletal structures are unremarkable. Right shoulder arthroplasty partly visualized.  IMPRESSION: Moderate cardiomegaly without acute finding.   Electronically Signed   By: Conchita Paris M.D.   On: 08/06/2013 18:39   Dg Shoulder Right  08/06/2013   CLINICAL DATA:  Anterior right shoulder pain and bruising  EXAM: RIGHT SHOULDER - 2+ VIEW  COMPARISON:  MR EXTREM UP JT*R* W/O CM dated 11/12/2007  FINDINGS: Right shoulder arthroplasty noted. No evidence for hardware complication. No fracture line visualized. No radiopaque foreign body otherwise. Right lung apex is grossly clear.  IMPRESSION: No acute abnormality identified.   Electronically Signed   By: Conchita Paris M.D.   On: 08/06/2013 18:40   Dg Elbow Complete Right  08/06/2013   CLINICAL DATA:  Fall  EXAM: RIGHT ELBOW - COMPLETE 3+ VIEW  COMPARISON:  None.  FINDINGS: There is no evidence of fracture, dislocation, or joint effusion. There is no evidence of arthropathy or other focal bone abnormality. Soft tissues are unremarkable.  IMPRESSION: Negative.   Electronically Signed   By: Conchita Paris M.D.   On: 08/06/2013 18:43   Dg Wrist Complete Right  08/06/2013   CLINICAL DATA:  Fall  EXAM: RIGHT WRIST - COMPLETE 3+ VIEW  COMPARISON:  None.  FINDINGS: There is a mildly comminuted distal right radial mid-diaphyseal fracture with apex volar angulation of the fracture fragments. Overlying soft tissue swelling is evident. No radiopaque foreign body. First carpometacarpal joint degenerative change noted. Possible radiopacity between the thumb  and second digit is likely artifactual.   IMPRESSION: Mildly comminuted distal right radial metaphysis fracture.   Electronically Signed   By: Conchita Paris M.D.   On: 08/06/2013 18:41   Ct Head Wo Contrast  08/06/2013   CLINICAL DATA:  Fall, right forehead laceration, head trauma  EXAM: CT HEAD WITHOUT CONTRAST  CT CERVICAL SPINE WITHOUT CONTRAST  TECHNIQUE: Multidetector CT imaging of the head and cervical spine was performed following the standard protocol without intravenous contrast. Multiplanar CT image reconstructions of the cervical spine were also generated.  COMPARISON:  No comparisons  FINDINGS: CT HEAD FINDINGS  No acute hemorrhage, infarct, or mass lesion is identified. Tiny right basal ganglial CSF density lacunar infarct image 12. No midline shift. Minimal ventricular prominence in proportion to degree of cortical volume loss. No skull fracture. Orbits and paranasal sinuses are unremarkable.  CT CERVICAL SPINE FINDINGS  C1 through the cervicothoracic junction is visualized in its entirety. Reversal of the normal lordosis is noted centered at C5. Disc degenerative change is noted with posterior osteophyte formation producing minimal narrowing of the neural foramina at C5-C6 and C6-C7. Likely chronic mild decrease in vertebral body height noted at C5 and C6. No acute fracture or dislocation. Proliferative multilevel facet osteoarthritic change noted. Minimal biapical pleural thickening is noted at the lung apices.  IMPRESSION: No acute intracranial abnormality.  Disc degenerative change most prominent at C5-C7 without acute abnormality of the cervical spine.   Electronically Signed   By: Conchita Paris M.D.   On: 08/06/2013 18:59   Ct Cervical Spine Wo Contrast  08/06/2013   CLINICAL DATA:  Fall, right forehead laceration, head trauma  EXAM: CT HEAD WITHOUT CONTRAST  CT CERVICAL SPINE WITHOUT CONTRAST  TECHNIQUE: Multidetector CT imaging of the head and cervical spine was performed following the standard protocol without intravenous  contrast. Multiplanar CT image reconstructions of the cervical spine were also generated.  COMPARISON:  No comparisons  FINDINGS: CT HEAD FINDINGS  No acute hemorrhage, infarct, or mass lesion is identified. Tiny right basal ganglial CSF density lacunar infarct image 12. No midline shift. Minimal ventricular prominence in proportion to degree of cortical volume loss. No skull fracture. Orbits and paranasal sinuses are unremarkable.  CT CERVICAL SPINE FINDINGS  C1 through the cervicothoracic junction is visualized in its entirety. Reversal of the normal lordosis is noted centered at C5. Disc degenerative change is noted with posterior osteophyte formation producing minimal narrowing of the neural foramina at C5-C6 and C6-C7. Likely chronic mild decrease in vertebral body height noted at C5 and C6. No acute fracture or dislocation. Proliferative multilevel facet osteoarthritic change noted. Minimal biapical pleural thickening is noted at the lung apices.  IMPRESSION: No acute intracranial abnormality.  Disc degenerative change most prominent at C5-C7 without acute abnormality of the cervical spine.   Electronically Signed   By: Conchita Paris M.D.   On: 08/06/2013 18:59     EKG Interpretation None      MDM   Final diagnoses:  Head injury  Radius distal fracture   mechanical fall while making the bed and hitting his head and right wrist on the floor. Denies loss of consciousness. He is on Coumadin for history of atrial fibrillation. No neck, chest, back or abdominal pain.  Hemoglobin stable, INR 2.3 CT head and C-spine negative. Comminuted distal radius fracture noted on x-ray. Radial pulse remains intact.  Radius fracture discussed with Dr. Aline Brochure. He agrees that the fracture does not need manipulation after viewing the  Xrays. Will place in sugar tong splint.  Patient is able to ambulate. Denies any dizziness or lightheadedness. Follow up with Dr. Aline Brochure this week. Return precautions  discussed including risk of delayed hemorrhage given anticoagulant use.    Date: 08/06/2013  Rate: 67  Rhythm: atrial fibrillation  QRS Axis: normal  Intervals: normal  ST/T Wave abnormalities: nonspecific ST/T changes  Conduction Disutrbances:none  Narrative Interpretation:   Old EKG Reviewed: unchanged  BP 118/76  Pulse 84  Temp(Src) 98 F (36.7 C) (Oral)  Resp 20  Ht $R'5\' 7"'Herrings$  (1.702 m)  Wt 179 lb (81.194 kg)  BMI 28.03 kg/m2  SpO2 97%    I personally performed the services described in this documentation, which was scribed in my presence. The recorded information has been reviewed and is accurate.    Ezequiel Essex, MD 08/07/13 Dyann Kief

## 2013-08-06 NOTE — ED Notes (Signed)
Per patient fell today and hit carpeted floor. Patient has abrasion to forehead. Patient c/o right wrist pain. Denies any LOC, headache, or blurred vision. Patient does take coumadin.

## 2013-08-06 NOTE — Discharge Instructions (Signed)
Radial Fracture Follow up with Dr. Aline Brochure this week. Return to the ED if you develop worsening pain, confusion, weakness, numbness, tingling, behavior change or any other concerns. You have a broken bone (fracture) of the forearm. This is the part of your arm between the elbow and your wrist. Your forearm is made up of two bones. These are the radius and ulna. Your fracture is in the radial shaft. This is the bone in your forearm located on the thumb side. A cast or splint is used to protect and keep your injured bone from moving. The cast or splint will be on generally for about 5 to 6 weeks, with individual variations. HOME CARE INSTRUCTIONS   Keep the injured part elevated while sitting or lying down. Keep the injury above the level of your heart (the center of the chest). This will decrease swelling and pain.  Apply ice to the injury for 15-20 minutes, 03-04 times per day while awake, for 2 days. Put the ice in a plastic bag and place a towel between the bag of ice and your cast or splint.  Move your fingers to avoid stiffness and minimize swelling.  If you have a plaster or fiberglass cast:  Do not try to scratch the skin under the cast using sharp or pointed objects.  Check the skin around the cast every day. You may put lotion on any red or sore areas.  Keep your cast dry and clean.  If you have a plaster splint:  Wear the splint as directed.  You may loosen the elastic around the splint if your fingers become numb, tingle, or turn cold or blue.  Do not put pressure on any part of your cast or splint. It may break. Rest your cast only on a pillow for the first 24 hours until it is fully hardened.  Your cast or splint can be protected during bathing with a plastic bag. Do not lower the cast or splint into water.  Only take over-the-counter or prescription medicines for pain, discomfort, or fever as directed by your caregiver. SEEK IMMEDIATE MEDICAL CARE IF:   Your cast gets  damaged or breaks.  You have more severe pain or swelling than you did before getting the cast.  You have severe pain when stretching your fingers.  There is a bad smell, new stains and/or pus-like (purulent) drainage coming from under the cast.  Your fingers or hand turn pale or blue and become cold or your loose feeling. Document Released: 08/21/2005 Document Revised: 06/02/2011 Document Reviewed: 11/17/2005 [facility] Patient Information 2014 [place].  Head Injury, Adult You have received a head injury. It does not appear serious at this time. Headaches and vomiting are common following head injury. It should be easy to awaken from sleeping. Sometimes it is necessary for you to stay in the emergency department for a while for observation. Sometimes admission to the hospital may be needed. After injuries such as yours, most problems occur within the first 24 hours, but side effects may occur up to 7 10 days after the injury. It is important for you to carefully monitor your condition and contact your health care provider or seek immediate medical care if there is a change in your condition. WHAT ARE THE TYPES OF HEAD INJURIES? Head injuries can be as minor as a bump. Some head injuries can be more severe. More severe head injuries include:  A jarring injury to the brain (concussion).  A bruise of the brain (contusion). This  mean there is bleeding in the brain that can cause swelling.  A cracked skull (skull fracture).  Bleeding in the brain that collects, clots, and forms a bump (hematoma). WHAT CAUSES A HEAD INJURY? A serious head injury is most likely to happen to someone who is in a car wreck and is not wearing a seat belt. Other causes of major head injuries include bicycle or motorcycle accidents, sports injuries, and falls. HOW ARE HEAD INJURIES DIAGNOSED? A complete history of the event leading to the injury and your current symptoms will be helpful in diagnosing head  injuries. Many times, pictures of the brain, such as CT or MRI are needed to see the extent of the injury. Often, an overnight hospital stay is necessary for observation.  WHEN SHOULD I SEEK IMMEDIATE MEDICAL CARE?  You should get help right away if:  You have confusion or drowsiness.  You feel sick to your stomach (nauseous) or have continued, forceful vomiting.  You have dizziness or unsteadiness that is getting worse.  You have severe, continued headaches not relieved by medicine. Only take over-the-counter or prescription medicines for pain, fever, or discomfort as directed by your health care provider.  You do not have normal function of the arms or legs or are unable to walk.  You notice changes in the black spots in the center of the colored part of your eye (pupil).  You have a clear or bloody fluid coming from your nose or ears.  You have a loss of vision. During the next 24 hours after the injury, you must stay with someone who can watch you for the warning signs. This person should contact local emergency services (911 in the [place] if you have seizures, you become unconscious, or you are unable to wake up. HOW CAN I PREVENT A HEAD INJURY IN THE FUTURE? The most important factor for preventing major head injuries is avoiding motor vehicle accidents. To minimize the potential for damage to your head, it is crucial to wear seat belts while riding in motor vehicles. Wearing helmets while bike riding and playing collision sports (like football) is also helpful. Also, avoiding dangerous activities around the house will further help reduce your risk of head injury.  WHEN CAN I RETURN TO NORMAL ACTIVITIES AND ATHLETICS? You should be reevaluated by your health care provider before returning to these activities. If you have any of the following symptoms, you should not return to activities or contact sports until 1 week after the symptoms have stopped:  Persistent headache.  Dizziness  or vertigo.  Poor attention and concentration.  Confusion.  Memory problems.  Nausea or vomiting.  Fatigue or tire easily.  Irritability.  Intolerant of bright lights or loud noises.  Anxiety or depression.  Disturbed sleep. MAKE SURE YOU:   Understand these instructions.  Will watch your condition.  Will get help right away if you are not doing well or get worse. Document Released: 03/10/2005 Document Revised: 12/29/2012 Document Reviewed: 11/15/2012 [facility] Patient Information 2014 [place].

## 2013-08-08 ENCOUNTER — Telehealth: Payer: Self-pay | Admitting: Orthopedic Surgery

## 2013-08-08 ENCOUNTER — Ambulatory Visit: Payer: Medicare Other | Admitting: Orthopedic Surgery

## 2013-08-08 NOTE — Telephone Encounter (Signed)
Per patient's wife, said Charles [name] has an appointment with an orthopedist he has seen previously.  Will not need appointment with Dr. Aline Brochure

## 2013-08-10 MED FILL — Hydrocodone-Acetaminophen Tab 5-325 MG: ORAL | Qty: 6 | Status: AC

## 2013-08-17 ENCOUNTER — Ambulatory Visit (INDEPENDENT_AMBULATORY_CARE_PROVIDER_SITE_OTHER): Payer: Medicare Other | Admitting: *Deleted

## 2013-08-17 DIAGNOSIS — Z7901 Long term (current) use of anticoagulants: Secondary | ICD-10-CM

## 2013-08-17 DIAGNOSIS — I4891 Unspecified atrial fibrillation: Secondary | ICD-10-CM

## 2013-08-17 DIAGNOSIS — Z5181 Encounter for therapeutic drug level monitoring: Secondary | ICD-10-CM

## 2013-08-17 LAB — POCT INR: INR: 2

## 2013-08-18 ENCOUNTER — Other Ambulatory Visit (HOSPITAL_COMMUNITY): Payer: Medicare Other

## 2013-08-18 ENCOUNTER — Ambulatory Visit (HOSPITAL_COMMUNITY): Payer: Medicare Other | Admitting: Oncology

## 2013-08-19 NOTE — Progress Notes (Signed)
Labs drawn

## 2013-09-08 ENCOUNTER — Ambulatory Visit (INDEPENDENT_AMBULATORY_CARE_PROVIDER_SITE_OTHER): Payer: Medicare Other | Admitting: *Deleted

## 2013-09-08 ENCOUNTER — Encounter: Payer: Self-pay | Admitting: Cardiology

## 2013-09-08 ENCOUNTER — Ambulatory Visit (INDEPENDENT_AMBULATORY_CARE_PROVIDER_SITE_OTHER): Payer: Medicare Other | Admitting: Cardiology

## 2013-09-08 VITALS — BP 108/62 | HR 73 | Ht 67.0 in | Wt 181.0 lb

## 2013-09-08 DIAGNOSIS — I4891 Unspecified atrial fibrillation: Secondary | ICD-10-CM

## 2013-09-08 DIAGNOSIS — Z01810 Encounter for preprocedural cardiovascular examination: Secondary | ICD-10-CM | POA: Insufficient documentation

## 2013-09-08 DIAGNOSIS — I428 Other cardiomyopathies: Secondary | ICD-10-CM

## 2013-09-08 DIAGNOSIS — Z7901 Long term (current) use of anticoagulants: Secondary | ICD-10-CM

## 2013-09-08 DIAGNOSIS — Z5181 Encounter for therapeutic drug level monitoring: Secondary | ICD-10-CM

## 2013-09-08 DIAGNOSIS — I509 Heart failure, unspecified: Secondary | ICD-10-CM

## 2013-09-08 DIAGNOSIS — I340 Nonrheumatic mitral (valve) insufficiency: Secondary | ICD-10-CM

## 2013-09-08 DIAGNOSIS — I5042 Chronic combined systolic (congestive) and diastolic (congestive) heart failure: Secondary | ICD-10-CM

## 2013-09-08 DIAGNOSIS — I482 Chronic atrial fibrillation, unspecified: Secondary | ICD-10-CM

## 2013-09-08 DIAGNOSIS — I059 Rheumatic mitral valve disease, unspecified: Secondary | ICD-10-CM

## 2013-09-08 LAB — POCT INR
INR: 4.8
INR: 4.8

## 2013-09-08 NOTE — Progress Notes (Signed)
Clinical Summary [name] is a medically complex 73 y.o.male recently seen in May. Interval history includes right arm fracture due to fall without syncope. He is wearing a cast and sling which may be taken off soon. He also apparently has an inguinal hernia and is being considered for elective repair by Dr. Romona Curls in August.  From a cardiac perspective he has been doing fairly well, good heart rate control in atrial fibrillation, NYHA class II dyspnea, no chest pain.   Recent lab work in May showed potassium 3.8, BUN 23, creatinine 1.1, hemoglobin 10.8, platelets 227, , INR 2.3, troponin I normal. ECG in May showed rate controlled atrial fibrillation with intermittent right bundle branch block pattern and nonspecific ST changes.  Followup echocardiogram in March of this year demonstrated mild LVH with LVEF 35-40%, inferolateral hypokinesis, grade 2 diastolic dysfunction, severe left atrial enlargement, MAC with thickened mitral leaflets and moderate mitral regurgitation, sclerotic aortic valve, trivial tricuspid regurgitation, unable to assess PASP.   Allergies  Allergen Reactions  . Metoprolol Shortness Of Breath    Intolerant to all BB  . Statins     Intolerant secondary to severe myalgias    Current Outpatient Prescriptions  Medication Sig Dispense Refill  . ALPRAZolam (XANAX) 1 MG tablet Take 0.5 mg by mouth 3 (three) times daily.       . cholecalciferol (VITAMIN D) 1000 UNITS tablet Take 1,000 Units by mouth every morning.      . digoxin (LANOXIN) 0.125 MG tablet Take 0.125 mg by mouth every morning.      . diltiazem (CARDIZEM CD) 180 MG 24 hr capsule Take 1 capsule (180 mg total) by mouth daily.  90 capsule  3  . docusate sodium (COLACE) 50 MG capsule Take 50-100 mg by mouth every morning.       . furosemide (LASIX) 80 MG tablet Take 80 mg by mouth 2 (two) times daily.      Marland Kitchen HYDROcodone-acetaminophen (NORCO) 10-325 MG per tablet Take 1 tablet by mouth every 6 (six) hours  as needed for moderate pain.      Marland Kitchen lovastatin (MEVACOR) 10 MG tablet Take 10 mg by mouth at bedtime.       Marland Kitchen omeprazole (PRILOSEC) 20 MG capsule Take 20 mg by mouth every morning.       . polyethylene glycol (MIRALAX / GLYCOLAX) packet Take 17 g by mouth daily as needed for mild constipation or moderate constipation.       . potassium chloride SA (K-DUR,KLOR-CON) 20 MEQ tablet Take 20 mEq by mouth 2 (two) times daily.      Marland Kitchen PROAIR HFA 108 (90 BASE) MCG/ACT inhaler Inhale 1 puff into the lungs every 6 (six) hours as needed. For shortness of breath      . sertraline (ZOLOFT) 50 MG tablet Take 50 mg by mouth at bedtime.      . Simethicone (PHAZYME PO) Take 1 capsule by mouth daily as needed (for gas relief/stomach symptoms).      . tiotropium (SPIRIVA) 18 MCG inhalation capsule Place 18 mcg into inhaler and inhale daily as needed.      . traMADol (ULTRAM) 50 MG tablet Take 50 mg by mouth every 6 (six) hours as needed.      . warfarin (COUMADIN) 5 MG tablet Take 2.5-5 mg by mouth daily. Take 1/2 tablet (2.5mg ) on Mon, Wed, Fri, Sun and 1 tab (5mg ) on Tues, Thurs, Sat.       No current facility-administered medications  for this visit.   Facility-Administered Medications Ordered in Other Visits  Medication Dose Route Frequency Provider Last Rate Last Dose  . sodium chloride 0.9 % injection 10 mL  10 mL Intracatheter PRN Pieter Partridge, MD   10 mL at 10/15/10 1230    Past Medical History  Diagnosis Date  . Essential hypertension, benign   . Atrial fibrillation 10/2008  . 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 Peptic ulcer disease   . Degenerative joint disease     Knees and shoulders  . Pneumonia 2010  . Allergic rhinitis   . Anxiety   . Drug abuse   . Tobacco abuse     50 pack years  . Mitral regurgitation     Moderate to severe May 2014  . COPD (chronic obstructive pulmonary disease)   . 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  Basal cell carcinoma     Social History [name] reports that he quit smoking about 16 months ago. His smoking use included Cigarettes. He has a 50 pack-year smoking history. He has never used smokeless tobacco. [name] reports that he does not drink alcohol.  Review of Systems Other systems reviewed and negative.  Physical Examination Filed Vitals:   09/08/13 1142  BP: 108/62  Pulse: 73   Filed Weights   09/08/13 1142  Weight: 181 lb (82.101 kg)    Chronically ill-appearing male in no distress.  HEENT: Conjunctiva and lids normal, oropharynx clear.  Neck: Supple, no elevated JVP or carotid bruits, no thyromegaly.  Lungs: Clear to auscultation, decreased in bases, nonlabored breathing at rest.  Cardiac: Irregularly irregular, no S3, apical systolic murmur, no pericardial rub.  Abdomen: Soft, nontender, bowel sounds present, no guarding or rebound.  Extremities: Trace ankle edema, distal pulses 2+. Right arm in cast and sling. Skin: Warm and dry.  Musculoskeletal: No kyphosis.  Neuropsychiatric: Alert and oriented x3, flat affect .   Problem List and Plan   Atrial fibrillation Continue strategy of heart rate control and anticoagulation.  Chronic combined systolic and diastolic CHF (congestive heart failure) Symptomatically stable on current medical regimen. He has not been able to tolerate beta blocker therapy, therefore continues on digoxin, Cardizem CD mainly for rate control of atrial fibrillation, and Lasix. Weight has been stable.  Mitral regurgitation Moderate by most recent echocardiogram in March.  Nonischemic cardiomyopathy LVEF 35-40% with grade 2 diastolic dysfunction.  Preoperative cardiovascular examination Patient being considered for elective inguinal hernia surgery in August by Dr. Romona Curls. He has been stable from a cardiac perspective, should not require any further cardiovascular testing at this point. Expect that his perioperative  cardiovascular risk is acceptable and that he should be able to proceed. He will need to hold Coumadin for the surgery, would suggest stopping at 4-5 days prior to date of operation. This can be coordinated through our Coumadin clinic.    Satira Sark, M.D., F.A.C.C.

## 2013-09-08 NOTE — Assessment & Plan Note (Signed)
Symptomatically stable on current medical regimen. He has not been able to tolerate beta blocker therapy, therefore continues on digoxin, Cardizem CD mainly for rate control of atrial fibrillation, and Lasix. Weight has been stable.

## 2013-09-08 NOTE — Assessment & Plan Note (Signed)
Patient being considered for elective inguinal hernia surgery in August by Dr. Romona Curls. He has been stable from a cardiac perspective, should not require any further cardiovascular testing at this point. Expect that his perioperative cardiovascular risk is acceptable and that he should be able to proceed. He will need to hold Coumadin for the surgery, would suggest stopping at 4-5 days prior to date of operation. This can be coordinated through our Coumadin clinic.

## 2013-09-08 NOTE — Patient Instructions (Signed)
Your physician recommends that you schedule a follow-up appointment in: 3 months at the [place] office. Your physician recommends that you continue on your current medications as directed. Please refer to the Current Medication list given to you today.

## 2013-09-08 NOTE — Assessment & Plan Note (Signed)
Continue strategy of heart rate control and anticoagulation. 

## 2013-09-08 NOTE — Assessment & Plan Note (Signed)
LVEF 35-40% with grade 2 diastolic dysfunction.

## 2013-09-08 NOTE — Assessment & Plan Note (Signed)
Moderate by most recent echocardiogram in March.

## 2013-09-19 ENCOUNTER — Ambulatory Visit (INDEPENDENT_AMBULATORY_CARE_PROVIDER_SITE_OTHER): Payer: Medicare Other | Admitting: *Deleted

## 2013-09-19 DIAGNOSIS — I4891 Unspecified atrial fibrillation: Secondary | ICD-10-CM

## 2013-09-19 DIAGNOSIS — I482 Chronic atrial fibrillation, unspecified: Secondary | ICD-10-CM

## 2013-09-19 DIAGNOSIS — Z7901 Long term (current) use of anticoagulants: Secondary | ICD-10-CM

## 2013-09-19 DIAGNOSIS — Z5181 Encounter for therapeutic drug level monitoring: Secondary | ICD-10-CM

## 2013-09-19 LAB — POCT INR: INR: 1.8

## 2013-10-03 ENCOUNTER — Inpatient Hospital Stay: Admit: 2013-10-03 | Payer: Self-pay | Admitting: Orthopedic Surgery

## 2013-10-03 ENCOUNTER — Ambulatory Visit (INDEPENDENT_AMBULATORY_CARE_PROVIDER_SITE_OTHER): Payer: Medicare Other | Admitting: *Deleted

## 2013-10-03 DIAGNOSIS — Z5181 Encounter for therapeutic drug level monitoring: Secondary | ICD-10-CM

## 2013-10-03 DIAGNOSIS — I4891 Unspecified atrial fibrillation: Secondary | ICD-10-CM

## 2013-10-03 DIAGNOSIS — Z7901 Long term (current) use of anticoagulants: Secondary | ICD-10-CM

## 2013-10-03 LAB — POCT INR: INR: 2.6

## 2013-10-03 SURGERY — ARTHROPLASTY, KNEE, TOTAL
Anesthesia: Choice | Site: Knee | Laterality: Left

## 2013-10-20 ENCOUNTER — Ambulatory Visit (INDEPENDENT_AMBULATORY_CARE_PROVIDER_SITE_OTHER): Payer: Medicare Other | Admitting: *Deleted

## 2013-10-20 DIAGNOSIS — Z7901 Long term (current) use of anticoagulants: Secondary | ICD-10-CM

## 2013-10-20 DIAGNOSIS — Z5181 Encounter for therapeutic drug level monitoring: Secondary | ICD-10-CM

## 2013-10-20 DIAGNOSIS — I4891 Unspecified atrial fibrillation: Secondary | ICD-10-CM

## 2013-10-20 LAB — POCT INR: INR: 2.2

## 2013-10-31 ENCOUNTER — Ambulatory Visit (INDEPENDENT_AMBULATORY_CARE_PROVIDER_SITE_OTHER): Payer: Medicare Other | Admitting: *Deleted

## 2013-10-31 DIAGNOSIS — Z5181 Encounter for therapeutic drug level monitoring: Secondary | ICD-10-CM

## 2013-10-31 DIAGNOSIS — I4891 Unspecified atrial fibrillation: Secondary | ICD-10-CM

## 2013-10-31 DIAGNOSIS — Z7901 Long term (current) use of anticoagulants: Secondary | ICD-10-CM

## 2013-10-31 LAB — POCT INR: INR: 2.3

## 2013-11-01 ENCOUNTER — Other Ambulatory Visit (HOSPITAL_COMMUNITY): Payer: Medicare Other

## 2013-11-03 ENCOUNTER — Ambulatory Visit (HOSPITAL_COMMUNITY): Admit: 2013-11-03 | Payer: Medicare Other | Admitting: General Surgery

## 2013-11-03 ENCOUNTER — Encounter (HOSPITAL_COMMUNITY): Payer: Self-pay

## 2013-11-03 SURGERY — REPAIR, HERNIA, INGUINAL, ADULT
Anesthesia: General | Laterality: Left

## 2013-11-06 NOTE — Progress Notes (Signed)
Charles Bellow, MD [place] [address]  Follicular lymphoma - Plan: CT Abdomen Pelvis W Contrast, CT Chest W Contrast, CT Soft Tissue Neck W Contrast, CBC with Differential, Reticulocytes, Comprehensive metabolic panel, Lactate dehydrogenase  Iron deficiency - Plan: Ferritin  CURRENT THERAPY: Surveillance per NCCN guidelines  INTERVAL HISTORY: Charles [name] 73 y.o. 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 He is S/P Bendamustine/Rituxan finishing 6 cycles of 11/05/2010 followed by 4 maintenance Rituxan infusion stopping after the 4th infusion on 12/09/2011 secondary to complications. Last PET scan in April 2014 did not show any evidence of lymphoma.    Follicular lymphoma   08/19/4130 Imaging CT soft tissue of neck- Adenopathy more notable on right neck than left.   06/07/2010 Initial Diagnosis FNA of right neck mass concerning for lymphoma.   06/24/2010 Procedure Excisional lymph node biopsy- GM01 NHL, follicular type, grade III.   07/09/2010 Bone Marrow Biopsy Bone marrow aspiration and biopsy- rare minute atypical lymphoid aggregates, cellular marrow with complete trilineage hematopoiesis, no blasts found on peripheral blood   07/11/2010 Imaging PET- confluent right cervical lymphadenopathy with a foci within the skeleton and dominant left iliac lesions   07/17/2010 - 11/05/2010 Chemotherapy Bendamustine + Rituxan x 6 cycles   09/12/2010 Remission PET- Interval complete metabolic response to therapy    11/20/2010 Imaging PET- No evidence of residual or recurrent hypermetabolic lymphoma.    02/17/2011 - 12/09/2011 Chemotherapy Rituxan every 90 days maintenance x 4.  D/C'd due to pseudomonas bacteremia   06/06/2011 Imaging PET- No specific features identified to suggest residual or recurrence of tumor.   01/30/2012 Adverse Reaction Pseudomonas  bacteremia   06/22/2012 Imaging PET- No findings to suggest lymphomatous involvement in the neck, chest, abdomen, or pelvis.   07/06/2013 Imaging CT CAP- Stable mediastinal and hilar lymph nodes. No findings for lymphoma in the abdomen or pelvis.   Charles [name] is here a little sooner than previously planned.  He reports that he was seeing Dr. Romona Curls for a left inguinal hernia repair and there were difficulties with the patient's INR which interfered with surgery.  During this time, he notes that his right neck lymph node is enlarged.  Thus, he is here sooner than expected.    Of note, he has a right lower tooth that needs extracted as it has broken off beyond repair.  This may be a culprit of the right sided lymphadenopathy, but given his history and diagnosis of follicular lymphoma, it warrants work-up for lymphoma.    He denies any B symptoms.  I do not appreciate any other lymphadenopathy or organomegaly. He denies any cough, or nasal complaints.  He denies a sore throat.   Other than lymphadenopathy, he denies any complaints and ROS questioning is negative.  Past Medical History  Diagnosis Date  . Essential hypertension, benign   . Atrial fibrillation 10/2008  . 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 Peptic ulcer disease   . Degenerative joint disease     Knees and shoulders  . Pneumonia 2010  . Allergic rhinitis   . Anxiety   . Drug abuse   . Tobacco abuse     50 pack years  . Mitral regurgitation     Moderate to severe May 2014  . COPD (chronic obstructive pulmonary disease)   . Follicular lymphoma  09/28/2010  . Basal cell carcinoma   . Hernia, inguinal, left   . Arm fracture, right 07/2013    has TOBACCO ABUSE; Essential hypertension, benign; Atrial fibrillation; Chronic anticoagulation; Follicular lymphoma; Hyperlipidemia; Chronic combined systolic and diastolic CHF (congestive heart failure); Nonischemic cardiomyopathy;  Mitral regurgitation; OA (osteoarthritis) of knee; Encounter for therapeutic drug monitoring; and Preoperative cardiovascular examination on his problem list.     is allergic to metoprolol and statins.  Charles [name] does not currently have medications on file.  Past Surgical History  Procedure Laterality Date  . Neck lesion biopsy  June 24, 2010    Follicular lymphoma  . Skin cancer excision      under left breast; variously described as melanoma and basal cell  . Rotator cuff repair      right  . Patella fracture surgery Left 1975  . Hematoma evacuation      after melanoma removal  . Portacath placement  07/12/2010  . Colonoscopy  2009    Also underwent an EGD; patient reports no significant findings  . Bone marrow aspiration  06/2012  . Bone marrow biopsy  06/2012  . Fungal infection    . Port-a-cath removal Left 01/17/2013    Procedure: REMOVAL PORT-A-CATH;  Surgeon: Marlane Hatcher, MD;  Location: [facility];  Service: General;  Laterality: Left;    Denies any headaches, dizziness, double vision, fevers, chills, night sweats, nausea, vomiting, diarrhea, constipation, chest pain, heart palpitations, shortness of breath, blood in stool, black tarry stool, urinary pain, urinary burning, urinary frequency, hematuria.   PHYSICAL EXAMINATION  ECOG PERFORMANCE STATUS: 1 - Symptomatic but completely ambulatory  Filed Vitals:   11/08/13 1451  BP: 118/90  Pulse: 65  Temp: 97.7 F (36.5 C)  Resp: 18    GENERAL:alert, no distress, well nourished, well developed, comfortable, cooperative and smiling SKIN: skin color, texture, turgor are normal, no rashes or significant lesions HEAD: Normocephalic, No masses, lesions, tenderness or abnormalities EYES: normal, PERRLA, EOMI, Conjunctiva are pink and non-injected EARS: External ears normal OROPHARYNX:lips, buccal mucosa, and tongue normal, mucous membranes are moist and right lower tooth requiring dental intervention  NECK: supple,  trachea midline, right angle of the jaw lymph node measuring 1.5 cm with a smaller lymph node that is more mobile measuring 0.5 cm in size inferiorly to the right angle of the jaw node. LYMPH:  See neck exam.  No other lymphadenopathy noted. BREAST:not examined LUNGS: not examined HEART: not examined ABDOMEN:not examined BACK: Back symmetric, no curvature. EXTREMITIES:less then 2 second capillary refill, no skin discoloration, no cyanosis  NEURO: alert & oriented x 3 with fluent speech, no focal motor/sensory deficits   LABORATORY DATA: CBC    Component Value Date/Time   WBC 5.3 08/06/2013 1652   RBC 4.04* 08/06/2013 1652   HGB 10.8* 08/06/2013 1652   HCT 34.4* 08/06/2013 1652   PLT 227 08/06/2013 1652   MCV 85.1 08/06/2013 1652   MCH 26.7 08/06/2013 1652   MCHC 31.4 08/06/2013 1652   RDW 16.2* 08/06/2013 1652   LYMPHSABS 0.5* 08/06/2013 1652   MONOABS 0.5 08/06/2013 1652   EOSABS 0.2 08/06/2013 1652   BASOSABS 0.1 08/06/2013 1652      Chemistry      Component Value Date/Time   NA 142 08/06/2013 1652   K 3.8 08/06/2013 1652   CL 98 08/06/2013 1652   CO2 33* 08/06/2013 1652   BUN 23 08/06/2013 1652   CREATININE 1.14 08/06/2013 1652   CREATININE 1.68* 10/30/2012 1112  Component Value Date/Time   CALCIUM 9.5 08/06/2013 1652   ALKPHOS 92 07/29/2013 0912   AST 18 07/29/2013 0912   ALT 9 07/29/2013 0912   BILITOT 0.7 07/29/2013 0912     Lab Results  Component Value Date   INR 2.3 10/31/2013   INR 2.2 10/20/2013   INR 2.6 10/03/2013      ASSESSMENT:  1. Right angle of the jaw lymph node x 2. 2. 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 He is S/P Bendamustine/Rituxan finishing 6 cycles of 11/05/2010 followed by 4 maintenance Rituxan infusion stopping after the 4th infusion on 12/09/2011 secondary to complications. Last PET scan in April 2014 did not show any evidence of lymphoma. Now with 3 right anterior cervical lymph  nodes measuring less than 1.5 cm in size. One is at the right angle of the jaw.  3. Systolic congestive heart failure, on therapy and followed by cardiology  4. Reaction of medication resulting in rash on dorsal aspect of hands, followed by Dermatology.  5. Hoarse voice, secondary to post-nasal drip. 6. Right lower tooth requiring dental intervention.  Patient Active Problem List   Diagnosis Date Noted  . Preoperative cardiovascular examination 09/08/2013  . Encounter for therapeutic drug monitoring 04/25/2013  . OA (osteoarthritis) of knee 12/30/2012  . Mitral regurgitation 10/27/2012  . Chronic combined systolic and diastolic CHF (congestive heart failure) 10/26/2012  . Hyperlipidemia 04/17/2011  . Follicular lymphoma 75/91/6384  . Atrial fibrillation 06/21/2010  . Chronic anticoagulation 06/21/2010  . Nonischemic cardiomyopathy 02/21/2009  . TOBACCO ABUSE 11/22/2008  . Essential hypertension, benign 11/08/2008    PLAN:  1. Chart reviewed 2. Labs today: CBC diff, CMET, LDH, Ferritin, retic count 3. CT CAP and neck with contrast neck week for restaging 4. Recommend dental appointment for right lower tooth requiring dental intervention 5. Return following CT imaging for restaging.   THERAPY PLAN:  We will image the patient with CT scans of CAP and neck.  Depending on results, we may need an excisional lymph node biopsy.  He should have his right lower tooth addressed by his dentist as this may be the culprit as well.  All questions were answered. The patient knows to call the clinic with any problems, questions or concerns. We can certainly see the patient much sooner if necessary.  Patient and plan discussed with Dr. Farrel Gobble and he is in agreement with the aforementioned.   Ryli Standlee 11/08/2013

## 2013-11-08 ENCOUNTER — Encounter (HOSPITAL_COMMUNITY): Payer: Medicare Other | Attending: Oncology | Admitting: Oncology

## 2013-11-08 ENCOUNTER — Encounter (HOSPITAL_COMMUNITY): Payer: Self-pay | Admitting: Oncology

## 2013-11-08 VITALS — BP 118/90 | HR 65 | Temp 97.7°F | Resp 18 | Wt 185.6 lb

## 2013-11-08 DIAGNOSIS — Z888 Allergy status to other drugs, medicaments and biological substances status: Secondary | ICD-10-CM | POA: Insufficient documentation

## 2013-11-08 DIAGNOSIS — I509 Heart failure, unspecified: Secondary | ICD-10-CM | POA: Diagnosis not present

## 2013-11-08 DIAGNOSIS — Z7901 Long term (current) use of anticoagulants: Secondary | ICD-10-CM | POA: Diagnosis not present

## 2013-11-08 DIAGNOSIS — I10 Essential (primary) hypertension: Secondary | ICD-10-CM | POA: Insufficient documentation

## 2013-11-08 DIAGNOSIS — K083 Retained dental root: Secondary | ICD-10-CM | POA: Insufficient documentation

## 2013-11-08 DIAGNOSIS — Z9889 Other specified postprocedural states: Secondary | ICD-10-CM | POA: Insufficient documentation

## 2013-11-08 DIAGNOSIS — I059 Rheumatic mitral valve disease, unspecified: Secondary | ICD-10-CM | POA: Diagnosis not present

## 2013-11-08 DIAGNOSIS — I2510 Atherosclerotic heart disease of native coronary artery without angina pectoris: Secondary | ICD-10-CM | POA: Insufficient documentation

## 2013-11-08 DIAGNOSIS — D509 Iron deficiency anemia, unspecified: Secondary | ICD-10-CM | POA: Diagnosis present

## 2013-11-08 DIAGNOSIS — Z79899 Other long term (current) drug therapy: Secondary | ICD-10-CM | POA: Insufficient documentation

## 2013-11-08 DIAGNOSIS — I4891 Unspecified atrial fibrillation: Secondary | ICD-10-CM | POA: Insufficient documentation

## 2013-11-08 DIAGNOSIS — I5042 Chronic combined systolic (congestive) and diastolic (congestive) heart failure: Secondary | ICD-10-CM | POA: Insufficient documentation

## 2013-11-08 DIAGNOSIS — I428 Other cardiomyopathies: Secondary | ICD-10-CM | POA: Insufficient documentation

## 2013-11-08 DIAGNOSIS — S42309D Unspecified fracture of shaft of humerus, unspecified arm, subsequent encounter for fracture with routine healing: Secondary | ICD-10-CM | POA: Insufficient documentation

## 2013-11-08 DIAGNOSIS — E785 Hyperlipidemia, unspecified: Secondary | ICD-10-CM | POA: Insufficient documentation

## 2013-11-08 DIAGNOSIS — J309 Allergic rhinitis, unspecified: Secondary | ICD-10-CM | POA: Insufficient documentation

## 2013-11-08 DIAGNOSIS — L270 Generalized skin eruption due to drugs and medicaments taken internally: Secondary | ICD-10-CM | POA: Diagnosis not present

## 2013-11-08 DIAGNOSIS — M159 Polyosteoarthritis, unspecified: Secondary | ICD-10-CM | POA: Diagnosis not present

## 2013-11-08 DIAGNOSIS — J4489 Other specified chronic obstructive pulmonary disease: Secondary | ICD-10-CM | POA: Insufficient documentation

## 2013-11-08 DIAGNOSIS — R599 Enlarged lymph nodes, unspecified: Secondary | ICD-10-CM | POA: Insufficient documentation

## 2013-11-08 DIAGNOSIS — E611 Iron deficiency: Secondary | ICD-10-CM

## 2013-11-08 DIAGNOSIS — F411 Generalized anxiety disorder: Secondary | ICD-10-CM | POA: Insufficient documentation

## 2013-11-08 DIAGNOSIS — F17200 Nicotine dependence, unspecified, uncomplicated: Secondary | ICD-10-CM | POA: Insufficient documentation

## 2013-11-08 DIAGNOSIS — K279 Peptic ulcer, site unspecified, unspecified as acute or chronic, without hemorrhage or perforation: Secondary | ICD-10-CM | POA: Diagnosis not present

## 2013-11-08 DIAGNOSIS — J449 Chronic obstructive pulmonary disease, unspecified: Secondary | ICD-10-CM | POA: Diagnosis not present

## 2013-11-08 DIAGNOSIS — C8299 Follicular lymphoma, unspecified, extranodal and solid organ sites: Secondary | ICD-10-CM

## 2013-11-08 DIAGNOSIS — C8290 Follicular lymphoma, unspecified, unspecified site: Secondary | ICD-10-CM

## 2013-11-08 LAB — COMPREHENSIVE METABOLIC PANEL
ALK PHOS: 96 U/L (ref 39–117)
ALT: 11 U/L (ref 0–53)
AST: 18 U/L (ref 0–37)
Albumin: 4 g/dL (ref 3.5–5.2)
Anion gap: 11 (ref 5–15)
BUN: 28 mg/dL — ABNORMAL HIGH (ref 6–23)
CALCIUM: 9.4 mg/dL (ref 8.4–10.5)
CO2: 32 mEq/L (ref 19–32)
CREATININE: 1.14 mg/dL (ref 0.50–1.35)
Chloride: 95 mEq/L — ABNORMAL LOW (ref 96–112)
GFR calc non Af Amer: 61 mL/min — ABNORMAL LOW (ref >90)
GFR, EST AFRICAN AMERICAN: 71 mL/min — AB (ref >90)
GLUCOSE: 92 mg/dL (ref 70–99)
Potassium: 4 mEq/L (ref 3.7–5.3)
Sodium: 138 mEq/L (ref 137–147)
TOTAL PROTEIN: 7.7 g/dL (ref 6.0–8.3)
Total Bilirubin: 0.4 mg/dL (ref 0.3–1.2)

## 2013-11-08 LAB — CBC WITH DIFFERENTIAL/PLATELET
BASOS ABS: 0.1 10*3/uL (ref 0.0–0.1)
Basophils Relative: 1 % (ref 0–1)
EOS ABS: 0.5 10*3/uL (ref 0.0–0.7)
Eosinophils Relative: 5 % (ref 0–5)
HCT: 37.4 % — ABNORMAL LOW (ref 39.0–52.0)
Hemoglobin: 12.6 g/dL — ABNORMAL LOW (ref 13.0–17.0)
Lymphocytes Relative: 8 % — ABNORMAL LOW (ref 12–46)
Lymphs Abs: 0.7 10*3/uL (ref 0.7–4.0)
MCH: 31 pg (ref 26.0–34.0)
MCHC: 33.7 g/dL (ref 30.0–36.0)
MCV: 91.9 fL (ref 78.0–100.0)
Monocytes Absolute: 0.8 10*3/uL (ref 0.1–1.0)
Monocytes Relative: 8 % (ref 3–12)
NEUTROS PCT: 78 % — AB (ref 43–77)
Neutro Abs: 7.5 10*3/uL (ref 1.7–7.7)
PLATELETS: 238 10*3/uL (ref 150–400)
RBC: 4.07 MIL/uL — ABNORMAL LOW (ref 4.22–5.81)
RDW: 16.6 % — AB (ref 11.5–15.5)
WBC: 9.5 10*3/uL (ref 4.0–10.5)

## 2013-11-08 LAB — RETICULOCYTES
RBC.: 4.07 MIL/uL — AB (ref 4.22–5.81)
RETIC CT PCT: 2.4 % (ref 0.4–3.1)
Retic Count, Absolute: 97.7 10*3/uL (ref 19.0–186.0)

## 2013-11-08 LAB — LACTATE DEHYDROGENASE: LDH: 251 U/L — ABNORMAL HIGH (ref 94–250)

## 2013-11-08 NOTE — Progress Notes (Signed)
Charles [name] presented for labwork. Labs per MD order drawn via Peripheral Line 23 gauge needle inserted in right AC  Good blood return present. Procedure without incident.  Needle removed intact. Patient tolerated procedure well.

## 2013-11-08 NOTE — Patient Instructions (Signed)
[facility] Discharge Instructions  RECOMMENDATIONS MADE BY THE CONSULTANT AND ANY TEST RESULTS WILL BE SENT TO YOUR REFERRING PHYSICIAN.  EXAM FINDINGS BY THE PHYSICIAN TODAY AND SIGNS OR SYMPTOMS TO REPORT TO CLINIC OR PRIMARY PHYSICIAN: Exam and findings as discussed by Robynn Pane, PA-C.  Will check some labs today and get you scheduled for CT scans of your neck, chest, abdomen and pelvis and see you back afterwards to discuss options.    INSTRUCTIONS/FOLLOW-UP: Scans as scheduled and office visit after results are available.  Thank you for choosing [facility] to provide your oncology and hematology care.  To afford each patient quality time with our providers, please arrive at least 15 minutes before your scheduled appointment time.  With your help, our goal is to use those 15 minutes to complete the necessary work-up to ensure our physicians have the information they need to help with your evaluation and healthcare recommendations.    Effective January 1st, 2014, we ask that you re-schedule your appointment with our physicians should you arrive 10 or more minutes late for your appointment.  We strive to give you quality time with our providers, and arriving late affects you and other patients whose appointments are after yours.    Again, thank you for choosing [facility].  Our hope is that these requests will decrease the amount of time that you wait before being seen by our physicians.       _____________________________________________________________  Should you have questions after your visit to [facility], please contact our office at [phone] between the hours of 8:30 a.m. and 4:30 p.m.  Voicemails left after 4:30 p.m. will not be returned until the following business day.  For prescription refill requests, have your pharmacy contact our office with your prescription refill request.     _______________________________________________________________  We hope that we have given you very good care.  You may receive a patient satisfaction survey in the mail, please complete it and return it as soon as possible.  We value your feedback!  _______________________________________________________________  Have you asked about our STAR program?  STAR stands for Survivorship Training and Rehabilitation, and this is a nationally recognized cancer care program that focuses on survivorship and rehabilitation.  Cancer and cancer treatments may cause problems, such as, pain, making you feel tired and keeping you from doing the things that you need or want to do. Cancer rehabilitation can help. Our goal is to reduce these troubling effects and help you have the best quality of life possible.  You may receive a survey from a nurse that asks questions about your current state of health.  Based on the survey results, all eligible patients will be referred to the [facility] program for an evaluation so we can better serve you!  A frequently asked questions sheet is available upon request.

## 2013-11-09 LAB — FERRITIN: FERRITIN: 104 ng/mL (ref 22–322)

## 2013-11-10 ENCOUNTER — Ambulatory Visit (INDEPENDENT_AMBULATORY_CARE_PROVIDER_SITE_OTHER): Payer: Medicare Other | Admitting: *Deleted

## 2013-11-10 DIAGNOSIS — I4891 Unspecified atrial fibrillation: Secondary | ICD-10-CM

## 2013-11-10 DIAGNOSIS — Z5181 Encounter for therapeutic drug level monitoring: Secondary | ICD-10-CM

## 2013-11-10 DIAGNOSIS — Z7901 Long term (current) use of anticoagulants: Secondary | ICD-10-CM

## 2013-11-10 LAB — POCT INR: INR: 1.9

## 2013-11-14 ENCOUNTER — Ambulatory Visit (HOSPITAL_COMMUNITY)
Admission: RE | Admit: 2013-11-14 | Discharge: 2013-11-14 | Disposition: A | Discharge status: Home / Self Care | Payer: Medicare Other | Source: Ambulatory Visit | Attending: Oncology | Admitting: Oncology

## 2013-11-14 DIAGNOSIS — Z9221 Personal history of antineoplastic chemotherapy: Secondary | ICD-10-CM | POA: Insufficient documentation

## 2013-11-14 DIAGNOSIS — C8290 Follicular lymphoma, unspecified, unspecified site: Secondary | ICD-10-CM

## 2013-11-14 DIAGNOSIS — Z87898 Personal history of other specified conditions: Secondary | ICD-10-CM | POA: Insufficient documentation

## 2013-11-14 DIAGNOSIS — R220 Localized swelling, mass and lump, head: Secondary | ICD-10-CM | POA: Insufficient documentation

## 2013-11-14 DIAGNOSIS — I2510 Atherosclerotic heart disease of native coronary artery without angina pectoris: Secondary | ICD-10-CM | POA: Diagnosis not present

## 2013-11-14 DIAGNOSIS — I517 Cardiomegaly: Secondary | ICD-10-CM | POA: Diagnosis not present

## 2013-11-14 DIAGNOSIS — R599 Enlarged lymph nodes, unspecified: Secondary | ICD-10-CM | POA: Diagnosis not present

## 2013-11-14 DIAGNOSIS — R221 Localized swelling, mass and lump, neck: Secondary | ICD-10-CM

## 2013-11-14 DIAGNOSIS — K4090 Unilateral inguinal hernia, without obstruction or gangrene, not specified as recurrent: Secondary | ICD-10-CM | POA: Diagnosis not present

## 2013-11-14 MED ORDER — IOHEXOL 300 MG/ML  SOLN
150.0000 mL | Freq: Once | INTRAMUSCULAR | Status: AC | PRN
  Administered 2013-11-14: 150 mL via INTRAVENOUS

## 2013-11-17 ENCOUNTER — Encounter: Payer: Self-pay | Admitting: *Deleted

## 2013-11-17 ENCOUNTER — Encounter (HOSPITAL_COMMUNITY): Payer: Self-pay

## 2013-11-17 ENCOUNTER — Encounter (HOSPITAL_BASED_OUTPATIENT_CLINIC_OR_DEPARTMENT_OTHER): Payer: Medicare Other

## 2013-11-17 VITALS — BP 113/60 | HR 70 | Temp 97.6°F | Resp 16 | Wt 186.8 lb

## 2013-11-17 DIAGNOSIS — I4891 Unspecified atrial fibrillation: Secondary | ICD-10-CM

## 2013-11-17 DIAGNOSIS — C8299 Follicular lymphoma, unspecified, extranodal and solid organ sites: Secondary | ICD-10-CM

## 2013-11-17 DIAGNOSIS — I482 Chronic atrial fibrillation, unspecified: Secondary | ICD-10-CM

## 2013-11-17 DIAGNOSIS — C8290 Follicular lymphoma, unspecified, unspecified site: Secondary | ICD-10-CM

## 2013-11-17 LAB — PROTIME-INR
INR: 2.45 — ABNORMAL HIGH (ref 0.00–1.49)
PROTHROMBIN TIME: 26.6 s — AB (ref 11.6–15.2)

## 2013-11-17 MED ORDER — RIVAROXABAN 15 MG PO TABS: 15.0000 mg | ORAL_TABLET | Freq: Two times a day (BID) | ORAL | Status: DC

## 2013-11-17 NOTE — Progress Notes (Signed)
[place]  OFFICE PROGRESS NOTE  [name], MD [address] [place] [address]  DIAGNOSIS: Follicular lymphoma - Plan: CBC with Differential, D-dimer, quantitative  Chronic atrial fibrillation - Plan: Protime-INR, Protime-INR  Chief Complaint  Patient presents with  . Recurrent lymphoma  . Inguinal Hernia  . Atrial Fibrillation    CURRENT THERAPY: Surveillance per NCCN guidelines, warfarin for chronic atrial fibrillation.  INTERVAL HISTORY: JADD GASIOR 73 y.o. male returns for followup after being treated with 6 cycles of bendamustine and Rituxan on 11/05/2010 followed by 4 maintenance Rituxan infusions stopping after the fourth infusion on 0/94/7096 due to complications with PET scan done in April 2014 showing no evidence of lymphoma. He recently was found to have a left inguinal hernia with some difficulty in adjusting the patient's INR inferior with surgery. Right neck lymph nodes were felt to be enlarged and he was seen on 11/06/2013 and repeat CT scans of the neck, chest, abdomen, and pelvis were performed. There is evidence of lymphadenopathy in the right neck on CT scan. Rest of his scans are normal. He has a crack to which needs to be removed and the dentist is unwilling to do so without a normal INR. Stopping warfarin for 3 days resulted in no improvement in his INR. He is taking to have milligrams alternating 5 mg daily. He has a right inguinal hernia as well which required repair. He denies any jaw pain, fever, night sweats, abdominal pain except in the right lower quadrant where his hernia sometimes becomes entrapped. He denies any lower extremity swelling or redness, worsening cough or shortness of breath, PND, orthopnea, palpitations, skin rash, headache, or seizures.  MEDICAL HISTORY: Past Medical History  Diagnosis Date  . Essential hypertension, benign   . Atrial fibrillation 10/2008  . Nonischemic  cardiomyopathy 02/2009    LVEF improved from 25-30% up to 50-55%, Nonobstructive CAD  . Peptic ulcer disease   . Degenerative joint disease     Knees and shoulders  . Pneumonia 2010  . Allergic rhinitis   . Anxiety   . Drug abuse   . Tobacco abuse     50 pack years  . Mitral regurgitation     Moderate to severe May 2014  . COPD (chronic obstructive pulmonary disease)   . Follicular lymphoma 05/01/3660  . Basal cell carcinoma   . Hernia, inguinal, left   . Arm fracture, right 07/2013  . Hernia, inguinal, left     INTERIM HISTORY: has TOBACCO ABUSE; Essential hypertension, benign; Atrial fibrillation; Chronic anticoagulation; Follicular lymphoma; Hyperlipidemia; Chronic combined systolic and diastolic CHF (congestive heart failure); Nonischemic cardiomyopathy; Mitral regurgitation; OA (osteoarthritis) of knee; Encounter for therapeutic drug monitoring; and Preoperative cardiovascular examination on his problem list.     Follicular lymphoma    9/47/6546  Imaging  CT soft tissue of neck- Adenopathy more notable on right neck than left.    06/07/2010  Initial Diagnosis  FNA of right neck mass concerning for lymphoma.    06/24/2010  Procedure  Excisional lymph node biopsy- TK35 NHL, follicular type, grade III.    07/09/2010  Bone Marrow Biopsy  Bone marrow aspiration and biopsy- rare minute atypical lymphoid aggregates, cellular marrow with complete trilineage hematopoiesis, no blasts found on peripheral blood    07/11/2010  Imaging  PET- confluent right cervical lymphadenopathy with a foci within the skeleton and dominant left iliac lesions    07/17/2010 - 11/05/2010  Chemotherapy  Bendamustine + Rituxan x 6 cycles    09/12/2010  Remission  PET- Interval complete metabolic response to therapy    11/20/2010  Imaging  PET- No evidence of residual or recurrent hypermetabolic lymphoma.    02/17/2011 - 12/09/2011  Chemotherapy  Rituxan every 90 days maintenance x 4. D/C'd due to pseudomonas bacteremia     06/06/2011  Imaging  PET- No specific features identified to suggest residual or recurrence of tumor.    01/30/2012  Adverse Reaction  Pseudomonas bacteremia    06/22/2012  Imaging  PET- No findings to suggest lymphomatous involvement in the neck, chest, abdomen, or pelvis.    07/06/2013  Imaging  CT CAP- Stable mediastinal and hilar lymph nodes. No findings for lymphoma in the abdomen or pelvis.      11/14/2013                  Imaging                   CT neck with right cervical adenopathy. CAP                                                                      negative for lymph node enlargement  ALLERGIES:  is allergic to metoprolol and statins.  MEDICATIONS: has a current medication list which includes the following prescription(s): alprazolam, cholecalciferol, digoxin, diltiazem, docusate sodium, furosemide, hydrocodone-acetaminophen, lovastatin, omeprazole, polyethylene glycol, potassium chloride sa, proair hfa, sertraline, simethicone, tiotropium, tramadol, warfarin, and rivaroxaban, and the following Facility-Administered Medications: sodium chloride.  SURGICAL HISTORY:  Past Surgical History  Procedure Laterality Date  . Neck lesion biopsy  June 23, 8097    Follicular lymphoma  . Skin cancer excision      under left breast; variously described as melanoma and basal cell  . Rotator cuff repair      right  . Patella fracture surgery Left 1975  . Hematoma evacuation      after melanoma removal  . Portacath placement  07/12/2010  . Colonoscopy  2009    Also underwent an EGD; patient reports no significant findings  . Bone marrow aspiration  06/2012  . Bone marrow biopsy  06/2012  . Fungal infection    . Port-a-cath removal Left 01/17/2013    Procedure: REMOVAL PORT-A-CATH;  Surgeon: Scherry Ran, MD;  Location: [facility];  Service: General;  Laterality: Left;    FAMILY HISTORY: family history includes Coronary artery disease in his other; Heart attack in his father.  SOCIAL  HISTORY:  reports that he quit smoking about 19 months ago. His smoking use included Cigarettes. He has a 50 pack-year smoking history. He has never used smokeless tobacco. He reports that he does not drink alcohol or use illicit drugs.  REVIEW OF SYSTEMS:  Other than that discussed above is noncontributory.  PHYSICAL EXAMINATION: ECOG PERFORMANCE STATUS: 1 - Symptomatic but completely ambulatory  Blood pressure 113/60, pulse 70, temperature 97.6 F (36.4 C), temperature source Oral, resp. rate 16, weight 186 lb 12.8 oz (84.732 kg).  GENERAL:alert, no distress and comfortable. SKIN: skin color, texture, turgor are normal, no rashes or significant lesions EYES: PERLA; Conjunctiva are pink and non-injected, sclera clear SINUSES: No redness or tenderness over maxillary  or ethmoid sinuses OROPHARYNX:no exudate, no erythema on lips, buccal mucosa, or tongue. NECK: supple, thyroid normal size, non-tender, without nodularity. No masses. Right submental lymph nodes palpable. CHEST: Increased AP diameter with no breast masses. LYMPH:  no palpable lymphadenopathy in the  axillary or inguinal areas. Right submental lymph nodes palpable. LUNGS: clear to auscultation and percussion with normal breathing effort HEART: regular rate & rhythm and no murmurs. ABDOMEN:abdomen soft, non-tender and normal bowel sounds soft with writing or hernia, easily reducible. MUSCULOSKELETAL:no cyanosis of digits and no clubbing. Range of motion normal.  NEURO: alert & oriented x 3 with fluent speech, no focal motor/sensory deficits   LABORATORY DATA: Office Visit on 11/17/2013  Component Date Value Ref Range Status  . Prothrombin Time 11/17/2013 26.6* 11.6 - 15.2 seconds Final  . INR 11/17/2013 2.45* 0.00 - 1.49 Final  Anti-coag visit on 11/10/2013  Component Date Value Ref Range Status  . INR 11/10/2013 1.9   Final  Office Visit on 11/08/2013  Component Date Value Ref Range Status  . WBC 11/08/2013 9.5  4.0 -  10.5 K/uL Final  . RBC 11/08/2013 4.07* 4.22 - 5.81 MIL/uL Final  . Hemoglobin 11/08/2013 12.6* 13.0 - 17.0 g/dL Final  . HCT 11/08/2013 37.4* 39.0 - 52.0 % Final  . MCV 11/08/2013 91.9  78.0 - 100.0 fL Final  . MCH 11/08/2013 31.0  26.0 - 34.0 pg Final  . MCHC 11/08/2013 33.7  30.0 - 36.0 g/dL Final  . RDW 11/08/2013 16.6* 11.5 - 15.5 % Final  . Platelets 11/08/2013 238  150 - 400 K/uL Final  . Neutrophils Relative % 11/08/2013 78* 43 - 77 % Final  . Neutro Abs 11/08/2013 7.5  1.7 - 7.7 K/uL Final  . Lymphocytes Relative 11/08/2013 8* 12 - 46 % Final  . Lymphs Abs 11/08/2013 0.7  0.7 - 4.0 K/uL Final  . Monocytes Relative 11/08/2013 8  3 - 12 % Final  . Monocytes Absolute 11/08/2013 0.8  0.1 - 1.0 K/uL Final  . Eosinophils Relative 11/08/2013 5  0 - 5 % Final  . Eosinophils Absolute 11/08/2013 0.5  0.0 - 0.7 K/uL Final  . Basophils Relative 11/08/2013 1  0 - 1 % Final  . Basophils Absolute 11/08/2013 0.1  0.0 - 0.1 K/uL Final  . Retic Ct Pct 11/08/2013 2.4  0.4 - 3.1 % Final  . RBC. 11/08/2013 4.07* 4.22 - 5.81 MIL/uL Final  . Retic Count, Manual 11/08/2013 97.7  19.0 - 186.0 K/uL Final  . Sodium 11/08/2013 138  137 - 147 mEq/L Final  . Potassium 11/08/2013 4.0  3.7 - 5.3 mEq/L Final  . Chloride 11/08/2013 95* 96 - 112 mEq/L Final  . CO2 11/08/2013 32  19 - 32 mEq/L Final  . Glucose, Bld 11/08/2013 92  70 - 99 mg/dL Final  . BUN 11/08/2013 28* 6 - 23 mg/dL Final  . Creatinine, Ser 11/08/2013 1.14  0.50 - 1.35 mg/dL Final  . Calcium 11/08/2013 9.4  8.4 - 10.5 mg/dL Final  . Total Protein 11/08/2013 7.7  6.0 - 8.3 g/dL Final  . Albumin 11/08/2013 4.0  3.5 - 5.2 g/dL Final  . AST 11/08/2013 18  0 - 37 U/L Final  . ALT 11/08/2013 11  0 - 53 U/L Final  . Alkaline Phosphatase 11/08/2013 96  39 - 117 U/L Final  . Total Bilirubin 11/08/2013 0.4  0.3 - 1.2 mg/dL Final  . GFR calc non Af Amer 11/08/2013 61* >90 mL/min Final  . GFR  calc Af Amer 11/08/2013 71* >90 mL/min Final   Comment:  (NOTE)                          The eGFR has been calculated using the CKD EPI equation.                          This calculation has not been validated in all clinical situations.                          eGFR's persistently <90 mL/min signify possible Chronic Kidney                          Disease.  . Anion gap 11/08/2013 11  5 - 15 Final  . LDH 11/08/2013 251* 94 - 250 U/L Final  . Ferritin 11/08/2013 104  22 - 322 ng/mL Final   Performed at [facility]  Anti-coag visit on 10/31/2013  Component Date Value Ref Range Status  . INR 10/31/2013 2.3   Final  Anti-coag visit on 10/20/2013  Component Date Value Ref Range Status  . INR 10/20/2013 2.2   Final    PATHOLOGY: No new pathology.  Urinalysis    Component Value Date/Time   COLORURINE YELLOW 07/06/2013 1855   APPEARANCEUR CLEAR 07/06/2013 1855   LABSPEC 1.010 07/06/2013 1855   PHURINE 5.0 07/06/2013 1855   GLUCOSEU NEGATIVE 07/06/2013 1855   HGBUR NEGATIVE 07/06/2013 1855   BILIRUBINUR NEGATIVE 07/06/2013 1855   KETONESUR NEGATIVE 07/06/2013 1855   PROTEINUR NEGATIVE 07/06/2013 1855   UROBILINOGEN 0.2 07/06/2013 1855   NITRITE NEGATIVE 07/06/2013 1855   LEUKOCYTESUR NEGATIVE 07/06/2013 1855    RADIOGRAPHIC STUDIES: Ct Soft Tissue Neck W Contrast  11/14/2013   CLINICAL DATA:  Knot on the right side of the neck. History of follicular lymphoma status post chemotherapy.  EXAM: CT OF THE NECK WITH CONTRAST  CT OF THE CHEST WITH CONTRAST  CT OF THE ABDOMEN AND PELVIS WITH CONTRAST  TECHNIQUE: Multidetector CT imaging of the neck was performed with intravenous contrast.; Multidetector CT imaging of the abdomen and pelvis was performed following the standard protocol during bolus administration of intravenous contrast.; Multidetector CT imaging of the chest was performed following the standard protocol during bolus administration of intravenous contrast.  CONTRAST:  123m OMNIPAQUE IOHEXOL 300 MG/ML  SOLN  COMPARISON:  CT of the chest,  abdomen and pelvis 07/06/2013. CT cervical spine 08/06/2013. CT neck 06/06/2010.  FINDINGS: CT NECK FINDINGS  Multiple enlarged right-sided lymph nodes. Specific examples include a right level IIb lymph node measuring 2.1 x 1.7 cm (image 41 of series 2), a right level IIa lymph node measuring 2.6 x 1.8 cm (image 45 of series 2), another right level IIa lymph node measuring 1.9 x 1.6 cm (image 58 of series 2), a right level III lymph node measuring 1.8 x 1.6 cm (image 82 of series 2), and a right level IV lymph node measuring 1.6 x 1.2 cm (image 87 of series 2). No left-sided cervical lymphadenopathy is identified. Evaluation of the lower cervical nodal stations and supraclavicular region on the right side is significantly limited by a large amount of beam hardening artifact from the patient's right-sided shoulder arthroplasty. The cervical spine is again remarkable for severe multilevel degenerative disc disease, most evident at C5-C6 and C6-C7, which results in a focal  kyphotic deformity of the cervical spine at this level. There is also mild multilevel facet arthropathy. These findings are very similar to prior CT of the cervical spine 08/06/2013.  CT CHEST FINDINGS  Mediastinum: Heart size is moderately enlarged with severe left atrial dilatation. There is no significant pericardial fluid, thickening or pericardial calcification. There is atherosclerosis of the thoracic aorta, the great vessels of the mediastinum and the coronary arteries, including calcified atherosclerotic plaque in the left anterior descending and left circumflex coronary arteries. No pathologically enlarged mediastinal or hilar lymph nodes. Esophagus is unremarkable in appearance.  Lungs/Pleura: No suspicious appearing pulmonary nodules or masses. No acute consolidative airspace disease. No pleural effusions. A few scattered tiny clustered areas of peribronchovascular micronodularity are noted, most evident in the lateral segment of the right  middle lobe, most compatible with areas of chronic mucoid impaction within terminal bronchioles.  Musculoskeletal: Status post right shoulder arthroplasty. There are no aggressive appearing lytic or blastic lesions noted in the visualized portions of the skeleton.  CT ABDOMEN AND PELVIS FINDINGS  Abdomen/Pelvis: The appearance of the liver, gallbladder, pancreas, spleen, bilateral adrenal glands and the right kidney is unremarkable. Well-defined low-attenuation lesions in the left kidney are noted, measuring up to 2.4 cm in diameter, compatible with simple cysts. No significant volume of ascites. No pneumoperitoneum. No pathologic distention of small bowel. No lymphadenopathy identified in the abdomen or pelvis. Large left inguinal hernia containing a portion of the proximal sigmoid colon, without evidence of incarceration or obstruction at this time. Extensive atherosclerosis throughout the abdominal and pelvic vasculature, without evidence of aneurysm or dissection. Urinary bladder is unremarkable in appearance.  Musculoskeletal: There are no aggressive appearing lytic or blastic lesions noted in the visualized portions of the skeleton.  IMPRESSION: 1. Bulky right-sided cervical lymphadenopathy, as detailed above, concerning for recurrence of disease. 2. However, compared to prior CT of the chest, abdomen and pelvis 07/06/2013, the previously noted mediastinal and hilar lymphadenopathy has resolved, as have the previously noted bilateral pleural effusions. 3. Cardiomegaly with severe left atrial dilatation redemonstrated. 4. Large left inguinal hernia containing a short segment of the sigmoid colon. This has significantly increased in size compared to the prior study. No associated bowel incarceration or obstruction at this time. 5. Extensive atherosclerosis including 2 vessel coronary artery disease. 6. Additional incidental findings, as above.   Electronically Signed   By: Vinnie Langton M.D.   On: 11/14/2013  12:30   Ct Chest W Contrast  11/14/2013   CLINICAL DATA:  Knot on the right side of the neck. History of follicular lymphoma status post chemotherapy.  EXAM: CT OF THE NECK WITH CONTRAST  CT OF THE CHEST WITH CONTRAST  CT OF THE ABDOMEN AND PELVIS WITH CONTRAST  TECHNIQUE: Multidetector CT imaging of the neck was performed with intravenous contrast.; Multidetector CT imaging of the abdomen and pelvis was performed following the standard protocol during bolus administration of intravenous contrast.; Multidetector CT imaging of the chest was performed following the standard protocol during bolus administration of intravenous contrast.  CONTRAST:  164m OMNIPAQUE IOHEXOL 300 MG/ML  SOLN  COMPARISON:  CT of the chest, abdomen and pelvis 07/06/2013. CT cervical spine 08/06/2013. CT neck 06/06/2010.  FINDINGS: CT NECK FINDINGS  Multiple enlarged right-sided lymph nodes. Specific examples include a right level IIb lymph node measuring 2.1 x 1.7 cm (image 41 of series 2), a right level IIa lymph node measuring 2.6 x 1.8 cm (image 45 of series 2), another right level IIa lymph  node measuring 1.9 x 1.6 cm (image 58 of series 2), a right level III lymph node measuring 1.8 x 1.6 cm (image 82 of series 2), and a right level IV lymph node measuring 1.6 x 1.2 cm (image 87 of series 2). No left-sided cervical lymphadenopathy is identified. Evaluation of the lower cervical nodal stations and supraclavicular region on the right side is significantly limited by a large amount of beam hardening artifact from the patient's right-sided shoulder arthroplasty. The cervical spine is again remarkable for severe multilevel degenerative disc disease, most evident at C5-C6 and C6-C7, which results in a focal kyphotic deformity of the cervical spine at this level. There is also mild multilevel facet arthropathy. These findings are very similar to prior CT of the cervical spine 08/06/2013.  CT CHEST FINDINGS  Mediastinum: Heart size is  moderately enlarged with severe left atrial dilatation. There is no significant pericardial fluid, thickening or pericardial calcification. There is atherosclerosis of the thoracic aorta, the great vessels of the mediastinum and the coronary arteries, including calcified atherosclerotic plaque in the left anterior descending and left circumflex coronary arteries. No pathologically enlarged mediastinal or hilar lymph nodes. Esophagus is unremarkable in appearance.  Lungs/Pleura: No suspicious appearing pulmonary nodules or masses. No acute consolidative airspace disease. No pleural effusions. A few scattered tiny clustered areas of peribronchovascular micronodularity are noted, most evident in the lateral segment of the right middle lobe, most compatible with areas of chronic mucoid impaction within terminal bronchioles.  Musculoskeletal: Status post right shoulder arthroplasty. There are no aggressive appearing lytic or blastic lesions noted in the visualized portions of the skeleton.  CT ABDOMEN AND PELVIS FINDINGS  Abdomen/Pelvis: The appearance of the liver, gallbladder, pancreas, spleen, bilateral adrenal glands and the right kidney is unremarkable. Well-defined low-attenuation lesions in the left kidney are noted, measuring up to 2.4 cm in diameter, compatible with simple cysts. No significant volume of ascites. No pneumoperitoneum. No pathologic distention of small bowel. No lymphadenopathy identified in the abdomen or pelvis. Large left inguinal hernia containing a portion of the proximal sigmoid colon, without evidence of incarceration or obstruction at this time. Extensive atherosclerosis throughout the abdominal and pelvic vasculature, without evidence of aneurysm or dissection. Urinary bladder is unremarkable in appearance.  Musculoskeletal: There are no aggressive appearing lytic or blastic lesions noted in the visualized portions of the skeleton.  IMPRESSION: 1. Bulky right-sided cervical  lymphadenopathy, as detailed above, concerning for recurrence of disease. 2. However, compared to prior CT of the chest, abdomen and pelvis 07/06/2013, the previously noted mediastinal and hilar lymphadenopathy has resolved, as have the previously noted bilateral pleural effusions. 3. Cardiomegaly with severe left atrial dilatation redemonstrated. 4. Large left inguinal hernia containing a short segment of the sigmoid colon. This has significantly increased in size compared to the prior study. No associated bowel incarceration or obstruction at this time. 5. Extensive atherosclerosis including 2 vessel coronary artery disease. 6. Additional incidental findings, as above.   Electronically Signed   By: Vinnie Langton M.D.   On: 11/14/2013 12:30   Ct Abdomen Pelvis W Contrast  11/14/2013   CLINICAL DATA:  Knot on the right side of the neck. History of follicular lymphoma status post chemotherapy.  EXAM: CT OF THE NECK WITH CONTRAST  CT OF THE CHEST WITH CONTRAST  CT OF THE ABDOMEN AND PELVIS WITH CONTRAST  TECHNIQUE: Multidetector CT imaging of the neck was performed with intravenous contrast.; Multidetector CT imaging of the abdomen and pelvis was performed following the  standard protocol during bolus administration of intravenous contrast.; Multidetector CT imaging of the chest was performed following the standard protocol during bolus administration of intravenous contrast.  CONTRAST:  141m OMNIPAQUE IOHEXOL 300 MG/ML  SOLN  COMPARISON:  CT of the chest, abdomen and pelvis 07/06/2013. CT cervical spine 08/06/2013. CT neck 06/06/2010.  FINDINGS: CT NECK FINDINGS  Multiple enlarged right-sided lymph nodes. Specific examples include a right level IIb lymph node measuring 2.1 x 1.7 cm (image 41 of series 2), a right level IIa lymph node measuring 2.6 x 1.8 cm (image 45 of series 2), another right level IIa lymph node measuring 1.9 x 1.6 cm (image 58 of series 2), a right level III lymph node measuring 1.8 x 1.6 cm  (image 82 of series 2), and a right level IV lymph node measuring 1.6 x 1.2 cm (image 87 of series 2). No left-sided cervical lymphadenopathy is identified. Evaluation of the lower cervical nodal stations and supraclavicular region on the right side is significantly limited by a large amount of beam hardening artifact from the patient's right-sided shoulder arthroplasty. The cervical spine is again remarkable for severe multilevel degenerative disc disease, most evident at C5-C6 and C6-C7, which results in a focal kyphotic deformity of the cervical spine at this level. There is also mild multilevel facet arthropathy. These findings are very similar to prior CT of the cervical spine 08/06/2013.  CT CHEST FINDINGS  Mediastinum: Heart size is moderately enlarged with severe left atrial dilatation. There is no significant pericardial fluid, thickening or pericardial calcification. There is atherosclerosis of the thoracic aorta, the great vessels of the mediastinum and the coronary arteries, including calcified atherosclerotic plaque in the left anterior descending and left circumflex coronary arteries. No pathologically enlarged mediastinal or hilar lymph nodes. Esophagus is unremarkable in appearance.  Lungs/Pleura: No suspicious appearing pulmonary nodules or masses. No acute consolidative airspace disease. No pleural effusions. A few scattered tiny clustered areas of peribronchovascular micronodularity are noted, most evident in the lateral segment of the right middle lobe, most compatible with areas of chronic mucoid impaction within terminal bronchioles.  Musculoskeletal: Status post right shoulder arthroplasty. There are no aggressive appearing lytic or blastic lesions noted in the visualized portions of the skeleton.  CT ABDOMEN AND PELVIS FINDINGS  Abdomen/Pelvis: The appearance of the liver, gallbladder, pancreas, spleen, bilateral adrenal glands and the right kidney is unremarkable. Well-defined  low-attenuation lesions in the left kidney are noted, measuring up to 2.4 cm in diameter, compatible with simple cysts. No significant volume of ascites. No pneumoperitoneum. No pathologic distention of small bowel. No lymphadenopathy identified in the abdomen or pelvis. Large left inguinal hernia containing a portion of the proximal sigmoid colon, without evidence of incarceration or obstruction at this time. Extensive atherosclerosis throughout the abdominal and pelvic vasculature, without evidence of aneurysm or dissection. Urinary bladder is unremarkable in appearance.  Musculoskeletal: There are no aggressive appearing lytic or blastic lesions noted in the visualized portions of the skeleton.  IMPRESSION: 1. Bulky right-sided cervical lymphadenopathy, as detailed above, concerning for recurrence of disease. 2. However, compared to prior CT of the chest, abdomen and pelvis 07/06/2013, the previously noted mediastinal and hilar lymphadenopathy has resolved, as have the previously noted bilateral pleural effusions. 3. Cardiomegaly with severe left atrial dilatation redemonstrated. 4. Large left inguinal hernia containing a short segment of the sigmoid colon. This has significantly increased in size compared to the prior study. No associated bowel incarceration or obstruction at this time. 5. Extensive atherosclerosis including  2 vessel coronary artery disease. 6. Additional incidental findings, as above.   Electronically Signed   By: Vinnie Langton M.D.   On: 11/14/2013 12:30    ASSESSMENT:  #1. 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 #2. Right inguinal hernia in need of repair. #3. Right lower jaw cracked tooth, requiring extraction #4. Systolic congestive heart failure, on therapy, compensated. #5. Chronic atrial fibrillation. #6. Chronic obstructive pulmonary disease.  PLAN:  #1. Coordinate  dental extraction and inguinal hernia repair. #2. Two days prior to dental extraction, will give vitamin K 5 mg intramuscularly. INR will normalize. #3. Proceed with dental extraction and 2 with 3 days later, perform inguinal lymph node repair. #4. Postoperatively, will begin Xarelto 15 mg twice a day for 21 days followed by 20 mg daily-- no longer requiring INR coverage. Coagulation will be well controlled by inhibiting factor X instead of antagonizing  vitamin K. No need for attention to diet or other drugs causing alterations in anticoagulant efficacy or toxicity. Will make sure insurance will cover. #5. Await date dental extraction and inguinal hernia repair so that vitamin K can be given shortly before and so the procedures can be performed safely.   All questions were answered. The patient knows to call the clinic with any problems, questions or concerns. We can certainly see the patient much sooner if necessary.   I spent 30 minutes counseling the patient face to face. The total time spent in the appointment was 40 minutes.    Doroteo Bradford, MD 11/18/2013 6:50 AM  DISCLAIMER:  This note was dictated with voice recognition software.  Similar sounding words can inadvertently be transcribed inaccurately and may not be corrected upon review.

## 2013-11-17 NOTE — Progress Notes (Signed)
Charles [name]'s reason for visit today is for labs as scheduled per MD orders.  Venipuncture performed with a 23 gauge butterfly needle to R Antecubital.  Charles [name] tolerated procedure well and without incident; questions were answered and patient was discharged.

## 2013-11-17 NOTE — Patient Instructions (Addendum)
[facility] Discharge Instructions  RECOMMENDATIONS MADE BY THE CONSULTANT AND ANY TEST RESULTS WILL BE SENT TO YOUR REFERRING PHYSICIAN.  EXAM FINDINGS BY THE PHYSICIAN TODAY AND SIGNS OR SYMPTOMS TO REPORT TO CLINIC OR PRIMARY PHYSICIAN: you seen dr Barnet Glasgow today  Coordinate with surgeon and dentist to have plan.  Call to have Vit K shot 3 days prior.  Xarelto prescription given.  3 weeks follow up with lab work and doctors appt.  Thank you for choosing [facility] to provide your oncology and hematology care.  To afford each patient quality time with our providers, please arrive at least 15 minutes before your scheduled appointment time.  With your help, our goal is to use those 15 minutes to complete the necessary work-up to ensure our physicians have the information they need to help with your evaluation and healthcare recommendations.    Effective January 1st, 2014, we ask that you re-schedule your appointment with our physicians should you arrive 10 or more minutes late for your appointment.  We strive to give you quality time with our providers, and arriving late affects you and other patients whose appointments are after yours.    Again, thank you for choosing [facility].  Our hope is that these requests will decrease the amount of time that you wait before being seen by our physicians.       _____________________________________________________________  Should you have questions after your visit to [facility], please contact our office at [phone] between the hours of 8:30 a.m. and 5:00 p.m.  Voicemails left after 4:30 p.m. will not be returned until the following business day.  For prescription refill requests, have your pharmacy contact our office with your prescription refill request.

## 2013-11-18 ENCOUNTER — Other Ambulatory Visit (HOSPITAL_COMMUNITY): Payer: Self-pay | Admitting: Hematology and Oncology

## 2013-11-24 ENCOUNTER — Ambulatory Visit (INDEPENDENT_AMBULATORY_CARE_PROVIDER_SITE_OTHER): Payer: Medicare Other | Admitting: Cardiology

## 2013-11-24 ENCOUNTER — Encounter: Payer: Self-pay | Admitting: Cardiology

## 2013-11-24 ENCOUNTER — Ambulatory Visit (INDEPENDENT_AMBULATORY_CARE_PROVIDER_SITE_OTHER): Payer: Medicare Other | Admitting: Pharmacist

## 2013-11-24 VITALS — BP 122/72 | HR 68 | Ht 67.0 in | Wt 186.0 lb

## 2013-11-24 DIAGNOSIS — I482 Chronic atrial fibrillation, unspecified: Secondary | ICD-10-CM

## 2013-11-24 DIAGNOSIS — I428 Other cardiomyopathies: Secondary | ICD-10-CM

## 2013-11-24 DIAGNOSIS — I10 Essential (primary) hypertension: Secondary | ICD-10-CM

## 2013-11-24 DIAGNOSIS — I4891 Unspecified atrial fibrillation: Secondary | ICD-10-CM

## 2013-11-24 DIAGNOSIS — Z7901 Long term (current) use of anticoagulants: Secondary | ICD-10-CM

## 2013-11-24 DIAGNOSIS — Z01810 Encounter for preprocedural cardiovascular examination: Secondary | ICD-10-CM

## 2013-11-24 DIAGNOSIS — Z5181 Encounter for therapeutic drug level monitoring: Secondary | ICD-10-CM

## 2013-11-24 LAB — POCT INR: INR: 1.8

## 2013-11-24 NOTE — Assessment & Plan Note (Signed)
Patient remains stable from a cardiac perspective on current regimen. He is cleared to proceed with both dental extraction and hernia surgery pending later this month at an acceptable perioperative cardiac risk. Coumadin will be held for these procedures, it sounds like he is later to switch to Xarelto per Dr. Barnet Glasgow. Would otherwise continue his present cardiac regimen. Followup arranged.

## 2013-11-24 NOTE — Assessment & Plan Note (Signed)
Continue strategies heart rate control and anticoagulation.

## 2013-11-24 NOTE — Patient Instructions (Signed)
Your physician recommends that you schedule a follow-up appointment in: 3 months   Your physician recommends that you continue on your current medications as directed. Please refer to the Current Medication list given to you today.   Have INR done done today by [facility] pharmacist in coumadin clinic     Thank you for choosing [facility] !

## 2013-11-24 NOTE — Assessment & Plan Note (Signed)
Blood pressure is normal today. 

## 2013-11-24 NOTE — Assessment & Plan Note (Signed)
LVEF 35-40% with grade 2 diastolic dysfunction.

## 2013-11-24 NOTE — Progress Notes (Signed)
Clinical Summary Charles [name] is a 73 y.o.male last seen in June. He presents for a routine visit. He will be having a tooth extraction later in the month, followed by hernia surgery. He continues on Coumadin, however in reviewing the notes I see that he will be switched to Xarelto per Dr. eventually. He is due for an INR today.  He reports no palpitations, no recurrent leg edema on current diuretic dose.  Patient continues to follow with oncology, has a history of lymphoma and prior chemotherapy. Labwork from mid-August should potassium 4.0, BUN 28, creatinine 1.1, normal LFTs, hemoglobin 12.6, platelets 238.  Followup echocardiogram in March of this year demonstrated mild LVH with LVEF 35-40%, inferolateral hypokinesis, grade 2 diastolic dysfunction, severe left atrial enlargement, MAC with thickened mitral leaflets and moderate mitral regurgitation, sclerotic aortic valve, trivial tricuspid regurgitation, unable to assess PASP.   Allergies  Allergen Reactions  . Metoprolol Shortness Of Breath    Intolerant to all BB  . Statins     Intolerant secondary to severe myalgias    Current Outpatient Prescriptions  Medication Sig Dispense Refill  . ALPRAZolam (XANAX) 1 MG tablet Take 0.5 mg by mouth 3 (three) times daily.       . cholecalciferol (VITAMIN D) 1000 UNITS tablet Take 1,000 Units by mouth every morning.      . digoxin (LANOXIN) 0.125 MG tablet Take 0.125 mg by mouth every morning.      . diltiazem (CARDIZEM CD) 180 MG 24 hr capsule Take 1 capsule (180 mg total) by mouth daily.  90 capsule  3  . docusate sodium (COLACE) 50 MG capsule Take 50-100 mg by mouth every morning.       . furosemide (LASIX) 80 MG tablet Take 80 mg by mouth 2 (two) times daily.      Marland Kitchen HYDROcodone-acetaminophen (NORCO) 10-325 MG per tablet Take 1 tablet by mouth every 6 (six) hours as needed for moderate pain.      Marland Kitchen lovastatin (MEVACOR) 10 MG tablet Take 10 mg by mouth at bedtime.       Marland Kitchen omeprazole  (PRILOSEC) 20 MG capsule Take 20 mg by mouth every morning.       . polyethylene glycol (MIRALAX / GLYCOLAX) packet Take 17 g by mouth daily as needed for mild constipation or moderate constipation.       . potassium chloride SA (K-DUR,KLOR-CON) 20 MEQ tablet Take 20 mEq by mouth 2 (two) times daily.      Marland Kitchen PROAIR HFA 108 (90 BASE) MCG/ACT inhaler Inhale 1 puff into the lungs every 6 (six) hours as needed. For shortness of breath      . sertraline (ZOLOFT) 50 MG tablet Take 50 mg by mouth at bedtime.      . Simethicone (PHAZYME PO) Take 1 capsule by mouth daily as needed (for gas relief/stomach symptoms).      . tiotropium (SPIRIVA) 18 MCG inhalation capsule Place 18 mcg into inhaler and inhale daily as needed.      . traMADol (ULTRAM) 50 MG tablet Take 50 mg by mouth every 6 (six) hours as needed.      . warfarin (COUMADIN) 5 MG tablet Take 2.5-5 mg by mouth daily. Take 1/2 tablet (2.5mg ) on Mon, Wed, Fri, Sun and 1 tab (5mg ) on Tues, Thurs, Sat.       No current facility-administered medications for this visit.   Facility-Administered Medications Ordered in Other Visits  Medication Dose Route Frequency Provider Last Rate Last  Dose  . sodium chloride 0.9 % injection 10 mL  10 mL Intracatheter PRN Pieter Partridge, MD   10 mL at 10/15/10 1230    Past Medical History  Diagnosis Date  . Essential hypertension, benign   . Atrial fibrillation 10/2008  . 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 Peptic ulcer disease   . Degenerative joint disease     Knees and shoulders  . Pneumonia 2010  . Allergic rhinitis   . Anxiety   . Drug abuse   . Tobacco abuse     50 pack years  . Mitral regurgitation     Moderate to severe May 2014  . COPD (chronic obstructive pulmonary disease)   . 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 Basal cell carcinoma   . Hernia, inguinal, left   . Arm fracture, right 07/2013  . Hernia, inguinal, left     Social  History Charles [name] reports that he quit smoking about 19 months ago. His smoking use included Cigarettes. He has a 50 pack-year smoking history. He has never used smokeless tobacco. Charles [name] reports that he does not drink alcohol.  Review of Systems Other systems reviewed and negative except as outlined.  Physical Examination Filed Vitals:   11/24/13 1030  BP: 122/72  Pulse: 68   Filed Weights   11/24/13 1030  Weight: 186 lb (84.369 kg)    Chronically ill-appearing male in no distress.  HEENT: Conjunctiva and lids normal, oropharynx clear.  Neck: Supple, no elevated JVP or carotid bruits, no thyromegaly.  Lungs: Clear to auscultation, decreased in bases, nonlabored breathing at rest.  Cardiac: Irregularly irregular, no S3, apical systolic murmur, no pericardial rub.  Abdomen: Soft, nontender, bowel sounds present, no guarding or rebound.  Extremities: Trace ankle edema, distal pulses 2+. Right arm in cast and sling.  Skin: Warm and dry.  Musculoskeletal: No kyphosis.  Neuropsychiatric: Alert and oriented x3, flat affect .   Problem List and Plan   Preoperative cardiovascular examination Patient remains stable from a cardiac perspective on current regimen. He is cleared to proceed with both dental extraction and hernia surgery pending later this month at an acceptable perioperative cardiac risk. Coumadin will be held for these procedures, it sounds like he is later to switch to Xarelto per Dr. Barnet Glasgow. Would otherwise continue his present cardiac regimen. Followup arranged.  Essential hypertension, benign Blood pressure is normal today.  Nonischemic cardiomyopathy LVEF 35-40% with grade 2 diastolic dysfunction.  Atrial fibrillation Continue strategies heart rate control and anticoagulation.    Satira Sark, M.D., F.A.C.C.

## 2013-11-29 ENCOUNTER — Other Ambulatory Visit (HOSPITAL_COMMUNITY): Payer: Medicare Other

## 2013-11-29 ENCOUNTER — Ambulatory Visit (HOSPITAL_COMMUNITY): Payer: Medicare Other | Admitting: Oncology

## 2013-11-29 ENCOUNTER — Ambulatory Visit (HOSPITAL_COMMUNITY): Payer: Medicare Other

## 2013-11-30 NOTE — Progress Notes (Signed)
This encounter was created in error - please disregard.

## 2013-12-02 ENCOUNTER — Encounter (HOSPITAL_COMMUNITY): Payer: Self-pay | Admitting: Pharmacy Technician

## 2013-12-08 ENCOUNTER — Encounter (HOSPITAL_COMMUNITY)

## 2013-12-08 ENCOUNTER — Encounter (HOSPITAL_COMMUNITY): Payer: Self-pay

## 2013-12-08 ENCOUNTER — Encounter (HOSPITAL_COMMUNITY): Attending: Oncology

## 2013-12-08 VITALS — BP 127/73 | HR 53 | Temp 97.8°F | Resp 15 | Wt 189.0 lb

## 2013-12-08 DIAGNOSIS — Z888 Allergy status to other drugs, medicaments and biological substances status: Secondary | ICD-10-CM | POA: Insufficient documentation

## 2013-12-08 DIAGNOSIS — Z7901 Long term (current) use of anticoagulants: Secondary | ICD-10-CM | POA: Insufficient documentation

## 2013-12-08 DIAGNOSIS — I428 Other cardiomyopathies: Secondary | ICD-10-CM | POA: Insufficient documentation

## 2013-12-08 DIAGNOSIS — C8290 Follicular lymphoma, unspecified, unspecified site: Secondary | ICD-10-CM

## 2013-12-08 DIAGNOSIS — D509 Iron deficiency anemia, unspecified: Secondary | ICD-10-CM | POA: Insufficient documentation

## 2013-12-08 DIAGNOSIS — M159 Polyosteoarthritis, unspecified: Secondary | ICD-10-CM | POA: Insufficient documentation

## 2013-12-08 DIAGNOSIS — Z9889 Other specified postprocedural states: Secondary | ICD-10-CM | POA: Insufficient documentation

## 2013-12-08 DIAGNOSIS — F17200 Nicotine dependence, unspecified, uncomplicated: Secondary | ICD-10-CM | POA: Insufficient documentation

## 2013-12-08 DIAGNOSIS — K279 Peptic ulcer, site unspecified, unspecified as acute or chronic, without hemorrhage or perforation: Secondary | ICD-10-CM | POA: Insufficient documentation

## 2013-12-08 DIAGNOSIS — I10 Essential (primary) hypertension: Secondary | ICD-10-CM | POA: Insufficient documentation

## 2013-12-08 DIAGNOSIS — R599 Enlarged lymph nodes, unspecified: Secondary | ICD-10-CM | POA: Insufficient documentation

## 2013-12-08 DIAGNOSIS — I482 Chronic atrial fibrillation, unspecified: Secondary | ICD-10-CM

## 2013-12-08 DIAGNOSIS — I509 Heart failure, unspecified: Secondary | ICD-10-CM | POA: Insufficient documentation

## 2013-12-08 DIAGNOSIS — K083 Retained dental root: Secondary | ICD-10-CM | POA: Insufficient documentation

## 2013-12-08 DIAGNOSIS — S42309D Unspecified fracture of shaft of humerus, unspecified arm, subsequent encounter for fracture with routine healing: Secondary | ICD-10-CM | POA: Insufficient documentation

## 2013-12-08 DIAGNOSIS — L270 Generalized skin eruption due to drugs and medicaments taken internally: Secondary | ICD-10-CM | POA: Insufficient documentation

## 2013-12-08 DIAGNOSIS — I2510 Atherosclerotic heart disease of native coronary artery without angina pectoris: Secondary | ICD-10-CM | POA: Insufficient documentation

## 2013-12-08 DIAGNOSIS — J309 Allergic rhinitis, unspecified: Secondary | ICD-10-CM | POA: Insufficient documentation

## 2013-12-08 DIAGNOSIS — K4091 Unilateral inguinal hernia, without obstruction or gangrene, recurrent: Secondary | ICD-10-CM

## 2013-12-08 DIAGNOSIS — E785 Hyperlipidemia, unspecified: Secondary | ICD-10-CM | POA: Insufficient documentation

## 2013-12-08 DIAGNOSIS — I4891 Unspecified atrial fibrillation: Secondary | ICD-10-CM | POA: Insufficient documentation

## 2013-12-08 DIAGNOSIS — F411 Generalized anxiety disorder: Secondary | ICD-10-CM | POA: Insufficient documentation

## 2013-12-08 DIAGNOSIS — I5042 Chronic combined systolic (congestive) and diastolic (congestive) heart failure: Secondary | ICD-10-CM | POA: Insufficient documentation

## 2013-12-08 DIAGNOSIS — J449 Chronic obstructive pulmonary disease, unspecified: Secondary | ICD-10-CM | POA: Insufficient documentation

## 2013-12-08 DIAGNOSIS — Z79899 Other long term (current) drug therapy: Secondary | ICD-10-CM | POA: Insufficient documentation

## 2013-12-08 DIAGNOSIS — I059 Rheumatic mitral valve disease, unspecified: Secondary | ICD-10-CM | POA: Insufficient documentation

## 2013-12-08 DIAGNOSIS — C8299 Follicular lymphoma, unspecified, extranodal and solid organ sites: Secondary | ICD-10-CM | POA: Insufficient documentation

## 2013-12-08 DIAGNOSIS — J4489 Other specified chronic obstructive pulmonary disease: Secondary | ICD-10-CM | POA: Insufficient documentation

## 2013-12-08 NOTE — Progress Notes (Signed)
[facility]  OFFICE PROGRESS NOTE  [name], MD [address] [place] [address]  DIAGNOSIS: Follicular lymphoma  Chronic atrial fibrillation  Unilateral recurrent inguinal hernia without obstruction or gangrene  Chief Complaint  Patient presents with  . Lymphoma  . Inguinal Hernia    CURRENT THERAPY: Warfarin for chronic atrial fibrillation, discontinued today.  INTERVAL HISTORY: Charles [name] 73 y.o. male returns for followup after being treated with 6 cycles of bendamustine and Rituxan on 11/05/2010 followed by 4 maintenance Rituxan infusions stopping after the fourth infusion on 05/07/863 due to complications with PET scan done in April 2014 showing no evidence of lymphoma. He recently was found to have a left inguinal hernia with some difficulty in adjusting the patient's INR prior to surgery. Right neck lymph nodes were felt to be enlarged and he was seen on 11/06/2013 with repeat CT scans of the neck, chest, abdomen, and pelvis  performed.  There was evidence of lymphadenopathy in the right neck on CT scan. Rest of his scans are normal. At the time of his last visit on 11/17/2013 he develop a cracked tooth which the dentist is unwilling to remove with an abnormal INR. Stopping warfarin for 3 days prior to his anticipated dental extraction produced no improvement in his INR. At the time of his last visit he was instructed to notify this office regarding the date of his dental extraction and anticipated inguinal hernia repair at which time of core biopsy of the right lymph node could also be done. He was told to stop warfarin for 4 days prior to the anticipated tooth extraction and to remain off warfarin until the inguinal surgery and core biopsy of the neck was completed. He is apparently scheduled for inguinal hernia surgery on 12/16/2013. After inguinal surgery he will be started on Xarelto henceforth. He stopped warfarin  today. He is to get her repeat prothrombin time done on the morning of 12/12/2013 and takes result to his surgeon as well as dentist who will perform tooth extraction at 1 PM on 12/12/2013. He is scheduled for hernia repair on 12/16/2013 at which time core biopsy of his right neck lymph node should also be done.  He did have one episode of drenching night sweats about one week ago. He denies any chest pain, shortness of breath, PND, orthopnea, lower extremity swelling or redness, epistaxis, melena, hematochezia, hematuria, or oral pain.    MEDICAL HISTORY: Past Medical History  Diagnosis Date  . Essential hypertension, benign   . Atrial fibrillation 10/2008  . 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 Peptic ulcer disease   . Degenerative joint disease     Knees and shoulders  . Pneumonia 2010  . Allergic rhinitis   . Anxiety   . Drug abuse   . Tobacco abuse     50 pack years  . Mitral regurgitation     Moderate to severe May 2014  . COPD (chronic obstructive pulmonary disease)   . Follicular lymphoma 09/29/4694  . Basal cell carcinoma   . Hernia, inguinal, left   . Arm fracture, right 07/2013  . Hernia, inguinal, left     INTERIM HISTORY: has TOBACCO ABUSE; Essential hypertension, benign; Atrial fibrillation; Chronic anticoagulation; Follicular lymphoma; Hyperlipidemia; Chronic combined systolic and diastolic CHF (congestive heart failure); Nonischemic cardiomyopathy; Mitral regurgitation; OA (osteoarthritis) of knee; Encounter for therapeutic drug monitoring; and  Preoperative cardiovascular examination on his problem list.    ALLERGIES:  is allergic to metoprolol and statins.  MEDICATIONS: has a current medication list which includes the following prescription(s): alprazolam, cholecalciferol, digoxin, diltiazem, docusate sodium, furosemide, hydrocodone-acetaminophen, lovastatin, omeprazole, polyethylene glycol, potassium chloride  sa, proair hfa, sertraline, simethicone, tiotropium, tramadol, and warfarin, and the following Facility-Administered Medications: sodium chloride.  SURGICAL HISTORY:  Past Surgical History  Procedure Laterality Date  . Neck lesion biopsy  June 23, 8248    Follicular lymphoma  . Skin cancer excision      under left breast; variously described as melanoma and basal cell  . Rotator cuff repair      right  . Patella fracture surgery Left 1975  . Hematoma evacuation      after melanoma removal  . Portacath placement  07/12/2010  . Colonoscopy  2009    Also underwent an EGD; patient reports no significant findings  . Bone marrow aspiration  06/2012  . Bone marrow biopsy  06/2012  . Fungal infection    . Port-a-cath removal Left 01/17/2013    Procedure: REMOVAL PORT-A-CATH;  Surgeon: Scherry Ran, MD;  Location: [facility];  Service: General;  Laterality: Left;    FAMILY HISTORY: family history includes Coronary artery disease in his other; Heart attack in his father.  SOCIAL HISTORY:  reports that he quit smoking about 19 months ago. His smoking use included Cigarettes. He has a 50 pack-year smoking history. He has never used smokeless tobacco. He reports that he does not drink alcohol or use illicit drugs.  REVIEW OF SYSTEMS:  Other than that discussed above is noncontributory.  PHYSICAL EXAMINATION: ECOG PERFORMANCE STATUS: 1 - Symptomatic but completely ambulatory  Blood pressure 127/73, pulse 53, temperature 97.8 F (36.6 C), temperature source Oral, resp. rate 15, weight 189 lb (85.73 kg), SpO2 99.00%.  GENERAL:alert, no distress and comfortable SKIN: skin color, texture, turgor are normal, no rashes or significant lesions EYES: PERLA; Conjunctiva are pink and non-injected, sclera clear SINUSES: No redness or tenderness over maxillary or ethmoid sinuses OROPHARYNX:no exudate, no erythema on lips, buccal mucosa, or tongue. NECK: supple, thyroid normal size, non-tender, without  nodularity. No masses CHEST: Increased AP diameter with no breast masses. LYMPH:  Right submental lymph node measuring 1-1/2 cm as palpable, nontender, non-fixed, and soft. LUNGS: clear to auscultation and percussion with normal breathing effort HEART: Irregular irregular with no S3. ABDOMEN:abdomen soft, non-tender and normal bowel sounds MUSCULOSKELETAL:no cyanosis of digits and no clubbing. Range of motion normal.  NEURO: alert & oriented x 3 with fluent speech, no focal motor/sensory deficits   LABORATORY DATA: Anti-coag visit on 11/24/2013  Component Date Value Ref Range Status  . INR 11/24/2013 1.8   Final  Office Visit on 11/17/2013  Component Date Value Ref Range Status  . Prothrombin Time 11/17/2013 26.6* 11.6 - 15.2 seconds Final  . INR 11/17/2013 2.45* 0.00 - 1.49 Final  Anti-coag visit on 11/10/2013  Component Date Value Ref Range Status  . INR 11/10/2013 1.9   Final    PATHOLOGY: No new pathology.  Urinalysis    Component Value Date/Time   COLORURINE YELLOW 07/06/2013 1855   APPEARANCEUR CLEAR 07/06/2013 1855   LABSPEC 1.010 07/06/2013 1855   PHURINE 5.0 07/06/2013 1855   GLUCOSEU NEGATIVE 07/06/2013 1855   HGBUR NEGATIVE 07/06/2013 1855   BILIRUBINUR NEGATIVE 07/06/2013 1855   KETONESUR NEGATIVE 07/06/2013 1855   PROTEINUR NEGATIVE 07/06/2013 1855   UROBILINOGEN 0.2 07/06/2013 1855   NITRITE NEGATIVE  07/06/2013 1855   LEUKOCYTESUR NEGATIVE 07/06/2013 1855    RADIOGRAPHIC STUDIES: Ct Soft Tissue Neck W Contrast  11/14/2013   CLINICAL DATA:  Knot on the right side of the neck. History of follicular lymphoma status post chemotherapy.  EXAM: CT OF THE NECK WITH CONTRAST  CT OF THE CHEST WITH CONTRAST  CT OF THE ABDOMEN AND PELVIS WITH CONTRAST  TECHNIQUE: Multidetector CT imaging of the neck was performed with intravenous contrast.; Multidetector CT imaging of the abdomen and pelvis was performed following the standard protocol during bolus administration of intravenous  contrast.; Multidetector CT imaging of the chest was performed following the standard protocol during bolus administration of intravenous contrast.  CONTRAST:  15mL OMNIPAQUE IOHEXOL 300 MG/ML  SOLN  COMPARISON:  CT of the chest, abdomen and pelvis 07/06/2013. CT cervical spine 08/06/2013. CT neck 06/06/2010.  FINDINGS: CT NECK FINDINGS  Multiple enlarged right-sided lymph nodes. Specific examples include a right level IIb lymph node measuring 2.1 x 1.7 cm (image 41 of series 2), a right level IIa lymph node measuring 2.6 x 1.8 cm (image 45 of series 2), another right level IIa lymph node measuring 1.9 x 1.6 cm (image 58 of series 2), a right level III lymph node measuring 1.8 x 1.6 cm (image 82 of series 2), and a right level IV lymph node measuring 1.6 x 1.2 cm (image 87 of series 2). No left-sided cervical lymphadenopathy is identified. Evaluation of the lower cervical nodal stations and supraclavicular region on the right side is significantly limited by a large amount of beam hardening artifact from the patient's right-sided shoulder arthroplasty. The cervical spine is again remarkable for severe multilevel degenerative disc disease, most evident at C5-C6 and C6-C7, which results in a focal kyphotic deformity of the cervical spine at this level. There is also mild multilevel facet arthropathy. These findings are very similar to prior CT of the cervical spine 08/06/2013.  CT CHEST FINDINGS  Mediastinum: Heart size is moderately enlarged with severe left atrial dilatation. There is no significant pericardial fluid, thickening or pericardial calcification. There is atherosclerosis of the thoracic aorta, the great vessels of the mediastinum and the coronary arteries, including calcified atherosclerotic plaque in the left anterior descending and left circumflex coronary arteries. No pathologically enlarged mediastinal or hilar lymph nodes. Esophagus is unremarkable in appearance.  Lungs/Pleura: No suspicious  appearing pulmonary nodules or masses. No acute consolidative airspace disease. No pleural effusions. A few scattered tiny clustered areas of peribronchovascular micronodularity are noted, most evident in the lateral segment of the right middle lobe, most compatible with areas of chronic mucoid impaction within terminal bronchioles.  Musculoskeletal: Status post right shoulder arthroplasty. There are no aggressive appearing lytic or blastic lesions noted in the visualized portions of the skeleton.  CT ABDOMEN AND PELVIS FINDINGS  Abdomen/Pelvis: The appearance of the liver, gallbladder, pancreas, spleen, bilateral adrenal glands and the right kidney is unremarkable. Well-defined low-attenuation lesions in the left kidney are noted, measuring up to 2.4 cm in diameter, compatible with simple cysts. No significant volume of ascites. No pneumoperitoneum. No pathologic distention of small bowel. No lymphadenopathy identified in the abdomen or pelvis. Large left inguinal hernia containing a portion of the proximal sigmoid colon, without evidence of incarceration or obstruction at this time. Extensive atherosclerosis throughout the abdominal and pelvic vasculature, without evidence of aneurysm or dissection. Urinary bladder is unremarkable in appearance.  Musculoskeletal: There are no aggressive appearing lytic or blastic lesions noted in the visualized portions of the skeleton.  IMPRESSION: 1. Bulky right-sided cervical lymphadenopathy, as detailed above, concerning for recurrence of disease. 2. However, compared to prior CT of the chest, abdomen and pelvis 07/06/2013, the previously noted mediastinal and hilar lymphadenopathy has resolved, as have the previously noted bilateral pleural effusions. 3. Cardiomegaly with severe left atrial dilatation redemonstrated. 4. Large left inguinal hernia containing a short segment of the sigmoid colon. This has significantly increased in size compared to the prior study. No  associated bowel incarceration or obstruction at this time. 5. Extensive atherosclerosis including 2 vessel coronary artery disease. 6. Additional incidental findings, as above.   Electronically Signed   By: Vinnie Langton M.D.   On: 11/14/2013 12:30     ASSESSMENT:  #1. 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  #2. Right inguinal hernia in need of repair.  #3. Right lower jaw cracked tooth, requiring extraction  #4. Systolic congestive heart failure, on therapy, compensated.  #5. Chronic atrial fibrillation.  #6. Chronic obstructive pulmonary disease    PLAN:  #1. Discontinue warfarin today. #2. Early morning prothrombin time on Doxil/21/2015 intake result 2 surgeon and dentist. #3. Tooth extraction at 1 PM on 12/12/2013. #4. Inguinal hernia repair and core biopsy of right neck lymph node on 12/16/2013. #5. Begin Xarelto 50 mg twice a day on 12/17/2013. #6. Followup here in 3 weeks with CBC and d-dimer.   All questions were answered. The patient knows to call the clinic with any problems, questions or concerns. We can certainly see the patient much sooner if necessary.   I spent 30 minutes counseling the patient face to face. The total time spent in the appointment was 40 minutes.    Doroteo Bradford, MD 12/08/2013 2:55 PM  DISCLAIMER:  This note was dictated with voice recognition software.  Similar sounding words can inadvertently be transcribed inaccurately and may not be corrected upon review.

## 2013-12-08 NOTE — Patient Instructions (Signed)
[facility] Discharge Instructions  RECOMMENDATIONS MADE BY THE CONSULTANT AND ANY TEST RESULTS WILL BE SENT TO YOUR REFERRING PHYSICIAN.  EXAM FINDINGS BY THE PHYSICIAN TODAY AND SIGNS OR SYMPTOMS TO REPORT TO CLINIC OR PRIMARY PHYSICIAN: Exam and findings as discussed by Dr Barnet Glasgow.  Go to heart center Monday a.m to have PT drawn.  Take the results to the surgeon at 11:00, then to Dentist at 1:00.  Hernia surgery 9/25, Dr to do neck lymph node biopsy.  Start Xarelto the day after your surgery.  Return here in 3 weeks for lab work and office visit.    Thank you for choosing [facility] to provide your oncology and hematology care.  To afford each patient quality time with our providers, please arrive at least 15 minutes before your scheduled appointment time.  With your help, our goal is to use those 15 minutes to complete the necessary work-up to ensure our physicians have the information they need to help with your evaluation and healthcare recommendations.    Effective January 1st, 2014, we ask that you re-schedule your appointment with our physicians should you arrive 10 or more minutes late for your appointment.  We strive to give you quality time with our providers, and arriving late affects you and other patients whose appointments are after yours.    Again, thank you for choosing [facility].  Our hope is that these requests will decrease the amount of time that you wait before being seen by our physicians.       _____________________________________________________________  Should you have questions after your visit to [facility], please contact our office at [phone] between the hours of 8:30 a.m. and 4:30 p.m.  Voicemails left after 4:30 p.m. will not be returned until the following business day.  For prescription refill requests, have your pharmacy contact our office with your prescription refill request.     _______________________________________________________________  We hope that we have given you very good care.  You may receive a patient satisfaction survey in the mail, please complete it and return it as soon as possible.  We value your feedback!  _______________________________________________________________  Have you asked about our STAR program?  STAR stands for Survivorship Training and Rehabilitation, and this is a nationally recognized cancer care program that focuses on survivorship and rehabilitation.  Cancer and cancer treatments may cause problems, such as, pain, making you feel tired and keeping you from doing the things that you need or want to do. Cancer rehabilitation can help. Our goal is to reduce these troubling effects and help you have the best quality of life possible.  You may receive a survey from a nurse that asks questions about your current state of health.  Based on the survey results, all eligible patients will be referred to the [facility] program for an evaluation so we can better serve you!  A frequently asked questions sheet is available upon request.

## 2013-12-09 NOTE — Patient Instructions (Signed)
Charles [name]  12/09/2013   Your procedure is scheduled on:   12/16/2013  Report to [facility] at  63  AM.  Call this number if you have problems the morning of surgery: [phone]   Remember:   Do not eat food or drink liquids after midnight.   Take these medicines the morning of surgery with A SIP OF WATER: xanax, digoxin, diltiazem, hydrocodone, prilosec, zoloft, ultram. Take your spiriva and proair before you come.   Do not wear jewelry, make-up or nail polish.  Do not wear lotions, powders, or perfumes.   Do not shave 48 hours prior to surgery. Men may shave face and neck.  Do not bring valuables to the hospital.  [facility] is not responsible for any belongings or valuables.               Contacts, dentures or bridgework may not be worn into surgery.  Leave suitcase in the car. After surgery it may be brought to your room.  For patients admitted to the hospital, discharge time is determined by your treatment team.               Patients discharged the day of surgery will not be allowed to drive home.  Name and phone number of your driver: family  Special Instructions: Shower using CHG 2 nights before surgery and the night before surgery.  If you shower the day of surgery use CHG.  Use special wash - you have one bottle of CHG for all showers.  You should use approximately 1/3 of the bottle for each shower.   Please read over the following fact sheets that you were given: Pain Booklet, Coughing and Deep Breathing, Surgical Site Infection Prevention, Anesthesia Post-op Instructions and Care and Recovery After Surgery Hernia A hernia occurs when an internal organ pushes out through a weak spot in the abdominal wall. Hernias most commonly occur in the groin and around the navel. Hernias often can be pushed back into place (reduced). Most hernias tend to get worse over time. Some abdominal hernias can get stuck in the opening (irreducible or incarcerated hernia) and cannot  be reduced. An irreducible abdominal hernia which is tightly squeezed into the opening is at risk for impaired blood supply (strangulated hernia). A strangulated hernia is a medical emergency. Because of the risk for an irreducible or strangulated hernia, surgery may be recommended to repair a hernia. CAUSES   Heavy lifting.  Prolonged coughing.  Straining to have a bowel movement.  A cut (incision) made during an abdominal surgery. HOME CARE INSTRUCTIONS   Bed rest is not required. You may continue your normal activities.  Avoid lifting more than 10 pounds (4.5 kg) or straining.  Cough gently. If you are a smoker it is best to stop. Even the best hernia repair can break down with the continual strain of coughing. Even if you do not have your hernia repaired, a cough will continue to aggravate the problem.  Do not wear anything tight over your hernia. Do not try to keep it in with an outside bandage or truss. These can damage abdominal contents if they are trapped within the hernia sac.  Eat a normal diet.  Avoid constipation. Straining over long periods of time will increase hernia size and encourage breakdown of repairs. If you cannot do this with diet alone, stool softeners may be used. SEEK IMMEDIATE MEDICAL CARE IF:   You have a fever.  You develop  increasing abdominal pain.  You feel nauseous or vomit.  Your hernia is stuck outside the abdomen, looks discolored, feels hard, or is tender.  You have any changes in your bowel habits or in the hernia that are unusual for you.  You have increased pain or swelling around the hernia.  You cannot push the hernia back in place by applying gentle pressure while lying down. MAKE SURE YOU:   Understand these instructions.  Will watch your condition.  Will get help right away if you are not doing well or get worse. Document Released: 03/10/2005 Document Revised: 06/02/2011 Document Reviewed: 10/28/2007 [facility] Patient  Information 2015 [place], [place]. This information is not intended to replace advice given to you by your health care provider. Make sure you discuss any questions you have with your health care provider. PATIENT INSTRUCTIONS POST-ANESTHESIA  IMMEDIATELY FOLLOWING SURGERY:  Do not drive or operate machinery for the first twenty four hours after surgery.  Do not make any important decisions for twenty four hours after surgery or while taking narcotic pain medications or sedatives.  If you develop intractable nausea and vomiting or a severe headache please notify your doctor immediately.  FOLLOW-UP:  Please make an appointment with your surgeon as instructed. You do not need to follow up with anesthesia unless specifically instructed to do so.  WOUND CARE INSTRUCTIONS (if applicable):  Keep a dry clean dressing on the anesthesia/puncture wound site if there is drainage.  Once the wound has quit draining you may leave it open to air.  Generally you should leave the bandage intact for twenty four hours unless there is drainage.  If the epidural site drains for more than 36-48 hours please call the anesthesia department.  QUESTIONS?:  Please feel free to call your physician or the hospital operator if you have any questions, and they will be happy to assist you.

## 2013-12-12 ENCOUNTER — Encounter (HOSPITAL_BASED_OUTPATIENT_CLINIC_OR_DEPARTMENT_OTHER)

## 2013-12-12 ENCOUNTER — Encounter (HOSPITAL_COMMUNITY)
Admission: RE | Admit: 2013-12-12 | Discharge: 2013-12-12 | Disposition: A | Discharge status: Home / Self Care | Payer: Medicare Other | Source: Ambulatory Visit | Attending: General Surgery | Admitting: General Surgery

## 2013-12-12 ENCOUNTER — Ambulatory Visit (INDEPENDENT_AMBULATORY_CARE_PROVIDER_SITE_OTHER): Payer: Medicare Other | Admitting: *Deleted

## 2013-12-12 ENCOUNTER — Other Ambulatory Visit (HOSPITAL_COMMUNITY): Payer: Self-pay | Admitting: Hematology and Oncology

## 2013-12-12 DIAGNOSIS — Z7901 Long term (current) use of anticoagulants: Secondary | ICD-10-CM

## 2013-12-12 DIAGNOSIS — I4891 Unspecified atrial fibrillation: Secondary | ICD-10-CM

## 2013-12-12 DIAGNOSIS — Z5181 Encounter for therapeutic drug level monitoring: Secondary | ICD-10-CM

## 2013-12-12 LAB — POCT INR: INR: 1.8

## 2013-12-12 MED ORDER — VITAMIN K1 10 MG/ML IJ SOLN
5.0000 mg | Freq: Once | INTRAMUSCULAR | Status: DC
  Filled 2013-12-12: qty 0.5

## 2013-12-12 MED ORDER — VITAMIN K1 10 MG/ML IJ SOLN
5.0000 mg | Freq: Once | INTRAMUSCULAR | Status: AC
  Administered 2013-12-12: 5 mg via SUBCUTANEOUS
  Filled 2013-12-12: qty 0.5

## 2013-12-12 NOTE — Progress Notes (Signed)
Vitamin K 5 mg subq left upper arm. Patient is to have dental extraction today and surgery on Friday this week. Tolerated well.

## 2013-12-12 NOTE — Patient Instructions (Signed)
[facility] Discharge Instructions  RECOMMENDATIONS MADE BY THE CONSULTANT AND ANY TEST RESULTS WILL BE SENT TO YOUR REFERRING PHYSICIAN.  MEDICATIONS PRESCRIBED:  Vitamin K 5 mg subq injection today.  INSTRUCTIONS/FOLLOW-UP: Return to clinic as scheduled.  Thank you for choosing [facility] to provide your oncology and hematology care.  To afford each patient quality time with our providers, please arrive at least 15 minutes before your scheduled appointment time.  With your help, our goal is to use those 15 minutes to complete the necessary work-up to ensure our physicians have the information they need to help with your evaluation and healthcare recommendations.    Effective January 1st, 2014, we ask that you re-schedule your appointment with our physicians should you arrive 10 or more minutes late for your appointment.  We strive to give you quality time with our providers, and arriving late affects you and other patients whose appointments are after yours.    Again, thank you for choosing [facility].  Our hope is that these requests will decrease the amount of time that you wait before being seen by our physicians.       _____________________________________________________________  Should you have questions after your visit to [facility], please contact our office at [phone] between the hours of 8:30 a.m. and 4:30 p.m.  Voicemails left after 4:30 p.m. will not be returned until the following business day.  For prescription refill requests, have your pharmacy contact our office with your prescription refill request.    _______________________________________________________________  We hope that we have given you very good care.  You may receive a patient satisfaction survey in the mail, please complete it and return it as soon as possible.  We value your  feedback!  _______________________________________________________________  Have you asked about our STAR program?  STAR stands for Survivorship Training and Rehabilitation, and this is a nationally recognized cancer care program that focuses on survivorship and rehabilitation.  Cancer and cancer treatments may cause problems, such as, pain, making you feel tired and keeping you from doing the things that you need or want to do. Cancer rehabilitation can help. Our goal is to reduce these troubling effects and help you have the best quality of life possible.  You may receive a survey from a nurse that asks questions about your current state of health.  Based on the survey results, all eligible patients will be referred to the [employer] program for an evaluation so we can better serve you!  A frequently asked questions sheet is available upon request.

## 2013-12-13 ENCOUNTER — Encounter (HOSPITAL_COMMUNITY): Payer: Self-pay

## 2013-12-13 ENCOUNTER — Encounter (HOSPITAL_COMMUNITY)
Admission: RE | Admit: 2013-12-13 | Discharge: 2013-12-13 | Disposition: A | Discharge status: Home / Self Care | Payer: Medicare Other | Source: Ambulatory Visit | Attending: General Surgery | Admitting: General Surgery

## 2013-12-13 DIAGNOSIS — Z87891 Personal history of nicotine dependence: Secondary | ICD-10-CM | POA: Diagnosis not present

## 2013-12-13 DIAGNOSIS — I10 Essential (primary) hypertension: Secondary | ICD-10-CM | POA: Diagnosis not present

## 2013-12-13 DIAGNOSIS — F411 Generalized anxiety disorder: Secondary | ICD-10-CM | POA: Diagnosis not present

## 2013-12-13 DIAGNOSIS — K4090 Unilateral inguinal hernia, without obstruction or gangrene, not specified as recurrent: Secondary | ICD-10-CM | POA: Diagnosis present

## 2013-12-13 DIAGNOSIS — Z87898 Personal history of other specified conditions: Secondary | ICD-10-CM | POA: Diagnosis not present

## 2013-12-13 DIAGNOSIS — Z9221 Personal history of antineoplastic chemotherapy: Secondary | ICD-10-CM | POA: Diagnosis not present

## 2013-12-13 DIAGNOSIS — J449 Chronic obstructive pulmonary disease, unspecified: Secondary | ICD-10-CM | POA: Diagnosis not present

## 2013-12-13 DIAGNOSIS — M129 Arthropathy, unspecified: Secondary | ICD-10-CM | POA: Diagnosis not present

## 2013-12-13 DIAGNOSIS — I509 Heart failure, unspecified: Secondary | ICD-10-CM | POA: Diagnosis not present

## 2013-12-13 DIAGNOSIS — K279 Peptic ulcer, site unspecified, unspecified as acute or chronic, without hemorrhage or perforation: Secondary | ICD-10-CM | POA: Diagnosis not present

## 2013-12-13 DIAGNOSIS — R599 Enlarged lymph nodes, unspecified: Secondary | ICD-10-CM | POA: Diagnosis not present

## 2013-12-13 DIAGNOSIS — I4891 Unspecified atrial fibrillation: Secondary | ICD-10-CM | POA: Diagnosis not present

## 2013-12-13 HISTORY — DX: Cardiac arrhythmia, unspecified: I49.9

## 2013-12-13 HISTORY — DX: Heart failure, unspecified: I50.9

## 2013-12-13 HISTORY — DX: Shortness of breath: R06.02

## 2013-12-13 LAB — BASIC METABOLIC PANEL
Anion gap: 11 (ref 5–15)
BUN: 19 mg/dL (ref 6–23)
CHLORIDE: 99 meq/L (ref 96–112)
CO2: 32 mEq/L (ref 19–32)
Calcium: 9.6 mg/dL (ref 8.4–10.5)
Creatinine, Ser: 1.38 mg/dL — ABNORMAL HIGH (ref 0.50–1.35)
GFR calc Af Amer: 57 mL/min — ABNORMAL LOW (ref >90)
GFR calc non Af Amer: 49 mL/min — ABNORMAL LOW (ref >90)
GLUCOSE: 139 mg/dL — AB (ref 70–99)
POTASSIUM: 4.2 meq/L (ref 3.7–5.3)
Sodium: 142 mEq/L (ref 137–147)

## 2013-12-13 LAB — CBC WITH DIFFERENTIAL/PLATELET
Basophils Absolute: 0.1 10*3/uL (ref 0.0–0.1)
Basophils Relative: 1 % (ref 0–1)
EOS PCT: 5 % (ref 0–5)
Eosinophils Absolute: 0.2 10*3/uL (ref 0.0–0.7)
HEMATOCRIT: 37.2 % — AB (ref 39.0–52.0)
Hemoglobin: 12.3 g/dL — ABNORMAL LOW (ref 13.0–17.0)
Lymphocytes Relative: 13 % (ref 12–46)
Lymphs Abs: 0.6 10*3/uL — ABNORMAL LOW (ref 0.7–4.0)
MCH: 31.3 pg (ref 26.0–34.0)
MCHC: 33.1 g/dL (ref 30.0–36.0)
MCV: 94.7 fL (ref 78.0–100.0)
MONO ABS: 0.4 10*3/uL (ref 0.1–1.0)
Monocytes Relative: 8 % (ref 3–12)
Neutro Abs: 3.6 10*3/uL (ref 1.7–7.7)
Neutrophils Relative %: 73 % (ref 43–77)
Platelets: 172 10*3/uL (ref 150–400)
RBC: 3.93 MIL/uL — ABNORMAL LOW (ref 4.22–5.81)
RDW: 15.2 % (ref 11.5–15.5)
WBC: 4.9 10*3/uL (ref 4.0–10.5)

## 2013-12-13 MED ORDER — CEFAZOLIN SODIUM-DEXTROSE 2-3 GM-% IV SOLR: 2.0000 g | Freq: Once | INTRAVENOUS | Status: DC

## 2013-12-13 NOTE — Consult Note (Signed)
NAME:  Charles [name], MARTINEZLOPEZ NO.:  [id number]  MEDICAL RECORD NO.:  [id number]  LOCATION:                                 FACILITY:  PHYSICIAN:  Felicie Morn, M.D. DATE OF BIRTH:  06/05/1940  DATE OF CONSULTATION:  12/12/2013 DATE OF DISCHARGE:                                CONSULTATION   NOTE:  Surgery was asked to see this 73 year old white male for repair of left inguinal hernia.  This was a rather large left inguinal hernia which he has had at least for the last 4 months and has become more symptomatic.  He also was noted to have recurrence of some adenopathy in his right cervical area and we planned to do a fine-needle aspiration of this as discussed preoperatively with the Oncology Department.  This is because this patient has had previous history of non-Hodgkin lymphoma and we wanted to check the cytology on this adenopathy.  PAST MEDICAL HISTORY:  Significant for history of non-Hodgkin lymphoma, this was diagnosed back in 2012.  He received a chemotherapy for this in the past and this was after a biopsy was done on his left cervical area. He has recurrent lymphadenopathy in and around the area of the previous biopsy site.  Other medical problems include a history of atrial fibrillation and hypertension.  He also has occasionally GERD.  PAST SURGERIES:  As stated a biopsy of his right cervical lymph node, and he has also had a Port-A-Cath placed for treatment of chemotherapy, he also had this removed later on.  He has had several orthopedic procedures including procedures on his right shoulder, and he had his left kneecap removed, and he had left rotator cuff treated.  He also had a basal cell skin cancer removed as well as a history of a melanoma on his left anterior chest which was excised in 2012.  ALLERGIES:  He has no known allergies.  SOCIAL HISTORY:  He stopped smoking in 2014.  He has no history of alcohol abuse.  PHYSICAL EXAMINATION:   GENERAL:  He is 5 feet 7 inches, weighs 183 pounds, his temperature is 97.9, his pulse is 90 and irregular, respirations are 16, blood pressure 102/80. HEAD AND NECK:  His pupils are equal and reactive to light and accommodation.  His extraocular movements are intact.  He has no jugular vein distention.  No thyromegaly is appreciated.  He has right cervical adenopathy as mentioned above. MOUTH:  His oral mucosa is moist.  He has dental prosthesis both superiorly and inferiorly.  He is also going to have a teeth extraction preoperatively, and we are going to take advantage of his time being off Coumadin to take care of this as well. CHEST:  He is status post excision of melanoma of the anterior chest. No palpable lesions are appreciated in this area.  No pigmented suspicious lesions.  No axillary adenopathy is appreciated. HEART:  Irregular.  He is in atrial fib. LUNGS:  Fairly clear. ABDOMEN:  Soft.  He has a large left inguinal hernia. RECTAL:  Deferred. EXTREMITIES:  He has history of fracture of his right wrist and as stated, he has had left knee cap removal and right  shoulder surgery, and he walks with a walker.  REVIEW OF SYSTEMS:  NEURO:  No history of migraines or seizures.  No obvious lateralizing neurological findings other than can be explained by his orthopedic problems.  ENDOCRINE:  No history of diabetes, thyroid disease, or adrenal problems.  CARDIOPULMONARY:  History of atrial fibrillation, he is on Coumadin for this.  He has a history of hypertension.  MUSCULOSKELETAL:  As stated, the above-mentioned orthopedic procedures on his left knee cap, right shoulder, and right wrist.  GI:  History of GERD.  His last colonoscopy was 2009.  He has no past history of hepatitis.  He suffers from constipation.  He has no history of diarrhea, bright red rectal bleeding except associated with the hemorrhoids.  No past history of melena or inflammatory bowel disease or irritable  bowel syndrome.  GU:  Past history of kidney stones.  No recent history of frequency or dysuria.  REVIEW OF HISTORY AND PHYSICAL:  Therefore, Charles [name] is a 73 year old white male who has been cleared for surgery for repair of symptomatic rather moderate to large size left inguinal hernia.  We discussed surgical treatment discussing complications not limited to but including bleeding, infection, recurrence, and the possibility that a mesh prosthesis might be required.  Informed consent was obtained.  We also discussed the need for a fine needle aspiration.  I discussed this with Dr. Barnet Glasgow preoperatively and we will hopefully be able to provide this during the same sitting when he is in the operating room.  We will plan for surgery via the outpatient department.  I have refused as some of his labs and his recent INR is 1.8.  Dr. Barnet Glasgow will see him today and will plan for giving him some vitamin K, I believe.  As for his other medications, see medication list.  We will also probably have Dr. Johnny Bridge take a look at him postoperatively as well.     Felicie Morn, M.D.     WB/MEDQ  D:  12/12/2013  T:  12/13/2013  Job:  [id number]  cc:   Satira Sark, MD  Estill Bamberg. Karie Kirks, M.D. Fax: [phone]  Doroteo Jisselle Poth MD

## 2013-12-15 NOTE — OR Nursing (Signed)
No ultrasound needed for the fine needle aspiration  of right neck mass per Dr. Romona Curls.

## 2013-12-16 ENCOUNTER — Encounter (HOSPITAL_COMMUNITY): Payer: Self-pay | Admitting: *Deleted

## 2013-12-16 ENCOUNTER — Other Ambulatory Visit: Payer: Self-pay

## 2013-12-16 ENCOUNTER — Ambulatory Visit (HOSPITAL_COMMUNITY)
Admission: RE | Admit: 2013-12-16 | Discharge: 2013-12-17 | Disposition: A | Discharge status: Home / Self Care | Payer: Medicare Other | Source: Ambulatory Visit | Attending: General Surgery | Admitting: General Surgery

## 2013-12-16 ENCOUNTER — Ambulatory Visit (HOSPITAL_COMMUNITY): Payer: Medicare Other | Admitting: Anesthesiology

## 2013-12-16 ENCOUNTER — Encounter (HOSPITAL_COMMUNITY)
Admission: RE | Disposition: A | Discharge status: Home / Self Care | Payer: Self-pay | Source: Ambulatory Visit | Attending: General Surgery

## 2013-12-16 ENCOUNTER — Encounter (HOSPITAL_COMMUNITY): Payer: Medicare Other | Admitting: Anesthesiology

## 2013-12-16 ENCOUNTER — Telehealth: Payer: Self-pay

## 2013-12-16 DIAGNOSIS — Z9889 Other specified postprocedural states: Secondary | ICD-10-CM

## 2013-12-16 DIAGNOSIS — J449 Chronic obstructive pulmonary disease, unspecified: Secondary | ICD-10-CM | POA: Diagnosis not present

## 2013-12-16 DIAGNOSIS — M129 Arthropathy, unspecified: Secondary | ICD-10-CM | POA: Insufficient documentation

## 2013-12-16 DIAGNOSIS — R599 Enlarged lymph nodes, unspecified: Secondary | ICD-10-CM | POA: Insufficient documentation

## 2013-12-16 DIAGNOSIS — F411 Generalized anxiety disorder: Secondary | ICD-10-CM | POA: Insufficient documentation

## 2013-12-16 DIAGNOSIS — I059 Rheumatic mitral valve disease, unspecified: Secondary | ICD-10-CM

## 2013-12-16 DIAGNOSIS — C8290 Follicular lymphoma, unspecified, unspecified site: Secondary | ICD-10-CM

## 2013-12-16 DIAGNOSIS — Z5181 Encounter for therapeutic drug level monitoring: Secondary | ICD-10-CM

## 2013-12-16 DIAGNOSIS — I509 Heart failure, unspecified: Secondary | ICD-10-CM | POA: Insufficient documentation

## 2013-12-16 DIAGNOSIS — Z87891 Personal history of nicotine dependence: Secondary | ICD-10-CM | POA: Insufficient documentation

## 2013-12-16 DIAGNOSIS — Z87898 Personal history of other specified conditions: Secondary | ICD-10-CM | POA: Insufficient documentation

## 2013-12-16 DIAGNOSIS — F17200 Nicotine dependence, unspecified, uncomplicated: Secondary | ICD-10-CM

## 2013-12-16 DIAGNOSIS — I10 Essential (primary) hypertension: Secondary | ICD-10-CM | POA: Diagnosis not present

## 2013-12-16 DIAGNOSIS — Z01810 Encounter for preprocedural cardiovascular examination: Secondary | ICD-10-CM

## 2013-12-16 DIAGNOSIS — I340 Nonrheumatic mitral (valve) insufficiency: Secondary | ICD-10-CM

## 2013-12-16 DIAGNOSIS — Z9221 Personal history of antineoplastic chemotherapy: Secondary | ICD-10-CM | POA: Insufficient documentation

## 2013-12-16 DIAGNOSIS — E785 Hyperlipidemia, unspecified: Secondary | ICD-10-CM

## 2013-12-16 DIAGNOSIS — I5042 Chronic combined systolic (congestive) and diastolic (congestive) heart failure: Secondary | ICD-10-CM

## 2013-12-16 DIAGNOSIS — M170 Bilateral primary osteoarthritis of knee: Secondary | ICD-10-CM

## 2013-12-16 DIAGNOSIS — K279 Peptic ulcer, site unspecified, unspecified as acute or chronic, without hemorrhage or perforation: Secondary | ICD-10-CM | POA: Insufficient documentation

## 2013-12-16 DIAGNOSIS — K4090 Unilateral inguinal hernia, without obstruction or gangrene, not specified as recurrent: Secondary | ICD-10-CM | POA: Diagnosis not present

## 2013-12-16 DIAGNOSIS — Z7901 Long term (current) use of anticoagulants: Secondary | ICD-10-CM

## 2013-12-16 DIAGNOSIS — J4489 Other specified chronic obstructive pulmonary disease: Secondary | ICD-10-CM | POA: Insufficient documentation

## 2013-12-16 DIAGNOSIS — I4891 Unspecified atrial fibrillation: Secondary | ICD-10-CM | POA: Insufficient documentation

## 2013-12-16 DIAGNOSIS — I428 Other cardiomyopathies: Secondary | ICD-10-CM

## 2013-12-16 DIAGNOSIS — I482 Chronic atrial fibrillation, unspecified: Secondary | ICD-10-CM

## 2013-12-16 HISTORY — PX: MASS BIOPSY: SHX5445

## 2013-12-16 HISTORY — PX: INGUINAL HERNIA REPAIR: SHX194

## 2013-12-16 LAB — PROTIME-INR
INR: 1.12 (ref 0.00–1.49)
Prothrombin Time: 14.4 seconds (ref 11.6–15.2)

## 2013-12-16 SURGERY — REPAIR, HERNIA, INGUINAL, ADULT
Anesthesia: Spinal | Site: Neck | Laterality: Right

## 2013-12-16 MED ORDER — POTASSIUM CHLORIDE IN NACL 20-0.9 MEQ/L-% IV SOLN
INTRAVENOUS | Status: DC
  Administered 2013-12-16: 16:00:00 via INTRAVENOUS

## 2013-12-16 MED ORDER — EPHEDRINE SULFATE 50 MG/ML IJ SOLN
INTRAMUSCULAR | Status: DC | PRN
  Administered 2013-12-16 (×2): 10 mg via INTRAVENOUS
  Administered 2013-12-16: 5 mg via INTRAVENOUS
  Administered 2013-12-16: 10 mg via INTRAVENOUS
  Administered 2013-12-16: 5 mg via INTRAVENOUS
  Administered 2013-12-16: 10 mg via INTRAVENOUS

## 2013-12-16 MED ORDER — DIGOXIN 125 MCG PO TABS
0.1250 mg | ORAL_TABLET | Freq: Every morning | ORAL | Status: DC
  Administered 2013-12-17: 0.125 mg via ORAL
  Filled 2013-12-16: qty 1

## 2013-12-16 MED ORDER — EPHEDRINE SULFATE 50 MG/ML IJ SOLN
INTRAMUSCULAR | Status: AC
  Filled 2013-12-16: qty 1

## 2013-12-16 MED ORDER — LACTATED RINGERS IV SOLN
INTRAVENOUS | Status: DC
  Administered 2013-12-16 (×2): via INTRAVENOUS

## 2013-12-16 MED ORDER — FENTANYL CITRATE 0.05 MG/ML IJ SOLN: 25.0000 ug | INTRAMUSCULAR | Status: DC | PRN

## 2013-12-16 MED ORDER — PANTOPRAZOLE SODIUM 40 MG PO TBEC
40.0000 mg | DELAYED_RELEASE_TABLET | Freq: Every day | ORAL | Status: DC
  Administered 2013-12-17: 40 mg via ORAL
  Filled 2013-12-16: qty 1

## 2013-12-16 MED ORDER — LIDOCAINE HCL 1 % IJ SOLN
INTRAMUSCULAR | Status: DC | PRN
  Administered 2013-12-16: 2 mL

## 2013-12-16 MED ORDER — BUPIVACAINE HCL (PF) 0.5 % IJ SOLN
INTRAMUSCULAR | Status: AC
  Filled 2013-12-16: qty 30

## 2013-12-16 MED ORDER — SIMETHICONE 80 MG PO CHEW
80.0000 mg | CHEWABLE_TABLET | Freq: Four times a day (QID) | ORAL | Status: DC | PRN
  Administered 2013-12-16: 80 mg via ORAL
  Filled 2013-12-16: qty 1

## 2013-12-16 MED ORDER — FENTANYL CITRATE 0.05 MG/ML IJ SOLN
INTRAMUSCULAR | Status: AC
  Filled 2013-12-16: qty 2

## 2013-12-16 MED ORDER — FENTANYL CITRATE 0.05 MG/ML IJ SOLN
INTRAMUSCULAR | Status: DC | PRN
  Administered 2013-12-16: 25 ug via INTRATHECAL

## 2013-12-16 MED ORDER — MIDAZOLAM HCL 2 MG/2ML IJ SOLN
INTRAMUSCULAR | Status: AC
  Filled 2013-12-16: qty 2

## 2013-12-16 MED ORDER — SERTRALINE HCL 50 MG PO TABS
50.0000 mg | ORAL_TABLET | Freq: Every day | ORAL | Status: DC
  Administered 2013-12-16: 50 mg via ORAL
  Filled 2013-12-16: qty 1

## 2013-12-16 MED ORDER — POTASSIUM CHLORIDE CRYS ER 20 MEQ PO TBCR
20.0000 meq | EXTENDED_RELEASE_TABLET | Freq: Two times a day (BID) | ORAL | Status: DC
  Administered 2013-12-16 – 2013-12-17 (×3): 20 meq via ORAL
  Filled 2013-12-16 (×3): qty 1

## 2013-12-16 MED ORDER — MIDAZOLAM HCL 5 MG/5ML IJ SOLN
INTRAMUSCULAR | Status: DC | PRN
  Administered 2013-12-16: 1 mg via INTRAVENOUS

## 2013-12-16 MED ORDER — TIOTROPIUM BROMIDE MONOHYDRATE 18 MCG IN CAPS
18.0000 ug | ORAL_CAPSULE | Freq: Every day | RESPIRATORY_TRACT | Status: DC | PRN
  Filled 2013-12-16: qty 5

## 2013-12-16 MED ORDER — ONDANSETRON HCL 4 MG PO TABS: 4.0000 mg | ORAL_TABLET | Freq: Four times a day (QID) | ORAL | Status: DC | PRN

## 2013-12-16 MED ORDER — SODIUM CHLORIDE 0.9 % IJ SOLN
INTRAMUSCULAR | Status: AC
  Filled 2013-12-16: qty 10

## 2013-12-16 MED ORDER — SODIUM CHLORIDE 0.9 % IR SOLN
Status: DC | PRN
  Administered 2013-12-16: 1000 mL

## 2013-12-16 MED ORDER — RIVAROXABAN 20 MG PO TABS: 20.0000 mg | ORAL_TABLET | Freq: Every day | ORAL | Status: DC

## 2013-12-16 MED ORDER — PROPOFOL INFUSION 10 MG/ML OPTIME
INTRAVENOUS | Status: DC | PRN
  Administered 2013-12-16: 40 ug/kg/min via INTRAVENOUS
  Administered 2013-12-16: 13:00:00 via INTRAVENOUS

## 2013-12-16 MED ORDER — ALBUTEROL SULFATE (2.5 MG/3ML) 0.083% IN NEBU: 3.0000 mL | INHALATION_SOLUTION | Freq: Four times a day (QID) | RESPIRATORY_TRACT | Status: DC | PRN

## 2013-12-16 MED ORDER — ONDANSETRON HCL 4 MG/2ML IJ SOLN
4.0000 mg | Freq: Once | INTRAMUSCULAR | Status: AC
  Administered 2013-12-16: 4 mg via INTRAVENOUS

## 2013-12-16 MED ORDER — ONDANSETRON HCL 4 MG/2ML IJ SOLN: 4.0000 mg | Freq: Once | INTRAMUSCULAR | Status: DC | PRN

## 2013-12-16 MED ORDER — ONDANSETRON HCL 4 MG/2ML IJ SOLN
4.0000 mg | Freq: Four times a day (QID) | INTRAMUSCULAR | Status: DC | PRN
Start: 2013-12-16 — End: 2013-12-17

## 2013-12-16 MED ORDER — ALPRAZOLAM 0.5 MG PO TABS
0.5000 mg | ORAL_TABLET | Freq: Three times a day (TID) | ORAL | Status: DC
  Administered 2013-12-16 – 2013-12-17 (×3): 0.5 mg via ORAL
  Filled 2013-12-16 (×3): qty 1

## 2013-12-16 MED ORDER — BUPIVACAINE HCL (PF) 0.5 % IJ SOLN
INTRAMUSCULAR | Status: DC | PRN
Start: 2013-12-16 — End: 2013-12-16
  Administered 2013-12-16: 10 mL

## 2013-12-16 MED ORDER — CEFAZOLIN SODIUM 1-5 GM-% IV SOLN
INTRAVENOUS | Status: AC
  Filled 2013-12-16: qty 50

## 2013-12-16 MED ORDER — CEFAZOLIN SODIUM-DEXTROSE 2-3 GM-% IV SOLR
2.0000 g | Freq: Once | INTRAVENOUS | Status: AC
  Administered 2013-12-16: 2 g via INTRAVENOUS

## 2013-12-16 MED ORDER — ONDANSETRON HCL 4 MG/2ML IJ SOLN
INTRAMUSCULAR | Status: AC
  Filled 2013-12-16: qty 2

## 2013-12-16 MED ORDER — VITAMIN D 1000 UNITS PO TABS
1000.0000 [IU] | ORAL_TABLET | Freq: Every morning | ORAL | Status: DC
  Administered 2013-12-16 – 2013-12-17 (×2): 1000 [IU] via ORAL
  Filled 2013-12-16 (×2): qty 1

## 2013-12-16 MED ORDER — FENTANYL CITRATE 0.05 MG/ML IJ SOLN
25.0000 ug | INTRAMUSCULAR | Status: AC
  Administered 2013-12-16 (×2): 25 ug via INTRAVENOUS

## 2013-12-16 MED ORDER — PROPOFOL 10 MG/ML IV EMUL
INTRAVENOUS | Status: AC
  Filled 2013-12-16: qty 20

## 2013-12-16 MED ORDER — BUPIVACAINE IN DEXTROSE 0.75-8.25 % IT SOLN
INTRATHECAL | Status: DC | PRN
  Administered 2013-12-16: 15 mg via INTRATHECAL

## 2013-12-16 MED ORDER — LIDOCAINE HCL (PF) 1 % IJ SOLN
INTRAMUSCULAR | Status: AC
  Filled 2013-12-16: qty 5

## 2013-12-16 MED ORDER — POLYETHYLENE GLYCOL 3350 17 G PO PACK
17.0000 g | PACK | Freq: Every day | ORAL | Status: DC | PRN
Start: 2013-12-16 — End: 2013-12-17
  Administered 2013-12-17: 17 g via ORAL
  Filled 2013-12-16: qty 1

## 2013-12-16 MED ORDER — MIDAZOLAM HCL 2 MG/2ML IJ SOLN
1.0000 mg | INTRAMUSCULAR | Status: DC | PRN
  Administered 2013-12-16: 2 mg via INTRAVENOUS

## 2013-12-16 MED ORDER — DILTIAZEM HCL ER COATED BEADS 180 MG PO CP24
180.0000 mg | ORAL_CAPSULE | Freq: Every day | ORAL | Status: DC
  Administered 2013-12-17: 180 mg via ORAL
  Filled 2013-12-16: qty 1

## 2013-12-16 MED ORDER — SIMVASTATIN 10 MG PO TABS
10.0000 mg | ORAL_TABLET | Freq: Every day | ORAL | Status: DC
  Administered 2013-12-16: 10 mg via ORAL
  Filled 2013-12-16: qty 1

## 2013-12-16 MED ORDER — DOCUSATE SODIUM 100 MG PO CAPS
100.0000 mg | ORAL_CAPSULE | Freq: Every morning | ORAL | Status: DC
  Administered 2013-12-16 – 2013-12-17 (×2): 100 mg via ORAL
  Filled 2013-12-16 (×2): qty 1

## 2013-12-16 MED ORDER — FENTANYL CITRATE 0.05 MG/ML IJ SOLN
INTRAMUSCULAR | Status: DC | PRN
  Administered 2013-12-16: 25 ug via INTRAVENOUS

## 2013-12-16 MED ORDER — FUROSEMIDE 80 MG PO TABS
80.0000 mg | ORAL_TABLET | Freq: Two times a day (BID) | ORAL | Status: DC
  Administered 2013-12-16 – 2013-12-17 (×2): 80 mg via ORAL
  Filled 2013-12-16 (×2): qty 1

## 2013-12-16 MED ORDER — LIDOCAINE HCL (PF) 1 % IJ SOLN
INTRAMUSCULAR | Status: AC
  Filled 2013-12-16: qty 30

## 2013-12-16 MED ORDER — HYDROCODONE-ACETAMINOPHEN 10-325 MG PO TABS
1.0000 | ORAL_TABLET | Freq: Four times a day (QID) | ORAL | Status: DC | PRN
  Administered 2013-12-16 – 2013-12-17 (×3): 1 via ORAL
  Filled 2013-12-16 (×3): qty 1

## 2013-12-16 MED ORDER — BACITRACIN 50000 UNITS IM SOLR
INTRAMUSCULAR | Status: AC
  Filled 2013-12-16: qty 1

## 2013-12-16 MED ORDER — MORPHINE SULFATE 2 MG/ML IJ SOLN
1.0000 mg | INTRAMUSCULAR | Status: DC | PRN
  Administered 2013-12-16 – 2013-12-17 (×3): 1 mg via INTRAVENOUS
  Filled 2013-12-16 (×5): qty 1

## 2013-12-16 MED ORDER — BUPIVACAINE IN DEXTROSE 0.75-8.25 % IT SOLN
INTRATHECAL | Status: AC
  Filled 2013-12-16: qty 2

## 2013-12-16 SURGICAL SUPPLY — 56 items
ATTRACTOMAT 16X20 MAGNETIC DRP (DRAPES) ×4 IMPLANT
BAG HAMPER (MISCELLANEOUS) ×4 IMPLANT
BLADE 10 SAFETY STRL DISP (BLADE) IMPLANT
BLADE SURG 15 STRL LF DISP TIS (BLADE) ×2 IMPLANT
BLADE SURG 15 STRL SS (BLADE) ×2
BLADE SURG SZ10 CARB STEEL (BLADE) ×4 IMPLANT
CLOTH BEACON ORANGE TIMEOUT ST (SAFETY) ×4 IMPLANT
COVER LIGHT HANDLE STERIS (MISCELLANEOUS) ×8 IMPLANT
DECANTER SPIKE VIAL GLASS SM (MISCELLANEOUS) ×4 IMPLANT
DRAIN PENROSE 12X.25 LTX STRL (MISCELLANEOUS) ×4 IMPLANT
DRSG MEPILEX BORDER 4X8 (GAUZE/BANDAGES/DRESSINGS) IMPLANT
DRSG OPSITE POSTOP 3X4 (GAUZE/BANDAGES/DRESSINGS) ×8 IMPLANT
ELECT REM PT RETURN 9FT ADLT (ELECTROSURGICAL) ×4
ELECTRODE REM PT RTRN 9FT ADLT (ELECTROSURGICAL) ×2 IMPLANT
FORMALIN 10 PREFIL 120ML (MISCELLANEOUS) IMPLANT
GAUZE SPONGE 4X4 12PLY STRL (GAUZE/BANDAGES/DRESSINGS) IMPLANT
GLOVE BIO SURGEON STRL SZ 6.5 (GLOVE) ×3 IMPLANT
GLOVE BIO SURGEONS STRL SZ 6.5 (GLOVE) ×1
GLOVE BIOGEL PI IND STRL 7.0 (GLOVE) ×2 IMPLANT
GLOVE BIOGEL PI IND STRL 7.5 (GLOVE) ×2 IMPLANT
GLOVE BIOGEL PI INDICATOR 7.0 (GLOVE) ×2
GLOVE BIOGEL PI INDICATOR 7.5 (GLOVE) ×2
GLOVE ECLIPSE 6.5 STRL STRAW (GLOVE) ×4 IMPLANT
GLOVE EXAM NITRILE MD LF STRL (GLOVE) ×4 IMPLANT
GLOVE INDICATOR 7.0 STRL GRN (GLOVE) ×4 IMPLANT
GLOVE SKINSENSE NS SZ7.0 (GLOVE) ×2
GLOVE SKINSENSE STRL SZ7.0 (GLOVE) ×2 IMPLANT
GOWN STRL REUS W/TWL LRG LVL3 (GOWN DISPOSABLE) ×12 IMPLANT
INST SET MINOR GENERAL (KITS) ×4 IMPLANT
KIT ROOM TURNOVER APOR (KITS) ×4 IMPLANT
MANIFOLD NEPTUNE II (INSTRUMENTS) ×4 IMPLANT
NDL SAFETY ECLIPSE 18X1.5 (NEEDLE) ×2 IMPLANT
NEEDLE HYPO 18GX1.5 SHARP (NEEDLE) ×2
NS IRRIG 1000ML POUR BTL (IV SOLUTION) ×4 IMPLANT
PACK MINOR (CUSTOM PROCEDURE TRAY) ×4 IMPLANT
PAD ARMBOARD 7.5X6 YLW CONV (MISCELLANEOUS) ×4 IMPLANT
SET BASIN LINEN APH (SET/KITS/TRAYS/PACK) ×4 IMPLANT
SOL PREP PROV IODINE SCRUB 4OZ (MISCELLANEOUS) ×4 IMPLANT
SPONGE GAUZE 4X4 12PLY (GAUZE/BANDAGES/DRESSINGS) ×8 IMPLANT
SPONGE INTESTINAL PEANUT (DISPOSABLE) ×4 IMPLANT
SPONGE LAP 18X18 X RAY DECT (DISPOSABLE) ×4 IMPLANT
STAPLER VISISTAT 35W (STAPLE) ×4 IMPLANT
SUT NOVA NAB GS-22 2 2-0 T-19 (SUTURE) IMPLANT
SUT NUROLON NAB CT 2 2-0 18IN (SUTURE) ×4 IMPLANT
SUT PROLENE 0 CT 1 CR/8 (SUTURE) IMPLANT
SUT SILK 2 0 (SUTURE) ×2
SUT SILK 2-0 18XBRD TIE 12 (SUTURE) ×2 IMPLANT
SUT VIC AB 3-0 SH 27 (SUTURE) ×2
SUT VIC AB 3-0 SH 27X BRD (SUTURE) ×2 IMPLANT
SUT VICRYL AB 3 0 TIES (SUTURE) ×4 IMPLANT
SYR BULB IRRIGATION 50ML (SYRINGE) ×4 IMPLANT
SYR CONTROL 10ML LL (SYRINGE) ×8 IMPLANT
SYRINGE 10CC LL (SYRINGE) ×4 IMPLANT
TAPE PAPER 3X10 WHT MICROPORE (GAUZE/BANDAGES/DRESSINGS) ×4 IMPLANT
TRAY FOLEY CATH 16FR SILVER (SET/KITS/TRAYS/PACK) ×4 IMPLANT
WATER STERILE IRR 1000ML POUR (IV SOLUTION) ×8 IMPLANT

## 2013-12-16 NOTE — Progress Notes (Signed)
Pt ambulated in room approximately 50 feet with standby assist/standard walker. Tolerated well. Family at bedside. Pt left sitting on side of the bed, with call bell in reach.  Encouraged to call when needed.

## 2013-12-16 NOTE — Anesthesia Postprocedure Evaluation (Signed)
  Anesthesia Post-op Note  Patient: Charles [name]  Procedure(s) Performed: Procedure(s): HERNIA REPAIR INGUINAL ADULT (Left) FINE NEEDLE ASPIRATION NECK MASS (Right)  Patient Location: PACU  Anesthesia Type:Spinal  Level of Consciousness: awake, alert , oriented and patient cooperative  Airway and Oxygen Therapy: Patient Spontanous Breathing  Post-op Pain: 3 /10, mild  Post-op Assessment: Post-op Vital signs reviewed, Patient's Cardiovascular Status Stable, Respiratory Function Stable, Patent Airway and Pain level controlled  Post-op Vital Signs: Reviewed and stable  Last Vitals:  Filed Vitals:   12/16/13 1110  BP: 112/76  Pulse:   Temp:   Resp: 20    Complications: No apparent anesthesia complications

## 2013-12-16 NOTE — Consult Note (Signed)
CARDIOLOGY CONSULT NOTE   Patient ID: Charles [name] MRN: [id number] DOB/AGE: Mar 13, 1941 73 y.o.  Admit Date: 12/16/2013 Referring Physician: Felicie Morn MD Primary Physician: Charles Bellow, MD Consulting Cardiologist: Charles Sable MD Primary Cardiologist Charles Lesches MD Reason for Consultation:  Recommendations on anticoagulant therapy  Clinical Summary Charles [name] is a 73 y.o.male with known history of atrial fib on coumadin therapy, with plans to transition to Xarelto, NCM Ef of 35%-40% with grade 2 diastolic dysfunction , non-obstructive CAD, who was last seen by Charles [name] 11/24/2013 for pre-operative evaluation prior to planned hernia repair and biopsy of the right neck lymphoma. We are asked to given recommendation for anticoagulation resumption post-operatively.   He had successful completion of left inguinal hernia repair and biopsy and is now recovering. He is stable   Allergies  Allergen Reactions  . Metoprolol Shortness Of Breath    Intolerant to all BB  . Statins     Intolerant secondary to severe myalgias    Medications Scheduled Medications: . ALPRAZolam  0.5 mg Oral TID  . cholecalciferol  1,000 Units Oral q morning - 10a  . digoxin  0.125 mg Oral q morning - 10a  . diltiazem  180 mg Oral Daily  . docusate sodium  100 mg Oral q morning - 10a  . furosemide  80 mg Oral BID  . pantoprazole  40 mg Oral Daily  . potassium chloride SA  20 mEq Oral BID  . sertraline  50 mg Oral QHS  . simvastatin  10 mg Oral q1800    Infusions: . 0.9 % NaCl with KCl 20 mEq / L      PRN Medications: albuterol, HYDROcodone-acetaminophen, morphine injection, ondansetron (ZOFRAN) IV, ondansetron, polyethylene glycol, simethicone, tiotropium   Past Medical History  Diagnosis Date  . Essential hypertension, benign   . Atrial fibrillation 10/2008  . 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  Peptic ulcer disease   . Degenerative joint disease     Knees and shoulders  . Pneumonia 2010  . Allergic rhinitis   . Anxiety   . Drug abuse   . Tobacco abuse     50 pack years  . Mitral regurgitation     Moderate to severe May 2014  . COPD (chronic obstructive pulmonary disease)   . 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 Basal cell carcinoma   . Hernia, inguinal, left   . Arm fracture, right 07/2013  . Hernia, inguinal, left   . Shortness of breath   . Dysrhythmia   . CHF (congestive heart failure)     Past Surgical History  Procedure Laterality Date  . Neck lesion biopsy  June 23, 1025    Follicular lymphoma  . Skin cancer excision      under left breast; variously described as melanoma and basal cell  . Rotator cuff repair      right  . Patella fracture surgery Left 1975  . Hematoma evacuation      after melanoma removal  . Portacath placement  07/12/2010  . Colonoscopy  2009    Also underwent an EGD; patient reports no significant findings  . Bone marrow aspiration  06/2012  . Bone marrow biopsy  06/2012  . Fungal infection    . Port-a-cath removal Left 01/17/2013    Procedure: REMOVAL PORT-A-CATH;  Surgeon: Scherry Ran, MD;  Location: [facility];  Service: General;  Laterality: Left;    Family  History  Problem Relation Age of Onset  . Heart attack Father   . Coronary artery disease Other     Social History Charles [name] reports that he quit smoking about 20 months ago. His smoking use included Cigarettes. He has a 50 pack-year smoking history. He has never used smokeless tobacco. Charles [name] reports that he does not drink alcohol.  Review of Systems Otherwise reviewed and negative except as outlined.  Physical Examination Blood pressure 95/57, pulse 100, temperature 97.7 F (36.5 C), temperature source Oral, resp. rate 15, height 5\' 7"  (1.702 m), weight 190 lb (86.183 kg), SpO2 95.00%.  Intake/Output Summary (Last 24 hours) at 12/16/13 1452 Last data filed at  12/16/13 1310  Gross per 24 hour  Intake   1300 ml  Output      0 ml  Net   1300 ml    Telemetry:Atrial fib rate 78  12/18/13, comfortable, no acute distress HEENT: Conjunctiva and lids normal, oropharynx clear with moist mucosa. Neck: Supple, no elevated JVP or carotid bruits, no thyromegaly. Lungs: Clear to auscultation, nonlabored breathing at rest. Cardiac: Regular rate and rhythm, no S3 or significant systolic murmur, no pericardial rub. Abdomen: Soft, nontender, no hepatomegaly, bowel sounds present, no guarding or rebound. Extremities: No pitting edema, distal pulses 2+. Skin: Warm and dry. Musculoskeletal: No kyphosis. Neuropsychiatric: Alert and oriented x3, affect grossly appropriate.  Prior Cardiac Testing/Procedures 1.Echocardiogram 05/24/2013 Left ventricle: The cavity size was normal. Wall thickness was increased in a pattern of mild LVH. Systolic function was moderately reduced. The estimated ejection fraction was in the range of 35% to 40%. There is hypokinesis of the inferolateral myocardium. Features are consistent with a pseudonormal left ventricular filling pattern, with concomitant abnormal relaxation and increased filling pressure (grade 2 diastolic dysfunction). - Aortic valve: Mildly to moderately calcified annulus. Trileaflet. - Mitral valve: Calcified annulus. Mildly thickened leaflets . Moderate regurgitation directed posteriorly. Regurgitant volume: 65ml (PISA). - Left atrium: The atrium was severely dilated. - Right atrium: The atrium was mildly to moderately dilated. Central venous pressure: 52mm Hg (est). - Atrial septum: No defect or patent foramen ovale was identified. - Tricuspid valve: Trivial regurgitation. - Pulmonary arteries: Systolic pressure could not be accurately estimated. - Pericardium, extracardiac: There was no pericardial effusion.   Lab Results  Basic Metabolic Panel:  Recent Labs Lab 12/13/13 0935  NA 142  K 4.2    CL 99  CO2 32  GLUCOSE 139*  BUN 19  CREATININE 1.38*  CALCIUM 9.6    CBC:  Recent Labs Lab 12/13/13 0935  WBC 4.9  NEUTROABS 3.6  HGB 12.3*  HCT 37.2*  MCV 94.7  PLT 172     ECG: Ordered.   Impression and Recommendations  1.Atrial fib: Coumadin is on hold with plans to transition to Xarelto. Creatinine is 1.38 , Cr Cl 60.77 This am.INR 1.12 today. He does not have hx of CVA and therefore he is of less risk to wait a couple of days to start medication. His wife already has a bottle of Xarelto with her that was filled prior to surgery at 15 mg BID. This dose will be adjusted to recommended daily dosage of  20 mg daily. Recommend that he start this on Monday, Sept 28, 2015. Will call in Rx for appropriate dose. Continue diltiazem and digoxin for HR control.   2. S/P Left inguinal hernia repair with biopsy of the right neck lymphoma: Recovering well, without bleeding or hematoma.   3.NICM EF of 35%-40%:  No evidence of decompensation. Continue lasix 80 mg BID as directed.  4. Hypercholesterolemia: Continue statin therapy.    Signed: Phill Myron. Weslie Pretlow NP  12/16/2013, 2:52 PM Co-Sign MD

## 2013-12-16 NOTE — Transfer of Care (Signed)
Immediate Anesthesia Transfer of Care Note  Patient: Charles [name]  Procedure(s) Performed: Procedure(s): HERNIA REPAIR INGUINAL ADULT (Left) FINE NEEDLE ASPIRATION NECK MASS (Right)  Patient Location: PACU  Anesthesia Type:Spinal  Level of Consciousness: awake and patient cooperative  Airway & Oxygen Therapy: Patient Spontanous Breathing and Patient connected to face mask oxygen  Post-op Assessment: Report given to PACU RN, Post -op Vital signs reviewed and stable and Patient moving all extremities  Post vital signs: Reviewed and stable  Complications: No apparent anesthesia complications

## 2013-12-16 NOTE — Anesthesia Preprocedure Evaluation (Signed)
Anesthesia Evaluation  Patient identified by MRN, date of birth, ID band Patient awake    Reviewed: Allergy & Precautions, H&P , NPO status , Patient's Chart, lab work & pertinent test results  Airway Mallampati: II TM Distance: >3 FB     Dental  (+) Edentulous Upper, Partial Lower   Pulmonary shortness of breath and with exertion, COPDformer smoker,  breath sounds clear to auscultation        Cardiovascular hypertension, Pt. on medications +CHF + dysrhythmias Atrial Fibrillation Rhythm:Irregular Rate:Normal     Neuro/Psych PSYCHIATRIC DISORDERS Anxiety    GI/Hepatic PUD,   Endo/Other    Renal/GU      Musculoskeletal  (+) Arthritis -,   Abdominal   Peds  Hematology   Anesthesia Other Findings   Reproductive/Obstetrics                           Anesthesia Physical Anesthesia Plan  ASA: III  Anesthesia Plan: Spinal   Post-op Pain Management:    Induction:   Airway Management Planned: Nasal Cannula  Additional Equipment:   Intra-op Plan:   Post-operative Plan:   Informed Consent: I have reviewed the patients History and Physical, chart, labs and discussed the procedure including the risks, benefits and alternatives for the proposed anesthesia with the patient or authorized representative who has indicated his/her understanding and acceptance.     Plan Discussed with:   Anesthesia Plan Comments:         Anesthesia Quick Evaluation

## 2013-12-16 NOTE — Consult Note (Signed)
The patient was seen and examined, and I agree with the assessment and plan as documented above, with modifications as noted below. Pt with nonischemic cardiomyopathy, EF 45-85%, grade 2 diastolic dysfunction, moderate mitral regurgitation and atrial fibrillation who is s/p left inguinal hernia repair and FNA of right cervical lymph node. Asked by Dr. Romona Curls regarding institution of Xarelto for thromboembolic prophylaxis post-surgery. While there are no clear randomized data-driven recommendations per se, in general, it can be started when it is normally deemed feasible to begin full dose enoxaparin. K. Lawrence NP reportedly spoke with Dr. Domenic Polite, who recommends commencing therapy on 12/19/13 at a full dose of 20 mg daily, which I feel is a reasonable strategy. He is symptomatically and hemodynamically stable from a cardiovascular standpoint. Pt and family member are understanding of plan.

## 2013-12-16 NOTE — Anesthesia Procedure Notes (Addendum)
Spinal  Patient location during procedure: OR Start time: 12/16/2013 11:35 AM Staffing CRNA/Resident: IDACAVAGE, Sparkman Preanesthetic Checklist Completed: patient identified, site marked, surgical consent, pre-op evaluation, timeout performed, IV checked, risks and benefits discussed and monitors and equipment checked Spinal Block Patient position: left lateral decubitus Prep: Betadine Patient monitoring: heart rate, cardiac monitor, continuous pulse ox and blood pressure Approach: left paramedian Location: L2-3 Injection technique: single-shot Needle Needle type: Spinocan  Needle gauge: 22 G Assessment Sensory level: T8 Additional Notes CSF clean and brisk     [id number]      11/2014  Date/Time: 12/16/2013 12:11 PM Performed by: Vista Deck Pre-anesthesia Checklist: Patient identified, Emergency Drugs available, Suction available, Timeout performed and Patient being monitored Patient Re-evaluated:Patient Re-evaluated prior to inductionOxygen Delivery Method: Non-rebreather mask

## 2013-12-16 NOTE — Brief Op Note (Signed)
12/16/2013  1:26 PM  PATIENT:  Charles [name]  73 y.o. male  PRE-OPERATIVE DIAGNOSIS:  left inguinal hernia,recurrent cervical adenopathy  POST-OPERATIVE DIAGNOSIS:  left inguinal hernia,recurrent cervical adenopathy  PROCEDURE:  Procedure(s): HERNIA REPAIR INGUINAL ADULT (Left) FINE NEEDLE ASPIRATION NECK MASS (Right)  SURGEON:  Surgeon(s) and Role:    * Scherry Ran, MD - Primary  PHYSICIAN ASSISTANT:   ASSISTANTS: none   ANESTHESIA:   spinal  EBL:  Total I/O In: 1200 [I.V.:1200] Out: -   BLOOD ADMINISTERED:none  DRAINS: none   LOCAL MEDICATIONS USED:  MARCAINE     SPECIMEN:  Source of Specimen:  1.  Left inguinal hernia sca.  2.  FNA from right cervical lymph node.  DISPOSITION OF SPECIMEN:  PATHOLOGY  COUNTS:  YES  TOURNIQUET:  * No tourniquets in log *  DICTATION: .Other Dictation: Dictation Number OR dict # [id number].  PLAN OF CARE: Admit for overnight observation  PATIENT DISPOSITION:  PACU - hemodynamically stable.   Delay start of Pharmacological VTE agent (>24hrs) due to surgical blood loss or risk of bleeding: not applicable

## 2013-12-16 NOTE — Progress Notes (Signed)
Post OP Check  Filed Vitals:   12/16/13 1500  BP: 103/74  Pulse: 52  Temp: 97.4 F (36.3 C)  Resp: 16    Awake and alert.  Dressings dry and in tact.  Will D/C foley when ambulating well.  Minimal discomfort expressed.  As discussed with cardiology, pt will reinitiate anticoagulation therapy Mon.  Pt doing well post op.

## 2013-12-16 NOTE — Telephone Encounter (Signed)
Per K.Lawrence NP, rx placed for Xarelto 20 mg daily at dinner # 30 RF:6 to start on Monday 12/19/13

## 2013-12-16 NOTE — Progress Notes (Signed)
39 yr. Old W. Male with hx of atrial fib and non hodgkin's lymphoma for repair of Left inguinal hernia and for FNA of recurrent Right cervical adenopathy.  Pt was seen and cleared pre op by cardiology and med service.  He has been off his coumadin for 5 days and his INR  1.12.  He was going to have some dental work done pre op but Pharmacist, community decided not to pull his tooth.  Filed Vitals:   12/16/13 1050  BP: 112/73  Pulse:   Temp:   Resp: 21  HR 91, temp 97.7  Clinically no change in H&P, dict # [id number].

## 2013-12-17 DIAGNOSIS — K4090 Unilateral inguinal hernia, without obstruction or gangrene, not specified as recurrent: Secondary | ICD-10-CM | POA: Diagnosis not present

## 2013-12-17 NOTE — Progress Notes (Signed)
POD # 1  Filed Vitals:   12/17/13 0952  BP: 123/69  Pulse: 80  Temp:   Resp:   temp 98.6,resp 20  Wound clean and dry and redressed.   Pt voiding well.  No obvious problems from spinal.  Doing well post op.  Will begin anticoagulation Mon as discussed with cardiology post op.  Discharge dictated, dict.# [id number].

## 2013-12-17 NOTE — Discharge Summary (Signed)
NAME:  Charles [name], Charles [name] NO.:  [id number]  MEDICAL RECORD NO.:  [id number]  LOCATION:  S827                          FACILITY:  [facility]  PHYSICIAN:  Felicie Morn, M.D. DATE OF BIRTH:  1940-08-15  DATE OF ADMISSION:  12/16/2013 DATE OF DISCHARGE:  09/26/2015LH                              DISCHARGE SUMMARY   PROCEDURE: 1. Left inguinal herniorrhaphy. 2. Fine-needle aspiration of recurrent lymphadenopathy in the right     cervical area.  NOTE:  This is a 73 year old white male who had a history of non- Hodgkin's lymphoma as well as having  a rather large left inguinal hernia.  He also has a history of atrial fibrillation.  He was seen preoperatively by the Cardiology Service and he was taken off his Coumadin for 5 days preoperatively and was admitted via the outpatient department.  At which time, we coordinated a fine-needle aspiration with the pathology department  as well as an elective repair of his symptomatic large left inguinal hernia.  This took place without any problems and on the second postoperative day, his Foley catheter was removed as he had spinal anesthesia.  His wound was clean without any signs of infection.  He had no shortness of breath or problems or unusual leg pain that he did not have already.  No previous history of chest pain.  He was discharged, and he will resume his anticoagulation therapy as instructed by the  cardiologist.  This will begin on Monday.  He is to call me should he have any problems and we will followup with him in a week.  Discharge and followup was arranged.  He did very well postoperatively and he is to return to follow up with the Oncology Clinic and his regular doctor.     Felicie Morn, M.D.     WB/MEDQ  D:  12/17/2013  T:  12/17/2013  Job:  [id number]  cc:   Estill Bamberg. Karie Kirks, M.D. Fax: [phone]  Satira Sark, MD  Dr. Barnet Glasgow

## 2013-12-17 NOTE — Op Note (Signed)
NAME:  Charles [name], Charles [name] NO.:  [id number]  MEDICAL RECORD NO.:  [id number]  LOCATION:  F681                          FACILITY:  [facility]  PHYSICIAN:  Felicie Morn, M.D. DATE OF BIRTH:  [birth year]  DATE OF PROCEDURE:  12/16/2013 DATE OF DISCHARGE:                              OPERATIVE REPORT   PROCEDURE: 1. Left inguinal herniorrhaphy (modified McVay repair without mesh). 2. Fine-needle aspiration, right cervical lymph nodes.  DIAGNOSIS: 1. Left inguinal hernia. 2. Recurrent adenopathy of the right cervical area, status post biopsy     in this same area in the past at which time, a non-Hodgkin's     lymphoma was diagnosed.  SURGEON:  Felicie Morn, M.D.  TECHNIQUE:  The patient was placed in the supine position and after the adequate administration of spinal anesthesia, a Foley catheter was aseptically inserted, and he was prepped with Betadine solution and draped in the usual manner.  An incision was carried out between the anterior-superior iliac spine and the pubic tubercle.  The external oblique was opened, the cord structures were dissected free from the hernia sac and large hernia sac was ligated with a purse-string suture and divided.  This was a very vascularized sac and I ligated this with 2- 0 Brilon as well.  Suture ligated.  Then, the hernia repair was then carried out suturing transversus abdominis and transversalis fascia to Cooper's ligament and Poupart's ligament with interrupted sutures.  The cord structures were returned to their anatomic position afterwards and after infiltrating with 0.5% Sensorcaine without epinephrine, this was about 10 mL was used.  The external oblique was repaired with 3-0 Polysorb over the cord structures.  The subcutaneous was approximated with 3-0 Polysorb.  The skin was approximated with a stapling device after irrigating.  Prior to closure; all sponge, needle, and instrument counts were found to be correct.   Estimated blood loss was less than 25 mL.  Patient received around 1200 mL crystalloids intraoperatively. There were no complications.  After sterile dressing was applied to the groin area, attention was then turned to the right cervical area which was prepped with Betadine solution and then using an 18-gauge needle, several passes 5 or so were carried out in the recurrent adenopathy on this side.  This was flushed with saline and the syringe was flushed with saline, and this was sent in a sterile container with a small amount of saline in it as well.  A core biopsy was not done.  This was a fine-needle aspiration with an 18-gauge needle as discussed preoperatively with Dr. Nicoletta Dress of the Pathology Department.  After this was done, I checked for any hematoma in this area.  Sterile dressing was applied, and the patient was taken to the recovery room in satisfactory condition.     Felicie Morn, M.D.     WB/MEDQ  D:  12/16/2013  T:  12/17/2013  Job:  [id number]  cc:   Meda Coffee, Dr.  Oncology Department  Satira Sark, MD

## 2013-12-17 NOTE — Anesthesia Postprocedure Evaluation (Signed)
  Anesthesia Post-op Note  Patient: Charles [name]  Procedure(s) Performed: Procedure(s): HERNIA REPAIR INGUINAL ADULT (Left) FINE NEEDLE ASPIRATION NECK MASS (Right)  Patient Location: room 323  Anesthesia Type:Spinal  Level of Consciousness: awake, alert , oriented and patient cooperative  Airway and Oxygen Therapy: Patient Spontanous Breathing and Patient connected to nasal cannula oxygen  Post-op Pain: 3 /10, mild  Post-op Assessment: Post-op Vital signs reviewed, Patient's Cardiovascular Status Stable, Respiratory Function Stable, Patent Airway and Pain level controlled  Post-op Vital Signs: Reviewed and stable  Last Vitals:  Filed Vitals:   12/17/13 0952  BP: 123/69  Pulse: 80  Temp:   Resp:     Complications: No apparent anesthesia complications

## 2013-12-19 ENCOUNTER — Encounter (HOSPITAL_COMMUNITY): Payer: Self-pay | Admitting: General Surgery

## 2013-12-22 NOTE — Care Management Utilization Note (Signed)
UR completed-OIB

## 2013-12-23 ENCOUNTER — Other Ambulatory Visit (HOSPITAL_COMMUNITY): Payer: Self-pay

## 2013-12-23 DIAGNOSIS — C8290 Follicular lymphoma, unspecified, unspecified site: Secondary | ICD-10-CM

## 2013-12-26 ENCOUNTER — Encounter (HOSPITAL_COMMUNITY): Payer: Medicare Other | Attending: Oncology

## 2013-12-26 ENCOUNTER — Encounter (HOSPITAL_COMMUNITY): Payer: Medicare Other

## 2013-12-26 ENCOUNTER — Encounter (HOSPITAL_COMMUNITY): Payer: Self-pay

## 2013-12-26 VITALS — BP 117/70 | HR 70 | Temp 98.3°F | Resp 18 | Wt 192.6 lb

## 2013-12-26 DIAGNOSIS — C8290 Follicular lymphoma, unspecified, unspecified site: Secondary | ICD-10-CM

## 2013-12-26 DIAGNOSIS — R590 Localized enlarged lymph nodes: Secondary | ICD-10-CM

## 2013-12-26 LAB — CBC WITH DIFFERENTIAL/PLATELET
Basophils Absolute: 0.1 10*3/uL (ref 0.0–0.1)
Basophils Relative: 1 % (ref 0–1)
EOS ABS: 0.3 10*3/uL (ref 0.0–0.7)
EOS PCT: 6 % — AB (ref 0–5)
HCT: 33.9 % — ABNORMAL LOW (ref 39.0–52.0)
Hemoglobin: 11.3 g/dL — ABNORMAL LOW (ref 13.0–17.0)
LYMPHS ABS: 0.7 10*3/uL (ref 0.7–4.0)
Lymphocytes Relative: 13 % (ref 12–46)
MCH: 31.6 pg (ref 26.0–34.0)
MCHC: 33.3 g/dL (ref 30.0–36.0)
MCV: 94.7 fL (ref 78.0–100.0)
Monocytes Absolute: 0.4 10*3/uL (ref 0.1–1.0)
Monocytes Relative: 8 % (ref 3–12)
Neutro Abs: 4.1 10*3/uL (ref 1.7–7.7)
Neutrophils Relative %: 72 % (ref 43–77)
Platelets: 194 10*3/uL (ref 150–400)
RBC: 3.58 MIL/uL — ABNORMAL LOW (ref 4.22–5.81)
RDW: 15 % (ref 11.5–15.5)
WBC: 5.6 10*3/uL (ref 4.0–10.5)

## 2013-12-26 NOTE — Patient Instructions (Signed)
[facility] Discharge Instructions  RECOMMENDATIONS MADE BY THE CONSULTANT AND ANY TEST RESULTS WILL BE SENT TO YOUR REFERRING PHYSICIAN.  EXAM FINDINGS BY THE PHYSICIAN TODAY AND SIGNS OR SYMPTOMS TO REPORT TO CLINIC OR PRIMARY PHYSICIAN: Exam and findings as discussed by Dr. Bubba Hales.  We will make a referral to Dr. Benjamine Mola for evaluation and biopsy of enlarged lymph nodes in your right neck.  Their office will contact you with an appointment.  We will see you after the biopsy is done to determine what to do next.     INSTRUCTIONS/FOLLOW-UP: Follow-up in 3 weeks.    Thank you for choosing [facility] to provide your oncology and hematology care.  To afford each patient quality time with our providers, please arrive at least 15 minutes before your scheduled appointment time.  With your help, our goal is to use those 15 minutes to complete the necessary work-up to ensure our physicians have the information they need to help with your evaluation and healthcare recommendations.    Effective January 1st, 2014, we ask that you re-schedule your appointment with our physicians should you arrive 10 or more minutes late for your appointment.  We strive to give you quality time with our providers, and arriving late affects you and other patients whose appointments are after yours.    Again, thank you for choosing [facility].  Our hope is that these requests will decrease the amount of time that you wait before being seen by our physicians.       _____________________________________________________________  Should you have questions after your visit to [facility], please contact our office at [phone] between the hours of 8:30 a.m. and 4:30 p.m.  Voicemails left after 4:30 p.m. will not be returned until the following business day.  For prescription refill requests, have your pharmacy contact our office with your prescription refill request.     _______________________________________________________________  We hope that we have given you very good care.  You may receive a patient satisfaction survey in the mail, please complete it and return it as soon as possible.  We value your feedback!  _______________________________________________________________  Have you asked about our STAR program?  STAR stands for Survivorship Training and Rehabilitation, and this is a nationally recognized cancer care program that focuses on survivorship and rehabilitation.  Cancer and cancer treatments may cause problems, such as, pain, making you feel tired and keeping you from doing the things that you need or want to do. Cancer rehabilitation can help. Our goal is to reduce these troubling effects and help you have the best quality of life possible.  You may receive a survey from a nurse that asks questions about your current state of health.  Based on the survey results, all eligible patients will be referred to the [facility] program for an evaluation so we can better serve you!  A frequently asked questions sheet is available upon request.

## 2013-12-27 NOTE — Progress Notes (Signed)
[facility] OFFICE PROGRESS NOTE  PCP Charles Bellow, MD [address] [place] [address]  DIAGNOSIS: Follicular Lymphoma                        Recurrent right cervical adenopathy   CURRENT THERAPY:  Completion of systemic therapy April 2014.    INTERVAL HEMATOLOGY/ONCOLOGY HX: [name] had a needle aspiration of a conglomerate of adenopathy involving the right neck. The results were nondiagnostic. The nodes have enlarged since August. He is not experiencing any systemic B symptoms. A right inguinal hernia was recently repaired without evidence for recurrent lymphoma.    MEDICAL HISTORY:  Past Medical History  Diagnosis Date  . Essential hypertension, benign   . Atrial fibrillation 10/2008  . 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 Peptic ulcer disease   . Degenerative joint disease     Knees and shoulders  . Pneumonia 2010  . Allergic rhinitis   . Anxiety   . Drug abuse   . Tobacco abuse     50 pack years  . Mitral regurgitation     Moderate to severe May 2014  . COPD (chronic obstructive pulmonary disease)   . Follicular lymphoma 0/09/6149  . Basal cell carcinoma   . Hernia, inguinal, left   . Arm fracture, right 07/2013  . Hernia, inguinal, left   . Shortness of breath   . Dysrhythmia   . CHF (congestive heart failure)     has TOBACCO ABUSE; Essential hypertension, benign; Atrial fibrillation; Chronic anticoagulation; Follicular lymphoma; Hyperlipidemia; Chronic combined systolic and diastolic CHF (congestive heart failure); Nonischemic cardiomyopathy; Mitral regurgitation; OA (osteoarthritis) of knee; Encounter for therapeutic drug monitoring; Preoperative cardiovascular examination; and Inguinal hernia unilateral, non-recurrent on his problem list.    ALLERGIES:  is allergic to metoprolol and statins.  MEDICATIONS: has a current medication list which  includes the following prescription(s): alprazolam, cholecalciferol, digoxin, diltiazem, docusate sodium, furosemide, hydrocodone-acetaminophen, lovastatin, omeprazole, polyethylene glycol, potassium chloride sa, proair hfa, rivaroxaban, sertraline, simethicone, tiotropium, and tramadol, and the following Facility-Administered Medications: sodium chloride.  FAMILY HISTORY: family history includes Coronary artery disease in his other; Heart attack in his father.  REVIEW OF SYSTEMS:    SINCE YOUR LAST VISIT Been diagnosed or treated for a new medical /surgical  problem or condition: Yes (had hernia repair) Any Recent Xrays or studies performed: No Any new prescription or OTC medications: No ECOG Perf Status: Capable of only limited selfcare, confined to bed or chair more than 50% of waking hours Problems sleeping: Yes Medications taken to help sleep: Yes (xanax) How is your appetie: 100% normal Any Supplements: No Any trouble chewing or swallowing: No Any Nausea or Vomiting: No Any Bowel problems: No # Bowel Movements per week: 7 Any Urinary Issues: No Any Cardiac Problems: No Any Respiratory Issues: No Any Neurological Issues: No Do you live alone: No Feelings hopelessness: Yes You or your family have any concerns or Health changes: No Pain Assessment Pain Score:  (arthritic - type discomfort) Pain Location:  (generalized joint discomfort) Pain Descriptors / Indicators: Aching Pain Frequency: Intermittent Pain Onset: On-going Pain Relieving Factors:  (pain med) Pain Aggravating Factors:  (certain movements) Pain Intervention(s): Medication (See eMAR) Significant Pain in Recent Past: Yes, chronic Effect of Pain on Daily Activities:  (hard to do some things)  Other than that discussed above is noncontributory.  PHYSICAL EXAMINATION:   weight is 192 lb 9.6 oz (87.363 kg). His oral temperature is 98.3 F (36.8 C). His blood pressure is 117/70 and his pulse is 70. His  respiration is 18 and oxygen saturation is 97%.    GENERAL: alert, no distress and comfortable EYES: PERLA; Conjunctiva are pink and non-injected, sclera clear OROPHARYNX: no exudate, no erythema on lips, buccal mucosa, or tongue. NECK: supple, thyroid normal size, non-tender, matted right cervical adenopathy rubbery and not hard. LYMPH:  no palpable lymphadenopathy in the axillary or inguinal areas. LUNGS: clear to auscultation and percussion with normal breathing effort,  HEART: irregular rhythm with controlled rate. no murmurs.  ABDOMEN: abdomen soft, non-tender and no hepatosplenomegaly.  EXTREMiTIES: No edema NEURO: alert & oriented x 3.  LABORATORY DATA: Appointment on 12/26/2013  Component Date Value Ref Range Status  . WBC 12/26/2013 5.6  4.0 - 10.5 K/uL Final  . RBC 12/26/2013 3.58* 4.22 - 5.81 MIL/uL Final  . Hemoglobin 12/26/2013 11.3* 13.0 - 17.0 g/dL Final  . HCT 12/26/2013 33.9* 39.0 - 52.0 % Final  . MCV 12/26/2013 94.7  78.0 - 100.0 fL Final  . MCH 12/26/2013 31.6  26.0 - 34.0 pg Final  . MCHC 12/26/2013 33.3  30.0 - 36.0 g/dL Final  . RDW 12/26/2013 15.0  11.5 - 15.5 % Final  . Platelets 12/26/2013 194  150 - 400 K/uL Final  . Neutrophils Relative % 12/26/2013 72  43 - 77 % Final  . Neutro Abs 12/26/2013 4.1  1.7 - 7.7 K/uL Final  . Lymphocytes Relative 12/26/2013 13  12 - 46 % Final  . Lymphs Abs 12/26/2013 0.7  0.7 - 4.0 K/uL Final  . Monocytes Relative 12/26/2013 8  3 - 12 % Final  . Monocytes Absolute 12/26/2013 0.4  0.1 - 1.0 K/uL Final  . Eosinophils Relative 12/26/2013 6* 0 - 5 % Final  . Eosinophils Absolute 12/26/2013 0.3  0.0 - 0.7 K/uL Final  . Basophils Relative 12/26/2013 1  0 - 1 % Final  . Basophils Absolute 12/26/2013 0.1  0.0 - 0.1 K/uL Final  Admission on 12/16/2013, Discharged on 12/17/2013  Component Date Value Ref Range Status  . Prothrombin Time 12/16/2013 14.4  11.6 - 15.2 seconds Final  . INR 12/16/2013 1.12  0.00 - 1.49 Final  Hospital  Outpatient Visit on 12/13/2013  Component Date Value Ref Range Status  . WBC 12/13/2013 4.9  4.0 - 10.5 K/uL Final  . RBC 12/13/2013 3.93* 4.22 - 5.81 MIL/uL Final  . Hemoglobin 12/13/2013 12.3* 13.0 - 17.0 g/dL Final  . HCT 12/13/2013 37.2* 39.0 - 52.0 % Final  . MCV 12/13/2013 94.7  78.0 - 100.0 fL Final  . MCH 12/13/2013 31.3  26.0 - 34.0 pg Final  . MCHC 12/13/2013 33.1  30.0 - 36.0 g/dL Final  . RDW 12/13/2013 15.2  11.5 - 15.5 % Final  . Platelets 12/13/2013 172  150 - 400 K/uL Final  . Neutrophils Relative % 12/13/2013 73  43 - 77 % Final  . Neutro Abs 12/13/2013 3.6  1.7 - 7.7 K/uL Final  . Lymphocytes Relative 12/13/2013 13  12 - 46 % Final  . Lymphs Abs 12/13/2013 0.6* 0.7 - 4.0 K/uL Final  . Monocytes Relative 12/13/2013 8  3 - 12 % Final  . Monocytes Absolute 12/13/2013 0.4  0.1 - 1.0 K/uL Final  . Eosinophils Relative 12/13/2013 5  0 - 5 % Final  . Eosinophils Absolute 12/13/2013 0.2  0.0 - 0.7  K/uL Final  . Basophils Relative 12/13/2013 1  0 - 1 % Final  . Basophils Absolute 12/13/2013 0.1  0.0 - 0.1 K/uL Final  . Sodium 12/13/2013 142  137 - 147 mEq/L Final  . Potassium 12/13/2013 4.2  3.7 - 5.3 mEq/L Final  . Chloride 12/13/2013 99  96 - 112 mEq/L Final  . CO2 12/13/2013 32  19 - 32 mEq/L Final  . Glucose, Bld 12/13/2013 139* 70 - 99 mg/dL Final  . BUN 12/13/2013 19  6 - 23 mg/dL Final  . Creatinine, Ser 12/13/2013 1.38* 0.50 - 1.35 mg/dL Final  . Calcium 12/13/2013 9.6  8.4 - 10.5 mg/dL Final  . GFR calc non Af Amer 12/13/2013 49* >90 mL/min Final  . GFR calc Af Amer 12/13/2013 57* >90 mL/min Final   Comment: (NOTE)                          The eGFR has been calculated using the CKD EPI equation.                          This calculation has not been validated in all clinical situations.                          eGFR's persistently <90 mL/min signify possible Chronic Kidney                          Disease.  . Anion gap 12/13/2013 11  5 - 15 Final  Anti-coag  visit on 12/12/2013  Component Date Value Ref Range Status  . INR 12/12/2013 1.8   Final    PATHOLOGY: FNA limited lymphoid tissue with degenerative changes and crush artifact. Non-diagnostic.  RADIOGRAPHIC STUDIES: CT scans of soft tissues of the neck, chest, abdomen, and pelvis (11/14/13). See results noted at prior visit.   ASSESSMENT:  1. Right Cervical Lymphadenopathy. In the setting of a prior follicular lymphoma, this probably is recurrent disease. 2. Follicular NHL S/P 6 cycles of chemotherapy (Bendamustine) and Rituxan with maintenance Rituxan ending 12/09/2011 3. PET scan showed complete remission in April 2014.    RECOMMENDATIONS:  1. Refer patient to Dr. Benjamine Mola (ENT) to obtain additional tissue for pathology, either a core or excisional biopsy with lymphoma studies.   All questions were answered. The patient knows to call the clinic with any problems, questions or concerns. We can certainly see the patient much sooner if necessary.    Darrall Dears, MD 12/27/2013 10:15 PM

## 2013-12-28 ENCOUNTER — Other Ambulatory Visit (HOSPITAL_COMMUNITY): Payer: Medicare Other

## 2013-12-28 ENCOUNTER — Encounter (HOSPITAL_COMMUNITY): Payer: Self-pay | Admitting: Lab

## 2013-12-28 ENCOUNTER — Ambulatory Visit (HOSPITAL_COMMUNITY): Payer: Medicare Other

## 2013-12-28 NOTE — Progress Notes (Signed)
Referral made to [facility] on 10/15.  Records faxed to them on 10/7

## 2014-01-05 ENCOUNTER — Telehealth: Payer: Self-pay | Admitting: Cardiology

## 2014-01-05 ENCOUNTER — Ambulatory Visit (INDEPENDENT_AMBULATORY_CARE_PROVIDER_SITE_OTHER): Payer: Medicare Other | Admitting: Otolaryngology

## 2014-01-05 DIAGNOSIS — C8291 Follicular lymphoma, unspecified, lymph nodes of head, face, and neck: Secondary | ICD-10-CM

## 2014-01-05 NOTE — Telephone Encounter (Signed)
            What kind of biopsy? Need to clarify this with Dr. Cathleen Fears office. Ask them specifically if this needs to be done off anticoagulation, I suspect that does. If this is the case, he would need to be off XArelto for 24-48 hours prior. Please clarify and document in chart.      Per Dr.Teoh's Linna Hoff office,pt is having incisional biopsy right neck mass on 01/23/14 and expects patient to hold Xarelto. I faxed letter stating patient should hold Xarelto 24-48 hrs prior.I have left message for patient to call back to receive instructions

## 2014-01-05 NOTE — Telephone Encounter (Signed)
Patient to have biopsy by Dr.Teoh on 11/2.  Needs to know how long to be off of Xarelto prior to procedure. / tgs

## 2014-01-09 ENCOUNTER — Ambulatory Visit (HOSPITAL_COMMUNITY): Payer: Medicare Other

## 2014-01-09 ENCOUNTER — Other Ambulatory Visit: Payer: Self-pay | Admitting: Otolaryngology

## 2014-01-10 NOTE — Progress Notes (Signed)
This encounter was created in error - please disregard.

## 2014-01-16 ENCOUNTER — Encounter (HOSPITAL_COMMUNITY): Payer: Self-pay | Admitting: Emergency Medicine

## 2014-01-16 ENCOUNTER — Emergency Department (HOSPITAL_COMMUNITY): Payer: Medicare Other

## 2014-01-16 ENCOUNTER — Inpatient Hospital Stay (HOSPITAL_COMMUNITY)
Admission: EM | Admit: 2014-01-16 | Discharge: 2014-01-19 | DRG: 291 | Disposition: A | Discharge status: Home Health | Payer: Medicare Other | Attending: Family Medicine | Admitting: Family Medicine

## 2014-01-16 ENCOUNTER — Other Ambulatory Visit: Payer: Self-pay

## 2014-01-16 ENCOUNTER — Ambulatory Visit (INDEPENDENT_AMBULATORY_CARE_PROVIDER_SITE_OTHER): Payer: Medicare Other | Admitting: *Deleted

## 2014-01-16 DIAGNOSIS — K4090 Unilateral inguinal hernia, without obstruction or gangrene, not specified as recurrent: Secondary | ICD-10-CM | POA: Diagnosis present

## 2014-01-16 DIAGNOSIS — Z79899 Other long term (current) drug therapy: Secondary | ICD-10-CM

## 2014-01-16 DIAGNOSIS — R0602 Shortness of breath: Secondary | ICD-10-CM | POA: Diagnosis present

## 2014-01-16 DIAGNOSIS — I429 Cardiomyopathy, unspecified: Secondary | ICD-10-CM | POA: Diagnosis present

## 2014-01-16 DIAGNOSIS — Z87891 Personal history of nicotine dependence: Secondary | ICD-10-CM | POA: Diagnosis not present

## 2014-01-16 DIAGNOSIS — Z79891 Long term (current) use of opiate analgesic: Secondary | ICD-10-CM | POA: Diagnosis not present

## 2014-01-16 DIAGNOSIS — J449 Chronic obstructive pulmonary disease, unspecified: Secondary | ICD-10-CM | POA: Diagnosis present

## 2014-01-16 DIAGNOSIS — E785 Hyperlipidemia, unspecified: Secondary | ICD-10-CM | POA: Diagnosis present

## 2014-01-16 DIAGNOSIS — K5901 Slow transit constipation: Secondary | ICD-10-CM | POA: Diagnosis present

## 2014-01-16 DIAGNOSIS — M179 Osteoarthritis of knee, unspecified: Secondary | ICD-10-CM | POA: Diagnosis present

## 2014-01-16 DIAGNOSIS — I10 Essential (primary) hypertension: Secondary | ICD-10-CM | POA: Diagnosis present

## 2014-01-16 DIAGNOSIS — G4700 Insomnia, unspecified: Secondary | ICD-10-CM | POA: Diagnosis present

## 2014-01-16 DIAGNOSIS — I482 Chronic atrial fibrillation, unspecified: Secondary | ICD-10-CM

## 2014-01-16 DIAGNOSIS — I509 Heart failure, unspecified: Secondary | ICD-10-CM

## 2014-01-16 DIAGNOSIS — Z96611 Presence of right artificial shoulder joint: Secondary | ICD-10-CM | POA: Diagnosis present

## 2014-01-16 DIAGNOSIS — Z66 Do not resuscitate: Secondary | ICD-10-CM | POA: Diagnosis present

## 2014-01-16 DIAGNOSIS — Z8572 Personal history of non-Hodgkin lymphomas: Secondary | ICD-10-CM | POA: Diagnosis not present

## 2014-01-16 DIAGNOSIS — M1710 Unilateral primary osteoarthritis, unspecified knee: Secondary | ICD-10-CM | POA: Diagnosis present

## 2014-01-16 DIAGNOSIS — I5043 Acute on chronic combined systolic (congestive) and diastolic (congestive) heart failure: Secondary | ICD-10-CM | POA: Diagnosis present

## 2014-01-16 DIAGNOSIS — J9601 Acute respiratory failure with hypoxia: Secondary | ICD-10-CM | POA: Diagnosis present

## 2014-01-16 DIAGNOSIS — Z7901 Long term (current) use of anticoagulants: Secondary | ICD-10-CM | POA: Diagnosis not present

## 2014-01-16 DIAGNOSIS — R109 Unspecified abdominal pain: Secondary | ICD-10-CM

## 2014-01-16 DIAGNOSIS — I4891 Unspecified atrial fibrillation: Secondary | ICD-10-CM | POA: Diagnosis present

## 2014-01-16 DIAGNOSIS — F319 Bipolar disorder, unspecified: Secondary | ICD-10-CM | POA: Diagnosis present

## 2014-01-16 DIAGNOSIS — K5900 Constipation, unspecified: Secondary | ICD-10-CM | POA: Diagnosis present

## 2014-01-16 DIAGNOSIS — Z8249 Family history of ischemic heart disease and other diseases of the circulatory system: Secondary | ICD-10-CM | POA: Diagnosis not present

## 2014-01-16 DIAGNOSIS — Z85820 Personal history of malignant melanoma of skin: Secondary | ICD-10-CM | POA: Diagnosis not present

## 2014-01-16 DIAGNOSIS — I5021 Acute systolic (congestive) heart failure: Secondary | ICD-10-CM

## 2014-01-16 LAB — CBC WITH DIFFERENTIAL/PLATELET
BASOS PCT: 1 % (ref 0–1)
Basophils Absolute: 0.1 10*3/uL (ref 0.0–0.1)
EOS ABS: 0.3 10*3/uL (ref 0.0–0.7)
Eosinophils Relative: 5 % (ref 0–5)
HCT: 35.9 % — ABNORMAL LOW (ref 39.0–52.0)
Hemoglobin: 11.8 g/dL — ABNORMAL LOW (ref 13.0–17.0)
Lymphocytes Relative: 11 % — ABNORMAL LOW (ref 12–46)
Lymphs Abs: 0.7 10*3/uL (ref 0.7–4.0)
MCH: 31.1 pg (ref 26.0–34.0)
MCHC: 32.9 g/dL (ref 30.0–36.0)
MCV: 94.7 fL (ref 78.0–100.0)
MONO ABS: 0.4 10*3/uL (ref 0.1–1.0)
MONOS PCT: 7 % (ref 3–12)
NEUTROS ABS: 4.7 10*3/uL (ref 1.7–7.7)
NEUTROS PCT: 76 % (ref 43–77)
Platelets: 175 10*3/uL (ref 150–400)
RBC: 3.79 MIL/uL — ABNORMAL LOW (ref 4.22–5.81)
RDW: 15.8 % — AB (ref 11.5–15.5)
WBC: 6.2 10*3/uL (ref 4.0–10.5)

## 2014-01-16 LAB — COMPREHENSIVE METABOLIC PANEL
ALT: 9 U/L (ref 0–53)
AST: 19 U/L (ref 0–37)
Albumin: 4.1 g/dL (ref 3.5–5.2)
Alkaline Phosphatase: 94 U/L (ref 39–117)
Anion gap: 12 (ref 5–15)
BILIRUBIN TOTAL: 0.8 mg/dL (ref 0.3–1.2)
BUN: 22 mg/dL (ref 6–23)
CALCIUM: 9.9 mg/dL (ref 8.4–10.5)
CO2: 28 meq/L (ref 19–32)
Chloride: 100 mEq/L (ref 96–112)
Creatinine, Ser: 1.37 mg/dL — ABNORMAL HIGH (ref 0.50–1.35)
GFR calc Af Amer: 57 mL/min — ABNORMAL LOW (ref >90)
GFR, EST NON AFRICAN AMERICAN: 49 mL/min — AB (ref >90)
Glucose, Bld: 101 mg/dL — ABNORMAL HIGH (ref 70–99)
Potassium: 4.2 mEq/L (ref 3.7–5.3)
SODIUM: 140 meq/L (ref 137–147)
Total Protein: 7.7 g/dL (ref 6.0–8.3)

## 2014-01-16 LAB — URINALYSIS, ROUTINE W REFLEX MICROSCOPIC
BILIRUBIN URINE: NEGATIVE
Glucose, UA: NEGATIVE mg/dL
HGB URINE DIPSTICK: NEGATIVE
Ketones, ur: NEGATIVE mg/dL
Leukocytes, UA: NEGATIVE
NITRITE: NEGATIVE
Protein, ur: NEGATIVE mg/dL
Specific Gravity, Urine: 1.015 (ref 1.005–1.030)
UROBILINOGEN UA: 0.2 mg/dL (ref 0.0–1.0)
pH: 5.5 (ref 5.0–8.0)

## 2014-01-16 LAB — LIPASE, BLOOD: Lipase: 10 U/L — ABNORMAL LOW (ref 11–59)

## 2014-01-16 LAB — TROPONIN I: Troponin I: <0.3 ng/mL (ref <0.30)

## 2014-01-16 LAB — PRO B NATRIURETIC PEPTIDE: Pro B Natriuretic peptide (BNP): 6849 pg/mL — ABNORMAL HIGH (ref 0–125)

## 2014-01-16 MED ORDER — SERTRALINE HCL 50 MG PO TABS
50.0000 mg | ORAL_TABLET | Freq: Every day | ORAL | Status: DC
  Administered 2014-01-16 – 2014-01-18 (×3): 50 mg via ORAL
  Filled 2014-01-16 (×3): qty 1

## 2014-01-16 MED ORDER — ONDANSETRON HCL 4 MG/2ML IJ SOLN: 4.0000 mg | Freq: Three times a day (TID) | INTRAMUSCULAR | Status: AC | PRN

## 2014-01-16 MED ORDER — ALPRAZOLAM 1 MG PO TABS
1.0000 mg | ORAL_TABLET | Freq: Every day | ORAL | Status: DC
  Administered 2014-01-16 – 2014-01-18 (×3): 1 mg via ORAL
  Filled 2014-01-16: qty 1
  Filled 2014-01-16: qty 2
  Filled 2014-01-16: qty 1

## 2014-01-16 MED ORDER — TIOTROPIUM BROMIDE MONOHYDRATE 18 MCG IN CAPS
18.0000 ug | ORAL_CAPSULE | Freq: Every day | RESPIRATORY_TRACT | Status: DC | PRN
  Filled 2014-01-16: qty 5

## 2014-01-16 MED ORDER — FUROSEMIDE 10 MG/ML IJ SOLN
40.0000 mg | INTRAMUSCULAR | Status: AC
  Administered 2014-01-16: 40 mg via INTRAVENOUS
  Filled 2014-01-16: qty 4

## 2014-01-16 MED ORDER — PRAVASTATIN SODIUM 10 MG PO TABS
10.0000 mg | ORAL_TABLET | Freq: Every day | ORAL | Status: DC
  Administered 2014-01-16 – 2014-01-18 (×3): 10 mg via ORAL
  Filled 2014-01-16 (×3): qty 1

## 2014-01-16 MED ORDER — RIVAROXABAN 20 MG PO TABS
20.0000 mg | ORAL_TABLET | Freq: Every day | ORAL | Status: DC
  Administered 2014-01-16 – 2014-01-18 (×3): 20 mg via ORAL
  Filled 2014-01-16 (×3): qty 1

## 2014-01-16 MED ORDER — POTASSIUM CHLORIDE CRYS ER 20 MEQ PO TBCR
20.0000 meq | EXTENDED_RELEASE_TABLET | Freq: Two times a day (BID) | ORAL | Status: DC
  Administered 2014-01-16 – 2014-01-19 (×6): 20 meq via ORAL
  Filled 2014-01-16 (×6): qty 1

## 2014-01-16 MED ORDER — SODIUM CHLORIDE 0.9 % IV SOLN: 250.0000 mL | INTRAVENOUS | Status: DC | PRN

## 2014-01-16 MED ORDER — IOHEXOL 300 MG/ML  SOLN
50.0000 mL | Freq: Once | INTRAMUSCULAR | Status: AC | PRN
Start: 2014-01-16 — End: 2014-01-16
  Administered 2014-01-16: 50 mL via ORAL

## 2014-01-16 MED ORDER — FUROSEMIDE 10 MG/ML IJ SOLN
40.0000 mg | Freq: Two times a day (BID) | INTRAMUSCULAR | Status: DC
  Administered 2014-01-17 – 2014-01-18 (×3): 40 mg via INTRAVENOUS
  Filled 2014-01-16 (×3): qty 4

## 2014-01-16 MED ORDER — BISACODYL 10 MG RE SUPP
10.0000 mg | Freq: Once | RECTAL | Status: AC
  Administered 2014-01-16: 10 mg via RECTAL
  Filled 2014-01-16: qty 1

## 2014-01-16 MED ORDER — DILTIAZEM HCL ER COATED BEADS 180 MG PO CP24
180.0000 mg | ORAL_CAPSULE | Freq: Every day | ORAL | Status: DC
  Administered 2014-01-17 – 2014-01-19 (×3): 180 mg via ORAL
  Filled 2014-01-16 (×3): qty 1

## 2014-01-16 MED ORDER — ALBUTEROL SULFATE (2.5 MG/3ML) 0.083% IN NEBU: 2.5000 mg | INHALATION_SOLUTION | RESPIRATORY_TRACT | Status: DC | PRN

## 2014-01-16 MED ORDER — SODIUM CHLORIDE 0.9 % IJ SOLN: 3.0000 mL | INTRAMUSCULAR | Status: DC | PRN

## 2014-01-16 MED ORDER — DIGOXIN 125 MCG PO TABS
0.1250 mg | ORAL_TABLET | Freq: Every morning | ORAL | Status: DC
  Administered 2014-01-17 – 2014-01-19 (×3): 0.125 mg via ORAL
  Filled 2014-01-16 (×3): qty 1

## 2014-01-16 MED ORDER — HYDROCODONE-ACETAMINOPHEN 10-325 MG PO TABS
1.0000 | ORAL_TABLET | Freq: Four times a day (QID) | ORAL | Status: DC | PRN
  Administered 2014-01-16 – 2014-01-19 (×7): 1 via ORAL
  Filled 2014-01-16 (×7): qty 1

## 2014-01-16 MED ORDER — SODIUM CHLORIDE 0.9 % IJ SOLN
3.0000 mL | Freq: Two times a day (BID) | INTRAMUSCULAR | Status: DC
  Administered 2014-01-16 – 2014-01-19 (×6): 3 mL via INTRAVENOUS

## 2014-01-16 MED ORDER — POLYETHYLENE GLYCOL 3350 17 G PO PACK
17.0000 g | PACK | Freq: Every day | ORAL | Status: DC
  Administered 2014-01-17 – 2014-01-18 (×2): 17 g via ORAL
  Filled 2014-01-16 (×3): qty 1

## 2014-01-16 NOTE — Plan of Care (Signed)
Problem: Phase I Progression Outcomes Goal: EF % per last Echo/documented,Core Reminder form on chart Outcome: Completed/Met Date Met:  01/16/14 EF 35-40% Goal: Voiding-avoid urinary catheter unless indicated Foley catheter for aggressive IV diuresis

## 2014-01-16 NOTE — ED Notes (Signed)
Pt sleeping and 02 sat decreased to 82%.  Place pt on 02 at 2liters and sat increased to 90%.  Notified Dr. Sabra Heck.

## 2014-01-16 NOTE — ED Notes (Signed)
Removed pt's 02 to see if o2 sat would decreased when pt walked.  Pt stood up to use urinal at bedside and when pt finished, sat decreased to 84%.   Put 02 at 2 liters back on pt via [place].

## 2014-01-16 NOTE — H&P (Signed)
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 Patient recently underwent left inguinal herniorrhaphy and was discharged from the hospital on 12/17/2013. Patient has history of CHF and was taking Lasix 40 mg twice a day, which as per wife he cut down to once a day 2 weeks ago. Since then patient noticed that his breathing has become more labored and developed dyspnea on exertion along with orthopnea. He denies any fever, no cough. He does have history of COPD and takes start therapy and at home.  He denies chest pain, patient also had abdominal pain. X-ray of the abdomen showed prominent proximal small bowel loops in the left upper quadrant up to 4 cm in diameter with questionable of small bowel obstruction. CT abdomen and pelvis was done which did not show any bowel obstruction, but showed moderate volume of stool throughout the colon. Patient takes Vicodin 4 times a day for the knee osteoarthritis. He also takes MiraLAX once a day along with  Enema, but has not had good bowel movement over the past few days. In the ED patient was found to be hypoxic on exertion with O2 sats dropping to 84% on room air. Chest x-ray shows pulmonary vascular congestion. He does have a history of grade 2 diastolic dysfunction last echo from March 2015 showed EF 45-50%.  Allergies:   Allergies  Allergen Reactions  . Metoprolol Shortness Of Breath   Intolerant to all BB  . Statins     Intolerant secondary to severe myalgias      Past Medical History  Diagnosis Date  . Essential hypertension, benign   . Atrial fibrillation 10/2008  . 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 Peptic ulcer disease   . Degenerative joint disease     Knees and shoulders  . Pneumonia 2010  . Allergic rhinitis   . Anxiety   . Drug abuse   . Tobacco abuse     50 pack years  . Mitral regurgitation     Moderate to severe May 2014  . COPD (chronic obstructive pulmonary disease)   . Hernia, inguinal, left   . Arm fracture, right 07/2013  . Hernia, inguinal, left   . Shortness of breath   . Dysrhythmia   . CHF (congestive heart failure)   . Follicular lymphoma 05/27/1441  . Basal cell carcinoma     Past Surgical History  Procedure Laterality Date  . Neck lesion biopsy  June 24, 1538    Follicular lymphoma  . Skin cancer excision      under left breast; variously described as melanoma and basal cell  . Rotator cuff repair      right  . Patella fracture surgery Left 1975  . Hematoma evacuation      after melanoma removal  . Portacath placement  07/12/2010  . Colonoscopy  2009    Also underwent an EGD; patient reports no significant findings  . Bone marrow aspiration  06/2012  . Bone marrow biopsy  06/2012  . Fungal  infection    . Port-a-cath removal Left 01/17/2013    Procedure: REMOVAL PORT-A-CATH;  Surgeon: Scherry Ran, MD;  Location: [facility];  Service: General;  Laterality: Left;  . Inguinal hernia repair Left 12/16/2013    Procedure: HERNIA REPAIR INGUINAL ADULT;  Surgeon: Scherry Ran, MD;  Location: [facility];  Service: General;  Laterality: Left;  Marland Kitchen Mass biopsy Right 12/16/2013    Procedure: FINE NEEDLE ASPIRATION NECK MASS;  Surgeon: Scherry Ran, MD;  Location: [facility];  Service: General;  Laterality: Right;    Prior to Admission medications   Medication Sig  Start Date End Date Taking? Authorizing Provider  ALPRAZolam Duanne Moron) 1 MG tablet Take 0.5-1 mg by mouth 3 (three) times daily. Takes one half table three times daily and one whole tablet at bedtime   Yes Historical Provider, MD  cholecalciferol (VITAMIN D) 1000 UNITS tablet Take 1,000 Units by mouth every morning.   Yes Historical Provider, MD  digoxin (LANOXIN) 0.125 MG tablet Take 0.125 mg by mouth every morning.   Yes Historical Provider, MD  diltiazem (CARDIZEM CD) 180 MG 24 hr capsule Take 1 capsule (180 mg total) by mouth daily. 06/08/13  Yes Lendon Colonel, NP  docusate sodium (COLACE) 50 MG capsule Take 50-100 mg by mouth every morning.    Yes Historical Provider, MD  furosemide (LASIX) 80 MG tablet Take 80 mg by mouth 2 (two) times daily.   Yes Historical Provider, MD  HYDROcodone-acetaminophen (NORCO) 10-325 MG per tablet Take 1 tablet by mouth every 6 (six) hours as needed for moderate pain.   Yes Historical Provider, MD  lovastatin (MEVACOR) 10 MG tablet Take 10 mg by mouth at bedtime.    Yes Historical Provider, MD  omeprazole (PRILOSEC) 20 MG capsule Take 20 mg by mouth every morning.    Yes Historical Provider, MD  polyethylene glycol (MIRALAX / GLYCOLAX) packet Take 17 g by mouth daily as needed for mild constipation or moderate constipation. Takes 3 times a week   Yes Historical Provider, MD  potassium chloride SA (K-DUR,KLOR-CON) 20 MEQ tablet Take 20 mEq by mouth 2 (two) times daily. 07/08/13  Yes Nimish C Anastasio Champion, MD  PROAIR HFA 108 (90 BASE) MCG/ACT inhaler Inhale 1 puff into the lungs every 6 (six) hours as needed. For shortness of breath 01/15/12  Yes Historical Provider, MD  rivaroxaban (XARELTO) 20 MG TABS tablet Take 1 tablet (20 mg total) by mouth daily with supper. 12/16/13  Yes Lendon Colonel, NP  sertraline (ZOLOFT) 50 MG tablet Take 50 mg by mouth at bedtime. 08/13/12  Yes Angus Ailene Ravel, MD  Simethicone (PHAZYME PO) Take 1 capsule by mouth daily as needed (for gas  relief/stomach symptoms).   Yes Historical Provider, MD  tiotropium (SPIRIVA) 18 MCG inhalation capsule Place 18 mcg into inhaler and inhale daily as needed (for shortness of breath).    Yes Historical Provider, MD  traMADol (ULTRAM) 50 MG tablet Take 50 mg by mouth every 6 (six) hours as needed.   Yes Historical Provider, MD    Social History:  reports that he quit smoking about 21 months ago. His smoking use included Cigarettes. He has a 50 pack-year smoking history. He has never used smokeless tobacco. He reports that he does not drink alcohol or use illicit drugs.  Family History  Problem Relation Age of Onset  . Heart attack Father   . Coronary artery disease Other      All the positives are listed  in BOLD  Review of Systems:  HEENT: Headache, blurred vision, runny nose, sore throat Neck: Hypothyroidism, hyperthyroidism,,lymphadenopathy Chest : Shortness of breath, history of COPD, Asthma Heart : Chest pain, history of coronary arterey disease, atrial fibrillation GI:  Nausea, vomiting, diarrhea, constipation, GERD GU: Dysuria, urgency, frequency of urination, hematuria Neuro: Stroke, seizures, syncope Psych: Depression, anxiety, hallucinations   Physical Exam: Blood pressure 121/85, pulse 80, temperature 97.6 F (36.4 C), temperature source Oral, resp. rate 20, height _0  (1.702 m), weight 82.555 kg (182 lb), SpO2 88.00%. Constitutional:   Patient is a well-developed and well-nourished male* in no acute distress and cooperative with exam. Head: Normocephalic and atraumatic Mouth: Mucus membranes moist Eyes: PERRL, EOMI, conjunctivae normal Neck: Supple, No Thyromegaly Cardiovascular: RRR, S1 normal, S2 normal Pulmonary/Chest: Bibasilar crackles Abdominal: Soft. Non-tender, non-distended, bowel sounds are normal, no masses, organomegaly, or guarding present.  Neurological: A&O x3, Strenght is normal and symmetric bilaterally, cranial nerve II-XII are grossly intact, no  focal motor deficit, sensory intact to light touch bilaterally.  Extremities : No Cyanosis, Clubbing or Edema  Labs on Admission:  Basic Metabolic Panel:  Recent Labs Lab 01/16/14 1500  NA 140  K 4.2  CL 100  CO2 28  GLUCOSE 101*  BUN 22  CREATININE 1.37*  CALCIUM 9.9   Liver Function Tests:  Recent Labs Lab 01/16/14 1500  AST 19  ALT 9  ALKPHOS 94  BILITOT 0.8  PROT 7.7  ALBUMIN 4.1    Recent Labs Lab 01/16/14 1500  LIPASE 10*   No results found for this basename: AMMONIA,  in the last 168 hours CBC:  Recent Labs Lab 01/16/14 1500  WBC 6.2  NEUTROABS 4.7  HGB 11.8*  HCT 35.9*  MCV 94.7  PLT 175   Cardiac Enzymes: No results found for this basename: CKTOTAL, CKMB, CKMBINDEX, TROPONINI,  in the last 168 hours  BNP (last 3 results)  Recent Labs  05/23/13 1358 07/06/13 1622  PROBNP 8068.0* 9465.0*   CBG: No results found for this basename: GLUCAP,  in the last 168 hours  Radiological Exams on Admission: Ct Abdomen Pelvis Wo Contrast  01/16/2014   CLINICAL DATA:  Abdominal pain and constipation. Left inguinal hernia repair for weeks prior.  EXAM: CT ABDOMEN AND PELVIS WITHOUT CONTRAST  TECHNIQUE: Multidetector CT imaging of the abdomen and pelvis was performed following the standard protocol without IV contrast.  COMPARISON:  CT 11/14/2013  FINDINGS: There is a new bilateral pleural effusions greater on the right. Diffuse ground-glass opacities in the lower lobes suggesting pulmonary edema.  Hepatobiliary: No focal hepatic lesion on this noncontrast exam. Small amount of dependent sludge within the gallbladder.  Pancreas: There is fatty replaced the pancreatic head.  Spleen: Normal spleen  Adrenals/Urinary Tract: Adrenal glands kidneys are normal. There is a low-density cyst extending from the left kidney. No ureterolithiasis. No bladder calculi.  Stomach/Bowel: Stomach, small bowel, cecum are normal. Contrast does not reach the ascending colon but there  is no evidence of bowel obstruction. Moderate volume of stool throughout the colon without obstructing lesion identified. Moderate volume stool in the rectum.  Vascular/Lymphatic: Abdominal aorta is normal caliber. There is no retroperitoneal or periportal lymphadenopathy. No pelvic lymphadenopathy. Atherosclerotic calcification aorta.  Reproductive: Prostate gland is normal.  No pelvic lymphadenopathy  Other: No free fluid or abscess.  Musculoskeletal: Degenerate spurring of the spine.  IMPRESSION: 1. New bilateral pleural effusions and lower lobe pulmonary edema. Evaluate for congestive heart failure. 2. Small volume sludge is  the gallbladder without evidence cholecystitis. 3. No evidence bowel obstruction. 4. Moderate volume of stool throughout the colon.   Electronically Signed   By: Suzy Bouchard M.D.   On: 01/16/2014 18:34   Dg Abd Acute W/chest  01/16/2014   CLINICAL DATA:  Hernia repair in September, recent prompt to constipation, requiring enema to have bowel movement, shortness of breath at times, generalized weakness, personal history of smoking, hypertension, follicular lymphoma post chemotherapy, atrial fibrillation, non ischemic cardiomyopathy  EXAM: ACUTE ABDOMEN SERIES (ABDOMEN 2 VIEW & CHEST 1 VIEW)  COMPARISON:  Chest radiograph 08/06/2013, abdominal and pelvic CT 11/14/2013  FINDINGS: Enlargement of cardiac silhouette with pulmonary vascular congestion.  Atherosclerotic calcification aorta.  No acute failure or consolidation.  No pleural effusion or pneumothorax.  Bones demineralized with note of prior RIGHT shoulder joint replacement.  Scattered gas and stool in colon.  Stool in rectum.  Air-filled mildly prominent small bowel loops in the LEFT upper quadrant, cannot exclude a degree of proximal small bowel obstruction.  No bowel wall thickening or free intraperitoneal air.  Multilevel endplate spur formation lumbar spine.  IMPRESSION: Enlargement of cardiac silhouette with pulmonary  vascular congestion.  Prominent proximal small bowel loops in LEFT upper quadrant up to 4 cm in diameter, cannot exclude a component of small bowel obstruction; these small bowel loops appear increased in size since the most recent CT.  Consider CT abdomen and pelvis with IV and oral contrast to further evaluate.   Electronically Signed   By: Lavonia Dana M.D.   On: 01/16/2014 16:16    EKG: Independently reviewed. Atrial fibrillation, rate controlled   Assessment/Plan Principal Problem:   Acute exacerbation of CHF (congestive heart failure) Active Problems:   Atrial fibrillation   Chronic anticoagulation   Hyperlipidemia   OA (osteoarthritis) of knee   Inguinal hernia unilateral, non-recurrent   CHF (congestive heart failure)   Constipation  Acute exacerbation of CHF Patient does have grade 2 diastolic dysfunction, and now coming with exacerbation of CHF after he cut down the Lasix from twice a day to once a day 2 weeks ago. Will start patient on Lasix 40 mg IV every 12 hours. Foley catheter to gravity, strict I's and O's. Will check BNP  Constipation Likely secondary to hydrocodone, will start MiraLAX everyday. Will give Dulcolax suppository 1 tonight. If no improvement consider starting patient on Senokot tablets daily at bedtime.  Atrial fibrillation Heart rate is controlled, continue Cardizem 180 mg daily, digoxin 0.125 mg daily. Continue the anticoagulation with Xarelto.  COPD Stable, continue tiotropium will also start albuterol neb lyses every 2 hours when necessary for shortness of breath.  Osteoarthritis Continue Vicodin when necessary for pain  Code status: Patient is DO NOT RESUSCITATE  Family discussion: Admission, patients condition and plan of care including tests being ordered have been discussed with the patient and *his wife at bedside* who indicate understanding and agree with the plan and Code Status.   Time Spent on Admission: 65 minutes  [facility] Pager: [phone] 01/16/2014, 8:04 PM  If 7PM-7AM, please contact night-coverage  [email or web address]  Password [email or web address]

## 2014-01-16 NOTE — ED Notes (Signed)
Pt drinking 2nd bottle of contrast.  Reports pain has improved at this time.  Says pain eases off.

## 2014-01-16 NOTE — ED Provider Notes (Signed)
CSN: [id number]     Arrival date & time 01/16/14  1340 History   This chart was scribed for Johnna Acosta, MD by Rayfield Citizen, ED Scribe. This patient was seen in room APA05/APA05 and the patient's care was started at 2:46 PM.    Chief Complaint  Patient presents with  . Abdominal Pain   The history is provided by the patient and the spouse. No language interpreter was used.    HPI Comments: Charles [name] is a 73 y.o. male who presents to the Emergency Department complaining of abdominal pain. Patient was previously on coumadin for afib; he had a hernia repair on 12/16/13 and was then placed on Xarelto. Patient believes this medication is giving him problems; he complains of abdominal pain and constipation, as well as generalized weakness. He is utilizing enemas at home with relief; he also takes Colace and Miralax on occasion. He reports that his appetite is fine (eating and drinking without issue); he is passing urine normally. He denies bloody or black, tarry stools.  The abd pain is mild, intermittent and nothing makes worse - better after BM.    He currently takes $RemoveBeforeD'10mg'CkdGeYPNnjGIUI$ /325 hydrocodone (4x a day) and Ultram.   Patient was previously diagnosed with non-Hodgkin's lymphoma. Patient also has a history of CHF; his last echo (March 2015) showed an EF of 35%.   Past Medical History  Diagnosis Date  . Essential hypertension, benign   . Atrial fibrillation 10/2008  . 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 Peptic ulcer disease   . Degenerative joint disease     Knees and shoulders  . Pneumonia 2010  . Allergic rhinitis   . Anxiety   . Drug abuse   . Tobacco abuse     50 pack years  . Mitral regurgitation     Moderate to severe May 2014  . COPD (chronic obstructive pulmonary disease)   . Hernia, inguinal, left   . Arm fracture, right 07/2013  . Hernia, inguinal, left   . Shortness of breath   . Dysrhythmia   . CHF  (congestive heart failure)   . 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 Basal cell carcinoma    Past Surgical History  Procedure Laterality Date  . Neck lesion biopsy  June 23, 9145    Follicular lymphoma  . Skin cancer excision      under left breast; variously described as melanoma and basal cell  . Rotator cuff repair      right  . Patella fracture surgery Left 1975  . Hematoma evacuation      after melanoma removal  . Portacath placement  07/12/2010  . Colonoscopy  2009    Also underwent an EGD; patient reports no significant findings  . Bone marrow aspiration  06/2012  . Bone marrow biopsy  06/2012  . Fungal infection    . Port-a-cath removal Left 01/17/2013    Procedure: REMOVAL PORT-A-CATH;  Surgeon: Scherry Ran, MD;  Location: [facility];  Service: General;  Laterality: Left;  . Inguinal hernia repair Left 12/16/2013    Procedure: HERNIA REPAIR INGUINAL ADULT;  Surgeon: Scherry Ran, MD;  Location: [facility];  Service: General;  Laterality: Left;  Marland Kitchen Mass biopsy Right 12/16/2013    Procedure: FINE NEEDLE ASPIRATION NECK MASS;  Surgeon: Scherry Ran, MD;  Location: [facility];  Service: General;  Laterality: Right;   Family History  Problem Relation Age of Onset  .  Heart attack Father   . Coronary artery disease Other    History  Substance Use Topics  . Smoking status: Former Smoker -- 1.00 packs/day for 50 years    Types: Cigarettes    Quit date: 04/12/2012  . Smokeless tobacco: Never Used  . Alcohol Use: No    Review of Systems  Constitutional: Negative for appetite change.  Gastrointestinal: Positive for abdominal pain and constipation. Negative for blood in stool.  Genitourinary: Negative for dysuria, hematuria, decreased urine volume and difficulty urinating.  All other systems reviewed and are negative.   Allergies  Metoprolol and Statins  Home Medications   Prior to Admission medications   Medication Sig Start Date End Date Taking? Authorizing Provider   ALPRAZolam Duanne Moron) 1 MG tablet Take 0.5-1 mg by mouth 3 (three) times daily. Takes one half table three times daily and one whole tablet at bedtime   Yes Historical Provider, MD  cholecalciferol (VITAMIN D) 1000 UNITS tablet Take 1,000 Units by mouth every morning.   Yes Historical Provider, MD  digoxin (LANOXIN) 0.125 MG tablet Take 0.125 mg by mouth every morning.   Yes Historical Provider, MD  diltiazem (CARDIZEM CD) 180 MG 24 hr capsule Take 1 capsule (180 mg total) by mouth daily. 06/08/13  Yes Lendon Colonel, NP  docusate sodium (COLACE) 50 MG capsule Take 50-100 mg by mouth every morning.    Yes Historical Provider, MD  furosemide (LASIX) 80 MG tablet Take 80 mg by mouth 2 (two) times daily.   Yes Historical Provider, MD  HYDROcodone-acetaminophen (NORCO) 10-325 MG per tablet Take 1 tablet by mouth every 6 (six) hours as needed for moderate pain.   Yes Historical Provider, MD  lovastatin (MEVACOR) 10 MG tablet Take 10 mg by mouth at bedtime.    Yes Historical Provider, MD  omeprazole (PRILOSEC) 20 MG capsule Take 20 mg by mouth every morning.    Yes Historical Provider, MD  polyethylene glycol (MIRALAX / GLYCOLAX) packet Take 17 g by mouth daily as needed for mild constipation or moderate constipation. Takes 3 times a week   Yes Historical Provider, MD  potassium chloride SA (K-DUR,KLOR-CON) 20 MEQ tablet Take 20 mEq by mouth 2 (two) times daily. 07/08/13  Yes Nimish C Anastasio Champion, MD  PROAIR HFA 108 (90 BASE) MCG/ACT inhaler Inhale 1 puff into the lungs every 6 (six) hours as needed. For shortness of breath 01/15/12  Yes Historical Provider, MD  rivaroxaban (XARELTO) 20 MG TABS tablet Take 1 tablet (20 mg total) by mouth daily with supper. 12/16/13  Yes Lendon Colonel, NP  sertraline (ZOLOFT) 50 MG tablet Take 50 mg by mouth at bedtime. 08/13/12  Yes Angus Ailene Ravel, MD  Simethicone (PHAZYME PO) Take 1 capsule by mouth daily as needed (for gas relief/stomach symptoms).   Yes Historical  Provider, MD  tiotropium (SPIRIVA) 18 MCG inhalation capsule Place 18 mcg into inhaler and inhale daily as needed (for shortness of breath).    Yes Historical Provider, MD  traMADol (ULTRAM) 50 MG tablet Take 50 mg by mouth every 6 (six) hours as needed.   Yes Historical Provider, MD   BP 121/85  Pulse 80  Temp(Src) 97.6 F (36.4 C) (Oral)  Resp 20  Ht $R'5\' 7"'Xb$  (1.702 m)  Wt 182 lb (82.555 kg)  BMI 28.50 kg/m2  SpO2 88% Physical Exam  Nursing note and vitals reviewed. Constitutional: He is oriented to person, place, and time. He appears well-developed and well-nourished. No distress.  HENT:  Head: Normocephalic and atraumatic.  Mouth/Throat: Oropharynx is clear and moist. No oropharyngeal exudate.  Eyes: Conjunctivae and EOM are normal. Pupils are equal, round, and reactive to light. Right eye exhibits no discharge. Left eye exhibits no discharge. No scleral icterus.  Neck: Normal range of motion. Neck supple. No JVD present. No tracheal deviation present. No thyromegaly present.  Cardiovascular: Normal rate, normal heart sounds and intact distal pulses.  An irregularly irregular rhythm present. Exam reveals no gallop and no friction rub.   No murmur heard. Pulmonary/Chest: Effort normal and breath sounds normal. No respiratory distress. He has no wheezes. He has no rales.  Abdominal: Soft. Bowel sounds are normal. He exhibits no distension and no mass. There is no tenderness.  Mild epigastric and RUQ tenderness; hernia location site is clean, dry and intact (left inguinal area); no masses or bleeding   Musculoskeletal: Normal range of motion. He exhibits no edema and no tenderness.  Lymphadenopathy:    He has no cervical adenopathy.  Neurological: He is alert and oriented to person, place, and time. Coordination normal.  Skin: Skin is warm and dry. No rash noted. No erythema.  Psychiatric: He has a normal mood and affect. His behavior is normal.    ED Course  Procedures    DIAGNOSTIC STUDIES: Oxygen Saturation is 100% on RA, normal by my interpretation.    COORDINATION OF CARE: 2:55 PM Discussed treatment plan with pt at bedside; patient will be checked regarding the generalized weakness. In addition, an abdominal x-ray will be obtained to be sure the stool is not backed up. Pt agreed to plan.   Labs Review Labs Reviewed  CBC WITH DIFFERENTIAL - Abnormal; Notable for the following:    RBC 3.79 (*)    Hemoglobin 11.8 (*)    HCT 35.9 (*)    RDW 15.8 (*)    Lymphocytes Relative 11 (*)    All other components within normal limits  COMPREHENSIVE METABOLIC PANEL - Abnormal; Notable for the following:    Glucose, Bld 101 (*)    Creatinine, Ser 1.37 (*)    GFR calc non Af Amer 49 (*)    GFR calc Af Amer 57 (*)    All other components within normal limits  LIPASE, BLOOD - Abnormal; Notable for the following:    Lipase 10 (*)    All other components within normal limits  URINALYSIS, ROUTINE W REFLEX MICROSCOPIC  PRO B NATRIURETIC PEPTIDE    Imaging Review Ct Abdomen Pelvis Wo Contrast  01/16/2014   CLINICAL DATA:  Abdominal pain and constipation. Left inguinal hernia repair for weeks prior.  EXAM: CT ABDOMEN AND PELVIS WITHOUT CONTRAST  TECHNIQUE: Multidetector CT imaging of the abdomen and pelvis was performed following the standard protocol without IV contrast.  COMPARISON:  CT 11/14/2013  FINDINGS: There is a new bilateral pleural effusions greater on the right. Diffuse ground-glass opacities in the lower lobes suggesting pulmonary edema.  Hepatobiliary: No focal hepatic lesion on this noncontrast exam. Small amount of dependent sludge within the gallbladder.  Pancreas: There is fatty replaced the pancreatic head.  Spleen: Normal spleen  Adrenals/Urinary Tract: Adrenal glands kidneys are normal. There is a low-density cyst extending from the left kidney. No ureterolithiasis. No bladder calculi.  Stomach/Bowel: Stomach, small bowel, cecum are normal.  Contrast does not reach the ascending colon but there is no evidence of bowel obstruction. Moderate volume of stool throughout the colon without obstructing lesion identified. Moderate volume stool in the rectum.  Vascular/Lymphatic: Abdominal  aorta is normal caliber. There is no retroperitoneal or periportal lymphadenopathy. No pelvic lymphadenopathy. Atherosclerotic calcification aorta.  Reproductive: Prostate gland is normal.  No pelvic lymphadenopathy  Other: No free fluid or abscess.  Musculoskeletal: Degenerate spurring of the spine.  IMPRESSION: 1. New bilateral pleural effusions and lower lobe pulmonary edema. Evaluate for congestive heart failure. 2. Small volume sludge is the gallbladder without evidence cholecystitis. 3. No evidence bowel obstruction. 4. Moderate volume of stool throughout the colon.   Electronically Signed   By: Suzy Bouchard M.D.   On: 01/16/2014 18:34   Dg Abd Acute W/chest  01/16/2014   CLINICAL DATA:  Hernia repair in September, recent prompt to constipation, requiring enema to have bowel movement, shortness of breath at times, generalized weakness, personal history of smoking, hypertension, follicular lymphoma post chemotherapy, atrial fibrillation, non ischemic cardiomyopathy  EXAM: ACUTE ABDOMEN SERIES (ABDOMEN 2 VIEW & CHEST 1 VIEW)  COMPARISON:  Chest radiograph 08/06/2013, abdominal and pelvic CT 11/14/2013  FINDINGS: Enlargement of cardiac silhouette with pulmonary vascular congestion.  Atherosclerotic calcification aorta.  No acute failure or consolidation.  No pleural effusion or pneumothorax.  Bones demineralized with note of prior RIGHT shoulder joint replacement.  Scattered gas and stool in colon.  Stool in rectum.  Air-filled mildly prominent small bowel loops in the LEFT upper quadrant, cannot exclude a degree of proximal small bowel obstruction.  No bowel wall thickening or free intraperitoneal air.  Multilevel endplate spur formation lumbar spine.  IMPRESSION:  Enlargement of cardiac silhouette with pulmonary vascular congestion.  Prominent proximal small bowel loops in LEFT upper quadrant up to 4 cm in diameter, cannot exclude a component of small bowel obstruction; these small bowel loops appear increased in size since the most recent CT.  Consider CT abdomen and pelvis with IV and oral contrast to further evaluate.   Electronically Signed   By: Lavonia Dana M.D.   On: 01/16/2014 16:16    ED ECG REPORT  I personally interpreted this EKG   Date: 01/16/2014   Rate: 79  Rhythm: atrial fibrillation  QRS Axis: normal  Intervals: normal  ST/T Wave abnormalities: nonspecific T wave changes  Conduction Disutrbances:nonspecific intraventricular conduction delay  Narrative Interpretation:   Old EKG Reviewed: c/w 08/06/13, no significant changes seen   MDM   Final diagnoses:  Abdominal pain  Slow transit constipation  Acute systolic congestive heart failure   Has ongoing abd pain which is mild - diffuse and not reproducible on exam.  His hernia has healed well and there is no n/v and normal appetite.  Check UA, labs (source of weakness). Xray to eval for constipation though suspect opiate related slowed transit.  CT scan confirms that the patient has moderate amount of constipation and stool burden, no surgical complications and pulmonary evidence of pulmonary edema as well as bilateral pleural effusions. The patient becomes hypoxic on ambulation and does not tolerate even a short amount of ambulation with his walker. He is requiring supplemental oxygen and at this time will need diuretics and admission to the hospital  Care was discussed with the hospitalist who is in agreement.   I personally performed the services described in this documentation, which was scribed in my presence. The recorded information has been reviewed and is accurate.  Meds given in ED:  Medications  furosemide (LASIX) injection 40 mg (not administered)  iohexol (OMNIPAQUE)  300 MG/ML solution 50 mL (50 mLs Oral Contrast Given 01/16/14 1637)    New Prescriptions   No  medications on file      Johnna Acosta, MD 01/16/14 1924

## 2014-01-16 NOTE — ED Notes (Signed)
abd pain for 3 days, sob Feels weak,  Hernia repair 9/25.  No NVD.  Feels constipated.

## 2014-01-16 NOTE — Patient Instructions (Signed)
Pt was started on Xarelto 20mg  daily on 12/19/13 for atrial fib by Jory Sims FNP.  Reviewed patients medication list.  Pt  currently on any combined P-gp and strong CYP3A4 inhibitors/inducers (ketoconazole, traconazole, ritonavir, carbamazepine, phenytoin, rifampin, St. John's wort).  Reviewed labs from 01/16/14:  SCr 1.37 , Weight 190 , CrCl  57.66 .  Dose is appropriate based on CrCl.   Hgb and HCT: 11.8/35.9  A full discussion of the nature of anticoagulants has been carried out.  A benefit/risk analysis has been presented to the patient, so that they understand the justification for choosing anticoagulation with Xarelto at this time.  The need for compliance is stressed.  Pt is aware to take the medication once daily with the largest meal of the day.  Side effects of potential bleeding are discussed, including unusual colored urine or stools, coughing up blood or coffee ground emesis, nose bleeds or serious fall or head trauma.  Discussed signs and symptoms of stroke. The patient should avoid any OTC items containing aspirin or ibuprofen.  Avoid alcohol consumption.   Call if any signs of abnormal bleeding.  Discussed financial obligations and resolved any difficulty in obtaining medication.  Next lab test test in 3 months.

## 2014-01-16 NOTE — ED Notes (Signed)
Pt drinking contrast. 

## 2014-01-16 NOTE — ED Notes (Signed)
Pt reports had hernia repair in sept and has been having problems with constipation recently.  Reports has to use an enema to have a bm.  Denies n/v.  C/O SOB at times and generalized weakness.

## 2014-01-17 DIAGNOSIS — I4891 Unspecified atrial fibrillation: Secondary | ICD-10-CM

## 2014-01-17 DIAGNOSIS — J449 Chronic obstructive pulmonary disease, unspecified: Secondary | ICD-10-CM

## 2014-01-17 DIAGNOSIS — I5043 Acute on chronic combined systolic (congestive) and diastolic (congestive) heart failure: Principal | ICD-10-CM

## 2014-01-17 LAB — TROPONIN I: Troponin I: <0.3 ng/mL (ref <0.30)

## 2014-01-17 LAB — COMPREHENSIVE METABOLIC PANEL
ALBUMIN: 3.9 g/dL (ref 3.5–5.2)
ALK PHOS: 92 U/L (ref 39–117)
ALT: 9 U/L (ref 0–53)
ANION GAP: 14 (ref 5–15)
AST: 17 U/L (ref 0–37)
BUN: 20 mg/dL (ref 6–23)
CO2: 27 mEq/L (ref 19–32)
CREATININE: 1.2 mg/dL (ref 0.50–1.35)
Calcium: 9.3 mg/dL (ref 8.4–10.5)
Chloride: 98 mEq/L (ref 96–112)
GFR calc Af Amer: 67 mL/min — ABNORMAL LOW (ref >90)
GFR calc non Af Amer: 58 mL/min — ABNORMAL LOW (ref >90)
Glucose, Bld: 103 mg/dL — ABNORMAL HIGH (ref 70–99)
POTASSIUM: 4 meq/L (ref 3.7–5.3)
Sodium: 139 mEq/L (ref 137–147)
Total Bilirubin: 1.1 mg/dL (ref 0.3–1.2)
Total Protein: 7.4 g/dL (ref 6.0–8.3)

## 2014-01-17 LAB — CBC
HCT: 35.9 % — ABNORMAL LOW (ref 39.0–52.0)
Hemoglobin: 11.9 g/dL — ABNORMAL LOW (ref 13.0–17.0)
MCH: 30.7 pg (ref 26.0–34.0)
MCHC: 33.1 g/dL (ref 30.0–36.0)
MCV: 92.8 fL (ref 78.0–100.0)
Platelets: 169 10*3/uL (ref 150–400)
RBC: 3.87 MIL/uL — ABNORMAL LOW (ref 4.22–5.81)
RDW: 15.7 % — AB (ref 11.5–15.5)
WBC: 6.5 10*3/uL (ref 4.0–10.5)

## 2014-01-17 MED ORDER — LORAZEPAM 0.5 MG PO TABS
0.5000 mg | ORAL_TABLET | Freq: Once | ORAL | Status: AC
  Administered 2014-01-17: 0.5 mg via ORAL
  Filled 2014-01-17: qty 1

## 2014-01-17 NOTE — Progress Notes (Signed)
[facility] PROGRESS NOTE  Charles [name]:[id number] DOB: Sep 04, 1940 DOA: 01/16/2014 PCP: [name], MD  Assessment/Plan: 1. Acute on chronic combined congestive heart failure. Ejection fraction of 35-40%. Patient had decreased his dose of Lasix that he was taking as an outpatient. He presented with shortness of breath and evidence of volume overload. He's been started on intravenous Lasix with good diuresis. Renal function appears to be improving. Continue current treatment to monitor intake and output. 2. Chronic atrial fibrillation. Heart rate is controlled on Cardizem and digoxin. He is anticoagulated with Xarelto 3. COPD. No evidence of wheezing. Continue current treatments. 4. Hyperlipidemia. Continue statin  Code Status: full code Family Communication: discussed with patient Disposition Plan: discharge home once improved   Consultants:    Procedures:    Antibiotics:    HPI/Subjective: Feeling better, breathing improving  Objective: Filed Vitals:   01/17/14 1058  BP: 131/95  Pulse: 76  Temp:   Resp:     Intake/Output Summary (Last 24 hours) at 01/17/14 1213 Last data filed at 01/17/14 1100  Gross per 24 hour  Intake      0 ml  Output   2355 ml  Net  -2355 ml   Filed Weights   01/16/14 1409  Weight: 82.555 kg (182 lb)    Exam:   General:  NAD   Cardiovascular: s1, s2, rrr  Respiratory: crackles at bases  Abdomen: soft, nt, nd, bs+  Musculoskeletal: trace edema b/l   Data Reviewed: Basic Metabolic Panel:  Recent Labs Lab 01/16/14 1500 01/17/14 0326  NA 140 139  K 4.2 4.0  CL 100 98  CO2 28 27  GLUCOSE 101* 103*  BUN 22 20  CREATININE 1.37* 1.20  CALCIUM 9.9 9.3   Liver Function Tests:  Recent Labs Lab 01/16/14 1500 01/17/14 0326  AST 19 17  ALT 9 9  ALKPHOS 94 92  BILITOT 0.8 1.1  PROT 7.7 7.4  ALBUMIN 4.1 3.9    Recent Labs Lab 01/16/14 1500  LIPASE 10*   No results found for this basename:  AMMONIA,  in the last 168 hours CBC:  Recent Labs Lab 01/16/14 1500 01/17/14 0326  WBC 6.2 6.5  NEUTROABS 4.7  --   HGB 11.8* 11.9*  HCT 35.9* 35.9*  MCV 94.7 92.8  PLT 175 169   Cardiac Enzymes:  Recent Labs Lab 01/16/14 2117 01/17/14 0304 01/17/14 0837  TROPONINI <0.30 <0.30 <0.30   BNP (last 3 results)  Recent Labs  05/23/13 1358 07/06/13 1622 01/16/14 1500  PROBNP 8068.0* 9465.0* 6849.0*   CBG: No results found for this basename: GLUCAP,  in the last 168 hours  No results found for this or any previous visit (from the past 240 hour(s)).   Studies: Ct Abdomen Pelvis Wo Contrast  01/16/2014   CLINICAL DATA:  Abdominal pain and constipation. Left inguinal hernia repair for weeks prior.  EXAM: CT ABDOMEN AND PELVIS WITHOUT CONTRAST  TECHNIQUE: Multidetector CT imaging of the abdomen and pelvis was performed following the standard protocol without IV contrast.  COMPARISON:  CT 11/14/2013  FINDINGS: There is a new bilateral pleural effusions greater on the right. Diffuse ground-glass opacities in the lower lobes suggesting pulmonary edema.  Hepatobiliary: No focal hepatic lesion on this noncontrast exam. Small amount of dependent sludge within the gallbladder.  Pancreas: There is fatty replaced the pancreatic head.  Spleen: Normal spleen  Adrenals/Urinary Tract: Adrenal glands kidneys are normal. There is a low-density cyst extending from the left kidney. No ureterolithiasis.  No bladder calculi.  Stomach/Bowel: Stomach, small bowel, cecum are normal. Contrast does not reach the ascending colon but there is no evidence of bowel obstruction. Moderate volume of stool throughout the colon without obstructing lesion identified. Moderate volume stool in the rectum.  Vascular/Lymphatic: Abdominal aorta is normal caliber. There is no retroperitoneal or periportal lymphadenopathy. No pelvic lymphadenopathy. Atherosclerotic calcification aorta.  Reproductive: Prostate gland is normal.  No  pelvic lymphadenopathy  Other: No free fluid or abscess.  Musculoskeletal: Degenerate spurring of the spine.  IMPRESSION: 1. New bilateral pleural effusions and lower lobe pulmonary edema. Evaluate for congestive heart failure. 2. Small volume sludge is the gallbladder without evidence cholecystitis. 3. No evidence bowel obstruction. 4. Moderate volume of stool throughout the colon.   Electronically Signed   By: Suzy Bouchard M.D.   On: 01/16/2014 18:34   Dg Abd Acute W/chest  01/16/2014   CLINICAL DATA:  Hernia repair in September, recent prompt to constipation, requiring enema to have bowel movement, shortness of breath at times, generalized weakness, personal history of smoking, hypertension, follicular lymphoma post chemotherapy, atrial fibrillation, non ischemic cardiomyopathy  EXAM: ACUTE ABDOMEN SERIES (ABDOMEN 2 VIEW & CHEST 1 VIEW)  COMPARISON:  Chest radiograph 08/06/2013, abdominal and pelvic CT 11/14/2013  FINDINGS: Enlargement of cardiac silhouette with pulmonary vascular congestion.  Atherosclerotic calcification aorta.  No acute failure or consolidation.  No pleural effusion or pneumothorax.  Bones demineralized with note of prior RIGHT shoulder joint replacement.  Scattered gas and stool in colon.  Stool in rectum.  Air-filled mildly prominent small bowel loops in the LEFT upper quadrant, cannot exclude a degree of proximal small bowel obstruction.  No bowel wall thickening or free intraperitoneal air.  Multilevel endplate spur formation lumbar spine.  IMPRESSION: Enlargement of cardiac silhouette with pulmonary vascular congestion.  Prominent proximal small bowel loops in LEFT upper quadrant up to 4 cm in diameter, cannot exclude a component of small bowel obstruction; these small bowel loops appear increased in size since the most recent CT.  Consider CT abdomen and pelvis with IV and oral contrast to further evaluate.   Electronically Signed   By: Lavonia Dana M.D.   On: 01/16/2014 16:16     Scheduled Meds: . ALPRAZolam  1 mg Oral QHS  . digoxin  0.125 mg Oral q morning - 10a  . diltiazem  180 mg Oral Daily  . furosemide  40 mg Intravenous Q12H  . polyethylene glycol  17 g Oral Daily  . potassium chloride SA  20 mEq Oral BID  . pravastatin  10 mg Oral q1800  . rivaroxaban  20 mg Oral Q supper  . sertraline  50 mg Oral QHS  . sodium chloride  3 mL Intravenous Q12H   Continuous Infusions:   Principal Problem:   Acute exacerbation of CHF (congestive heart failure) Active Problems:   Atrial fibrillation   Chronic anticoagulation   Hyperlipidemia   OA (osteoarthritis) of knee   Inguinal hernia unilateral, non-recurrent   CHF (congestive heart failure)   Constipation    Time spent: 25mins    MEMON,JEHANZEB  [facility] Pager [phone]. If 7PM-7AM, please contact night-coverage at [email or web address], password [facility] 01/17/2014, 12:13 PM  LOS: 1 day

## 2014-01-17 NOTE — Care Management Note (Signed)
    Page 1 of 2   01/19/2014     2:25:40 PM CARE MANAGEMENT NOTE 01/19/2014  Patient:  Charles [name], Charles [name]   Account Number:  [id number]  Date Initiated:  01/17/2014  Documentation initiated by:  Vladimir Creeks  Subjective/Objective Assessment:   Admitted with CHF exacerbation. Pt is from home with spouse, and has good family support. He will be returning home at D/C.     Action/Plan:   May need home O2, and may need HH at D/C   Anticipated DC Date:  01/19/2014   Anticipated DC Plan:  [place]  CM consult      [place]   Choice offered to / List presented to:  C-1 Patient   DME arranged  [facility]      DME agency  [place] arranged  HH-1 RN  [place].   Status of service:  Completed, signed off Medicare Important Message given?  YES (If response is "NO", the following Medicare IM given date fields will be blank) Date Medicare IM given:  01/19/2014 Medicare IM given by:  Theophilus Kinds Date Additional Medicare IM given:   Additional Medicare IM given by:    Discharge Disposition:  [place]  Per UR Regulation:  Reviewed for med. necessity/level of care/duration of stay  If discussed at [place] of Stay Meetings, dates discussed:    Comments:  01/19/14 [place] RN/CM D/C home with spouse and [facility]- new on O2 01/17/14 1600 Vladimir Creeks RN/CM

## 2014-01-17 NOTE — Progress Notes (Signed)
Patient complaining of leg cramps.  Notified MD.  Will continue to monitor patient.

## 2014-01-17 NOTE — Plan of Care (Signed)
Problem: Consults Goal: Diabetes Guidelines if Diabetic/Glucose > 140 If diabetic or lab glucose is > 140 mg/dl - Initiate Diabetes/Hyperglycemia Guidelines & Document Interventions  Outcome: Not Applicable Date Met:  11/05/46 Pt. Is not a diabetic.

## 2014-01-18 ENCOUNTER — Telehealth: Payer: Self-pay | Admitting: Cardiology

## 2014-01-18 ENCOUNTER — Telehealth: Payer: Self-pay | Admitting: *Deleted

## 2014-01-18 ENCOUNTER — Telehealth (HOSPITAL_COMMUNITY): Payer: Self-pay | Admitting: Oncology

## 2014-01-18 ENCOUNTER — Inpatient Hospital Stay (HOSPITAL_COMMUNITY): Payer: Medicare Other

## 2014-01-18 LAB — MAGNESIUM: MAGNESIUM: 2.4 mg/dL (ref 1.5–2.5)

## 2014-01-18 LAB — BASIC METABOLIC PANEL
ANION GAP: 15 (ref 5–15)
BUN: 18 mg/dL (ref 6–23)
CALCIUM: 9.2 mg/dL (ref 8.4–10.5)
CO2: 27 mEq/L (ref 19–32)
CREATININE: 1.16 mg/dL (ref 0.50–1.35)
Chloride: 99 mEq/L (ref 96–112)
GFR calc Af Amer: 70 mL/min — ABNORMAL LOW (ref >90)
GFR, EST NON AFRICAN AMERICAN: 60 mL/min — AB (ref >90)
Glucose, Bld: 109 mg/dL — ABNORMAL HIGH (ref 70–99)
Potassium: 4 mEq/L (ref 3.7–5.3)
SODIUM: 141 meq/L (ref 137–147)

## 2014-01-18 MED ORDER — SIMETHICONE 80 MG PO CHEW
80.0000 mg | CHEWABLE_TABLET | Freq: Once | ORAL | Status: AC
  Administered 2014-01-18: 80 mg via ORAL
  Filled 2014-01-18: qty 1

## 2014-01-18 MED ORDER — TRAMADOL HCL 50 MG PO TABS
50.0000 mg | ORAL_TABLET | Freq: Four times a day (QID) | ORAL | Status: DC
  Administered 2014-01-18 – 2014-01-19 (×5): 50 mg via ORAL
  Filled 2014-01-18 (×5): qty 1

## 2014-01-18 MED ORDER — ALPRAZOLAM 0.25 MG PO TABS
0.2500 mg | ORAL_TABLET | Freq: Two times a day (BID) | ORAL | Status: DC | PRN
  Administered 2014-01-18 – 2014-01-19 (×3): 0.25 mg via ORAL
  Filled 2014-01-18 (×3): qty 1

## 2014-01-18 MED ORDER — DILTIAZEM HCL 60 MG PO TABS
60.0000 mg | ORAL_TABLET | Freq: Once | ORAL | Status: AC
  Administered 2014-01-18: 60 mg via ORAL
  Filled 2014-01-18: qty 1

## 2014-01-18 MED ORDER — FUROSEMIDE 40 MG PO TABS
40.0000 mg | ORAL_TABLET | Freq: Two times a day (BID) | ORAL | Status: DC
  Administered 2014-01-18 – 2014-01-19 (×2): 40 mg via ORAL
  Filled 2014-01-18 (×2): qty 1

## 2014-01-18 MED ORDER — ALUM & MAG HYDROXIDE-SIMETH 200-200-20 MG/5ML PO SUSP
15.0000 mL | Freq: Four times a day (QID) | ORAL | Status: DC | PRN
  Administered 2014-01-18: 15 mL via ORAL
  Filled 2014-01-18: qty 30

## 2014-01-18 MED ORDER — PANTOPRAZOLE SODIUM 40 MG PO TBEC
40.0000 mg | DELAYED_RELEASE_TABLET | Freq: Every day | ORAL | Status: DC
  Administered 2014-01-18 – 2014-01-19 (×2): 40 mg via ORAL
  Filled 2014-01-18 (×2): qty 1

## 2014-01-18 NOTE — Telephone Encounter (Signed)
Pt biopsy for Monday is in neck lymphoid just FYI from last phone note

## 2014-01-18 NOTE — Telephone Encounter (Signed)
Pt instructions were faxed to Tuedo's office. We called the office to get details on the biopsy for Dr. Domenic Polite.

## 2014-01-18 NOTE — Progress Notes (Signed)
Charles [name] YQI:[id number] DOB: [birth year] DOA: 01/16/2014 PCP: [name], MD  Brief narrative: 73 y/o ?, known h/o NICM ef 35-40%, A. Fib CHad2Vasc2 score=2 , Mod-severe AS, Basal Cell Ca left Chest wall s/p Excision/Plastics, prior h/o Lymphoma admitted 01/16/14 to [facility] with increasing shortness of breath, dyspnea on exertion orthopnea. Noted hypoxia with sats dropped to 84% on room air pulmonary vascular congestion.  Past medical history-As per Problem list Chart reviewed as below-  Consultants:  None  Procedures:  None  Antibiotics:  None   Subjective  Feels much better No SOB now No sputum, cp, cough cold or fever No chills nor rigors   Objective    Interim History: none  Telemetry: afib   Objective: Filed Vitals:   01/17/14 1445 01/17/14 2217 01/18/14 0530 01/18/14 0632  BP: 129/64 132/81 128/74   Pulse: 80 76 80 87  Temp: 97 F (36.1 C) 98.5 F (36.9 C) 98.1 F (36.7 C)   TempSrc: Axillary Oral Oral   Resp: 20 20 20 18   Height:      Weight:      SpO2: 98% 96% 95% 97%    Intake/Output Summary (Last 24 hours) at 01/18/14 0840 Last data filed at 01/18/14 0743  Gross per 24 hour  Intake    240 ml  Output   3600 ml  Net  -3360 ml    Exam:  General: alert pleasant oriente din nad Cardiovascular: s1 s2 irreg irreg Respiratory: L post lung fileds more coarse and less fremitus. Abdomen: soft, Nt, Nd Skin no Le edema-venous stasis changes Neuro intact  Data Reviewed: Basic Metabolic Panel:  Recent Labs Lab 01/16/14 1500 01/17/14 0326 01/18/14 0516  NA 140 139 141  K 4.2 4.0 4.0  CL 100 98 99  CO2 28 27 27   GLUCOSE 101* 103* 109*  BUN 22 20 18   CREATININE 1.37* 1.20 1.16  CALCIUM 9.9 9.3 9.2  MG  --   --  2.4   Liver Function Tests:  Recent Labs Lab 01/16/14 1500 01/17/14 0326  AST 19 17  ALT 9 9  ALKPHOS 94 92  BILITOT 0.8 1.1  PROT 7.7 7.4  ALBUMIN 4.1 3.9    Recent Labs Lab  01/16/14 1500  LIPASE 10*   No results found for this basename: AMMONIA,  in the last 168 hours CBC:  Recent Labs Lab 01/16/14 1500 01/17/14 0326  WBC 6.2 6.5  NEUTROABS 4.7  --   HGB 11.8* 11.9*  HCT 35.9* 35.9*  MCV 94.7 92.8  PLT 175 169   Cardiac Enzymes:  Recent Labs Lab 01/16/14 2117 01/17/14 0304 01/17/14 0837  TROPONINI <0.30 <0.30 <0.30   BNP: No components found with this basename: POCBNP,  CBG: No results found for this basename: GLUCAP,  in the last 168 hours  No results found for this or any previous visit (from the past 240 hour(s)).   Studies:              All Imaging reviewed and is as per above notation   Scheduled Meds: . ALPRAZolam  1 mg Oral QHS  . digoxin  0.125 mg Oral q morning - 10a  . diltiazem  180 mg Oral Daily  . furosemide  40 mg Intravenous Q12H  . polyethylene glycol  17 g Oral Daily  . potassium chloride SA  20 mEq Oral BID  . pravastatin  10 mg Oral q1800  . rivaroxaban  20 mg Oral Q supper  .  sertraline  50 mg Oral QHS  . sodium chloride  3 mL Intravenous Q12H   Continuous Infusions:    Assessment/Plan: 1. Acute hypoxic respiratory failure secondary to decompensated CHF-resolving. See below 2. Decompensated CHF-continue Lasix.  His baseline weight is 186 pounds, today he is 182. We will switch IV Lasix to by mouth Lasix 40 twice a day which is his home dose and monitor. We will get daily weights. If he completely is without any symptoms or we may be able to discharge him home. He is not a candidate for ACE inhibitor at this time as he had some renal insufficiency on admission which is slowly resolving. This should be reconsidered as an outpatient. 3. Moderate aortic stenosis-outpatient reevaluation by cardiology x-ray. He is 4. A. Fib CHad2Vasc2 score= 2, will need to continue Xarelto.  Continue Cardizem 180 daily , digoxin 0.125 daily. 5. Hyperlipidemia -continue Pravachol 10 mg daily at bedtime  6. Bipolar/insomnia continue  alprazolam 1 mg daily at bedtime, sertraline 50 daily at bedtime   Code Status: Full Family Communication: None present at the bedside  Disposition Plan: Inpatient may be able to discharge in 1 day    Verneita Griffes, MD  [facility] Pager [phone] 01/18/2014, 8:40 AM    LOS: 2 days

## 2014-01-18 NOTE — Evaluation (Signed)
Physical Therapy Evaluation Patient Details Name: Charles [name] MRN: [id number] DOB: [birth year] Today's Date: 01/18/2014   History of Present Illness  73 year old male who  has a past medical history of Essential hypertension, benign; Atrial fibrillation (10/2008); Nonischemic cardiomyopathy (02/2009); Peptic ulcer disease; Degenerative joint disease; Pneumonia (2010); Allergic rhinitis; Anxiety; Drug abuse; Tobacco abuse; Mitral regurgitation; COPD (chronic obstructive pulmonary disease); Hernia, inguinal, left; Arm fracture, right (07/2013); Hernia, inguinal, left; Shortness of breath; Dysrhythmia; CHF (congestive heart failure); 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  Patient recently underwent left inguinal herniorrhaphy and was discharged from the hospital on 12/17/2013. Patient has history of CHF and was taking Lasix 40 mg twice a day, which as per wife he cut down to once a day 2 weeks ago. Since then patient noticed that his breathing has become more labored and developed dyspnea on exertion along with orthopnea. He denies any fever, no cough. He does have history of COPD and takes start therapy and at home.  He denies chest pain, patient also had abdominal pain. X-ray of the abdomen showed prominent proximal small bowel loops in the left upper quadrant up to 4 cm in diameter with questionable of small bowel obstruction. CT abdomen and pelvis was done which did not show any bowel obstruction, but showed moderate volume of stool throughout the colon.  Patient takes Vicodin 4 times a day for the knee osteoarthritis. He also takes MiraLAX once a day along with  Enema, but has not had good bowel movement over the past few days.   Clinical Impression  Pt is a 73 year old male who presents to PT for assessment of functional mobility skills.  Pt reports of hx of Lt knee pain, with patella removal in the 1980s.  Pt reports increasing pain in the Lt knee, and decreasing ability to WB on  that side requiring use of RW.  During evaluation, pt was mod (I) with bed mobility skills, transfers, and supervision and use of RW for gait in room.  Noted step to gait pattern leading with the Lt LE secondary to knee pain.  Pt reports this is his typical gait speed and gait type.  Pt reports he is going for orthopedic consult in November to assess for possible knee replacement; educated pt on possibility of OPPT services to address Lt LE for prehab prior to surgery if MD orders after consult.  Pt to be discharged from acute PT services as pt is at baseline level of function.  Pt would benefit from use of rollator for gait secondary to pain in the Lt knee and limited ability to WB through this LE; rollator would allow pt increased amb distance, with seated rest breaks as able.     Follow Up Recommendations No PT follow up (Pt at baseline level of function.  Pt educated on OPPT services, as pt is considering knee replacement on Lt knee (awaiting orthopedic visit in November) and possiblity of prehab for Lt LE if MD orders after consult)    Equipment Recommendations   (Rollator)       Precautions / Restrictions Precautions Precautions: Fall Precaution Comments: Pt wears soft brace on Lt knee Restrictions Weight Bearing Restrictions: No      Mobility  Bed Mobility Overal bed mobility: Modified Independent                Transfers Overall transfer level: Modified independent Equipment used: Rolling walker (2 wheeled)  Ambulation/Gait Ambulation/Gait assistance: Supervision Ambulation Distance (Feet): 20 Feet Assistive device: Rolling walker (2 wheeled) Gait Pattern/deviations: Step-to pattern   Gait velocity interpretation: Below normal speed for age/gender General Gait Details: Step to pattern leading with Lt LE.  Noted decreased WB on the Lt LE secondary to hx of Lt knee pain.       Balance Overall balance assessment: No apparent balance deficits (not  formally assessed)                                           Pertinent Vitals/Pain Pain Assessment: 0-10 Pain Score: 7  Pain Location: Lt knee Pain Descriptors / Indicators: Aching Pain Intervention(s): Limited activity within patient's tolerance;Repositioned;Premedicated before session    Home Living Family/patient expects to be discharged to:: Private residence Living Arrangements: Spouse/significant other Available Help at Discharge: Family;Available 24 hours/day (Wife works 3 nights a week) Type of Home: House Home Access: Stairs to enter Entrance Stairs-Rails: Left Entrance Stairs-Number of Steps: 3 to back door with rail on Lt, 2 to front door without rail Home Layout: One level Home Equipment: Environmental consultant - 2 wheels;Wheelchair - manual;Crutches;Cane - single point;Grab bars - tub/shower;Grab bars - toilet;Shower seat Additional Comments: Risk analyst    Prior Function Level of Independence: Independent with assistive device(s)   Gait / Transfers Assistance Needed: Pt is mod (I) with bed mobility skills, transfers, and amb skills with use of RW.  Pt reports he drives and uses his RW for community amb skills.            Hand Dominance   Dominant Hand: Right    Extremity/Trunk Assessment               Lower Extremity Assessment: LLE deficits/detail   LLE Deficits / Details: Lt knee pain, though pt able to move through full knee flexion in bed     Communication   Communication: No difficulties  Cognition Arousal/Alertness: Awake/alert Behavior During Therapy: WFL for tasks assessed/performed Overall Cognitive Status: Within Functional Limits for tasks assessed                       Assessment/Plan    PT Assessment Patent does not need any further PT services  PT Diagnosis Abnormality of gait   PT Problem List    PT Treatment Interventions     PT Goals (Current goals can be found in the Care Plan section) Acute Rehab PT Goals PT  Goal Formulation: All assessment and education complete, DC therapy     End of Session Equipment Utilized During Treatment: Gait belt Activity Tolerance: Patient limited by pain Patient left: in bed;with call bell/phone within reach;with bed alarm set;with family/visitor present (Wife present at beginning of evaluation)           Time: 1430-1459 PT Time Calculation (min): 29 min   Charges:   PT Evaluation $Initial PT Evaluation Tier I: 1 Procedure PT Treatments $Self Care/Home Management: 8-22 (Educated pt on rehab (OPPT services) in regards to strengthening/mobility prior to possible knee replacement, knee replacement rehab/expectations)    Lonna Cobb 01/18/2014, 3:08 PM

## 2014-01-18 NOTE — Telephone Encounter (Signed)
Needs to know instructions for patient's blood thinner prior to procedure.  He is having an incisional biopsy for right neck mass scheduled for November 10th. / tgs

## 2014-01-18 NOTE — Clinical Documentation Improvement (Signed)
Presents with Acute on Chronic Systolic and Diastolic CHF; has history of COPD.   HR greater than 90; RR in mid 20's prior to oxygen supplementation  Sats 84, 88% in room air  2L of FiO2 initiated  Sats improved from 88 to 100%  Please render an opinion on the above clinical data and treatment provided. Please document findings in next progress note and discharge summary.  Acute Respiratory Failure Acute on Chronic Respiratory Failure Chronic Respiratory Failure Other Condition  Thank You, Zoila Shutter ,RN Clinical Documentation Specialist:  [facility] [employer]

## 2014-01-18 NOTE — Telephone Encounter (Signed)
Stop Xarelto 2 days prior to biopsy, restart 1 day after biopsy  KEFALAS,THOMAS 01/18/2014

## 2014-01-18 NOTE — Telephone Encounter (Signed)
Please see telephone note from 10/15 and 10/28. Patient to undergo incisional biopsy of right neck mass on 11/2 based on records. He will need to hold Xarelto 24-48 hours prior to the procedure, and likely not resume for at least 48 hours afterwards, presuming there is adequate hemostasis and no further concerns about bleeding.

## 2014-01-18 NOTE — Telephone Encounter (Signed)
Pt is currently hospitalized per [facility] at Dr. Velvet Bathe office will refax instructions. Biopsy has been rescheduled for 11/10.

## 2014-01-19 LAB — BASIC METABOLIC PANEL
Anion gap: 12 (ref 5–15)
BUN: 18 mg/dL (ref 6–23)
CO2: 28 mEq/L (ref 19–32)
Calcium: 8.9 mg/dL (ref 8.4–10.5)
Chloride: 101 mEq/L (ref 96–112)
Creatinine, Ser: 1.11 mg/dL (ref 0.50–1.35)
GFR calc Af Amer: 74 mL/min — ABNORMAL LOW (ref >90)
GFR calc non Af Amer: 63 mL/min — ABNORMAL LOW (ref >90)
Glucose, Bld: 107 mg/dL — ABNORMAL HIGH (ref 70–99)
Potassium: 3.7 mEq/L (ref 3.7–5.3)
Sodium: 141 mEq/L (ref 137–147)

## 2014-01-19 NOTE — Progress Notes (Addendum)
O2 SAT at rest 73% on room air, MD aware. Pt placed on O2 @ 3L.

## 2014-01-19 NOTE — Telephone Encounter (Signed)
Spoke with patient's wife Charles [name]. She reports biopsy has been rescheduled for Nov 10. Xarelto instructions provided as below per T.Kefalas,PA. Wife verbalized understanding.

## 2014-01-19 NOTE — Discharge Summary (Addendum)
Physician Discharge Summary  Charles [name] XBD:[id number] DOB: [birth year] DOA: 01/16/2014  PCP: [name], MD  Admit date: 01/16/2014 Discharge date: 01/19/2014  Time spent: 35 minutes  Recommendations for Outpatient Follow-up:  1. Needs a Chem-7/CBC 1 week 2.  Will need diuretic adjustment as an outpatient depending on labs in the outpatient setting 3. Will need discussion about cutting back on inhaled medications as well as BZD use in the outpatient setting x-ray  4. consider close outpatient cardiac follow-up 5. Will need reevaluation of premature physician's office regarding need for oxygen-needed oxygen on discharge 2 L at rest 3 L with ambulation  Discharge Diagnoses:  Principal Problem:   Acute on chronic combined systolic and diastolic heart failure Active Problems:   Atrial fibrillation   Chronic anticoagulation   Hyperlipidemia   OA (osteoarthritis) of knee   Inguinal hernia unilateral, non-recurrent   CHF (congestive heart failure)   Constipation   Discharge Condition: Improved  Diet recommendation: Heart healthy low-salt  Filed Weights   01/16/14 1409 01/19/14 0644  Weight: 82.555 kg (182 lb) 83.6 kg (184 lb 4.9 oz)    History of present illness:  73 y/o ?, known h/o NICM ef 35-40%, A. Fib CHad2Vasc2 score=2 , Mod-severe AS, Basal Cell Ca left Chest wall s/p Excision/Plastics, prior h/o Lymphoma admitted 01/16/14 to [facility] with increasing shortness of breath, dyspnea on exertion orthopnea. Noted hypoxia with sats dropped to 84% on room air pulmonary vascular congestion also noted on chest x-ray.   Hospital Course:   1. Acute hypoxic respiratory failure secondary to decompensated CHF-resolving. See below--on discharge his saturations were 84% and as such she qualifies ambulatory O2. 2 L at rest 3 L and ambulation. This will need to be rechecked as an issue with his primary care physician. 2. Decompensated CHF-continue Lasix. His baseline  weight is 186 pounds, today he is 182. We will switch IV Lasix to by mouth Lasix 40 twice a day.  His home dose is 80 mg twice a day which was started on discharge.. . If he completely is without any symptoms or we may be able to discharge him home. He is not a candidate for ACE inhibitor at this time as he had some renal insufficiency on admission which is slowly resolving. This should be reconsidered as an outpatient. 3. Moderate aortic stenosis-outpatient reevaluation by cardiology x-ray. He is at risk for readmission because of this as well as his underlying heart failure, but this will need to be evaluated as an outpatient 4. A. Fib CHad2Vasc2 score= 2, will need to continue Xarelto. He was relatively rate controlled during hospital stay but developed anxiety and experienced pain from not taking his usual medications on 10/29 which was trace heart rate higher. On discharge heart rate was in the low 90s and was controlled Continue Cardizem 180 daily , digoxin 0.125 daily. 5. Hyperlipidemia -continue Pravachol 10 mg daily at bedtime  6. Bipolar/insomnia continue alprazolam 1 mg daily at bedtime, sertraline 50 daily at bedtime . He seems to be dependent on the Xanax 3 times a day and may need to down titrate off of this as an outpatient    Discharge Exam: Filed Vitals:   01/19/14 0644  BP: 122/76  Pulse: 89  Temp: 98.2 F (36.8 C)  Resp: 18    General: Alert and doesn't oriented no apparent distress Cardiovascular: S1-S2 slightly tachycardic, irregularly irregular Respiratory: Clinically clear no added sounds  Discharge Instructions You were cared for by a hospitalist during  your hospital stay. If you have any questions about your discharge medications or the care you received while you were in the hospital after you are discharged, you can call the unit and asked to speak with the hospitalist on call if the hospitalist that took care of you is not available. Once you are discharged, your  primary care physician will handle any further medical issues. Please note that NO REFILLS for any discharge medications will be authorized once you are discharged, as it is imperative that you return to your primary care physician (or establish a relationship with a primary care physician if you do not have one) for your aftercare needs so that they can reassess your need for medications and monitor your lab values.   Current Discharge Medication List    CONTINUE these medications which have NOT CHANGED   Details  ALPRAZolam (XANAX) 1 MG tablet Take 0.5-1 mg by mouth 3 (three) times daily. Takes one half table three times daily and one whole tablet at bedtime    cholecalciferol (VITAMIN D) 1000 UNITS tablet Take 1,000 Units by mouth every morning.    digoxin (LANOXIN) 0.125 MG tablet Take 0.125 mg by mouth every morning.    diltiazem (CARDIZEM CD) 180 MG 24 hr capsule Take 1 capsule (180 mg total) by mouth daily. Qty: 90 capsule, Refills: 3   Associated Diagnoses: Chest pain    docusate sodium (COLACE) 50 MG capsule Take 50-100 mg by mouth every morning.     furosemide (LASIX) 80 MG tablet Take 80 mg by mouth 2 (two) times daily.    HYDROcodone-acetaminophen (NORCO) 10-325 MG per tablet Take 1 tablet by mouth every 6 (six) hours as needed for moderate pain.    lovastatin (MEVACOR) 10 MG tablet Take 10 mg by mouth at bedtime.    Associated Diagnoses: Follicular lymphoma    omeprazole (PRILOSEC) 20 MG capsule Take 20 mg by mouth every morning.     polyethylene glycol (MIRALAX / GLYCOLAX) packet Take 17 g by mouth daily as needed for mild constipation or moderate constipation. Takes 3 times a week    potassium chloride SA (K-DUR,KLOR-CON) 20 MEQ tablet Take 20 mEq by mouth 2 (two) times daily.    PROAIR HFA 108 (90 BASE) MCG/ACT inhaler Inhale 1 puff into the lungs every 6 (six) hours as needed. For shortness of breath    rivaroxaban (XARELTO) 20 MG TABS tablet Take 1 tablet (20 mg  total) by mouth daily with supper. Qty: 30 tablet, Refills: 6    sertraline (ZOLOFT) 50 MG tablet Take 50 mg by mouth at bedtime.    Simethicone (PHAZYME PO) Take 1 capsule by mouth daily as needed (for gas relief/stomach symptoms).    tiotropium (SPIRIVA) 18 MCG inhalation capsule Place 18 mcg into inhaler and inhale daily as needed (for shortness of breath).     traMADol (ULTRAM) 50 MG tablet Take 50 mg by mouth every 6 (six) hours as needed.       Allergies  Allergen Reactions  . Metoprolol Shortness Of Breath    Intolerant to all BB  . Statins     Intolerant secondary to severe myalgias    No Follow-up on file.   The results of significant diagnostics from this hospitalization (including imaging, microbiology, ancillary and laboratory) are listed below for reference.    Significant Diagnostic Studies: Ct Abdomen Pelvis Wo Contrast  01/16/2014   CLINICAL DATA:  Abdominal pain and constipation. Left inguinal hernia repair for weeks prior.  EXAM: CT ABDOMEN AND PELVIS WITHOUT CONTRAST  TECHNIQUE: Multidetector CT imaging of the abdomen and pelvis was performed following the standard protocol without IV contrast.  COMPARISON:  CT 11/14/2013  FINDINGS: There is a new bilateral pleural effusions greater on the right. Diffuse ground-glass opacities in the lower lobes suggesting pulmonary edema.  Hepatobiliary: No focal hepatic lesion on this noncontrast exam. Small amount of dependent sludge within the gallbladder.  Pancreas: There is fatty replaced the pancreatic head.  Spleen: Normal spleen  Adrenals/Urinary Tract: Adrenal glands kidneys are normal. There is a low-density cyst extending from the left kidney. No ureterolithiasis. No bladder calculi.  Stomach/Bowel: Stomach, small bowel, cecum are normal. Contrast does not reach the ascending colon but there is no evidence of bowel obstruction. Moderate volume of stool throughout the colon without obstructing lesion identified. Moderate  volume stool in the rectum.  Vascular/Lymphatic: Abdominal aorta is normal caliber. There is no retroperitoneal or periportal lymphadenopathy. No pelvic lymphadenopathy. Atherosclerotic calcification aorta.  Reproductive: Prostate gland is normal.  No pelvic lymphadenopathy  Other: No free fluid or abscess.  Musculoskeletal: Degenerate spurring of the spine.  IMPRESSION: 1. New bilateral pleural effusions and lower lobe pulmonary edema. Evaluate for congestive heart failure. 2. Small volume sludge is the gallbladder without evidence cholecystitis. 3. No evidence bowel obstruction. 4. Moderate volume of stool throughout the colon.   Electronically Signed   By: Suzy Bouchard M.D.   On: 01/16/2014 18:34   Dg Chest 2 View  01/18/2014   CLINICAL DATA:  Congestive heart failure.  EXAM: CHEST  2 VIEW  COMPARISON:  Aug 06, 2013.  FINDINGS: Stable cardiomediastinal silhouette. No pneumothorax is noted. Minimal right pleural effusion is noted. No acute pulmonary disease is noted. Status post right shoulder arthroplasty.  IMPRESSION: Minimal right pleural effusion. No other abnormality seen in the chest.   Electronically Signed   By: Sabino Dick M.D.   On: 01/18/2014 13:24   Dg Abd Acute W/chest  01/16/2014   CLINICAL DATA:  Hernia repair in September, recent prompt to constipation, requiring enema to have bowel movement, shortness of breath at times, generalized weakness, personal history of smoking, hypertension, follicular lymphoma post chemotherapy, atrial fibrillation, non ischemic cardiomyopathy  EXAM: ACUTE ABDOMEN SERIES (ABDOMEN 2 VIEW & CHEST 1 VIEW)  COMPARISON:  Chest radiograph 08/06/2013, abdominal and pelvic CT 11/14/2013  FINDINGS: Enlargement of cardiac silhouette with pulmonary vascular congestion.  Atherosclerotic calcification aorta.  No acute failure or consolidation.  No pleural effusion or pneumothorax.  Bones demineralized with note of prior RIGHT shoulder joint replacement.  Scattered gas  and stool in colon.  Stool in rectum.  Air-filled mildly prominent small bowel loops in the LEFT upper quadrant, cannot exclude a degree of proximal small bowel obstruction.  No bowel wall thickening or free intraperitoneal air.  Multilevel endplate spur formation lumbar spine.  IMPRESSION: Enlargement of cardiac silhouette with pulmonary vascular congestion.  Prominent proximal small bowel loops in LEFT upper quadrant up to 4 cm in diameter, cannot exclude a component of small bowel obstruction; these small bowel loops appear increased in size since the most recent CT.  Consider CT abdomen and pelvis with IV and oral contrast to further evaluate.   Electronically Signed   By: Lavonia Dana M.D.   On: 01/16/2014 16:16    Microbiology: No results found for this or any previous visit (from the past 240 hour(s)).   Labs: Basic Metabolic Panel:  Recent Labs Lab 01/16/14 1500 01/17/14 0326 01/18/14 0516  01/19/14 0545  NA 140 139 141 141  K 4.2 4.0 4.0 3.7  CL 100 98 99 101  CO2 28 27 27 28   GLUCOSE 101* 103* 109* 107*  BUN 22 20 18 18   CREATININE 1.37* 1.20 1.16 1.11  CALCIUM 9.9 9.3 9.2 8.9  MG  --   --  2.4  --    Liver Function Tests:  Recent Labs Lab 01/16/14 1500 01/17/14 0326  AST 19 17  ALT 9 9  ALKPHOS 94 92  BILITOT 0.8 1.1  PROT 7.7 7.4  ALBUMIN 4.1 3.9    Recent Labs Lab 01/16/14 1500  LIPASE 10*   No results found for this basename: AMMONIA,  in the last 168 hours CBC:  Recent Labs Lab 01/16/14 1500 01/17/14 0326  WBC 6.2 6.5  NEUTROABS 4.7  --   HGB 11.8* 11.9*  HCT 35.9* 35.9*  MCV 94.7 92.8  PLT 175 169   Cardiac Enzymes:  Recent Labs Lab 01/16/14 2117 01/17/14 0304 01/17/14 0837  TROPONINI <0.30 <0.30 <0.30   BNP: BNP (last 3 results)  Recent Labs  05/23/13 1358 07/06/13 1622 01/16/14 1500  PROBNP 8068.0* 9465.0* 6849.0*   CBG: No results found for this basename: GLUCAP,  in the last 168 hours     Signed:  Nita Sells  [facility] 01/19/2014, 10:25 AM

## 2014-01-19 NOTE — Progress Notes (Signed)
1500 Pt d/c home with wife present to transport him home. D/C instructions and Heart Failure education given to pt and his wife, understanding of instructions confirmed with teachback. IV removed from RIGHT AC, catheter intact, no s/s of infection noted. Pt gathered all belongings and confirmed he had all his belongings to go home that he brought to the hospital with him. Mobile O2 tanks from [place] taken with Pt. Pt left facility via w/c on O2 3L via nasal cannula. Pt able to stand and ambulate to get into car w/o difficulty.

## 2014-01-19 NOTE — Progress Notes (Signed)
Pt meets criteria for home O2. Pt will be discharged home on O2 at 3 liters [place] continuous.

## 2014-01-26 ENCOUNTER — Encounter (HOSPITAL_BASED_OUTPATIENT_CLINIC_OR_DEPARTMENT_OTHER): Payer: Self-pay | Admitting: *Deleted

## 2014-01-26 NOTE — Progress Notes (Signed)
Reviewed notes with dr Dian Situ

## 2014-01-26 NOTE — Progress Notes (Signed)
Pt was in hospital 10/15-labs and ekg done-is on oxygen now, wife says he is doing better-coming for neck node bx-may be reoccurance lymphoma

## 2014-01-31 ENCOUNTER — Encounter (HOSPITAL_BASED_OUTPATIENT_CLINIC_OR_DEPARTMENT_OTHER)
Admission: RE | Disposition: A | Discharge status: Home / Self Care | Payer: Self-pay | Source: Ambulatory Visit | Attending: Otolaryngology

## 2014-01-31 ENCOUNTER — Ambulatory Visit (HOSPITAL_BASED_OUTPATIENT_CLINIC_OR_DEPARTMENT_OTHER)
Admission: RE | Admit: 2014-01-31 | Discharge: 2014-01-31 | Disposition: A | Discharge status: Home / Self Care | Payer: Medicare Other | Source: Ambulatory Visit | Attending: Otolaryngology | Admitting: Otolaryngology

## 2014-01-31 ENCOUNTER — Ambulatory Visit (HOSPITAL_BASED_OUTPATIENT_CLINIC_OR_DEPARTMENT_OTHER): Payer: Medicare Other | Admitting: Anesthesiology

## 2014-01-31 ENCOUNTER — Encounter (HOSPITAL_BASED_OUTPATIENT_CLINIC_OR_DEPARTMENT_OTHER): Payer: Self-pay | Admitting: *Deleted

## 2014-01-31 DIAGNOSIS — I340 Nonrheumatic mitral (valve) insufficiency: Secondary | ICD-10-CM | POA: Insufficient documentation

## 2014-01-31 DIAGNOSIS — C8331 Diffuse large B-cell lymphoma, lymph nodes of head, face, and neck: Secondary | ICD-10-CM | POA: Insufficient documentation

## 2014-01-31 DIAGNOSIS — Z720 Tobacco use: Secondary | ICD-10-CM | POA: Diagnosis not present

## 2014-01-31 DIAGNOSIS — C8590 Non-Hodgkin lymphoma, unspecified, unspecified site: Secondary | ICD-10-CM | POA: Diagnosis present

## 2014-01-31 DIAGNOSIS — E785 Hyperlipidemia, unspecified: Secondary | ICD-10-CM | POA: Insufficient documentation

## 2014-01-31 DIAGNOSIS — M1710 Unilateral primary osteoarthritis, unspecified knee: Secondary | ICD-10-CM | POA: Insufficient documentation

## 2014-01-31 DIAGNOSIS — I10 Essential (primary) hypertension: Secondary | ICD-10-CM | POA: Diagnosis not present

## 2014-01-31 DIAGNOSIS — Z7901 Long term (current) use of anticoagulants: Secondary | ICD-10-CM | POA: Diagnosis not present

## 2014-01-31 DIAGNOSIS — Z9221 Personal history of antineoplastic chemotherapy: Secondary | ICD-10-CM | POA: Insufficient documentation

## 2014-01-31 DIAGNOSIS — I4891 Unspecified atrial fibrillation: Secondary | ICD-10-CM | POA: Diagnosis not present

## 2014-01-31 DIAGNOSIS — I429 Cardiomyopathy, unspecified: Secondary | ICD-10-CM | POA: Diagnosis not present

## 2014-01-31 DIAGNOSIS — I5043 Acute on chronic combined systolic (congestive) and diastolic (congestive) heart failure: Secondary | ICD-10-CM | POA: Diagnosis not present

## 2014-01-31 HISTORY — PX: MASS BIOPSY: SHX5445

## 2014-01-31 LAB — POCT I-STAT, CHEM 8
BUN: 22 mg/dL (ref 6–23)
CALCIUM ION: 1.08 mmol/L — AB (ref 1.13–1.30)
CHLORIDE: 94 meq/L — AB (ref 96–112)
Creatinine, Ser: 1.2 mg/dL (ref 0.50–1.35)
Glucose, Bld: 116 mg/dL — ABNORMAL HIGH (ref 70–99)
HCT: 34 % — ABNORMAL LOW (ref 39.0–52.0)
Hemoglobin: 11.6 g/dL — ABNORMAL LOW (ref 13.0–17.0)
Potassium: 3.7 mEq/L (ref 3.7–5.3)
SODIUM: 139 meq/L (ref 137–147)
TCO2: 30 mmol/L (ref 0–100)

## 2014-01-31 SURGERY — BIOPSY, MASS, NECK
Anesthesia: Monitor Anesthesia Care | Site: Neck | Laterality: Right

## 2014-01-31 MED ORDER — OXYCODONE HCL 5 MG PO TABS
ORAL_TABLET | ORAL | Status: AC
  Filled 2014-01-31: qty 1

## 2014-01-31 MED ORDER — CEFAZOLIN SODIUM-DEXTROSE 2-3 GM-% IV SOLR
INTRAVENOUS | Status: DC | PRN
  Administered 2014-01-31: 2 g via INTRAVENOUS

## 2014-01-31 MED ORDER — AMOXICILLIN 875 MG PO TABS: 875.0000 mg | ORAL_TABLET | Freq: Two times a day (BID) | ORAL | Status: DC

## 2014-01-31 MED ORDER — PROMETHAZINE HCL 25 MG/ML IJ SOLN: 6.2500 mg | INTRAMUSCULAR | Status: DC | PRN

## 2014-01-31 MED ORDER — BACITRACIN ZINC 500 UNIT/GM EX OINT
TOPICAL_OINTMENT | CUTANEOUS | Status: AC
  Filled 2014-01-31: qty 28.35

## 2014-01-31 MED ORDER — LIDOCAINE HCL (CARDIAC) 20 MG/ML IV SOLN
INTRAVENOUS | Status: DC | PRN
  Administered 2014-01-31: 50 mg via INTRAVENOUS

## 2014-01-31 MED ORDER — FENTANYL CITRATE 0.05 MG/ML IJ SOLN: 25.0000 ug | INTRAMUSCULAR | Status: DC | PRN

## 2014-01-31 MED ORDER — LACTATED RINGERS IV SOLN
INTRAVENOUS | Status: DC
  Administered 2014-01-31: 09:00:00 via INTRAVENOUS

## 2014-01-31 MED ORDER — FENTANYL CITRATE 0.05 MG/ML IJ SOLN
INTRAMUSCULAR | Status: DC | PRN
  Administered 2014-01-31: 25 ug via INTRAVENOUS
  Administered 2014-01-31: 50 ug via INTRAVENOUS

## 2014-01-31 MED ORDER — PROPOFOL INFUSION 10 MG/ML OPTIME
INTRAVENOUS | Status: DC | PRN
  Administered 2014-01-31: 50 ug/kg/min via INTRAVENOUS

## 2014-01-31 MED ORDER — MIDAZOLAM HCL 2 MG/2ML IJ SOLN: 1.0000 mg | INTRAMUSCULAR | Status: DC | PRN

## 2014-01-31 MED ORDER — LIDOCAINE-EPINEPHRINE 1 %-1:100000 IJ SOLN
INTRAMUSCULAR | Status: AC
  Filled 2014-01-31: qty 1

## 2014-01-31 MED ORDER — FENTANYL CITRATE 0.05 MG/ML IJ SOLN: 50.0000 ug | INTRAMUSCULAR | Status: DC | PRN

## 2014-01-31 MED ORDER — OXYCODONE HCL 5 MG PO TABS
5.0000 mg | ORAL_TABLET | Freq: Once | ORAL | Status: AC | PRN
  Administered 2014-01-31: 5 mg via ORAL

## 2014-01-31 MED ORDER — LIDOCAINE-EPINEPHRINE 1 %-1:100000 IJ SOLN
INTRAMUSCULAR | Status: DC | PRN
  Administered 2014-01-31: 8 mL

## 2014-01-31 MED ORDER — ONDANSETRON HCL 4 MG/2ML IJ SOLN
INTRAMUSCULAR | Status: DC | PRN
  Administered 2014-01-31: 4 mg via INTRAVENOUS

## 2014-01-31 MED ORDER — FENTANYL CITRATE 0.05 MG/ML IJ SOLN
INTRAMUSCULAR | Status: AC
  Filled 2014-01-31: qty 4

## 2014-01-31 MED ORDER — OXYCODONE HCL 5 MG/5ML PO SOLN: 5.0000 mg | Freq: Once | ORAL | Status: AC | PRN

## 2014-01-31 MED ORDER — OXYCODONE-ACETAMINOPHEN 5-325 MG PO TABS: 1.0000 | ORAL_TABLET | ORAL | Status: DC | PRN

## 2014-01-31 SURGICAL SUPPLY — 71 items
BENZOIN TINCTURE PRP APPL 2/3 (GAUZE/BANDAGES/DRESSINGS) IMPLANT
BLADE SURG 15 STRL LF DISP TIS (BLADE) ×1 IMPLANT
BLADE SURG 15 STRL SS (BLADE) ×2
CANISTER SUCT 1200ML W/VALVE (MISCELLANEOUS) IMPLANT
CLEANER CAUTERY TIP 5X5 PAD (MISCELLANEOUS) IMPLANT
CLIP TI MEDIUM 6 (CLIP) IMPLANT
CLIP TI WIDE RED SMALL 6 (CLIP) IMPLANT
CLOSURE WOUND 1/4X4 (GAUZE/BANDAGES/DRESSINGS)
CORDS BIPOLAR (ELECTRODE) IMPLANT
COVER BACK TABLE 60X90IN (DRAPES) ×3 IMPLANT
COVER MAYO STAND STRL (DRAPES) ×3 IMPLANT
DECANTER SPIKE VIAL GLASS SM (MISCELLANEOUS) IMPLANT
DRAIN JACKSON RD 7FR 3/32 (WOUND CARE) IMPLANT
DRAIN PENROSE 1/4X12 LTX STRL (WOUND CARE) IMPLANT
DRAIN TLS ROUND 10FR (DRAIN) IMPLANT
DRAPE U-SHAPE 76X120 STRL (DRAPES) ×3 IMPLANT
ELECT COATED BLADE 2.86 ST (ELECTRODE) ×3 IMPLANT
ELECT NEEDLE BLADE 2-5/6 (NEEDLE) IMPLANT
ELECT PAIRED SUBDERMAL (MISCELLANEOUS)
ELECT REM PT RETURN 9FT ADLT (ELECTROSURGICAL) ×3
ELECTRODE PAIRED SUBDERMAL (MISCELLANEOUS) IMPLANT
ELECTRODE REM PT RTRN 9FT ADLT (ELECTROSURGICAL) ×1 IMPLANT
EVACUATOR SILICONE 100CC (DRAIN) IMPLANT
GAUZE SPONGE 4X4 16PLY XRAY LF (GAUZE/BANDAGES/DRESSINGS) IMPLANT
GLOVE BIO SURGEON STRL SZ7.5 (GLOVE) ×3 IMPLANT
GLOVE BIOGEL M 7.0 STRL (GLOVE) ×3 IMPLANT
GLOVE BIOGEL PI IND STRL 7.5 (GLOVE) ×1 IMPLANT
GLOVE BIOGEL PI INDICATOR 7.5 (GLOVE) ×2
GLOVE SURG SS PI 7.0 STRL IVOR (GLOVE) ×3 IMPLANT
GOWN STRL REUS W/ TWL LRG LVL3 (GOWN DISPOSABLE) ×3 IMPLANT
GOWN STRL REUS W/TWL LRG LVL3 (GOWN DISPOSABLE) ×6
HEMOSTAT SURGICEL .5X2 ABSORB (HEMOSTASIS) ×3 IMPLANT
LIQUID BAND (GAUZE/BANDAGES/DRESSINGS) ×3 IMPLANT
LOCATOR NERVE 3 VOLT (DISPOSABLE) IMPLANT
MARKER SKIN DUAL TIP RULER LAB (MISCELLANEOUS) ×3 IMPLANT
NDL SAFETY ECLIPSE 18X1.5 (NEEDLE) ×1 IMPLANT
NEEDLE 27GAX1X1/2 (NEEDLE) IMPLANT
NEEDLE HYPO 18GX1.5 SHARP (NEEDLE) ×2
NEEDLE HYPO 25X1 1.5 SAFETY (NEEDLE) ×6 IMPLANT
NS IRRIG 1000ML POUR BTL (IV SOLUTION) ×3 IMPLANT
PACK BASIN DAY SURGERY FS (CUSTOM PROCEDURE TRAY) ×3 IMPLANT
PAD CLEANER CAUTERY TIP 5X5 (MISCELLANEOUS)
PENCIL BUTTON HOLSTER BLD 10FT (ELECTRODE) ×3 IMPLANT
PIN SAFETY STERILE (MISCELLANEOUS) IMPLANT
PROBE NERVBE PRASS .33 (MISCELLANEOUS) IMPLANT
SLEEVE SCD COMPRESS KNEE MED (MISCELLANEOUS) IMPLANT
SPONGE GAUZE 2X2 8PLY STER LF (GAUZE/BANDAGES/DRESSINGS)
SPONGE GAUZE 2X2 8PLY STRL LF (GAUZE/BANDAGES/DRESSINGS) IMPLANT
SPONGE GAUZE 4X4 12PLY STER LF (GAUZE/BANDAGES/DRESSINGS) IMPLANT
STAPLER VISISTAT 35W (STAPLE) IMPLANT
STRIP CLOSURE SKIN 1/4X4 (GAUZE/BANDAGES/DRESSINGS) IMPLANT
SUCTION FRAZIER TIP 10 FR DISP (SUCTIONS) IMPLANT
SUT ETHILON 3 0 PS 1 (SUTURE) IMPLANT
SUT ETHILON 4 0 PS 2 18 (SUTURE) IMPLANT
SUT PROLENE 5 0 P 3 (SUTURE) IMPLANT
SUT SILK 3 0 SH 30 (SUTURE) ×3 IMPLANT
SUT SILK 3 0 TIES 17X18 (SUTURE)
SUT SILK 3-0 18XBRD TIE BLK (SUTURE) IMPLANT
SUT SILK 4 0 TIES 17X18 (SUTURE) IMPLANT
SUT VIC AB 3-0 FS2 27 (SUTURE) IMPLANT
SUT VIC AB 4-0 P-3 18XBRD (SUTURE) IMPLANT
SUT VIC AB 4-0 P3 18 (SUTURE)
SUT VIC AB 4-0 RB1 27 (SUTURE)
SUT VIC AB 4-0 RB1 27X BRD (SUTURE) IMPLANT
SUT VICRYL 4-0 PS2 18IN ABS (SUTURE) ×3 IMPLANT
SYR BULB 3OZ (MISCELLANEOUS) ×3 IMPLANT
SYR CONTROL 10ML LL (SYRINGE) ×6 IMPLANT
TOWEL OR 17X24 6PK STRL BLUE (TOWEL DISPOSABLE) ×3 IMPLANT
TRAY DSU PREP LF (CUSTOM PROCEDURE TRAY) ×3 IMPLANT
TUBE CONNECTING 20'X1/4 (TUBING) ×1
TUBE CONNECTING 20X1/4 (TUBING) ×2 IMPLANT

## 2014-01-31 NOTE — Op Note (Signed)
NAME:  Charles [name], Charles [name] NO.:  [id number]  MEDICAL RECORD NO.:  [id number]  LOCATION:                                 FACILITY:  PHYSICIAN:  Leta Baptist, MD            DATE OF BIRTH:  Apr 23, 1940  DATE OF PROCEDURE:  01/31/2014 DATE OF DISCHARGE:  01/31/2014                              OPERATIVE REPORT   SURGEON:  Leta Baptist, MD  PREOPERATIVE DIAGNOSES: 1. Right neck mass. 2. History of follicular lymphoma.  POSTOPERATIVE DIAGNOSES: 1. Right neck mass. 2. History of follicular lymphoma.  PROCEDURE PERFORMED:  Open biopsy of deep right neck mass.  ANESTHESIA:  Local anesthesia with IV sedation.  COMPLICATIONS:  None.  ESTIMATED BLOOD LOSS:  Minimal.  INDICATION FOR PROCEDURE:  The patient is a 73 year old male with a history of follicular lymphoma.  He was previously treated with systemic chemotherapy in 2014.  The patient was doing well until June of this year, when he noted increasing right neck swelling.  His CT scan showed multiple enlarged right level 2, 3, and 4 lymph nodes.  The appearance was suspicious for recurrent lymphoma.  The patient underwent a needle aspiration biopsy at the [facility].  However, the results were nondiagnostic.  Based on the above findings, the decision was made for the patient to undergo open biopsy of his deep right neck mass.  The risks, benefits, alternatives, and details of the procedure were discussed with the patient.  Questions were invited and answered. Informed consent was obtained.  DESCRIPTION:  The patient was taken to the operating room and placed supine on the operating table.  IV sedation was administered by the anesthesiologist.  A 1% lidocaine with 1:100,000 epinephrine was infiltrated around the right level 2 neck.  Transverse incision was made over the right level 2 neck.  The incision was carried down past the level of the platysma muscles.  The right external jugular vein was ligated.   Careful dissection was then performed to separate the large right neck mass from the surrounding soft tissue and the underlying internal jugular vein and carotid artery. Next, 3 large pieces of the matted lymph nodes were then removed and sent to the Pathology Department as fresh specimens.  It should be noted that one of the lymph nodes were severely necrotic.  The surgical site was copiously irrigated.  Hemostasis was achieved with electrocautery device.  The incision was closed in layers with 4-0 Vicryl and Dermabond.  The care of the patient was turned over to the anesthesiologist.  The patient was transferred to the recovery room in good condition.  OPERATIVE FINDINGS:  The patient was noted to have multiple large matted lymph nodes within the right level 2, 3, and 4 neck.  Three large specimens were obtained.  The specimen was sent to the Pathology Department as fresh specimens.  SPECIMEN:  Right neck mass.  FOLLOWUP CARE:  The patient will be discharged home once he is awake and alert.  He will follow up in my office in 1 week.     Leta Baptist, MD     ST/MEDQ  D:  01/31/2014  T:  01/31/2014  Job:  [id number]  cc:   [facility]

## 2014-01-31 NOTE — Anesthesia Preprocedure Evaluation (Addendum)
Anesthesia Evaluation  Patient identified by MRN, date of birth, ID band  Reviewed: Allergy & Precautions, H&P , NPO status , Patient's Chart, lab work & pertinent test results  History of Anesthesia Complications Negative for: history of anesthetic complications  Airway Mallampati: II       Dental  (+) Teeth Intact   Pulmonary shortness of breath and Long-Term Oxygen Therapy, COPDformer smoker,  breath sounds clear to auscultation        Cardiovascular hypertension, + CAD and +CHF + dysrhythmias Atrial Fibrillation Rhythm:Irregular Rate:Normal + Systolic murmurs A fib, MR , mildAS   Neuro/Psych    GI/Hepatic PUD,   Endo/Other    Renal/GU      Musculoskeletal  (+) Arthritis -,   Abdominal   Peds  Hematology   Anesthesia Other Findings   Reproductive/Obstetrics                           Anesthesia Physical Anesthesia Plan  ASA: III  Anesthesia Plan: MAC   Post-op Pain Management:    Induction: Intravenous  Airway Management Planned: Natural Airway and Simple Face Mask  Additional Equipment:   Intra-op Plan:   Post-operative Plan:   Informed Consent: I have reviewed the patients History and Physical, chart, labs and discussed the procedure including the risks, benefits and alternatives for the proposed anesthesia with the patient or authorized representative who has indicated his/her understanding and acceptance.     Plan Discussed with:   Anesthesia Plan Comments:         Anesthesia Quick Evaluation

## 2014-01-31 NOTE — H&P (Signed)
H&P Update  Pt's original H&P dated 01/05/14 reviewed and placed in chart (to be scanned).  I personally examined the patient today.  No change in health. Proceed with incisional biopsy of right neck mass.

## 2014-01-31 NOTE — Brief Op Note (Signed)
01/31/2014  10:01 AM  PATIENT:  Charles [name]  73 y.o. male  PRE-OPERATIVE DIAGNOSIS:  RIGHT NECK MASS  POST-OPERATIVE DIAGNOSIS:  RIGHT NECK MASS  PROCEDURE:  Procedure(s): OPEN BIOPSY OF DEEP RIGHT NECK MASS  (Right)  SURGEON:  Surgeon(s) and Role:    * Ascencion Dike, MD - Primary  PHYSICIAN ASSISTANT:   ASSISTANTS: none   ANESTHESIA:   IV sedation  EBL:  Total I/O In: 700 [I.V.:700] Out: -   BLOOD ADMINISTERED:none  DRAINS: none   LOCAL MEDICATIONS USED:  LIDOCAINE   SPECIMEN:  Source of Specimen:  Right neck mass  DISPOSITION OF SPECIMEN:  PATHOLOGY  COUNTS:  YES  TOURNIQUET:  * No tourniquets in log *  DICTATION: .Other Dictation: Dictation Number [id number]  PLAN OF CARE: Discharge to home after PACU  PATIENT DISPOSITION:  PACU - hemodynamically stable.   Delay start of Pharmacological VTE agent (>24hrs) due to surgical blood loss or risk of bleeding: not applicable

## 2014-01-31 NOTE — Transfer of Care (Signed)
Immediate Anesthesia Transfer of Care Note  Patient: Charles [name]  Procedure(s) Performed: Procedure(s): INCISIONAL BIOPSY OF RIGHT NECK MASS  (Right)  Patient Location: PACU  Anesthesia Type:MAC  Level of Consciousness: awake  Airway & Oxygen Therapy: Patient Spontanous Breathing and Patient connected to nasal cannula oxygen  Post-op Assessment: Report given to PACU RN and Post -op Vital signs reviewed and stable  Post vital signs: Reviewed and stable  Complications: No apparent anesthesia complications

## 2014-01-31 NOTE — Anesthesia Postprocedure Evaluation (Signed)
  Anesthesia Post-op Note  Patient: Charles [name]  Procedure(s) Performed: Procedure(s): INCISIONAL BIOPSY OF RIGHT NECK MASS  (Right)  Patient Location: PACU  Anesthesia Type:MAC  Level of Consciousness: awake and alert   Airway and Oxygen Therapy: Patient Spontanous Breathing  Post-op Pain: mild  Post-op Assessment: Post-op Vital signs reviewed  Post-op Vital Signs: stable  Last Vitals:  Filed Vitals:   01/31/14 1107  BP: 108/64  Pulse: 55  Temp: 37.2 C  Resp: 16    Complications: No apparent anesthesia complications

## 2014-01-31 NOTE — Discharge Instructions (Addendum)
May resume regular activity. May take shower or bath. Restart Xarelto on 02/03/14. Follow up with Dr. Benjamine Mola next week as scheduled.  Post Anesthesia Home Care Instructions  Activity: Get plenty of rest for the remainder of the day. A responsible adult should stay with you for 24 hours following the procedure.  For the next 24 hours, DO NOT: -Drive a car -Paediatric nurse -Drink alcoholic beverages -Take any medication unless instructed by your physician -Make any legal decisions or sign important papers.  Meals: Start with liquid foods such as gelatin or soup. Progress to regular foods as tolerated. Avoid greasy, spicy, heavy foods. If nausea and/or vomiting occur, drink only clear liquids until the nausea and/or vomiting subsides. Call your physician if vomiting continues.  Special Instructions/Symptoms: Your throat may feel dry or sore from the anesthesia or the breathing tube placed in your throat during surgery. If this causes discomfort, gargle with warm salt water. The discomfort should disappear within 24 hours.   Call your surgeon if you experience:   1.  Fever over 101.0. 2.  Inability to urinate. 3.  Nausea and/or vomiting. 4.  Extreme swelling or bruising at the surgical site. 5.  Continued bleeding from the incision. 6.  Increased pain, redness or drainage from the incision. 7.  Problems related to your pain medication. 8. Any change in color, movement and/or sensation 9. Any problems and/or concerns

## 2014-01-31 NOTE — Anesthesia Procedure Notes (Signed)
Procedure Name: MAC Date/Time: 01/31/2014 8:57 AM Performed by: Lieutenant Diego Pre-anesthesia Checklist: Patient identified, Timeout performed, Emergency Drugs available, Suction available and Patient being monitored Patient Re-evaluated:Patient Re-evaluated prior to inductionOxygen Delivery Method: Simple face mask Preoxygenation: Pre-oxygenation with 100% oxygen Intubation Type: IV induction Placement Confirmation: breath sounds checked- equal and bilateral and positive ETCO2 Comments: FM for case

## 2014-02-01 ENCOUNTER — Encounter (HOSPITAL_BASED_OUTPATIENT_CLINIC_OR_DEPARTMENT_OTHER): Payer: Self-pay | Admitting: Otolaryngology

## 2014-02-02 ENCOUNTER — Other Ambulatory Visit (HOSPITAL_COMMUNITY): Payer: Self-pay | Admitting: Oncology

## 2014-02-03 ENCOUNTER — Ambulatory Visit (HOSPITAL_COMMUNITY): Payer: Medicare Other

## 2014-02-08 ENCOUNTER — Encounter (HOSPITAL_COMMUNITY): Payer: Self-pay

## 2014-02-08 ENCOUNTER — Encounter (HOSPITAL_COMMUNITY): Payer: Medicare Other | Attending: Oncology

## 2014-02-08 DIAGNOSIS — J449 Chronic obstructive pulmonary disease, unspecified: Secondary | ICD-10-CM

## 2014-02-08 DIAGNOSIS — C8581 Other specified types of non-Hodgkin lymphoma, lymph nodes of head, face, and neck: Secondary | ICD-10-CM | POA: Insufficient documentation

## 2014-02-08 DIAGNOSIS — I482 Chronic atrial fibrillation, unspecified: Secondary | ICD-10-CM

## 2014-02-08 DIAGNOSIS — C8290 Follicular lymphoma, unspecified, unspecified site: Secondary | ICD-10-CM | POA: Diagnosis present

## 2014-02-08 DIAGNOSIS — I5030 Unspecified diastolic (congestive) heart failure: Secondary | ICD-10-CM

## 2014-02-08 DIAGNOSIS — I5020 Unspecified systolic (congestive) heart failure: Secondary | ICD-10-CM

## 2014-02-08 DIAGNOSIS — C8331 Diffuse large B-cell lymphoma, lymph nodes of head, face, and neck: Secondary | ICD-10-CM

## 2014-02-08 LAB — CBC WITH DIFFERENTIAL/PLATELET
Basophils Absolute: 0.1 10*3/uL (ref 0.0–0.1)
Basophils Relative: 1 % (ref 0–1)
EOS ABS: 0.2 10*3/uL (ref 0.0–0.7)
Eosinophils Relative: 4 % (ref 0–5)
HCT: 30.7 % — ABNORMAL LOW (ref 39.0–52.0)
HEMOGLOBIN: 10 g/dL — AB (ref 13.0–17.0)
LYMPHS ABS: 0.5 10*3/uL — AB (ref 0.7–4.0)
LYMPHS PCT: 8 % — AB (ref 12–46)
MCH: 30.2 pg (ref 26.0–34.0)
MCHC: 32.6 g/dL (ref 30.0–36.0)
MCV: 92.7 fL (ref 78.0–100.0)
MONOS PCT: 8 % (ref 3–12)
Monocytes Absolute: 0.5 10*3/uL (ref 0.1–1.0)
Neutro Abs: 4.8 10*3/uL (ref 1.7–7.7)
Neutrophils Relative %: 79 % — ABNORMAL HIGH (ref 43–77)
PLATELETS: 249 10*3/uL (ref 150–400)
RBC: 3.31 MIL/uL — AB (ref 4.22–5.81)
RDW: 15.5 % (ref 11.5–15.5)
WBC: 6.1 10*3/uL (ref 4.0–10.5)

## 2014-02-08 LAB — COMPREHENSIVE METABOLIC PANEL
ALT: 21 U/L (ref 0–53)
AST: 20 U/L (ref 0–37)
Albumin: 3.5 g/dL (ref 3.5–5.2)
Alkaline Phosphatase: 92 U/L (ref 39–117)
Anion gap: 11 (ref 5–15)
BILIRUBIN TOTAL: 0.6 mg/dL (ref 0.3–1.2)
BUN: 24 mg/dL — ABNORMAL HIGH (ref 6–23)
CALCIUM: 9.7 mg/dL (ref 8.4–10.5)
CHLORIDE: 97 meq/L (ref 96–112)
CO2: 33 meq/L — AB (ref 19–32)
Creatinine, Ser: 1.19 mg/dL (ref 0.50–1.35)
GFR, EST AFRICAN AMERICAN: 68 mL/min — AB (ref >90)
GFR, EST NON AFRICAN AMERICAN: 58 mL/min — AB (ref >90)
GLUCOSE: 92 mg/dL (ref 70–99)
Potassium: 4.1 mEq/L (ref 3.7–5.3)
Sodium: 141 mEq/L (ref 137–147)
Total Protein: 7.2 g/dL (ref 6.0–8.3)

## 2014-02-08 LAB — LACTATE DEHYDROGENASE: LDH: 231 U/L (ref 94–250)

## 2014-02-08 LAB — D-DIMER, QUANTITATIVE (NOT AT ARMC): D DIMER QUANT: 1.1 ug{FEU}/mL — AB (ref 0.00–0.48)

## 2014-02-08 NOTE — Progress Notes (Signed)
Charles [name] presented for labwork. Labs per MD order drawn via Peripheral Line 25 gauge needle inserted in rt ac.  Good blood return present. Procedure without incident.  Needle removed intact. Patient tolerated procedure well.

## 2014-02-08 NOTE — Patient Instructions (Addendum)
..  [facility] Discharge Instructions  RECOMMENDATIONS MADE BY THE CONSULTANT AND ANY TEST RESULTS WILL BE SENT TO YOUR REFERRING PHYSICIAN.  EXAM FINDINGS BY THE PHYSICIAN TODAY AND SIGNS OR SYMPTOMS TO REPORT TO CLINIC OR PRIMARY PHYSICIAN: Exam and findings as discussed by Dr. Barnet Glasgow. 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  You would need another port if we do treatment   INSTRUCTIONS/FOLLOW-UP:  Labs today Pet scan Then to see Dr. Whitney Muse   Thank you for choosing [facility] to provide your oncology and hematology care.  To afford each patient quality time with our providers, please arrive at least 15 minutes before your scheduled appointment time.  With your help, our goal is to use those 15 minutes to complete the necessary work-up to ensure our physicians have the information they need to help with your evaluation and healthcare recommendations.    Effective January 1st, 2014, we ask that you re-schedule your appointment with our physicians should you arrive 10 or more minutes late for your appointment.  We strive to give you quality time with our providers, and arriving late affects you and other patients whose appointments are after yours.    Again, thank you for choosing [facility].  Our hope is that these requests will decrease the amount of time that you wait before being seen by our physicians.       _____________________________________________________________  Should you have questions after your visit to [facility], please contact our office at [phone] between the hours of 8:30 a.m. and 4:30 p.m.  Voicemails left after 4:30 p.m. will not be returned until the following business day.  For prescription refill requests, have your pharmacy contact our office with your prescription refill  request.    _______________________________________________________________  We hope that we have given you very good care.  You may receive a patient satisfaction survey in the mail, please complete it and return it as soon as possible.  We value your feedback!  _______________________________________________________________  Have you asked about our STAR program?  STAR stands for Survivorship Training and Rehabilitation, and this is a nationally recognized cancer care program that focuses on survivorship and rehabilitation.  Cancer and cancer treatments may cause problems, such as, pain, making you feel tired and keeping you from doing the things that you need or want to do. Cancer rehabilitation can help. Our goal is to reduce these troubling effects and help you have the best quality of life possible.  You may receive a survey from a nurse that asks questions about your current state of health.  Based on the survey results, all eligible patients will be referred to the [facility] program for an evaluation so we can better serve you!  A frequently asked questions sheet is available upon request.

## 2014-02-08 NOTE — Progress Notes (Signed)
[facility]  OFFICE PROGRESS NOTE  [name], MD [address] 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  INTERVAL HISTORY: Charles [name] 73 y.o. male returns for followup after being treated with 6 cycles of bendamustine and Rituxan on 11/05/2010 followed by 4 maintenance Rituxan infusions stopping after the fourth infusion on 4/81/8563 due to complications with PET scan done in April 2014 showing no evidence of lymphoma. He recently was found to have a left inguinal hernia with some difficulty in adjusting the patient's INR prior to surgery. Right neck lymph nodes were felt to be enlarged and he was seen on 11/06/2013 with repeat CT scans of the neck, chest, abdomen, and pelvis performed. There was evidence of lymphadenopathy in the right neck on CT scan. Rest of his scans are normal. Attempt at fine-needle aspiration of the right neck on 12/16/2013 in association with inguinal hernia repair was nondiagnostic. Subsequently the patient underwent right neck lymph node biopsyby Dr. Benjamine Mola on 01/31/2014 revealing diffuse large B-cell lymphoma. He was hospitalized for 4 days with worsening left ventricular heart failure. He also developed a hematoma in the left knee which was drained by his orthopedist. He denies any epistaxis, melena, hematochezia, or hematuria but has had increasing abdominal discomfort. He denies any fever, night sweats,  or sore throat. He denies dysphagia or other died of dysphagia and has noticed increased size of the right neck lymph node.  MEDICAL HISTORY: Past Medical History  Diagnosis Date   Essential hypertension, benign    Atrial fibrillation 10/2008   Nonischemic cardiomyopathy 02/2009    LVEF improved from 25-30% up to 50-55%, Nonobstructive CAD   Peptic ulcer disease    Degenerative joint disease     Knees and shoulders   Pneumonia 2010   Allergic rhinitis    Anxiety    Drug abuse    Tobacco abuse     50 pack years   Mitral regurgitation     Moderate to severe May 2014   COPD (chronic obstructive pulmonary disease)    Hernia, inguinal, left    Arm fracture, right 07/2013   Hernia, inguinal, left    Shortness of breath    Dysrhythmia    CHF (congestive heart failure)    Follicular lymphoma 03/27/9700   Basal cell carcinoma     INTERIM HISTORY: has TOBACCO ABUSE; Essential hypertension, benign; Atrial fibrillation; Chronic anticoagulation; Follicular lymphoma; Hyperlipidemia; Chronic combined systolic and diastolic CHF (congestive heart failure); Nonischemic cardiomyopathy; Mitral regurgitation; OA (osteoarthritis) of knee; Encounter for therapeutic drug monitoring; Preoperative cardiovascular examination; Inguinal hernia unilateral, non-recurrent; CHF (congestive heart failure); Acute on chronic combined systolic and diastolic heart failure; Constipation; and 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     Follicular lymphoma   6/37/8588 Imaging CT soft tissue of neck- Adenopathy more notable on right neck than left.   06/07/2010 Initial Diagnosis FNA of right neck mass concerning for lymphoma.   06/24/2010 Procedure Excisional lymph  node biopsy- CD20 NHL, follicular type, grade III.   07/09/2010 Bone Marrow Biopsy Bone marrow aspiration and biopsy- rare minute atypical lymphoid aggregates, cellular marrow with complete trilineage hematopoiesis, no  blasts found on peripheral blood   07/11/2010 Imaging PET- confluent right cervical lymphadenopathy with a foci within the skeleton and dominant left iliac lesions   07/17/2010 - 11/05/2010 Chemotherapy Bendamustine + Rituxan x 6 cycles   09/12/2010 Remission PET- Interval complete metabolic response to therapy    11/20/2010 Imaging PET- No evidence of residual or recurrent hypermetabolic lymphoma.    02/17/2011 - 12/09/2011 Chemotherapy Rituxan every 90 days maintenance x 4.  D/C'd due to pseudomonas bacteremia   06/06/2011 Imaging PET- No specific features identified to suggest residual or recurrence of tumor.   01/30/2012 Adverse Reaction Pseudomonas bacteremia   06/22/2012 Imaging PET- No findings to suggest lymphomatous involvement in the neck, chest, abdomen, or pelvis.   07/06/2013 Imaging CT CAP- Stable mediastinal and hilar lymph nodes. No findings for lymphoma in the abdomen or pelvis.   11/14/2013 Imaging CT soft tissues of the neck revealed bulky right cervical lymphadenopathy with stable mediastinal and hilar disease.   12/16/2013 Surgery fine-needle aspirate right neck nondiagnostic.   01/31/2014 Surgery Biopsy right cervical lymph node by Dr.Teoh consistent with diffuse large B-cell lymphoma.    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  MEDICATIONS: has a current medication list which includes the following prescription(s): alprazolam, calcium carbonate, cholecalciferol, digoxin, diltiazem, docusate sodium, furosemide, hydrocodone-acetaminophen, lovastatin, omeprazole, potassium chloride sa, rivaroxaban, tramadol, amoxicillin, oxycodone-acetaminophen, polyethylene glycol, proair hfa, sertraline, simethicone, and tiotropium, and the following Facility-Administered Medications: sodium chloride.  SURGICAL HISTORY:  Past Surgical History  Procedure Laterality  Date   Neck lesion biopsy  June 24, 2010    Follicular lymphoma   Skin cancer excision      under left breast; variously described as melanoma and basal cell   Rotator cuff repair      right   Patella fracture surgery Left 1975   Hematoma evacuation      after melanoma removal   Portacath placement  07/12/2010   Colonoscopy  2009    Also underwent an EGD; patient reports no significant findings   Bone marrow aspiration  06/2012   Bone marrow biopsy  06/2012   Fungal infection     Port-a-cath removal Left 01/17/2013    Procedure: REMOVAL PORT-A-CATH;  Surgeon: Marlane Hatcher, MD;  Location: [facility];  Service: General;  Laterality: Left;   Inguinal hernia repair Left 12/16/2013    Procedure: HERNIA REPAIR INGUINAL ADULT;  Surgeon: Marlane Hatcher, MD;  Location: [facility];  Service: General;  Laterality: Left;   Mass biopsy Right 12/16/2013    Procedure: FINE NEEDLE ASPIRATION NECK MASS;  Surgeon: Marlane Hatcher, MD;  Location: [facility];  Service: General;  Laterality: Right;   Mass biopsy Right 01/31/2014    Procedure: INCISIONAL BIOPSY OF RIGHT NECK MASS ;  Surgeon: Darletta Moll, MD;  Location: [facility];  Service: ENT;  Laterality: Right;    FAMILY HISTORY: family history includes Coronary artery disease in his other; Heart attack in his father.  SOCIAL HISTORY:  reports that he quit smoking about 21 months ago. His smoking use included Cigarettes. He has a 50 pack-year smoking history. He has never used smokeless tobacco.  He reports that he does not drink alcohol or use illicit drugs.  REVIEW OF SYSTEMS:  Other than that discussed above is noncontributory.  PHYSICAL EXAMINATION: ECOG PERFORMANCE STATUS: 2 - Symptomatic, <50% confined to bed  Blood pressure 93/57, pulse 61, resp. rate 16, SpO2 99 %.  GENERAL:alert, short of breath at rest. SKIN: skin color, texture, turgor are normal, no rashes or significant lesions EYES: PERLA; Conjunctiva are  pink and non-injected, sclera clear SINUSES: No redness or tenderness over maxillary or ethmoid sinuses OROPHARYNX:no exudate, no erythema on lips, buccal mucosa, or tongue. NECK: right neck mass measuring 4 cm in diameter, nontender, non-fixed. CHEST: increased AP diameter with no breast masses. LYMPH:  no palpable lymphadenopathy in the axilla or groin. Right neck lymph node measuring 4 cm in size, non-fixed. LUNGS: clear to auscultation and percussion with normal breathing effort HEART:irregularly irregular with no S3. ABDOMEN:abdomen soft, non-tender and normal bowel sounds. No free fluid wave or shifting dullness. Spleen not palpable. MUSCULOSKELETAL:no cyanosis of digits and no clubbing. Range of motion normal. +3 lower extremity edema. Slight swelling and decreased range of motion of the left knee NEURO: alert & oriented x 3 with fluent speech, no focal motor/sensory deficits   LABORATORY DATA: Office Visit on 02/08/2014  Component Date Value Ref Range Status   WBC 02/08/2014 6.1  4.0 - 10.5 K/uL Final   RBC 02/08/2014 3.31* 4.22 - 5.81 MIL/uL Final   Hemoglobin 02/08/2014 10.0* 13.0 - 17.0 g/dL Final   HCT 02/08/2014 30.7* 39.0 - 52.0 % Final   MCV 02/08/2014 92.7  78.0 - 100.0 fL Final   MCH 02/08/2014 30.2  26.0 - 34.0 pg Final   MCHC 02/08/2014 32.6  30.0 - 36.0 g/dL Final   RDW 02/08/2014 15.5  11.5 - 15.5 % Final   Platelets 02/08/2014 249  150 - 400 K/uL Final   Neutrophils Relative % 02/08/2014 79* 43 - 77 % Final   Neutro Abs 02/08/2014 4.8  1.7 - 7.7 K/uL Final   Lymphocytes Relative 02/08/2014 8* 12 - 46 % Final   Lymphs Abs 02/08/2014 0.5* 0.7 - 4.0 K/uL Final   Monocytes Relative 02/08/2014 8  3 - 12 % Final   Monocytes Absolute 02/08/2014 0.5  0.1 - 1.0 K/uL Final   Eosinophils Relative 02/08/2014 4  0 - 5 % Final   Eosinophils Absolute 02/08/2014 0.2  0.0 - 0.7 K/uL Final   Basophils Relative 02/08/2014 1  0 - 1 % Final   Basophils Absolute  02/08/2014 0.1  0.0 - 0.1 K/uL Final   D-Dimer, Quant 02/08/2014 1.10* 0.00 - 0.48 ug/mL-FEU Final   Comment:        AT THE INHOUSE ESTABLISHED CUTOFF VALUE OF 0.48 ug/mL FEU, THIS ASSAY HAS BEEN DOCUMENTED IN THE LITERATURE TO HAVE A SENSITIVITY AND NEGATIVE PREDICTIVE VALUE OF AT LEAST 98 TO 99%.  THE TEST RESULT SHOULD BE CORRELATED WITH AN ASSESSMENT OF THE CLINICAL PROBABILITY OF DVT / VTE.    Sodium 02/08/2014 141  137 - 147 mEq/L Final   Potassium 02/08/2014 4.1  3.7 - 5.3 mEq/L Final   Chloride 02/08/2014 97  96 - 112 mEq/L Final   CO2 02/08/2014 33* 19 - 32 mEq/L Final   Glucose, Bld 02/08/2014 92  70 - 99 mg/dL Final   BUN 02/08/2014 24* 6 - 23 mg/dL Final   Creatinine, Ser 02/08/2014 1.19  0.50 - 1.35 mg/dL Final   Calcium 02/08/2014 9.7  8.4 - 10.5 mg/dL Final  Total Protein 02/08/2014 7.2  6.0 - 8.3 g/dL Final   Albumin 02/08/2014 3.5  3.5 - 5.2 g/dL Final   AST 02/08/2014 20  0 - 37 U/L Final   ALT 02/08/2014 21  0 - 53 U/L Final   Alkaline Phosphatase 02/08/2014 92  39 - 117 U/L Final   Total Bilirubin 02/08/2014 0.6  0.3 - 1.2 mg/dL Final   GFR calc non Af Amer 02/08/2014 58* >90 mL/min Final   GFR calc Af Amer 02/08/2014 68* >90 mL/min Final   Comment: (NOTE) The eGFR has been calculated using the CKD EPI equation. This calculation has not been validated in all clinical situations. eGFR's persistently <90 mL/min signify possible Chronic Kidney Disease.    Anion gap 02/08/2014 11  5 - 15 Final   LDH 02/08/2014 231  94 - 250 U/L Final  Admission on 01/31/2014, Discharged on 01/31/2014  Component Date Value Ref Range Status   Sodium 01/31/2014 139  137 - 147 mEq/L Final   Potassium 01/31/2014 3.7  3.7 - 5.3 mEq/L Final   Chloride 01/31/2014 94* 96 - 112 mEq/L Final   BUN 01/31/2014 22  6 - 23 mg/dL Final   Creatinine, Ser 01/31/2014 1.20  0.50 - 1.35 mg/dL Final   Glucose, Bld 01/31/2014 116* 70 - 99 mg/dL Final   Calcium, Ion  01/31/2014 1.08* 1.13 - 1.30 mmol/L Final   TCO2 01/31/2014 30  0 - 100 mmol/L Final   Hemoglobin 01/31/2014 11.6* 13.0 - 17.0 g/dL Final   HCT 01/31/2014 34.0* 39.0 - 52.0 % Final  Admission on 01/16/2014, Discharged on 01/19/2014  Component Date Value Ref Range Status   WBC 01/16/2014 6.2  4.0 - 10.5 K/uL Final   RBC 01/16/2014 3.79* 4.22 - 5.81 MIL/uL Final   Hemoglobin 01/16/2014 11.8* 13.0 - 17.0 g/dL Final   HCT 01/16/2014 35.9* 39.0 - 52.0 % Final   MCV 01/16/2014 94.7  78.0 - 100.0 fL Final   MCH 01/16/2014 31.1  26.0 - 34.0 pg Final   MCHC 01/16/2014 32.9  30.0 - 36.0 g/dL Final   RDW 01/16/2014 15.8* 11.5 - 15.5 % Final   Platelets 01/16/2014 175  150 - 400 K/uL Final   Neutrophils Relative % 01/16/2014 76  43 - 77 % Final   Neutro Abs 01/16/2014 4.7  1.7 - 7.7 K/uL Final   Lymphocytes Relative 01/16/2014 11* 12 - 46 % Final   Lymphs Abs 01/16/2014 0.7  0.7 - 4.0 K/uL Final   Monocytes Relative 01/16/2014 7  3 - 12 % Final   Monocytes Absolute 01/16/2014 0.4  0.1 - 1.0 K/uL Final   Eosinophils Relative 01/16/2014 5  0 - 5 % Final   Eosinophils Absolute 01/16/2014 0.3  0.0 - 0.7 K/uL Final   Basophils Relative 01/16/2014 1  0 - 1 % Final   Basophils Absolute 01/16/2014 0.1  0.0 - 0.1 K/uL Final   Color, Urine 01/16/2014 YELLOW  YELLOW Final   APPearance 01/16/2014 CLEAR  CLEAR Final   Specific Gravity, Urine 01/16/2014 1.015  1.005 - 1.030 Final   pH 01/16/2014 5.5  5.0 - 8.0 Final   Glucose, UA 01/16/2014 NEGATIVE  NEGATIVE mg/dL Final   Hgb urine dipstick 01/16/2014 NEGATIVE  NEGATIVE Final   Bilirubin Urine 01/16/2014 NEGATIVE  NEGATIVE Final   Ketones, ur 01/16/2014 NEGATIVE  NEGATIVE mg/dL Final   Protein, ur 01/16/2014 NEGATIVE  NEGATIVE mg/dL Final   Urobilinogen, UA 01/16/2014 0.2  0.0 - 1.0 mg/dL Final  Nitrite 01/16/2014 NEGATIVE  NEGATIVE Final   Leukocytes, UA 01/16/2014 NEGATIVE  NEGATIVE Final   MICROSCOPIC NOT DONE ON  URINES WITH NEGATIVE PROTEIN, BLOOD, LEUKOCYTES, NITRITE, OR GLUCOSE <1000 mg/dL.   Sodium 01/16/2014 140  137 - 147 mEq/L Final   Potassium 01/16/2014 4.2  3.7 - 5.3 mEq/L Final   Chloride 01/16/2014 100  96 - 112 mEq/L Final   CO2 01/16/2014 28  19 - 32 mEq/L Final   Glucose, Bld 01/16/2014 101* 70 - 99 mg/dL Final   BUN 01/16/2014 22  6 - 23 mg/dL Final   Creatinine, Ser 01/16/2014 1.37* 0.50 - 1.35 mg/dL Final   Calcium 01/16/2014 9.9  8.4 - 10.5 mg/dL Final   Total Protein 01/16/2014 7.7  6.0 - 8.3 g/dL Final   Albumin 01/16/2014 4.1  3.5 - 5.2 g/dL Final   AST 01/16/2014 19  0 - 37 U/L Final   ALT 01/16/2014 9  0 - 53 U/L Final   Alkaline Phosphatase 01/16/2014 94  39 - 117 U/L Final   Total Bilirubin 01/16/2014 0.8  0.3 - 1.2 mg/dL Final   GFR calc non Af Amer 01/16/2014 49* >90 mL/min Final   GFR calc Af Amer 01/16/2014 57* >90 mL/min Final   Comment: (NOTE)                          The eGFR has been calculated using the CKD EPI equation.                          This calculation has not been validated in all clinical situations.                          eGFR's persistently <90 mL/min signify possible Chronic Kidney                          Disease.   Anion gap 01/16/2014 12  5 - 15 Final   Lipase 01/16/2014 10* 11 - 59 U/L Final   Pro B Natriuretic peptide (BNP) 01/16/2014 6849.0* 0 - 125 pg/mL Final   WBC 01/17/2014 6.5  4.0 - 10.5 K/uL Final   RBC 01/17/2014 3.87* 4.22 - 5.81 MIL/uL Final   Hemoglobin 01/17/2014 11.9* 13.0 - 17.0 g/dL Final   HCT 01/17/2014 35.9* 39.0 - 52.0 % Final   MCV 01/17/2014 92.8  78.0 - 100.0 fL Final   MCH 01/17/2014 30.7  26.0 - 34.0 pg Final   MCHC 01/17/2014 33.1  30.0 - 36.0 g/dL Final   RDW 01/17/2014 15.7* 11.5 - 15.5 % Final   Platelets 01/17/2014 169  150 - 400 K/uL Final   Sodium 01/17/2014 139  137 - 147 mEq/L Final   Potassium 01/17/2014 4.0  3.7 - 5.3 mEq/L Final   Chloride 01/17/2014 98  96 - 112  mEq/L Final   CO2 01/17/2014 27  19 - 32 mEq/L Final   Glucose, Bld 01/17/2014 103* 70 - 99 mg/dL Final   BUN 01/17/2014 20  6 - 23 mg/dL Final   Creatinine, Ser 01/17/2014 1.20  0.50 - 1.35 mg/dL Final   Calcium 01/17/2014 9.3  8.4 - 10.5 mg/dL Final   Total Protein 01/17/2014 7.4  6.0 - 8.3 g/dL Final   Albumin 01/17/2014 3.9  3.5 - 5.2 g/dL Final   AST 01/17/2014 17  0 - 37 U/L Final  ALT 01/17/2014 9  0 - 53 U/L Final   Alkaline Phosphatase 01/17/2014 92  39 - 117 U/L Final   Total Bilirubin 01/17/2014 1.1  0.3 - 1.2 mg/dL Final   GFR calc non Af Amer 01/17/2014 58* >90 mL/min Final   GFR calc Af Amer 01/17/2014 67* >90 mL/min Final   Comment: (NOTE)                          The eGFR has been calculated using the CKD EPI equation.                          This calculation has not been validated in all clinical situations.                          eGFR's persistently <90 mL/min signify possible Chronic Kidney                          Disease.   Anion gap 01/17/2014 14  5 - 15 Final   Troponin I 01/16/2014 <0.30  <0.30 ng/mL Final   Comment:                                 Due to the release kinetics of cTnI,                          a negative result within the first hours                          of the onset of symptoms does not rule out                          myocardial infarction with certainty.                          If myocardial infarction is still suspected,                          repeat the test at appropriate intervals.   Troponin I 01/17/2014 <0.30  <0.30 ng/mL Final   Comment:                                 Due to the release kinetics of cTnI,                          a negative result within the first hours                          of the onset of symptoms does not rule out                          myocardial infarction with certainty.                          If myocardial infarction is still suspected,  repeat the  test at appropriate intervals.   Troponin I 01/17/2014 <0.30  <0.30 ng/mL Final   Comment:                                 Due to the release kinetics of cTnI,                          a negative result within the first hours                          of the onset of symptoms does not rule out                          myocardial infarction with certainty.                          If myocardial infarction is still suspected,                          repeat the test at appropriate intervals.   Sodium 01/18/2014 141  137 - 147 mEq/L Final   Potassium 01/18/2014 4.0  3.7 - 5.3 mEq/L Final   Chloride 01/18/2014 99  96 - 112 mEq/L Final   CO2 01/18/2014 27  19 - 32 mEq/L Final   Glucose, Bld 01/18/2014 109* 70 - 99 mg/dL Final   BUN 01/18/2014 18  6 - 23 mg/dL Final   Creatinine, Ser 01/18/2014 1.16  0.50 - 1.35 mg/dL Final   Calcium 01/18/2014 9.2  8.4 - 10.5 mg/dL Final   GFR calc non Af Amer 01/18/2014 60* >90 mL/min Final   GFR calc Af Amer 01/18/2014 70* >90 mL/min Final   Comment: (NOTE)                          The eGFR has been calculated using the CKD EPI equation.                          This calculation has not been validated in all clinical situations.                          eGFR's persistently <90 mL/min signify possible Chronic Kidney                          Disease.   Anion gap 01/18/2014 15  5 - 15 Final   Magnesium 01/18/2014 2.4  1.5 - 2.5 mg/dL Final   Sodium 01/19/2014 141  137 - 147 mEq/L Final   Potassium 01/19/2014 3.7  3.7 - 5.3 mEq/L Final   Chloride 01/19/2014 101  96 - 112 mEq/L Final   CO2 01/19/2014 28  19 - 32 mEq/L Final   Glucose, Bld 01/19/2014 107* 70 - 99 mg/dL Final   BUN 01/19/2014 18  6 - 23 mg/dL Final   Creatinine, Ser 01/19/2014 1.11  0.50 - 1.35 mg/dL Final   Calcium 01/19/2014 8.9  8.4 - 10.5 mg/dL Final   GFR calc non Af Amer 01/19/2014 63* >90 mL/min Final   GFR calc Af Amer 01/19/2014 74* >90 mL/min Final   Comment:  (NOTE)  The eGFR has been calculated using the CKD EPI equation.                          This calculation has not been validated in all clinical situations.                          eGFR's persistently <90 mL/min signify possible Chronic Kidney                          Disease.   Anion gap 01/19/2014 12  5 - 15 Final    PATHOLOGY:  FINAL for LANDON, BASSFORD ([id number]) Patient: [name] Collected: 01/31/2014 Client: [facility] Accession: [id number] Received: 01/31/2014 Karle Barr, MD DOB: 15-Nov-1940 Age: 70 Gender: M Reported: 02/06/2014 [address] Patient Ph: [phone] MRN #: [id number] [place], [place] [address] Visit #: [id number] Chart #: Phone:  Fax: CC: REPORT OF SURGICAL PATHOLOGY FINAL DIAGNOSIS Diagnosis Lymph node, biopsy, Right neck - DIFFUSE LARGE B-CELL LYMPHOMA. - SEE ONCOLOGY TABLE. Microscopic Comment LYMPHOMA Histologic type: Non-Hodgkin's lymphoma, diffuse large cell type. Grade (if applicable): High grade. Flow cytometry: Noncontributory. Immunohistochemical stains: LCA, CD43, CD5, CD21, CD30, BCL-2, BCL-6, CD79a, CD10, CD138, CD20, and CD3 with appropriate controls. Touch preps/imprints: Predominance of large lymphoid cells. Comments: The sections show effacement of the architecture by a monomorphic population of predominantly large lymphoid cells characterized by vesicular chromatin and prominent nucleoli associated with brisk mitosis and areas of necrosis. The involvement is primarily diffuse with lack of obvious follicular structures. To further evaluate this process, a flow cytometric analysis was performed, but was considered non-contributory possibly due to a lack of cellular viability. Immunohistochemical stains were performed and show that the large atypical lymphoid cells are positive for LCA, CD21, CD79a, BCL-6, BCL-2 with patchy positivity for CD30. No significant CD138, CD20,  or CD10 positivity is identified. The lack of CD20 expression is likely due to prior treatment. A significant follicular dendritic network is not identified with CD21. There is a minor T-cell component in the background as seen with CD3, CD43, and CD5. The overall histologic and immunophenotypic features are consistent with diffuse large B-cell lymphoma. (BNS:ds 02/02/14) Guerry Bruin MD Pathologist, Electronic Signature (Case signed 02/06/2014) Specimen Gross and Clinical Information Specimen(s) Obtained: Lymph node, biopsy, Right neck 1 of 2 FINAL for LONNEY, REVAK [phone]) Specimen Clinical Information Right neck mass (tl) Gross The specimen is received in saline and consist of three pieces of tan pink to white, focally hemorrhagic soft tissue ranging from 1.0 x 0.9 x 0.5 cm to 1.9 x 1.2 x 0.6 cm. The largest piece if bisected, and touch preparations are made. A representative portion of tissue is submitted in RPMI for possible flow cytometry. The remaining tissue is entirely submitted in three cassettes. (KL:ds 01/31/14) Stain(s) used in Diagnosis: The following stain(s) were used in diagnosing the case: CD 21, CD 3, CD45 (LCA), CD 5, CD 43, CD 138, CD 79a, CD 20, CD-10, BCl 2, BCl 6 , CD 30. The control(s) stained appropriately. Disclaimer Some of these immunohistochemical stains may have been developed and the performance characteristics determined by [facility]. Some may not have been cleared or approved by the U.S. Food and Drug Administration. The FDA has determined that such clearance or approval is not necessary. This test is used for clinical purposes. It should not be regarded as investigational  or for research. This laboratory is certified under the [place] (CLIA-88) as qualified to perform high complexity clinical laboratory testing. Report signed out from the following location(s) Technical Component  performed at [facility], [place], [place] [address]. CLIA #: [id number], Technical Component performed at [place], [place], [place] [address]. CLIA #: [id number], Interpretation performed at [place], [place],    Urinalysis    Component Value Date/Time   COLORURINE YELLOW 01/16/2014 1529   APPEARANCEUR CLEAR 01/16/2014 1529   LABSPEC 1.015 01/16/2014 1529   PHURINE 5.5 01/16/2014 1529   GLUCOSEU NEGATIVE 01/16/2014 1529   HGBUR NEGATIVE 01/16/2014 1529   BILIRUBINUR NEGATIVE 01/16/2014 1529   KETONESUR NEGATIVE 01/16/2014 1529   PROTEINUR NEGATIVE 01/16/2014 1529   UROBILINOGEN 0.2 01/16/2014 1529   NITRITE NEGATIVE 01/16/2014 1529   LEUKOCYTESUR NEGATIVE 01/16/2014 1529    RADIOGRAPHIC STUDIES:  Contrast   Status: Final result       PACS Images     Show images for CT Soft Tissue Neck W Contrast     Study Result     CLINICAL DATA: Knot on the right side of the neck. History of follicular lymphoma status post chemotherapy.  EXAM: CT OF THE NECK WITH CONTRAST  CT OF THE CHEST WITH CONTRAST  CT OF THE ABDOMEN AND PELVIS WITH CONTRAST  TECHNIQUE: Multidetector CT imaging of the neck was performed with intravenous contrast.; Multidetector CT imaging of the abdomen and pelvis was performed following the standard protocol during bolus administration of intravenous contrast.; Multidetector CT imaging of the chest was performed following the standard protocol during bolus administration of intravenous contrast.  CONTRAST: 144mL OMNIPAQUE IOHEXOL 300 MG/ML SOLN  COMPARISON: CT of the chest, abdomen and pelvis 07/06/2013. CT cervical spine 08/06/2013. CT neck 06/06/2010.  FINDINGS: CT NECK FINDINGS  Multiple enlarged right-sided lymph nodes. Specific examples include a right level IIb lymph node measuring 2.1 x 1.7 cm (image 41 of series  2), a right level IIa lymph node measuring 2.6 x 1.8 cm (image 45 of series 2), another right level IIa lymph node measuring 1.9 x 1.6 cm (image 58 of series 2), a right level III lymph node measuring 1.8 x 1.6 cm (image 82 of series 2), and a right level IV lymph node measuring 1.6 x 1.2 cm (image 87 of series 2). No left-sided cervical lymphadenopathy is identified. Evaluation of the lower cervical nodal stations and supraclavicular region on the right side is significantly limited by a large amount of beam hardening artifact from the patient's right-sided shoulder arthroplasty. The cervical spine is again remarkable for severe multilevel degenerative disc disease, most evident at C5-C6 and C6-C7, which results in a focal kyphotic deformity of the cervical spine at this level. There is also mild multilevel facet arthropathy. These findings are very similar to prior CT of the cervical spine 08/06/2013.  CT CHEST FINDINGS  Mediastinum: Heart size is moderately enlarged with severe left atrial dilatation. There is no significant pericardial fluid, thickening or pericardial calcification. There is atherosclerosis of the thoracic aorta, the great vessels of the mediastinum and the coronary arteries, including calcified atherosclerotic plaque in the left anterior descending and left circumflex coronary arteries. No pathologically enlarged mediastinal or hilar lymph nodes. Esophagus is unremarkable in appearance.  Lungs/Pleura: No suspicious appearing pulmonary nodules or masses. No acute consolidative airspace disease. No pleural effusions. A few scattered tiny clustered areas of  peribronchovascular micronodularity are noted, most evident in the lateral segment of the right middle lobe, most compatible with areas of chronic mucoid impaction within terminal bronchioles.  Musculoskeletal: Status post right shoulder arthroplasty. There are no aggressive appearing lytic or blastic  lesions noted in the visualized portions of the skeleton.  CT ABDOMEN AND PELVIS FINDINGS  Abdomen/Pelvis: The appearance of the liver, gallbladder, pancreas, spleen, bilateral adrenal glands and the right kidney is unremarkable. Well-defined low-attenuation lesions in the left kidney are noted, measuring up to 2.4 cm in diameter, compatible with simple cysts. No significant volume of ascites. No pneumoperitoneum. No pathologic distention of small bowel. No lymphadenopathy identified in the abdomen or pelvis. Large left inguinal hernia containing a portion of the proximal sigmoid colon, without evidence of incarceration or obstruction at this time. Extensive atherosclerosis throughout the abdominal and pelvic vasculature, without evidence of aneurysm or dissection. Urinary bladder is unremarkable in appearance.  Musculoskeletal: There are no aggressive appearing lytic or blastic lesions noted in the visualized portions of the skeleton.  IMPRESSION: 1. Bulky right-sided cervical lymphadenopathy, as detailed above, concerning for recurrence of disease. 2. However, compared to prior CT of the chest, abdomen and pelvis 07/06/2013, the previously noted mediastinal and hilar lymphadenopathy has resolved, as have the previously noted bilateral pleural effusions. 3. Cardiomegaly with severe left atrial dilatation redemonstrated. 4. Large left inguinal hernia containing a short segment of the sigmoid colon. This has significantly increased in size compared to the prior study. No associated bowel incarceration or obstruction at this time. 5. Extensive atherosclerosis including 2 vessel coronary artery disease. 6. Additional incidental findings, as above.   Electronically Signed  By: Trudie Reed M.D.  On: 11/14/2013       Ct Abdomen Pelvis Wo Contrast  01/16/2014   CLINICAL DATA:  Abdominal pain and constipation. Left inguinal hernia repair for weeks prior.  EXAM: CT  ABDOMEN AND PELVIS WITHOUT CONTRAST  TECHNIQUE: Multidetector CT imaging of the abdomen and pelvis was performed following the standard protocol without IV contrast.  COMPARISON:  CT 11/14/2013  FINDINGS: There is a new bilateral pleural effusions greater on the right. Diffuse ground-glass opacities in the lower lobes suggesting pulmonary edema.  Hepatobiliary: No focal hepatic lesion on this noncontrast exam. Small amount of dependent sludge within the gallbladder.  Pancreas: There is fatty replaced the pancreatic head.  Spleen: Normal spleen  Adrenals/Urinary Tract: Adrenal glands kidneys are normal. There is a low-density cyst extending from the left kidney. No ureterolithiasis. No bladder calculi.  Stomach/Bowel: Stomach, small bowel, cecum are normal. Contrast does not reach the ascending colon but there is no evidence of bowel obstruction. Moderate volume of stool throughout the colon without obstructing lesion identified. Moderate volume stool in the rectum.  Vascular/Lymphatic: Abdominal aorta is normal caliber. There is no retroperitoneal or periportal lymphadenopathy. No pelvic lymphadenopathy. Atherosclerotic calcification aorta.  Reproductive: Prostate gland is normal.  No pelvic lymphadenopathy  Other: No free fluid or abscess.  Musculoskeletal: Degenerate spurring of the spine.  IMPRESSION: 1. New bilateral pleural effusions and lower lobe pulmonary edema. Evaluate for congestive heart failure. 2. Small volume sludge is the gallbladder without evidence cholecystitis. 3. No evidence bowel obstruction. 4. Moderate volume of stool throughout the colon.   Electronically Signed   By: Genevive Bi M.D.   On: 01/16/2014 18:34   Dg Chest 2 View  01/18/2014   CLINICAL DATA:  Congestive heart failure.  EXAM: CHEST  2 VIEW  COMPARISON:  Aug 06, 2013.  FINDINGS: Stable  cardiomediastinal silhouette. No pneumothorax is noted. Minimal right pleural effusion is noted. No acute pulmonary disease is noted.  Status post right shoulder arthroplasty.  IMPRESSION: Minimal right pleural effusion. No other abnormality seen in the chest.   Electronically Signed   By: Sabino Dick M.D.   On: 01/18/2014 13:24   Dg Abd Acute W/chest  01/16/2014   CLINICAL DATA:  Hernia repair in September, recent prompt to constipation, requiring enema to have bowel movement, shortness of breath at times, generalized weakness, personal history of smoking, hypertension, follicular lymphoma post chemotherapy, atrial fibrillation, non ischemic cardiomyopathy  EXAM: ACUTE ABDOMEN SERIES (ABDOMEN 2 VIEW & CHEST 1 VIEW)  COMPARISON:  Chest radiograph 08/06/2013, abdominal and pelvic CT 11/14/2013  FINDINGS: Enlargement of cardiac silhouette with pulmonary vascular congestion.  Atherosclerotic calcification aorta.  No acute failure or consolidation.  No pleural effusion or pneumothorax.  Bones demineralized with note of prior RIGHT shoulder joint replacement.  Scattered gas and stool in colon.  Stool in rectum.  Air-filled mildly prominent small bowel loops in the LEFT upper quadrant, cannot exclude a degree of proximal small bowel obstruction.  No bowel wall thickening or free intraperitoneal air.  Multilevel endplate spur formation lumbar spine.  IMPRESSION: Enlargement of cardiac silhouette with pulmonary vascular congestion.  Prominent proximal small bowel loops in LEFT upper quadrant up to 4 cm in diameter, cannot exclude a component of small bowel obstruction; these small bowel loops appear increased in size since the most recent CT.  Consider CT abdomen and pelvis with IV and oral contrast to further evaluate.   Electronically Signed   By: Lavonia Dana M.D.   On: 01/16/2014 16:16    ASSESSMENT:  #1. 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 #2. Combined systolic and diastolic heart failure, worsening. #3. Chronic atrial  fibrillation. #4. Chronic obstructive pulmonary disease.   PLAN:  #1. PET/CT scan to restage. #2. Due to severe comorbidity, my inclination is not to recommend any further treatment. If the patient wishes to be treated, would retreat with bendamustine and Rituxan but life port would have to be reinserted in order to complete this regimen. #3. Patient was told to call the day after the PET scan done for further recommendations. #4. Follow-up in 4 weeks   All questions were answered. The patient knows to call the clinic with any problems, questions or concerns. We can certainly see the patient much sooner if necessary.   I spent 30 minutes counseling the patient face to face. The total time spent in the appointment was 40 minutes.    Doroteo Bradford, MD 02/08/2014 2:17 PM  DISCLAIMER:  This note was dictated with voice recognition software.  Similar sounding words can inadvertently be transcribed inaccurately and may not be corrected upon review.

## 2014-02-09 ENCOUNTER — Ambulatory Visit (INDEPENDENT_AMBULATORY_CARE_PROVIDER_SITE_OTHER): Payer: Medicare Other | Admitting: Otolaryngology

## 2014-02-10 LAB — BETA 2 MICROGLOBULIN, SERUM: Beta-2 Microglobulin: 4.85 mg/L — ABNORMAL HIGH (ref <=2.51)

## 2014-02-21 ENCOUNTER — Encounter (HOSPITAL_COMMUNITY): Payer: Self-pay

## 2014-02-21 ENCOUNTER — Ambulatory Visit (HOSPITAL_COMMUNITY)
Admission: RE | Admit: 2014-02-21 | Discharge: 2014-02-21 | Disposition: A | Discharge status: Home / Self Care | Payer: Medicare Other | Source: Ambulatory Visit | Attending: Hematology and Oncology | Admitting: Hematology and Oncology

## 2014-02-21 DIAGNOSIS — N281 Cyst of kidney, acquired: Secondary | ICD-10-CM | POA: Diagnosis not present

## 2014-02-21 DIAGNOSIS — J90 Pleural effusion, not elsewhere classified: Secondary | ICD-10-CM | POA: Diagnosis not present

## 2014-02-21 DIAGNOSIS — J9811 Atelectasis: Secondary | ICD-10-CM | POA: Insufficient documentation

## 2014-02-21 DIAGNOSIS — C8590 Non-Hodgkin lymphoma, unspecified, unspecified site: Secondary | ICD-10-CM | POA: Diagnosis present

## 2014-02-21 DIAGNOSIS — C8581 Other specified types of non-Hodgkin lymphoma, lymph nodes of head, face, and neck: Secondary | ICD-10-CM

## 2014-02-21 DIAGNOSIS — R599 Enlarged lymph nodes, unspecified: Secondary | ICD-10-CM | POA: Diagnosis not present

## 2014-02-21 DIAGNOSIS — I517 Cardiomegaly: Secondary | ICD-10-CM | POA: Insufficient documentation

## 2014-02-21 LAB — GLUCOSE, CAPILLARY: Glucose-Capillary: 102 mg/dL — ABNORMAL HIGH (ref 70–99)

## 2014-02-21 MED ORDER — FLUDEOXYGLUCOSE F - 18 (FDG) INJECTION
9.2600 | Freq: Once | INTRAVENOUS | Status: AC | PRN
  Administered 2014-02-21: 9.26 via INTRAVENOUS

## 2014-02-27 ENCOUNTER — Ambulatory Visit (INDEPENDENT_AMBULATORY_CARE_PROVIDER_SITE_OTHER): Payer: Medicare Other | Admitting: Cardiology

## 2014-02-27 ENCOUNTER — Encounter: Payer: Self-pay | Admitting: Cardiology

## 2014-02-27 VITALS — BP 120/78 | HR 57 | Ht 67.0 in | Wt 185.0 lb

## 2014-02-27 DIAGNOSIS — I5043 Acute on chronic combined systolic (congestive) and diastolic (congestive) heart failure: Secondary | ICD-10-CM

## 2014-02-27 DIAGNOSIS — I428 Other cardiomyopathies: Secondary | ICD-10-CM

## 2014-02-27 DIAGNOSIS — I482 Chronic atrial fibrillation, unspecified: Secondary | ICD-10-CM

## 2014-02-27 DIAGNOSIS — I10 Essential (primary) hypertension: Secondary | ICD-10-CM

## 2014-02-27 DIAGNOSIS — I255 Ischemic cardiomyopathy: Secondary | ICD-10-CM

## 2014-02-27 DIAGNOSIS — I429 Cardiomyopathy, unspecified: Secondary | ICD-10-CM

## 2014-02-27 NOTE — Assessment & Plan Note (Signed)
Continue current Lasix and potassium regimen with anticipated further diuresis and improvement in leg edema, although it may be fairly gradual. Follow-up BMET in the next one to 2 weeks.

## 2014-02-27 NOTE — Assessment & Plan Note (Signed)
LVEF 35-40% by echocardiogram in March. Follow-up study will be obtained, particularly with considerations for potential repeat chemotherapy with large B-cell lymphoma.

## 2014-02-27 NOTE — Assessment & Plan Note (Signed)
Continue Cardizem CD, digoxin, and Xarelto.

## 2014-02-27 NOTE — Progress Notes (Signed)
Reason for visit: Cardiomyopathy, atrial fibrillation  Clinical Summary Charles [name] is a 73 y.o.male last seen in September. Interval records reviewed. He is status post right neck lymph node biopsy in November revealing large B-cell lymphoma. He continues to follow with oncology, has not started any new treatments as yet with follow-up scheduled later this month. He was previously treated with bendamustine and Rituxan.   He has had problems with progressive leg edema, Lasix was doubled last week and he is slowly starting to improve. He has a known cardiomyopathy as detailed below, last assessed in March.  Lab work from November showed hemoglobin 10.0, platelets 249, potassium 4.1, BUN 24, creatinine 1.1.  Followup echocardiogram in March of this year demonstrated mild LVH with LVEF 35-40%, inferolateral hypokinesis, grade 2 diastolic dysfunction, severe left atrial enlargement, MAC with thickened mitral leaflets and moderate mitral regurgitation, sclerotic aortic valve, trivial tricuspid regurgitation, unable to assess PASP.   Allergies  Allergen Reactions  . Metoprolol Shortness Of Breath    Intolerant to all BB  . Statins     Intolerant secondary to severe myalgias    Current Outpatient Prescriptions  Medication Sig Dispense Refill  . ALPRAZolam (XANAX) 1 MG tablet Take 0.5-1 mg by mouth 3 (three) times daily. Takes one half table three times daily and one whole tablet at bedtime    . calcium carbonate (OS-CAL) 600 MG TABS tablet Take 600 mg by mouth 2 (two) times daily with a meal.    . cholecalciferol (VITAMIN D) 1000 UNITS tablet Take 1,000 Units by mouth every morning.    . digoxin (LANOXIN) 0.125 MG tablet Take 0.125 mg by mouth every morning.    . diltiazem (CARDIZEM CD) 180 MG 24 hr capsule Take 1 capsule (180 mg total) by mouth daily. 90 capsule 3  . furosemide (LASIX) 80 MG tablet Take 80 mg by mouth 2 (two) times daily.    Marland Kitchen HYDROcodone-acetaminophen (NORCO) 10-325 MG per  tablet Take 1 tablet by mouth every 6 (six) hours as needed for moderate pain.    Marland Kitchen lovastatin (MEVACOR) 10 MG tablet Take 10 mg by mouth at bedtime.     Marland Kitchen omeprazole (PRILOSEC) 20 MG capsule Take 20 mg by mouth every morning.     . potassium chloride SA (K-DUR,KLOR-CON) 20 MEQ tablet Take 20 mEq by mouth 2 (two) times daily.    Marland Kitchen PROAIR HFA 108 (90 BASE) MCG/ACT inhaler Inhale 1 puff into the lungs every 6 (six) hours as needed. For shortness of breath    . rivaroxaban (XARELTO) 20 MG TABS tablet Take 20 mg by mouth daily with supper.    . sertraline (ZOLOFT) 50 MG tablet Take 50 mg by mouth at bedtime.    . Simethicone (PHAZYME PO) Take 1 capsule by mouth daily as needed (for gas relief/stomach symptoms).    . tiotropium (SPIRIVA) 18 MCG inhalation capsule Place 18 mcg into inhaler and inhale daily as needed (for shortness of breath).     . traMADol (ULTRAM) 50 MG tablet Take 50 mg by mouth every 6 (six) hours as needed.    . polyethylene glycol (MIRALAX / GLYCOLAX) packet Take 17 g by mouth daily as needed for mild constipation or moderate constipation. Takes 3 times a week     No current facility-administered medications for this visit.   Facility-Administered Medications Ordered in Other Visits  Medication Dose Route Frequency Provider Last Rate Last Dose  . sodium chloride 0.9 % injection 10 mL  10 mL  Intracatheter PRN Pieter Partridge, MD   10 mL at 10/15/10 1230    Past Medical History  Diagnosis Date  . Essential hypertension, benign   . Atrial fibrillation 10/2008  . 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 Peptic ulcer disease   . Degenerative joint disease     Knees and shoulders  . Pneumonia 2010  . Allergic rhinitis   . Anxiety   . Drug abuse   . Tobacco abuse     50 pack years  . Mitral regurgitation     Moderate to severe May 2014  . COPD (chronic obstructive pulmonary disease)   . Hernia, inguinal, left     . Arm fracture, right 07/2013  . Hernia, inguinal, left   . Shortness of breath   . Dysrhythmia   . CHF (congestive heart failure)   . Follicular lymphoma 05/24/8248  . Basal cell carcinoma     Social History Charles [name] reports that he quit smoking about 22 months ago. His smoking use included Cigarettes. He has a 50 pack-year smoking history. He has never used smokeless tobacco. Charles [name] reports that he does not drink alcohol.  Review of Systems Complete review of systems negative except as otherwise outlined in the clinical summary and also the following. Chronically weak. Has had memory loss. NYHA class 2-3 shortness of breath. No chest pain or palpitations. No reported wheezing episodes.  Physical Examination Filed Vitals:   02/27/14 1108  BP: 120/78  Pulse: 57   Filed Weights   02/27/14 1108  Weight: 185 lb (83.915 kg)    Chronically ill-appearing male in no distress.  HEENT: Conjunctiva and lids normal, oropharynx clear.  Neck: Supple, no elevated JVP or carotid bruits, no thyromegaly.  Lungs: Clear to auscultation, decreased in bases, nonlabored breathing at rest.  Cardiac: Irregularly irregular, no S3, apical systolic murmur, no pericardial rub.  Abdomen: Soft, nontender, bowel sounds present, no guarding or rebound.  Extremities: 2+ bilateral leg edema and stasis, distal pulses1- 2+. Skin: Warm and dry.  Musculoskeletal: No kyphosis.  Neuropsychiatric: Alert and oriented x3, flat affect .   Problem List and Plan   Acute on chronic combined systolic and diastolic heart failure Continue current Lasix and potassium regimen with anticipated further diuresis and improvement in leg edema, although it may be fairly gradual. Follow-up BMET in the next one to 2 weeks.  Nonischemic cardiomyopathy LVEF 35-40% by echocardiogram in March. Follow-up study will be obtained, particularly with considerations for potential repeat chemotherapy with large B-cell  lymphoma.  Essential hypertension, benign Moderate by last echocardiogram.  Atrial fibrillation Continue Cardizem CD, digoxin, and Xarelto.    Satira Sark, M.D., F.A.C.C.

## 2014-02-27 NOTE — Patient Instructions (Signed)
Your physician recommends that you schedule a follow-up appointment in: 3 months with [facility]    Your physician recommends that you continue on your current medications as directed. Please refer to the Current Medication list given to you today.   Your physician has requested that you have an echocardiogram. Echocardiography is a painless test that uses sound waves to create images of your heart. It provides your doctor with information about the size and shape of your heart and how well your heart's chambers and valves are working. This procedure takes approximately one hour. There are no restrictions for this procedure.    Please get lab work in 1 week (BMET)   Thank you for choosing [facility] !

## 2014-02-27 NOTE — Assessment & Plan Note (Signed)
Moderate by last echocardiogram.

## 2014-03-02 ENCOUNTER — Telehealth (HOSPITAL_COMMUNITY): Payer: Self-pay

## 2014-03-02 ENCOUNTER — Ambulatory Visit (HOSPITAL_COMMUNITY)
Admission: RE | Admit: 2014-03-02 | Discharge: 2014-03-02 | Disposition: A | Discharge status: Home / Self Care | Payer: Medicare Other | Source: Ambulatory Visit | Attending: Cardiology | Admitting: Cardiology

## 2014-03-02 ENCOUNTER — Ambulatory Visit (HOSPITAL_COMMUNITY): Payer: Medicare Other

## 2014-03-02 DIAGNOSIS — I059 Rheumatic mitral valve disease, unspecified: Secondary | ICD-10-CM

## 2014-03-02 DIAGNOSIS — I428 Other cardiomyopathies: Secondary | ICD-10-CM

## 2014-03-02 DIAGNOSIS — I255 Ischemic cardiomyopathy: Secondary | ICD-10-CM | POA: Insufficient documentation

## 2014-03-02 NOTE — Telephone Encounter (Signed)
Message left for patient.  To call back with any questions. 

## 2014-03-02 NOTE — Telephone Encounter (Signed)
-----   Message from Farrel Gobble, MD sent at 03/01/2014 12:29 PM EST ----- Regarding: RE: Question No contraindication to tooth extraction. ----- Message -----    From: Mellissa Kohut, RN    Sent: 03/01/2014  12:19 PM      To: Farrel Gobble, MD Subject: FW: Question                                   Any issues with this patient having tooth pulled? Collie Siad  ----- Message -----    From: Louis Meckel    Sent: 03/01/2014  11:08 AM      To: Farrel Gobble, MD, Mellissa Kohut, RN Subject: Question                                       Patients wife called and wanted to know if it would be ok for him to have a tooth pulled.  The dentist told them to call us.  Thanks

## 2014-03-02 NOTE — Progress Notes (Signed)
  Echocardiogram 2D Echocardiogram has been performed.  [name], Charles 03/02/2014, 3:14 PM

## 2014-03-11 ENCOUNTER — Other Ambulatory Visit: Payer: Self-pay | Admitting: Nurse Practitioner

## 2014-03-13 ENCOUNTER — Encounter (HOSPITAL_COMMUNITY): Payer: Self-pay | Admitting: Hematology & Oncology

## 2014-03-13 ENCOUNTER — Encounter (HOSPITAL_COMMUNITY): Attending: Oncology | Admitting: Hematology & Oncology

## 2014-03-13 VITALS — BP 113/56 | HR 94 | Temp 97.8°F | Resp 20

## 2014-03-13 DIAGNOSIS — C8331 Diffuse large B-cell lymphoma, lymph nodes of head, face, and neck: Secondary | ICD-10-CM

## 2014-03-13 DIAGNOSIS — C8581 Other specified types of non-Hodgkin lymphoma, lymph nodes of head, face, and neck: Secondary | ICD-10-CM

## 2014-03-13 DIAGNOSIS — C8290 Follicular lymphoma, unspecified, unspecified site: Secondary | ICD-10-CM | POA: Insufficient documentation

## 2014-03-13 NOTE — Progress Notes (Signed)
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    Maintenance rituxan course complicated by admission for pseudomonas pneumonia/bacteremia and sepsis in 01/2012. Additional Rituxan Discontinued.  March of this year demonstrated mild LVH with LVEF 35-40%, inferolateral hypokinesis, grade 2 diastolic dysfunction, severe left atrial enlargement, MAC with thickened mitral leaflets and moderate mitral regurgitation, sclerotic aortic valve, trivial tricuspid regurgitation, unable to assess PASP.  Lymph node biopsy R neck: 01/31/2014  Diffuse large Cell lymphoma.  Immunohistochemical stains were performed and show that the large atypical lymphoid cells are positive for LCA, CD21, CD79a, BCL-6, BCL-2 with patchy positivity for CD30. No significant CD138, CD20, or CD10 positivity is identified. The lack of CD20 expression is likely due to prior treatment.  CURRENT THERAPY:Observation  INTERVAL HISTORY: Charles [name] 73 y.o. male returns for follow-up recent imaging. He has a marginal performance status, and he and his wife state he is no longer able to walk. He describes weakness from the knees down. He has a history of grade 3 follicular lymphoma, a new right-sided neck mass with biopsy showing diffuse large cell lymphoma. (CD20 negative)  PET imaging shows disease still confined to the right neck therefore consistent with stage I disease. He denies B symptoms. His appetite is marginal. He is wheelchair bound and his wife is looking to build a ramp to get him into and out of their home.   He accidentally took him am medications and his noon medications at the same time today. He is somewhat sleepy and occasionally confused.    Past Medical History  Diagnosis Date  . Essential hypertension, benign   . Atrial fibrillation 10/2008  . 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 Peptic ulcer disease   . Degenerative joint disease     Knees and shoulders  . Pneumonia 2010  . Allergic rhinitis   . Anxiety   . Drug abuse   . Tobacco abuse     50 pack years  . Mitral regurgitation     Moderate to severe May 2014  . COPD (chronic obstructive pulmonary disease)   . Hernia, inguinal, left   . Arm fracture, right 07/2013  . Hernia, inguinal, left   . Shortness of breath   . Dysrhythmia   . CHF (congestive heart failure)   . Follicular lymphoma 10/25/3823  . 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      Follicular lymphoma   0/53/9767 Imaging CT soft tissue of neck- Adenopathy more notable on right neck than left.   06/07/2010 Initial Diagnosis FNA of right neck mass concerning for lymphoma.   06/24/2010 Procedure Excisional lymph node biopsy- HA19 NHL, follicular type, grade III.   07/09/2010 Bone Marrow Biopsy Bone marrow aspiration and biopsy- rare minute atypical lymphoid aggregates, cellular marrow with complete trilineage  hematopoiesis, no blasts found on peripheral blood   07/11/2010 Imaging PET- confluent right cervical lymphadenopathy with a foci within the skeleton and dominant left iliac  lesions   07/17/2010 - 11/05/2010 Chemotherapy Bendamustine + Rituxan x 6 cycles   09/12/2010 Remission PET- Interval complete metabolic response to therapy    11/20/2010 Imaging PET- No evidence of residual or recurrent hypermetabolic lymphoma.    02/17/2011 - 12/09/2011 Chemotherapy Rituxan every 90 days maintenance x 4.  D/C'd due to pseudomonas bacteremia   06/06/2011 Imaging PET- No specific features identified to suggest residual or recurrence of tumor.   01/30/2012 Adverse Reaction Pseudomonas bacteremia   06/22/2012 Imaging PET- No findings to suggest lymphomatous involvement in the neck, chest, abdomen, or pelvis.   07/06/2013 Imaging CT CAP- Stable mediastinal and hilar lymph nodes. No findings for lymphoma in the abdomen or pelvis.   11/14/2013 Imaging CT soft tissues of the neck revealed bulky right cervical lymphadenopathy with stable mediastinal and hilar disease.   12/16/2013 Surgery fine-needle aspirate right neck nondiagnostic.   01/31/2014 Surgery Biopsy right cervical lymph node by Dr.Teoh consistent with diffuse large B-cell lymphoma.    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 Open biopsy of deep R neck mass by Leta Baptist, MD   02/21/2014 PET scan Hypermetabolic lymphoma in the R neck involving stations 2 - 4. Progressive cardiac enlargement suspicious for cardiomyopathy     is allergic to metoprolol and statins.  Charles [name] does not currently have medications on file.  Past Surgical History  Procedure Laterality Date  . Neck lesion biopsy  June 23, 4852    Follicular lymphoma  . Skin cancer excision      under left breast; variously described as melanoma and basal cell  . Rotator cuff repair      right  . Patella fracture surgery Left 1975  . Hematoma evacuation      after melanoma removal  . Portacath placement  07/12/2010  . Colonoscopy  2009    Also underwent an EGD; patient reports no significant  findings  . Bone marrow aspiration  06/2012  . Bone marrow biopsy  06/2012  . Fungal infection    . Port-a-cath removal Left 01/17/2013    Procedure: REMOVAL PORT-A-CATH;  Surgeon: Scherry Ran, MD;  Location: [facility];  Service: General;  Laterality: Left;  . Inguinal hernia repair Left 12/16/2013    Procedure: HERNIA REPAIR INGUINAL ADULT;  Surgeon: Scherry Ran, MD;  Location: [facility];  Service: General;  Laterality: Left;  Marland Kitchen Mass biopsy Right 12/16/2013    Procedure: FINE NEEDLE ASPIRATION NECK MASS;  Surgeon: Scherry Ran, MD;  Location: [facility];  Service: General;  Laterality: Right;  . Mass biopsy Right 01/31/2014    Procedure: INCISIONAL BIOPSY OF RIGHT NECK MASS ;  Surgeon: Ascencion Dike, MD;  Location: [facility];  Service: ENT;  Laterality: Right;    Review of Systems  Constitutional: Positive for malaise/fatigue. Negative for fever, chills, weight loss and diaphoresis.  HENT: Negative.  Negative for congestion, ear pain, hearing loss, nosebleeds, sore throat and tinnitus.   Eyes: Negative for blurred vision, double vision, photophobia and redness.  Respiratory: Positive for shortness of breath. Negative for cough, hemoptysis, sputum production, wheezing and stridor.        Regular O2 use  Cardiovascular: Positive for orthopnea and leg swelling. Negative for chest pain, palpitations and claudication.  Gastrointestinal: Negative for heartburn, nausea, vomiting, constipation, blood in stool and melena.  Genitourinary: Negative for dysuria, urgency, hematuria and flank pain.  Musculoskeletal:       Weakness, LE weakness, unable to ambulate  Skin: Negative for itching and rash.  Neurological: Positive for speech change, focal weakness and weakness. Negative for dizziness, tingling, sensory change, seizures, loss of consciousness and headaches.  Endo/Heme/Allergies: Negative for polydipsia. Does not bruise/bleed easily.  Psychiatric/Behavioral: Negative for  depression, suicidal ideas and memory loss. The patient does not have insomnia.     PHYSICAL EXAMINATION  ECOG PERFORMANCE STATUS: 2 - Symptomatic, <50% confined to bed  Filed Vitals:   03/13/14 1300  BP: 113/56  Pulse: 94  Temp: 97.8 F (36.6 C)  Resp: 20    Physical Exam  Constitutional: He is well-developed, well-nourished, and in no distress. No distress.  HENT:  Head: Normocephalic and atraumatic.  Mouth/Throat: No oropharyngeal exudate.  Eyes: Conjunctivae and EOM are normal. Pupils are equal, round, and reactive to light. No scleral icterus.  Neck: Normal range of motion. Neck supple. No tracheal deviation present. No thyromegaly present.  Palpable but mobile large R sided neck mass c/w diagnosed lymphoma, no other adenopathy noted  Cardiovascular: Normal rate and normal heart sounds.  Exam reveals no gallop.   Pulmonary/Chest: Effort normal.  Wearing O2  Abdominal: Soft. Bowel sounds are normal. He exhibits no mass. There is no tenderness. There is no rebound and no guarding.  Exam limited, patient in a wheelchair and unable to stand or transfer  Musculoskeletal: He exhibits edema. He exhibits no tenderness.  Neurological: No cranial nerve deficit.  Occasionally confused, mild sedation most likely secondary to medications.  Skin: Skin is warm and dry. No erythema.  Psychiatric: Mood normal.    LABORATORY DATA:  CBC    Component Value Date/Time   WBC 6.1 02/08/2014 1156   RBC 3.31* 02/08/2014 1156   RBC 4.07* 11/08/2013 1530   HGB 10.0* 02/08/2014 1156   HCT 30.7* 02/08/2014 1156   PLT 249 02/08/2014 1156   MCV 92.7 02/08/2014 1156   MCH 30.2 02/08/2014 1156   MCHC 32.6 02/08/2014 1156   RDW 15.5 02/08/2014 1156   LYMPHSABS 0.5* 02/08/2014 1156   MONOABS 0.5 02/08/2014 1156   EOSABS 0.2 02/08/2014 1156   BASOSABS 0.1 02/08/2014 1156   CMP     Component Value Date/Time   NA 141 02/08/2014 1156   K 4.1 02/08/2014 1156   CL 97 02/08/2014 1156   CO2  33* 02/08/2014 1156   GLUCOSE 92 02/08/2014 1156   BUN 24* 02/08/2014 1156   CREATININE 1.19 02/08/2014 1156   CREATININE 1.68* 10/30/2012 1112   CALCIUM 9.7 02/08/2014 1156   PROT 7.2 02/08/2014 1156   ALBUMIN 3.5 02/08/2014 1156   AST 20 02/08/2014 1156   ALT 21 02/08/2014 1156   ALKPHOS 92 02/08/2014 1156   BILITOT 0.6 02/08/2014 1156   GFRNONAA 58* 02/08/2014 1156   GFRAA 68* 02/08/2014 1156     RADIOGRAPHIC STUDIES:  EXAM: NUCLEAR MEDICINE PET SKULL BASE TO THIGH  TECHNIQUE: 9.26 mCi F-18 FDG was injected intravenously. Full-ring PET imaging was performed from the skull base to thigh after the radiotracer. CT data was obtained and used for attenuation correction and anatomic localization.  FASTING BLOOD GLUCOSE: Value: 102 mg/dl  COMPARISON: Abdominal pelvic CT 01/16/2014. PET-CT 06/21/2012.  FINDINGS: NECK  There is recurrent homogeneously hypermetabolic right cervical lymphadenopathy. An approximately 2.8 x 2.2 cm level 2 node on image  38 has an SUV max of 21.8. This is contiguous with adenopathy more superiorly measuring 4.2 x 2.0 cm on image 30. There are 2 hypermetabolic level 4 nodes, including a 2.2 x 1.5 cm node on image 49 which has an SUV max of 9.8. No hypermetabolic left cervical adenopathy demonstrated.There are no lesions of the pharyngeal mucosal space.  CHEST  There are no hypermetabolic mediastinal, hilar or axillary lymph nodes. There are stable small mediastinal and hilar lymph nodes bilaterally. There is no hypermetabolic pulmonary activity. The patient has developed a small right pleural effusion with extension into the fissure. There is associated bibasilar atelectasis. The previously demonstrated left lower lobe airspace disease has resolved. There is progressive cardiomegaly with four-chamber enlargement and associated hypermetabolic activity throughout the myocardium. There is no significant pericardial effusion.  Coronary artery calcifications are noted.  ABDOMEN/PELVIS  There is no hypermetabolic activity within the liver, adrenal glands, spleen or pancreas. There is no hypermetabolic nodal activity. There is stable mild nodularity of the right adrenal gland and stable left renal cysts. Aortoiliac atherosclerosis and bladder wall thickening appear unchanged.  SKELETON  There is no hypermetabolic activity to suggest osseous metastatic disease.  IMPRESSION: 1. Recurrent hypermetabolic lymphoma in the right neck involving stations 2 through 4. No lesions of the pharyngeal mucosal space identified. 2. No hypermetabolic nodal activity within the chest, abdomen or pelvis. The spleen is normal in size with normal metabolic activity. 3. Progressive cardiac enlargement with diffusely increased myocardial activity suspicious for cardiomyopathy.   Electronically Signed  By: Camie Patience M.D.  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 CD20 negative by IHC.  He is wheelchair bound, EF last in march of 35%, O2 dependent.  He developed pseudomonas pneumonia/sepsis during Rituxan maintenance for a grade III follicular lymphoma and this was discontinued. (01/2012) In addition he has had 5 hospital admissions since 07/2012 secondary to heart failure. His performance status continues to decline.  He and his wife are very interested in pursuing therapy. Since his disease is CD20 negative I feel this further complicates his therapy options. He may be able to tolerate consolidative XRT to the R neck (although transportation and his limited mobility would be challenging)  I discussed with the patient and his wife several options, including palliative care and hospice but therapy is their desire.  I would like to present him at the hematology tumor board to  discuss potential tolerable therapeutic options.  I explained this to the patient and his wife in detail.  They will be contacted after he has been presented with final recommendations at that time.    All questions were answered. The patient knows to call the clinic with any problems, questions or concerns. We can certainly see the patient much sooner if necessary.   Molli Hazard 03/19/2014

## 2014-03-13 NOTE — Patient Instructions (Signed)
[facility] Discharge Instructions  RECOMMENDATIONS MADE BY THE CONSULTANT AND ANY TEST RESULTS WILL BE SENT TO YOUR REFERRING PHYSICIAN.  We reviewed your PET/CT today and it shows that the cancer is still only present in the R neck. We discussed options to manage your cancer and you would like to try chemotherapy. We will arrange to treat with treanda/rituxan next week.  In addition, we will work on a referral to radiation oncology as radiation may be a part of your treatment. Please call with questions/concerns prior to your next visit. You have been provided with reading information, please bring questions to your next visit as well.  Thank you for choosing [facility] to provide your oncology and hematology care.  To afford each patient quality time with our providers, please arrive at least 15 minutes before your scheduled appointment time.  With your help, our goal is to use those 15 minutes to complete the necessary work-up to ensure our physicians have the information they need to help with your evaluation and healthcare recommendations.    Effective January 1st, 2014, we ask that you re-schedule your appointment with our physicians should you arrive 10 or more minutes late for your appointment.  We strive to give you quality time with our providers, and arriving late affects you and other patients whose appointments are after yours.    Again, thank you for choosing [facility].  Our hope is that these requests will decrease the amount of time that you wait before being seen by our physicians.       _____________________________________________________________  Should you have questions after your visit to [facility], please contact our office at [phone] between the hours of 8:30 a.m. and 5:00 p.m.  Voicemails left after 4:30 p.m. will not be returned until the following business day.  For prescription refill requests,  have your pharmacy contact our office with your prescription refill request.

## 2014-03-19 NOTE — Assessment & Plan Note (Signed)
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 CD20 negative by IHC.  He is wheelchair bound, EF last in march of 35%, O2 dependent.  He developed pseudomonas pneumonia/sepsis during Rituxan maintenance for a grade III follicular lymphoma and this was discontinued. (01/2012) In addition he has had 5 hospital admissions since 07/2012 secondary to heart failure. His performance status continues to decline.  He and his wife are very interested in pursuing therapy. Since his disease is CD20 negative I feel this further complicates his therapy options. He may be able to tolerate consolidative XRT to the R neck (although transportation and his limited mobility would be challenging)  I discussed with the patient and his wife several options, including palliative care and hospice but therapy is their desire.  I would like to present him at the hematology tumor board to discuss potential tolerable therapeutic options.  I explained this to the patient and his wife in detail.  They will be contacted after he has been presented with final recommendations at that time.

## 2014-03-22 ENCOUNTER — Inpatient Hospital Stay (HOSPITAL_COMMUNITY): Payer: Medicare Other

## 2014-03-23 ENCOUNTER — Inpatient Hospital Stay (HOSPITAL_COMMUNITY): Payer: Medicare Other

## 2014-03-27 NOTE — Progress Notes (Signed)
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    Maintenance rituxan course complicated by admission for pseudomonas pneumonia/bacteremia and sepsis in 01/2012. Additional Rituxan Discontinued.  March of this year demonstrated mild LVH with LVEF 35-40%, inferolateral hypokinesis, grade 2 diastolic dysfunction, severe left atrial enlargement, MAC with thickened mitral leaflets and moderate mitral regurgitation, sclerotic aortic valve, trivial tricuspid regurgitation, unable to assess PASP.  Lymph node biopsy R neck: 01/31/2014  Diffuse large Cell lymphoma.  Immunohistochemical stains were performed and show that the large atypical lymphoid cells are positive for LCA, CD21, CD79a, BCL-6, BCL-2 with patchy positivity for CD30. No significant CD138, CD20, or CD10 positivity is identified. The lack of CD20 expression is likely due to prior treatment.  CURRENT THERAPY:Observation  INTERVAL HISTORY: Charles [name] 74 y.o. male returns for follow-up of his DLBCL. He was presented at tumor board this am at [facility].  He is actually doing better today, he was confused about his medications at his last visit and accidentally took his afternoon and am dose together.  He is alert and talkative. His daughter is here with him today. He is anxious to hear about potential treatment options.     Past Medical History  Diagnosis Date  . Essential hypertension, benign   . Atrial fibrillation 10/2008  . 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 Peptic ulcer disease   . Degenerative joint disease     Knees and shoulders  . Pneumonia 2010  . Allergic rhinitis   . Anxiety   . Drug abuse   . Tobacco abuse     50 pack years  . Mitral regurgitation       Moderate to severe May 2014  . COPD (chronic obstructive pulmonary disease)   . Hernia, inguinal, left   . Arm fracture, right 07/2013  . Hernia, inguinal, left   . Shortness of breath   . Dysrhythmia   . CHF (congestive heart failure)   . Follicular lymphoma 03/29/8370  . 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      Follicular lymphoma   11/23/1113 Imaging CT soft tissue of neck- Adenopathy more notable on right neck than left.   06/07/2010 Initial Diagnosis FNA of right neck mass concerning for lymphoma.   06/24/2010 Procedure Excisional lymph node biopsy- ZM08 NHL, follicular type, grade III.   07/09/2010 Bone Marrow Biopsy Bone marrow aspiration and biopsy- rare minute atypical lymphoid aggregates, cellular marrow with complete trilineage hematopoiesis, no blasts found on peripheral blood   07/11/2010 Imaging PET- confluent right cervical lymphadenopathy with a foci within the skeleton and dominant left iliac lesions   07/17/2010 - 11/05/2010 Chemotherapy Bendamustine + Rituxan x 6 cycles   09/12/2010 Remission PET- Interval complete metabolic response to therapy    11/20/2010 Imaging PET- No evidence of residual or recurrent hypermetabolic lymphoma.  02/17/2011 - 12/09/2011 Chemotherapy Rituxan every 90 days maintenance x 4.  D/C'd due to pseudomonas bacteremia   06/06/2011 Imaging PET- No specific features identified to suggest  residual or recurrence of tumor.   01/30/2012 Adverse Reaction Pseudomonas bacteremia   06/22/2012 Imaging PET- No findings to suggest lymphomatous involvement in the neck, chest, abdomen, or pelvis.   07/06/2013 Imaging CT CAP- Stable mediastinal and hilar lymph nodes. No findings for lymphoma in the abdomen or pelvis.   11/14/2013 Imaging CT soft tissues of the neck revealed bulky right cervical lymphadenopathy with stable mediastinal and hilar disease.   12/16/2013 Surgery fine-needle aspirate right neck nondiagnostic.   01/31/2014 Surgery Biopsy right cervical lymph node by Dr.Teoh consistent with diffuse large B-cell lymphoma.    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 Open biopsy of deep R neck mass by Leta Baptist, MD   02/21/2014 PET scan Hypermetabolic lymphoma in the R neck involving stations 2 - 4. Progressive cardiac enlargement suspicious for cardiomyopathy   03/29/2014 -  Chemotherapy Bendamustine on days 1 and 2 every 28 days     is allergic to metoprolol and statins.  Mr. Beber does not currently have medications on file.  Past Surgical History  Procedure Laterality Date  . Neck lesion biopsy  June 24, 4095    Follicular lymphoma  . Skin cancer excision      under left breast; variously described as melanoma and basal cell  . Rotator cuff repair      right  . Patella fracture surgery Left 1975  . Hematoma evacuation      after melanoma removal  . Portacath placement  07/12/2010  . Colonoscopy  2009    Also underwent an EGD; patient reports no significant findings  . Bone marrow aspiration  06/2012  . Bone marrow biopsy  06/2012  . Fungal infection    . Port-a-cath removal Left 01/17/2013    Procedure: REMOVAL PORT-A-CATH;  Surgeon: Scherry Ran, MD;  Location: [facility];  Service: General;  Laterality: Left;  . Inguinal hernia repair Left 12/16/2013    Procedure: HERNIA REPAIR INGUINAL ADULT;   Surgeon: Scherry Ran, MD;  Location: [facility];  Service: General;  Laterality: Left;  Marland Kitchen Mass biopsy Right 12/16/2013    Procedure: FINE NEEDLE ASPIRATION NECK MASS;  Surgeon: Scherry Ran, MD;  Location: [facility];  Service: General;  Laterality: Right;  . Mass biopsy Right 01/31/2014    Procedure: INCISIONAL BIOPSY OF RIGHT NECK MASS ;  Surgeon: Ascencion Dike, MD;  Location: [facility];  Service: ENT;  Laterality: Right;    Review of Systems  Constitutional: Positive for malaise/fatigue. Negative for fever, chills, weight loss and diaphoresis.  HENT: Negative.  Negative for congestion, ear pain, hearing loss, nosebleeds, sore throat and tinnitus.   Eyes: Negative for blurred vision, double vision, photophobia and redness.  Respiratory: Positive for shortness of breath. Negative for cough, hemoptysis, sputum production, wheezing and stridor.        Regular O2 use  Cardiovascular: Positive for orthopnea and leg swelling. Negative for chest pain, palpitations and claudication.  Gastrointestinal: Negative for heartburn, nausea, vomiting, constipation, blood in stool and melena.  Genitourinary: Negative for dysuria, urgency, hematuria and flank pain.  Musculoskeletal:       Weakness, LE weakness, unable to ambulate  Skin: Negative for itching and rash.  Neurological: Positive for weakness. Negative for dizziness,  tingling, tremors, sensory change, speech change, focal weakness, seizures, loss of consciousness and headaches.  Endo/Heme/Allergies: Negative for polydipsia. Does not bruise/bleed easily.  Psychiatric/Behavioral: Negative for depression, suicidal ideas and memory loss. The patient does not have insomnia.     PHYSICAL EXAMINATION  ECOG PERFORMANCE STATUS: 2 - Symptomatic, <50% confined to bed  Filed Vitals:   03/28/14 1450  BP: 106/68  Pulse: 67  Temp: 97.8 F (36.6 C)  Resp: 20    Physical Exam  Constitutional: He is oriented to person, place, and time  and well-developed, well-nourished, and in no distress. No distress.  HENT:  Head: Normocephalic and atraumatic.  Mouth/Throat: No oropharyngeal exudate.  Eyes: Conjunctivae and EOM are normal. Pupils are equal, round, and reactive to light. No scleral icterus.  Neck: Normal range of motion. Neck supple. No tracheal deviation present. No thyromegaly present.  Palpable but mobile large R sided neck mass c/w diagnosed lymphoma, no other adenopathy noted  Cardiovascular: Normal rate and normal heart sounds.  Exam reveals no gallop.   Pulmonary/Chest: Effort normal.  Wearing O2  Abdominal: Soft. Bowel sounds are normal. He exhibits no mass. There is no tenderness. There is no rebound and no guarding.  Exam limited, patient in a wheelchair and unable to stand or transfer  Musculoskeletal: He exhibits edema. He exhibits no tenderness.  Neurological: He is alert and oriented to person, place, and time. No cranial nerve deficit.  Skin: Skin is warm and dry. No erythema.  Psychiatric: Mood normal.    LABORATORY DATA:  CBC    Component Value Date/Time   WBC 5.1 03/30/2014 1101   RBC 3.42* 03/30/2014 1101   RBC 4.07* 11/08/2013 1530   HGB 9.8* 03/30/2014 1101   HCT 30.7* 03/30/2014 1101   PLT 190 03/30/2014 1101   MCV 89.8 03/30/2014 1101   MCH 28.7 03/30/2014 1101   MCHC 31.9 03/30/2014 1101   RDW 16.4* 03/30/2014 1101   LYMPHSABS 0.6* 03/30/2014 1101   MONOABS 0.4 03/30/2014 1101   EOSABS 0.3 03/30/2014 1101   BASOSABS 0.1 03/30/2014 1101   CMP     Component Value Date/Time   NA 138 03/30/2014 1101   K 3.8 03/30/2014 1101   CL 97 03/30/2014 1101   CO2 35* 03/30/2014 1101   GLUCOSE 110* 03/30/2014 1101   BUN 29* 03/30/2014 1101   CREATININE 1.33 03/30/2014 1101   CREATININE 1.68* 10/30/2012 1112   CALCIUM 9.2 03/30/2014 1101   PROT 7.0 03/30/2014 1101   ALBUMIN 3.6 03/30/2014 1101   AST 21 03/30/2014 1101   ALT 14 03/30/2014 1101   ALKPHOS 102 03/30/2014 1101   BILITOT  0.9 03/30/2014 1101   GFRNONAA 51* 03/30/2014 1101   GFRAA 59* 03/30/2014 1101     RADIOGRAPHIC STUDIES:  EXAM: NUCLEAR MEDICINE PET SKULL BASE TO THIGH  TECHNIQUE: 9.26 mCi F-18 FDG was injected intravenously. Full-ring PET imaging was performed from the skull base to thigh after the radiotracer. CT data was obtained and used for attenuation correction and anatomic localization.  FASTING BLOOD GLUCOSE: Value: 102 mg/dl  COMPARISON: Abdominal pelvic CT 01/16/2014. PET-CT 06/21/2012.  FINDINGS: NECK  There is recurrent homogeneously hypermetabolic right cervical lymphadenopathy. An approximately 2.8 x 2.2 cm level 2 node on image 38 has an SUV max of 21.8. This is contiguous with adenopathy more superiorly measuring 4.2 x 2.0 cm on image 30. There are 2 hypermetabolic level 4 nodes, including a 2.2 x 1.5 cm node on image 49 which has an SUV max  of 9.8. No hypermetabolic left cervical adenopathy demonstrated.There are no lesions of the pharyngeal mucosal space.  CHEST  There are no hypermetabolic mediastinal, hilar or axillary lymph nodes. There are stable small mediastinal and hilar lymph nodes bilaterally. There is no hypermetabolic pulmonary activity. The patient has developed a small right pleural effusion with extension into the fissure. There is associated bibasilar atelectasis. The previously demonstrated left lower lobe airspace disease has resolved. There is progressive cardiomegaly with four-chamber enlargement and associated hypermetabolic activity throughout the myocardium. There is no significant pericardial effusion. Coronary artery calcifications are noted.  ABDOMEN/PELVIS  There is no hypermetabolic activity within the liver, adrenal glands, spleen or pancreas. There is no hypermetabolic nodal activity. There is stable mild nodularity of the right adrenal gland and stable left renal cysts. Aortoiliac atherosclerosis and bladder wall  thickening appear unchanged.  SKELETON  There is no hypermetabolic activity to suggest osseous metastatic disease.  IMPRESSION: 1. Recurrent hypermetabolic lymphoma in the right neck involving stations 2 through 4. No lesions of the pharyngeal mucosal space identified. 2. No hypermetabolic nodal activity within the chest, abdomen or pelvis. The spleen is normal in size with normal metabolic activity. 3. Progressive cardiac enlargement with diffusely increased myocardial activity suspicious for cardiomyopathy.   Electronically Signed  By: Camie Patience M.D.  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 He has been diagnosed with a stage I versus stage II diffuse large cell lymphoma of the right neck. Tumor is CD20 negative; he has had prior exposure to Rituxan. He has no B symptoms. IPI score is a 3. He was presented at hematology tumor Board this week. The patient and his family desire treatment and we discussed single agent Treanda. He has had Treanda in the past and had good tolerance. He is much clearer today; at our first visit he had taken both morning and afternoon doses of medications and had quite a lot of fatigue. He does not wish to have a port put back in and we discussed PICC line placement which he agrees to. I advised he and his family we will take his treatment from week to week and if he is doing well the goal will be for 3 cycles. We will be able to gauge response by the size of the right neck lesion. He may be able to tolerate consolidative radiation after 3 cycles of Treanda.     All questions were answered. The patient knows to call the clinic with any problems, questions or concerns. We can certainly see the patient much sooner if necessary.   Molli Hazard 04/08/2014

## 2014-03-28 ENCOUNTER — Encounter (HOSPITAL_COMMUNITY): Payer: Medicare Other | Attending: Oncology | Admitting: Hematology & Oncology

## 2014-03-28 ENCOUNTER — Encounter (HOSPITAL_COMMUNITY): Payer: Self-pay | Admitting: Hematology & Oncology

## 2014-03-28 ENCOUNTER — Telehealth (HOSPITAL_COMMUNITY): Payer: Self-pay

## 2014-03-28 VITALS — BP 106/68 | HR 67 | Temp 97.8°F | Resp 20 | Wt 174.0 lb

## 2014-03-28 DIAGNOSIS — C8290 Follicular lymphoma, unspecified, unspecified site: Secondary | ICD-10-CM | POA: Insufficient documentation

## 2014-03-28 DIAGNOSIS — C8581 Other specified types of non-Hodgkin lymphoma, lymph nodes of head, face, and neck: Secondary | ICD-10-CM

## 2014-03-28 DIAGNOSIS — C8331 Diffuse large B-cell lymphoma, lymph nodes of head, face, and neck: Secondary | ICD-10-CM | POA: Diagnosis not present

## 2014-03-28 MED ORDER — ACYCLOVIR 400 MG PO TABS: 400.0000 mg | ORAL_TABLET | Freq: Every day | ORAL | Status: AC

## 2014-03-28 MED ORDER — ONDANSETRON HCL 8 MG PO TABS: 8.0000 mg | ORAL_TABLET | Freq: Three times a day (TID) | ORAL | Status: AC | PRN

## 2014-03-28 NOTE — Patient Instructions (Addendum)
Charles [name]   CHEMOTHERAPY INSTRUCTIONS  Premeds: Zofran - this medication decreases nausea   Dexamethasone - steroid - this medication works to also decrease nausea/vomiting. These medications will be given together and will take 30 minutes to infuse. You will receive these medications on Day 1 and Day 2.  Bendamustine - low white blood cell count, low platelet count, fever, nausea, vomiting. This infuses over 60 minutes. You will receive this medication on Day 1 and Day 2 every 28 days.   POTENTIAL SIDE EFFECTS OF TREATMENT: Increased Susceptibility to Infection, Constipation, Changes in Character of Skin and Nails (brittleness, dryness,etc.), Bone Marrow Suppression, Nausea, Diarrhea and Mouth Sores   EDUCATIONAL MATERIALS GIVEN AND REVIEWED: Specific Instructions Sheets: Bendamustine   SELF CARE ACTIVITIES WHILE ON CHEMOTHERAPY:  Increase your fluid intake 48 hours prior to treatment and drink at least 2 quarts per day after treatment., No alcohol intake., No aspirin or other medications unless approved by your oncologist., Eat foods that are light and easy to digest., Eat foods at cold or room temperature., No fried, fatty, or spicy foods immediately before or after treatment., Have teeth cleaned professionally before starting treatment. Keep dentures and partial plates clean., Use soft toothbrush and do not use mouthwashes that contain alcohol. Biotene is a good mouthwash that is available at most pharmacies or may be ordered by calling [phone]., Use warm salt water gargles (1 teaspoon salt per 1 quart warm water) before and after meals and at bedtime. Or you may rinse with 2 tablespoons of three -percent hydrogen peroxide mixed in eight ounces of water., Always use sunscreen with SPF (Sun Protection Factor) of 30 or higher., Use your nausea medication as directed to prevent nausea., Use your stool softener or laxative as directed to prevent  constipation. and Use your anti-diarrheal medication as directed to stop diarrhea.  Please wash your hands for at least 30 seconds using warm soapy water. Handwashing is the #1 way to prevent the spread of germs. Stay away from sick people or people who are getting over a cold. If you develop respiratory systems such as green/yellow mucus production or productive cough or persistent cough let us know and we will see if you need an antibiotic. It is a good idea to keep a pair of gloves on when going into grocery stores/[employer] to decrease your risk of coming into contact with germs on the carts, etc. Carry alcohol hand gel with you at all times and use it frequently if out in public. All foods need to be cooked thoroughly. No raw foods. No medium or undercooked meats, eggs. If your food is cooked medium well, it does not need to be hot pink or saturated with bloody liquid at all. Vegetables and fruits need to be washed/rinsed under the faucet with a dish detergent before being consumed. You can eat raw fruits and vegetables unless we tell you otherwise but it would be best if you cooked them or bought frozen. Do not eat off of salad bars or hot bars unless you really trust the cleanliness of the restaurant. If you need dental work, please let Dr. Whitney Muse know before you go for your appointment so that we can coordinate the best possible time for you in regards to your chemo regimen. You need to also let your dentist know that you are actively taking chemo. We may need to do labs prior to your dental appointment. We also want your bowels moving at least every  other day. If this is not happening, we need to know so that we can get you on a bowel regimen to help you go.       MEDICATIONS: You have been given prescriptions for the following medications:  Acyclovir 400mg  tablet. Take 1 tablet daily. Before the bottle is empty get it refilled. There are 3 refills. Take all the refills as well.   Zofran 8mg   tablet. Take 1 tablet every 8 hours as needed for nausea/vomiting.  Over-the-Counter Meds:  Senna - this is a mild laxative used to treat mild constipation. May take 1-4 tabs by mouth twice a day (total of 8 tablets in a 24 hour period) as needed for mild constipation.  Milk of Magnesia - this is a laxative used to treat moderate to severe constipation. May take 2-4 tablespoons every 8 hours as needed. May increase to 8 tablespoons x 1 dose and if no bowel movement call the [facility]. Take 1 capful (17 grams) daily. This will help you move your bowels. (take if needed for constipation)     SYMPTOMS TO REPORT AS SOON AS POSSIBLE AFTER TREATMENT:  FEVER GREATER THAN 100.5 F  CHILLS WITH OR WITHOUT FEVER  NAUSEA AND VOMITING THAT IS NOT CONTROLLED WITH YOUR NAUSEA MEDICATION  UNUSUAL SHORTNESS OF BREATH  UNUSUAL BRUISING OR BLEEDING  TENDERNESS IN MOUTH AND THROAT WITH OR WITHOUT PRESENCE OF ULCERS  URINARY PROBLEMS  BOWEL PROBLEMS  UNUSUAL RASH    Wear comfortable clothing and clothing appropriate for easy access to any Portacath or PICC line. Let us know if there is anything that we can do to make your therapy better!      I have been informed and understand all of the instructions given to me and have received a copy. I have been instructed to call the clinic [phone] or my family physician as soon as possible for continued medical care, if indicated. I do not have any more questions at this time but understand that I may call the [facility] or the Patient Navigator at [phone] during office hours should I have questions or need assistance in obtaining follow-up care.           Bendamustine Injection What is this medicine? BENDAMUSTINE (BEN da MUS teen) is a chemotherapy drug. It is used to treat chronic lymphocytic leukemia and non-Hodgkin lymphoma. This medicine may be used for other purposes; ask your health care provider or  pharmacist if you have questions. COMMON BRAND NAME(S): Treanda What should I tell my health care provider before I take this medicine? They need to know if you have any of these conditions: -kidney disease -liver disease -an unusual or allergic reaction to bendamustine, mannitol, other medicines, foods, dyes, or preservatives -pregnant or trying to get pregnant -breast-feeding How should I use this medicine? This medicine is for infusion into a vein. It is given by a health care professional in a hospital or clinic setting. Talk to your pediatrician regarding the use of this medicine in children. Special care may be needed. Overdosage: If you think you have taken too much of this medicine contact a poison control center or emergency room at once. NOTE: This medicine is only for you. Do not share this medicine with others. What if I miss a dose? It is important not to miss your dose. Call your doctor or health care professional if you are unable to keep an appointment. What may interact with this medicine? Do not  take this medicine with any of the following medications: -clozapine This medicine may also interact with the following medications: -atazanavir -cimetidine -ciprofloxacin -enoxacin -fluvoxamine -medicines for seizures like carbamazepine and phenobarbital -mexiletine -rifampin -tacrine -thiabendazole -zileuton This list may not describe all possible interactions. Give your health care provider a list of all the medicines, herbs, non-prescription drugs, or dietary supplements you use. Also tell them if you smoke, drink alcohol, or use illegal drugs. Some items may interact with your medicine. What should I watch for while using this medicine? Your condition will be monitored carefully while you are receiving this medicine. This drug may make you feel generally unwell. This is not uncommon, as chemotherapy can affect healthy cells as well as cancer cells. Report any side  effects. Continue your course of treatment even though you feel ill unless your doctor tells you to stop. Call your doctor or health care professional for advice if you get a fever, chills or sore throat, or other symptoms of a cold or flu. Do not treat yourself. This drug decreases your body's ability to fight infections. Try to avoid being around people who are sick. This medicine may increase your risk to bruise or bleed. Call your doctor or health care professional if you notice any unusual bleeding. Be careful brushing and flossing your teeth or using a toothpick because you may get an infection or bleed more easily. If you have any dental work done, tell your dentist you are receiving this medicine. Avoid taking products that contain aspirin, acetaminophen, ibuprofen, naproxen, or ketoprofen unless instructed by your doctor. These medicines may hide a fever. Do not become pregnant while taking this medicine. Women should inform their doctor if they wish to become pregnant or think they might be pregnant. There is a potential for serious side effects to an unborn child. Men should inform their doctors if they wish to father a child. This medicine may lower sperm counts. Talk to your health care professional or pharmacist for more information. Do not breast-feed an infant while taking this medicine. What side effects may I notice from receiving this medicine? Side effects that you should report to your doctor or health care professional as soon as possible: -allergic reactions like skin rash, itching or hives, swelling of the face, lips, or tongue -low blood counts - this medicine may decrease the number of white blood cells, red blood cells and platelets. You may be at increased risk for infections and bleeding. -signs of infection - fever or chills, cough, sore throat, pain or difficulty passing urine -signs of decreased platelets or bleeding - bruising, pinpoint red spots on the skin, black, tarry  stools, blood in the urine -signs of decreased red blood cells - unusually weak or tired, fainting spells, lightheadedness -trouble passing urine or change in the amount of urine Side effects that usually do not require medical attention (report to your doctor or health care professional if they continue or are bothersome): -diarrhea This list may not describe all possible side effects. Call your doctor for medical advice about side effects. You may report side effects to FDA at 1-800-FDA-1088. Where should I keep my medicine? This drug is given in a hospital or clinic and will not be stored at home. NOTE: This sheet is a summary. It may not cover all possible information. If you have questions about this medicine, talk to your doctor, pharmacist, or health care provider.  2015, Else[employer] Standard. (2011-06-06 14:15:47) Acyclovir tablets or capsules What is this medicine?  ACYCLOVIR (ay SYE kloe veer) is an antiviral medicine. It is used to treat or prevent infections caused by certain kinds of viruses. Examples of these infections include herpes and shingles. This medicine will not cure herpes. This medicine may be used for other purposes; ask your health care provider or pharmacist if you have questions. COMMON BRAND NAME(S): Zovirax What should I tell my health care provider before I take this medicine? They need to know if you have any of these conditions: -kidney disease -an unusual or allergic reaction to acyclovir, ganciclovir, valacyclovir, other medicines, foods, dyes, or preservatives -pregnant or trying to get pregnant -breast-feeding How should I use this medicine? Take this medicine by mouth with a glass of water. Follow the directions on the prescription label. You can take it with or without food. Take your medicine at regular intervals. Do not take your medicine more often than directed. Take all of your medicine as directed even if you think your are better. Do not skip  doses or stop your medicine early. Talk to your pediatrician regarding the use of this medicine in children. While this drug may be prescribed for selected conditions, precautions do apply. Overdosage: If you think you have taken too much of this medicine contact a poison control center or emergency room at once. NOTE: This medicine is only for you. Do not share this medicine with others. What if I miss a dose? If you miss a dose, take it as soon as you can. If it is almost time for your next dose, take only that dose. Do not take double or extra doses. What may interact with this medicine? -probenecid This list may not describe all possible interactions. Give your health care provider a list of all the medicines, herbs, non-prescription drugs, or dietary supplements you use. Also tell them if you smoke, drink alcohol, or use illegal drugs. Some items may interact with your medicine. What should I watch for while using this medicine? Tell your doctor or health care professional if your symptoms do not improve. This medicine works best when started very early in the course of an infection. Begin treatment at the first signs of infection. Drink 6 to 8 glasses of water or fluids every day while you are taking this medicine. This will help prevent side effects. You can still pass chickenpox, shingles, or herpes to another person even while you are taking this medicine. Avoid contact with others as directed. Genital herpes is a sexually transmitted disease. Talk to your doctor about how to stop the spread of infection. What side effects may I notice from receiving this medicine? Side effects that you should report to your doctor or health care professional as soon as possible: -allergic reactions like skin rash, itching or hives, swelling of the face, lips, or tongue -chest pain -confusion, hallucinations, tremor -dark urine -increased sensitivity to the sun -redness, blistering, peeling or loosening  of the skin, including inside the mouth -seizures -trouble passing urine or change in the amount of urine -unusual bleeding or bruising, or pinpoint red spots on the skin -unusually weak or tired -yellowing of the eyes or skin Side effects that usually do not require medical attention (report to your doctor or health care professional if they continue or are bothersome): -diarrhea -fever -headache -nausea, vomiting -stomach upset This list may not describe all possible side effects. Call your doctor for medical advice about side effects. You may report side effects to FDA at 1-800-FDA-1088. Where should I keep my  medicine? Keep out of the reach of children. Store at room temperature between 15 and 25 degrees C (59 and 77 degrees F). Throw away any unused medicine after the expiration date. NOTE: This sheet is a summary. It may not cover all possible information. If you have questions about this medicine, talk to your doctor, pharmacist, or health care provider.  2015, Else[employer]/Gold Standard. (2007-05-26 13:15:46) Ondansetron tablets What is this medicine? ONDANSETRON (on DAN se tron) is used to treat nausea and vomiting caused by chemotherapy. It is also used to prevent or treat nausea and vomiting after surgery. This medicine may be used for other purposes; ask your health care provider or pharmacist if you have questions. COMMON BRAND NAME(S): Zofran What should I tell my health care provider before I take this medicine? They need to know if you have any of these conditions: -heart disease -history of irregular heartbeat -liver disease -low levels of magnesium or potassium in the blood -an unusual or allergic reaction to ondansetron, granisetron, other medicines, foods, dyes, or preservatives -pregnant or trying to get pregnant -breast-feeding How should I use this medicine? Take this medicine by mouth with a glass of water. Follow the directions on your prescription label. Take  your doses at regular intervals. Do not take your medicine more often than directed. Talk to your pediatrician regarding the use of this medicine in children. Special care may be needed. Overdosage: If you think you have taken too much of this medicine contact a poison control center or emergency room at once. NOTE: This medicine is only for you. Do not share this medicine with others. What if I miss a dose? If you miss a dose, take it as soon as you can. If it is almost time for your next dose, take only that dose. Do not take double or extra doses. What may interact with this medicine? Do not take this medicine with any of the following medications: -apomorphine -certain medicines for fungal infections like fluconazole, itraconazole, ketoconazole, posaconazole, voriconazole -cisapride -dofetilide -dronedarone -pimozide -thioridazine -ziprasidone This medicine may also interact with the following medications: -carbamazepine -certain medicines for depression, anxiety, or psychotic disturbances -fentanyl -linezolid -MAOIs like Carbex, Eldepryl, Marplan, Nardil, and Parnate -methylene blue (injected into a vein) -other medicines that prolong the QT interval (cause an abnormal heart rhythm) -phenytoin -rifampicin -tramadol This list may not describe all possible interactions. Give your health care provider a list of all the medicines, herbs, non-prescription drugs, or dietary supplements you use. Also tell them if you smoke, drink alcohol, or use illegal drugs. Some items may interact with your medicine. What should I watch for while using this medicine? Check with your doctor or health care professional right away if you have any sign of an allergic reaction. What side effects may I notice from receiving this medicine? Side effects that you should report to your doctor or health care professional as soon as possible: -allergic reactions like skin rash, itching or hives, swelling of the  face, lips or tongue -breathing problems -confusion -dizziness -fast or irregular heartbeat -feeling faint or lightheaded, falls -fever and chills -loss of balance or coordination -seizures -sweating -swelling of the hands or feet -tightness in the chest -tremors -unusually weak or tired Side effects that usually do not require medical attention (report to your doctor or health care professional if they continue or are bothersome): -constipation or diarrhea -headache This list may not describe all possible side effects. Call your doctor for medical advice about side effects.  You may report side effects to FDA at 1-800-FDA-1088. Where should I keep my medicine? Keep out of the reach of children. Store between 2 and 30 degrees C (36 and 86 degrees F). Throw away any unused medicine after the expiration date. NOTE: This sheet is a summary. It may not cover all possible information. If you have questions about this medicine, talk to your doctor, pharmacist, or health care provider.  2015, [employer]/Gold Standard. (2012-12-15 16:27:45) Dexamethasone injection What is this medicine? DEXAMETHASONE (dex a METH a sone) is a corticosteroid. It is used to treat inflammation of the skin, joints, lungs, and other organs. Common conditions treated include asthma, allergies, and arthritis. It is also used for other conditions, like blood disorders and diseases of the adrenal glands. This medicine may be used for other purposes; ask your health care provider or pharmacist if you have questions. COMMON BRAND NAME(S): Decadron, Solurex What should I tell my health care provider before I take this medicine? They need to know if you have any of these conditions: -blood clotting problems -Cushing's syndrome -diabetes -glaucoma -heart problems or disease -high blood pressure -infection like herpes, measles, tuberculosis, or chickenpox -kidney disease -liver disease -mental problems -myasthenia  gravis -osteoporosis -previous heart attack -seizures -stomach, ulcer or intestine disease including colitis and diverticulitis -thyroid problem -an unusual or allergic reaction to dexamethasone, corticosteroids, other medicines, lactose, foods, dyes, or preservatives -pregnant or trying to get pregnant -breast-feeding How should I use this medicine? This medicine is for injection into a muscle, joint, lesion, soft tissue, or vein. It is given by a health care professional in a hospital or clinic setting. Talk to your pediatrician regarding the use of this medicine in children. Special care may be needed. Overdosage: If you think you have taken too much of this medicine contact a poison control center or emergency room at once. NOTE: This medicine is only for you. Do not share this medicine with others. What if I miss a dose? This may not apply. If you are having a series of injections over a prolonged period, try not to miss an appointment. Call your doctor or health care professional to reschedule if you are unable to keep an appointment. What may interact with this medicine? Do not take this medicine with any of the following medications: -mifepristone, RU-486 -vaccines This medicine may also interact with the following medications: -amphotericin B -antibiotics like clarithromycin, erythromycin, and troleandomycin -aspirin and aspirin-like drugs -barbiturates like phenobarbital -carbamazepine -cholestyramine -cholinesterase inhibitors like donepezil, galantamine, rivastigmine, and tacrine -cyclosporine -digoxin -diuretics -ephedrine -male hormones, like estrogens or progestins and birth control pills -indinavir -isoniazid -ketoconazole -medicines for diabetes -medicines that improve muscle tone or strength for conditions like myasthenia gravis -NSAIDs, medicines for pain and inflammation, like ibuprofen or naproxen -phenytoin -rifampin -thalidomide -warfarin This list  may not describe all possible interactions. Give your health care provider a list of all the medicines, herbs, non-prescription drugs, or dietary supplements you use. Also tell them if you smoke, drink alcohol, or use illegal drugs. Some items may interact with your medicine. What should I watch for while using this medicine? Your condition will be monitored carefully while you are receiving this medicine. If you are taking this medicine for a long time, carry an identification card with your name and address, the type and dose of your medicine, and your doctor's name and address. This medicine may increase your risk of getting an infection. Stay away from people who are sick. Tell your doctor or  health care professional if you are around anyone with measles or chickenpox. Talk to your health care provider before you get any vaccines that you take this medicine. If you are going to have surgery, tell your doctor or health care professional that you have taken this medicine within the last twelve months. Ask your doctor or health care professional about your diet. You may need to lower the amount of salt you eat. The medicine can increase your blood sugar. If you are a diabetic check with your doctor if you need help adjusting the dose of your diabetic medicine. What side effects may I notice from receiving this medicine? Side effects that you should report to your doctor or health care professional as soon as possible: -allergic reactions like skin rash, itching or hives, swelling of the face, lips, or tongue -black or tarry stools -change in the amount of urine -changes in vision -confusion, excitement, restlessness, a false sense of well-being -fever, sore throat, sneezing, cough, or other signs of infection, wounds that will not heal -hallucinations -increased thirst -mental depression, mood swings, mistaken feelings of self importance or of being mistreated -pain in hips, back, ribs, arms,  shoulders, or legs -pain, redness, or irritation at the injection site -redness, blistering, peeling or loosening of the skin, including inside the mouth -rounding out of face -swelling of feet or lower legs -unusual bleeding or bruising -unusual tired or weak -wounds that do not heal Side effects that usually do not require medical attention (report to your doctor or health care professional if they continue or are bothersome): -diarrhea or constipation -change in taste -headache -nausea, vomiting -skin problems, acne, thin and shiny skin -touble sleeping -unusual growth of hair on the face or body -weight gain This list may not describe all possible side effects. Call your doctor for medical advice about side effects. You may report side effects to FDA at 1-800-FDA-1088. Where should I keep my medicine? This drug is given in a hospital or clinic and will not be stored at home. NOTE: This sheet is a summary. It may not cover all possible information. If you have questions about this medicine, talk to your doctor, pharmacist, or health care provider.  2015, Elsevier/Gold Standard. (2007-07-01 14:04:12) Ondansetron injection What is this medicine? ONDANSETRON (on DAN se tron) is used to treat nausea and vomiting caused by chemotherapy. It is also used to prevent or treat nausea and vomiting after surgery. This medicine may be used for other purposes; ask your health care provider or pharmacist if you have questions. COMMON BRAND NAME(S): Zofran What should I tell my health care provider before I take this medicine? They need to know if you have any of these conditions: -heart disease -history of irregular heartbeat -liver disease -low levels of magnesium or potassium in the blood -an unusual or allergic reaction to ondansetron, granisetron, other medicines, foods, dyes, or preservatives -pregnant or trying to get pregnant -breast-feeding How should I use this medicine? This  medicine is for infusion into a vein. It is given by a health care professional in a hospital or clinic setting. Talk to your pediatrician regarding the use of this medicine in children. Special care may be needed. Overdosage: If you think you have taken too much of this medicine contact a poison control center or emergency room at once. NOTE: This medicine is only for you. Do not share this medicine with others. What if I miss a dose? This does not apply. What may interact with this  medicine? Do not take this medicine with any of the following medications: -apomorphine -certain medicines for fungal infections like fluconazole, itraconazole, ketoconazole, posaconazole, voriconazole -cisapride -dofetilide -dronedarone -pimozide -thioridazine -ziprasidone This medicine may also interact with the following medications: -carbamazepine -certain medicines for depression, anxiety, or psychotic disturbances -fentanyl -linezolid -MAOIs like Carbex, Eldepryl, Marplan, Nardil, and Parnate -methylene blue (injected into a vein) -other medicines that prolong the QT interval (cause an abnormal heart rhythm) -phenytoin -rifampicin -tramadol This list may not describe all possible interactions. Give your health care provider a list of all the medicines, herbs, non-prescription drugs, or dietary supplements you use. Also tell them if you smoke, drink alcohol, or use illegal drugs. Some items may interact with your medicine. What should I watch for while using this medicine? Your condition will be monitored carefully while you are receiving this medicine. What side effects may I notice from receiving this medicine? Side effects that you should report to your doctor or health care professional as soon as possible: -allergic reactions like skin rash, itching or hives, swelling of the face, lips, or tongue -breathing problems -confusion -dizziness -fast or irregular heartbeat -feeling faint or  lightheaded, falls -fever and chills -loss of balance or coordination -seizures -sweating -swelling of the hands and feet -tightness in the chest -tremors -unusually weak or tired Side effects that usually do not require medical attention (report to your doctor or health care professional if they continue or are bothersome): -constipation or diarrhea -headache This list may not describe all possible side effects. Call your doctor for medical advice about side effects. You may report side effects to FDA at 1-800-FDA-1088. Where should I keep my medicine? This drug is given in a hospital or clinic and will not be stored at home. NOTE: This sheet is a summary. It may not cover all possible information. If you have questions about this medicine, talk to your doctor, pharmacist, or health care provider.  2015, Elsevier/Gold Standard. (2012-12-15 16:18:28)

## 2014-03-28 NOTE — Patient Instructions (Signed)
[place] Discharge Instructions  RECOMMENDATIONS MADE BY THE CONSULTANT AND ANY TEST RESULTS WILL BE SENT TO YOUR REFERRING PHYSICIAN.  We will plan on starting your chemotherapy with Treanda, this is the same medication you received before. We discussed placing a PICC line for your chemotherapy. We will ask your home health to help with maintaining your PICC I will see you back one week after your treatment to see how you did. DO NOT forget to call with problems or concerns prior to your follow-up visit.  Thank you for choosing [facility] to provide your oncology and hematology care.  To afford each patient quality time with our providers, please arrive at least 15 minutes before your scheduled appointment time.  With your help, our goal is to use those 15 minutes to complete the necessary work-up to ensure our physicians have the information they need to help with your evaluation and healthcare recommendations.    Effective January 1st, 2014, we ask that you re-schedule your appointment with our physicians should you arrive 10 or more minutes late for your appointment.  We strive to give you quality time with our providers, and arriving late affects you and other patients whose appointments are after yours.    Again, thank you for choosing [facility].  Our hope is that these requests will decrease the amount of time that you wait before being seen by our physicians.       _____________________________________________________________  Should you have questions after your visit to [facility], please contact our office at [phone] between the hours of 8:30 a.m. and 5:00 p.m.  Voicemails left after 4:30 p.m. will not be returned until the following business day.  For prescription refill requests, have your pharmacy contact our office with your prescription refill request.

## 2014-03-28 NOTE — Telephone Encounter (Signed)
Per [facility], order for PICC line placement with reason for placement and diagnosis be written and faxed to Short Stay, [phone]).  PICC line nurse will contact patient in the AM and will probably be able to place the line tomorrow. Patient does not need to be off xarelto for the procedure. Dr. Whitney Muse notified, orders written and faxed to [facility] Stay.  Patient and family notified to expect a call in the AM.

## 2014-03-30 ENCOUNTER — Encounter (HOSPITAL_COMMUNITY): Payer: Medicare Other

## 2014-03-30 ENCOUNTER — Encounter (HOSPITAL_BASED_OUTPATIENT_CLINIC_OR_DEPARTMENT_OTHER): Payer: Medicare Other

## 2014-03-30 ENCOUNTER — Ambulatory Visit (HOSPITAL_COMMUNITY)
Admission: RE | Admit: 2014-03-30 | Discharge: 2014-03-30 | Disposition: A | Discharge status: Home / Self Care | Payer: Medicare Other | Source: Ambulatory Visit | Attending: Hematology & Oncology | Admitting: Hematology & Oncology

## 2014-03-30 ENCOUNTER — Encounter (HOSPITAL_COMMUNITY): Payer: Self-pay

## 2014-03-30 ENCOUNTER — Inpatient Hospital Stay (HOSPITAL_COMMUNITY): Payer: Medicare Other

## 2014-03-30 DIAGNOSIS — I517 Cardiomegaly: Secondary | ICD-10-CM | POA: Insufficient documentation

## 2014-03-30 DIAGNOSIS — Z452 Encounter for adjustment and management of vascular access device: Secondary | ICD-10-CM | POA: Diagnosis present

## 2014-03-30 DIAGNOSIS — C8581 Other specified types of non-Hodgkin lymphoma, lymph nodes of head, face, and neck: Secondary | ICD-10-CM

## 2014-03-30 DIAGNOSIS — Z5111 Encounter for antineoplastic chemotherapy: Secondary | ICD-10-CM

## 2014-03-30 DIAGNOSIS — C8290 Follicular lymphoma, unspecified, unspecified site: Secondary | ICD-10-CM

## 2014-03-30 DIAGNOSIS — C8331 Diffuse large B-cell lymphoma, lymph nodes of head, face, and neck: Secondary | ICD-10-CM

## 2014-03-30 LAB — COMPREHENSIVE METABOLIC PANEL
ALT: 14 U/L (ref 0–53)
ANION GAP: 6 (ref 5–15)
AST: 21 U/L (ref 0–37)
Albumin: 3.6 g/dL (ref 3.5–5.2)
Alkaline Phosphatase: 102 U/L (ref 39–117)
BUN: 29 mg/dL — ABNORMAL HIGH (ref 6–23)
CALCIUM: 9.2 mg/dL (ref 8.4–10.5)
CO2: 35 mmol/L — ABNORMAL HIGH (ref 19–32)
Chloride: 97 mEq/L (ref 96–112)
Creatinine, Ser: 1.33 mg/dL (ref 0.50–1.35)
GFR calc Af Amer: 59 mL/min — ABNORMAL LOW (ref >90)
GFR calc non Af Amer: 51 mL/min — ABNORMAL LOW (ref >90)
Glucose, Bld: 110 mg/dL — ABNORMAL HIGH (ref 70–99)
Potassium: 3.8 mmol/L (ref 3.5–5.1)
SODIUM: 138 mmol/L (ref 135–145)
Total Bilirubin: 0.9 mg/dL (ref 0.3–1.2)
Total Protein: 7 g/dL (ref 6.0–8.3)

## 2014-03-30 LAB — CBC WITH DIFFERENTIAL/PLATELET
Basophils Absolute: 0.1 10*3/uL (ref 0.0–0.1)
Basophils Relative: 2 % — ABNORMAL HIGH (ref 0–1)
Eosinophils Absolute: 0.3 10*3/uL (ref 0.0–0.7)
Eosinophils Relative: 6 % — ABNORMAL HIGH (ref 0–5)
HCT: 30.7 % — ABNORMAL LOW (ref 39.0–52.0)
HEMOGLOBIN: 9.8 g/dL — AB (ref 13.0–17.0)
LYMPHS ABS: 0.6 10*3/uL — AB (ref 0.7–4.0)
LYMPHS PCT: 11 % — AB (ref 12–46)
MCH: 28.7 pg (ref 26.0–34.0)
MCHC: 31.9 g/dL (ref 30.0–36.0)
MCV: 89.8 fL (ref 78.0–100.0)
MONOS PCT: 8 % (ref 3–12)
Monocytes Absolute: 0.4 10*3/uL (ref 0.1–1.0)
NEUTROS PCT: 73 % (ref 43–77)
Neutro Abs: 3.8 10*3/uL (ref 1.7–7.7)
PLATELETS: 190 10*3/uL (ref 150–400)
RBC: 3.42 MIL/uL — ABNORMAL LOW (ref 4.22–5.81)
RDW: 16.4 % — ABNORMAL HIGH (ref 11.5–15.5)
WBC: 5.1 10*3/uL (ref 4.0–10.5)

## 2014-03-30 LAB — LACTATE DEHYDROGENASE: LDH: 190 U/L (ref 94–250)

## 2014-03-30 MED ORDER — HEPARIN SOD (PORK) LOCK FLUSH 100 UNIT/ML IV SOLN
INTRAVENOUS | Status: AC
  Filled 2014-03-30: qty 5

## 2014-03-30 MED ORDER — DEXAMETHASONE SODIUM PHOSPHATE 10 MG/ML IJ SOLN
INTRAMUSCULAR | Status: AC
  Filled 2014-03-30: qty 1

## 2014-03-30 MED ORDER — SODIUM CHLORIDE 0.9 % IV SOLN
Freq: Once | INTRAVENOUS | Status: AC
  Administered 2014-03-30: 12:00:00 via INTRAVENOUS

## 2014-03-30 MED ORDER — SODIUM CHLORIDE 0.9 % IV SOLN
10.0000 mg | Freq: Once | INTRAVENOUS | Status: AC
  Administered 2014-03-30: 10 mg via INTRAVENOUS
  Filled 2014-03-30: qty 1

## 2014-03-30 MED ORDER — SODIUM CHLORIDE 0.9 % IV SOLN
70.0000 mg/m2 | Freq: Once | INTRAVENOUS | Status: AC
  Administered 2014-03-30: 135 mg via INTRAVENOUS
  Filled 2014-03-30: qty 1.5

## 2014-03-30 MED ORDER — DEXAMETHASONE SODIUM PHOSPHATE 10 MG/ML IJ SOLN: 10.0000 mg | Freq: Once | INTRAMUSCULAR | Status: DC

## 2014-03-30 MED ORDER — SODIUM CHLORIDE 0.9 % IJ SOLN: 10.0000 mL | INTRAMUSCULAR | Status: DC | PRN

## 2014-03-30 MED ORDER — SODIUM CHLORIDE 0.9 % IJ SOLN: 10.0000 mL | Freq: Two times a day (BID) | INTRAMUSCULAR | Status: DC

## 2014-03-30 MED ORDER — HEPARIN SOD (PORK) LOCK FLUSH 100 UNIT/ML IV SOLN
250.0000 [IU] | Freq: Once | INTRAVENOUS | Status: AC | PRN
  Administered 2014-03-30: 250 [IU]

## 2014-03-30 NOTE — Progress Notes (Signed)
Kerrin Champagne Tolerated chemotherapy well today, discharged via wheelchair  Pt had taken zofran PO this am before arriving to hospital to have chemotherapy.

## 2014-03-30 NOTE — Progress Notes (Signed)
Peripherally Inserted Central Catheter/Midline Placement  The IV Nurse has discussed with the patient and/or persons authorized to consent for the patient, the purpose of this procedure and the potential benefits and risks involved with this procedure.  The benefits include less needle sticks, lab draws from the catheter and patient may be discharged home with the catheter.  Risks include, but not limited to, infection, bleeding, blood clot (thrombus formation), and puncture of an artery; nerve damage and irregular heat beat.  Alternatives to this procedure were also discussed.  PICC/Midline Placement Documentation  PICC / Midline Single Lumen 48/88/91 PICC Right Basilic 41 cm 0 cm (Active)  Indication for Insertion or Continuance of Line Vasoactive infusions 03/30/2014 10:00 AM  Exposed Catheter (cm) 0 cm 03/30/2014 10:00 AM  Site Assessment Clean;Dry;Intact 03/30/2014 10:00 AM  Line Status Flushed;Saline locked;Capped (central line);Blood return noted 03/30/2014 10:00 AM  Dressing Type Transparent;Securing device 03/30/2014 10:00 AM  Dressing Status Clean;Dry;Intact;Antimicrobial disc in place 03/30/2014 10:00 AM  Line Care Connections checked and tightened 03/30/2014 10:00 AM  Dressing Intervention New dressing 03/30/2014 10:00 AM  Dressing Change Due 04/06/14 03/30/2014 10:00 AM       Hillery Jacks 03/30/2014, 11:01 AM

## 2014-03-30 NOTE — Patient Instructions (Signed)
[facility] Discharge Instructions for Patients Receiving Chemotherapy  Today you received the following chemotherapy agents bendamustine Please call the clinic if you have any questions or concerns.  You will return tomorrow for 2nd day of bendamustine  To help prevent nausea and vomiting after your treatment, we encourage you to take your nausea medication    If you develop nausea and vomiting that is not controlled by your nausea medication, call the clinic. If it is after clinic hours your family physician or the after hours number for the clinic or go to the Emergency Department.   BELOW ARE SYMPTOMS THAT SHOULD BE REPORTED IMMEDIATELY:  *FEVER GREATER THAN 101.0 F  *CHILLS WITH OR WITHOUT FEVER  NAUSEA AND VOMITING THAT IS NOT CONTROLLED WITH YOUR NAUSEA MEDICATION  *UNUSUAL SHORTNESS OF BREATH  *UNUSUAL BRUISING OR BLEEDING  TENDERNESS IN MOUTH AND THROAT WITH OR WITHOUT PRESENCE OF ULCERS  *URINARY PROBLEMS  *BOWEL PROBLEMS  UNUSUAL RASH Items with * indicate a potential emergency and should be followed up as soon as possible.  One of the nurses will contact you 24 hours after your treatment. Please let the nurse know about any problems that you may have experienced. Feel free to call the clinic you have any questions or concerns. The clinic phone number is [phone].   I have been informed and understand all the instructions given to me. I know to contact the clinic, my physician, or go to the Emergency Department if any problems should occur. I do not have any questions at this time, but understand that I may call the clinic during office hours or the Patient Navigator at [phone] should I have any questions or need assistance in obtaining follow up care.    Bendamustine Injection What is this medicine? BENDAMUSTINE (BEN da MUS teen) is a chemotherapy drug. It is used to treat chronic lymphocytic leukemia and non-Hodgkin lymphoma. This  medicine may be used for other purposes; ask your health care provider or pharmacist if you have questions. COMMON BRAND NAME(S): Treanda What should I tell my health care provider before I take this medicine? They need to know if you have any of these conditions: -kidney disease -liver disease -an unusual or allergic reaction to bendamustine, mannitol, other medicines, foods, dyes, or preservatives -pregnant or trying to get pregnant -breast-feeding How should I use this medicine? This medicine is for infusion into a vein. It is given by a health care professional in a hospital or clinic setting. Talk to your pediatrician regarding the use of this medicine in children. Special care may be needed. Overdosage: If you think you have taken too much of this medicine contact a poison control center or emergency room at once. NOTE: This medicine is only for you. Do not share this medicine with others. What if I miss a dose? It is important not to miss your dose. Call your doctor or health care professional if you are unable to keep an appointment. What may interact with this medicine? Do not take this medicine with any of the following medications: -clozapine This medicine may also interact with the following medications: -atazanavir -cimetidine -ciprofloxacin -enoxacin -fluvoxamine -medicines for seizures like carbamazepine and phenobarbital -mexiletine -rifampin -tacrine -thiabendazole -zileuton This list may not describe all possible interactions. Give your health care provider a list of all the medicines, herbs, non-prescription drugs, or dietary supplements you use. Also tell them if you smoke, drink alcohol, or use illegal drugs. Some items may interact with  your medicine. What should I watch for while using this medicine? Your condition will be monitored carefully while you are receiving this medicine. This drug may make you feel generally unwell. This is not uncommon, as  chemotherapy can affect healthy cells as well as cancer cells. Report any side effects. Continue your course of treatment even though you feel ill unless your doctor tells you to stop. Call your doctor or health care professional for advice if you get a fever, chills or sore throat, or other symptoms of a cold or flu. Do not treat yourself. This drug decreases your body's ability to fight infections. Try to avoid being around people who are sick. This medicine may increase your risk to bruise or bleed. Call your doctor or health care professional if you notice any unusual bleeding. Be careful brushing and flossing your teeth or using a toothpick because you may get an infection or bleed more easily. If you have any dental work done, tell your dentist you are receiving this medicine. Avoid taking products that contain aspirin, acetaminophen, ibuprofen, naproxen, or ketoprofen unless instructed by your doctor. These medicines may hide a fever. Do not become pregnant while taking this medicine. Women should inform their doctor if they wish to become pregnant or think they might be pregnant. There is a potential for serious side effects to an unborn child. Men should inform their doctors if they wish to father a child. This medicine may lower sperm counts. Talk to your health care professional or pharmacist for more information. Do not breast-feed an infant while taking this medicine. What side effects may I notice from receiving this medicine? Side effects that you should report to your doctor or health care professional as soon as possible: -allergic reactions like skin rash, itching or hives, swelling of the face, lips, or tongue -low blood counts - this medicine may decrease the number of white blood cells, red blood cells and platelets. You may be at increased risk for infections and bleeding. -signs of infection - fever or chills, cough, sore throat, pain or difficulty passing urine -signs of decreased  platelets or bleeding - bruising, pinpoint red spots on the skin, black, tarry stools, blood in the urine -signs of decreased red blood cells - unusually weak or tired, fainting spells, lightheadedness -trouble passing urine or change in the amount of urine Side effects that usually do not require medical attention (report to your doctor or health care professional if they continue or are bothersome): -diarrhea This list may not describe all possible side effects. Call your doctor for medical advice about side effects. You may report side effects to FDA at 1-800-FDA-1088. Where should I keep my medicine? This drug is given in a hospital or clinic and will not be stored at home. NOTE: This sheet is a summary. It may not cover all possible information. If you have questions about this medicine, talk to your doctor, pharmacist, or health care provider.  2015, Elsevier/Gold Standard. (2011-06-06 14:15:47)

## 2014-03-30 NOTE — Progress Notes (Signed)
Chemo teaching done and consent signed for Bendamustine. Calendar given to wife.

## 2014-03-30 NOTE — Discharge Instructions (Signed)
PICC Home Guide A peripherally inserted central catheter (PICC) is a long, thin, flexible tube that is inserted into a vein in the upper arm. It is a form of intravenous (IV) access. It is considered to be a "central" line because the tip of the PICC ends in a large vein in your chest. This large vein is called the superior vena cava (SVC). The PICC tip ends in the SVC because there is a lot of blood flow in the SVC. This allows medicines and IV fluids to be quickly distributed throughout the body. The PICC is inserted using a sterile technique by a specially trained nurse or physician. After the PICC is inserted, a chest X-ray exam is done to be sure it is in the correct place.  A PICC may be placed for different reasons, such as:  To give medicines and liquid nutrition that can only be given through a central line. Examples are:  Certain antibiotic treatments.  Chemotherapy.  Total parenteral nutrition (TPN).  To take frequent blood samples.  To give IV fluids and blood products.  If there is difficulty placing a peripheral intravenous (PIV) catheter. If taken care of properly, a PICC can remain in place for several months. A PICC can also allow a person to go home from the hospital early. Medicine and PICC care can be managed at home by a family member or home health care team. WHAT PROBLEMS CAN HAPPEN WHEN I HAVE A PICC? Problems with a PICC can occasionally occur. These may include the following:  A blood clot (thrombus) forming in or at the tip of the PICC. This can cause the PICC to become clogged. A clot-dissolving medicine called tissue plasminogen activator (tPA) can be given through the PICC to help break up the clot.  Inflammation of the vein (phlebitis) in which the PICC is placed. Signs of inflammation may include redness, pain at the insertion site, red streaks, or being able to feel a "cord" in the vein where the PICC is located.  Infection in the PICC or at the insertion  site. Signs of infection may include fever, chills, redness, swelling, or pus drainage from the PICC insertion site.  PICC movement (malposition). The PICC tip may move from its original position due to excessive physical activity, forceful coughing, sneezing, or vomiting.  A break or cut in the PICC. It is important to not use scissors near the PICC.  Nerve or tendon irritation or injury during PICC insertion. WHAT SHOULD I KEEP IN MIND ABOUT ACTIVITIES WHEN I HAVE A PICC?  You may bend your arm and move it freely. If your PICC is near or at the bend of your elbow, avoid activity with repeated motion at the elbow.  Rest at home for the remainder of the day following PICC line insertion.  Avoid lifting heavy objects as instructed by your health care provider.  Avoid using a crutch with the arm on the same side as your PICC. You may need to use a walker. WHAT SHOULD I KNOW ABOUT MY PICC DRESSING?  Keep your PICC bandage (dressing) clean and dry to prevent infection.  Ask your health care provider when you may shower. Ask your health care provider to teach you how to wrap the PICC when you do take a shower.  Change the PICC dressing as instructed by your health care provider.  Change your PICC dressing if it becomes loose or wet. WHAT SHOULD I KNOW ABOUT PICC CARE?  Check the PICC insertion site   daily for leakage, redness, swelling, or pain.  Do not take a bath, swim, or use hot tubs when you have a PICC. Cover PICC line with clear plastic wrap and tape to keep it dry while showering.  Flush the PICC as directed by your health care provider. Let your health care provider know right away if the PICC is difficult to flush or does not flush. Do not use force to flush the PICC.  Do not use a syringe that is less than 10 mL to flush the PICC.  Never pull or tug on the PICC.  Avoid blood pressure checks on the arm with the PICC.  Keep your PICC identification card with you at all  times.  Do not take the PICC out yourself. Only a trained clinical professional should remove the PICC. SEEK IMMEDIATE MEDICAL CARE IF:  Your PICC is accidentally pulled all the way out. If this happens, cover the insertion site with a bandage or gauze dressing. Do not throw the PICC away. Your health care provider will need to inspect it.  Your PICC was tugged or pulled and has partially come out. Do not  push the PICC back in.  There is any type of drainage, redness, or swelling where the PICC enters the skin.  You cannot flush the PICC, it is difficult to flush, or the PICC leaks around the insertion site when it is flushed.  You hear a "flushing" sound when the PICC is flushed.  You have pain, discomfort, or numbness in your arm, shoulder, or jaw on the same side as the PICC.  You feel your heart "racing" or skipping beats.  You notice a hole or tear in the PICC.  You develop chills or a fever. MAKE SURE YOU:   Understand these instructions.  Will watch your condition.  Will get help right away if you are not doing well or get worse. Document Released: 09/14/2002 Document Revised: 07/25/2013 Document Reviewed: 11/15/2012 ExitCare Patient Information 2015 [employer]. This information is not intended to replace advice given to you by your health care provider. Make sure you discuss any questions you have with your health care provider.  

## 2014-03-31 ENCOUNTER — Encounter (HOSPITAL_BASED_OUTPATIENT_CLINIC_OR_DEPARTMENT_OTHER): Payer: Medicare Other

## 2014-03-31 ENCOUNTER — Inpatient Hospital Stay (HOSPITAL_COMMUNITY): Payer: Medicare Other

## 2014-03-31 DIAGNOSIS — C8581 Other specified types of non-Hodgkin lymphoma, lymph nodes of head, face, and neck: Secondary | ICD-10-CM

## 2014-03-31 DIAGNOSIS — Z5111 Encounter for antineoplastic chemotherapy: Secondary | ICD-10-CM

## 2014-03-31 DIAGNOSIS — C8290 Follicular lymphoma, unspecified, unspecified site: Secondary | ICD-10-CM

## 2014-03-31 MED ORDER — SODIUM CHLORIDE 0.9 % IJ SOLN
10.0000 mL | INTRAMUSCULAR | Status: DC | PRN
  Administered 2014-03-31: 10 mL
  Filled 2014-03-31: qty 10

## 2014-03-31 MED ORDER — SODIUM CHLORIDE 0.9 % IV SOLN
Freq: Once | INTRAVENOUS | Status: AC
  Administered 2014-03-31: 12:00:00 via INTRAVENOUS

## 2014-03-31 MED ORDER — DEXAMETHASONE SODIUM PHOSPHATE 10 MG/ML IJ SOLN: 10.0000 mg | Freq: Once | INTRAMUSCULAR | Status: DC

## 2014-03-31 MED ORDER — HEPARIN SOD (PORK) LOCK FLUSH 100 UNIT/ML IV SOLN
INTRAVENOUS | Status: AC
  Filled 2014-03-31: qty 5

## 2014-03-31 MED ORDER — SODIUM CHLORIDE 0.9 % IV SOLN
Freq: Once | INTRAVENOUS | Status: AC
  Administered 2014-03-31: 8 mg via INTRAVENOUS
  Filled 2014-03-31: qty 4

## 2014-03-31 MED ORDER — SODIUM CHLORIDE 0.9 % IV SOLN
70.0000 mg/m2 | Freq: Once | INTRAVENOUS | Status: AC
  Administered 2014-03-31: 135 mg via INTRAVENOUS
  Filled 2014-03-31: qty 1.5

## 2014-03-31 MED ORDER — HEPARIN SOD (PORK) LOCK FLUSH 100 UNIT/ML IV SOLN
250.0000 [IU] | Freq: Once | INTRAVENOUS | Status: AC | PRN
  Administered 2014-03-31: 250 [IU]

## 2014-03-31 MED ORDER — SODIUM CHLORIDE 0.9 % IV SOLN: 8.0000 mg | Freq: Once | INTRAVENOUS | Status: DC

## 2014-03-31 NOTE — Patient Instructions (Signed)
[facility] Discharge Instructions for Patients Receiving Chemotherapy  Today you received the following chemotherapy agents Bendamustine Cycle 1 Day 2.  To help prevent nausea and vomiting after your treatment, we encourage you to take your nausea medication as instructed.   If you develop nausea and vomiting that is not controlled by your nausea medication, call the clinic. If it is after clinic hours your family physician or the after hours number for the clinic or go to the Emergency Department.   BELOW ARE SYMPTOMS THAT SHOULD BE REPORTED IMMEDIATELY:  *FEVER GREATER THAN 101.0 F  *CHILLS WITH OR WITHOUT FEVER  NAUSEA AND VOMITING THAT IS NOT CONTROLLED WITH YOUR NAUSEA MEDICATION  *UNUSUAL SHORTNESS OF BREATH  *UNUSUAL BRUISING OR BLEEDING  TENDERNESS IN MOUTH AND THROAT WITH OR WITHOUT PRESENCE OF ULCERS  *URINARY PROBLEMS  *BOWEL PROBLEMS  UNUSUAL RASH Items with * indicate a potential emergency and should be followed up as soon as possible.  I have been informed and understand all the instructions given to me. I know to contact the clinic, my physician, or go to the Emergency Department if any problems should occur. I do not have any questions at this time, but understand that I may call the clinic during office hours or the Patient Navigator at [phone] should I have any questions or need assistance in obtaining follow up care.    __________________________________________  _____________  __________ Signature of Patient or Authorized Representative            Date                   Time    __________________________________________ Nurse's Signature

## 2014-03-31 NOTE — Progress Notes (Signed)
No complaints voiced from chemotherapy treatment yesterday. Tolerated chemo well today.

## 2014-04-04 ENCOUNTER — Encounter (HOSPITAL_BASED_OUTPATIENT_CLINIC_OR_DEPARTMENT_OTHER): Payer: Medicare Other | Admitting: Oncology

## 2014-04-04 VITALS — BP 102/79 | HR 52 | Temp 98.3°F | Resp 18

## 2014-04-04 DIAGNOSIS — C8331 Diffuse large B-cell lymphoma, lymph nodes of head, face, and neck: Secondary | ICD-10-CM

## 2014-04-04 DIAGNOSIS — C8581 Other specified types of non-Hodgkin lymphoma, lymph nodes of head, face, and neck: Secondary | ICD-10-CM

## 2014-04-04 DIAGNOSIS — K5909 Other constipation: Secondary | ICD-10-CM

## 2014-04-04 NOTE — Patient Instructions (Signed)
[place] at [facility]  Discharge Instructions:  We will refer you to PT to help get you back on your feet. We will continue with chemotherapy as planned Return as scheduled for follow-up Continue with Senna-S daily and consider taking MiraLax daily to keep bowels moving.  Consider take 1/2 capful of Miralax daily and adjust as needed.  _______________________________________________________________  Thank you for choosing [place] at [facility] to provide your oncology and hematology care.  To afford each patient quality time with our providers, please arrive at least 15 minutes before your scheduled appointment.  You need to re-schedule your appointment if you arrive 10 or more minutes late.  We strive to give you quality time with our providers, and arriving late affects you and other patients whose appointments are after yours.  Also, if you no show three or more times for appointments you may be dismissed from the clinic.  Again, thank you for choosing [place] at [place] hope is that these requests will allow you access to exceptional care and in a timely manner. _______________________________________________________________  If you have questions after your visit, please contact our office at [phone] between the hours of 8:30 a.m. and 5:00 p.m. Voicemails left after 4:30 p.m. will not be returned until the following business day. _______________________________________________________________  For prescription refill requests, have your pharmacy contact our office. _______________________________________________________________  Recommendations made by the consultant and any test results will be sent to your referring physician. _______________________________________________________________

## 2014-04-04 NOTE — Progress Notes (Signed)
Charles Bellow, MD [address] [place] [address]  No diagnosis found.  CURRENT THERAPY: Bendamustine-single agent beginning on 03/30/2014  INTERVAL HISTORY: Charles [name] 74 y.o. male returns for followup of DLBCL.      Follicular lymphoma   09/18/3660 Imaging CT soft tissue of neck- Adenopathy more notable on right neck than left.   06/07/2010 Initial Diagnosis FNA of right neck mass concerning for lymphoma.   06/24/2010 Procedure Excisional lymph node biopsy- HU76 NHL, follicular type, grade III.   07/09/2010 Bone Marrow Biopsy Bone marrow aspiration and biopsy- rare minute atypical lymphoid aggregates, cellular marrow with complete trilineage hematopoiesis, no blasts found on peripheral blood   07/11/2010 Imaging PET- confluent right cervical lymphadenopathy with a foci within the skeleton and dominant left iliac lesions   07/17/2010 - 11/05/2010 Chemotherapy Bendamustine + Rituxan x 6 cycles   09/12/2010 Remission PET- Interval complete metabolic response to therapy    11/20/2010 Imaging PET- No evidence of residual or recurrent hypermetabolic lymphoma.    02/17/2011 - 12/09/2011 Chemotherapy Rituxan every 90 days maintenance x 4.  D/C'd due to pseudomonas bacteremia   06/06/2011 Imaging PET- No specific features identified to suggest residual or recurrence of tumor.   01/30/2012 Adverse Reaction Pseudomonas bacteremia   06/22/2012 Imaging PET- No findings to suggest lymphomatous involvement in the neck, chest, abdomen, or pelvis.   07/06/2013 Imaging CT CAP- Stable mediastinal and hilar lymph nodes. No findings for lymphoma in the abdomen or pelvis.   11/14/2013 Imaging CT soft tissues of the neck revealed bulky right cervical lymphadenopathy with stable mediastinal and hilar disease.   12/16/2013 Surgery fine-needle aspirate right neck nondiagnostic.   01/31/2014 Surgery Biopsy right cervical lymph node by Dr.Teoh consistent with diffuse large B-cell lymphoma.      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 Open biopsy of deep R neck mass by Leta Baptist, MD   02/21/2014 PET scan Hypermetabolic lymphoma in the R neck involving stations 2 - 4. Progressive cardiac enlargement suspicious for cardiomyopathy   03/29/2014 -  Chemotherapy Bendamustine on days 1 and 2 every 28 days   I personally reviewed and went over laboratory results with the patient.  The results are noted within this dictation.  His biggest complaint was an episode of constipation.  He finally had a large soft BM yesterday, 04/03/2014 after using Senna-S, MiraLax, and mineral oil.  We spent time discussing a bowel regimen.  I have encouraged continued use of Sennokot-S daily.  He may want to consider taking MiraLax daily, maybe 1/2 capful daily and adjust as needed.   He reports he otherwise tolerated his first treatment well. He notes that his hands got cold for a few days.  This has resolved.  He reports a 1 day history of "jitteriness."  That too has resolved and was likely secondary to steroids.  He notes that he is feeling better.  He is sleeping well.  He notes that he thinks his right neck node has decreased.  I've recommend PT for ambulation.  He has some knee issues, which may be the culprit, but he does admit to some increased weakness over the past months.  He is agreeable to PT.  Hematologically, he denies any complaints and ROS questioning is negative.  Past Medical History  Diagnosis Date  . Essential hypertension, benign   . Atrial fibrillation 10/2008  . Nonischemic  cardiomyopathy 02/2009    LVEF improved from 25-30% up to 50-55%, Nonobstructive CAD  . Peptic ulcer disease   . Degenerative joint disease     Knees and shoulders  . Pneumonia 2010  . Allergic rhinitis   . Anxiety   . Drug abuse   . Tobacco abuse     50 pack years  . Mitral regurgitation     Moderate to severe May 2014  . COPD (chronic  obstructive pulmonary disease)   . Hernia, inguinal, left   . Arm fracture, right 07/2013  . Hernia, inguinal, left   . Shortness of breath   . Dysrhythmia   . CHF (congestive heart failure)   . Follicular lymphoma 06/27/2701  . 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     is allergic to metoprolol and statins.  Charles [name] does not currently have medications on file.  Past Surgical History  Procedure Laterality Date  . Neck lesion biopsy  June 24, 5007    Follicular lymphoma  . Skin cancer excision      under left breast; variously described as melanoma and basal cell  . Rotator cuff repair      right  . Patella fracture surgery Left 1975  . Hematoma evacuation      after melanoma removal  . Portacath placement  07/12/2010  . Colonoscopy  2009    Also underwent an EGD; patient reports no significant findings  . Bone marrow aspiration  06/2012  . Bone marrow biopsy  06/2012  . Fungal infection    . Port-a-cath removal Left 01/17/2013    Procedure: REMOVAL PORT-A-CATH;  Surgeon: Scherry Ran, MD;  Location: [facility];  Service: General;  Laterality: Left;  . Inguinal hernia repair Left 12/16/2013    Procedure: HERNIA REPAIR INGUINAL ADULT;  Surgeon: Scherry Ran, MD;  Location: [facility];  Service: General;  Laterality: Left;  Marland Kitchen Mass biopsy Right 12/16/2013    Procedure: FINE NEEDLE ASPIRATION NECK MASS;  Surgeon:  Scherry Ran, MD;  Location: [facility];  Service: General;  Laterality: Right;  . Mass biopsy Right 01/31/2014    Procedure: INCISIONAL BIOPSY OF RIGHT NECK MASS ;  Surgeon: Ascencion Dike, MD;  Location: [facility];  Service: ENT;  Laterality: Right;    Denies any headaches, dizziness, double vision, fevers, chills, night sweats, nausea, vomiting, diarrhea, constipation, chest pain, heart palpitations, shortness of breath, blood in stool, black tarry stool, urinary pain, urinary burning, urinary frequency, hematuria.   PHYSICAL EXAMINATION  ECOG PERFORMANCE STATUS: 3 - Symptomatic, >50% confined to bed  Filed Vitals:   04/04/14 1200  BP: 102/79  Pulse: 52  Temp: 98.3 F (36.8 C)  Resp: 18    GENERAL:alert, no distress, well nourished, well developed, comfortable, cooperative, smiling and in wheelchair SKIN: skin color, texture, turgor are normal, no rashes or significant lesions HEAD: Normocephalic, No masses, lesions, tenderness or abnormalities EYES: normal, PERRLA, EOMI, Conjunctiva are pink and non-injected EARS: External ears normal OROPHARYNX:lips, buccal mucosa, and tongue normal and mucous membranes are moist  NECK: supple, right  neck lymph node measuring 6 cm craniocaudally and 4 cm anterior-posteriorly.  LYMPH:  As above and right small supraclavicular nodes measuring less than 0.5 cm. BREAST:not examined LUNGS: clear to auscultation  HEART: regular rate & rhythm ABDOMEN:abdomen soft and normal bowel sounds BACK: Back symmetric, no curvature. EXTREMITIES:less then 2 second capillary refill, no joint deformities, effusion, or inflammation, no skin discoloration, no cyanosis  NEURO: alert & oriented x 3 with fluent speech, no focal motor/sensory deficits, in wheelchair   LABORATORY DATA: CBC    Component Value Date/Time   WBC 5.1 03/30/2014 1101   RBC 3.42* 03/30/2014 1101   RBC 4.07* 11/08/2013 1530   HGB 9.8* 03/30/2014 1101   HCT 30.7* 03/30/2014  1101   PLT 190 03/30/2014 1101   MCV 89.8 03/30/2014 1101   MCH 28.7 03/30/2014 1101   MCHC 31.9 03/30/2014 1101   RDW 16.4* 03/30/2014 1101   LYMPHSABS 0.6* 03/30/2014 1101   MONOABS 0.4 03/30/2014 1101   EOSABS 0.3 03/30/2014 1101   BASOSABS 0.1 03/30/2014 1101      Chemistry      Component Value Date/Time   NA 138 03/30/2014 1101   K 3.8 03/30/2014 1101   CL 97 03/30/2014 1101   CO2 35* 03/30/2014 1101   BUN 29* 03/30/2014 1101   CREATININE 1.33 03/30/2014 1101   CREATININE 1.68* 10/30/2012 1112      Component Value Date/Time   CALCIUM 9.2 03/30/2014 1101   ALKPHOS 102 03/30/2014 1101   AST 21 03/30/2014 1101   ALT 14 03/30/2014 1101   BILITOT 0.9 03/30/2014 1101       RADIOGRAPHIC STUDIES:  Dg Chest Port 1 View  03/30/2014   CLINICAL DATA:  PICC line placement  EXAM: PORTABLE CHEST - 1 VIEW  COMPARISON:  Chest x-ray of 01/18/2014  FINDINGS: The tip of the right PICC line overlies the mid lower SVC. No pneumothorax is seen. The lungs are clear. Cardiomegaly is stable. Right humeral head prosthesis is noted.  IMPRESSION: Right PICC line tip overlies the mid lower SVC.  No pneumothorax.   Electronically Signed   By: Ivar Drape M.D.   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  Tolerated well.  Patient notices a clinical response since therapy.  Continue with cycle 2 of chemotherapy as planned. Referral to PT for strength training to increase ambulation possibly.    Constipation Recommend bowel regimen consisting of Sennokot-S and MiraLax.     THERAPY PLAN:  We will move forward with chemotherapy as planned.   All questions were answered. The patient knows to call the clinic with any problems, questions or concerns. We can certainly see the patient much sooner if necessary.  Patient and plan discussed with Dr. Ancil Linsey and she is in agreement with the aforementioned.    Charles [name] 04/04/2014

## 2014-04-04 NOTE — Assessment & Plan Note (Signed)
S/P cycle 1 of Bendamustine-single agent.  Tolerated well.  Patient notices a clinical response since therapy.  Continue with cycle 2 of chemotherapy as planned. Referral to PT for strength training to increase ambulation possibly.

## 2014-04-04 NOTE — Assessment & Plan Note (Signed)
Recommend bowel regimen consisting of Sennokot-S and MiraLax.

## 2014-04-05 ENCOUNTER — Encounter: Payer: Self-pay | Admitting: *Deleted

## 2014-04-05 NOTE — Progress Notes (Signed)
[facility] Psychosocial Distress Screening Clinical Social Work  Clinical Social Work was referred by distress screening protocol.  The patient scored a 5 on the Psychosocial Distress Thermometer which indicates moderate distress. Clinical Social Worker phoned pt and wife to assess for distress and other psychosocial needs. Per wife, their biggest concern is the ability to pay their medical bills on a very fixed income. CSW discussed the possibility of co-pay assistance programs and will make referral to [facility] for further assistance. Transportation is not currently a concern, but getting him in and out of the car is becoming a concern. They report to possibly need a ramp, but have no ability to afford one currently. CSW discussed possible resources to assist and least expensive options for ramp. Wife agrees to inquire with [facility] to see if they are aware of resources to assist as well. They are providing RN once a week to flush picc and change bandages. They have provided a CNA 3x a week along with other needed DME. Wife is interested in possible support through [facility] for ramp assistance. CSW to make referral, wife aware she will need to supply proof of need/income to Nicanor Bake at [employer]. Pt feels his depression and anxiety are currently under control. He has medication to assist him and feels this works for him. Wife and pt aware of how to reach CSW as needed and has contacts.   ONCBCN DISTRESS SCREENING 03/30/2014  Screening Type Initial Screening  Distress experienced in past week (1-10) 5  Practical problem type Insurance  Physical Problem type Constipation/diarrhea  Referral to clinical social work Yes     Clinical Social Worker follow up needed: Yes.    If yes, follow up plan: Referrals to [facility] and [place], [place] Tuesdays 8:30-1pm Wednesdays 8:30-12pm  [phone]

## 2014-04-08 NOTE — Assessment & Plan Note (Signed)
74 year old male who is on hospice for heart failure. He has been diagnosed with a stage I versus stage II diffuse large cell lymphoma of the right neck. Tumor is CD20 negative; he has had prior exposure to Rituxan. He has no B symptoms. IPI score is a 3. He was presented at hematology tumor Board this week. The patient and his family desire treatment and we discussed single agent Treanda. He has had Treanda in the past and had good tolerance. He is much clearer today; at our first visit he had taken both morning and afternoon doses of medications and had quite a lot of fatigue. He does not wish to have a port put back in and we discussed PICC line placement which he agrees to. I advised he and his family we will take his treatment from week to week and if he is doing well the goal will be for 3 cycles. We will be able to gauge response by the size of the right neck lesion. He may be able to tolerate consolidative radiation after 3 cycles of Treanda.

## 2014-04-11 ENCOUNTER — Other Ambulatory Visit (HOSPITAL_COMMUNITY): Payer: Self-pay | Admitting: Oncology

## 2014-04-11 DIAGNOSIS — C8581 Other specified types of non-Hodgkin lymphoma, lymph nodes of head, face, and neck: Secondary | ICD-10-CM

## 2014-04-27 ENCOUNTER — Encounter (HOSPITAL_COMMUNITY): Payer: Self-pay | Admitting: Lab

## 2014-04-27 ENCOUNTER — Encounter (HOSPITAL_BASED_OUTPATIENT_CLINIC_OR_DEPARTMENT_OTHER): Payer: Medicare Other | Admitting: Hematology & Oncology

## 2014-04-27 ENCOUNTER — Encounter (HOSPITAL_COMMUNITY): Payer: Self-pay | Admitting: Hematology & Oncology

## 2014-04-27 ENCOUNTER — Encounter (HOSPITAL_COMMUNITY): Payer: Medicare Other | Attending: Oncology

## 2014-04-27 DIAGNOSIS — C8290 Follicular lymphoma, unspecified, unspecified site: Secondary | ICD-10-CM | POA: Diagnosis present

## 2014-04-27 DIAGNOSIS — D630 Anemia in neoplastic disease: Secondary | ICD-10-CM

## 2014-04-27 DIAGNOSIS — C8331 Diffuse large B-cell lymphoma, lymph nodes of head, face, and neck: Secondary | ICD-10-CM | POA: Diagnosis not present

## 2014-04-27 DIAGNOSIS — C8581 Other specified types of non-Hodgkin lymphoma, lymph nodes of head, face, and neck: Secondary | ICD-10-CM

## 2014-04-27 DIAGNOSIS — Z5111 Encounter for antineoplastic chemotherapy: Secondary | ICD-10-CM | POA: Diagnosis present

## 2014-04-27 LAB — COMPREHENSIVE METABOLIC PANEL
ALBUMIN: 3.2 g/dL — AB (ref 3.5–5.2)
ALT: 13 U/L (ref 0–53)
ANION GAP: 5 (ref 5–15)
AST: 24 U/L (ref 0–37)
Alkaline Phosphatase: 85 U/L (ref 39–117)
BUN: 27 mg/dL — ABNORMAL HIGH (ref 6–23)
CO2: 32 mmol/L (ref 19–32)
CREATININE: 1.3 mg/dL (ref 0.50–1.35)
Calcium: 8.3 mg/dL — ABNORMAL LOW (ref 8.4–10.5)
Chloride: 102 mmol/L (ref 96–112)
GFR calc Af Amer: 61 mL/min — ABNORMAL LOW (ref >90)
GFR calc non Af Amer: 52 mL/min — ABNORMAL LOW (ref >90)
GLUCOSE: 119 mg/dL — AB (ref 70–99)
Potassium: 3.7 mmol/L (ref 3.5–5.1)
Sodium: 139 mmol/L (ref 135–145)
Total Bilirubin: 0.8 mg/dL (ref 0.3–1.2)
Total Protein: 6.7 g/dL (ref 6.0–8.3)

## 2014-04-27 LAB — LACTATE DEHYDROGENASE: LDH: 198 U/L (ref 94–250)

## 2014-04-27 LAB — CBC WITH DIFFERENTIAL/PLATELET
BASOS ABS: 0.1 10*3/uL (ref 0.0–0.1)
BASOS PCT: 2 % — AB (ref 0–1)
EOS PCT: 10 % — AB (ref 0–5)
Eosinophils Absolute: 0.4 10*3/uL (ref 0.0–0.7)
HCT: 25.4 % — ABNORMAL LOW (ref 39.0–52.0)
HEMOGLOBIN: 8.1 g/dL — AB (ref 13.0–17.0)
LYMPHS ABS: 0.3 10*3/uL — AB (ref 0.7–4.0)
LYMPHS PCT: 7 % — AB (ref 12–46)
MCH: 28.5 pg (ref 26.0–34.0)
MCHC: 31.9 g/dL (ref 30.0–36.0)
MCV: 89.4 fL (ref 78.0–100.0)
MONOS PCT: 7 % (ref 3–12)
Monocytes Absolute: 0.3 10*3/uL (ref 0.1–1.0)
Neutro Abs: 3.1 10*3/uL (ref 1.7–7.7)
Neutrophils Relative %: 74 % (ref 43–77)
PLATELETS: 171 10*3/uL (ref 150–400)
RBC: 2.84 MIL/uL — ABNORMAL LOW (ref 4.22–5.81)
RDW: 18.4 % — AB (ref 11.5–15.5)
WBC: 4.2 10*3/uL (ref 4.0–10.5)

## 2014-04-27 LAB — PREPARE RBC (CROSSMATCH)

## 2014-04-27 MED ORDER — SODIUM CHLORIDE 0.9 % IV SOLN: 8.0000 mg | Freq: Once | INTRAVENOUS | Status: DC

## 2014-04-27 MED ORDER — ONDANSETRON HCL 40 MG/20ML IJ SOLN
Freq: Once | INTRAMUSCULAR | Status: AC
  Administered 2014-04-27: 8 mg via INTRAVENOUS
  Filled 2014-04-27: qty 4

## 2014-04-27 MED ORDER — SODIUM CHLORIDE 0.9 % IV SOLN
Freq: Once | INTRAVENOUS | Status: AC
  Administered 2014-04-27: 14:00:00 via INTRAVENOUS

## 2014-04-27 MED ORDER — SODIUM CHLORIDE 0.9 % IJ SOLN
10.0000 mL | INTRAMUSCULAR | Status: DC | PRN
  Administered 2014-04-27: 10 mL
  Filled 2014-04-27: qty 10

## 2014-04-27 MED ORDER — DEXAMETHASONE SODIUM PHOSPHATE 10 MG/ML IJ SOLN: 10.0000 mg | Freq: Once | INTRAMUSCULAR | Status: DC

## 2014-04-27 MED ORDER — BENDAMUSTINE HCL CHEMO INJECTION 180 MG/2ML
70.0000 mg/m2 | Freq: Once | INTRAVENOUS | Status: AC
  Administered 2014-04-27: 135 mg via INTRAVENOUS
  Filled 2014-04-27: qty 1.5

## 2014-04-27 MED ORDER — HEPARIN SOD (PORK) LOCK FLUSH 100 UNIT/ML IV SOLN
500.0000 [IU] | Freq: Once | INTRAVENOUS | Status: AC | PRN
  Administered 2014-04-27: 500 [IU]
  Filled 2014-04-27: qty 5

## 2014-04-27 NOTE — Progress Notes (Signed)
Charles Bellow, MD [address] [place] [address]  DIFFUSE LARGE B CELL LYMPHOMA R NECK, STAGE II     Follicular lymphoma   06/30/1446 Imaging CT soft tissue of neck- Adenopathy more notable on right neck than left.   06/07/2010 Initial Diagnosis FNA of right neck mass concerning for lymphoma.   06/24/2010 Procedure Excisional lymph node biopsy- JE56 NHL, follicular type, grade III.   07/09/2010 Bone Marrow Biopsy Bone marrow aspiration and biopsy- rare minute atypical lymphoid aggregates, cellular marrow with complete trilineage hematopoiesis, no blasts found on peripheral blood   07/11/2010 Imaging PET- confluent right cervical lymphadenopathy with a foci within the skeleton and dominant left iliac lesions   07/17/2010 - 11/05/2010 Chemotherapy Bendamustine + Rituxan x 6 cycles   09/12/2010 Remission PET- Interval complete metabolic response to therapy    11/20/2010 Imaging PET- No evidence of residual or recurrent hypermetabolic lymphoma.    02/17/2011 - 12/09/2011 Chemotherapy Rituxan every 90 days maintenance x 4.  D/C'd due to pseudomonas bacteremia   06/06/2011 Imaging PET- No specific features identified to suggest residual or recurrence of tumor.   01/30/2012 Adverse Reaction Pseudomonas bacteremia   06/22/2012 Imaging PET- No findings to suggest lymphomatous involvement in the neck, chest, abdomen, or pelvis.   07/06/2013 Imaging CT CAP- Stable mediastinal and hilar lymph nodes. No findings for lymphoma in the abdomen or pelvis.   11/14/2013 Imaging CT soft tissues of the neck revealed bulky right cervical lymphadenopathy with stable mediastinal and hilar disease.   12/16/2013 Surgery fine-needle aspirate right neck nondiagnostic.   01/31/2014 Surgery Biopsy right cervical lymph node by Dr.Teoh consistent with diffuse large B-cell lymphoma.    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 CD 20 NEGATIVE  Open biopsy of deep R neck mass by Leta Baptist, MD   02/21/2014 PET scan Hypermetabolic lymphoma in the R neck involving stations 2 - 4. Progressive cardiac enlargement suspicious for cardiomyopathy   03/29/2014 -  Chemotherapy Bendamustine on days 1 and 2 every 28 days   CURRENT THERAPY: Single agent Treanda  INTERVAL HISTORY: Charles [name] 74 y.o. male returns for follow-up of his DLBCL. He is here today for cycle 3 of Treanda. He has done much better than anticipated with therapy. His only complaint today is worsening of his lower extremity swelling which is chronic. He currently takes 160 mg of Lasix twice daily. He is on hospice for his underlying heart failure. The right-sided neck mass is no longer palpable. He denies any night sweats, fever, chills, or significant change in appetite. He reports he actually feels well.   Past Medical History  Diagnosis Date  . Essential hypertension, benign   . Atrial fibrillation 10/2008  . 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 Peptic ulcer disease   . Degenerative joint disease     Knees and shoulders  . Pneumonia 2010  . Allergic rhinitis   . Anxiety   . Drug abuse   . Tobacco abuse     50 pack years  . Mitral regurgitation     Moderate to severe May 2014  . COPD (chronic obstructive pulmonary disease)   . Hernia, inguinal, left   . Arm fracture, right 07/2013  . Hernia, inguinal, left   . Shortness of breath   . Dysrhythmia   . CHF (congestive heart failure)   .  Follicular lymphoma 05/26/3297  . 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       is allergic to metoprolol and statins.  Charles [name] does not currently have medications on file.  Past Surgical History  Procedure Laterality Date  . Neck lesion biopsy  June 23, 2424    Follicular lymphoma  . Skin cancer excision      under left breast; variously described as melanoma and basal cell  . Rotator cuff repair      right  . Patella fracture surgery Left 1975  . Hematoma evacuation      after melanoma removal  . Portacath placement  07/12/2010  . Colonoscopy  2009    Also underwent an EGD; patient reports no significant findings  . Bone marrow aspiration  06/2012  . Bone marrow biopsy  06/2012  . Fungal infection    . Port-a-cath removal Left 01/17/2013    Procedure: REMOVAL PORT-A-CATH;  Surgeon: Scherry Ran, MD;  Location: [facility];  Service: General;  Laterality: Left;  . Inguinal hernia repair Left 12/16/2013    Procedure: HERNIA REPAIR INGUINAL ADULT;  Surgeon: Scherry Ran, MD;  Location: [facility];  Service: General;  Laterality: Left;  Marland Kitchen Mass biopsy Right 12/16/2013    Procedure: FINE NEEDLE ASPIRATION NECK MASS;  Surgeon: Scherry Ran, MD;  Location: [facility];  Service: General;  Laterality: Right;  . Mass biopsy Right 01/31/2014    Procedure: INCISIONAL BIOPSY OF RIGHT NECK MASS ;  Surgeon: Ascencion Dike, MD;  Location: [facility];  Service: ENT;  Laterality: Right;    Review of Systems  Constitutional: Negative.   HENT: Negative.   Eyes: Negative.   Respiratory: Positive for shortness of breath. Negative for cough, hemoptysis, sputum production and wheezing.   Cardiovascular: Positive for leg swelling. Negative for chest pain, palpitations and orthopnea.    Gastrointestinal: Negative.   Genitourinary: Positive for urgency and frequency. Negative for dysuria and hematuria.  Musculoskeletal: Positive for back pain and joint pain.       Non-ambulatory without assistance  Skin: Negative.   Neurological: Negative for tremors, sensory change, speech change, focal weakness, seizures and loss of consciousness.  Endo/Heme/Allergies: Negative.   Psychiatric/Behavioral: Negative.     PHYSICAL EXAMINATION  ECOG PERFORMANCE STATUS: 2 - Symptomatic, <50% confined to bed  There were no vitals filed for this visit.  Physical Exam  Constitutional: He is oriented to person, place, and time and well-developed, well-nourished, and in no distress. No distress.  Patient is lying in a hospital bed  HENT:  Head: Normocephalic and atraumatic.  Mouth/Throat: No oropharyngeal exudate.  Eyes: Conjunctivae and EOM are normal. Pupils are equal, round, and reactive to light. No scleral icterus.  Neck: Normal range of motion. Neck supple. No tracheal deviation present. No thyromegaly present.  R neck mass no longer palpable   Cardiovascular: Normal rate and normal heart sounds.  Exam reveals no gallop.   Pulmonary/Chest: Effort normal.  Wearing O2  Abdominal: Soft. Bowel sounds are normal. He exhibits no mass. There  is no tenderness. There is no rebound and no guarding.  Musculoskeletal: He exhibits edema. He exhibits no tenderness.  Neurological: He is alert and oriented to person, place, and time. No cranial nerve deficit.  Skin: Skin is warm and dry. No erythema.  Psychiatric: Mood normal.    LABORATORY DATA:  CBC    Component Value Date/Time   WBC 4.2 04/27/2014 1129   RBC 2.84* 04/27/2014 1129   RBC 4.07* 11/08/2013 1530   HGB 8.1* 04/27/2014 1129   HCT 25.4* 04/27/2014 1129   PLT 171 04/27/2014 1129   MCV 89.4 04/27/2014 1129   MCH 28.5 04/27/2014 1129   MCHC 31.9 04/27/2014 1129   RDW 18.4* 04/27/2014 1129   LYMPHSABS 0.3* 04/27/2014 1129    MONOABS 0.3 04/27/2014 1129   EOSABS 0.4 04/27/2014 1129   BASOSABS 0.1 04/27/2014 1129   CMP     Component Value Date/Time   NA 139 04/27/2014 1129   K 3.7 04/27/2014 1129   CL 102 04/27/2014 1129   CO2 32 04/27/2014 1129   GLUCOSE 119* 04/27/2014 1129   BUN 27* 04/27/2014 1129   CREATININE 1.30 04/27/2014 1129   CREATININE 1.68* 10/30/2012 1112   CALCIUM 8.3* 04/27/2014 1129   PROT 6.7 04/27/2014 1129   ALBUMIN 3.2* 04/27/2014 1129   AST 24 04/27/2014 1129   ALT 13 04/27/2014 1129   ALKPHOS 85 04/27/2014 1129   BILITOT 0.8 04/27/2014 1129   GFRNONAA 52* 04/27/2014 1129   GFRAA 61* 04/27/2014 1129     ASSESSMENT and THERAPY PLAN:    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 He is currently on hospice for his underlying heart disease. He and his family wished to pursue therapy for his lymphoma. He has done remarkably well with treatment. He is here today for his last cycle of Treanda. His neck mass is no longer palpable. We initially discussed 3 cycles of chemotherapy and if tolerated referral to radiation oncology for consolidative radiotherapy. I do not feel a need to pursue additional imaging at this point, as his clinical exam I feel is sufficient. I will refer him to Dr. Isidore Moos, if she needs additional imaging we can arrange for that. We will see him back in 2 weeks with labs to check on his recovery from therapy and we will move his visits out from there.   Anemia in neoplastic disease He is more anemic today with a hemoglobin of 8.1. I suspect this is treatment related. I have recommended 2 units of packed red cells tomorrow. He is agreeable with transfusion. I will recheck a CBC next week. If counts do not slowly improve over the next several weeks we will do an  additional anemia evaluation.     All questions were answered. The patient knows to call the clinic with any problems, questions or concerns. We can certainly see the patient much sooner if necessary.   Molli Hazard 04/27/2014

## 2014-04-27 NOTE — Patient Instructions (Signed)
[facility] Discharge Instructions for Patients Receiving Chemotherapy  Today you received the following chemotherapy agents Bendamustine Cycle 2 day 1. Return to clinic tomorrow for day 2 as well as a blood transfusion. Be here at 915 am. To help prevent nausea and vomiting after your treatment, we encourage you to take your nausea medication as instructed. If you develop nausea and vomiting that is not controlled by your nausea medication, call the clinic. If it is after clinic hours your family physician or the after hours number for the clinic or go to the Emergency Department.  BELOW ARE SYMPTOMS THAT SHOULD BE REPORTED IMMEDIATELY:  *FEVER GREATER THAN 101.0 F  *CHILLS WITH OR WITHOUT FEVER  NAUSEA AND VOMITING THAT IS NOT CONTROLLED WITH YOUR NAUSEA MEDICATION  *UNUSUAL SHORTNESS OF BREATH  *UNUSUAL BRUISING OR BLEEDING  TENDERNESS IN MOUTH AND THROAT WITH OR WITHOUT PRESENCE OF ULCERS  *URINARY PROBLEMS  *BOWEL PROBLEMS  UNUSUAL RASH Items with * indicate a potential emergency and should be followed up as soon as possible.   I have been informed and understand all the instructions given to me. I know to contact the clinic, my physician, or go to the Emergency Department if any problems should occur. I do not have any questions at this time, but understand that I may call the clinic during office hours or the Patient Navigator at [phone] should I have any questions or need assistance in obtaining follow up care.    __________________________________________  _____________  __________ Signature of Patient or Authorized Representative            Date                   Time    __________________________________________ Nurse's Signature

## 2014-04-27 NOTE — Assessment & Plan Note (Signed)
He is more anemic today with a hemoglobin of 8.1. I suspect this is treatment related. I have recommended 2 units of packed red cells tomorrow. He is agreeable with transfusion. I will recheck a CBC next week. If counts do not slowly improve over the next several weeks we will do an additional anemia evaluation.

## 2014-04-27 NOTE — Assessment & Plan Note (Signed)
74 year old male with CHF and a history of grade 3 follicular lymphoma treated in 2012. 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 He is currently on hospice for his underlying heart disease. He and his family wished to pursue therapy for his lymphoma. He has done remarkably well with treatment. He is here today for his last cycle of Treanda. His neck mass is no longer palpable. We initially discussed 3 cycles of chemotherapy and if tolerated referral to radiation oncology for consolidative radiotherapy. I do not feel a need to pursue additional imaging at this point, as his clinical exam I feel is sufficient. I will refer him to Dr. Isidore Moos, if she needs additional imaging we can arrange for that. We will see him back in 2 weeks with labs to check on his recovery from therapy and we will move his visits out from there.

## 2014-04-27 NOTE — Patient Instructions (Signed)
[facility] Discharge Instructions  RECOMMENDATIONS MADE BY THE CONSULTANT AND ANY TEST RESULTS WILL BE SENT TO YOUR REFERRING PHYSICIAN.  SPECIAL INSTRUCTIONS/FOLLOW-UP:  Please call prior to your next visit with any problems or concerns. We will see you back again in 2 weeks  with labs and an office visit.  Thank you for choosing [facility] to provide your oncology and hematology care.  To afford each patient quality time with our providers, please arrive at least 15 minutes before your scheduled appointment time.  With your help, our goal is to use those 15 minutes to complete the necessary work-up to ensure our physicians have the information they need to help with your evaluation and healthcare recommendations.    Effective January 1st, 2014, we ask that you re-schedule your appointment with our physicians should you arrive 10 or more minutes late for your appointment.  We strive to give you quality time with our providers, and arriving late affects you and other patients whose appointments are after yours.    Again, thank you for choosing [facility].  Our hope is that these requests will decrease the amount of time that you wait before being seen by our physicians.       _____________________________________________________________  Should you have questions after your visit to [facility], please contact our office at [phone] between the hours of 8:30 a.m. and 5:00 p.m.  Voicemails left after 4:30 p.m. will not be returned until the following business day.  For prescription refill requests, have your pharmacy contact our office with your prescription refill request.

## 2014-04-27 NOTE — Progress Notes (Signed)
Referral made to [facility] / Dr Isidore Moos.  Records faxed on 2/4

## 2014-04-27 NOTE — Progress Notes (Signed)
Labs reviewed Per Dr.Penland, ok to treat with chemotherapy today. Tolerated chemo well.

## 2014-04-28 ENCOUNTER — Encounter (HOSPITAL_BASED_OUTPATIENT_CLINIC_OR_DEPARTMENT_OTHER): Payer: Medicare Other

## 2014-04-28 ENCOUNTER — Encounter (HOSPITAL_COMMUNITY): Payer: Medicare Other

## 2014-04-28 DIAGNOSIS — C8331 Diffuse large B-cell lymphoma, lymph nodes of head, face, and neck: Secondary | ICD-10-CM | POA: Diagnosis not present

## 2014-04-28 DIAGNOSIS — Z5111 Encounter for antineoplastic chemotherapy: Secondary | ICD-10-CM | POA: Diagnosis present

## 2014-04-28 DIAGNOSIS — C8581 Other specified types of non-Hodgkin lymphoma, lymph nodes of head, face, and neck: Secondary | ICD-10-CM

## 2014-04-28 DIAGNOSIS — D630 Anemia in neoplastic disease: Secondary | ICD-10-CM

## 2014-04-28 DIAGNOSIS — C8290 Follicular lymphoma, unspecified, unspecified site: Secondary | ICD-10-CM | POA: Diagnosis not present

## 2014-04-28 MED ORDER — SODIUM CHLORIDE 0.9 % IV SOLN
250.0000 mL | Freq: Once | INTRAVENOUS | Status: AC
  Administered 2014-04-28: 250 mL via INTRAVENOUS

## 2014-04-28 MED ORDER — FUROSEMIDE 10 MG/ML IJ SOLN
80.0000 mg | Freq: Once | INTRAMUSCULAR | Status: AC
  Administered 2014-04-28: 80 mg via INTRAVENOUS
  Filled 2014-04-28: qty 8

## 2014-04-28 MED ORDER — ONDANSETRON HCL 40 MG/20ML IJ SOLN
Freq: Once | INTRAMUSCULAR | Status: AC
  Administered 2014-04-28: 8 mg via INTRAVENOUS
  Filled 2014-04-28: qty 4

## 2014-04-28 MED ORDER — HEPARIN SOD (PORK) LOCK FLUSH 100 UNIT/ML IV SOLN
500.0000 [IU] | Freq: Once | INTRAVENOUS | Status: AC | PRN
  Administered 2014-04-28: 500 [IU]
  Filled 2014-04-28: qty 5

## 2014-04-28 MED ORDER — SODIUM CHLORIDE 0.9 % IV SOLN
70.0000 mg/m2 | Freq: Once | INTRAVENOUS | Status: AC
  Administered 2014-04-28: 135 mg via INTRAVENOUS
  Filled 2014-04-28: qty 1.5

## 2014-04-28 MED ORDER — DIPHENHYDRAMINE HCL 25 MG PO CAPS
25.0000 mg | ORAL_CAPSULE | Freq: Once | ORAL | Status: AC
  Administered 2014-04-28: 25 mg via ORAL
  Filled 2014-04-28: qty 1

## 2014-04-28 MED ORDER — SODIUM CHLORIDE 0.9 % IV SOLN: 8.0000 mg | Freq: Once | INTRAVENOUS | Status: DC

## 2014-04-28 MED ORDER — ACETAMINOPHEN 325 MG PO TABS
650.0000 mg | ORAL_TABLET | Freq: Once | ORAL | Status: AC
  Administered 2014-04-28: 650 mg via ORAL
  Filled 2014-04-28: qty 2

## 2014-04-28 MED ORDER — DEXAMETHASONE SODIUM PHOSPHATE 10 MG/ML IJ SOLN: 10.0000 mg | Freq: Once | INTRAMUSCULAR | Status: DC

## 2014-04-28 MED ORDER — SODIUM CHLORIDE 0.9 % IV SOLN
Freq: Once | INTRAVENOUS | Status: AC
  Administered 2014-04-28: 10:00:00 via INTRAVENOUS

## 2014-04-28 MED ORDER — SODIUM CHLORIDE 0.9 % IJ SOLN
10.0000 mL | INTRAMUSCULAR | Status: DC | PRN
  Administered 2014-04-28: 10 mL
  Filled 2014-04-28: qty 10

## 2014-04-28 NOTE — Progress Notes (Signed)
Tolerated chemo well. Tolerated blood transfusion without adverse reaction.

## 2014-04-28 NOTE — Patient Instructions (Signed)
[facility] Discharge Instructions for Patients Receiving Chemotherapy  Today you received the following chemotherapy agents Bendamustine Cycle 2 Day 2. You also received 2 units blood transfusion today followed by Lasix 80 mg IV push.  To help prevent nausea and vomiting after your treatment, we encourage you to take your nausea medication as instructed.   If you develop nausea and vomiting that is not controlled by your nausea medication, call the clinic. If it is after clinic hours your family physician or the after hours number for the clinic or go to the Emergency Department.   BELOW ARE SYMPTOMS THAT SHOULD BE REPORTED IMMEDIATELY:  *FEVER GREATER THAN 101.0 F  *CHILLS WITH OR WITHOUT FEVER  NAUSEA AND VOMITING THAT IS NOT CONTROLLED WITH YOUR NAUSEA MEDICATION  *UNUSUAL SHORTNESS OF BREATH  *UNUSUAL BRUISING OR BLEEDING  TENDERNESS IN MOUTH AND THROAT WITH OR WITHOUT PRESENCE OF ULCERS  *URINARY PROBLEMS  *BOWEL PROBLEMS  UNUSUAL RASH Items with * indicate a potential emergency and should be followed up as soon as possible.  Return as scheduled.  I have been informed and understand all the instructions given to me. I know to contact the clinic, my physician, or go to the Emergency Department if any problems should occur. I do not have any questions at this time, but understand that I may call the clinic during office hours or the Patient Navigator at [phone] should I have any questions or need assistance in obtaining follow up care.    __________________________________________  _____________  __________ Signature of Patient or Authorized Representative            Date                   Time    __________________________________________ Nurse's Signature

## 2014-04-29 LAB — TYPE AND SCREEN
ABO/RH(D): O POS
ANTIBODY SCREEN: NEGATIVE
Unit division: 0
Unit division: 0

## 2014-05-01 ENCOUNTER — Ambulatory Visit (HOSPITAL_COMMUNITY): Payer: Medicare Other | Admitting: Physical Therapy

## 2014-05-02 ENCOUNTER — Telehealth (HOSPITAL_COMMUNITY): Payer: Self-pay | Admitting: Oncology

## 2014-05-02 ENCOUNTER — Emergency Department (HOSPITAL_COMMUNITY)
Admission: EM | Admit: 2014-05-02 | Discharge: 2014-05-02 | Disposition: A | Discharge status: Home / Self Care | Payer: Medicare Other | Attending: Emergency Medicine | Admitting: Emergency Medicine

## 2014-05-02 ENCOUNTER — Encounter (HOSPITAL_COMMUNITY): Payer: Self-pay | Admitting: Emergency Medicine

## 2014-05-02 DIAGNOSIS — Z8701 Personal history of pneumonia (recurrent): Secondary | ICD-10-CM | POA: Insufficient documentation

## 2014-05-02 DIAGNOSIS — R5383 Other fatigue: Secondary | ICD-10-CM | POA: Insufficient documentation

## 2014-05-02 DIAGNOSIS — C8581 Other specified types of non-Hodgkin lymphoma, lymph nodes of head, face, and neck: Secondary | ICD-10-CM

## 2014-05-02 DIAGNOSIS — I4891 Unspecified atrial fibrillation: Secondary | ICD-10-CM | POA: Diagnosis not present

## 2014-05-02 DIAGNOSIS — E876 Hypokalemia: Secondary | ICD-10-CM | POA: Diagnosis not present

## 2014-05-02 DIAGNOSIS — Z8781 Personal history of (healed) traumatic fracture: Secondary | ICD-10-CM | POA: Insufficient documentation

## 2014-05-02 DIAGNOSIS — Z8711 Personal history of peptic ulcer disease: Secondary | ICD-10-CM | POA: Insufficient documentation

## 2014-05-02 DIAGNOSIS — Z8719 Personal history of other diseases of the digestive system: Secondary | ICD-10-CM | POA: Insufficient documentation

## 2014-05-02 DIAGNOSIS — R609 Edema, unspecified: Secondary | ICD-10-CM | POA: Insufficient documentation

## 2014-05-02 DIAGNOSIS — R197 Diarrhea, unspecified: Secondary | ICD-10-CM | POA: Diagnosis not present

## 2014-05-02 DIAGNOSIS — J449 Chronic obstructive pulmonary disease, unspecified: Secondary | ICD-10-CM | POA: Diagnosis not present

## 2014-05-02 DIAGNOSIS — Z7901 Long term (current) use of anticoagulants: Secondary | ICD-10-CM | POA: Insufficient documentation

## 2014-05-02 DIAGNOSIS — Z87891 Personal history of nicotine dependence: Secondary | ICD-10-CM | POA: Insufficient documentation

## 2014-05-02 DIAGNOSIS — C8331 Diffuse large B-cell lymphoma, lymph nodes of head, face, and neck: Secondary | ICD-10-CM | POA: Insufficient documentation

## 2014-05-02 DIAGNOSIS — Z85828 Personal history of other malignant neoplasm of skin: Secondary | ICD-10-CM | POA: Diagnosis not present

## 2014-05-02 DIAGNOSIS — F419 Anxiety disorder, unspecified: Secondary | ICD-10-CM | POA: Diagnosis not present

## 2014-05-02 DIAGNOSIS — Z79899 Other long term (current) drug therapy: Secondary | ICD-10-CM | POA: Insufficient documentation

## 2014-05-02 DIAGNOSIS — R531 Weakness: Secondary | ICD-10-CM | POA: Insufficient documentation

## 2014-05-02 DIAGNOSIS — I509 Heart failure, unspecified: Secondary | ICD-10-CM | POA: Insufficient documentation

## 2014-05-02 DIAGNOSIS — I10 Essential (primary) hypertension: Secondary | ICD-10-CM | POA: Diagnosis not present

## 2014-05-02 LAB — CBC WITH DIFFERENTIAL/PLATELET
Basophils Absolute: 0 10*3/uL (ref 0.0–0.1)
Basophils Relative: 0 % (ref 0–1)
EOS PCT: 7 % — AB (ref 0–5)
Eosinophils Absolute: 0.5 10*3/uL (ref 0.0–0.7)
HCT: 32.7 % — ABNORMAL LOW (ref 39.0–52.0)
HEMOGLOBIN: 10.4 g/dL — AB (ref 13.0–17.0)
LYMPHS ABS: 0.1 10*3/uL — AB (ref 0.7–4.0)
Lymphocytes Relative: 2 % — ABNORMAL LOW (ref 12–46)
MCH: 28.4 pg (ref 26.0–34.0)
MCHC: 31.8 g/dL (ref 30.0–36.0)
MCV: 89.3 fL (ref 78.0–100.0)
MONO ABS: 0.5 10*3/uL (ref 0.1–1.0)
MONOS PCT: 7 % (ref 3–12)
NEUTROS PCT: 84 % — AB (ref 43–77)
Neutro Abs: 6 10*3/uL (ref 1.7–7.7)
PLATELETS: 200 10*3/uL (ref 150–400)
RBC: 3.66 MIL/uL — AB (ref 4.22–5.81)
RDW: 18.1 % — ABNORMAL HIGH (ref 11.5–15.5)
WBC: 7.2 10*3/uL (ref 4.0–10.5)

## 2014-05-02 LAB — COMPREHENSIVE METABOLIC PANEL
ALT: 15 U/L (ref 0–53)
AST: 18 U/L (ref 0–37)
Albumin: 3.3 g/dL — ABNORMAL LOW (ref 3.5–5.2)
Alkaline Phosphatase: 82 U/L (ref 39–117)
Anion gap: 6 (ref 5–15)
BUN: 35 mg/dL — ABNORMAL HIGH (ref 6–23)
CALCIUM: 8.7 mg/dL (ref 8.4–10.5)
CO2: 36 mmol/L — ABNORMAL HIGH (ref 19–32)
Chloride: 96 mmol/L (ref 96–112)
Creatinine, Ser: 1.1 mg/dL (ref 0.50–1.35)
GFR calc Af Amer: 74 mL/min — ABNORMAL LOW (ref >90)
GFR calc non Af Amer: 64 mL/min — ABNORMAL LOW (ref >90)
Glucose, Bld: 132 mg/dL — ABNORMAL HIGH (ref 70–99)
Potassium: 3.1 mmol/L — ABNORMAL LOW (ref 3.5–5.1)
SODIUM: 138 mmol/L (ref 135–145)
TOTAL PROTEIN: 6.6 g/dL (ref 6.0–8.3)
Total Bilirubin: 1.2 mg/dL (ref 0.3–1.2)

## 2014-05-02 LAB — URINALYSIS, ROUTINE W REFLEX MICROSCOPIC
Bilirubin Urine: NEGATIVE
Glucose, UA: NEGATIVE mg/dL
Hgb urine dipstick: NEGATIVE
Ketones, ur: NEGATIVE mg/dL
LEUKOCYTES UA: NEGATIVE
NITRITE: NEGATIVE
PROTEIN: NEGATIVE mg/dL
Specific Gravity, Urine: 1.01 (ref 1.005–1.030)
Urobilinogen, UA: 4 mg/dL — ABNORMAL HIGH (ref 0.0–1.0)
pH: 6 (ref 5.0–8.0)

## 2014-05-02 LAB — DIGOXIN LEVEL: Digoxin Level: 1.3 ng/mL (ref 0.8–2.0)

## 2014-05-02 LAB — LIPASE, BLOOD: Lipase: 15 U/L (ref 11–59)

## 2014-05-02 MED ORDER — SODIUM CHLORIDE 0.9 % IV BOLUS (SEPSIS)
500.0000 mL | Freq: Once | INTRAVENOUS | Status: AC
  Administered 2014-05-02: 500 mL via INTRAVENOUS

## 2014-05-02 MED ORDER — MORPHINE SULFATE 10 MG/ML IJ SOLN
10.0000 mg | Freq: Once | INTRAMUSCULAR | Status: AC
  Administered 2014-05-02: 10 mg via INTRAVENOUS
  Filled 2014-05-02: qty 1

## 2014-05-02 MED ORDER — SODIUM CHLORIDE 0.9 % IV BOLUS (SEPSIS)
1000.0000 mL | Freq: Once | INTRAVENOUS | Status: AC
  Administered 2014-05-02: 1000 mL via INTRAVENOUS

## 2014-05-02 MED ORDER — POTASSIUM CHLORIDE CRYS ER 20 MEQ PO TBCR: 20.0000 meq | EXTENDED_RELEASE_TABLET | Freq: Three times a day (TID) | ORAL | Status: AC

## 2014-05-02 MED ORDER — HEPARIN SOD (PORK) LOCK FLUSH 100 UNIT/ML IV SOLN
INTRAVENOUS | Status: AC
  Filled 2014-05-02: qty 5

## 2014-05-02 NOTE — Telephone Encounter (Signed)
05/02/2014 0900 Contacted by Juliann Pulse, RN at Nurse Triage - patient's wife called after hours nurse line to report patient has experienced severe diarrhea since the evening of 04/29/14; multiple loose stools per day, each day since.  I called Opal Sidles, the wife, and she verbalized that Mr. Starnes is now somulent, lethargic, confused and unable to ambulate.  Advised patient's wife to contact 911 so patient can be taken to the Emergency Room.  Dr. Whitney Muse was in agreement.    05/02/2014 0920  Report called to Magda Paganini, RN in the ED to inform of what the patient's wife was advised to do for the patient.  Report also included that patient received chemotherapy on 2/4 and 2/5.  Needs C-diff precautions upon arrival to the ER.

## 2014-05-02 NOTE — ED Notes (Signed)
Pt. Place on c diff precaution due to diarrhea since Saturday.

## 2014-05-02 NOTE — ED Notes (Signed)
Pt reports generalized weakness with diarrhea since his last chemo treatment on Friday. Pt denies vomiting and is able to hold down PO intake.

## 2014-05-02 NOTE — Discharge Instructions (Signed)
Diarrhea Diarrhea is frequent loose and watery bowel movements. It can cause you to feel weak and dehydrated. Dehydration can cause you to become tired and thirsty, have a dry mouth, and have decreased urination that often is dark yellow. Diarrhea is a sign of another problem, most often an infection that will not last long. In most cases, diarrhea typically lasts 2-3 days. However, it can last longer if it is a sign of something more serious. It is important to treat your diarrhea as directed by your caregiver to lessen or prevent future episodes of diarrhea. CAUSES  Some common causes include:  Gastrointestinal infections caused by viruses, bacteria, or parasites.  Food poisoning or food allergies.  Certain medicines, such as antibiotics, chemotherapy, and laxatives.  Artificial sweeteners and fructose.  Digestive disorders. HOME CARE INSTRUCTIONS  Ensure adequate fluid intake (hydration): Have 1 cup (8 oz) of fluid for each diarrhea episode. Avoid fluids that contain simple sugars or sports drinks, fruit juices, whole milk products, and sodas. Your urine should be clear or pale yellow if you are drinking enough fluids. Hydrate with an oral rehydration solution that you can purchase at pharmacies, retail stores, and online. You can prepare an oral rehydration solution at home by mixing the following ingredients together:   - tsp table salt.   tsp baking soda.   tsp salt substitute containing potassium chloride.  1  tablespoons sugar.  1 L (34 oz) of water.  Certain foods and beverages may increase the speed at which food moves through the gastrointestinal (GI) tract. These foods and beverages should be avoided and include:  Caffeinated and alcoholic beverages.  High-fiber foods, such as raw fruits and vegetables, nuts, seeds, and whole grain breads and cereals.  Foods and beverages sweetened with sugar alcohols, such as xylitol, sorbitol, and mannitol.  Some foods may be well  tolerated and may help thicken stool including:  Starchy foods, such as rice, toast, pasta, low-sugar cereal, oatmeal, grits, baked potatoes, crackers, and bagels.  Bananas.  Applesauce.  Add probiotic-rich foods to help increase healthy bacteria in the GI tract, such as yogurt and fermented milk products.  Wash your hands well after each diarrhea episode.  Only take over-the-counter or prescription medicines as directed by your caregiver.  Take a warm bath to relieve any burning or pain from frequent diarrhea episodes. SEEK IMMEDIATE MEDICAL CARE IF:   You are unable to keep fluids down.  You have persistent vomiting.  You have blood in your stool, or your stools are black and tarry.  You do not urinate in 6-8 hours, or there is only a small amount of very dark urine.  You have abdominal pain that increases or localizes.  You have weakness, dizziness, confusion, or light-headedness.  You have a severe headache.  Your diarrhea gets worse or does not get better.  You have a fever or persistent symptoms for more than 2-3 days.  You have a fever and your symptoms suddenly get worse. MAKE SURE YOU:   Understand these instructions.  Will watch your condition.  Will get help right away if you are not doing well or get worse. Document Released: 02/28/2002 Document Revised: 07/25/2013 Document Reviewed: 11/16/2011 [facility] Patient Information 2015 [place], [place]. This information is not intended to replace advice given to you by your health care provider. Make sure you discuss any questions you have with your health care provider.  Hypokalemia Hypokalemia means that the amount of potassium in the blood is lower than normal.Potassium is  a chemical, called an electrolyte, that helps regulate the amount of fluid in the body. It also stimulates muscle contraction and helps nerves function properly.Most of the body's potassium is inside of cells, and only a very small amount is  in the blood. Because the amount in the blood is so small, minor changes can be life-threatening. CAUSES  Antibiotics.  Diarrhea or vomiting.  Using laxatives too much, which can cause diarrhea.  Chronic kidney disease.  Water pills (diuretics).  Eating disorders (bulimia).  Low magnesium level.  Sweating a lot. SIGNS AND SYMPTOMS  Weakness.  Constipation.  Fatigue.  Muscle cramps.  Mental confusion.  Skipped heartbeats or irregular heartbeat (palpitations).  Tingling or numbness. DIAGNOSIS  Your health care provider can diagnose hypokalemia with blood tests. In addition to checking your potassium level, your health care provider may also check other lab tests. TREATMENT Hypokalemia can be treated with potassium supplements taken by mouth or adjustments in your current medicines. If your potassium level is very low, you may need to get potassium through a vein (IV) and be monitored in the hospital. A diet high in potassium is also helpful. Foods high in potassium are:  Nuts, such as peanuts and pistachios.  Seeds, such as sunflower seeds and pumpkin seeds.  Peas, lentils, and lima beans.  Whole grain and bran cereals and breads.  Fresh fruit and vegetables, such as apricots, avocado, bananas, cantaloupe, kiwi, oranges, tomatoes, asparagus, and potatoes.  Orange and tomato juices.  Red meats.  Fruit yogurt. HOME CARE INSTRUCTIONS  Take all medicines as prescribed by your health care provider.  Maintain a healthy diet by including nutritious food, such as fruits, vegetables, nuts, whole grains, and lean meats.  If you are taking a laxative, be sure to follow the directions on the label. SEEK MEDICAL CARE IF:  Your weakness gets worse.  You feel your heart pounding or racing.  You are vomiting or having diarrhea.  You are diabetic and having trouble keeping your blood glucose in the normal range. SEEK IMMEDIATE MEDICAL CARE IF:  You have chest pain,  shortness of breath, or dizziness.  You are vomiting or having diarrhea for more than 2 days.  You faint. MAKE SURE YOU:   Understand these instructions.  Will watch your condition.  Will get help right away if you are not doing well or get worse. Document Released: 03/10/2005 Document Revised: 12/29/2012 Document Reviewed: 09/10/2012 [facility] Patient Information 2015 [place], [place]. This information is not intended to replace advice given to you by your health care provider. Make sure you discuss any questions you have with your health care provider.

## 2014-05-02 NOTE — ED Provider Notes (Signed)
CSN: [id number]     Arrival date & time    History  This chart was scribed for No att. providers found by Edison Simon, ED Scribe. This patient was seen in room APA10/APA10 and the patient's care was started at 1:56 PM.    Chief Complaint  Patient presents with  . Weakness  . Diarrhea   The history is provided by the patient and the spouse. No language interpreter was used.    HPI Comments: Charles [name] is a 74 y.o. male who presents to the Emergency Department complaining of generalized weakness and diarrhea with onset 3 days ago. Wife states his diarrhea smells "awful." Wife notes he had chemotherapy 4 days ago and another 5 days ago. She states he has not had diarrhea status post chemotherapy before. She states he received blood after last chemotherapy treatment. Wife notes he has been fatigued. He also complains of left knee pain. She states he has been tolerating PO intake and has been drinking fluids. Wife states he is taking $RemoveB'15mg'uLhlbqRz$  morphine BID, and his doctors plan to soon increase dosage to $RemoveB'30mg'vxmsPUwb$  BID. She states he also has Hydrocodone 10 for breakthrough pain and last used 1 this morning. He denies nausea or vomiting.  Past Medical History  Diagnosis Date  . Essential hypertension, benign   . Atrial fibrillation 10/2008  . 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 Peptic ulcer disease   . Degenerative joint disease     Knees and shoulders  . Pneumonia 2010  . Allergic rhinitis   . Anxiety   . Drug abuse   . Tobacco abuse     50 pack years  . Mitral regurgitation     Moderate to severe May 2014  . COPD (chronic obstructive pulmonary disease)   . Hernia, inguinal, left   . Arm fracture, right 07/2013  . Hernia, inguinal, left   . Shortness of breath   . Dysrhythmia   . CHF (congestive heart failure)   . Follicular lymphoma 04/24/2246  . Basal cell carcinoma    Past Surgical History  Procedure Laterality Date  .  Neck lesion biopsy  June 23, 2498    Follicular lymphoma  . Skin cancer excision      under left breast; variously described as melanoma and basal cell  . Rotator cuff repair      right  . Patella fracture surgery Left 1975  . Hematoma evacuation      after melanoma removal  . Portacath placement  07/12/2010  . Colonoscopy  2009    Also underwent an EGD; patient reports no significant findings  . Bone marrow aspiration  06/2012  . Bone marrow biopsy  06/2012  . Fungal infection    . Port-a-cath removal Left 01/17/2013    Procedure: REMOVAL PORT-A-CATH;  Surgeon: Scherry Ran, MD;  Location: [facility];  Service: General;  Laterality: Left;  . Inguinal hernia repair Left 12/16/2013    Procedure: HERNIA REPAIR INGUINAL ADULT;  Surgeon: Scherry Ran, MD;  Location: [facility];  Service: General;  Laterality: Left;  Marland Kitchen Mass biopsy Right 12/16/2013    Procedure: FINE NEEDLE ASPIRATION NECK MASS;  Surgeon: Scherry Ran, MD;  Location: [facility];  Service: General;  Laterality: Right;  . Mass biopsy Right 01/31/2014    Procedure: INCISIONAL BIOPSY OF RIGHT NECK MASS ;  Surgeon: Ascencion Dike, MD;  Location: [facility];  Service: ENT;  Laterality: Right;   Family History  Problem Relation Age of Onset  . Heart attack Father   . Coronary artery disease Other    History  Substance Use Topics  . Smoking status: Former Smoker -- 1.00 packs/day for 50 years    Types: Cigarettes    Quit date: 04/12/2012  . Smokeless tobacco: Never Used  . Alcohol Use: No    Review of Systems  Constitutional: Positive for fatigue. Negative for appetite change.  Gastrointestinal: Positive for diarrhea. Negative for nausea and vomiting.  Musculoskeletal:       Left knee pain  Allergic/Immunologic: Positive for immunocompromised state.  Neurological: Positive for weakness.  All other systems reviewed and are negative.     Allergies  Metoprolol and Statins  Home Medications   Prior to  Admission medications   Medication Sig Start Date End Date Taking? Authorizing Provider  acyclovir (ZOVIRAX) 400 MG tablet Take 1 tablet (400 mg total) by mouth daily. 03/28/14  Yes Molli Hazard, MD  ALPRAZolam Duanne Moron) 1 MG tablet Take 0.5-1 mg by mouth 3 (three) times daily. Takes one half table three times daily and one whole tablet at bedtime   Yes Historical Provider, MD  calcium carbonate (OS-CAL) 600 MG TABS tablet Take 600 mg by mouth 2 (two) times daily with a meal.   Yes Historical Provider, MD  cholecalciferol (VITAMIN D) 1000 UNITS tablet Take 1,000 Units by mouth every morning.   Yes Historical Provider, MD  digoxin (LANOXIN) 0.125 MG tablet Take 0.125 mg by mouth every morning.   Yes Historical Provider, MD  diltiazem (CARDIZEM CD) 180 MG 24 hr capsule Take 1 capsule (180 mg total) by mouth daily. 06/08/13  Yes Lendon Colonel, NP  furosemide (LASIX) 80 MG tablet Take 80 mg by mouth 2 (two) times daily.   Yes Historical Provider, MD  HYDROcodone-acetaminophen (NORCO) 10-325 MG per tablet Take 1 tablet by mouth every 6 (six) hours as needed for moderate pain.   Yes Historical Provider, MD  lovastatin (MEVACOR) 10 MG tablet Take 10 mg by mouth at bedtime.    Yes Historical Provider, MD  morphine (MS CONTIN) 15 MG 12 hr tablet Take 15 mg by mouth every 12 (twelve) hours. Takes in the morning and a HS   Yes Historical Provider, MD  omeprazole (PRILOSEC) 20 MG capsule Take 20 mg by mouth every morning.    Yes Historical Provider, MD  ondansetron (ZOFRAN) 8 MG tablet Take 1 tablet (8 mg total) by mouth every 8 (eight) hours as needed for nausea or vomiting. 03/28/14  Yes Molli Hazard, MD  polyethylene glycol (MIRALAX / GLYCOLAX) packet Take 17 g by mouth daily as needed for mild constipation or moderate constipation. Takes 3 times a week   Yes Historical Provider, MD  PROAIR HFA 108 (90 BASE) MCG/ACT inhaler Inhale 1 puff into the lungs every 6 (six) hours as needed. For  shortness of breath 01/15/12  Yes Historical Provider, MD  rivaroxaban (XARELTO) 20 MG TABS tablet Take 20 mg by mouth daily with supper.   Yes Historical Provider, MD  sertraline (ZOLOFT) 50 MG tablet Take 50 mg by mouth at bedtime. 08/13/12  Yes Angus Ailene Ravel, MD  tiotropium (SPIRIVA) 18 MCG inhalation capsule Place 18 mcg into inhaler and inhale daily as needed (for shortness of breath).    Yes Historical Provider, MD  traMADol (ULTRAM) 50 MG tablet Take 50 mg by mouth every 6 (six) hours as needed (pain).  Yes Historical Provider, MD  acetaminophen (TYLENOL) 500 MG tablet Take 500 mg by mouth every 6 (six) hours as needed (pain).     Historical Provider, MD  BENDAMUSTINE HCL IV Inject into the vein. Day 1 & 2 every 28 days. To start on 03/30/14    Historical Provider, MD  potassium chloride SA (K-DUR,KLOR-CON) 20 MEQ tablet Take 1 tablet (20 mEq total) by mouth 3 (three) times daily. 05/02/14   Jasper Riling. Aragorn Recker, MD  Simethicone (PHAZYME PO) Take 1 capsule by mouth daily as needed (for gas relief/stomach symptoms).    Historical Provider, MD   BP 122/65 mmHg  Pulse 70  Temp(Src) 97.5 F (36.4 C) (Oral)  Resp 16  Wt 169 lb (76.658 kg)  SpO2 98% Physical Exam  Constitutional: He is oriented to person, place, and time. He appears well-developed and well-nourished.  Looks ucomfortable  HENT:  Head: Normocephalic and atraumatic.  Eyes: Conjunctivae are normal.  Neck: Normal range of motion. Neck supple.  Cardiovascular: Normal rate, regular rhythm and normal heart sounds.   No murmur heard. Pulmonary/Chest: Effort normal and breath sounds normal. No respiratory distress. He has no wheezes. He has no rales.  Abdominal: Soft. There is no tenderness.  Musculoskeletal: Normal range of motion. He exhibits edema (Bilateral lower extremity pitting edema).  Swollen tender left knee with some effusion, no erythema  Neurological: He is alert and oriented to person, place, and time.  Skin: Skin  is warm and dry.  Psychiatric: He has a normal mood and affect.  Nursing note and vitals reviewed.   ED Course  Procedures    COORDINATION OF CARE: 2:01 PM Discussed treatment plan with patient at beside, the patient agrees with the plan and has no further questions at this time.   Labs Review Labs Reviewed  COMPREHENSIVE METABOLIC PANEL - Abnormal; Notable for the following:    Potassium 3.1 (*)    CO2 36 (*)    Glucose, Bld 132 (*)    BUN 35 (*)    Albumin 3.3 (*)    GFR calc non Af Amer 64 (*)    GFR calc Af Amer 74 (*)    All other components within normal limits  CBC WITH DIFFERENTIAL/PLATELET - Abnormal; Notable for the following:    RBC 3.66 (*)    Hemoglobin 10.4 (*)    HCT 32.7 (*)    RDW 18.1 (*)    Neutrophils Relative % 84 (*)    Lymphocytes Relative 2 (*)    Lymphs Abs 0.1 (*)    Eosinophils Relative 7 (*)    All other components within normal limits  URINALYSIS, ROUTINE W REFLEX MICROSCOPIC - Abnormal; Notable for the following:    Urobilinogen, UA 4.0 (*)    All other components within normal limits  LIPASE, BLOOD  DIGOXIN LEVEL    Imaging Review No results found.   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 Has been on treatment for his lymphoma. No further diarrhea while in ER. Mild hypokalemia but patient feels much better after IV fluids. Unable to get stool samples since he had no further diarrhea in the ED. Lab work overall reassuring. Will discharge home to follow-up as needed.  I personally performed the services described in this documentation, which was scribed in my presence. The recorded information has been reviewed and is accurate.  Jasper Riling. Alvino Chapel, MD 05/02/14 2155

## 2014-05-02 NOTE — ED Notes (Signed)
Blood drawn from PICC, fluids being infused, all without difficulty.

## 2014-05-03 ENCOUNTER — Telehealth (HOSPITAL_COMMUNITY): Payer: Self-pay | Admitting: Oncology

## 2014-05-03 NOTE — Telephone Encounter (Signed)
Kerrin Champagne contacted, spoke to [place] instead - per Irine Seal is feeling much better.  Diarrhea has stopped.  He is taking POs adequately.  Potassium received and started today.  Lab appt cancelled for tomorrow.  Keeping future appts as scheduled and Romie Minus verbalized understanding of all and to contact us if concerns.

## 2014-05-04 ENCOUNTER — Other Ambulatory Visit (HOSPITAL_COMMUNITY): Payer: Medicare Other

## 2014-05-10 ENCOUNTER — Encounter (HOSPITAL_BASED_OUTPATIENT_CLINIC_OR_DEPARTMENT_OTHER): Payer: Medicare Other | Admitting: Hematology & Oncology

## 2014-05-10 ENCOUNTER — Encounter (HOSPITAL_COMMUNITY): Payer: Medicare Other

## 2014-05-10 ENCOUNTER — Encounter (HOSPITAL_COMMUNITY): Payer: Self-pay | Admitting: Hematology & Oncology

## 2014-05-10 VITALS — BP 105/35 | HR 78 | Temp 97.6°F | Resp 20 | Wt 169.0 lb

## 2014-05-10 DIAGNOSIS — D630 Anemia in neoplastic disease: Secondary | ICD-10-CM

## 2014-05-10 DIAGNOSIS — C8510 Unspecified B-cell lymphoma, unspecified site: Secondary | ICD-10-CM

## 2014-05-10 DIAGNOSIS — C8581 Other specified types of non-Hodgkin lymphoma, lymph nodes of head, face, and neck: Secondary | ICD-10-CM

## 2014-05-10 DIAGNOSIS — C8331 Diffuse large B-cell lymphoma, lymph nodes of head, face, and neck: Secondary | ICD-10-CM | POA: Diagnosis not present

## 2014-05-10 DIAGNOSIS — C8290 Follicular lymphoma, unspecified, unspecified site: Secondary | ICD-10-CM | POA: Diagnosis not present

## 2014-05-10 LAB — CBC WITH DIFFERENTIAL/PLATELET
BASOS ABS: 0.1 10*3/uL (ref 0.0–0.1)
Basophils Relative: 1 % (ref 0–1)
Eosinophils Absolute: 0.2 10*3/uL (ref 0.0–0.7)
Eosinophils Relative: 2 % (ref 0–5)
HEMATOCRIT: 29.6 % — AB (ref 39.0–52.0)
HEMOGLOBIN: 9.4 g/dL — AB (ref 13.0–17.0)
LYMPHS PCT: 3 % — AB (ref 12–46)
Lymphs Abs: 0.2 10*3/uL — ABNORMAL LOW (ref 0.7–4.0)
MCH: 28.6 pg (ref 26.0–34.0)
MCHC: 31.8 g/dL (ref 30.0–36.0)
MCV: 90 fL (ref 78.0–100.0)
MONOS PCT: 7 % (ref 3–12)
Monocytes Absolute: 0.6 10*3/uL (ref 0.1–1.0)
NEUTROS ABS: 6.8 10*3/uL (ref 1.7–7.7)
Neutrophils Relative %: 87 % — ABNORMAL HIGH (ref 43–77)
Platelets: 228 10*3/uL (ref 150–400)
RBC: 3.29 MIL/uL — ABNORMAL LOW (ref 4.22–5.81)
RDW: 17.9 % — AB (ref 11.5–15.5)
WBC: 7.9 10*3/uL (ref 4.0–10.5)

## 2014-05-10 LAB — COMPREHENSIVE METABOLIC PANEL
ALBUMIN: 3.2 g/dL — AB (ref 3.5–5.2)
ALT: 19 U/L (ref 0–53)
AST: 25 U/L (ref 0–37)
Alkaline Phosphatase: 81 U/L (ref 39–117)
Anion gap: 6 (ref 5–15)
BUN: 27 mg/dL — AB (ref 6–23)
CALCIUM: 8.9 mg/dL (ref 8.4–10.5)
CO2: 33 mmol/L — ABNORMAL HIGH (ref 19–32)
Chloride: 96 mmol/L (ref 96–112)
Creatinine, Ser: 1.11 mg/dL (ref 0.50–1.35)
GFR calc Af Amer: 74 mL/min — ABNORMAL LOW (ref >90)
GFR calc non Af Amer: 63 mL/min — ABNORMAL LOW (ref >90)
GLUCOSE: 115 mg/dL — AB (ref 70–99)
POTASSIUM: 3.7 mmol/L (ref 3.5–5.1)
Sodium: 135 mmol/L (ref 135–145)
TOTAL PROTEIN: 7 g/dL (ref 6.0–8.3)
Total Bilirubin: 0.9 mg/dL (ref 0.3–1.2)

## 2014-05-10 LAB — LACTATE DEHYDROGENASE: LDH: 216 U/L (ref 94–250)

## 2014-05-10 MED ORDER — HEPARIN SOD (PORK) LOCK FLUSH 100 UNIT/ML IV SOLN: 500.0000 [IU] | Freq: Once | INTRAVENOUS | Status: DC

## 2014-05-10 MED ORDER — HEPARIN SOD (PORK) LOCK FLUSH 100 UNIT/ML IV SOLN
INTRAVENOUS | Status: AC
  Filled 2014-05-10: qty 5

## 2014-05-10 MED ORDER — HEPARIN SOD (PORK) LOCK FLUSH 100 UNIT/ML IV SOLN
500.0000 [IU] | Freq: Once | INTRAVENOUS | Status: AC
  Administered 2014-05-10: 250 [IU] via INTRAVENOUS

## 2014-05-10 MED ORDER — SODIUM CHLORIDE 0.9 % IJ SOLN
10.0000 mL | INTRAMUSCULAR | Status: DC | PRN
  Administered 2014-05-10: 10 mL via INTRAVENOUS
  Filled 2014-05-10: qty 10

## 2014-05-10 NOTE — Progress Notes (Signed)
[name], MD 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   06/07/2010 Initial Diagnosis FNA of right neck mass concerning for lymphoma.   06/24/2010 Procedure Excisional lymph node biopsy- GB20 NHL, follicular type, grade III.   07/09/2010 Bone Marrow Biopsy Bone marrow aspiration and biopsy- rare minute atypical lymphoid aggregates, cellular marrow with complete trilineage hematopoiesis, no blasts found on peripheral blood   07/11/2010 Imaging PET- confluent right cervical lymphadenopathy with a foci within the skeleton and dominant left iliac lesions   07/17/2010 - 11/05/2010 Chemotherapy Bendamustine + Rituxan x 6 cycles   09/12/2010 Remission PET- Interval complete metabolic response to therapy    11/20/2010 Imaging PET- No evidence of residual or recurrent hypermetabolic lymphoma.    02/17/2011 - 12/09/2011 Chemotherapy Rituxan every 90 days maintenance x 4.  D/C'd due to pseudomonas bacteremia   06/06/2011 Imaging PET- No specific features identified to suggest residual or recurrence of tumor.   01/30/2012 Adverse Reaction Pseudomonas bacteremia   06/22/2012 Imaging PET- No findings to suggest lymphomatous involvement in the neck, chest, abdomen, or pelvis.   07/06/2013 Imaging CT CAP- Stable mediastinal and hilar lymph nodes. No findings for lymphoma in the abdomen or pelvis.   11/14/2013 Imaging CT soft tissues of the neck revealed bulky right cervical lymphadenopathy with stable mediastinal and hilar disease.   12/16/2013 Surgery fine-needle aspirate right neck nondiagnostic.   01/31/2014 Surgery Biopsy right cervical lymph node by Dr.Teoh consistent with diffuse large B-cell lymphoma.    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 CD 20 NEGATIVE  Open biopsy of deep R neck mass by Leta Baptist, MD   02/21/2014 PET scan Hypermetabolic lymphoma in the R neck involving stations 2 - 4. Progressive cardiac enlargement suspicious for cardiomyopathy   03/29/2014 -  Chemotherapy Bendamustine on days 1 and 2 every 28 days   CURRENT THERAPY: Single agent Treanda  INTERVAL HISTORY: Charles [name] 74 y.o. male returns for follow-up of his DLBCL. He has completed therapy with Treanda. He did better than anticipated. He did require one packed red cell transfusion. He went to the emergency department on February 9 with diarrhea, but it has resolved. He denies fever, chills, nausea or vomiting. His cardiac issues continue to be his limiting factor. He complains his dentures occasionally irritate his gums. He is considering proceeding forward with radiation therapy.  Past Medical History  Diagnosis Date  . Essential hypertension, benign   . Atrial fibrillation 10/2008  . 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 Peptic ulcer disease   . Degenerative joint disease     Knees and shoulders  . Pneumonia 2010  . Allergic rhinitis   . Anxiety   . Drug abuse   . Tobacco abuse     50 pack years  . Mitral regurgitation     Moderate to severe May 2014  . COPD (chronic obstructive pulmonary disease)   . Hernia, inguinal, left   . Arm fracture, right 07/2013  . Hernia, inguinal, left   . Shortness of breath   . Dysrhythmia   . CHF (congestive heart failure)   . Follicular lymphoma 1/0/0712  . 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       is allergic to metoprolol and statins.  We administered heparin lock flush and sodium chloride.  Past Surgical History  Procedure Laterality Date  . Neck lesion biopsy  June 24, 7987    Follicular lymphoma  . Skin cancer excision      under left breast; variously described as melanoma and basal cell  . Rotator cuff repair      right  . Patella fracture surgery Left 1975  . Hematoma evacuation      after melanoma removal  . Portacath placement  07/12/2010  . Colonoscopy  2009    Also underwent an EGD; patient reports no significant findings  . Bone marrow aspiration  06/2012  . Bone marrow biopsy  06/2012  . Fungal infection    . Port-a-cath removal Left 01/17/2013    Procedure: REMOVAL PORT-A-CATH;  Surgeon: Scherry Ran, MD;  Location: [facility];  Service: General;  Laterality: Left;  . Inguinal hernia repair Left 12/16/2013    Procedure: HERNIA REPAIR INGUINAL ADULT;  Surgeon: Scherry Ran, MD;  Location: [facility];  Service: General;  Laterality: Left;  Marland Kitchen Mass biopsy Right 12/16/2013    Procedure: FINE NEEDLE ASPIRATION NECK MASS;  Surgeon: Scherry Ran, MD;  Location: [facility];  Service: General;  Laterality: Right;  . Mass biopsy Right 01/31/2014    Procedure: INCISIONAL BIOPSY OF RIGHT NECK MASS ;  Surgeon: Ascencion Dike, MD;  Location: [place];  Service: ENT;  Laterality: Right;    Review of Systems  Constitutional: Positive for malaise/fatigue.  HENT: Negative.   Eyes: Negative.   Respiratory: Positive for shortness of breath.   Cardiovascular: Positive for leg swelling.  Gastrointestinal: Negative.   Genitourinary: Negative.   Musculoskeletal: Positive for back pain and joint pain.  Skin: Negative.     Neurological: Positive for weakness. Negative for sensory change, speech change, focal weakness, seizures and loss of consciousness.  Endo/Heme/Allergies: Negative.   Psychiatric/Behavioral: Negative.     PHYSICAL EXAMINATION  ECOG PERFORMANCE STATUS: 2 - Symptomatic, <50% confined to bed  Filed Vitals:   05/10/14 1100  BP: 105/35  Pulse: 78  Temp: 97.6 F (36.4 C)  Resp: 20    Physical Exam  Constitutional: He is oriented to person, place, and time and well-developed, well-nourished, and in no distress. No distress.  HENT:  Head: Normocephalic and atraumatic.  Mouth/Throat: No oropharyngeal exudate.  Eyes: Conjunctivae and EOM are normal. Pupils are equal, round, and reactive to light. No scleral icterus.  Neck: Normal range of motion. Neck supple. No tracheal deviation present. No thyromegaly present.  R neck mass no longer palpable   Cardiovascular: Normal rate and normal heart sounds.  Exam reveals no gallop.   Pulmonary/Chest: Effort normal.  Wearing O2  Abdominal: Soft. Bowel sounds are normal. He exhibits no mass. There is no tenderness. There is no rebound and no guarding.  Musculoskeletal: He exhibits edema. He exhibits no tenderness.  Neurological: He is alert and oriented to person, place, and time. No cranial nerve deficit.  Skin: Skin is warm and dry. No erythema.  Psychiatric: Mood normal.    LABORATORY DATA:  CBC    Component Value Date/Time   WBC 7.9 05/10/2014 1200   RBC 3.29* 05/10/2014 1200   RBC 4.07* 11/08/2013 1530   HGB 9.4* 05/10/2014 1200   HCT 29.6* 05/10/2014 1200   PLT 228 05/10/2014 1200   MCV 90.0 05/10/2014 1200   MCH 28.6 05/10/2014 1200   MCHC 31.8 05/10/2014 1200   RDW 17.9* 05/10/2014 1200   LYMPHSABS 0.2* 05/10/2014 1200   MONOABS 0.6 05/10/2014 1200   EOSABS 0.2 05/10/2014 1200   BASOSABS 0.1 05/10/2014 1200   CMP     Component Value Date/Time   NA 135 05/10/2014 1200   K 3.7 05/10/2014 1200   CL 96 05/10/2014 1200    CO2 33* 05/10/2014 1200   GLUCOSE 115* 05/10/2014 1200   BUN 27* 05/10/2014 1200   CREATININE 1.11 05/10/2014 1200   CREATININE 1.68* 10/30/2012 1112   CALCIUM 8.9 05/10/2014 1200   PROT 7.0 05/10/2014 1200   ALBUMIN 3.2* 05/10/2014 1200   AST 25 05/10/2014 1200   ALT 19 05/10/2014 1200   ALKPHOS 81 05/10/2014 1200   BILITOT 0.9 05/10/2014 1200   GFRNONAA 63* 05/10/2014 1200   GFRAA 74* 05/10/2014 1200     ASSESSMENT and THERAPY PLAN:    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 He has a history of follicular lymphoma but unfortunately transformed to diffuse large B-cell lymphoma. Disease was limited to the neck. He was to be treated and was given single agent Treanda with surprisingly good tolerance. He is going to move forward with consolidative radiation. He has an underlying anemia which I feel is multifactorial, but certainly treatment related. We will follow his counts moving forward. We will plan on seeing him back in the next 6-8 weeks. If he has additional problems or concerns prior to follow-up I have encouraged him to call.   All questions were answered. The patient knows to call the clinic with any problems, questions or concerns. We can certainly see the patient much sooner if necessary.   Molli Hazard 05/30/2014

## 2014-05-10 NOTE — Progress Notes (Signed)
See office encounter for information.

## 2014-05-10 NOTE — Progress Notes (Signed)
Kerrin Champagne presented for PICC flush and blood collection.  See IV assessment in docflowsheets for PICC details.  Proper placement of PICC confirmed by CXR.  PICC located right arm.  Good blood return present. PICC flushed with 80ml NS and 250U Heparin, see MAR for further details.  Rolley Sims Rovito tolerated procedure well and without incident.

## 2014-05-10 NOTE — Patient Instructions (Signed)
[place] at [facility]  Discharge Instructions:  You seen Dr Whitney Muse today.  Follow up in 2 months.  You had lab work today.  Keep chemotherapy appt as scheduled.  Please call the clinic if you have any questions or concerns  _______________________________________________________________  Thank you for choosing [place] at [facility] to provide your oncology and hematology care.  To afford each patient quality time with our providers, please arrive at least 15 minutes before your scheduled appointment.  You need to re-schedule your appointment if you arrive 10 or more minutes late.  We strive to give you quality time with our providers, and arriving late affects you and other patients whose appointments are after yours.  Also, if you no show three or more times for appointments you may be dismissed from the clinic.  Again, thank you for choosing [place] at [facility] hope is that these requests will allow you access to exceptional care and in a timely manner. _______________________________________________________________  If you have questions after your visit, please contact our office at [phone] between the hours of 8:30 a.m. and 5:00 p.m. Voicemails left after 4:30 p.m. will not be returned until the following business day. _______________________________________________________________  For prescription refill requests, have your pharmacy contact our office. _______________________________________________________________  Recommendations made by the consultant and any test results will be sent to your referring physician. _______________________________________________________________

## 2014-05-25 ENCOUNTER — Inpatient Hospital Stay (HOSPITAL_COMMUNITY): Payer: Medicare Other

## 2014-05-26 ENCOUNTER — Inpatient Hospital Stay (HOSPITAL_COMMUNITY): Payer: Medicare Other

## 2014-05-30 NOTE — Assessment & Plan Note (Signed)
74 year old male who is on hospice secondary to underlying cardiac disease. He has a history of follicular lymphoma but unfortunately transformed to diffuse large B-cell lymphoma. Disease was limited to the neck. He was to be treated and was given single agent Treanda with surprisingly good tolerance. He is going to move forward with consolidative radiation. He has an underlying anemia which I feel is multifactorial, but certainly treatment related. We will follow his counts moving forward. We will plan on seeing him back in the next 6-8 weeks. If he has additional problems or concerns prior to follow-up I have encouraged him to call.

## 2014-06-16 ENCOUNTER — Ambulatory Visit: Payer: Medicare Other | Admitting: Cardiology

## 2014-07-10 ENCOUNTER — Ambulatory Visit (HOSPITAL_COMMUNITY): Payer: Medicare Other | Admitting: Hematology & Oncology

## 2014-09-26 ENCOUNTER — Telehealth: Payer: Self-pay | Admitting: Cardiology

## 2014-09-26 NOTE — Telephone Encounter (Signed)
Noted. I presume this means he is declining clinically in Hospice with large B-cell lymphoma. Cardiac status may also worsen further off medications (increased risk of CHF and stroke).

## 2014-09-26 NOTE — Telephone Encounter (Signed)
Per Hospice Nurse Janett Billow Byrd--pt's wife has decided to discontinue all of the pt's heart medication. Janett Billow wanted Dr Domenic Polite and his nurse to be aware

## 2014-09-26 NOTE — Telephone Encounter (Signed)
Forwarded to [place]

## 2014-10-23 DEATH — deceased

## 2015-03-21 ENCOUNTER — Other Ambulatory Visit: Payer: Self-pay | Admitting: Nurse Practitioner

## 2015-11-27 IMAGING — CR DG CHEST 2V
2 series · 2 of 2 positions shown · non-contrast
Comparison: DG CHEST 1V PORT dated 05/23/2013; DG CHEST 2 VIEW dated
03/22/2013; CT ABDOMEN W/CM dated 01/03/2013

CLINICAL DATA: Shortness of breath.

EXAM:
CHEST - 2 VIEW

[view not recorded (1 of 2)]
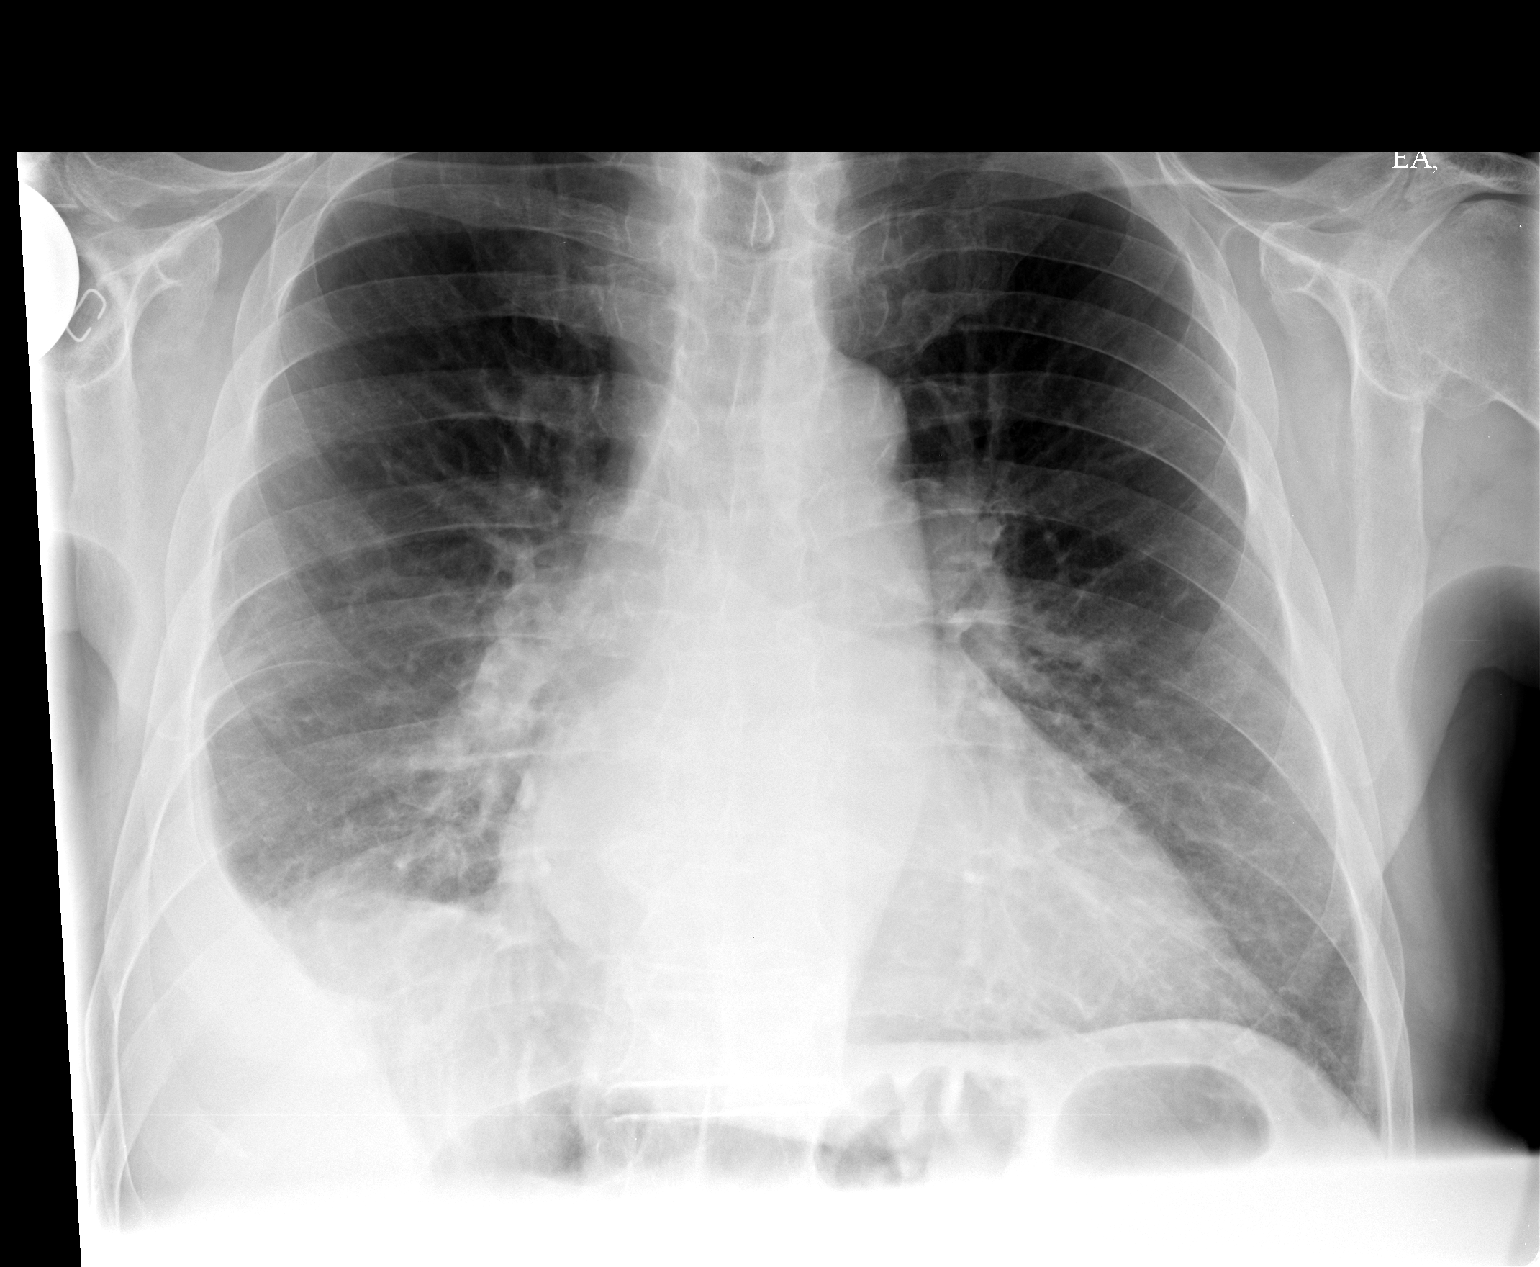

[view not recorded (2 of 2)]
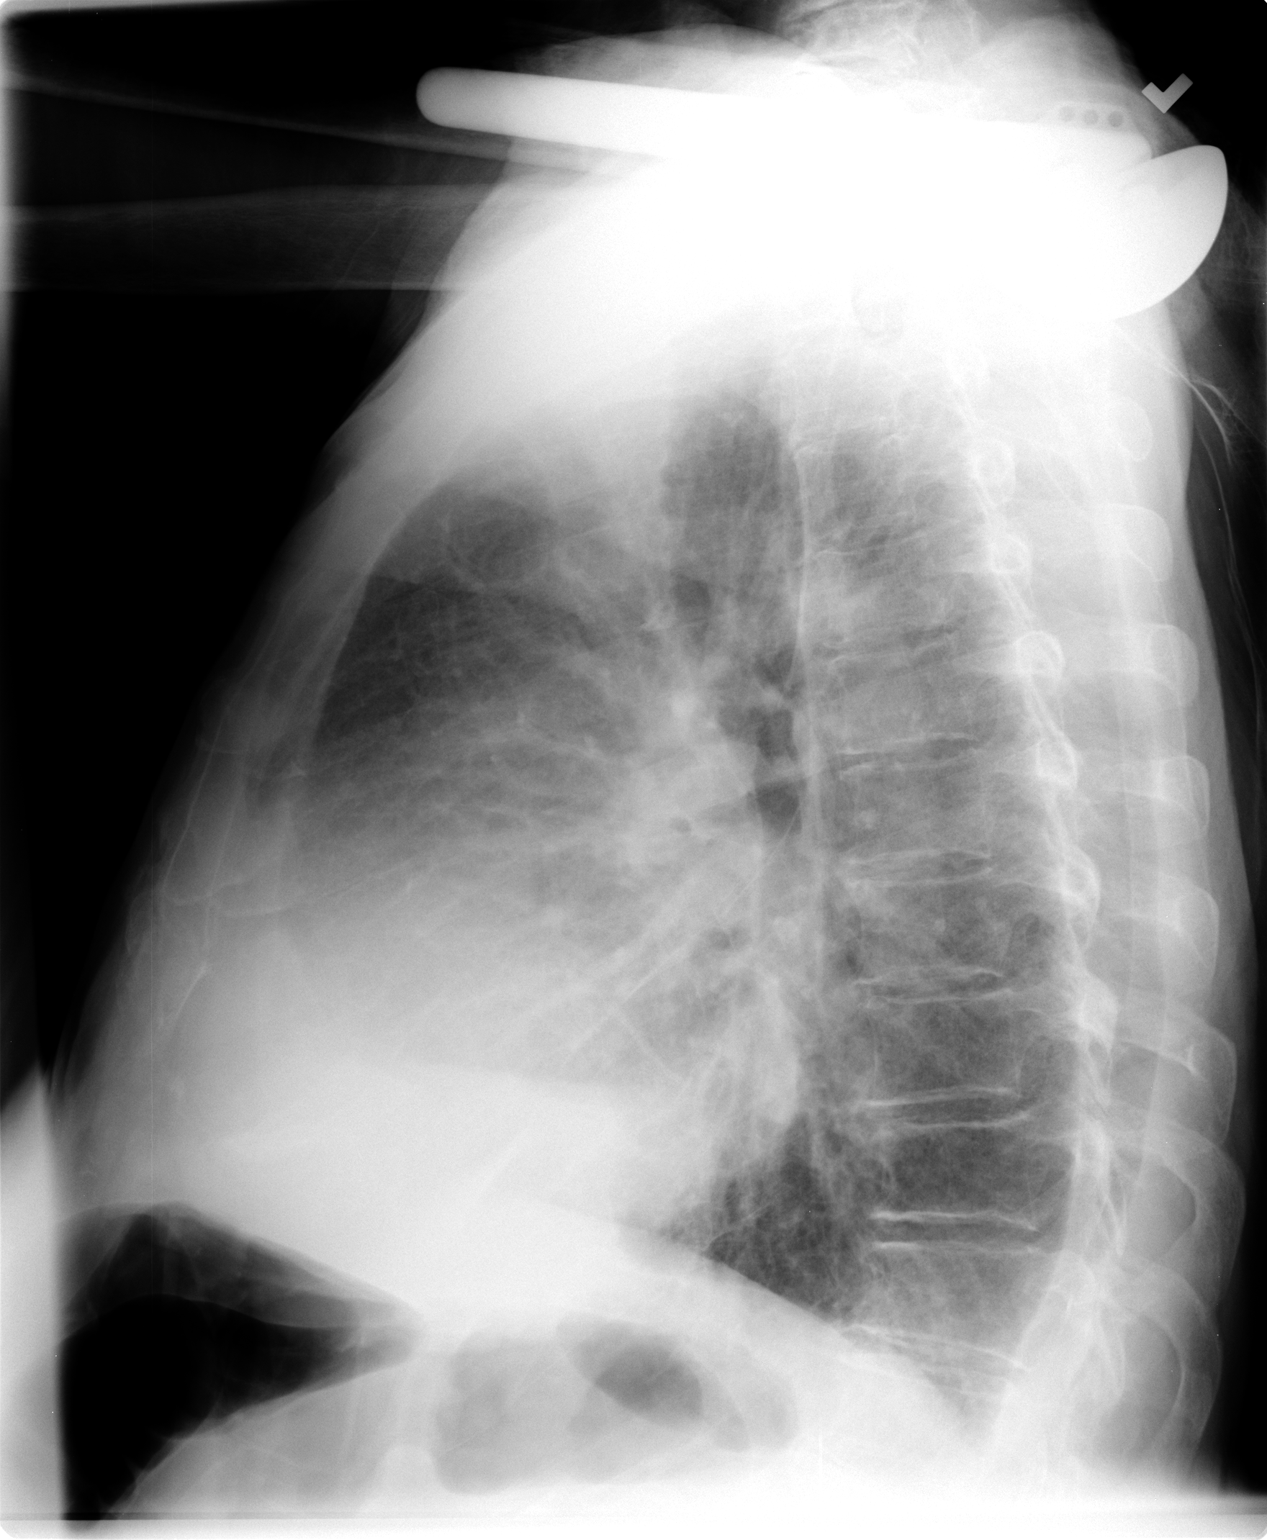

[2 of 2 positions shown; findings below may reference images not displayed]

FINDINGS: There is interval enlargement of a right pleural effusion which may
be partially loculated anterolaterally based on x-ray. There is
associated atelectasis at the right lung base and potentially subtle
infiltrate present. Stable chronic lung disease and cardiomegaly.
Stable moderate-sized hiatal hernia. No pneumothorax or pulmonary
edema identified. The visualized skeletal structures are
unremarkable.
IMPRESSION: Enlargement of right pleural effusion which is now moderate in
volume and may be partially loculated anterolaterally based on chest
x-ray. There may be associated infiltrate in the right lower lung as
well.

## 2015-11-28 IMAGING — CR DG CHEST 1V
1 series · 1 of 1 positions shown · non-contrast
Comparison: July 06, 2013

CLINICAL DATA: Congestive heart failure

EXAM:
CHEST - 1 VIEW

[ap]
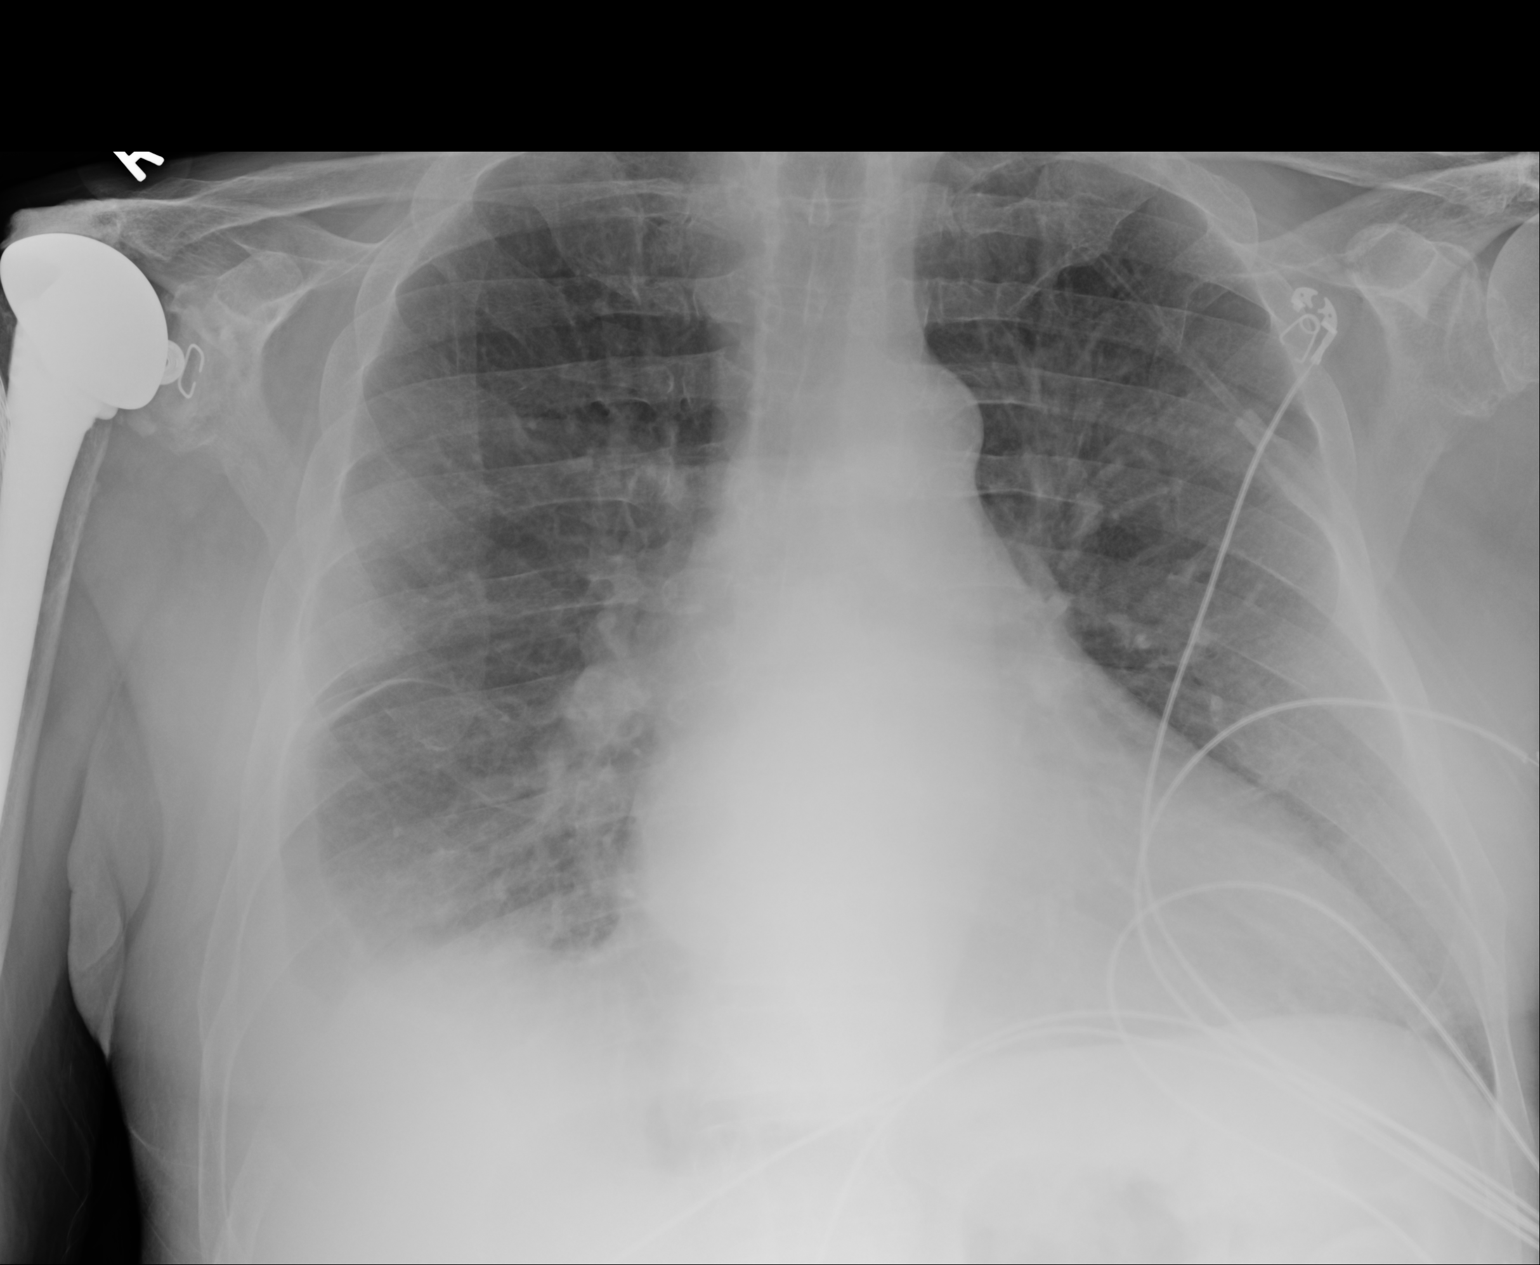

[1 of 1 positions shown; findings below may reference images not displayed]

FINDINGS: The heart size and mediastinal contours are stable. The heart size
is enlarged. Small right pleural effusion with consolidation of
right lung base is unchanged. There is cephalization of flow without
frank pulmonary edema. The visualized skeletal structures are
stable.
IMPRESSION: Persistent small right pleural effusion with consolidation of right
lung base unchanged compared to prior exam. There is cephalization
of flow without frank evidence of pulmonary edema.
